# Patient Record
Sex: Male | Born: 1950 | Race: White | Hispanic: No | Smoking: Current every day smoker
Health system: Southern US, Community
[De-identification: ages and names within clinical notes are randomized; demographics above are authoritative.]

## PROBLEM LIST (undated history)

## (undated) DIAGNOSIS — I10 Essential (primary) hypertension: Secondary | ICD-10-CM

## (undated) DIAGNOSIS — J449 Chronic obstructive pulmonary disease, unspecified: Secondary | ICD-10-CM

## (undated) DIAGNOSIS — Z9981 Dependence on supplemental oxygen: Secondary | ICD-10-CM

## (undated) DIAGNOSIS — K219 Gastro-esophageal reflux disease without esophagitis: Secondary | ICD-10-CM

## (undated) DIAGNOSIS — J45909 Unspecified asthma, uncomplicated: Secondary | ICD-10-CM

## (undated) DIAGNOSIS — F17200 Nicotine dependence, unspecified, uncomplicated: Secondary | ICD-10-CM

## (undated) HISTORY — PX: HERNIA REPAIR: SHX51

## (undated) HISTORY — PX: NECK SURGERY: SHX720

---

## 2003-07-02 ENCOUNTER — Other Ambulatory Visit: Payer: Self-pay

## 2005-11-21 ENCOUNTER — Emergency Department: Payer: Self-pay | Admitting: Emergency Medicine

## 2009-06-18 ENCOUNTER — Emergency Department: Payer: Self-pay | Admitting: Emergency Medicine

## 2012-04-20 LAB — COMPREHENSIVE METABOLIC PANEL
BUN: 11 mg/dL (ref 7–18)
Chloride: 110 mmol/L — ABNORMAL HIGH (ref 98–107)
EGFR (African American): >60
EGFR (Non-African Amer.): >60
SGOT(AST): 36 U/L (ref 15–37)
SGPT (ALT): 39 U/L (ref 12–78)
Sodium: 141 mmol/L (ref 136–145)
Total Protein: 7.6 g/dL (ref 6.4–8.2)

## 2012-04-20 LAB — CBC
HCT: 42.2 % (ref 40.0–52.0)
HGB: 14.4 g/dL (ref 13.0–18.0)
MCH: 31.3 pg (ref 26.0–34.0)
MCHC: 34.1 g/dL (ref 32.0–36.0)
MCV: 92 fL (ref 80–100)
RBC: 4.61 10*6/uL (ref 4.40–5.90)
RDW: 13.6 % (ref 11.5–14.5)
WBC: 9.5 10*3/uL (ref 3.8–10.6)

## 2012-04-20 LAB — SEDIMENTATION RATE: Erythrocyte Sed Rate: 6 mm/hr (ref 0–20)

## 2012-04-20 LAB — TROPONIN I
Troponin-I: <0.02 ng/mL
Troponin-I: <0.02 ng/mL

## 2012-04-20 LAB — CK TOTAL AND CKMB (NOT AT ARMC)
CK, Total: 293 U/L — ABNORMAL HIGH (ref 35–232)
CK-MB: 6.3 ng/mL — ABNORMAL HIGH (ref 0.5–3.6)
CK-MB: 5.7 ng/mL — ABNORMAL HIGH (ref 0.5–3.6)

## 2012-04-20 LAB — APTT: Activated PTT: 30.4 secs (ref 23.6–35.9)

## 2012-04-20 LAB — PRO B NATRIURETIC PEPTIDE: B-Type Natriuretic Peptide: 21 pg/mL (ref 0–125)

## 2012-04-21 ENCOUNTER — Inpatient Hospital Stay: Payer: Self-pay | Admitting: Internal Medicine

## 2012-04-21 LAB — URINALYSIS, COMPLETE
Bacteria: NONE SEEN
Bilirubin,UR: NEGATIVE
Blood: NEGATIVE
Glucose,UR: NEGATIVE mg/dL (ref 0–75)
Leukocyte Esterase: NEGATIVE
Nitrite: NEGATIVE
Ph: 5 (ref 4.5–8.0)
Protein: NEGATIVE
RBC,UR: <1 /HPF (ref 0–5)

## 2012-04-22 LAB — BASIC METABOLIC PANEL
BUN: 19 mg/dL — ABNORMAL HIGH (ref 7–18)
Calcium, Total: 8.5 mg/dL (ref 8.5–10.1)
Creatinine: 0.86 mg/dL (ref 0.60–1.30)
EGFR (Non-African Amer.): >60
Glucose: 159 mg/dL — ABNORMAL HIGH (ref 65–99)
Potassium: 3.5 mmol/L (ref 3.5–5.1)
Sodium: 139 mmol/L (ref 136–145)

## 2012-08-04 ENCOUNTER — Inpatient Hospital Stay: Payer: Self-pay | Admitting: Family Medicine

## 2012-08-04 LAB — BASIC METABOLIC PANEL
Anion Gap: 3 — ABNORMAL LOW (ref 7–16)
BUN: 14 mg/dL (ref 7–18)
Chloride: 102 mmol/L (ref 98–107)
Co2: 31 mmol/L (ref 21–32)
Creatinine: 0.73 mg/dL (ref 0.60–1.30)
EGFR (Non-African Amer.): >60
Glucose: 97 mg/dL (ref 65–99)
Osmolality: 272 (ref 275–301)
Sodium: 136 mmol/L (ref 136–145)

## 2012-08-04 LAB — CK TOTAL AND CKMB (NOT AT ARMC): CK, Total: 134 U/L (ref 35–232)

## 2012-08-04 LAB — CBC
MCH: 31.2 pg (ref 26.0–34.0)
MCHC: 34.1 g/dL (ref 32.0–36.0)
WBC: 11.2 10*3/uL — ABNORMAL HIGH (ref 3.8–10.6)

## 2012-08-05 LAB — CBC WITH DIFFERENTIAL/PLATELET
Basophil #: 0 10*3/uL (ref 0.0–0.1)
Eosinophil %: 0 %
HCT: 39.6 % — ABNORMAL LOW (ref 40.0–52.0)
HGB: 13.3 g/dL (ref 13.0–18.0)
Lymphocyte #: 0.3 10*3/uL — ABNORMAL LOW (ref 1.0–3.6)
MCHC: 33.5 g/dL (ref 32.0–36.0)
Monocyte %: 0.7 %
Neutrophil %: 95.7 %
RBC: 4.38 10*6/uL — ABNORMAL LOW (ref 4.40–5.90)
WBC: 10.1 10*3/uL (ref 3.8–10.6)

## 2012-08-05 LAB — BASIC METABOLIC PANEL
BUN: 16 mg/dL (ref 7–18)
Co2: 27 mmol/L (ref 21–32)
Creatinine: 0.82 mg/dL (ref 0.60–1.30)
EGFR (African American): >60
EGFR (Non-African Amer.): >60
Glucose: 229 mg/dL — ABNORMAL HIGH (ref 65–99)
Osmolality: 275 (ref 275–301)

## 2012-08-07 LAB — CBC WITH DIFFERENTIAL/PLATELET
Basophil %: 0.1 %
Eosinophil %: 0 %
MCHC: 33.6 g/dL (ref 32.0–36.0)
MCV: 90 fL (ref 80–100)
Monocyte #: 0.4 x10 3/mm (ref 0.2–1.0)
RBC: 4.59 10*6/uL (ref 4.40–5.90)
RDW: 14.1 % (ref 11.5–14.5)

## 2012-08-07 LAB — BASIC METABOLIC PANEL
BUN: 23 mg/dL — ABNORMAL HIGH (ref 7–18)
Chloride: 97 mmol/L — ABNORMAL LOW (ref 98–107)
Creatinine: 0.8 mg/dL (ref 0.60–1.30)
Glucose: 166 mg/dL — ABNORMAL HIGH (ref 65–99)

## 2012-08-08 LAB — EXPECTORATED SPUTUM ASSESSMENT W GRAM STAIN, RFLX TO RESP C

## 2012-09-24 ENCOUNTER — Inpatient Hospital Stay: Payer: Self-pay | Admitting: Family Medicine

## 2012-09-24 LAB — BASIC METABOLIC PANEL
Anion Gap: 6 — ABNORMAL LOW (ref 7–16)
Calcium, Total: 9.5 mg/dL (ref 8.5–10.1)
Chloride: 103 mmol/L (ref 98–107)
Co2: 26 mmol/L (ref 21–32)
Creatinine: 0.54 mg/dL — ABNORMAL LOW (ref 0.60–1.30)
EGFR (Non-African Amer.): >60
Osmolality: 270 (ref 275–301)
Potassium: 4.1 mmol/L (ref 3.5–5.1)
Sodium: 135 mmol/L — ABNORMAL LOW (ref 136–145)

## 2012-09-24 LAB — CBC WITH DIFFERENTIAL/PLATELET
Basophil #: 0.1 10*3/uL (ref 0.0–0.1)
HCT: 43.5 % (ref 40.0–52.0)
Lymphocyte %: 15.7 %
MCH: 30.2 pg (ref 26.0–34.0)
MCHC: 34 g/dL (ref 32.0–36.0)
MCV: 89 fL (ref 80–100)
Neutrophil %: 71.1 %
Platelet: 387 10*3/uL (ref 150–440)
RBC: 4.91 10*6/uL (ref 4.40–5.90)
RDW: 14.1 % (ref 11.5–14.5)
WBC: 11.3 10*3/uL — ABNORMAL HIGH (ref 3.8–10.6)

## 2012-09-25 LAB — CBC WITH DIFFERENTIAL/PLATELET
Eosinophil #: 0 10*3/uL (ref 0.0–0.7)
Eosinophil %: 0 %
HGB: 13.1 g/dL (ref 13.0–18.0)
Lymphocyte #: 0.4 10*3/uL — ABNORMAL LOW (ref 1.0–3.6)
Lymphocyte %: 1.6 %
MCH: 29.5 pg (ref 26.0–34.0)
MCHC: 33.2 g/dL (ref 32.0–36.0)
MCV: 89 fL (ref 80–100)
Monocyte %: 3.1 %
Neutrophil #: 23.6 10*3/uL — ABNORMAL HIGH (ref 1.4–6.5)
Neutrophil %: 95.2 %
WBC: 24.8 10*3/uL — ABNORMAL HIGH (ref 3.8–10.6)

## 2012-09-25 LAB — BASIC METABOLIC PANEL
Anion Gap: 9 (ref 7–16)
BUN: 19 mg/dL — ABNORMAL HIGH (ref 7–18)
Calcium, Total: 8.9 mg/dL (ref 8.5–10.1)
Chloride: 103 mmol/L (ref 98–107)
Co2: 23 mmol/L (ref 21–32)
EGFR (African American): >60
Osmolality: 276 (ref 275–301)
Potassium: 4.3 mmol/L (ref 3.5–5.1)
Sodium: 135 mmol/L — ABNORMAL LOW (ref 136–145)

## 2012-09-25 LAB — TROPONIN I
Troponin-I: <0.02 ng/mL
Troponin-I: <0.02 ng/mL

## 2012-09-28 ENCOUNTER — Ambulatory Visit: Payer: Self-pay | Admitting: Orthopedic Surgery

## 2012-09-28 LAB — BASIC METABOLIC PANEL
Anion Gap: 4 — ABNORMAL LOW (ref 7–16)
Creatinine: 0.73 mg/dL (ref 0.60–1.30)
EGFR (Non-African Amer.): >60
Glucose: 103 mg/dL — ABNORMAL HIGH (ref 65–99)
Potassium: 4.6 mmol/L (ref 3.5–5.1)
Sodium: 132 mmol/L — ABNORMAL LOW (ref 136–145)

## 2012-10-23 ENCOUNTER — Emergency Department: Payer: Self-pay | Admitting: Emergency Medicine

## 2012-10-23 LAB — CBC
HCT: 40.4 % (ref 40.0–52.0)
HGB: 14.2 g/dL (ref 13.0–18.0)
MCV: 88 fL (ref 80–100)
Platelet: 408 10*3/uL (ref 150–440)
RBC: 4.61 10*6/uL (ref 4.40–5.90)
RDW: 14.6 % — ABNORMAL HIGH (ref 11.5–14.5)

## 2012-10-23 LAB — COMPREHENSIVE METABOLIC PANEL
Albumin: 3.8 g/dL (ref 3.4–5.0)
Alkaline Phosphatase: 130 U/L (ref 50–136)
Anion Gap: 6 — ABNORMAL LOW (ref 7–16)
Bilirubin,Total: 0.5 mg/dL (ref 0.2–1.0)
Calcium, Total: 9 mg/dL (ref 8.5–10.1)
Co2: 25 mmol/L (ref 21–32)
Creatinine: 0.82 mg/dL (ref 0.60–1.30)
EGFR (African American): >60
SGOT(AST): 22 U/L (ref 15–37)
SGPT (ALT): 23 U/L (ref 12–78)
Sodium: 135 mmol/L — ABNORMAL LOW (ref 136–145)

## 2012-10-23 LAB — CK TOTAL AND CKMB (NOT AT ARMC): CK, Total: 263 U/L — ABNORMAL HIGH (ref 35–232)

## 2012-10-23 LAB — TROPONIN I: Troponin-I: <0.02 ng/mL

## 2013-02-06 ENCOUNTER — Inpatient Hospital Stay: Payer: Self-pay | Admitting: Internal Medicine

## 2013-02-06 LAB — BASIC METABOLIC PANEL
Anion Gap: 6 — ABNORMAL LOW (ref 7–16)
BUN: 8 mg/dL (ref 7–18)
Calcium, Total: 9.4 mg/dL (ref 8.5–10.1)
Chloride: 100 mmol/L (ref 98–107)
Co2: 26 mmol/L (ref 21–32)
EGFR (African American): >60
EGFR (Non-African Amer.): >60
Glucose: 87 mg/dL (ref 65–99)
Osmolality: 262 (ref 275–301)
Potassium: 4.2 mmol/L (ref 3.5–5.1)

## 2013-02-06 LAB — CBC
HGB: 15.1 g/dL (ref 13.0–18.0)
MCH: 31.1 pg (ref 26.0–34.0)
MCHC: 34.7 g/dL (ref 32.0–36.0)
MCV: 90 fL (ref 80–100)
WBC: 10.1 10*3/uL (ref 3.8–10.6)

## 2013-02-06 LAB — PRO B NATRIURETIC PEPTIDE: B-Type Natriuretic Peptide: 100 pg/mL (ref 0–125)

## 2013-02-06 LAB — HEPATIC FUNCTION PANEL A (ARMC)
Alkaline Phosphatase: 132 U/L (ref 50–136)
Bilirubin, Direct: 0.2 mg/dL (ref 0.00–0.20)
SGOT(AST): 26 U/L (ref 15–37)
Total Protein: 7.4 g/dL (ref 6.4–8.2)

## 2013-02-07 LAB — BASIC METABOLIC PANEL
Anion Gap: 6 — ABNORMAL LOW (ref 7–16)
BUN: 10 mg/dL (ref 7–18)
Chloride: 101 mmol/L (ref 98–107)
Co2: 25 mmol/L (ref 21–32)
EGFR (Non-African Amer.): >60
Sodium: 132 mmol/L — ABNORMAL LOW (ref 136–145)

## 2013-02-07 LAB — CBC WITH DIFFERENTIAL/PLATELET
Basophil #: 0 10*3/uL (ref 0.0–0.1)
Basophil %: 0.3 %
Eosinophil #: 0 10*3/uL (ref 0.0–0.7)
Eosinophil %: 0 %
Lymphocyte #: 0.5 10*3/uL — ABNORMAL LOW (ref 1.0–3.6)
Lymphocyte %: 5.9 %
MCH: 30.6 pg (ref 26.0–34.0)
Monocyte %: 0.9 %
Neutrophil #: 7.6 10*3/uL — ABNORMAL HIGH (ref 1.4–6.5)
Neutrophil %: 92.9 %
RBC: 4.82 10*6/uL (ref 4.40–5.90)
WBC: 8.2 10*3/uL (ref 3.8–10.6)

## 2013-02-08 LAB — BASIC METABOLIC PANEL
Anion Gap: 5 — ABNORMAL LOW (ref 7–16)
EGFR (African American): >60
EGFR (Non-African Amer.): >60
Glucose: 149 mg/dL — ABNORMAL HIGH (ref 65–99)
Osmolality: 272 (ref 275–301)
Sodium: 133 mmol/L — ABNORMAL LOW (ref 136–145)

## 2013-02-09 LAB — BASIC METABOLIC PANEL
Anion Gap: 8 (ref 7–16)
BUN: 25 mg/dL — ABNORMAL HIGH (ref 7–18)
Chloride: 99 mmol/L (ref 98–107)
Co2: 26 mmol/L (ref 21–32)
Creatinine: 0.93 mg/dL (ref 0.60–1.30)
Potassium: 4.3 mmol/L (ref 3.5–5.1)
Sodium: 133 mmol/L — ABNORMAL LOW (ref 136–145)

## 2013-02-11 LAB — CULTURE, BLOOD (SINGLE)

## 2013-07-31 ENCOUNTER — Emergency Department: Payer: Self-pay | Admitting: Emergency Medicine

## 2013-07-31 LAB — BASIC METABOLIC PANEL
Anion Gap: 5 — ABNORMAL LOW (ref 7–16)
BUN: 8 mg/dL (ref 7–18)
CALCIUM: 8.9 mg/dL (ref 8.5–10.1)
CO2: 28 mmol/L (ref 21–32)
CREATININE: 0.83 mg/dL (ref 0.60–1.30)
Chloride: 97 mmol/L — ABNORMAL LOW (ref 98–107)
EGFR (African American): >60
EGFR (Non-African Amer.): >60
GLUCOSE: 84 mg/dL (ref 65–99)
OSMOLALITY: 258 (ref 275–301)
Potassium: 4 mmol/L (ref 3.5–5.1)
Sodium: 130 mmol/L — ABNORMAL LOW (ref 136–145)

## 2013-07-31 LAB — CBC
HCT: 46.6 % (ref 40.0–52.0)
HGB: 15.5 g/dL (ref 13.0–18.0)
MCH: 30.3 pg (ref 26.0–34.0)
MCHC: 33.3 g/dL (ref 32.0–36.0)
MCV: 91 fL (ref 80–100)
PLATELETS: 325 10*3/uL (ref 150–440)
RBC: 5.11 10*6/uL (ref 4.40–5.90)
RDW: 14.2 % (ref 11.5–14.5)
WBC: 8.2 10*3/uL (ref 3.8–10.6)

## 2013-07-31 LAB — CK: CK, TOTAL: 187 U/L

## 2013-07-31 LAB — PRO B NATRIURETIC PEPTIDE: B-TYPE NATIURETIC PEPTID: 19 pg/mL (ref 0–125)

## 2013-07-31 LAB — TROPONIN I: Troponin-I: <0.02 ng/mL

## 2013-07-31 LAB — CK-MB: CK-MB: 3.9 ng/mL — AB (ref 0.5–3.6)

## 2013-08-04 ENCOUNTER — Emergency Department: Payer: Self-pay | Admitting: Emergency Medicine

## 2013-08-04 LAB — COMPREHENSIVE METABOLIC PANEL
AST: 27 U/L (ref 15–37)
Albumin: 3.9 g/dL (ref 3.4–5.0)
Alkaline Phosphatase: 132 U/L — ABNORMAL HIGH
Anion Gap: 5 — ABNORMAL LOW (ref 7–16)
BUN: 14 mg/dL (ref 7–18)
Bilirubin,Total: 0.6 mg/dL (ref 0.2–1.0)
Calcium, Total: 8.9 mg/dL (ref 8.5–10.1)
Chloride: 103 mmol/L (ref 98–107)
Co2: 27 mmol/L (ref 21–32)
Creatinine: 0.93 mg/dL (ref 0.60–1.30)
EGFR (African American): >60
GLUCOSE: 87 mg/dL (ref 65–99)
Osmolality: 270 (ref 275–301)
Potassium: 4.1 mmol/L (ref 3.5–5.1)
SGPT (ALT): 37 U/L (ref 12–78)
SODIUM: 135 mmol/L — AB (ref 136–145)
Total Protein: 7.7 g/dL (ref 6.4–8.2)

## 2013-08-04 LAB — TROPONIN I

## 2013-08-04 LAB — CBC
HCT: 43.3 % (ref 40.0–52.0)
HGB: 14.1 g/dL (ref 13.0–18.0)
MCH: 29.8 pg (ref 26.0–34.0)
MCHC: 32.7 g/dL (ref 32.0–36.0)
MCV: 91 fL (ref 80–100)
PLATELETS: 328 10*3/uL (ref 150–440)
RBC: 4.75 10*6/uL (ref 4.40–5.90)
RDW: 14.1 % (ref 11.5–14.5)
WBC: 11.2 10*3/uL — AB (ref 3.8–10.6)

## 2013-08-04 LAB — PRO B NATRIURETIC PEPTIDE: B-TYPE NATIURETIC PEPTID: 70 pg/mL (ref 0–125)

## 2013-09-01 ENCOUNTER — Emergency Department: Payer: Self-pay | Admitting: Emergency Medicine

## 2013-09-01 LAB — CBC
HCT: 44.8 % (ref 40.0–52.0)
HGB: 14.9 g/dL (ref 13.0–18.0)
MCH: 30.4 pg (ref 26.0–34.0)
MCHC: 33.2 g/dL (ref 32.0–36.0)
MCV: 92 fL (ref 80–100)
PLATELETS: 381 10*3/uL (ref 150–440)
RBC: 4.89 10*6/uL (ref 4.40–5.90)
RDW: 14.6 % — AB (ref 11.5–14.5)
WBC: 10.6 10*3/uL (ref 3.8–10.6)

## 2013-09-01 LAB — BASIC METABOLIC PANEL
Anion Gap: 4 — ABNORMAL LOW (ref 7–16)
BUN: 15 mg/dL (ref 7–18)
Calcium, Total: 9 mg/dL (ref 8.5–10.1)
Chloride: 102 mmol/L (ref 98–107)
Co2: 30 mmol/L (ref 21–32)
Creatinine: 0.63 mg/dL (ref 0.60–1.30)
GLUCOSE: 91 mg/dL (ref 65–99)
OSMOLALITY: 272 (ref 275–301)
POTASSIUM: 4.2 mmol/L (ref 3.5–5.1)
SODIUM: 136 mmol/L (ref 136–145)

## 2013-09-01 LAB — TROPONIN I: Troponin-I: <0.02 ng/mL

## 2013-09-09 ENCOUNTER — Emergency Department: Payer: Self-pay | Admitting: Emergency Medicine

## 2013-09-09 LAB — CBC
HCT: 45.6 % (ref 40.0–52.0)
HGB: 15 g/dL (ref 13.0–18.0)
MCH: 30.1 pg (ref 26.0–34.0)
MCHC: 32.9 g/dL (ref 32.0–36.0)
MCV: 92 fL (ref 80–100)
PLATELETS: 328 10*3/uL (ref 150–440)
RBC: 4.98 10*6/uL (ref 4.40–5.90)
RDW: 14.6 % — ABNORMAL HIGH (ref 11.5–14.5)
WBC: 13.4 10*3/uL — AB (ref 3.8–10.6)

## 2013-09-09 LAB — COMPREHENSIVE METABOLIC PANEL
ALBUMIN: 3.8 g/dL (ref 3.4–5.0)
ALT: 36 U/L (ref 12–78)
ANION GAP: 6 — AB (ref 7–16)
AST: 17 U/L (ref 15–37)
Alkaline Phosphatase: 146 U/L — ABNORMAL HIGH
BUN: 14 mg/dL (ref 7–18)
Bilirubin,Total: 0.5 mg/dL (ref 0.2–1.0)
Calcium, Total: 9.6 mg/dL (ref 8.5–10.1)
Chloride: 101 mmol/L (ref 98–107)
Co2: 29 mmol/L (ref 21–32)
Creatinine: 0.68 mg/dL (ref 0.60–1.30)
EGFR (African American): >60
EGFR (Non-African Amer.): >60
Glucose: 80 mg/dL (ref 65–99)
Osmolality: 271 (ref 275–301)
Potassium: 4.4 mmol/L (ref 3.5–5.1)
Sodium: 136 mmol/L (ref 136–145)
TOTAL PROTEIN: 7.5 g/dL (ref 6.4–8.2)

## 2013-09-09 LAB — TROPONIN I: Troponin-I: <0.02 ng/mL

## 2013-09-09 LAB — CK TOTAL AND CKMB (NOT AT ARMC)
CK, TOTAL: 111 U/L
CK-MB: 4.2 ng/mL — ABNORMAL HIGH (ref 0.5–3.6)

## 2013-09-12 ENCOUNTER — Inpatient Hospital Stay: Payer: Self-pay | Admitting: Student

## 2013-09-12 LAB — COMPREHENSIVE METABOLIC PANEL
Albumin: 3.7 g/dL (ref 3.4–5.0)
Alkaline Phosphatase: 132 U/L — ABNORMAL HIGH
Anion Gap: 2 — ABNORMAL LOW (ref 7–16)
BUN: 21 mg/dL — ABNORMAL HIGH (ref 7–18)
Bilirubin,Total: 0.3 mg/dL (ref 0.2–1.0)
CHLORIDE: 102 mmol/L (ref 98–107)
CREATININE: 0.92 mg/dL (ref 0.60–1.30)
Calcium, Total: 9 mg/dL (ref 8.5–10.1)
Co2: 31 mmol/L (ref 21–32)
EGFR (African American): >60
Glucose: 88 mg/dL (ref 65–99)
Osmolality: 272 (ref 275–301)
Potassium: 4.3 mmol/L (ref 3.5–5.1)
SGOT(AST): 24 U/L (ref 15–37)
SGPT (ALT): 37 U/L (ref 12–78)
Sodium: 135 mmol/L — ABNORMAL LOW (ref 136–145)
Total Protein: 7.4 g/dL (ref 6.4–8.2)

## 2013-09-12 LAB — URINALYSIS, COMPLETE
BILIRUBIN, UR: NEGATIVE
Bacteria: NONE SEEN
Blood: NEGATIVE
Glucose,UR: NEGATIVE mg/dL (ref 0–75)
Ketone: NEGATIVE
LEUKOCYTE ESTERASE: NEGATIVE
NITRITE: NEGATIVE
Ph: 6 (ref 4.5–8.0)
Protein: NEGATIVE
RBC,UR: <1 /HPF (ref 0–5)
Specific Gravity: 1.01 (ref 1.003–1.030)
Squamous Epithelial: NONE SEEN
WBC UR: <1 /HPF (ref 0–5)

## 2013-09-12 LAB — TROPONIN I
Troponin-I: <0.02 ng/mL
Troponin-I: <0.02 ng/mL

## 2013-09-12 LAB — CBC
HCT: 45.5 % (ref 40.0–52.0)
HGB: 14.9 g/dL (ref 13.0–18.0)
MCH: 30.2 pg (ref 26.0–34.0)
MCHC: 32.8 g/dL (ref 32.0–36.0)
MCV: 92 fL (ref 80–100)
Platelet: 303 10*3/uL (ref 150–440)
RBC: 4.95 10*6/uL (ref 4.40–5.90)
RDW: 14.7 % — AB (ref 11.5–14.5)
WBC: 15.7 10*3/uL — AB (ref 3.8–10.6)

## 2013-09-12 LAB — PRO B NATRIURETIC PEPTIDE: B-Type Natriuretic Peptide: 71 pg/mL (ref 0–125)

## 2013-09-13 LAB — CBC WITH DIFFERENTIAL/PLATELET
Basophil #: 0 10*3/uL (ref 0.0–0.1)
Basophil %: 0.1 %
Eosinophil #: 0 10*3/uL (ref 0.0–0.7)
Eosinophil %: 0 %
HCT: 42.8 % (ref 40.0–52.0)
HGB: 14.1 g/dL (ref 13.0–18.0)
LYMPHS ABS: 0.7 10*3/uL — AB (ref 1.0–3.6)
LYMPHS PCT: 4.4 %
MCH: 30.1 pg (ref 26.0–34.0)
MCHC: 32.9 g/dL (ref 32.0–36.0)
MCV: 92 fL (ref 80–100)
Monocyte #: 0.2 x10 3/mm (ref 0.2–1.0)
Monocyte %: 1.4 %
NEUTROS ABS: 14.9 10*3/uL — AB (ref 1.4–6.5)
Neutrophil %: 94.1 %
Platelet: 292 10*3/uL (ref 150–440)
RBC: 4.68 10*6/uL (ref 4.40–5.90)
RDW: 15 % — AB (ref 11.5–14.5)
WBC: 15.8 10*3/uL — ABNORMAL HIGH (ref 3.8–10.6)

## 2013-09-13 LAB — COMPREHENSIVE METABOLIC PANEL
ALBUMIN: 3.3 g/dL — AB (ref 3.4–5.0)
ALT: 37 U/L (ref 12–78)
Alkaline Phosphatase: 115 U/L
Anion Gap: 6 — ABNORMAL LOW (ref 7–16)
BUN: 24 mg/dL — AB (ref 7–18)
Bilirubin,Total: 0.2 mg/dL (ref 0.2–1.0)
CALCIUM: 8.8 mg/dL (ref 8.5–10.1)
Chloride: 103 mmol/L (ref 98–107)
Co2: 26 mmol/L (ref 21–32)
Creatinine: 1 mg/dL (ref 0.60–1.30)
Glucose: 158 mg/dL — ABNORMAL HIGH (ref 65–99)
OSMOLALITY: 277 (ref 275–301)
Potassium: 4.3 mmol/L (ref 3.5–5.1)
SGOT(AST): 26 U/L (ref 15–37)
SODIUM: 135 mmol/L — AB (ref 136–145)
Total Protein: 6.8 g/dL (ref 6.4–8.2)

## 2013-09-15 LAB — CBC WITH DIFFERENTIAL/PLATELET
BASOS ABS: 0 10*3/uL (ref 0.0–0.1)
BASOS PCT: 0.1 %
EOS PCT: 0 %
Eosinophil #: 0 10*3/uL (ref 0.0–0.7)
HCT: 44.1 % (ref 40.0–52.0)
HGB: 14.4 g/dL (ref 13.0–18.0)
LYMPHS ABS: 0.8 10*3/uL — AB (ref 1.0–3.6)
Lymphocyte %: 3.3 %
MCH: 30 pg (ref 26.0–34.0)
MCHC: 32.7 g/dL (ref 32.0–36.0)
MCV: 92 fL (ref 80–100)
MONO ABS: 0.6 x10 3/mm (ref 0.2–1.0)
Monocyte %: 2.4 %
NEUTROS PCT: 94.2 %
Neutrophil #: 23.2 10*3/uL — ABNORMAL HIGH (ref 1.4–6.5)
Platelet: 292 10*3/uL (ref 150–440)
RBC: 4.82 10*6/uL (ref 4.40–5.90)
RDW: 14.8 % — ABNORMAL HIGH (ref 11.5–14.5)
WBC: 24.6 10*3/uL — ABNORMAL HIGH (ref 3.8–10.6)

## 2013-09-16 LAB — CBC WITH DIFFERENTIAL/PLATELET
Basophil #: 0.3 10*3/uL — ABNORMAL HIGH (ref 0.0–0.1)
Basophil %: 1.5 %
Eosinophil #: 0 10*3/uL (ref 0.0–0.7)
Eosinophil %: 0 %
HCT: 43.5 % (ref 40.0–52.0)
HGB: 14.7 g/dL (ref 13.0–18.0)
LYMPHS ABS: 1 10*3/uL (ref 1.0–3.6)
Lymphocyte %: 4.5 %
MCH: 30.8 pg (ref 26.0–34.0)
MCHC: 33.8 g/dL (ref 32.0–36.0)
MCV: 91 fL (ref 80–100)
Monocyte #: 0.9 x10 3/mm (ref 0.2–1.0)
Monocyte %: 3.8 %
NEUTROS ABS: 20.3 10*3/uL — AB (ref 1.4–6.5)
NEUTROS PCT: 90.2 %
PLATELETS: 290 10*3/uL (ref 150–440)
RBC: 4.77 10*6/uL (ref 4.40–5.90)
RDW: 14.6 % — ABNORMAL HIGH (ref 11.5–14.5)
WBC: 22.5 10*3/uL — ABNORMAL HIGH (ref 3.8–10.6)

## 2013-09-17 LAB — CBC WITH DIFFERENTIAL/PLATELET
Basophil #: 0 10*3/uL (ref 0.0–0.1)
Basophil %: 0.2 %
EOS PCT: 0 %
Eosinophil #: 0 10*3/uL (ref 0.0–0.7)
HCT: 45.1 % (ref 40.0–52.0)
HGB: 15.1 g/dL (ref 13.0–18.0)
LYMPHS ABS: 0.9 10*3/uL — AB (ref 1.0–3.6)
Lymphocyte %: 5.3 %
MCH: 30.5 pg (ref 26.0–34.0)
MCHC: 33.4 g/dL (ref 32.0–36.0)
MCV: 92 fL (ref 80–100)
MONO ABS: 0.5 x10 3/mm (ref 0.2–1.0)
Monocyte %: 2.9 %
NEUTROS ABS: 15.5 10*3/uL — AB (ref 1.4–6.5)
Neutrophil %: 91.6 %
Platelet: 303 10*3/uL (ref 150–440)
RBC: 4.93 10*6/uL (ref 4.40–5.90)
RDW: 14.8 % — ABNORMAL HIGH (ref 11.5–14.5)
WBC: 16.9 10*3/uL — ABNORMAL HIGH (ref 3.8–10.6)

## 2013-09-17 LAB — CULTURE, BLOOD (SINGLE)

## 2013-09-18 LAB — CREATININE, SERUM
Creatinine: 0.81 mg/dL (ref 0.60–1.30)
EGFR (Non-African Amer.): >60

## 2013-09-30 ENCOUNTER — Inpatient Hospital Stay: Payer: Self-pay | Admitting: Internal Medicine

## 2013-09-30 LAB — BASIC METABOLIC PANEL
Anion Gap: 8 (ref 7–16)
BUN: 11 mg/dL (ref 7–18)
CALCIUM: 9 mg/dL (ref 8.5–10.1)
Chloride: 100 mmol/L (ref 98–107)
Co2: 25 mmol/L (ref 21–32)
Creatinine: 0.79 mg/dL (ref 0.60–1.30)
EGFR (Non-African Amer.): >60
Glucose: 97 mg/dL (ref 65–99)
Osmolality: 266 (ref 275–301)
POTASSIUM: 4.1 mmol/L (ref 3.5–5.1)
SODIUM: 133 mmol/L — AB (ref 136–145)

## 2013-09-30 LAB — CBC
HCT: 41.8 % (ref 40.0–52.0)
HGB: 14.1 g/dL (ref 13.0–18.0)
MCH: 30.3 pg (ref 26.0–34.0)
MCHC: 33.7 g/dL (ref 32.0–36.0)
MCV: 90 fL (ref 80–100)
PLATELETS: 308 10*3/uL (ref 150–440)
RBC: 4.65 10*6/uL (ref 4.40–5.90)
RDW: 14.2 % (ref 11.5–14.5)
WBC: 8.3 10*3/uL (ref 3.8–10.6)

## 2013-09-30 LAB — TROPONIN I
Troponin-I: <0.02 ng/mL
Troponin-I: <0.02 ng/mL

## 2013-09-30 LAB — D-DIMER(ARMC): D-DIMER: 786 ng/mL

## 2013-10-01 LAB — COMPREHENSIVE METABOLIC PANEL
ALK PHOS: 109 U/L
ALT: 40 U/L (ref 12–78)
ANION GAP: 10 (ref 7–16)
AST: 18 U/L (ref 15–37)
Albumin: 3.3 g/dL — ABNORMAL LOW (ref 3.4–5.0)
BUN: 14 mg/dL (ref 7–18)
Bilirubin,Total: 0.3 mg/dL (ref 0.2–1.0)
CALCIUM: 9.5 mg/dL (ref 8.5–10.1)
CO2: 22 mmol/L (ref 21–32)
CREATININE: 0.87 mg/dL (ref 0.60–1.30)
Chloride: 99 mmol/L (ref 98–107)
GLUCOSE: 248 mg/dL — AB (ref 65–99)
Osmolality: 271 (ref 275–301)
Potassium: 4.4 mmol/L (ref 3.5–5.1)
Sodium: 131 mmol/L — ABNORMAL LOW (ref 136–145)
Total Protein: 7 g/dL (ref 6.4–8.2)

## 2013-10-01 LAB — TROPONIN I: Troponin-I: <0.02 ng/mL

## 2013-10-01 LAB — CBC WITH DIFFERENTIAL/PLATELET
BASOS PCT: 0.2 %
Basophil #: 0 10*3/uL (ref 0.0–0.1)
EOS ABS: 0 10*3/uL (ref 0.0–0.7)
Eosinophil %: 0 %
HCT: 40.7 % (ref 40.0–52.0)
HGB: 13.6 g/dL (ref 13.0–18.0)
LYMPHS PCT: 3.3 %
Lymphocyte #: 0.2 10*3/uL — ABNORMAL LOW (ref 1.0–3.6)
MCH: 30.2 pg (ref 26.0–34.0)
MCHC: 33.4 g/dL (ref 32.0–36.0)
MCV: 91 fL (ref 80–100)
MONO ABS: 0 x10 3/mm — AB (ref 0.2–1.0)
MONOS PCT: 0.5 %
Neutrophil #: 6.1 10*3/uL (ref 1.4–6.5)
Neutrophil %: 96 %
PLATELETS: 308 10*3/uL (ref 150–440)
RBC: 4.49 10*6/uL (ref 4.40–5.90)
RDW: 14.1 % (ref 11.5–14.5)
WBC: 6.3 10*3/uL (ref 3.8–10.6)

## 2013-10-02 LAB — BASIC METABOLIC PANEL
ANION GAP: 6 — AB (ref 7–16)
BUN: 19 mg/dL — ABNORMAL HIGH (ref 7–18)
Calcium, Total: 9.1 mg/dL (ref 8.5–10.1)
Chloride: 104 mmol/L (ref 98–107)
Co2: 26 mmol/L (ref 21–32)
Creatinine: 0.83 mg/dL (ref 0.60–1.30)
GLUCOSE: 152 mg/dL — AB (ref 65–99)
Osmolality: 277 (ref 275–301)
Potassium: 4.4 mmol/L (ref 3.5–5.1)
SODIUM: 136 mmol/L (ref 136–145)

## 2014-05-27 IMAGING — CR DG CHEST 1V PORT
1 series · 1 of 1 positions shown · non-contrast
Comparison: 09/09/2013

CLINICAL DATA: Shortness of breath and chest pain.

EXAM:
PORTABLE CHEST - 1 VIEW

[ap]
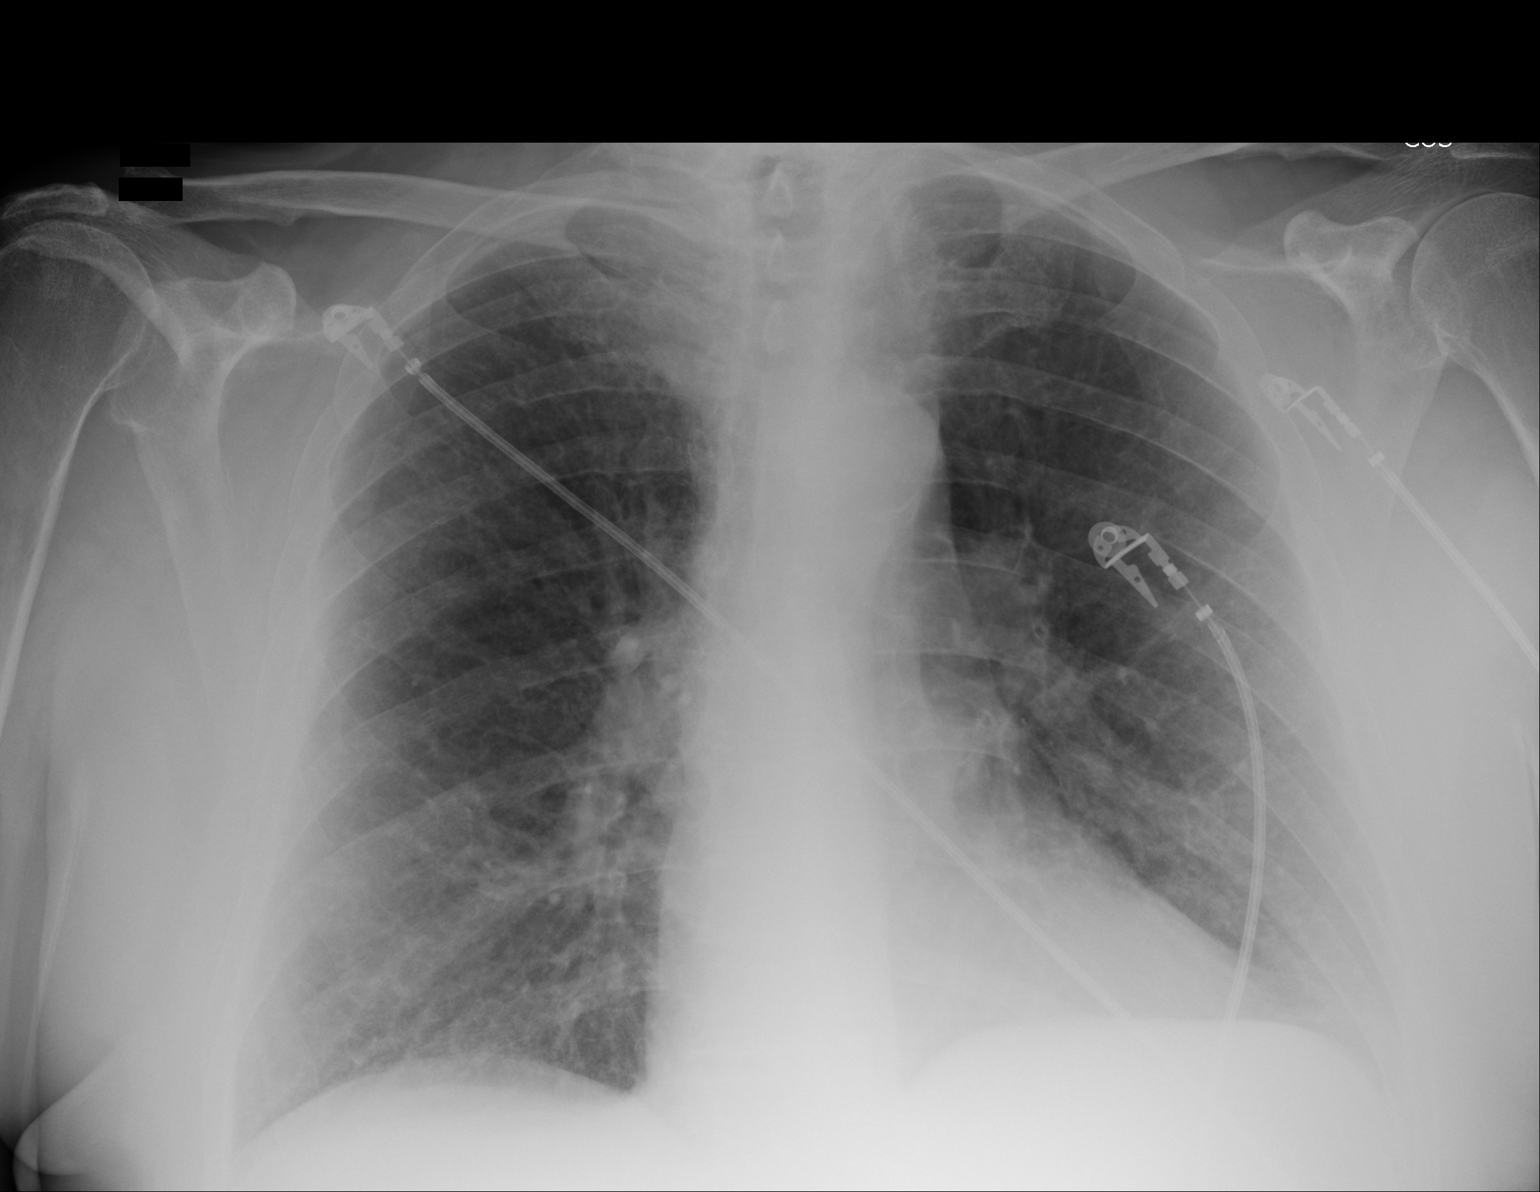

[1 of 1 positions shown; findings below may reference images not displayed]

FINDINGS: Cardiomediastinal silhouette is within normal limits. The patient
has taken a greater inspiration than on the prior study. There is
improved aeration of the right lung base with interval near complete
resolution of opacity on the prior study. There is no evidence of
new airspace consolidation. No pleural effusion or pneumothorax is
identified. There may be mild pulmonary vascular congestion without
overt edema. No acute osseous abnormality is seen.
IMPRESSION: Greater inspiration with improved aeration of the right lung base.

## 2014-08-25 NOTE — Consult Note (Signed)
PATIENT NAME:  Jeremiah [name], Jeremiah [name] MR#:  [id number] DATE OF BIRTH:  1951-01-29  DATE OF CONSULTATION:  04/23/2012  REFERRING PHYSICIAN:   CONSULTING PHYSICIAN:  Gerrit Heck. Joselinne Lawal, DPM  REASON FOR CONSULTATION:  Multiple skin issues on both feet.   HISTORY OF PRESENT ILLNESS:  He was admitted for excessive edema and cellulitis to both legs. He has been on some IV antibiotics and some diuretics, trying to get rid of some of this excessive swelling. He was admitted several days ago and has gradually improved with his legs and with the swelling in these regions. He is improved some with that, however, he still has issues with both his feet as far as excessively dry skin and some fissuring on both feet. He was also admitted for some shortness of breath. He states he is a chronic smoker, 2 packs a day.   PAST MEDICAL HISTORY: Fairly unremarkable. Denies any history of heart or pulmonary problems.   PAST SURGICAL HISTORY:  Includes a ventral hernia repair in 1992 and then also in 2003.   SOCIAL HABITS:  He is a chronic 2-pack-a-day smoker for 25 years. Drinks beer, 6-pack he states last him for several weeks. Denies any other drug abuse.   PHYSICAL EXAMINATION:   GENERAL:  Alert, well oriented 64 year old male.  VITAL SIGNS:  Temperature is 97, pulse 82, respirations 18, blood pressure is 126/75, pulse ox is 98%.  CHEST:  He denies any chest pain at this point, has shortness of breath associated with his heavy smoking.  LOWER EXTREMITY EXAM: Vascular DP pulses are +1/4 bilaterally, a little difficult to palpate because he does have some edema still in his feet and legs. He has some mild cellulitis to his feet and legs at this point, apparently it is a lot better than it was a couple of days ago when he had more problems with it and had some draining blister-type formations on both legs. Some varicose veins were noted, even with his legs up.  DERMATOLOGIC: Patient has significant xerosis to both feet, a lot  of drying to his skin and nails. He has fissuring on the plantar right hallux with an open fissure on that region as well as fissuring on the left fifth toe region plantarly. These are both at the sulcus region. No evidence really of infection around the region, just extensive dry skin, some elongation of the toenails, but I do not think they look terribly mycotic in nature. Likely some mycosis to the hallux nails but those were about the only ones.   CLINICAL IMPRESSION: Xerosis with fissuring to both feet, chronic venostasis disease and edema which contributes to the xerosis. Open fissures bilaterally.   TREATMENT PLAN: I will cut back some of these elongated toenails with some scissors but I can do a better job in the office if he comes into the office, but it may be hard to get him there since he does not have a lot of medical support, does not have a family doctor and has no insurance at this point. He needs better control of the swelling in his legs. He needs to utilize compression stockings.  I will order some knee-high TED hose for him today and also recommended he go to the [employer] to buy some over-the-counter compression stockings to try to keep the edema under better control. Also, he needs Neosporin and either Band-Aids or Telfa on the fissure areas for probably a week to get those healed up. He also needs  a good moisturizing and debriding cream to get rid of some extensive xerosis and dry skin to both feet, something like Ureacin-40 would be recommended. He needs to put this on twice a day and he needs to keep his feet covered. I would recommend he wear socks, not go barefooted as this will contribute to his overall drying if he does. As noted, I do not think he has got a lot of infectious process going, it is more associated with dyshydrosis and xerosis associated with his significant edema in both feet and legs. I will be happy to follow him at some point in the office if he makes an  appointment.      ____________________________ Gerrit Heck. Saraiah Bhat, DPM mgt:cs D: 04/23/2012 13:33:22 ET T: 04/23/2012 19:44:17 ET JOB#: [id number]  cc: Gerrit Heck Destani Wamser, DPM, <Dictator> Perry Mount MD ELECTRONICALLY SIGNED 04/25/2012 12:34

## 2014-08-25 NOTE — Discharge Summary (Signed)
PATIENT NAME:  Jeremiah [name], Jeremiah [name] MR#:  [id number] DATE OF BIRTH:  09/02/1950  DATE OF ADMISSION:  04/21/2012 DATE OF DISCHARGE:  04/24/2012  DISCHARGE DIAGNOSES: 1. Left leg cellulitis with dry skin of the foot. 2. Hypertension. 3. Chronic obstructive pulmonary disease.  4. Chronic venous stasis of foot.  DISCHARGE MEDICATIONS: 1. Combivent 1 puff twice a day. 2. Spiriva 1 capsule inhalation daily.  3. Nystatin powder apply topically to affected area three times a day.  4. Lasix 20 mg p.o. daily.  5. KCl 20 mEq p.o. daily.  6. NicoDerm 10 mg inhalation every 2 hours as needed to curb nicotine craving. 7. Cleocin 300 mg one capsule q. 6 hours. 8. Bacitracin with neomycin and polymyxin apply to leg twice a day. 9. Uracein-40 apply 2 grams topically twice a day to leg.  CONSULTATIONS: Albertine Patricia, DPM - Podiatry.   DISCHARGE INSTRUCTOINS: The patient is given clindamycin for 6 more days. Case manager is helping with medications and does not have any insurance. He will follow up with the [facility].   HOSPITAL COURSE: The patient is a 64 year old male with history of tobacco abuse who came in because of left leg swelling and overall generalized swelling in the legs and trouble breathing. The patient had no fever, but he had redness and swelling of the left leg. He was admitted to the hospitalist service for left leg cellulitis. Ultrasound of the leg did not show any DVT. He did not have any fever on admission. He was started on clindamycin IV and the patient was also started on Lasix for the leg edema and anasarca. The patient's saturations were 95% on 2 liters. The patient's swelling improved and redness improved. The patient was seen by Dr. Elvina Mattes who felt that he has dry and cracked skin due to chronic venostasis. He has stockings along with Ureacin-40 cream. The patient was sent home with Ureacin cream that he used in the hospital. The patient's toenails were also cut by him at the  bedside. He is advised to use polymyxin and Neosporin lotion. The patient  work for [employer] and stands for almost 10 hours today. Advised him to wear the socks. He has shortness of breath thought to be secondary to COPD. The patient was started on Combivent and Spiriva. The patient's O2 saturations were checked on room air and are 95%. The patient is advised to continue them and he asked for Nicotrol inhaler which we gave and he is trying to quit.   Hypertension with diastolic heart failure on Echo. He can take the Lasix and KCL as directed. His echo showed ejection fraction more 55% with normal flow pattern.   X-ray of the left foot did not show any osteomyelitis so he is discharged with clindamycin and also nystatin ointment and Ureacin-40 for his legs. He is given Lasix and potassium for his heart and trouble breathing. He is advised to quit smoking and given a prescription for Nicotrol inhaler and also a prescription for Spiriva and Albuterol. The patient is sent home.  TIME SPENT ON DISCHARGE PREPARATION: More than 30 minutes. ____________________________ Epifanio Lesches, MD sk:sb D: 04/25/2012 22:44:44 ET T: 04/26/2012 10:44:51 ET JOB#: [id number]  cc: Epifanio Lesches, MD, <Dictator> Epifanio Lesches MD ELECTRONICALLY SIGNED 04/28/2012 13:24

## 2014-08-25 NOTE — H&P (Signed)
PATIENT NAME:  Jeremiah [name], Jeremiah [name] MR#:  [id number] DATE OF BIRTH:  [birth year]  DATE OF ADMISSION:  04/21/2012  PRIMARY CARE PHYSICIAN: He has no local doctor.  REFERRING PHYSICIAN: Dr. Ulice Brilliant.   CHIEF COMPLAINT: The patient has multiple complaints of bilateral leg swelling, cellulitis of the left foot and chronic progressive worsening of shortness of breath.   HISTORY OF PRESENT ILLNESS: Jeremiah [name] is a 64 year old Caucasian male, chronic smoker of more than 2 packs a day. He came to the Emergency Department for evaluation of bilateral leg swelling. They are edematous and oozing serous fluid. There is also development of cellulitis in the left foot extending just above the ankle associated with crusty skin lesions apparently induced also by wearing boots and standing for 10 hours a day working for the [employer]. Additionally, the patient reports that he has shortness of breath. He notes that about more than 6 months ago it progressively got worse and that lately he is short of breath even for walking 30 feet. He reports no chest pain. No palpitations. No syncope. The patient is a chronic smoker and he tells me that he thinks that he has COPD. He is asking for help to quit smoking.   REVIEW OF SYSTEMS:   CONSTITUTIONAL: Denies any fever. No chills. No fatigue only with exertion.  EYES: No blurring of vision. No double vision.  ENT: No hearing impairment. No sore throat. No dysphagia.  CARDIOVASCULAR: No chest pain, but reports shortness of breath chronically for more than 23month, got worse lately. He has bilateral leg edema. No syncope.  RESPIRATORY: Apart from the shortness of breath. He has no chest pain. No cough. No sputum production. No hemoptysis.  GASTROINTESTINAL: No abdominal pain. No nausea, no vomiting, and no diarrhea.  GENITOURINARY: No dysuria. No frequency of urination.  MUSCULOSKELETAL: No joint pain or swelling. No muscular pain or swelling.  INTEGUMENTARY: He has a skin rash over  the left foot and ankle area. Also, serous oozing of fluid from both legs.  NEUROLOGY: No focal weakness. No seizure activity. No headache.  PSYCHIATRY: No anxiety or depression.  ENDOCRINE: No polyuria or polydipsia. No heat or cold intolerance.   PAST MEDICAL HISTORY: Chronic smoking. Negative history of coronary artery disease. Negative for congestive heart failure.   PAST SURGICAL HISTORY: Ventral hernia repair in 1992 and then in 2003.   FAMILY HISTORY: His father died years ago from a house fire and then his mother died recently a few months ago with a similar event that resulted in smoke inhalation. He has a sister who has hypertension.   SOCIAL HABITS: Chronic heavy smoker 2 packs a day since for the last 25 years. He drinks beer. A 6-pack lasts him about 3 weeks. No other drug abuse.   SOCIAL HISTORY: He is unemployed, working currently with the [employer] He is divorced and living with his young sister.   PHYSICAL EXAMINATION:  VITAL SIGNS: Blood pressure 156/75, respiratory rate is 30, but this slows down once he is resting, pulse 108, temperature 97.8, oxygen saturation is 90% to 92% while at rest, it drops down to 86% after walking.  GENERAL APPEARANCE: Elderly male laying in bed in no acute distress.  HEAD AND NECK: No pallor. No icterus. No cyanosis. Ear examination revealed normal hearing. No ulcers. No discharge. Nasal mucosa normal without ulcers. No discharge. Oropharyngeal area showed no lesions, no oral thrush. He is edentulous. Eye examination revealed normal eyelids and conjunctiva. Pupils about 5 mm, equal,  round, and reactive to light.  Neck is supple. Trachea at midline. No thyromegaly. No cervical lymphadenopathy. No masses.  HEART: Normal S1, S2. No S3, S4. No murmur. No gallop. No carotid bruits.  LUNGS: Normal breathing pattern without use of accessory muscles. This was at time of my examination. No rales. No wheezing.  ABDOMEN: Soft without tenderness. No  hepatosplenomegaly. No masses. No hernias.  SKIN: Both legs are edematous +3, oozing in some of the areas serous fluid. The left foot is erythematous up to just a few inches above the ankle. The left foot also has skin rash with thick, scaly skin lesions that has line of demarcation and coincides with wearing his boots. There is a foul smell coming out of the foot. The patient did not keep himself clean.  MUSCULOSKELETAL: No joint swelling. No clubbing.  NEUROLOGIC: Cranial nerves II through XII are intact. No focal motor deficit.  PSYCHIATRY: The patient is alert and oriented x3. Mood and affect were normal.   LABORATORY FINDINGS: Ultrasound for both lower extremities was negative for deep vein thrombosis. X-ray of the left foot showed no acute bony abnormality. There is soft tissue swelling present. Serum glucose 71. BNP was 21, BUN 11, creatinine 0.7, sodium 141 and potassium 4.2. Calcium 8.4, total protein 7.6, albumin 3.8, total bilirubin 0.4. Normal liver transaminases. CPKs were elevated at _____, but normal troponin less than 0.02. CBC showed white count of 9000, hemoglobin 14, hematocrit 42, platelet count 327. Prothrombin time 13. INR 1. APTT 30.   ASSESSMENT:  1.  Anasarca mostly of lower extremities. Differential diagnosis will include congestive heart failure versus pulmonary hypertension secondary to chronic obstructive pulmonary disease and may be right ventricular failure.  2.  Left leg cellulitis  3.  Chronic shortness of breath getting worse lately, likely secondary to progression of his chronic obstructive pulmonary disease; however, we want to rule out congestive heart failure and also possibility of pulmonary hypertension or cor pulmonale.  4.  Chronic obstructive pulmonary disease. This is chronic and getting worse gradually over months.  5.  Systemic hypertension. The patient is on no treatment.  6.  Tobacco abuse.   PLAN: We will admit the patient to the medical floor. I will  start intravenous Lasix 40 mg twice a day. Repeat basic metabolic profile tomorrow to ensure electrolytes are fine. IV antibiotic using clindamycin 300 mg q. 8 hours since he is allergic to penicillin. I will also add Mycostatin powder to the left foot as there is also evidence of fungal infection as well. Obtain echocardiogram. The patient needs to quit tobacco indefinitely. We will place a nicotine patch. DuoNeb q. 6 hours p.r.n.   TIME SPENT EVALUATING THIS PATIENT: Took more than 55 minutes.   ____________________________ Clovis Pu. Lenore Manner, MD amd:aw D: 04/21/2012 00:23:27 ET T: 04/21/2012 12:28:18 ET JOB#: [id number]  cc: Clovis Pu. Lenore Manner, MD, <Dictator> Ellin Saba MD ELECTRONICALLY SIGNED 04/21/2012 22:40

## 2014-08-28 NOTE — Consult Note (Signed)
Brief Consult Note: Diagnosis: R shoulder impingement with likely rotator cuff tear.   Patient was seen by consultant.   Consult note dictated.   Comments: No need for urgent intervention Would not inject shoulder due to IV steriods and potential progression of tear Would order MRI as outpt and f/u with ortho after MRI complete.  Electronic Signatures: Liviana Mills, [place] Edward (MD)  (Signed 24-May-14 09:48)  Authored: Brief Consult Note   Last Updated: 24-May-14 09:48 by Lourene Hoston, [place] Edward (MD)

## 2014-08-28 NOTE — H&P (Signed)
PATIENT NAME:  Jeremiah [name], Jeremiah [name] MR#:  [id number] DATE OF BIRTH:  03-Oct-1950  DATE OF ADMISSION:  09/24/2012  PRIMARY CARE PHYSICIAN: None.   CHIEF COMPLAINT: Shortness of breath.   HISTORY OF PRESENT ILLNESS: A very pleasant 64 year old male with history of tobacco dependence and COPD, not on oxygen, who presents with shortness of breath and wheezing. Over the past 3 to 4 days, the patient has had increasing shortness of breath, dyspnea on exertion, wheezing, cough with yellowish sputum, so he came to the ER today because of worsening symptoms. In the ER, he received IV steroids and nebulizer treatments; however, on ambulation, his O2 sat dropped to 86%, and he continues to have bilateral wheezing with fair air movement.   REVIEW OF SYSTEMS:  CONSTITUTIONAL: No fever, fatigue or weakness. EYES: No blurred or double vision, glaucoma or cataracts.  ENT: No ear pain, hearing loss, seasonal allergies, epistaxis.  RESPIRATORY: Positive cough, positive wheezing. Positive history of COPD.  CARDIOVASCULAR: No chest pain, palpitations, orthopnea, edema, arrhythmia. Positive dyspnea on exertion. No syncope.  GASTROINTESTINAL: No nausea, vomiting, diarrhea, abdominal pain, melena or ulcers.  GENITOURINARY: No dysuria or hematuria.  ENDOCRINE: No polyuria, polydipsia, thyroid problems, heat or cold intolerance.  SKIN: No rash or lesions.  HEMATOLOGIC AND LYMPHATIC: No anemia or easy bruising.  MUSCULOSKELETAL: He has right shoulder pain which limits activity on the right side.  NEUROLOGIC: No history of CVA, TIA or seizures.  PSYCHIATRIC: No history of anxiety or depression.   PAST MEDICAL HISTORY:  1. COPD. 2. Tobacco dependence.   PAST SURGICAL HISTORY:  1. Neck surgery.  2. Hernia surgery.   MEDICATIONS:  1. Advair 250/50 b.i.d.  2. Combivent 1 puff q.6 hours. 3. Spiriva 18 mcg daily.   ALLERGIES: PENICILLIN CAUSES SWELLING AND RASH.   FAMILY HISTORY: His father had stomach cancer.    SOCIAL HISTORY: The patient used to smoke 2 packs a day. He now smokes 8 cigarettes a day. Very occasional alcohol use. No IV drug use.   PHYSICAL EXAMINATION:  VITAL SIGNS: Temperature 97.7, pulse is 89, respirations 20, blood pressure 135/77. The patient is 93% on room air; however, 86% on ambulation.  GENERAL: The patient is alert, oriented, not in acute distress. No tripoding.  HEENT: Head is atraumatic. Pupils are round and reactive. Sclerae are anicteric. Mucous membranes are moist. Oropharynx is clear.  NECK: Supple without JVD, carotid bruit or enlarged thyroid.  CARDIOVASCULAR: Regular rate and rhythm. No murmurs, gallops or rubs. PMI is not displaced. No JVD.  LUNGS: The patient has decreased air flow throughout his lung fields. Sounds very tight, with minimal wheezing bilaterally.  BACK: No CVA or vertebral tenderness.  ABDOMEN: Obese. Bowel sounds are positive. Nontender. Hard to appreciate organomegaly due to body habitus.   EXTREMITIES: There is no clubbing, cyanosis or edema.  NEUROLOGIC: Cranial nerves II through XII are grossly intact. The patient has decreased strength in the right side arm due to shoulder pain with decreased range of motion of the right arm.  PSYCHIATRIC: Judgment and insight are adequate.   LABORATORIES: White blood cells 11, hemoglobin 14.8, hematocrit 43.7, platelets 387. Troponin less than 0.02. Sodium 135, potassium is 4.1, chloride 103, bicarbonate is 26, BUN is 13, creatinine 0.54, glucose is 87. Chest x-ray shows no acute cardiopulmonary disease. EKG normal sinus rhythm without ST elevations or depressions.   ASSESSMENT AND PLAN: This is a 64 year old male with history of chronic obstructive pulmonary disease and tobacco dependence, who presents with acute  respiratory failure secondary to chronic obstructive pulmonary disease exacerbation.   1. Acute respiratory failure secondary to acute chronic obstructive pulmonary disease exacerbation, as  outlined below.  2. Acute chronic obstructive pulmonary disease exacerbation. The patient will be treated with IV steroids, oxygen as needed, nebulizers and continued on his inhalers. Right now, he is very tight, that is why I am not hearing a lot of wheezing. Will need to monitor him closely.  3. Tobacco dependence. The patient was counseled for 3 minutes to stop smoking. He actually went from 2 packs to 8 cigarettes a day and does want to quit smoking. We placed a nicotine patch.  4. Hyponatremia, very mild, likely secondary to chronic obstructive pulmonary disease. Will repeat a BMP in a.m.  5. Very mild leukocytosis without any evidence of pneumonia on chest x-ray. Will repeat. Likely secondary to chronic obstructive pulmonary disease.   CODE STATUS: The patient is a full code status.   The patient will need a PCP prior to discharge.  TIME SPENT: Approximately 45 minutes.   ____________________________ Donell Beers. Benjie Karvonen, MD spm:OSi D: 09/24/2012 11:08:26 ET T: 09/24/2012 11:25:19 ET JOB#: [id number]  cc: Angus Amini P. Benjie Karvonen, MD, <Dictator> Donell Beers Brasen Bundren MD ELECTRONICALLY SIGNED 09/24/2012 14:40
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
## 2014-08-28 NOTE — H&P (Signed)
PATIENT NAME:  Jeremiah [name], SHAVERS MR#:  [id number] DATE OF BIRTH:  [birth year]  PRIMARY CARE PROVIDER: None.   EMERGENCY ROOM REFERRING PHYSICIAN: Graciella Freer, MD.  CHIEF COMPLAINT: Shortness of breath.   HISTORY OF PRESENT ILLNESS: The patient is a 64 year old white male with history of COPD who also is dependent on tobacco, who was last hospitalized here in May with shortness of breath and a COPD exacerbation, who reports that he was doing well up until about 2 weeks ago when he ran out of his Combivent, Advair, and Spiriva that he was supposed to take. Since then he has had shortness of breath, but today his shortness of breath got very severe and he started becoming very dyspneic and labored breathing. The patient also has had a nonproductive cough.   The patient came to the ED. He was hypoxic on room air. Received 3 nebulizer treatments and still was short of breath. Therefore, we were asked to admit the patient. He continues to complain of shortness of breath.   The patient also reports that over the past few days he has been having swelling in both of his lower extremities. He usually sleeps on the side to help with his breathing. He also complains of chest pain with coughing. He denies any abdominal pain, nausea, vomiting or diarrhea. Denies any urinary symptoms.   PAST MEDICAL HISTORY: COPD, tobacco dependence.   PAST SURGICAL HISTORY: Status post neck surgery, hernia repair.   MEDICATIONS: Currently none. He was on Advair 250/50 b.i.d., Combivent one puffs q.6,  Spiriva 18 mcg daily.   ALLERGIES: mulitple achromycin,celor, hydrocodeone, keflex, mysteclin, oxycode, pcd FAMILY HISTORY: Father had stomach cancer.   SOCIAL HISTORY: The patient use to smoke 2 packs a day. He states that now he is smoking 6 cigarettes a day. Occasionally uses alcohol. No drug use.   REVIEW OF SYSTEMS: CONSTITUTIONAL: Denies any fevers. Complains of chills. No fatigue. No weakness. No weight gain or weight  loss.  EYES: No blurred or double vision. No glaucoma. No cataracts.  ENT: No ear pain. No hearing loss. No seasonal allergies. No epistaxis.  RESPIRATORY: Complains of a nonproductive cough. Complains of wheezing. COPD. No hemoptysis.  CARDIOVASCULAR: Complains of chest pain with coughing. No palpitations. No orthopnea. Does complain of edema. No arrhythmias. Complains of dyspnea on exertion. No syncope.  GASTROINTESTINAL: No nausea, vomiting, diarrhea. No abdominal pain. No melena. No ulcers.  GENITOURINARY: Denies any dysuria, hematuria.  ENDOCRINE: No polyuria, polydipsia. No thyroid problems. No heat or cold intolerance.  SKIN: Denies any rash or lesions.  HEMOLYMPHATIC: No anemia or easy bruisability.  MUSCULOSKELETAL: Denies any pain. No gout. No swelling.  NEUROLOGICAL: Denies any CVA, TIA, or seizures.  PSYCHIATRIC: No anxiety or depression.   PHYSICAL EXAMINATION:  VITAL SIGNS: Temperature 98.4, pulse 99, respirations 26, blood pressure 179/91, O2 91% on room air.  GENERAL: A thin Caucasian male in mild respiratory distress.  HEENT: Head atraumatic, normocephalic. Pupils equally round, reactive to light and accommodation. There is no conjunctival pallor. No scleral icterus. Nasal exam shows no drainage or ulceration.  OROPHARYNX: Clear without any exudate. External ear exam shows no drainage or erythema.  NECK: Supple without any JVD. No carotid bruits.  CARDIOVASCULAR: Regular rate and rhythm. No murmurs, rubs, clicks or gallops.  LUNGS: Bilateral wheezing throughout both lungs. Some accessory muscle usage. There are no crackles or rhonchi.  ABDOMEN: Soft, nontender, nondistended. Positive bowel sounds x4. No hepatosplenomegaly.  EXTREMITIES: He has got 2+ edema. Good DP,  PT pulses.  PSYCHIATRIC: Not anxious or depressed.  NEUROLOGIC: Cranial nerves II through XII grossly intact. No focal deficits noted.  LYMPHATICS: No lymph nodes palpable.  SKIN: No rash.   EVALUATION IN THE  EMERGENCY DEPARTMENT: Glucose 87. BNP was 100. BUN 8, sodium 132, potassium 4.2, chloride 100. CO2 is 26. Calcium 9.4. WBC 10.1, hemoglobin 15.1, hematocrit 43.1. Troponin less than 0.02.   CHEST X-RAY: Hyperinflation. Official read pending.   ASSESSMENT AND PLAN: The patient is a 64 year old with chronic obstructive pulmonary disease, nicotine addiction, who presents for shortness of breath, wheezing, hypoxia and lower extremity swelling.  1.  Acute respiratory failure, likely due to acute-on-chronic obstructive pulmonary disease flare.  2.  Acute-on-chronic obstructive pulmonary disease exacerbation. Will treat him with IV Solu-Medrol q.6. Place him on nebulizers q.6 hours. We will also place him on IV Avelox.  3.  Lower extremity swelling. We will get an echocardiogram to rule out right-sided heart failure. Also check liver function tests to look at albumin to make sure he is not third spacing.  4.  Nicotine addiction. Nicotine patch will be provided. The patient also interested in nicotine patch which we will provide counseling regarding smoking cessation done for 4 minutes.  5.  Accelerated hypertension, likely related to his respiratory distress. I will place him on hydralazine p.r.n.   NOTE: 45 minutes spent on this H and P.     ____________________________ Lafonda Mosses. Posey Pronto, MD shp:np D: 02/06/2013 21:02:02 ET T: 02/06/2013 21:40:21 ET JOB#: [id number]  cc: Ashleynicole Mcclees H. Posey Pronto, MD, <Dictator> Alric Seton MD ELECTRONICALLY SIGNED 02/21/2013 18:25

## 2014-08-28 NOTE — Discharge Summary (Signed)
PATIENT NAME:  Jeremiah [name], Jeremiah [name] MR#:  [id number] DATE OF BIRTH:  04/12/1951  DATE OF ADMISSION:  02/06/2013 DATE OF DISCHARGE:  02/10/2013  The patient's primary care physician will be at the [facility].   FINAL DIAGNOSES: 1.  Acute respiratory failure.  2.  Chronic obstructive pulmonary disease exacerbation.  3.  Tobacco abuse.  4.  Hypertension.  5.  Hypernatremia.  6.  Constipation.   MEDICATIONS ON DISCHARGE: Include prednisone 10 mg 4 tablets day 1, 3 tablets day 2, 2 tablets days 3 and 4, 1 tablet days 5 and 6, half tablet day 7 and 8; Combivent CFC 1 puff 6 times a day, Advair Diskus 250/50, 1 inhalation twice a day, Spiriva 1 inhalation daily, DuoNeb nebulizer solution 3 mL 4 times a day, Colace 100 mg twice a day for constipation, nicotine inhalation every 2 hours as needed for nicotine craving.   DIET: Low sodium diet, regular consistency.   ACTIVITY: As tolerated.   Follow up in 2 to 4 weeks at the [facility].   HOSPITAL COURSE:  The patient was admitted 02/06/2013, discharged 02/10/2013. The patient came in with shortness of breath, was admitted with acute respiratory failure, COPD exacerbation,  was started on high-dose IV Solu-Medrol and nebulizer treatments, and was started on high-dose IV Levaquin. The patient also had some lower extremity swelling and elevated blood pressure, likely secondary to respiratory distress.   LABORATORY AND RADIOLOGICAL DATA:  During the hospital course included an EKG that showed normal sinus rhythm. Liver function tests normal range. Troponin negative. Glucose 87, BUN 8, creatinine 0.59, sodium 132, potassium 4.2, chloride 100, CO2 of 26, calcium 9.4. White blood cell count 10.1, H and H 15.1 and 43.5, platelet count of 298. Chest x-ray showed findings consistent with COPD, superimposed subsegmental atelectasis or early interstitial pneumonia could be present. Blood cultures were negative. Echocardiogram showed an EF of 60% to 65%,  impaired relaxation, severely dilated left atrium. Sodium upon discharge 133, creatinine 0.93.   HOSPITAL COURSE PER PROBLEM LIST:  1.  The patient's acute respiratory failure. The patient remained on oxygen 2 liters during the entire hospitalization except for the day of discharge where he was tapered off his oxygen. He was walked around with physical therapy and held his saturations above 89% in the morning of discharge. In the afternoon of discharge, I actually walked the patient around personally.  Pulse ox remained above 89% with ambulation. The patient does not need oxygen upon discharge.  2.  Chronic obstructive pulmonary disease exacerbation. The patient was started on IV Solu-Medrol, IV Levaquin, Advair and Spiriva and nebulizer treatments. The patient's respiratory status had improved. Breath sounds improved. The patient did have slight expiratory wheeze upon discharge. The patient finished a 5 day course of IV Levaquin here in the hospital. Prednisone taper prescribed on discharge. He needs refills on all of his meds. They were refilled prescription-wise by me.  3.  Tobacco abuse. Smoking cessation counseling done during the hospital course, 3 minutes.  Nicotrol inhaler prescribed.  4.  Hypertension, likely secondary to respiratory distress. No medications for this. Blood pressure upon discharge improved to 140/80.  5.  Hyponatremia. I did fluid restrict the patient while here. Sodium came up to almost the normal range.  6.  Constipation. I did give medications during the hospital course. He did not have a bowel movement while here. I will prescribe Colace upon going home.   TIME SPENT ON DISCHARGE: 35 minutes.   ____________________________  Manaia Samad J. Leslye Peer, MD rjw:dmm D: 02/10/2013 15:50:50 ET T: 02/10/2013 20:18:06 ET JOB#: [id number]  cc: Tana Conch. Leslye Peer, MD, <Dictator> [facility] MD ELECTRONICALLY SIGNED 02/12/2013 14:08
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
## 2014-08-28 NOTE — Consult Note (Signed)
PATIENT NAME:  Jeremiah [name], LAUR MR#:  [id number] DATE OF BIRTH:  [birth year]  DATE OF CONSULTATION:  09/28/2012  CONSULTING PHYSICIAN:  Mali E. Conita Amenta, MD  REASON FOR CONSULTATION:  Right shoulder pain.   HISTORY OF PRESENT ILLNESS: This is a 64 year old male who was admitted to the Medicine service for a COPD exacerbation, who complains of right shoulder pain. Pain has been present for about a month. It is severe. He rates it as 8 out of 10, sharp in nature, exacerbated by attempted movement of the arm, and relieved by rest. He denies any injury. It does wake him from sleep. The pain is based laterally. No significant anterior or posterior pain. He has not had any interventions, such as injections or physical therapy.   PAST MEDICAL HISTORY:  COPD.   PAST SURGICAL HISTORY:  Lumbar spine fusion, hernia repair.   MEDICATIONS:  Combivent, aspirin.   ALLERGIES:  PENICILLIN.   SOCIAL HISTORY:  The patient is a smoker.   FAMILY HISTORY:  Noncontributory.   REVIEW OF SYSTEMS:  No chest pain. Mild shortness of breath. No fevers or chills.   PHYSICAL EXAMINATION: GENERAL:  The patient is well-appearing, well-nourished, no acute distress.  EXTREMITIES:  Examination of the right shoulder reveals overlying skin to be intact, without erythema. He has no significant tenderness to palpation. He has forward flexion of only about 45 degrees actively, passively. His range of motion is 120 degrees of forward flexion. External rotation is 30 degrees with arm at the side. He has 3 out of 5 strength in his supraspinatus, 4 out of 5 in his infraspinatus, 5 out of 5 in the subscap. He has intact sensation distally, good distal pulses. No instability. Examination of the left shoulder was performed in a similar manner, and was free of any abnormalities.  IMAGES:  X-rays of the right shoulder were obtained and reviewed, which reveals evidence of some AC joint arthrosis and some minimal glenohumeral arthritis, with a  high-riding humeral head, which may be positional versus a rotator cuff deficiency.   IMPRESSION:  A 64 year old male with right shoulder pain and impingement, with likely a rotator cuff tear.   PLAN:  The patient would benefit from an MRI of the right shoulder to further evaluate the rotator cuff, This can be done on an outpatient basis. Once the MRI is completed, he can follow up with an orthopedic surgeon to review the results and treat accordingly.     ____________________________ Mali E. Ahmiya Abee, MD ces:mr D: 09/28/2012 17:46:03 ET T: 09/28/2012 21:38:45 ET JOB#: [id number]  cc: Mali E. Joakim Huesman, MD, <Dictator> Mali E Rene Gonsoulin MD ELECTRONICALLY SIGNED 09/30/2012 9:38

## 2014-08-28 NOTE — Discharge Summary (Signed)
PATIENT NAME:  Jeremiah [name], Jeremiah [name] MR#:  [id number] DATE OF BIRTH:  [birth year]  DATE OF ADMISSION:  09/24/2012 DATE OF DISCHARGE:  09/28/2012  FINAL DIAGNOSES: 1.  Chronic obstructive pulmonary disease exacerbation.  2.  Hypertension.  3.  Gastroesophageal reflux disease.  4.  Hyperglycemia due to steroids. 5.  Leukocytosis due to steroids.  6.  Nicotine dependency.  7.  Shoulder pain due to possible rotator cuff injury.  8.  Chest pain, pleuritic, with negative work-up.   DISPOSITION:  Homeless shelter.   MEDICATIONS AT DISCHARGE:  Prednisone taper starting at 40 mg, titrate 10 mg every day, Combivent 100 mcg inhaled every 6 hours as needed, Spiriva 18 mcg once a day, acetaminophen with oxycodone 325/5 mg 1 tablet every 4 to 6 hours as needed for pain, aspirin 81 mg once a day, Advair 250/50 1 puff twice daily, levofloxacin 500 mg every day for seven days.   IMPORTANT RESULTS:  Creatinine of 0.54, sodium 135, glucose 165.  Other electrolytes within normal limits.  Troponin negative x 3.  White blood count on admission 11.3, at discharge 24.8 due to steroids.  Hemoglobin 13.3 at discharge.  EKG, normal sinus rhythm.  No ST depression or elevation seen.  Chest x-ray showed hyperinflation consistent with chronic obstructive pulmonary disease.  No evidence of congestive heart failure or pneumonia.  Shoulder shows there are degenerative changes in the right shoulder, AC joint change at acromial supraspinatal spaces narrowing, this may indicate underlying rotator cuff pathology, follow MRI if necessary.    CONSULTANTS:  Dr. Tamala Julian, orthopedic, who recommended the patient to follow up outpatient ortho with outpatient MRI for evaluation of rotator cuff injury.  For now just recommended physical activity as needed as tolerated.   HOSPITAL COURSE:  [name] is a 64 year old gentleman who I met on several occasions here in the hospital, admitted on 09/24/2012 with shortness of breath.  The patient is not  on oxygen at home, presented with shortness of breath and wheezing 3 to 4 days, increased dyspnea, increased yellow sputum.  He was found to be at 86% oxygen levels on room air at the ER for what he was put on oxygen.  Neb treatments were given and started on IV steroids and levofloxacin.  The patient improved quickly, but during this hospitalization have an issue with chest pain that seemed very pleuritic.  The patient was evaluated with cardiac enzymes and it was negative.  He had a previous echo in 2013 that showed an ejection fraction above 55%.  No akinesis or hypokinesis at that moment.  The patient also started complaining of shoulder pain the day before discharge and he was evaluated by ortho, see mentioning above about recommendations of ortho for follow-up outpatient.  The patient is discharged in good condition with steroids, pain medications and antibiotics.   I spent about 40 minutes with this patient today.  Follow up at [facility].    ____________________________ [place] Sink, MD rsg:ea D: 09/28/2012 13:31:35 ET T: 09/28/2012 23:13:54 ET JOB#: [id number]  cc: Rufus Sink, MD, <Dictator> Claudeen Leason America Brown MD ELECTRONICALLY SIGNED 10/03/2012 21:29

## 2014-08-29 NOTE — Discharge Summary (Signed)
PATIENT NAME:  Jeremiah [name], Jeremiah [name] MR#:  [id number] DATE OF BIRTH:  26-Jun-1950  DATE OF ADMISSION:  09/30/2013 DATE OF DISCHARGE:  10/03/2013  PRIMARY CARE PHYSICIAN:  Nonlocal.   DISCHARGE DIAGNOSES: 1.  Acute chronic obstructive pulmonary disease exacerbation.  2.  Acute bronchitis.  3.  Hyponatremia.  4.  Hypertension.  5.  Tobacco abuse.   CONDITION:  Stable.   CODE STATUS:  FULL CODE.   HOME MEDICATIONS:  Please refer the medication reconciliation list.   DIET:  Low sodium diet.   ACTIVITY:  As tolerated.  FOLLOWUP CARE:  Follow up with PCP within 1 to 2 weeks.   REASON FOR ADMISSION:  Shortness of breath.   HOSPITAL COURSE:  The patient is a 64 year old Caucasian male with a history of COPD presented in the ED with shortness of breath and worsening cough.  In the ED, the patient's O2 sat decreased to 91%.  He was treated with nebulizer and oxygen.  For a detailed history and physical examination, please refer to the admission note dictated by Dr. Theodoro Grist.  Laboratory data on admission date was basically normal except showed BMP was in normal range except sodium 133.  The patient's troponin was less than 0.02.  CBC in normal range.  D-dimer is elevated at 786.  1.  COPD exacerbation.  After admission, the patient has been treated with Solu-Medrol IV with Levaquin, nebulizer and cough medication.  The patient's symptoms have much improved, but still has mild cough and shortness of breath.  The patient's O2 saturation is 94% without oxygen and lung sounds is weak, but no obvious wheezing or crackles.  2.  Hyponatremia.  The patient was treated with normal saline.  Sodium increased to normal range.   3.  The patient has chest pain with wheezing.  Troponin level is negative.  4.  Tobacco abuse.  The patient was counseled for smoking cessation.  Nicotine patch was given.  5.  Hypertension, controlled without medication.   The patient is clinically stable and will be discharged home  today.  I discussed the patient's discharge plan with the patient, nurse, case manager.   TIME SPENT:  About 39 minutes.    ____________________________ Demetrios Loll, MD qc:ea D: 10/03/2013 16:22:32 ET T: 10/04/2013 02:53:40 ET JOB#: [id number]  cc: Demetrios Loll, MD, <Dictator> Demetrios Loll MD ELECTRONICALLY SIGNED 10/04/2013 15:07

## 2014-08-29 NOTE — H&P (Signed)
PATIENT NAME:  Jeremiah [name], Jeremiah [name] MR#:  [id number] DATE OF BIRTH:  04/16/1951  DATE OF ADMISSION:  09/12/2013  PRIMARY CARE PHYSICIAN: [facility].  The patient is 64 year old Caucasian male with past medical history significant for history of COPD, who is continuing to smoke, who presents to the hospital with complaints of 3 or 4 weeks of cough, thick whitish-yellowish phlegm, as well as increasing worsening shortness of breath. According to the patient, he was doing well up until approximately 3 or 4 weeks ago, when he started having problems with increasing cough. He has been wheezing and having problems with chills. He was seen by Emergency Room MD on the 5th of May 2015. At that time, he had an x-ray done of his chest, which revealed possible fluid retention as well as possible right lower lobe pneumonia versus atelectasis. He was initiated on steroids as well as Levaquin, however, did not improve significantly and decided to come to Emergency Room for further evaluation. Now he is complaining of significant chills, as well as worsening shortness of breath. He tells me that he is able to walk only 20 or 30 feet before he needs to stop and catch his breath. He also admits of having increased frequency of urination. He admits of chest pains and shortness of breath. On arrival to the Emergency Room, his O2 sats were found to be 83% on room air on exertion, and hospitalist services were contacted for admission.   PAST MEDICAL HISTORY: Significant for history of admission in October 2014 for acute respiratory failure due to COPD exacerbation, history of COPD, history of tobacco abuse (ongoing), hypertension, hyponatremia, constipation, history of echocardiogram done in October 2014 revealing ejection fraction of 60% to 65% and impaired relaxation of left ventricular filling.   PAST SURGICAL HISTORY: Neck surgery as well as hernia repair.   MEDICATIONS: The patient's medication list is as follows: The  patient does have nebulizing machine at home. He uses albuterol inhalation solution every 6 hours, fluticasone/salmeterol 250/50 twice daily, Levaquin 750 mg p.o. daily, prednisone 60 mg p.o. daily (tapering dose), tiotropium 18 mcg daily, Ventolin HFA 2 puffs 4 times daily as needed.   ALLERGIES: MULTIPLE: ACHROMYCIN, CECLOR, HYDROCODONE, KEFLEX, OXYCODONE, PENICILLIN.   FAMILY HISTORY: The patient's father had stomach cancer.   SOCIAL HISTORY: He used to smoke up to 3 packs a day; now he is down to 1/2 pack a day. Occasional alcohol use. No drug abuse.   REVIEW OF SYSTEMS: Positive for fatigue and weakness, pains in his chest, some weight gain due to lower extremity swelling, some blurring of vision as well as dizziness (especially whenever he moves around), sinus congestion (especially at nighttime), cough with wheezes and shortness of breath (worsening over a period of time), edema in lower extremities, some arrhythmias, palpitations, feeling presyncopal. Also chest pains whenever he moves around, walks around or coughs. Also intermittent dysuria, increased frequency of urination,  and intermittent incontinence, however, admits of emptying his bladder pain. CONSTITUTIONAL: Otherwise denies any fevers. Admit to some chills. Denies any weight loss.  EYES: Denies any double vision, glaucoma or cataracts.  EARS, NOSE, THROAT: Denies any tinnitus, allergies, epistaxis, sinus pain, dentures or difficulty swallowing. RESPIRATORY: Denies any hemoptysis. CARDIOVASCULAR: Denies any orthopnea. GASTROINTESTINAL: Denies nausea, vomiting, diarrhea, rectal bleeding, change in bowel habits.  GENITOURINARY: Denies hematuria. ENDOCRINE: Denies any polydipsia, nocturia, thyroid problems, heat or cold intolerance or thirst. HEMATOLOGIC: Denies any anemia, easy bruising, bleeding, swollen glands. SKIN: Denies any acne, rashes, lesions or  change in moles.  MUSCULOSKELETAL: Denies arthritis, cramps, swelling.   NEUROLOGICAL: No numbness, epilepsy or tremors. PSYCHIATRIC: Denies anxiety or insomnia.   PHYSICAL EXAMINATION:  VITAL SIGNS: On arrival to the hospital, the patient's temperature was 97.8. Pulse was 78. Respiratory rate was 42. Blood pressure 147/72. Saturation was 93% on room air, however, on exertion was 83%.  GENERAL: This is a well-developed, well-nourished, mildly obese Caucasian male in mild to moderate distress, sitting on the stretcher.  HEENT: His pupils are equal and reactive to light. Extraocular movements intact. No icterus or conjunctivitis. Has normal hearing. No pharyngeal erythema. Mucosa is moist.  NECK: No masses. Supple, nontender. Thyroid is not enlarged. No adenopathy. No JVD or carotid bruits bilaterally. Full range of motion.  LUNGS: Markedly diminished breath sounds, very tight to auscultation. A few rhonchi were heard, also wheezing bilaterally and labored inspirations as well as increased effort to breathe whenever he speaks for longer period of time. No dullness to percussion. Not in overt respiratory distress.  CARDIOVASCULAR: S1, S2 appreciated. Rhythm was regular, distant. PMI not lateralized. Chest was nontender to palpation.  EXTREMITIES: Pedal pulses 1+. Trace to 1+ lower extremity edema. No calf tenderness or cyanosis was noted.  ABDOMEN: Soft, nontender. Bowel sounds were present. No hepatosplenomegaly or masses were noted.  RECTAL: Deferred.  MUSCULOSKELETAL: Muscle strength: Able to move all extremities. No cyanosis or degenerative joint disease.  NEUROLOGICAL: Cranial nerves grossly intact. Sensory is intact. No dysarthria or aphasia. The patient is alert, oriented to time, person and place, cooperative. Memory is good. PSYCHIATRIC: No significant confusion, agitation or depression noted.   LABORATORY AND RADIOLOGICAL DATA: BMP showed elevation of BUN of 21. Beta-type natriuretic peptide was 71. Glucose 88, sodium 135; otherwise, BMP was unremarkable. The  patient's alkaline phosphatase was 132; otherwise, liver enzymes were normal. Troponin was less than 0.02. White blood cell count was elevated to 15.7; hemoglobin was 15.9, platelet count 303.  Urinalysis: Yellow clear urine, negative for glucose, bilirubin or ketones, specific gravity 1.010; pH was 6.0; negative for blood, protein, nitrites or leukocyte esterase; less than one red blood cell as well as white blood cell; no bacteria or epithelial cells were noted.   Chest x-ray done today on the 8th of May 2015, portable single view, revealed greater inspiration with improved aeration in the right lung base.   ASSESSMENT AND PLAN: 1.  Acute respiratory failure: Admit patient to medical floor. The patient's acute respiratory failure is due to chronic obstructive pulmonary disease exacerbation likely. Will continue oxygen therapy as needed.  2.  Chronic obstructive pulmonary disease exacerbation: We will start the patient on Solu-Medrol, Advair, tiotropium, as well as nebulizers. Follow clinically.  3.  Pneumonia: We will get sputum cultures. Will continue the patient on Levaquin IV.  4.  Hyponatremia: Follow with therapy. The patient will benefit from advancing his oral intake.  5.  Leukocytosis: Will follow. Likely steroid-related.  6.  Tobacco abuse: Discussed with patient for approximately 4 minutes. Nicotine replacement  therapy will be initiated.   TIME SPENT: 50 minutes.    ____________________________ Theodoro Grist, MD rv:jcm D: 09/12/2013 11:45:31 ET T: 09/12/2013 14:11:26 ET JOB#: [id number]  cc: Theodoro Grist, MD, <Dictator> [facility] [place] MD ELECTRONICALLY SIGNED 10/09/2013 19:58

## 2014-08-29 NOTE — H&P (Signed)
PATIENT NAME:  Jeremiah [name], Jeremiah [name] MR#:  [id number] DATE OF BIRTH:  08-Mar-1951  DATE OF ADMISSION:  09/30/2013  PRIMARY CARE PHYSICIAN: [facility].  The patient is 64 year old Caucasian male with past medical history significant for history of COPD, who presents to the hospital with complaints of shortness of breath. According to the patient, he was doing well up until yesterday when he started having worsening shortness of breath as well as worsening cough. He has been expectorating white-greenish-looking phlegm, which is very thick and difficult to get it up. He admits of having some wheezing but denies any worsening of the wheezing itself. Because of shortness of breath as well as feeling cold and being fatigued and weak, he decided to come to Emergency Room for further evaluation. In the Emergency Room, his O2 sats at rest were 91%, and hospitalist services were contacted for admission.   PAST MEDICAL HISTORY: Significant for history of COPD, history of ongoing tobacco abuse. The patient smokes now approximately 4 cigarettes a day. He has smoked in the past much more. History of acute respiratory failure due to COPD exacerbation in October 2014, also another admission on the 8th of May 2015 due to right-sided pneumonia, sepsis, history of hypertension, hyponatremia, constipation, history of echocardiogram revealing ejection fraction of 60% to 65% and impaired relaxation of left ventricular filling.   PAST SURGICAL HISTORY: Neck surgery as well as hernia repair.   MEDICATIONS: According to medical records, he was discharged 2 weeks ago on Advair Diskus 250/50 twice daily, albuterol inhalation solution, aspirin 81 mg p.o. daily, prednisone taper, tiotropium 1 inhalation daily, and Ventolin as needed. It is unclear if he is still taking those medications.   ALLERGIES: AZITHROMYCIN, CECLOR, HYDROCODONE, KEFLEX, OXYCODONE, AND PENICILLIN.   FAMILY HISTORY: The patient's father had stomach cancer.    SOCIAL HISTORY: He used to smoke up to 3 packs a day, now down to probably 4 cigarettes a day. Occasional alcohol use. No drug abuse. According to him, he lives in shelter until the end of this month, but then he is going to get his own apartment.   REVIEW OF SYSTEMS: Positive for feeling cold, fatigue and weak, pains in the chest with deep breathing, poor appetite, some blurring of vision intermittently, cough as well as wheezes as well as hemoptysis. A few days ago, he had a few speckles blood whenever he was coughing. Also, shortness of breath seemed to be worsening over the past 2 days. Intermittent chest pains with coughing as well as with taking deep breaths, intermittent arrhythmias, feeling presyncopal. Also intermittent constipation.  CONSTITUTIONAL: Denies any high fevers, weight loss or gain. EYES: Denies any blurry vision, double vision, glaucoma or cataracts.  EARS, NOSE, THROAT: Denies any tinnitus, allergies, epistaxis, sinus pain, dentures, difficulty swallowing. RESPIRATORY: Denies any asthma. Admits of COPD. CARDIOVASCULAR: Denies any orthopnea, edema or palpitations. GASTROINTESTINAL: Denies any nausea, vomiting, diarrhea, rectal bleeding, change in bowel habits.  GENITOURINARY: Denies dysuria, hematuria, frequency or incontinence. ENDOCRINE: Denies any polydipsia, nocturia, thyroid problems, heat or cold intolerance or thirst. HEMATOLOGIC: Denies any anemia, easy bruising, bleeding, swollen glands. SKIN: Denies any acne, rashes, lesions or change in moles.  MUSCULOSKELETAL: Denies arthritis, cramps, swelling, gout.  NEUROLOGICAL: No numbness, epilepsy or tremors. PSYCHIATRIC: Denies anxiety, insomnia or depression.   PHYSICAL EXAMINATION:  VITAL SIGNS: On arrival to the hospital, the patient's temperature was 98.3. Pulse was 98. Respiratory rate was 20, blood pressure 103/60. Saturation was 91% to 92% on room air at rest.  GENERAL: This is a well-developed, well-nourished,  Caucasian male in no significant distress, somewhat comfortable on the stretcher.  HEENT: His pupils are equal, reactive to light. Extraocular movements intact. No icterus or conjunctivitis. Has normal hearing. No pharyngeal erythema. Mucosa is moist.  NECK: No masses. Supple, nontender. Thyroid is not enlarged. No adenopathy. No JVD or carotid bruits bilaterally. Full range of motion.  LUNGS: Basically clear to auscultation. No rales, rhonchi or wheezing. No labored inspirations, increased effort,  dullness to percussion, overt respiratory distress. However, the patient does have significantly diminished breath sounds and I barely can hear any kind of lung sounds whatsoever. CARDIOVASCULAR: S1, S2 appreciated, distant. PMI not lateralized. Chest is nontender to palpation. Rhythm is regular.  EXTREMITIES: Normal pedal pulses. No lower extremity edema, calf tenderness or cyanosis was noted.  ABDOMEN: Soft, nontender. Bowel sounds were present. No hepatosplenomegaly or masses were noted.  RECTAL: Deferred.  MUSCULOSKELETAL: Muscle strength: Able to move all extremities. No cyanosis, degenerative joint disease or kyphosis. Gait was not tested.  SKIN: Did not reveal any rashes, lesions, erythema, nodularity or induration. It was warm and dry to palpation.  LYMPH: No adenopathy in the cervical region.  NEUROLOGICAL: Cranial nerves grossly intact. Sensory is intact. No dysarthria or aphasia. The patient is alert, oriented to time, person and place, cooperative. Memory is good. PSYCHIATRIC: No significant confusion, agitation or depression.   LABORATORY, DIAGNOSTIC, AND RADIOLOGICAL DATA: The patient's EKG done in the Emergency Room showed sinus rhythm with short PR interval at 90 beats per minute, normal axis, no acute ST-T changes.   The patient's chest x-ray PA and lateral, 26th of May 2015, revealed chronic bronchitic and diffuse interstitial changes. Left lower lobe nodular density versus developing  lung nodule but no definite focal pneumonia, collapse or consolidation. No effusion. No pneumothorax. Hyperinflated lungs.   The patient's lab studies showed BMP with sodium 133. Troponin less than 0.02. The patient's white blood cell count is normal at 8.3, hemoglobin 14.1, platelet count 308. D-dimer is elevated at 786.   ASSESSMENT AND PLAN: 1.  Chronic obstructive pulmonary disease exacerbation: Admit patient to medical floor. Start the patient on steroids again, antibiotics, inhalation therapy, nebulizers. Follow clinically.  2.  Acute bronchitis: Continue the patient on Levaquin, as HE IS ALLERGIC TO PENICILLIN AS WELL AS ZITHROMAX.  3.  Hyponatremia: Continue IV fluids. Reassess sodium level in the morning.  4.  Has chest pain with breathing, very likely cough related or work of breathing related. However, the patient's D-dimer is up, and chest x-ray was concerning for possible lung nodule so will get CT of chest with contrast.  5.  Tobacco abuse: Nicotine replacement therapy will be initiated. Discussed cessation for approximately 4 to 5 minutes.   TIME SPENT: 55 minutes.    ____________________________ Theodoro Grist, MD rv:jcm D: 09/30/2013 18:41:06 ET T: 09/30/2013 19:03:56 ET JOB#: [id number]  cc: Theodoro Grist, MD, <Dictator> [facility] [place] MD ELECTRONICALLY SIGNED 11/01/2013 17:04

## 2014-08-29 NOTE — Discharge Summary (Signed)
PATIENT NAME:  Jeremiah [name], Jeremiah [name] MR#:  [id number] DATE OF BIRTH:  1950-11-14  DATE OF ADMISSION:  09/12/2013 DATE OF DISCHARGE:  09/18/2013  PRIMARY CARE PHYSICIAN:  At [facility].   CHIEF COMPLAINT:  Shortness of breath.   DISCHARGE DIAGNOSES: 1.  Suspected severe sepsis with pneumonia and acute respiratory failure, now resolved.  2.  Acute chronic obstructive pulmonary disease exacerbation.  3.  Ongoing tobacco abuse.  4.  Constipation.  5.  History of chronic obstructive pulmonary disease.  6.  History of hypertension.  7.  History of hyponatremia.   DISCHARGE MEDICATIONS:  1.  Prednisone 50 mg for a day, then taper by 10 mg until done in 5 days.  2.  Aspirin 81 mg daily.  3.  Albuterol nebs every 6 hours, 3 mL as needed for shortness of breath or wheezing.  4.  Ventolin 90 mcg per inhaled aerosol, 2 puffs 4 times a day as needed.  5.  Spiriva 18 mcg inhaled 1 cap daily.  6.  Advair 250/50 mcg, 1 puff inhaled 2 times a day.   DIET:  Low sodium.   ACTIVITY:  As tolerated.   Please follow with PCP within 1 to 2 weeks.   DISPOSITION:  Home.  SIGNIFICANT LABORATORIES AND IMAGING:  Initial BUN 21, creatinine 0.92, sodium 135. Initial white count of 15.7, last white count of 16.9. Troponin negative x 2. Blood cultures from May 8th, no growth to date. X-ray of the chest, 1 view, on May 8th,  showing greater inspiration with improved aeration of the right lung base.   HISTORY OF PRESENT ILLNESS AND HOSPITAL COURSE: For full details of H and P, please see the dictation on day of admission by Dr. Ether Griffins, but briefly, this is a pleasant 64 year old with history of COPD, who came in for thick whitish-yellowish phlegm, increased shortness of breath and some dyspnea on exertion.  He, of note, was seen in the ER on the 5th of May.  X-ray done which showed possible pneumonia and was given steroids and Levaquin. He came in after he did not feel significantly better and was noted to have sats  of 83% on room air with exertion and therefore was admitted to the hospitalist service.   He did appear to have criteria for severe sepsis on admission with tachypnea, leukocytosis, pneumonia, although improving per x-rays, and acute respiratory failure with significant hypoxemia requiring oxygenation. Blood cultures were sent and he was continued on Levaquin  nebulizers and inhalers and the Robitussin for the cough. The patient was very slow to improve  and required oxygenation for multiple days. At this point, he has finished Levaquin and has been off of it. He does continue the Advair, Spiriva nebs, and at this point, we have managed to get him off of the oxygen. At this point, the respiratory failure has resolved and he was counseled on multiple occasions about not smoking. He will be discharged with outpatient followup with [facility].   DISCHARGE PHYSICAL EXAMINATION: VITAL SIGNS:  On day of discharge, temperature is 98.4, last pulse 98, respiratory rate 20, blood pressure 123/77, O2 sat 92% with exertion on room air.  GENERAL:  The patient is a well-developed, obese male.  LUNGS:  No significant wheezing, improved air entry, but there is just diminished breath sounds.  CARDIOVASCULAR:  Normal S1, S2.  ABDOMEN:   Benign. EXTREMITIES:  No significant lower extremity edema.    TOTAL TIME SPENT:  32 minutes on this discharge.  ____________________________ Vivien Presto, MD sa:dmm D: 09/18/2013 12:13:36 ET T: 09/18/2013 12:30:30 ET JOB#: [id number]  cc: Vivien Presto, MD, <Dictator> [facility] MD ELECTRONICALLY SIGNED 10/16/2013 10:50

## 2014-10-27 ENCOUNTER — Other Ambulatory Visit: Payer: Self-pay

## 2014-10-29 ENCOUNTER — Other Ambulatory Visit: Payer: Self-pay

## 2014-11-03 ENCOUNTER — Other Ambulatory Visit: Payer: Self-pay

## 2014-11-04 ENCOUNTER — Ambulatory Visit: Payer: Self-pay | Admitting: Internal Medicine

## 2014-11-10 ENCOUNTER — Other Ambulatory Visit: Payer: Self-pay

## 2014-11-11 ENCOUNTER — Ambulatory Visit: Payer: Self-pay | Admitting: Internal Medicine

## 2014-11-19 ENCOUNTER — Other Ambulatory Visit: Payer: Self-pay

## 2014-11-23 ENCOUNTER — Encounter (INDEPENDENT_AMBULATORY_CARE_PROVIDER_SITE_OTHER): Payer: Self-pay

## 2014-11-25 ENCOUNTER — Ambulatory Visit: Payer: Self-pay | Admitting: Internal Medicine

## 2014-12-02 ENCOUNTER — Ambulatory Visit: Payer: Self-pay | Admitting: Internal Medicine

## 2014-12-09 ENCOUNTER — Ambulatory Visit: Payer: Self-pay | Admitting: Internal Medicine

## 2015-03-17 ENCOUNTER — Other Ambulatory Visit: Payer: Self-pay

## 2015-03-24 ENCOUNTER — Ambulatory Visit: Payer: Self-pay | Admitting: Internal Medicine

## 2015-03-31 ENCOUNTER — Other Ambulatory Visit: Payer: Self-pay

## 2015-04-07 ENCOUNTER — Ambulatory Visit: Payer: Self-pay | Admitting: Internal Medicine

## 2015-04-07 ENCOUNTER — Ambulatory Visit: Payer: Self-pay

## 2015-04-14 ENCOUNTER — Ambulatory Visit: Payer: Self-pay | Admitting: Internal Medicine

## 2015-04-28 ENCOUNTER — Ambulatory Visit: Payer: Self-pay | Admitting: Internal Medicine

## 2015-07-01 ENCOUNTER — Encounter: Payer: Self-pay | Admitting: Pharmacist

## 2015-07-01 ENCOUNTER — Ambulatory Visit: Payer: Self-pay

## 2015-07-12 ENCOUNTER — Encounter: Payer: Self-pay | Admitting: Pharmacist

## 2015-07-28 ENCOUNTER — Encounter: Payer: Self-pay | Admitting: Emergency Medicine

## 2015-07-28 ENCOUNTER — Inpatient Hospital Stay: Payer: Medicaid Other

## 2015-07-28 ENCOUNTER — Emergency Department: Payer: Medicaid Other

## 2015-07-28 ENCOUNTER — Inpatient Hospital Stay
Admission: EM | Admit: 2015-07-28 | Discharge: 2015-08-06 | DRG: 190 | Disposition: A | Discharge status: Skilled Nursing Facility | Payer: Medicaid Other | Attending: Internal Medicine | Admitting: Internal Medicine

## 2015-07-28 DIAGNOSIS — J439 Emphysema, unspecified: Secondary | ICD-10-CM | POA: Diagnosis not present

## 2015-07-28 DIAGNOSIS — R000 Tachycardia, unspecified: Secondary | ICD-10-CM | POA: Diagnosis present

## 2015-07-28 DIAGNOSIS — E860 Dehydration: Secondary | ICD-10-CM | POA: Diagnosis present

## 2015-07-28 DIAGNOSIS — J209 Acute bronchitis, unspecified: Secondary | ICD-10-CM | POA: Diagnosis present

## 2015-07-28 DIAGNOSIS — Z23 Encounter for immunization: Secondary | ICD-10-CM | POA: Diagnosis not present

## 2015-07-28 DIAGNOSIS — F17210 Nicotine dependence, cigarettes, uncomplicated: Secondary | ICD-10-CM | POA: Diagnosis present

## 2015-07-28 DIAGNOSIS — J441 Chronic obstructive pulmonary disease with (acute) exacerbation: Secondary | ICD-10-CM | POA: Diagnosis present

## 2015-07-28 DIAGNOSIS — Z880 Allergy status to penicillin: Secondary | ICD-10-CM

## 2015-07-28 DIAGNOSIS — J9601 Acute respiratory failure with hypoxia: Secondary | ICD-10-CM | POA: Diagnosis present

## 2015-07-28 DIAGNOSIS — N179 Acute kidney failure, unspecified: Secondary | ICD-10-CM

## 2015-07-28 DIAGNOSIS — J8410 Pulmonary fibrosis, unspecified: Secondary | ICD-10-CM | POA: Diagnosis present

## 2015-07-28 DIAGNOSIS — E871 Hypo-osmolality and hyponatremia: Secondary | ICD-10-CM | POA: Diagnosis present

## 2015-07-28 DIAGNOSIS — J440 Chronic obstructive pulmonary disease with acute lower respiratory infection: Secondary | ICD-10-CM | POA: Diagnosis present

## 2015-07-28 DIAGNOSIS — K219 Gastro-esophageal reflux disease without esophagitis: Secondary | ICD-10-CM | POA: Diagnosis present

## 2015-07-28 DIAGNOSIS — R0602 Shortness of breath: Secondary | ICD-10-CM

## 2015-07-28 DIAGNOSIS — J189 Pneumonia, unspecified organism: Secondary | ICD-10-CM | POA: Diagnosis present

## 2015-07-28 DIAGNOSIS — N170 Acute kidney failure with tubular necrosis: Secondary | ICD-10-CM | POA: Diagnosis present

## 2015-07-28 DIAGNOSIS — J9621 Acute and chronic respiratory failure with hypoxia: Secondary | ICD-10-CM | POA: Diagnosis present

## 2015-07-28 DIAGNOSIS — Z720 Tobacco use: Secondary | ICD-10-CM | POA: Diagnosis not present

## 2015-07-28 DIAGNOSIS — I10 Essential (primary) hypertension: Secondary | ICD-10-CM | POA: Diagnosis present

## 2015-07-28 DIAGNOSIS — N19 Unspecified kidney failure: Secondary | ICD-10-CM

## 2015-07-28 HISTORY — DX: Unspecified asthma, uncomplicated: J45.909

## 2015-07-28 HISTORY — DX: Essential (primary) hypertension: I10

## 2015-07-28 HISTORY — DX: Gastro-esophageal reflux disease without esophagitis: K21.9

## 2015-07-28 HISTORY — DX: Chronic obstructive pulmonary disease, unspecified: J44.9

## 2015-07-28 LAB — CBC
HCT: 43 % (ref 40.0–52.0)
HEMOGLOBIN: 14.6 g/dL (ref 13.0–18.0)
MCH: 29.6 pg (ref 26.0–34.0)
MCHC: 34 g/dL (ref 32.0–36.0)
MCV: 86.9 fL (ref 80.0–100.0)
PLATELETS: 485 10*3/uL — AB (ref 150–440)
RBC: 4.94 MIL/uL (ref 4.40–5.90)
RDW: 14.2 % (ref 11.5–14.5)
WBC: 19.6 10*3/uL — AB (ref 3.8–10.6)

## 2015-07-28 LAB — URINALYSIS COMPLETE WITH MICROSCOPIC (ARMC ONLY)
Bilirubin Urine: NEGATIVE
Glucose, UA: 50 mg/dL — AB
LEUKOCYTES UA: NEGATIVE
Nitrite: NEGATIVE
PH: 5 (ref 5.0–8.0)
PROTEIN: NEGATIVE mg/dL
SPECIFIC GRAVITY, URINE: 1.011 (ref 1.005–1.030)
SQUAMOUS EPITHELIAL / LPF: NONE SEEN

## 2015-07-28 LAB — CK: Total CK: 204 U/L (ref 49–397)

## 2015-07-28 LAB — INFLUENZA PANEL BY PCR (TYPE A & B)
H1N1FLUPCR: NOT DETECTED
INFLAPCR: NEGATIVE
INFLBPCR: NEGATIVE

## 2015-07-28 LAB — BASIC METABOLIC PANEL
ANION GAP: 17 — AB (ref 5–15)
BUN: 106 mg/dL — ABNORMAL HIGH (ref 6–20)
CHLORIDE: 93 mmol/L — AB (ref 101–111)
CO2: 21 mmol/L — AB (ref 22–32)
CREATININE: 3.37 mg/dL — AB (ref 0.61–1.24)
Calcium: 7.9 mg/dL — ABNORMAL LOW (ref 8.9–10.3)
GFR calc non Af Amer: 18 mL/min — ABNORMAL LOW (ref >60)
GFR, EST AFRICAN AMERICAN: 21 mL/min — AB (ref >60)
Glucose, Bld: 159 mg/dL — ABNORMAL HIGH (ref 65–99)
Potassium: 4.1 mmol/L (ref 3.5–5.1)
SODIUM: 131 mmol/L — AB (ref 135–145)

## 2015-07-28 LAB — RAPID INFLUENZA A&B ANTIGENS
Influenza A (ARMC): NEGATIVE
Influenza B (ARMC): NEGATIVE

## 2015-07-28 MED ORDER — MOMETASONE FURO-FORMOTEROL FUM 200-5 MCG/ACT IN AERO
2.0000 | INHALATION_SPRAY | Freq: Two times a day (BID) | RESPIRATORY_TRACT | Status: DC
  Administered 2015-07-28 – 2015-08-06 (×19): 2 via RESPIRATORY_TRACT
  Filled 2015-07-28: qty 8.8

## 2015-07-28 MED ORDER — SODIUM CHLORIDE 0.9 % IV BOLUS (SEPSIS)
1000.0000 mL | Freq: Once | INTRAVENOUS | Status: AC
  Administered 2015-07-28: 1000 mL via INTRAVENOUS

## 2015-07-28 MED ORDER — DEXTROSE 5 % IV SOLN: 500.0000 mg | Freq: Once | INTRAVENOUS | Status: DC

## 2015-07-28 MED ORDER — ALBUTEROL SULFATE (2.5 MG/3ML) 0.083% IN NEBU
5.0000 mg | INHALATION_SOLUTION | Freq: Once | RESPIRATORY_TRACT | Status: AC
  Administered 2015-07-28: 5 mg via RESPIRATORY_TRACT
  Filled 2015-07-28: qty 6

## 2015-07-28 MED ORDER — SODIUM CHLORIDE 0.9 % IV SOLN
INTRAVENOUS | Status: DC
  Administered 2015-07-28 – 2015-07-29 (×2): via INTRAVENOUS

## 2015-07-28 MED ORDER — NICOTINE 21 MG/24HR TD PT24
21.0000 mg | MEDICATED_PATCH | Freq: Every day | TRANSDERMAL | Status: DC
  Administered 2015-07-28 – 2015-08-06 (×10): 21 mg via TRANSDERMAL
  Filled 2015-07-28 (×10): qty 1

## 2015-07-28 MED ORDER — IPRATROPIUM-ALBUTEROL 0.5-2.5 (3) MG/3ML IN SOLN
3.0000 mL | Freq: Once | RESPIRATORY_TRACT | Status: AC
  Administered 2015-07-28: 3 mL via RESPIRATORY_TRACT
  Filled 2015-07-28: qty 3

## 2015-07-28 MED ORDER — PNEUMOCOCCAL VAC POLYVALENT 25 MCG/0.5ML IJ INJ
0.5000 mL | INJECTION | INTRAMUSCULAR | Status: AC
  Administered 2015-07-30: 0.5 mL via INTRAMUSCULAR
  Filled 2015-07-28: qty 0.5

## 2015-07-28 MED ORDER — IPRATROPIUM-ALBUTEROL 0.5-2.5 (3) MG/3ML IN SOLN
3.0000 mL | RESPIRATORY_TRACT | Status: DC
  Administered 2015-07-28 – 2015-08-06 (×54): 3 mL via RESPIRATORY_TRACT
  Filled 2015-07-28 (×54): qty 3

## 2015-07-28 MED ORDER — HEPARIN SODIUM (PORCINE) 5000 UNIT/ML IJ SOLN
5000.0000 [IU] | Freq: Three times a day (TID) | INTRAMUSCULAR | Status: DC
Start: 2015-07-28 — End: 2015-07-29
  Administered 2015-07-28 – 2015-07-29 (×3): 5000 [IU] via SUBCUTANEOUS
  Filled 2015-07-28 (×4): qty 1

## 2015-07-28 MED ORDER — ALBUTEROL SULFATE (2.5 MG/3ML) 0.083% IN NEBU
5.0000 mg | INHALATION_SOLUTION | Freq: Once | RESPIRATORY_TRACT | Status: AC
Start: 2015-07-28 — End: 2015-07-28
  Administered 2015-07-28: 5 mg via RESPIRATORY_TRACT
  Filled 2015-07-28: qty 6

## 2015-07-28 MED ORDER — PREDNISONE 20 MG PO TABS
60.0000 mg | ORAL_TABLET | Freq: Once | ORAL | Status: AC
  Administered 2015-07-28: 60 mg via ORAL
  Filled 2015-07-28: qty 3

## 2015-07-28 MED ORDER — SODIUM CHLORIDE 0.9 % IV BOLUS (SEPSIS): 1500.0000 mL | Freq: Once | INTRAVENOUS | Status: DC

## 2015-07-28 MED ORDER — ACETAMINOPHEN 650 MG RE SUPP
650.0000 mg | Freq: Four times a day (QID) | RECTAL | Status: DC | PRN
Start: 2015-07-28 — End: 2015-08-06

## 2015-07-28 MED ORDER — DEXTROSE 5 % IV SOLN: 500.0000 mg | INTRAVENOUS | Status: DC

## 2015-07-28 MED ORDER — LORAZEPAM 2 MG/ML IJ SOLN
0.5000 mg | Freq: Once | INTRAMUSCULAR | Status: AC
  Administered 2015-07-29: 0.5 mg via INTRAVENOUS
  Filled 2015-07-28: qty 1

## 2015-07-28 MED ORDER — ACETAMINOPHEN 325 MG PO TABS
650.0000 mg | ORAL_TABLET | Freq: Four times a day (QID) | ORAL | Status: DC | PRN
  Administered 2015-07-29 – 2015-08-06 (×7): 650 mg via ORAL
  Filled 2015-07-28 (×6): qty 2

## 2015-07-28 MED ORDER — ONDANSETRON HCL 4 MG PO TABS: 4.0000 mg | ORAL_TABLET | Freq: Four times a day (QID) | ORAL | Status: DC | PRN

## 2015-07-28 MED ORDER — LEVOFLOXACIN 750 MG PO TABS
750.0000 mg | ORAL_TABLET | Freq: Once | ORAL | Status: DC
  Filled 2015-07-28: qty 1

## 2015-07-28 MED ORDER — LEVOFLOXACIN IN D5W 750 MG/150ML IV SOLN
750.0000 mg | INTRAVENOUS | Status: DC
  Administered 2015-07-28: 750 mg via INTRAVENOUS
  Filled 2015-07-28: qty 150

## 2015-07-28 MED ORDER — CEFTRIAXONE SODIUM 1 G IJ SOLR: 1.0000 g | Freq: Once | INTRAMUSCULAR | Status: DC

## 2015-07-28 MED ORDER — PANTOPRAZOLE SODIUM 40 MG PO TBEC
40.0000 mg | DELAYED_RELEASE_TABLET | Freq: Every day | ORAL | Status: DC
  Administered 2015-07-28 – 2015-08-06 (×10): 40 mg via ORAL
  Filled 2015-07-28 (×10): qty 1

## 2015-07-28 MED ORDER — METHYLPREDNISOLONE SODIUM SUCC 125 MG IJ SOLR
60.0000 mg | Freq: Three times a day (TID) | INTRAMUSCULAR | Status: DC
  Administered 2015-07-28 – 2015-07-29 (×4): 60 mg via INTRAVENOUS
  Filled 2015-07-28 (×4): qty 2

## 2015-07-28 MED ORDER — ONDANSETRON HCL 4 MG/2ML IJ SOLN: 4.0000 mg | Freq: Four times a day (QID) | INTRAMUSCULAR | Status: DC | PRN

## 2015-07-28 NOTE — ED Notes (Signed)
Patient states he is unable to void at this time. 

## 2015-07-28 NOTE — ED Notes (Signed)
Worsening shob over the past weak, decreased appetite, wheezing noted, sats 85-90% in triage.

## 2015-07-28 NOTE — H&P (Signed)
[facility] NAME: Jeremiah [name]    MR#:  [id number]  DATE OF BIRTH:  February 23, 1951  DATE OF ADMISSION:  07/28/2015  PRIMARY CARE PHYSICIAN: [facility]  REQUESTING/REFERRING PHYSICIAN: Dr. Carrie Mew  CHIEF COMPLAINT:   Chief Complaint  Patient presents with  . Shortness of Breath    HISTORY OF PRESENT ILLNESS:  Jeremiah [name]  is a 65 y.o. male with a known history of COPD not on home oxygen, ongoing smoking, hypertension and GERD who [facility] presents to the hospital secondary to worsening shortness of breath.  Patient's symptoms have been going on for almost 2 weeks now. Worsened over the last 5 days. Started with cold-like symptoms with congestion and nasal discharge and also postnasal drainage. Denies any fevers but had chills at home. He has been becoming very weak over the last 5 days. Decreased oral intake and also decreased fluids intake. Loss of appetite, decreased urination worsening nausea and cough. Also shortness of breath worsened. He ran out of his inhalers and couldn't take it anymore and presented to the emergency room. He was noted to be diffusely wheezing, tachycardic and tachypneic. Elevated white count, borderline low blood pressure. Being admitted for early sepsis secondary to COPD exacerbation and pneumonia. He is hypoxic on room air and is currently requiring 2 L nasal cannula.  PAST MEDICAL HISTORY:   Past Medical History  Diagnosis Date  . COPD (chronic obstructive pulmonary disease) ([place])   . GERD (gastroesophageal reflux disease)   . HTN (hypertension)     PAST SURGICAL HISTORY:   Past Surgical History  Procedure Laterality Date  . Hernia repair    . Neck surgery      SOCIAL HISTORY:   Social History  Substance Use Topics  . Smoking status: Current Every Day Smoker -- 0.50 packs/day    Types: Cigarettes  . Smokeless tobacco: Not on file  . Alcohol Use:  0.0 oz/week    0 Standard drinks or equivalent per week     Comment: beers occasionally    FAMILY HISTORY:   Family History  Problem Relation Age of Onset  . Cancer - Colon Father   . Congestive Heart Failure Mother     DRUG ALLERGIES:   Allergies  Allergen Reactions  . Penicillins Swelling    REVIEW OF SYSTEMS:   Review of Systems  Constitutional: Positive for chills and malaise/fatigue. Negative for fever and weight loss.  HENT: Negative for ear discharge, ear pain, hearing loss, nosebleeds and tinnitus.   Eyes: Positive for photophobia. Negative for blurred vision and double vision.  Respiratory: Positive for cough, sputum production, shortness of breath and wheezing. Negative for hemoptysis.   Cardiovascular: Positive for orthopnea. Negative for chest pain, palpitations and leg swelling.  Gastrointestinal: Positive for nausea. Negative for heartburn, vomiting, abdominal pain, diarrhea, constipation and melena.  Genitourinary: Negative for dysuria, urgency, frequency and hematuria.  Musculoskeletal: Negative for myalgias, back pain and neck pain.  Skin: Negative for rash.  Neurological: Negative for dizziness, tingling, tremors, sensory change, speech change, focal weakness and headaches.  Endo/Heme/Allergies: Does not bruise/bleed easily.  Psychiatric/Behavioral: Negative for depression.    MEDICATIONS AT HOME:   Prior to Admission medications   Not on File      VITAL SIGNS:  Blood pressure 97/60, pulse 101, temperature 97.6 F (36.4 C), temperature source Oral, resp. rate 18, height '6\' 1"'$  (1.854 m), weight 107.956 kg (238  lb), SpO2 89 %.  PHYSICAL EXAMINATION:   Physical Exam  GENERAL:  65 y.o.-year-old patient lying in the bed, some tachypnea, coughing while talking.  EYES: Pupils equal, round, reactive to light and accommodation. No scleral icterus. Extraocular muscles intact.  HEENT: Head atraumatic, normocephalic. Oropharynx and nasopharynx clear.   NECK:  Supple, no jugular venous distention. No thyroid enlargement, no tenderness.  LUNGS: Very tight to auscultation on the right side, diffuse expiratory wheezing on the left side, No rales,rhonchi or crepitation. No use of accessory muscles of respiration.  CARDIOVASCULAR: S1, S2 normal. No murmurs, rubs, or gallops.  ABDOMEN: Soft, nontender, nondistended. Bowel sounds present. No organomegaly or mass.  EXTREMITIES: No pedal edema, cyanosis, or clubbing.  NEUROLOGIC: Cranial nerves II through XII are intact. Muscle strength 5/5 in all extremities. Sensation intact. Gait not checked.  PSYCHIATRIC: The patient is alert and oriented x 3.  SKIN: No obvious rash, lesion, or ulcer.   LABORATORY PANEL:   CBC  Recent Labs Lab 07/28/15 1046  WBC 19.6*  HGB 14.6  HCT 43.0  PLT 485*   ------------------------------------------------------------------------------------------------------------------  Chemistries   Recent Labs Lab 07/28/15 1046  NA 131*  K 4.1  CL 93*  CO2 21*  GLUCOSE 159*  BUN 106*  CREATININE 3.37*  CALCIUM 7.9*   ------------------------------------------------------------------------------------------------------------------  Cardiac Enzymes No results for input(s): TROPONINI in the last 168 hours. ------------------------------------------------------------------------------------------------------------------  RADIOLOGY:  Dg Chest 2 View  07/28/2015  CLINICAL DATA:  Productive cough for 2 weeks. Shortness of breath and chest pain for 1 week. COPD. EXAM: CHEST  2 VIEW COMPARISON:  09/30/2013 FINDINGS: The heart size and mediastinal contours are within normal limits. Pulmonary hyperinflation and diffuse interstitial prominence show no significant change, consistent with COPD. No evidence acute infiltrate or pleural effusion. No evidence of pneumothorax. Mild thoracic spine degenerative changes again noted. IMPRESSION: COPD.  No active disease.  Electronically Signed   By: Earle Gell M.D.   On: 07/28/2015 12:38    EKG:   Orders placed or performed during the hospital encounter of 07/28/15  . ED EKG  . ED EKG    IMPRESSION AND PLAN:   Jeremiah [name]  is a 65 y.o. male with a known history of COPD not on home oxygen, ongoing smoking, hypertension and GERD who [facility] presents to the hospital secondary to worsening shortness of breath.   #1 Acute hypoxic respiratory failure- secondary to COPD exacerbation and possible PNA - admit, IV fluids, blood cultures and started on rocephin and azithromycin - IV steroids, nebs, inahlers - o2 support  #2 HTN- low BP now, hold lisinopril and HCTZ, also from [facility]  #3 GERD-started on protonix  #4 ARF- ATN from sepsis, pre renal  As poor po intake - IV fluids, renal US, avoid nephrotoxins Strict I&O monitoring  #5 Sepsis- blood cultures sent, IV fluids and ABX  #6 Tobacco use disorder- counseled for 15mn against smoking, started on nicotine patch  #7 DVT prophylaxis- on heparin SQ   All the records are reviewed and case discussed with ED provider. Management plans discussed with the patient, family and they are in agreement.  CODE STATUS: Full Code  TOTAL TIME TAKING CARE OF THIS PATIENT: 60 minutes.    KGladstone LighterM.D on 07/28/2015 at 1:17 PM  Between 7am to 6pm - Pager - [phone]  After 6pm go to [email or web address] - password [email or web address]  Office  [phone] CC: Primary care physician; No  primary care provider on file.

## 2015-07-28 NOTE — Progress Notes (Signed)
Pharmacy Antibiotic Note  Jeremiah [name] is a 65 y.o. male admitted on 07/28/2015 with pneumonia.  Pharmacy has been consulted for levofloxacin dosing. Patient has a CrCl of ~29 mL/min.  Plan: Levofloxacin 750 mg IV q 48 hours based on renal function and indication  Height: '6\' 1"'$  (185.4 cm) Weight: 238 lb (107.956 kg) IBW/kg (Calculated) : 79.9  Temp (24hrs), Avg:97.6 F (36.4 C), Min:97.6 F (36.4 C), Max:97.6 F (36.4 C)   Recent Labs Lab 07/28/15 1046  WBC 19.6*  CREATININE 3.37*    Estimated Creatinine Clearance: 28.5 mL/min (by C-G formula based on Cr of 3.37).    Allergies  Allergen Reactions  . Penicillins Anaphylaxis, Swelling and Other (See Comments)    Has patient had a PCN reaction causing immediate rash, facial/tongue/throat swelling, SOB or lightheadedness with hypotension: Yes Has patient had a PCN reaction causing severe rash involving mucus membranes or skin necrosis: No Has patient had a PCN reaction that required hospitalization No Has patient had a PCN reaction occurring within the last 10 years: No If all of the above answers are "NO", then may proceed with Cephalosporin use.    Antimicrobials this admission: 3/22 levofloxacin >>   Dose adjustments this admission: n/a  Microbiology results: 3/22 BCx: Sent  Thank you for allowing pharmacy to be a part of this patient's care.  Lenis Noon, PharmD Clinical Pharmacist 07/28/2015 2:12 PM

## 2015-07-28 NOTE — ED Notes (Signed)
Patient's roommate states patient smokes 2.5 packs of cigarettes a day and uses his inhalers and nebulizers too often.

## 2015-07-28 NOTE — ED Provider Notes (Signed)
[facility] Emergency Department Provider Note  ____________________________________________  Time seen: 11:20 AM  I have reviewed the triage vital signs and the nursing notes.   HISTORY  Chief Complaint Shortness of Breath    HPI Jeremiah [name] is a 65 y.o. male who complains of shortness of breath over the last week. He is also had decreased appetite and only drinking a small amount of fluids over the last 5 days. He states that he has not had the desire to eat or drink anything. Gets very short of breath whenever he walks around. He is having a cough which is occasionally productive. No fever. Denies chest pain. No syncope. No new orthopnea. No leg swelling or pain.No travel, hospitalization surgery or history of DVT or PE.     Past Medical History  Diagnosis Date  . COPD (chronic obstructive pulmonary disease) ([facility])      There are no active problems to display for this patient.    Past Surgical History  Procedure Laterality Date  . Hernia repair    . Neck surgery       No current outpatient prescriptions on file.   Allergies Penicillins   No family history on file.  Social History Social History  Substance Use Topics  . Smoking status: Current Every Day Smoker -- 0.50 packs/day    Types: Cigarettes  . Smokeless tobacco: None  . Alcohol Use: Yes     Comment: rarely    Review of Systems  Constitutional:   No fever or chills. No weight changes. Loss of appetite Eyes:   No blurry vision or double vision.  ENT:   No sore throat.  Cardiovascular:   No chest pain. Respiratory:   Positive shortness of breath and productive cough. Gastrointestinal:   Negative for abdominal pain, vomiting and diarrhea.  No BRBPR or melena. Genitourinary:   Negative for dysuria or difficulty urinating. Decreased urine output Musculoskeletal:   Negative for back pain. No joint swelling or pain. Skin:   Negative for rash. Neurological:   Negative  for headaches, focal weakness or numbness. Psychiatric:  No anxiety or depression.   Endocrine:  No changes in energy or sleep difficulty.  10-point ROS otherwise negative.  ____________________________________________   PHYSICAL EXAM:  VITAL SIGNS: ED Triage Vitals  Enc Vitals Group     BP 07/28/15 1040 116/48 mmHg     Pulse Rate 07/28/15 1040 106     Resp 07/28/15 1116 18     Temp 07/28/15 1040 97.6 F (36.4 C)     Temp Source 07/28/15 1040 Oral     SpO2 07/28/15 1040 90 %     Weight 07/28/15 1040 238 lb (107.956 kg)     Height 07/28/15 1040 '6\' 1"'$  (1.854 m)     Head Cir --      Peak Flow --      Pain Score 07/28/15 1116 6     Pain Loc --      Pain Edu? --      Excl. in [place]? --     Vital signs reviewed, nursing assessments reviewed. Room air oxygen saturation 82% on my exam. Oxygen saturation 90% on 2 L nasal cannula. Does not use home oxygen.  Constitutional:   Alert and oriented. Well appearing and in no distress. Eyes:   No scleral icterus. No conjunctival pallor. PERRL. EOMI ENT   Head:   Normocephalic and atraumatic.   Nose:   No congestion/rhinnorhea. No septal hematoma   Mouth/Throat:  Dry mucous membranes, no pharyngeal erythema. No peritonsillar mass.    Neck:   No stridor. No SubQ emphysema. No meningismus. Hematological/Lymphatic/Immunilogical:   No cervical lymphadenopathy. Cardiovascular:   Tachycardia heart rate 110. Symmetric bilateral radial and DP pulses.  No murmurs.  Respiratory:   No tachypnea, no retractions. Good air entry in all lung fields, diffuse respiratory wheezing with prolonged expiratory phase..  Gastrointestinal:   Soft and nontender. Non distended. There is no CVA tenderness.  No rebound, rigidity, or guarding. Genitourinary:   deferred Musculoskeletal:   Nontender with normal range of motion in all extremities. No joint effusions.  No lower extremity tenderness.  No edema. Neurologic:   Normal speech and language.  CN  2-10 normal. Motor grossly intact. No gross focal neurologic deficits are appreciated.  Skin:    Skin is warm, dry and intact. No rash noted.  No petechiae, purpura, or bullae. Psychiatric:   Mood and affect are normal. ____________________________________________    LABS (pertinent positives/negatives) (all labs ordered are listed, but only abnormal results are displayed) Labs Reviewed  BASIC METABOLIC PANEL - Abnormal; Notable for the following:    Sodium 131 (*)    Chloride 93 (*)    CO2 21 (*)    Glucose, Bld 159 (*)    BUN 106 (*)    Creatinine, Ser 3.37 (*)    Calcium 7.9 (*)    GFR calc non Af Amer 18 (*)    GFR calc Af Amer 21 (*)    Anion gap 17 (*)    All other components within normal limits  CBC - Abnormal; Notable for the following:    WBC 19.6 (*)    Platelets 485 (*)    All other components within normal limits  RAPID INFLUENZA A&B ANTIGENS ([facility] ONLY)  CK  BLOOD GAS, VENOUS  URINALYSIS COMPLETEWITH MICROSCOPIC ([facility] ONLY)   ____________________________________________   EKG  EKG interpreted by me Sinus tachycardia rate 104, normal axis and intervals, normal QRS ST segments and T waves.  ____________________________________________    RADIOLOGY  Chest x-ray unremarkable, consistent with COPD  ____________________________________________   PROCEDURES CRITICAL CARE Performed by: Joni Fears, Lenton Gendreau   Total critical care time: 35 minutes  Critical care time was exclusive of separately billable procedures and treating other patients.  Critical care was necessary to treat or prevent imminent or life-threatening deterioration.  Critical care was time spent personally by me on the following activities: development of treatment plan with patient and/or surrogate as well as nursing, discussions with consultants, evaluation of patient's response to treatment, examination of patient, obtaining history from patient or surrogate, ordering and performing  treatments and interventions, ordering and review of laboratory studies, ordering and review of radiographic studies, pulse oximetry and re-evaluation of patient's condition.   ____________________________________________   INITIAL IMPRESSION / ASSESSMENT AND PLAN / ED COURSE  Pertinent labs & imaging results that were available during my care of the patient were reviewed by me and considered in my medical decision making (see chart for details).  Patient presents with COPD exacerbation with hypoxia. Diffusely wheezy. We'll give prednisone and nebulizer treatments, check labs and chest x-ray. IV fluids due to dehydration with tachycardia and thirst.  ----------------------------------------- 12:58 PM on 07/28/2015 -----------------------------------------  Patient found to have acute renal failure with creatinine of 3.3 on a baseline of 0.8. Also very uremic with a BUN of 106. We will start aggressive IV fluid rehydration with saline. Also add on a CK level. Check influenza related to COPD  exacerbation along with a VBG. Case discussed with the hospitalist for admission. Ceftriaxone and azithromycin antibiotics due to clinical underlying community acquired pneumonia. With the combination of hypoxia and tachycardia suspected pneumonia and evidence of organ dysfunction this patient actually may be presenting with sepsis we will pursue very aggressive IV fluid hydration for resuscitation and continued following of his kidney function.     ____________________________________________   FINAL CLINICAL IMPRESSION(S) / ED DIAGNOSES  Final diagnoses:  Acute respiratory failure with hypoxia (H[facility])  Acute renal failure, unspecified acute renal failure type ([place])  Uremia   acute respiratory failure with hypoxia Acute COPD exacerbation Acute renal failure with uremia    Carrie Mew, MD 07/28/15 1302

## 2015-07-28 NOTE — Progress Notes (Signed)
Notified Dr. Tressia Miners that patient states that he had fever and chills at home. Order received for influenza pcr

## 2015-07-28 NOTE — ED Notes (Signed)
Patient is unable to void at this time. Patient has left for ultrasound.
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
## 2015-07-28 NOTE — ED Notes (Signed)
Pt has hx of copd.

## 2015-07-28 NOTE — ED Notes (Signed)
2L O2 placed to pulse ox at 88%.

## 2015-07-29 LAB — CBC
HEMATOCRIT: 38.2 % — AB (ref 40.0–52.0)
Hemoglobin: 12.9 g/dL — ABNORMAL LOW (ref 13.0–18.0)
MCH: 29.4 pg (ref 26.0–34.0)
MCHC: 33.9 g/dL (ref 32.0–36.0)
MCV: 86.9 fL (ref 80.0–100.0)
PLATELETS: 402 10*3/uL (ref 150–440)
RBC: 4.39 MIL/uL — AB (ref 4.40–5.90)
RDW: 14.3 % (ref 11.5–14.5)
WBC: 15.2 10*3/uL — AB (ref 3.8–10.6)

## 2015-07-29 LAB — BASIC METABOLIC PANEL
ANION GAP: 10 (ref 5–15)
BUN: 87 mg/dL — ABNORMAL HIGH (ref 6–20)
CO2: 22 mmol/L (ref 22–32)
Calcium: 8.3 mg/dL — ABNORMAL LOW (ref 8.9–10.3)
Chloride: 101 mmol/L (ref 101–111)
Creatinine, Ser: 1.48 mg/dL — ABNORMAL HIGH (ref 0.61–1.24)
GFR calc Af Amer: 56 mL/min — ABNORMAL LOW (ref >60)
GFR, EST NON AFRICAN AMERICAN: 48 mL/min — AB (ref >60)
GLUCOSE: 150 mg/dL — AB (ref 65–99)
POTASSIUM: 4.1 mmol/L (ref 3.5–5.1)
Sodium: 133 mmol/L — ABNORMAL LOW (ref 135–145)

## 2015-07-29 MED ORDER — METHYLPREDNISOLONE SODIUM SUCC 40 MG IJ SOLR
40.0000 mg | Freq: Three times a day (TID) | INTRAMUSCULAR | Status: DC
  Administered 2015-07-29 – 2015-08-01 (×8): 40 mg via INTRAVENOUS
  Filled 2015-07-29 (×8): qty 1

## 2015-07-29 MED ORDER — ENOXAPARIN SODIUM 40 MG/0.4ML ~~LOC~~ SOLN
40.0000 mg | SUBCUTANEOUS | Status: DC
  Administered 2015-07-29 – 2015-08-05 (×8): 40 mg via SUBCUTANEOUS
  Filled 2015-07-29 (×9): qty 0.4

## 2015-07-29 MED ORDER — LEVOFLOXACIN IN D5W 750 MG/150ML IV SOLN
750.0000 mg | INTRAVENOUS | Status: DC
  Administered 2015-07-29: 750 mg via INTRAVENOUS
  Filled 2015-07-29: qty 150

## 2015-07-29 MED ORDER — GUAIFENESIN 100 MG/5ML PO SOLN
5.0000 mL | ORAL | Status: DC | PRN
  Administered 2015-07-29 – 2015-08-02 (×13): 100 mg via ORAL
  Filled 2015-07-29 (×13): qty 10

## 2015-07-29 MED ORDER — LEVOFLOXACIN IN D5W 750 MG/150ML IV SOLN
750.0000 mg | INTRAVENOUS | Status: DC
  Administered 2015-07-30: 750 mg via INTRAVENOUS
  Filled 2015-07-29 (×3): qty 150

## 2015-07-29 MED ORDER — LEVOFLOXACIN IN D5W 750 MG/150ML IV SOLN: 750.0000 mg | INTRAVENOUS | Status: DC

## 2015-07-29 MED ORDER — BENZONATATE 100 MG PO CAPS
200.0000 mg | ORAL_CAPSULE | Freq: Three times a day (TID) | ORAL | Status: DC
  Administered 2015-07-29 – 2015-08-06 (×24): 200 mg via ORAL
  Filled 2015-07-29 (×24): qty 2

## 2015-07-29 NOTE — Care Management (Signed)
Patient showing as Medicare beneficiary however [facility] is following him as self-pay. RNCM will follow for medication assistance.

## 2015-07-29 NOTE — Progress Notes (Signed)
Pharmacy Antibiotic Note  Jeremiah [name] is a 65 y.o. male admitted on 07/28/2015 with pneumonia.  Pharmacy consulted for levofloxacin dosing. Patient's CrCl has now improved to as a CrCl of 65 mL/min.  Plan: Will change to levofloxacin 750 mg IV q24 hours.   Height: '6\' 1"'$  (185.4 cm) Weight: 238 lb (107.956 kg) IBW/kg (Calculated) : 79.9  Temp (24hrs), Avg:97.2 F (36.2 C), Min:95.4 F (35.2 C), Max:98 F (36.7 C)   Recent Labs Lab 07/28/15 1046 07/29/15 0552 07/29/15 0800  WBC 19.6* 15.2*  --   CREATININE 3.37*  --  1.48*    Estimated Creatinine Clearance: 65 mL/min (by C-G formula based on Cr of 1.48).    Allergies  Allergen Reactions  . Penicillins Anaphylaxis, Swelling and Other (See Comments)    Has patient had a PCN reaction causing immediate rash, facial/tongue/throat swelling, SOB or lightheadedness with hypotension: Yes Has patient had a PCN reaction causing severe rash involving mucus membranes or skin necrosis: No Has patient had a PCN reaction that required hospitalization No Has patient had a PCN reaction occurring within the last 10 years: No If all of the above answers are "NO", then may proceed with Cephalosporin use.    Antimicrobials this admission: 3/22 levofloxacin >>   Dose adjustments this admission: Levofloxacin 750 mg IV q24 hours on 3/23.  Microbiology results: 3/22 BCx: Sent  Thank you for allowing pharmacy to be a part of this patient's care.  Larene Beach, PharmD Clinical Pharmacist 07/29/2015 10:20 AM
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
## 2015-07-29 NOTE — Progress Notes (Signed)
Patient ID: Niel Peretti, male   DOB: 1950/09/24, 65 y.o.   MRN: [id number] [facility] PROGRESS NOTE  Fannie Alomar [id number]:[id number] DOB: 1951/03/02 DOA: 07/28/2015 PCP: No primary care provider on file.  HPI/Subjective: Patient thinks he is breathing a little bit better. Still wheezing and coughing and asking for cough medication. He is urinating well.  Objective: Filed Vitals:   07/29/15 1400 07/29/15 1550  BP:  130/75  Pulse: 97 101  Temp:  97.6 F (36.4 C)  Resp:  18    Filed Weights   07/28/15 1040  Weight: 107.956 kg (238 lb)    ROS: Review of Systems  Constitutional: Negative for fever and chills.  Eyes: Negative for blurred vision.  Respiratory: Positive for cough, sputum production, shortness of breath and wheezing.   Cardiovascular: Negative for chest pain.  Gastrointestinal: Negative for nausea, vomiting, abdominal pain, diarrhea and constipation.  Genitourinary: Negative for dysuria.  Musculoskeletal: Negative for joint pain.  Neurological: Negative for dizziness and headaches.   Exam: Physical Exam  Constitutional: He is oriented to person, place, and time.  HENT:  Nose: No mucosal edema.  Mouth/Throat: No oropharyngeal exudate or posterior oropharyngeal edema.  Eyes: Conjunctivae, EOM and lids are normal. Pupils are equal, round, and reactive to light.  Neck: No JVD present. Carotid bruit is not present. No edema present. No thyroid mass and no thyromegaly present.  Cardiovascular: S1 normal and S2 normal.  Exam reveals no gallop.   Murmur heard.  Systolic murmur is present with a grade of 2/6  Pulses:      Dorsalis pedis pulses are 2+ on the right side, and 2+ on the left side.  Respiratory: No respiratory distress. He has decreased breath sounds in the right middle field, the right lower field, the left middle field and the left lower field. He has wheezes in the right middle field, the right lower field, the left middle field and  the left lower field. He has no rhonchi. He has no rales.  GI: Soft. Bowel sounds are normal. There is no tenderness.  Musculoskeletal:       Right ankle: He exhibits swelling.       Left ankle: He exhibits swelling.  Lymphadenopathy:    He has no cervical adenopathy.  Neurological: He is alert and oriented to person, place, and time. No cranial nerve deficit.  Skin: Skin is warm. No rash noted. Nails show no clubbing.  Psychiatric: He has a normal mood and affect.      Data Reviewed: Basic Metabolic Panel:  Recent Labs Lab 07/28/15 1046 07/29/15 0800  NA 131* 133*  K 4.1 4.1  CL 93* 101  CO2 21* 22  GLUCOSE 159* 150*  BUN 106* 87*  CREATININE 3.37* 1.48*  CALCIUM 7.9* 8.3*   LiCBC:  Recent Labs Lab 07/28/15 1046 07/29/15 0552  WBC 19.6* 15.2*  HGB 14.6 12.9*  HCT 43.0 38.2*  MCV 86.9 86.9  PLT 485* 402   Cardiac Enzymes:  Recent Labs Lab 07/28/15 1046  CKTOTAL 204     Recent Results (from the past 240 hour(s))  Rapid Influenza A&B Antigens ([facility] only)     Status: None   Collection Time: 07/28/15  1:00 PM  Result Value Ref Range Status   Influenza A ([facility]) NEGATIVE NEGATIVE Final   Influenza B ([facility]) NEGATIVE NEGATIVE Final  CULTURE, BLOOD (ROUTINE X 2) w Reflex to PCR ID Panel     Status: None (Preliminary result)   Collection Time:  07/28/15  3:06 PM  Result Value Ref Range Status   Specimen Description BLOOD LEFT ASSIST CONTROL  Final   Special Requests   Final    BOTTLES DRAWN AEROBIC AND ANAEROBIC 6CC AERO 10CC ANA   Culture NO GROWTH < 24 HOURS  Final   Report Status PENDING  Incomplete  CULTURE, BLOOD (ROUTINE X 2) w Reflex to PCR ID Panel     Status: None (Preliminary result)   Collection Time: 07/28/15  3:30 PM  Result Value Ref Range Status   Specimen Description BLOOD LEFT WRIST  Final   Special Requests   Final    BOTTLES DRAWN AEROBIC AND ANAEROBIC [place] AERO 5CC ANA   Culture NO GROWTH < 24 HOURS  Final   Report Status PENDING   Incomplete     Studies: Dg Chest 2 View  07/28/2015  CLINICAL DATA:  Productive cough for 2 weeks. Shortness of breath and chest pain for 1 week. COPD. EXAM: CHEST  2 VIEW COMPARISON:  09/30/2013 FINDINGS: The heart size and mediastinal contours are within normal limits. Pulmonary hyperinflation and diffuse interstitial prominence show no significant change, consistent with COPD. No evidence acute infiltrate or pleural effusion. No evidence of pneumothorax. Mild thoracic spine degenerative changes again noted. IMPRESSION: COPD.  No active disease. Electronically Signed   By: Earle Gell M.D.   On: 07/28/2015 12:38   US Renal  07/28/2015  CLINICAL DATA:  Acute renal failure.  Initial encounter. EXAM: RENAL / URINARY TRACT ULTRASOUND COMPLETE COMPARISON:  None. FINDINGS: Right Kidney: Length: 11.9 cm. Echogenicity is somewhat increased. No mass or hydronephrosis visualized. Left Kidney: Length: 11.9 cm. Echogenicity is mildly increased. No mass or hydronephrosis visualized. Bladder: Appears normal for degree of bladder distention. IMPRESSION: Some increase in cortical echogenicity bilaterally compatible with medical renal disease. Negative for hydronephrosis or acute abnormality. Electronically Signed   By: Inge Rise M.D.   On: 07/28/2015 14:23    Scheduled Meds: . benzonatate  200 mg Oral TID  . enoxaparin (LOVENOX) injection  40 mg Subcutaneous Q24H  . ipratropium-albuterol  3 mL Nebulization Q4H  . levofloxacin (LEVAQUIN) IV  750 mg Intravenous Q24H  . methylPREDNISolone (SOLU-MEDROL) injection  60 mg Intravenous 3 times per day  . mometasone-formoterol  2 puff Inhalation BID  . nicotine  21 mg Transdermal Daily  . pantoprazole  40 mg Oral Daily  . pneumococcal 23 valent vaccine  0.5 mL Intramuscular Tomorrow-1000   Continuous Infusions: . sodium chloride 40 mL/hr at 07/29/15 1132    Assessment/Plan:  1. Acute respiratory failure with hypoxia. Check pulse ox on room air in the  a.m. 2. COPD exacerbation. Continue IV steroids and try to decrease dose to 40 mg IV every 8 hours. Continue nebulizer treatments. Levaquin. 3. Acute renal failure. Creatinine improved overnight. BUN still elevated. Continue IV fluids but at lower rate. Continue to hold lisinopril and hydrochlorothiazide. 4.   Essential hypertension. Blood pressure acceptable off medications. Potentially would restart Cardizem if          blood pressure heart rate go elevated.  Code Status:     Code Status Orders        Start     Ordered   07/28/15 1458  Full code   Continuous     07/28/15 1457    Code Status History    Date Active Date Inactive Code Status Order ID Comments User Context   This patient has a current code status but no historical code status.  Disposition Plan: Home once breathing better  Antibiotics:  Levaquin  Time spent: 25 minutes  Loletha Grayer  [facility]

## 2015-07-30 LAB — BLOOD GAS, VENOUS
Acid-base deficit: 5.6 mmol/L — ABNORMAL HIGH (ref 0.0–2.0)
Bicarbonate: 22.9 mEq/L (ref 21.0–28.0)
O2 SAT: 50.3 %
PATIENT TEMPERATURE: 37
PH VEN: 7.22 — AB (ref 7.320–7.430)
pCO2, Ven: 56 mmHg (ref 44.0–60.0)

## 2015-07-30 LAB — BASIC METABOLIC PANEL
ANION GAP: 6 (ref 5–15)
BUN: 64 mg/dL — ABNORMAL HIGH (ref 6–20)
CALCIUM: 8.5 mg/dL — AB (ref 8.9–10.3)
CHLORIDE: 106 mmol/L (ref 101–111)
CO2: 26 mmol/L (ref 22–32)
CREATININE: 0.97 mg/dL (ref 0.61–1.24)
GFR calc non Af Amer: >60 mL/min (ref >60)
Glucose, Bld: 174 mg/dL — ABNORMAL HIGH (ref 65–99)
Potassium: 4.3 mmol/L (ref 3.5–5.1)
SODIUM: 138 mmol/L (ref 135–145)

## 2015-07-30 NOTE — Care Management (Addendum)
Referral to [facility]- HE HAS MEDICARE. Oxygen has been delivered to this room. [facility] (closed on weekend) will no longer be able to assist this patient and he will need a new PCP. RNCM follow for new meds. May need MATCH. Not certain if he has medication coverage. Not ready to discharge today per MD. Patient advised to find new PCP. I gave patient contact information for [facility] Phone: [phone]. They closed at 1PM today (friday).

## 2015-07-30 NOTE — Care Management Important Message (Signed)
Important Message  Patient Details  Name: Jeremiah [name] MRN: [id number] Date of Birth: 1950-11-07   Medicare Important Message Given:  Yes    Juliann Pulse A Deshonda Cryderman 07/30/2015, 10:46 AM

## 2015-07-30 NOTE — Progress Notes (Signed)
Patient ID: Jeremiah [name], male   DOB: 14-Mar-1951, 65 y.o.   MRN: [id number] [facility] PROGRESS NOTE  Farris Geiman [id number]:[id number] DOB: August 01, 1950 DOA: 07/28/2015 PCP: No primary care provider on file.  HPI/Subjective: Patient still feels short of breath and coughing. Does not feel that his breathing is worse right yet. He is very happy that his kidney function has normalized  Objective: Filed Vitals:   07/30/15 0312 07/30/15 1445  BP: 124/70 120/69  Pulse: 93 95  Temp: 97.5 F (36.4 C) 98.2 F (36.8 C)  Resp: 18 18    Filed Weights   07/28/15 1040  Weight: 107.956 kg (238 lb)    ROS: Review of Systems  Constitutional: Negative for fever and chills.  Eyes: Negative for blurred vision.  Respiratory: Positive for cough, sputum production, shortness of breath and wheezing.   Cardiovascular: Negative for chest pain.  Gastrointestinal: Negative for nausea, vomiting, abdominal pain, diarrhea and constipation.  Genitourinary: Negative for dysuria.  Musculoskeletal: Negative for joint pain.  Neurological: Negative for dizziness and headaches.   Exam: Physical Exam  Constitutional: He is oriented to person, place, and time.  HENT:  Nose: No mucosal edema.  Mouth/Throat: No oropharyngeal exudate or posterior oropharyngeal edema.  Eyes: Conjunctivae, EOM and lids are normal. Pupils are equal, round, and reactive to light.  Neck: No JVD present. Carotid bruit is not present. No edema present. No thyroid mass and no thyromegaly present.  Cardiovascular: S1 normal and S2 normal.  Exam reveals no gallop.   Murmur heard.  Systolic murmur is present with a grade of 2/6  Pulses:      Dorsalis pedis pulses are 2+ on the right side, and 2+ on the left side.  Respiratory: No respiratory distress. He has decreased breath sounds in the right middle field, the right lower field, the left middle field and the left lower field. He has wheezes in the right middle field,  the right lower field, the left middle field and the left lower field. He has no rhonchi. He has no rales.  GI: Soft. Bowel sounds are normal. There is no tenderness.  Musculoskeletal:       Right ankle: He exhibits swelling.       Left ankle: He exhibits swelling.  Lymphadenopathy:    He has no cervical adenopathy.  Neurological: He is alert and oriented to person, place, and time. No cranial nerve deficit.  Skin: Skin is warm. No rash noted. Nails show no clubbing.  Psychiatric: He has a normal mood and affect.      Data Reviewed: Basic Metabolic Panel:  Recent Labs Lab 07/28/15 1046 07/29/15 0800 07/30/15 0529  NA 131* 133* 138  K 4.1 4.1 4.3  CL 93* 101 106  CO2 21* 22 26  GLUCOSE 159* 150* 174*  BUN 106* 87* 64*  CREATININE 3.37* 1.48* 0.97  CALCIUM 7.9* 8.3* 8.5*   LiCBC:  Recent Labs Lab 07/28/15 1046 07/29/15 0552  WBC 19.6* 15.2*  HGB 14.6 12.9*  HCT 43.0 38.2*  MCV 86.9 86.9  PLT 485* 402   Cardiac Enzymes:  Recent Labs Lab 07/28/15 1046  CKTOTAL 204     Recent Results (from the past 240 hour(s))  Rapid Influenza A&B Antigens ([facility] only)     Status: None   Collection Time: 07/28/15  1:00 PM  Result Value Ref Range Status   Influenza A ([facility]) NEGATIVE NEGATIVE Final   Influenza B ([facility]) NEGATIVE NEGATIVE Final  CULTURE, BLOOD (ROUTINE X  2) w Reflex to PCR ID Panel     Status: None (Preliminary result)   Collection Time: 07/28/15  3:06 PM  Result Value Ref Range Status   Specimen Description BLOOD LEFT ASSIST CONTROL  Final   Special Requests   Final    BOTTLES DRAWN AEROBIC AND ANAEROBIC 6CC AERO 10CC ANA   Culture NO GROWTH < 24 HOURS  Final   Report Status PENDING  Incomplete  CULTURE, BLOOD (ROUTINE X 2) w Reflex to PCR ID Panel     Status: None (Preliminary result)   Collection Time: 07/28/15  3:30 PM  Result Value Ref Range Status   Specimen Description BLOOD LEFT WRIST  Final   Special Requests   Final    BOTTLES DRAWN AEROBIC AND  ANAEROBIC [place] AERO 5CC ANA   Culture NO GROWTH < 24 HOURS  Final   Report Status PENDING  Incomplete    Scheduled Meds: . benzonatate  200 mg Oral TID  . enoxaparin (LOVENOX) injection  40 mg Subcutaneous Q24H  . ipratropium-albuterol  3 mL Nebulization Q4H  . levofloxacin (LEVAQUIN) IV  750 mg Intravenous Q24H  . methylPREDNISolone (SOLU-MEDROL) injection  40 mg Intravenous 3 times per day  . mometasone-formoterol  2 puff Inhalation BID  . nicotine  21 mg Transdermal Daily  . pantoprazole  40 mg Oral Daily  . pneumococcal 23 valent vaccine  0.5 mL Intramuscular Tomorrow-1000    Assessment/Plan:  1. Acute respiratory failure with hypoxia. Patient currently qualifies for oxygen at home. I spoke with the care manager to try to set this up just in case the patient goes home over the weekend. 2. COPD exacerbation. Continue IV Solu-Medrol at 40 mg IV every 8 hours. Continue nebulizer treatments. Levaquin. 3. Acute renal failure. Creatinine normalized but BUN is still high. I will stop IV fluids. Encourage oral intake. I will guaiac stools and check a hemoglobin tomorrow morning. 4.   Essential hypertension. Blood pressure acceptable off medications.  Code Status:     Code Status Orders        Start     Ordered   07/28/15 1458  Full code   Continuous     07/28/15 1457    Code Status History    Date Active Date Inactive Code Status Order ID Comments User Context   This patient has a current code status but no historical code status.     Disposition Plan: Home once breathing better  Antibiotics:  Levaquin  Time spent: 25 minutes  Loletha Grayer  [facility]

## 2015-07-30 NOTE — Progress Notes (Signed)
SATURATION QUALIFICATIONS: (This note is used to comply with regulatory documentation for home oxygen)  Patient Saturations on Room Air at Rest = 86%  Patient Saturations on Room Air while Ambulating- not attempted  Patient Saturations on 2 Liters of oxygen while Ambulating = 83%  Please briefly explain why patient needs home oxygen: Pt quickly desat's at rest or with activity

## 2015-07-30 NOTE — Progress Notes (Signed)
Pharmacy Antibiotic Note  Jeremiah [name] is a 65 y.o. male admitted on 07/28/2015 with pneumonia.  Pharmacy consulted for levofloxacin dosing. Patient's CrCl has now improved to as a CrCl of 99 mL/min.  Plan: Continue levofloxacin 750 mg IV q24 hours.    Height: '6\' 1"'$  (185.4 cm) Weight: 238 lb (107.956 kg) IBW/kg (Calculated) : 79.9  Temp (24hrs), Avg:97.9 F (36.6 C), Min:97.5 F (36.4 C), Max:98.6 F (37 C)   Recent Labs Lab 07/28/15 1046 07/29/15 0552 07/29/15 0800 07/30/15 0529  WBC 19.6* 15.2*  --   --   CREATININE 3.37*  --  1.48* 0.97    Estimated Creatinine Clearance: 99.1 mL/min (by C-G formula based on Cr of 0.97).    Allergies  Allergen Reactions  . Penicillins Anaphylaxis, Swelling and Other (See Comments)    Has patient had a PCN reaction causing immediate rash, facial/tongue/throat swelling, SOB or lightheadedness with hypotension: Yes Has patient had a PCN reaction causing severe rash involving mucus membranes or skin necrosis: No Has patient had a PCN reaction that required hospitalization No Has patient had a PCN reaction occurring within the last 10 years: No If all of the above answers are "NO", then may proceed with Cephalosporin use.    Antimicrobials this admission: 3/22 levofloxacin >>   Dose adjustments this admission: Levofloxacin 750 mg IV q24 hours on 3/23.  Microbiology results: 3/22 BCx: Sent  Thank you for allowing pharmacy to be a part of this patient's care.  Larene Beach, PharmD Clinical Pharmacist 07/30/2015 10:12 AM

## 2015-07-31 LAB — BASIC METABOLIC PANEL
ANION GAP: 4 — AB (ref 5–15)
BUN: 40 mg/dL — ABNORMAL HIGH (ref 6–20)
CALCIUM: 8.3 mg/dL — AB (ref 8.9–10.3)
CO2: 30 mmol/L (ref 22–32)
CREATININE: 0.84 mg/dL (ref 0.61–1.24)
Chloride: 107 mmol/L (ref 101–111)
Glucose, Bld: 197 mg/dL — ABNORMAL HIGH (ref 65–99)
Potassium: 4.5 mmol/L (ref 3.5–5.1)
SODIUM: 141 mmol/L (ref 135–145)

## 2015-07-31 LAB — HEMOGLOBIN: HEMOGLOBIN: 12.7 g/dL — AB (ref 13.0–18.0)

## 2015-07-31 MED ORDER — DIPHENHYDRAMINE HCL 25 MG PO CAPS
25.0000 mg | ORAL_CAPSULE | Freq: Four times a day (QID) | ORAL | Status: DC | PRN
Start: 2015-07-31 — End: 2015-08-06
  Administered 2015-07-31: 25 mg via ORAL
  Filled 2015-07-31: qty 1

## 2015-07-31 MED ORDER — LEVOFLOXACIN 750 MG PO TABS
750.0000 mg | ORAL_TABLET | Freq: Every day | ORAL | Status: DC
  Administered 2015-07-31 – 2015-08-02 (×3): 750 mg via ORAL
  Filled 2015-07-31 (×4): qty 1

## 2015-07-31 MED ORDER — ZOLPIDEM TARTRATE 5 MG PO TABS: 5.0000 mg | ORAL_TABLET | Freq: Every evening | ORAL | Status: DC | PRN

## 2015-07-31 NOTE — Progress Notes (Signed)
Patient ID: Jeremiah [name], male   DOB: Jan 21, 1951, 65 y.o.   MRN: [id number] [facility] PROGRESS NOTE  Jeremiah [name] [id number]:[id number] DOB: 02-Dec-1950 DOA: 07/28/2015 PCP: No primary care provider on file.  HPI/Subjective: Patient still feels short of breath intermittently and coughing. He had a bowel movement yesterday. He is very happy that his kidney function has normalized  Objective: Filed Vitals:   07/31/15 0457 07/31/15 0726  BP: 135/74 127/81  Pulse: 92 83  Temp: 97.5 F (36.4 C) 97.3 F (36.3 C)  Resp: 20 18    Filed Weights   07/28/15 1040  Weight: 107.956 kg (238 lb)    ROS: Review of Systems  Constitutional: Negative for fever and chills.  Eyes: Negative for blurred vision.  Respiratory: Positive for cough, sputum production, shortness of breath and wheezing.   Cardiovascular: Negative for chest pain.  Gastrointestinal: Negative for nausea, vomiting, abdominal pain, diarrhea and constipation.  Genitourinary: Negative for dysuria.  Musculoskeletal: Negative for joint pain.  Neurological: Negative for dizziness and headaches.   Exam: Physical Exam  Constitutional: He is oriented to person, place, and time.  HENT:  Nose: No mucosal edema.  Mouth/Throat: No oropharyngeal exudate or posterior oropharyngeal edema.  Eyes: Conjunctivae, EOM and lids are normal. Pupils are equal, round, and reactive to light.  Neck: No JVD present. Carotid bruit is not present. No edema present. No thyroid mass and no thyromegaly present.  Cardiovascular: S1 normal and S2 normal.  Exam reveals no gallop.   Murmur heard.  Systolic murmur is present with a grade of 2/6  Pulses:      Dorsalis pedis pulses are 2+ on the right side, and 2+ on the left side.  Respiratory: No respiratory distress. He has decreased breath sounds in the right middle field, the right lower field, the left middle field and the left lower field. He has wheezes in the right middle field, the  right lower field, the left middle field and the left lower field. He has no rhonchi. He has no rales.  GI: Soft. Bowel sounds are normal. There is no tenderness.  Musculoskeletal:       Right ankle: He exhibits swelling.       Left ankle: He exhibits swelling.  Lymphadenopathy:    He has no cervical adenopathy.  Neurological: He is alert and oriented to person, place, and time. No cranial nerve deficit.  Skin: Skin is warm. No rash noted. Nails show no clubbing.  Psychiatric: He has a normal mood and affect.      Data Reviewed: Basic Metabolic Panel:  Recent Labs Lab 07/28/15 1046 07/29/15 0800 07/30/15 0529 07/31/15 0347  NA 131* 133* 138 141  K 4.1 4.1 4.3 4.5  CL 93* 101 106 107  CO2 21* '22 26 30  '$ GLUCOSE 159* 150* 174* 197*  BUN 106* 87* 64* 40*  CREATININE 3.37* 1.48* 0.97 0.84  CALCIUM 7.9* 8.3* 8.5* 8.3*   LiCBC:  Recent Labs Lab 07/28/15 1046 07/29/15 0552 07/31/15 0347  WBC 19.6* 15.2*  --   HGB 14.6 12.9* 12.7*  HCT 43.0 38.2*  --   MCV 86.9 86.9  --   PLT 485* 402  --    Cardiac Enzymes:  Recent Labs Lab 07/28/15 1046  CKTOTAL 204     Recent Results (from the past 240 hour(s))  Rapid Influenza A&B Antigens ([facility] only)     Status: None   Collection Time: 07/28/15  1:00 PM  Result Value Ref Range  Status   Influenza A ([facility]) NEGATIVE NEGATIVE Final   Influenza B ([facility]) NEGATIVE NEGATIVE Final  CULTURE, BLOOD (ROUTINE X 2) w Reflex to PCR ID Panel     Status: None (Preliminary result)   Collection Time: 07/28/15  3:06 PM  Result Value Ref Range Status   Specimen Description BLOOD LEFT ASSIST CONTROL  Final   Special Requests   Final    BOTTLES DRAWN AEROBIC AND ANAEROBIC 6CC AERO 10CC ANA   Culture NO GROWTH 3 DAYS  Final   Report Status PENDING  Incomplete  CULTURE, BLOOD (ROUTINE X 2) w Reflex to PCR ID Panel     Status: None (Preliminary result)   Collection Time: 07/28/15  3:30 PM  Result Value Ref Range Status   Specimen Description  BLOOD LEFT WRIST  Final   Special Requests   Final    BOTTLES DRAWN AEROBIC AND ANAEROBIC [place] AERO 5CC ANA   Culture NO GROWTH 3 DAYS  Final   Report Status PENDING  Incomplete    Scheduled Meds: . benzonatate  200 mg Oral TID  . enoxaparin (LOVENOX) injection  40 mg Subcutaneous Q24H  . ipratropium-albuterol  3 mL Nebulization Q4H  . levofloxacin  750 mg Oral Daily  . methylPREDNISolone (SOLU-MEDROL) injection  40 mg Intravenous 3 times per day  . mometasone-formoterol  2 puff Inhalation BID  . nicotine  21 mg Transdermal Daily  . pantoprazole  40 mg Oral Daily    Assessment/Plan:  1. Acute respiratory failure with hypoxia. Patient currently qualifies for oxygen at home. I spoke with the care manager to try to set this up just in case the patient goes home over the weekend.  2. COPD exacerbation. Continue IV Solu-Medrol taper ,incentive spirometry.. Continue nebulizer treatments. Levaquin.  3. Acute renal failure. Creatinine normalized, BUN is much better trended down from 64-40. I will stop IV fluids. Encourage oral intake. I will guaiac stools and check a hemoglobin tomorrow morning. 4.   Essential hypertension. Blood pressure acceptable off medications.  Code Status:     Code Status Orders        Start     Ordered   07/28/15 1458  Full code   Continuous     07/28/15 1457    Code Status History    Date Active Date Inactive Code Status Order ID Comments User Context   This patient has a current code status but no historical code status.     Disposition Plan: Home once breathing better  Antibiotics:  Levaquin  Time spent: 35 minutes  Jakaylah Schlafer  [facility]

## 2015-07-31 NOTE — Progress Notes (Signed)
Patient stated that he felt a little tight in his chest and wants a breathing treatment. Called RT and she will be over a little early for his scheduled treatment.

## 2015-07-31 NOTE — Progress Notes (Signed)
PHARMACIST - PHYSICIAN COMMUNICATION DR:   Gouru CONCERNING: Antibiotic IV to Oral Route Change Policy  RECOMMENDATION: This patient is receiving Levaquin by the intravenous route.  Based on criteria approved by the Pharmacy and Therapeutics Committee, the antibiotic(s) is/are being converted to the equivalent oral dose form(s).   DESCRIPTION: These criteria include:  Patient being treated for a respiratory tract infection, urinary tract infection, cellulitis or clostridium difficile associated diarrhea if on metronidazole  The patient is not neutropenic and does not exhibit a GI malabsorption state  The patient is eating (either orally or via tube) and/or has been taking other orally administered medications for a least 24 hours  The patient is improving clinically and has a Tmax < 100.5  If you have questions about this conversion, please contact the Pharmacy Department  []  ( [phone] )  [place] [x]  ( [phone] )  [facility] []  ( [phone] )  [place] []  ( [phone] )  [facility] []  ( [phone] )  [facility]   Kelsee Preslar, PharmD Clinical Pharmacist  

## 2015-08-01 LAB — BASIC METABOLIC PANEL
ANION GAP: 4 — AB (ref 5–15)
BUN: 35 mg/dL — ABNORMAL HIGH (ref 6–20)
CALCIUM: 8.6 mg/dL — AB (ref 8.9–10.3)
CHLORIDE: 103 mmol/L (ref 101–111)
CO2: 31 mmol/L (ref 22–32)
Creatinine, Ser: 0.77 mg/dL (ref 0.61–1.24)
GFR calc non Af Amer: >60 mL/min (ref >60)
GLUCOSE: 161 mg/dL — AB (ref 65–99)
POTASSIUM: 4.7 mmol/L (ref 3.5–5.1)
SODIUM: 138 mmol/L (ref 135–145)

## 2015-08-01 LAB — CBC
HCT: 38.9 % — ABNORMAL LOW (ref 40.0–52.0)
HEMOGLOBIN: 13.2 g/dL (ref 13.0–18.0)
MCH: 29.7 pg (ref 26.0–34.0)
MCHC: 33.8 g/dL (ref 32.0–36.0)
MCV: 87.9 fL (ref 80.0–100.0)
PLATELETS: 352 10*3/uL (ref 150–440)
RBC: 4.43 MIL/uL (ref 4.40–5.90)
RDW: 14.5 % (ref 11.5–14.5)
WBC: 20 10*3/uL — AB (ref 3.8–10.6)

## 2015-08-01 LAB — OCCULT BLOOD X 1 CARD TO LAB, STOOL: Fecal Occult Bld: NEGATIVE

## 2015-08-01 MED ORDER — METHYLPREDNISOLONE SODIUM SUCC 40 MG IJ SOLR
40.0000 mg | Freq: Two times a day (BID) | INTRAMUSCULAR | Status: AC
  Administered 2015-08-01 – 2015-08-04 (×7): 40 mg via INTRAVENOUS
  Filled 2015-08-01 (×7): qty 1

## 2015-08-01 NOTE — Progress Notes (Signed)
Patient ID: Jeremiah [name], male   DOB: 1950-09-20, 65 y.o.   MRN: [id number] [facility] PROGRESS NOTE  Shahram Alexopoulos VVZ:[id number] DOB: 01-26-1951 DOA: 07/28/2015 PCP: No primary care provider on file.  HPI/Subjective: Patient still feels short of breath intermittently and anxious, reporting intermittent coughing.   Objective: Filed Vitals:   08/01/15 0348 08/01/15 0740  BP: 135/72 128/71  Pulse: 88 80  Temp: 97.9 F (36.6 C)   Resp: 18 18    Filed Weights   07/28/15 1040  Weight: 107.956 kg (238 lb)    ROS: Review of Systems  Constitutional: Negative for fever and chills.  Eyes: Negative for blurred vision.  Respiratory: Positive for cough and sputum production. Negative for shortness of breath and wheezing.   Cardiovascular: Negative for chest pain.  Gastrointestinal: Negative for nausea, vomiting, abdominal pain, diarrhea and constipation.  Genitourinary: Negative for dysuria.  Musculoskeletal: Negative for joint pain.  Neurological: Negative for dizziness and headaches.   Exam: Physical Exam  Constitutional: He is oriented to person, place, and time.  HENT:  Nose: No mucosal edema.  Mouth/Throat: No oropharyngeal exudate or posterior oropharyngeal edema.  Eyes: Conjunctivae, EOM and lids are normal. Pupils are equal, round, and reactive to light.  Neck: No JVD present. Carotid bruit is not present. No edema present. No thyroid mass and no thyromegaly present.  Cardiovascular: S1 normal and S2 normal.  Exam reveals no gallop.   Murmur heard.  Systolic murmur is present with a grade of 2/6  Pulses:      Dorsalis pedis pulses are 2+ on the right side, and 2+ on the left side.  Respiratory: No respiratory distress. He has no wheezes. He has no rhonchi. He has no rales.  GI: Soft. Bowel sounds are normal. There is no tenderness.  Musculoskeletal:       Right ankle: He exhibits swelling.       Left ankle: He exhibits swelling.   Lymphadenopathy:    He has no cervical adenopathy.  Neurological: He is alert and oriented to person, place, and time. No cranial nerve deficit.  Skin: Skin is warm. No rash noted. Nails show no clubbing.  Psychiatric: He has a normal mood and affect.      Data Reviewed: Basic Metabolic Panel:  Recent Labs Lab 07/28/15 1046 07/29/15 0800 07/30/15 0529 07/31/15 0347 08/01/15 0404  NA 131* 133* 138 141 138  K 4.1 4.1 4.3 4.5 4.7  CL 93* 101 106 107 103  CO2 21* '22 26 30 31  '$ GLUCOSE 159* 150* 174* 197* 161*  BUN 106* 87* 64* 40* 35*  CREATININE 3.37* 1.48* 0.97 0.84 0.77  CALCIUM 7.9* 8.3* 8.5* 8.3* 8.6*   LiCBC:  Recent Labs Lab 07/28/15 1046 07/29/15 0552 07/31/15 0347 08/01/15 0404  WBC 19.6* 15.2*  --  20.0*  HGB 14.6 12.9* 12.7* 13.2  HCT 43.0 38.2*  --  38.9*  MCV 86.9 86.9  --  87.9  PLT 485* 402  --  352   Cardiac Enzymes:  Recent Labs Lab 07/28/15 1046  CKTOTAL 204     Recent Results (from the past 240 hour(s))  Rapid Influenza A&B Antigens ([facility] only)     Status: None   Collection Time: 07/28/15  1:00 PM  Result Value Ref Range Status   Influenza A ([facility]) NEGATIVE NEGATIVE Final   Influenza B ([facility]) NEGATIVE NEGATIVE Final  CULTURE, BLOOD (ROUTINE X 2) w Reflex to PCR ID Panel     Status: None (  Preliminary result)   Collection Time: 07/28/15  3:06 PM  Result Value Ref Range Status   Specimen Description BLOOD LEFT ASSIST CONTROL  Final   Special Requests   Final    BOTTLES DRAWN AEROBIC AND ANAEROBIC 6CC AERO 10CC ANA   Culture NO GROWTH 3 DAYS  Final   Report Status PENDING  Incomplete  CULTURE, BLOOD (ROUTINE X 2) w Reflex to PCR ID Panel     Status: None (Preliminary result)   Collection Time: 07/28/15  3:30 PM  Result Value Ref Range Status   Specimen Description BLOOD LEFT WRIST  Final   Special Requests   Final    BOTTLES DRAWN AEROBIC AND ANAEROBIC [place] AERO 5CC ANA   Culture NO GROWTH 3 DAYS  Final   Report Status PENDING   Incomplete    Scheduled Meds: . benzonatate  200 mg Oral TID  . enoxaparin (LOVENOX) injection  40 mg Subcutaneous Q24H  . ipratropium-albuterol  3 mL Nebulization Q4H  . levofloxacin  750 mg Oral Daily  . methylPREDNISolone (SOLU-MEDROL) injection  40 mg Intravenous Q12H  . mometasone-formoterol  2 puff Inhalation BID  . nicotine  21 mg Transdermal Daily  . pantoprazole  40 mg Oral Daily    Assessment/Plan:  1. Acute respiratory failure with hypoxia. Patient currently qualifies for oxygen at home. I spoke with the care manager to try to set this up just in case the patient goes home   2. COPD exacerbation.  IV Solu-Medrol taper to by mouth prednisone ,incentive spirometry.. Continue nebulizer treatments. Levaquin.  Acute renal failure. Creatinine normalized, BUN is much better trended down from 64-40--35. I will stop IV fluids. Encourage oral intake. FOBT negative   4.   Essential hypertension. Blood pressure acceptable off medications.  Code Status:     Code Status Orders        Start     Ordered   07/28/15 1458  Full code   Continuous     07/28/15 1457    Code Status History    Date Active Date Inactive Code Status Order ID Comments User Context   This patient has a current code status but no historical code status.     Disposition Plan: Home once breathing better  Antibiotics:  Levaquin  Time spent: 28 minutes  Raela Bohl  [facility] [place] [facility]

## 2015-08-01 NOTE — Care Management Note (Signed)
Case Management Note  Patient Details  Name: Jeremiah [name] MRN: [id number] Date of Birth: 05-18-1950  Subjective/Objective:    [name] has a portable oxygen tank at bedside from [facility]. No discharge today. Handoff note left for CM to notify [facility] on Monday to set-up new home oxygen id discharges Monday.                 Action/Plan:   Expected Discharge Date:                  Expected Discharge Plan:     In-House Referral:     Discharge planning Services     Post Acute Care Choice:    Choice offered to:     DME Arranged:    DME Agency:     HH Arranged:    [place] Agency:     Status of Service:     Medicare Important Message Given:  Yes Date Medicare IM Given:    Medicare IM give by:    Date Additional Medicare IM Given:    Additional Medicare Important Message give by:     If discussed at [place] of Stay Meetings, dates discussed:    Additional Comments:  Tiwan Schnitker A, RN 08/01/2015, 10:48 AM

## 2015-08-02 DIAGNOSIS — J441 Chronic obstructive pulmonary disease with (acute) exacerbation: Secondary | ICD-10-CM

## 2015-08-02 LAB — CULTURE, BLOOD (ROUTINE X 2)
Culture: NO GROWTH
Culture: NO GROWTH

## 2015-08-02 MED ORDER — UMECLIDINIUM BROMIDE 62.5 MCG/INH IN AEPB
1.0000 | INHALATION_SPRAY | Freq: Every day | RESPIRATORY_TRACT | Status: DC
  Administered 2015-08-03 – 2015-08-06 (×4): 1 via RESPIRATORY_TRACT
  Filled 2015-08-02 (×2): qty 7

## 2015-08-02 NOTE — Care Management Important Message (Signed)
Important Message  Patient Details  Name: Jeremiah [name] MRN: [id number] Date of Birth: 04-Jan-1951   Medicare Important Message Given:  Yes    Marshell Garfinkel, RN 08/02/2015, 2:01 PM

## 2015-08-02 NOTE — Care Management (Signed)
RN please provide new O2 assessment note as the last one has expired. Thank you. I will update [facility]. Referred patient to [facility] for follow up.

## 2015-08-02 NOTE — Progress Notes (Signed)
Pharmacy Antibiotic Note  Jeremiah [name] is a 65 y.o. male admitted on 07/28/2015 with COPD exacerbation.  Pharmacy consulted for levofloxacin dosing.   Patient is currently on day #6 of levofloxacin   Plan: Continue levofloxacin 750 mg PO daily   Height: '6\' 1"'$  (185.4 cm) Weight: 238 lb (107.956 kg) IBW/kg (Calculated) : 79.9  Temp (24hrs), Avg:97.9 F (36.6 C), Min:97.6 F (36.4 C), Max:98.2 F (36.8 C)   Recent Labs Lab 07/28/15 1046 07/29/15 0552 07/29/15 0800 07/30/15 0529 07/31/15 0347 08/01/15 0404  WBC 19.6* 15.2*  --   --   --  20.0*  CREATININE 3.37*  --  1.48* 0.97 0.84 0.77    Estimated Creatinine Clearance: 120.2 mL/min (by C-G formula based on Cr of 0.77).    Allergies  Allergen Reactions  . Penicillins Anaphylaxis, Swelling and Other (See Comments)    Has patient had a PCN reaction causing immediate rash, facial/tongue/throat swelling, SOB or lightheadedness with hypotension: Yes Has patient had a PCN reaction causing severe rash involving mucus membranes or skin necrosis: No Has patient had a PCN reaction that required hospitalization No Has patient had a PCN reaction occurring within the last 10 years: No If all of the above answers are "NO", then may proceed with Cephalosporin use.    Antimicrobials this admission: 3/22 levofloxacin >>   Microbiology results: 3/22 BCx: No growth 3 days  Thank you for allowing pharmacy to be a part of this patient's care.  Lenis Noon, PharmD Clinical Pharmacist 08/02/2015 9:29 AM

## 2015-08-02 NOTE — Progress Notes (Signed)
Patient ID: Jeremiah [name], male   DOB: [birth year], 65 y.o.   MRN: [id number] [facility] PROGRESS NOTE  Jeremiah [name] [id number]:[id number] DOB: 04-23-1951 DOA: 07/28/2015 PCP: No primary care provider on file.  HPI/Subjective: Patient is tachycardic and hypoxic with ambulation , reporting intermittent cough  Objective: Filed Vitals:   08/02/15 0755 08/02/15 1450  BP: 129/72 145/82  Pulse: 81 98  Temp: 98.2 F (36.8 C)   Resp: 18 18    Filed Weights   07/28/15 1040  Weight: 107.956 kg (238 lb)    ROS: Review of Systems  Constitutional: Negative for fever and chills.  Eyes: Negative for blurred vision.  Respiratory: Positive for cough and sputum production. Negative for shortness of breath and wheezing.   Cardiovascular: Negative for chest pain.  Gastrointestinal: Negative for nausea, vomiting, abdominal pain, diarrhea and constipation.  Genitourinary: Negative for dysuria.  Musculoskeletal: Negative for joint pain.  Neurological: Negative for dizziness and headaches.   Exam: Physical Exam  Constitutional: He is oriented to person, place, and time.  HENT:  Nose: No mucosal edema.  Mouth/Throat: No oropharyngeal exudate or posterior oropharyngeal edema.  Eyes: Conjunctivae, EOM and lids are normal. Pupils are equal, round, and reactive to light.  Neck: No JVD present. Carotid bruit is not present. No edema present. No thyroid mass and no thyromegaly present.  Cardiovascular: S1 normal and S2 normal.  Exam reveals no gallop.   Murmur heard.  Systolic murmur is present with a grade of 2/6  Pulses:      Dorsalis pedis pulses are 2+ on the right side, and 2+ on the left side.  Respiratory: No respiratory distress. He has no wheezes. He has no rhonchi. He has no rales.  GI: Soft. Bowel sounds are normal. There is no tenderness.  Musculoskeletal:       Right ankle: He exhibits swelling.       Left ankle: He exhibits swelling.  Lymphadenopathy:    He has no  cervical adenopathy.  Neurological: He is alert and oriented to person, place, and time. No cranial nerve deficit.  Skin: Skin is warm. No rash noted. Nails show no clubbing.  Psychiatric: He has a normal mood and affect.      Data Reviewed: Basic Metabolic Panel:  Recent Labs Lab 07/28/15 1046 07/29/15 0800 07/30/15 0529 07/31/15 0347 08/01/15 0404  NA 131* 133* 138 141 138  K 4.1 4.1 4.3 4.5 4.7  CL 93* 101 106 107 103  CO2 21* '22 26 30 31  '$ GLUCOSE 159* 150* 174* 197* 161*  BUN 106* 87* 64* 40* 35*  CREATININE 3.37* 1.48* 0.97 0.84 0.77  CALCIUM 7.9* 8.3* 8.5* 8.3* 8.6*   LiCBC:  Recent Labs Lab 07/28/15 1046 07/29/15 0552 07/31/15 0347 08/01/15 0404  WBC 19.6* 15.2*  --  20.0*  HGB 14.6 12.9* 12.7* 13.2  HCT 43.0 38.2*  --  38.9*  MCV 86.9 86.9  --  87.9  PLT 485* 402  --  352   Cardiac Enzymes:  Recent Labs Lab 07/28/15 1046  CKTOTAL 204     Recent Results (from the past 240 hour(s))  Rapid Influenza A&B Antigens ([facility] only)     Status: None   Collection Time: 07/28/15  1:00 PM  Result Value Ref Range Status   Influenza A ([facility]) NEGATIVE NEGATIVE Final   Influenza B ([facility]) NEGATIVE NEGATIVE Final  CULTURE, BLOOD (ROUTINE X 2) w Reflex to PCR ID Panel     Status: None  Collection Time: 07/28/15  3:06 PM  Result Value Ref Range Status   Specimen Description BLOOD LEFT ASSIST CONTROL  Final   Special Requests   Final    BOTTLES DRAWN AEROBIC AND ANAEROBIC 6CC AERO 10CC ANA   Culture NO GROWTH 5 DAYS  Final   Report Status 08/02/2015 FINAL  Final  CULTURE, BLOOD (ROUTINE X 2) w Reflex to PCR ID Panel     Status: None   Collection Time: 07/28/15  3:30 PM  Result Value Ref Range Status   Specimen Description BLOOD LEFT WRIST  Final   Special Requests   Final    BOTTLES DRAWN AEROBIC AND ANAEROBIC [place] AERO 5CC ANA   Culture NO GROWTH 5 DAYS  Final   Report Status 08/02/2015 FINAL  Final    Scheduled Meds: . benzonatate  200 mg Oral TID  .  enoxaparin (LOVENOX) injection  40 mg Subcutaneous Q24H  . ipratropium-albuterol  3 mL Nebulization Q4H  . methylPREDNISolone (SOLU-MEDROL) injection  40 mg Intravenous Q12H  . mometasone-formoterol  2 puff Inhalation BID  . nicotine  21 mg Transdermal Daily  . pantoprazole  40 mg Oral Daily  . umeclidinium bromide  1 puff Inhalation Daily    Assessment/Plan:  1. Acute respiratory failure with hypoxia. Patient currently qualifies for oxygen at home. I spoke with the care manager to try to set this up just in case the patient goes home   2. COPD exacerbation. With exertional tachycardia Deconditioning each shift. IV Solu-Medrol taped r to by mouth prednisone ,incentive spirometry.. Continue nebulizer treatments. Levaquin. Appreciate pul recommendations  Acute renal failure. Creatinine normalized, BUN is much better trended down from 64-40--35. D/ced IV fluids. Encourage oral intake. FOBT negative   4.   Essential hypertension. Blood pressure acceptable off medications.  Code Status:     Code Status Orders        Start     Ordered   07/28/15 1458  Full code   Continuous     07/28/15 1457    Code Status History    Date Active Date Inactive Code Status Order ID Comments User Context   This patient has a current code status but no historical code status.     Disposition Plan: Home once breathing better  Antibiotics:  Levaquin  Time spent: 28 minutes  Darryon Bastin  [facility]

## 2015-08-02 NOTE — Progress Notes (Addendum)
SATURATION QUALIFICATIONS: (This note is used to comply with regulatory documentation for home oxygen)  Patient Saturations on Room Air at Rest = 91%   HR-86  Patient Saturations on Room Air while Ambulating = 88%  HR-128  Patient Saturations on 2 Liters of oxygen while Ambulating = 91%  HR- 125  Please briefly explain why patient needs home oxygen: SOB with exertion, increased HR with activity.

## 2015-08-02 NOTE — Consult Note (Addendum)
Jeremiah [name]      Assessment and Plan:    Acute respiratory failure due to acute exacerbation of COPD with acute bronchitis. -Patient appears improved at this time. Can likely discharge home tomorrow from respiratory standpoint, may require ambulatory oxygen.  Chronic respiratory failure, severe emphysema. -Continue the patient's usual outpatient regimen upon discharge, which includes Advair, tudorza, DuoNeb's. -Follow-up outpatient with pulmonary, the patient was given my card. He will require split function testing, and would benefit from outpatient referral to rehabilitation.  Tobacco abuse. -Discussed importance of smoking cessation, the patient is committed to stopping smoking.  Chronic debility, deconditioning. -Would benefit from pulmonary rehabilitation.    Date: 08/02/2015  MRN# [id number] Jeremiah [name] [birth year]  Referring Physician: Dr. Margaretmary Eddy.   Jeremiah [name] is a 65 y.o. old male seen in [name] for chief complaint of: dyspnea   Chief Complaint  Patient presents with  . Shortness of Breath    HPI:   The patient is a 65 year old male with a history of COPD. He tells me that he was diagnosed with COPD approximately 2 years ago when he was admitted to the hospital. He is a smoker and has smoked as much as 2 packs a day, more recently, he smokes 1/2-1 pack a day He tells me that his baseline dyspnea is moderate to severe. He has a flight of stairs in his home, but can only walk about half way up before having to rest. Then we'll complete the other half of the stairway. He often drives with his roommates to the grocery store, but does not go inside because he becomes very dyspneic upon walking the parking lot to the store. He has not been on oxygen at home the past. His dyspnea is made worse with activity, and improves with rest. He does not have any problems with associated palpitations or chest pains. He was planning on  being discharged today, however, upon ambulating. The patient, it appeared that his heart rate increased into the 120s to 130s range.  I reviewed the patient's CT chest and chest x-ray images and reports from this admission. These are consistent with very severe emphysematous changes in the lungs.  PMHX:   Past Medical History  Diagnosis Date  . COPD (chronic obstructive pulmonary disease) ([place])   . GERD (gastroesophageal reflux disease)   . HTN (hypertension)    Surgical Hx:  Past Surgical History  Procedure Laterality Date  . Hernia repair    . Neck surgery     Family Hx:  Family History  Problem Relation Age of Onset  . Cancer - Colon Father   . Congestive Heart Failure Mother    Social Hx:   Social History  Substance Use Topics  . Smoking status: Current Every Day Smoker -- 0.50 packs/day    Types: Cigarettes  . Smokeless tobacco: None  . Alcohol Use: 0.0 oz/week    0 Standard drinks or equivalent per week     Comment: beers occasionally   Medication:   No current outpatient prescriptions on file.    Allergies:  Penicillins  Review of Systems: Gen:  Denies  fever, sweats, chills HEENT: Denies blurred vision, double vision. bleeds, sore throat Cvc:  No dizziness, chest pain. Resp:   Denies cough or sputum production, shortness of breath Gi: Denies swallowing difficulty, stomach pain. Gu:  Denies bladder incontinence, burning urine Ext:   No Joint pain, stiffness. Skin: No skin rash,  hives  Endoc:  No polyuria, polydipsia.  Psych: No depression, insomnia. Other:  All other systems were reviewed with the patient and were negative other that what is mentioned in the HPI.   Physical Examination:   VS: BP 145/82 mmHg  Pulse 98  Temp(Src) 98.2 F (36.8 C) (Oral)  Resp 18  Ht '6\' 1"'$  (1.854 m)  Wt 238 lb (107.956 kg)  BMI 31.41 kg/m2  SpO2 96%  General Appearance: No distress  Neuro:without focal findings,  speech normal,  HEENT: PERRLA, EOM intact.     Pulmonary: Decreased bilateral air entry. No audible wheezing. CardiovascularNormal S1,S2.  No m/r/g.   Abdomen: Benign, Soft, non-tender. Renal:  No costovertebral tenderness  GU:  No performed at this time. Endoc: No evident thyromegaly, no signs of acromegaly. Skin:   warm, no rashes, no ecchymosis  Extremities: normal, no cyanosis, clubbing.  Other findings:    LABORATORY PANEL:   CBC  Recent Labs Lab 08/01/15 0404  WBC 20.0*  HGB 13.2  HCT 38.9*  PLT 352   ------------------------------------------------------------------------------------------------------------------  Chemistries   Recent Labs Lab 08/01/15 0404  NA 138  K 4.7  CL 103  CO2 31  GLUCOSE 161*  BUN 35*  CREATININE 0.77  CALCIUM 8.6*   ------------------------------------------------------------------------------------------------------------------  Cardiac Enzymes No results for input(s): TROPONINI in the last 168 hours. ------------------------------------------------------------  RADIOLOGY:  No results found.     Thank  you for the [name] and for allowing [facility] to assist in the care of your patient. Our recommendations are noted above.  Please contact us if we can be of further service.   Marda Stalker, MD.  Board Certified in Internal Medicine, Pulmonary Medicine, [place], and Sleep Medicine.  [facility] Office Number: [phone]  Patricia Pesa, M.D.  Vilinda Boehringer, M.D.  Merton Border, M.D  08/02/2015

## 2015-08-03 ENCOUNTER — Telehealth: Payer: Self-pay | Admitting: *Deleted

## 2015-08-03 ENCOUNTER — Inpatient Hospital Stay: Payer: Medicaid Other

## 2015-08-03 DIAGNOSIS — J9621 Acute and chronic respiratory failure with hypoxia: Secondary | ICD-10-CM

## 2015-08-03 DIAGNOSIS — J439 Emphysema, unspecified: Secondary | ICD-10-CM

## 2015-08-03 DIAGNOSIS — Z720 Tobacco use: Secondary | ICD-10-CM

## 2015-08-03 LAB — CBC WITH DIFFERENTIAL/PLATELET
BASOS ABS: 0 10*3/uL (ref 0–0.1)
Basophils Relative: 0 %
EOS ABS: 0 10*3/uL (ref 0–0.7)
EOS PCT: 0 %
HCT: 41.2 % (ref 40.0–52.0)
Hemoglobin: 13.8 g/dL (ref 13.0–18.0)
LYMPHS ABS: 1.1 10*3/uL (ref 1.0–3.6)
LYMPHS PCT: 7 %
MCH: 29.6 pg (ref 26.0–34.0)
MCHC: 33.5 g/dL (ref 32.0–36.0)
MCV: 88.3 fL (ref 80.0–100.0)
MONO ABS: 0.5 10*3/uL (ref 0.2–1.0)
MONOS PCT: 3 %
Neutro Abs: 14.5 10*3/uL — ABNORMAL HIGH (ref 1.4–6.5)
Neutrophils Relative %: 90 %
PLATELETS: 333 10*3/uL (ref 150–440)
RBC: 4.67 MIL/uL (ref 4.40–5.90)
RDW: 14.2 % (ref 11.5–14.5)
WBC: 16.1 10*3/uL — AB (ref 3.8–10.6)

## 2015-08-03 LAB — BASIC METABOLIC PANEL
ANION GAP: 8 (ref 5–15)
BUN: 25 mg/dL — ABNORMAL HIGH (ref 6–20)
CALCIUM: 8.7 mg/dL — AB (ref 8.9–10.3)
CO2: 27 mmol/L (ref 22–32)
Chloride: 92 mmol/L — ABNORMAL LOW (ref 101–111)
Creatinine, Ser: 0.66 mg/dL (ref 0.61–1.24)
GLUCOSE: 120 mg/dL — AB (ref 65–99)
POTASSIUM: 5.1 mmol/L (ref 3.5–5.1)
SODIUM: 127 mmol/L — AB (ref 135–145)

## 2015-08-03 MED ORDER — DILTIAZEM HCL ER COATED BEADS 120 MG PO CP24
120.0000 mg | ORAL_CAPSULE | Freq: Every day | ORAL | Status: DC
  Administered 2015-08-03 – 2015-08-05 (×3): 120 mg via ORAL
  Filled 2015-08-03 (×3): qty 1

## 2015-08-03 NOTE — Progress Notes (Addendum)
Disheveled. No new complaints. Remains very limited due to dyspnea but this is not far from his baseline  Filed Vitals:   08/03/15 0848 08/03/15 1059 08/03/15 1104 08/03/15 1106  BP:      Pulse:    127  Temp:      TempSrc:      Resp:      Height:      Weight:      SpO2: 95% 91% 86% 92%   Unkempt appearance, NAD HEENT WNL No JVD or LAN Markedly diminished BS, no wheezes Tachy, reg, no M NABS, soft No C/C/E  BMP Latest Ref Rng 08/01/2015 07/31/2015 07/30/2015  Glucose 65 - 99 mg/dL 161(H) 197(H) 174(H)  BUN 6 - 20 mg/dL 35(H) 40(H) 64(H)  Creatinine 0.61 - 1.24 mg/dL 0.77 0.84 0.97  Sodium 135 - 145 mmol/L 138 141 138  Potassium 3.5 - 5.1 mmol/L 4.7 4.5 4.3  Chloride 101 - 111 mmol/L 103 107 106  CO2 22 - 32 mmol/L '31 30 26  '$ Calcium 8.9 - 10.3 mg/dL 8.6(L) 8.3(L) 8.5(L)    CBC Latest Ref Rng 08/01/2015 07/31/2015 07/29/2015  WBC 3.8 - 10.6 K/uL 20.0(H) - 15.2(H)  Hemoglobin 13.0 - 18.0 g/dL 13.2 12.7(L) 12.9(L)  Hematocrit 40.0 - 52.0 % 38.9(L) - 38.2(L)  Platelets 150 - 440 K/uL 352 - 402    CXR: Kyphosis, flattened diaphragms, interstitial prominence  IMPRESSION: Acute on chronic hypoxic respiratory failure Severe COPD/emphysema with acute exacerbation Smoker Chronic severe deconditioning Sinus tachycardia - likely reactive  PLAN/REC: Discussed with Dr Margaretmary Eddy Continue the patient's usual outpatient regimen upon discharge, which includes Advair, tudorza, DuoNeb's. Taper prednisone to off over 5-7 days Will need home O2 after discharge Counseled re: smoking cessation Suggest diltiazem 120 mg daily or tachycardia Outpt follow-up with pulmonary has been arranged with Dr Ashby Dawes  PCCM will sign off. Please call if we can be of further assistance  Merton Border, MD PCCM service Mobile [phone] Pager [phone] 08/03/2015

## 2015-08-03 NOTE — Telephone Encounter (Signed)
-----   Message from Laverle Hobby, MD sent at 08/02/2015  7:10 PM EDT ----- Regarding: hfu Please arrange hfu with me in about 2-4 weeks.

## 2015-08-03 NOTE — Telephone Encounter (Signed)
Spoke with Freda Munro on the unit and gave f/u appt for 08/19/15 @ 1:40pm. Nothing further needed.

## 2015-08-03 NOTE — Discharge Planning (Addendum)
Dr. Ree Kida of patient's HR and SPO2 levels with ambulation (per Dr. Request). Dr. Aletha Halim discuss with pulm, but pretty sure not discharging today d/t  ambulation HR of 127.

## 2015-08-03 NOTE — Progress Notes (Signed)
Patient had started to walk, he got to the door of his room and stated he had started to feel as if he was going to faint. Patient was escorted back to the bed, O2 sats was checked and reading 86% on room air. Patient was put back on oxygen and quickly came back up to a normal O2 reading. Will continue to monitor.

## 2015-08-03 NOTE — Progress Notes (Signed)
Patient ID: Jeremiah [name], male   DOB: [birth year], 65 y.o.   MRN: [id number] [facility] PROGRESS NOTE  Julious Langlois [id number]:[id number] DOB: 05/14/1950 DOA: 07/28/2015 PCP: No primary care provider on file.  HPI/Subjective: Patient is tachycardic , heart rate up to 125 with ambulation , feeling tired minimal ambulation, reporting intermittent cough  Objective: Filed Vitals:   08/03/15 0743 08/03/15 1106  BP: 130/70   Pulse: 84 127  Temp: 98.5 F (36.9 C)   Resp: 18     Filed Weights   07/28/15 1040  Weight: 107.956 kg (238 lb)    ROS: Review of Systems  Constitutional: Negative for fever and chills.  Eyes: Negative for blurred vision.  Respiratory: Positive for cough and sputum production. Negative for shortness of breath and wheezing.   Cardiovascular: Negative for chest pain.  Gastrointestinal: Negative for nausea, vomiting, abdominal pain, diarrhea and constipation.  Genitourinary: Negative for dysuria.  Musculoskeletal: Negative for joint pain.  Neurological: Negative for dizziness and headaches.   Exam: Physical Exam  Constitutional: He is oriented to person, place, and time.  HENT:  Nose: No mucosal edema.  Mouth/Throat: No oropharyngeal exudate or posterior oropharyngeal edema.  Eyes: Conjunctivae, EOM and lids are normal. Pupils are equal, round, and reactive to light.  Neck: No JVD present. Carotid bruit is not present. No edema present. No thyroid mass and no thyromegaly present.  Cardiovascular: S1 normal and S2 normal.  Exam reveals no gallop.   Murmur heard.  Systolic murmur is present with a grade of 2/6  Pulses:      Dorsalis pedis pulses are 2+ on the right side, and 2+ on the left side.  Respiratory: No respiratory distress. He has no wheezes. He has no rhonchi. He has no rales.  GI: Soft. Bowel sounds are normal. There is no tenderness.  Musculoskeletal:       Right ankle: He exhibits swelling.       Left ankle: He exhibits  swelling.  Lymphadenopathy:    He has no cervical adenopathy.  Neurological: He is alert and oriented to person, place, and time. No cranial nerve deficit.  Skin: Skin is warm. No rash noted. Nails show no clubbing.  Psychiatric: He has a normal mood and affect.      Data Reviewed: Basic Metabolic Panel:  Recent Labs Lab 07/28/15 1046 07/29/15 0800 07/30/15 0529 07/31/15 0347 08/01/15 0404  NA 131* 133* 138 141 138  K 4.1 4.1 4.3 4.5 4.7  CL 93* 101 106 107 103  CO2 21* '22 26 30 31  '$ GLUCOSE 159* 150* 174* 197* 161*  BUN 106* 87* 64* 40* 35*  CREATININE 3.37* 1.48* 0.97 0.84 0.77  CALCIUM 7.9* 8.3* 8.5* 8.3* 8.6*   LiCBC:  Recent Labs Lab 07/28/15 1046 07/29/15 0552 07/31/15 0347 08/01/15 0404  WBC 19.6* 15.2*  --  20.0*  HGB 14.6 12.9* 12.7* 13.2  HCT 43.0 38.2*  --  38.9*  MCV 86.9 86.9  --  87.9  PLT 485* 402  --  352   Cardiac Enzymes:  Recent Labs Lab 07/28/15 1046  CKTOTAL 204     Recent Results (from the past 240 hour(s))  Rapid Influenza A&B Antigens ([facility] only)     Status: None   Collection Time: 07/28/15  1:00 PM  Result Value Ref Range Status   Influenza A ([facility]) NEGATIVE NEGATIVE Final   Influenza B ([facility]) NEGATIVE NEGATIVE Final  CULTURE, BLOOD (ROUTINE X 2) w Reflex to PCR ID Panel  Status: None   Collection Time: 07/28/15  3:06 PM  Result Value Ref Range Status   Specimen Description BLOOD LEFT ASSIST CONTROL  Final   Special Requests   Final    BOTTLES DRAWN AEROBIC AND ANAEROBIC 6CC AERO 10CC ANA   Culture NO GROWTH 5 DAYS  Final   Report Status 08/02/2015 FINAL  Final  CULTURE, BLOOD (ROUTINE X 2) w Reflex to PCR ID Panel     Status: None   Collection Time: 07/28/15  3:30 PM  Result Value Ref Range Status   Specimen Description BLOOD LEFT WRIST  Final   Special Requests   Final    BOTTLES DRAWN AEROBIC AND ANAEROBIC [place] AERO 5CC ANA   Culture NO GROWTH 5 DAYS  Final   Report Status 08/02/2015 FINAL  Final    Scheduled  Meds: . benzonatate  200 mg Oral TID  . diltiazem  120 mg Oral Daily  . enoxaparin (LOVENOX) injection  40 mg Subcutaneous Q24H  . ipratropium-albuterol  3 mL Nebulization Q4H  . methylPREDNISolone (SOLU-MEDROL) injection  40 mg Intravenous Q12H  . mometasone-formoterol  2 puff Inhalation BID  . nicotine  21 mg Transdermal Daily  . pantoprazole  40 mg Oral Daily  . umeclidinium bromide  1 puff Inhalation Daily    Assessment/Plan:  1. Acute respiratory failure with hypoxia. Patient currently qualifies for oxygen at home. I spoke with the care manager to try to set this up just in case the patient goes home   2. COPD exacerbation. With exertional tachycardia Deconditioning each shift. IV Solu-Medrol taped r to by mouth prednisone ,incentive spirometry.. Continue nebulizer treatments. Levaquin. Appreciate pul recommendations Cardizem CD 120 mg is added to the regimen cxr ordered ekg  Acute renal failure. Creatinine normalized, BUN is much better trended down from 64-40--35. D/ced IV fluids. Encourage oral intake. FOBT negative   4.   Essential hypertension. Blood pressure acceptable off medications.  5. Deconditioning with physical therapy is ordered, requested 2 times so far  Code Status:     Code Status Orders        Start     Ordered   07/28/15 1458  Full code   Continuous     07/28/15 1457    Code Status History    Date Active Date Inactive Code Status Order ID Comments User Context   This patient has a current code status but no historical code status.     Disposition Plan: Home once breathing better  Antibiotics:  Levaquin  Time spent: 28 minutes  Sheri Prows  [facility]

## 2015-08-03 NOTE — Discharge Planning (Addendum)
Pt Oxygen SPO2 was 91% RA at rest. Pt Oxygen SPO2 was 86% RA on ambulation  Pt Oxygen SPO2 was above 90% on 2L-[place] on ambulation but HR increased to 127. Pt will need oxygen at discharge if discharged today.

## 2015-08-04 ENCOUNTER — Inpatient Hospital Stay
Admit: 2015-08-04 | Discharge: 2015-08-04 | Disposition: A | Discharge status: Home / Self Care | Payer: Medicaid Other | Attending: Internal Medicine | Admitting: Internal Medicine

## 2015-08-04 ENCOUNTER — Encounter: Payer: Self-pay | Admitting: Radiology

## 2015-08-04 ENCOUNTER — Inpatient Hospital Stay: Payer: Medicaid Other

## 2015-08-04 LAB — CREATININE, SERUM
CREATININE: 0.59 mg/dL — AB (ref 0.61–1.24)
GFR calc Af Amer: >60 mL/min (ref >60)
GFR calc non Af Amer: >60 mL/min (ref >60)

## 2015-08-04 LAB — BASIC METABOLIC PANEL
Anion gap: 7 (ref 5–15)
BUN: 26 mg/dL — AB (ref 6–20)
CHLORIDE: 95 mmol/L — AB (ref 101–111)
CO2: 26 mmol/L (ref 22–32)
CREATININE: 0.59 mg/dL — AB (ref 0.61–1.24)
Calcium: 8.8 mg/dL — ABNORMAL LOW (ref 8.9–10.3)
GFR calc Af Amer: >60 mL/min (ref >60)
GFR calc non Af Amer: >60 mL/min (ref >60)
Glucose, Bld: 107 mg/dL — ABNORMAL HIGH (ref 65–99)
Potassium: 4.7 mmol/L (ref 3.5–5.1)
SODIUM: 128 mmol/L — AB (ref 135–145)

## 2015-08-04 LAB — TROPONIN I: Troponin I: <0.03 ng/mL (ref <0.031)

## 2015-08-04 MED ORDER — SODIUM CHLORIDE 0.9 % IV SOLN
INTRAVENOUS | Status: AC
  Administered 2015-08-04: 18:00:00 via INTRAVENOUS

## 2015-08-04 MED ORDER — PREDNISONE 20 MG PO TABS
60.0000 mg | ORAL_TABLET | Freq: Every day | ORAL | Status: DC
  Administered 2015-08-05: 60 mg via ORAL
  Filled 2015-08-04: qty 3

## 2015-08-04 MED ORDER — SODIUM CHLORIDE 0.9 % IV BOLUS (SEPSIS)
500.0000 mL | Freq: Once | INTRAVENOUS | Status: AC
  Administered 2015-08-04: 500 mL via INTRAVENOUS

## 2015-08-04 MED ORDER — IOPAMIDOL (ISOVUE-370) INJECTION 76%
75.0000 mL | Freq: Once | INTRAVENOUS | Status: AC | PRN
  Administered 2015-08-04: 75 mL via INTRAVENOUS

## 2015-08-04 NOTE — Progress Notes (Signed)
Patient ID: Jeremiah [name], male   DOB: 12/26/1950, 65 y.o.   MRN: [id number] [facility] PROGRESS NOTE  Miklo Aken UEK:[id number] DOB: 07-09-1950 DOA: 07/28/2015 PCP: No primary care provider on file.  HPI/Subjective: Patient is hypoxemic with pulse ox at 84% on 2 L of oxygen tachycardic , heart rate up to 125 with ambulation, as reported by the nurse patient was very tired after few steps of f ambulation. Reporting left-sided chest pain during my examination   Objective: Filed Vitals:   08/04/15 0901 08/04/15 0917  BP:  138/77  Pulse: 118 98  Temp:    Resp:      Filed Weights   07/28/15 1040  Weight: 107.956 kg (238 lb)    ROS: Review of Systems  Constitutional: Negative for fever and chills.  Eyes: Negative for blurred vision.  Respiratory: Positive for cough and sputum production. Negative for shortness of breath and wheezing.   Cardiovascular: Negative for chest pain.  Gastrointestinal: Negative for nausea, vomiting, abdominal pain, diarrhea and constipation.  Genitourinary: Negative for dysuria.  Musculoskeletal: Negative for joint pain.  Neurological: Negative for dizziness and headaches.   Exam: Physical Exam  Constitutional: He is oriented to person, place, and time.  HENT:  Nose: No mucosal edema.  Mouth/Throat: No oropharyngeal exudate or posterior oropharyngeal edema.  Eyes: Conjunctivae, EOM and lids are normal. Pupils are equal, round, and reactive to light.  Neck: No JVD present. Carotid bruit is not present. No edema present. No thyroid mass and no thyromegaly present.  Cardiovascular: S1 normal and S2 normal.  Exam reveals no gallop.   Murmur heard.  Systolic murmur is present with a grade of 2/6  Pulses:      Dorsalis pedis pulses are 2+ on the right side, and 2+ on the left side.  Respiratory: No respiratory distress. He has no wheezes. He has no rhonchi. He has no rales.  GI: Soft. Bowel sounds are normal. There is no  tenderness.  Musculoskeletal:       Right ankle: He exhibits swelling.       Left ankle: He exhibits swelling.  Lymphadenopathy:    He has no cervical adenopathy.  Neurological: He is alert and oriented to person, place, and time. No cranial nerve deficit.  Skin: Skin is warm. No rash noted. Nails show no clubbing.  Psychiatric: He has a normal mood and affect.      Data Reviewed: Basic Metabolic Panel:  Recent Labs Lab 07/30/15 0529 07/31/15 0347 08/01/15 0404 08/03/15 1707 08/04/15 0533 08/04/15 1108  NA 138 141 138 127*  --  128*  K 4.3 4.5 4.7 5.1  --  4.7  CL 106 107 103 92*  --  95*  CO2 '26 30 31 27  '$ --  26  GLUCOSE 174* 197* 161* 120*  --  107*  BUN 64* 40* 35* 25*  --  26*  CREATININE 0.97 0.84 0.77 0.66 0.59* 0.59*  CALCIUM 8.5* 8.3* 8.6* 8.7*  --  8.8*   LiCBC:  Recent Labs Lab 07/29/15 0552 07/31/15 0347 08/01/15 0404 08/03/15 1707  WBC 15.2*  --  20.0* 16.1*  NEUTROABS  --   --   --  14.5*  HGB 12.9* 12.7* 13.2 13.8  HCT 38.2*  --  38.9* 41.2  MCV 86.9  --  87.9 88.3  PLT 402  --  352 333   Cardiac Enzymes: No results for input(s): CKTOTAL, CKMB, CKMBINDEX, TROPONINI in the last 168 hours.   Recent  Results (from the past 240 hour(s))  Rapid Influenza A&B Antigens ([facility] only)     Status: None   Collection Time: 07/28/15  1:00 PM  Result Value Ref Range Status   Influenza A ([facility]) NEGATIVE NEGATIVE Final   Influenza B ([facility]) NEGATIVE NEGATIVE Final  CULTURE, BLOOD (ROUTINE X 2) w Reflex to PCR ID Panel     Status: None   Collection Time: 07/28/15  3:06 PM  Result Value Ref Range Status   Specimen Description BLOOD LEFT ASSIST CONTROL  Final   Special Requests   Final    BOTTLES DRAWN AEROBIC AND ANAEROBIC 6CC AERO 10CC ANA   Culture NO GROWTH 5 DAYS  Final   Report Status 08/02/2015 FINAL  Final  CULTURE, BLOOD (ROUTINE X 2) w Reflex to PCR ID Panel     Status: None   Collection Time: 07/28/15  3:30 PM  Result Value Ref Range Status    Specimen Description BLOOD LEFT WRIST  Final   Special Requests   Final    BOTTLES DRAWN AEROBIC AND ANAEROBIC [place] AERO 5CC ANA   Culture NO GROWTH 5 DAYS  Final   Report Status 08/02/2015 FINAL  Final    Scheduled Meds: . benzonatate  200 mg Oral TID  . diltiazem  120 mg Oral Daily  . enoxaparin (LOVENOX) injection  40 mg Subcutaneous Q24H  . ipratropium-albuterol  3 mL Nebulization Q4H  . methylPREDNISolone (SOLU-MEDROL) injection  40 mg Intravenous Q12H  . mometasone-formoterol  2 puff Inhalation BID  . nicotine  21 mg Transdermal Daily  . pantoprazole  40 mg Oral Daily  . umeclidinium bromide  1 puff Inhalation Daily    Assessment/Plan:  1. Acute respiratory failure with hypoxia. Patient currently qualifies for oxygen at home.   2. COPD exacerbation. With possible pulmonary fibrosis. Patient is becoming hypotensive and tachycardic with minimal exertion, will increase the oxygen via nasal cannula and 85% pulse ox with abolition might be his new baseline.CT [place] the chest is ordered to rule out pulmonary embolism as patient is  hypoxic, tachypneic and tachycardic with minimal exertion Deconditioning each shift. IV Solu-Medrol taped r to by mouth prednisone ,incentive spirometry.. Continue nebulizer treatments. Levaquin. Appreciate pul recommendations Cardizem CD 120 mg is added to the regimen cxr -with no acute infiltrates ECHO   3. Chest pain- Monitor on telemetry. Rule out acute MI with 3 sets of troponins CT angiogram of the chest to rule out pulmonary embolism  4. Hyponatremia-could be from poor by mouth intake Provided gentle hydration with IV fluids and check BMP in a.m.  5. Acute renal failure. Creatinine normalized, BUN is much better trended down from 64-40--35.--26 FOBT negative   6.  Essential hypertension. Blood pressure acceptable off medications.  7.. Deconditioning with physical therapy -snf  Code Status:     Code Status Orders        Start      Ordered   07/28/15 1458  Full code   Continuous     07/28/15 1457    Code Status History    Date Active Date Inactive Code Status Order ID Comments User Context   This patient has a current code status but no historical code status.     Disposition Plan: Home once breathing better  Antibiotics:  Levaquin  Time spent: 28 minutes  Kalim Kissel  [facility]

## 2015-08-04 NOTE — Progress Notes (Signed)
PT has changed recommendation from "no PT follow up" to "SNF". Clinical Education officer, museum (CSW) attempted to meet with patient to discuss D/C plan. Patient was out of the room for a CT scan. CSW will attempt to meet with patient at a later time.   Blima Rich, LCSW [phone]

## 2015-08-04 NOTE — Clinical Social Work Placement (Signed)
   CLINICAL SOCIAL WORK PLACEMENT  NOTE  Date:  08/04/2015  Patient Details  Name: Jeremiah [name] MRN: [id number] Date of Birth: 09-09-1950  Clinical Social Work is seeking post-discharge placement for this patient at the [place] level of care (*CSW will initial, date and re-position this form in  chart as items are completed):  Yes   Patient/family provided with [place] Work Department's list of facilities offering this level of care within the geographic area requested by the patient (or if unable, by the patient's family).  Yes   Patient/family informed of their freedom to choose among providers that offer the needed level of care, that participate in Medicare, Medicaid or managed care program needed by the patient, have an available bed and are willing to accept the patient.  Yes   Patient/family informed of [facility]'s ownership interest in [facility] and [facility], as well as of the fact that they are under no obligation to receive care at these facilities.  PASRR submitted to EDS on 08/04/15     PASRR number received on 08/04/15     Existing PASRR number confirmed on       FL2 transmitted to all facilities in geographic area requested by pt/family on 08/04/15     FL2 transmitted to all facilities within larger geographic area on       Patient informed that his/her managed care company has contracts with or will negotiate with certain facilities, including the following:        Yes   Patient/family informed of bed offers received.  Patient chooses bed at  ([facility] )     Physician recommends and patient chooses bed at      Patient to be transferred to   on  .  Patient to be transferred to facility by       Patient family notified on   of transfer.  Name of family member notified:        PHYSICIAN       Additional Comment:    _______________________________________________ Loralyn Freshwater, LCSW 08/04/2015,  4:10 PM
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
## 2015-08-04 NOTE — Progress Notes (Signed)
CT requests 20g IV access. Notified IV team to assist due to poor venipuncture status.

## 2015-08-04 NOTE — Clinical Social Work Note (Signed)
Clinical Social Work Assessment  Patient Details  Name: Jeremiah [name] [name] MRN: [id number] Date of Birth: [birth year]  Date of referral:  08/04/15               Reason for consult:  Facility Placement                Permission sought to share information with:  Chartered certified accountant granted to share information::  Yes, Verbal Permission Granted  Name::      Jeremiah [name]   Agency::   [facility]   Relationship::     Contact Information:     Housing/Transportation Living arrangements for the past 2 months:  [place] of Information:  Patient, Friend/Neighbor Patient Interpreter Needed:  None Criminal Activity/Legal Involvement Pertinent to Current Situation/Hospitalization:  No - Comment as needed Significant Relationships:  Friend Lives with:  Friends, Roommate Do you feel safe going back to the place where you live?  Yes Need for family participation in patient care:  Yes (Comment)  Care giving concerns:  Patient lives in [place] with roommates.    Social Worker assessment / plan:  Holiday representative (CSW) reviewed chart and noted that PT is recommending SNF. CSW met with patient alone at bedside. Patient was alert and oriented and laying in the bed. CSW introduced self and explained role of CSW department. Patient reported that he lives in [place] with roommates. Patient reported that he just received his Medicare. CSW explained that PT is recommending SNF for a couple of weeks because patient is deconditioned. Patient is agreeable to SNF search in [facility]. Patient requested CSW speak to his friend Jeremiah [name]. Patient was on the phone with Jeremiah [name] and handed phone to [place]. CSW explained to [place] that PT is recommending SNF. Jeremiah [name] is agreeable for SNF. CSW explained that patient has met with 3 night inpatient stay criteria for Medicare to pay for days 1-20 at 100% at a SNF. Patient verbalized his understanding.   CSW presented bed  offers. Patient chose [name]. Jeremiah [name] is in agreement with [name]. CSW sent [facility] liaison a message making him aware of accepted bed offer.   Employment status:  Disabled (Comment on whether or not currently receiving Disability), Retired Forensic scientist:  Medicare PT Recommendations:  [place] / Referral to community resources:  [place]  Patient/Family's Response to care:  Patient is agreeable to going to [name] for STR.   Patient/Family's Understanding of and Emotional Response to Diagnosis, Current Treatment, and Prognosis:  Patient was pleasant throughout assessment and thanked CSW for visit.   Emotional Assessment Appearance:  Appears stated age Attitude/Demeanor/Rapport:    Affect (typically observed):  Accepting, Adaptable, Pleasant Orientation:  Oriented to Self, Oriented to Place, Oriented to  Time, Oriented to Situation Alcohol / Substance use:  Not Applicable Psych involvement (Current and /or in the community):  No (Comment)  Discharge Needs  Concerns to be addressed:  Discharge Planning Concerns Readmission within the last 30 days:  No Current discharge risk:  Dependent with Mobility Barriers to Discharge:  Continued Medical Work up   Jeremiah Freshwater, LCSW 08/04/2015, 4:11 PM

## 2015-08-04 NOTE — Progress Notes (Signed)
PT notified nursing that pt was experiencing chest pain with activity, SOB with exertion. Pt walked within room and became SOB followed by 7/10 chest pain. MD notified, orders to follow.

## 2015-08-04 NOTE — NC FL2 (Signed)
[place] LEVEL OF CARE SCREENING TOOL     IDENTIFICATION  Patient Name: Jeremiah [name] Birthdate: 1950/11/02 Sex: male Admission Date (Current Location): 07/28/2015  [place] and [place] Number:  Engineering geologist and Address:  [facility], [address] [place], [place] [address]      Provider Number: [id number]  Attending Physician Name and Address:  Nicholes Mango, MD  Relative Name and Phone Number:       Current Level of Care: Hospital Recommended Level of Care: [place] Prior Approval Number:    Date Approved/Denied:   PASRR Number:  [id number])  Discharge Plan: SNF    Current Diagnoses: Patient Active Problem List   Diagnosis Date Noted  . COPD exacerbation ([place]) 07/28/2015    Orientation RESPIRATION BLADDER Height & Weight     Self, Time, Situation, Place  O2 (2 Liters Oxygen ) Continent Weight: 238 lb (107.956 kg) Height:  '6\' 1"'$  (185.4 cm)  BEHAVIORAL SYMPTOMS/MOOD NEUROLOGICAL BOWEL NUTRITION STATUS   (none )  (none ) Continent Diet (Regular Diet )  AMBULATORY STATUS COMMUNICATION OF NEEDS Skin   Extensive Assist Verbally Other (Comment) (Right Wound Heel )                       Personal Care Assistance Level of Assistance  Bathing, Feeding, Dressing Bathing Assistance: Limited assistance Feeding assistance: Independent Dressing Assistance: Limited assistance     Functional Limitations Info  Sight, Hearing, Speech Sight Info: Adequate Hearing Info: Adequate Speech Info: Adequate    SPECIAL CARE FACTORS FREQUENCY  PT (By licensed PT)     PT Frequency:  (5)              Contractures      Additional Factors Info  Code Status, Allergies Code Status Info:  (Full Code. ) Allergies Info:  (Penicillins)           Current Medications (08/04/2015):  This is the current hospital active medication list Current Facility-Administered Medications  Medication Dose Route  Frequency Provider Last Rate Last Dose  . 0.9 %  sodium chloride infusion   Intravenous Continuous Nicholes Mango, MD      . acetaminophen (TYLENOL) tablet 650 mg  650 mg Oral Q6H PRN Gladstone Lighter, MD   650 mg at 08/03/15 1548   Or  . acetaminophen (TYLENOL) suppository 650 mg  650 mg Rectal Q6H PRN Gladstone Lighter, MD      . benzonatate (TESSALON) capsule 200 mg  200 mg Oral TID Loletha Grayer, MD   200 mg at 08/04/15 0948  . diltiazem (CARDIZEM CD) 24 hr capsule 120 mg  120 mg Oral Daily Nicholes Mango, MD   120 mg at 08/04/15 0948  . diphenhydrAMINE (BENADRYL) capsule 25 mg  25 mg Oral Q6H PRN Nicholes Mango, MD   25 mg at 07/31/15 1235  . enoxaparin (LOVENOX) injection 40 mg  40 mg Subcutaneous Q24H Loletha Grayer, MD   40 mg at 08/03/15 1942  . guaiFENesin (ROBITUSSIN) 100 MG/5ML solution 100 mg  5 mL Oral Q4H PRN Gladstone Lighter, MD   100 mg at 08/02/15 2115  . ipratropium-albuterol (DUONEB) 0.5-2.5 (3) MG/3ML nebulizer solution 3 mL  3 mL Nebulization Q4H Gladstone Lighter, MD   3 mL at 08/04/15 1149  . methylPREDNISolone sodium succinate (SOLU-MEDROL) 40 mg/mL injection 40 mg  40 mg Intravenous Q12H Nicholes Mango, MD   40 mg at 08/04/15 0948  . mometasone-formoterol (DULERA) 200-5  MCG/ACT inhaler 2 puff  2 puff Inhalation BID Gladstone Lighter, MD   2 puff at 08/04/15 1135  . nicotine (NICODERM CQ - dosed in mg/24 hours) patch 21 mg  21 mg Transdermal Daily Gladstone Lighter, MD   21 mg at 08/04/15 0951  . ondansetron (ZOFRAN) tablet 4 mg  4 mg Oral Q6H PRN Gladstone Lighter, MD       Or  . ondansetron (ZOFRAN) injection 4 mg  4 mg Intravenous Q6H PRN Gladstone Lighter, MD      . pantoprazole (PROTONIX) EC tablet 40 mg  40 mg Oral Daily Gladstone Lighter, MD   40 mg at 08/04/15 0948  . umeclidinium bromide (INCRUSE ELLIPTA) 62.5 MCG/INH 1 puff  1 puff Inhalation Daily Lenis Noon, RPH   1 puff at 08/04/15 1136  . zolpidem (AMBIEN) tablet 5 mg  5 mg Oral QHS PRN Nicholes Mango, MD          Discharge Medications: Please see discharge summary for a list of discharge medications.  Relevant Imaging Results:  Relevant Lab Results:   Additional Information  (SSN: [id number])  Loralyn Freshwater, LCSW

## 2015-08-05 LAB — ECHOCARDIOGRAM COMPLETE
HEIGHTINCHES: 73 in
WEIGHTICAEL: 3808 [oz_av]

## 2015-08-05 LAB — BASIC METABOLIC PANEL
Anion gap: 6 (ref 5–15)
BUN: 27 mg/dL — AB (ref 6–20)
CALCIUM: 8.6 mg/dL — AB (ref 8.9–10.3)
CO2: 26 mmol/L (ref 22–32)
CREATININE: 0.77 mg/dL (ref 0.61–1.24)
Chloride: 98 mmol/L — ABNORMAL LOW (ref 101–111)
GFR calc non Af Amer: >60 mL/min (ref >60)
Glucose, Bld: 123 mg/dL — ABNORMAL HIGH (ref 65–99)
Potassium: 4.9 mmol/L (ref 3.5–5.1)
Sodium: 130 mmol/L — ABNORMAL LOW (ref 135–145)

## 2015-08-05 MED ORDER — DILTIAZEM HCL 30 MG PO TABS
60.0000 mg | ORAL_TABLET | Freq: Three times a day (TID) | ORAL | Status: DC
  Administered 2015-08-05 – 2015-08-06 (×4): 60 mg via ORAL
  Filled 2015-08-05 (×4): qty 2

## 2015-08-05 MED ORDER — PREDNISONE 20 MG PO TABS: 40.0000 mg | ORAL_TABLET | Freq: Every day | ORAL | Status: DC

## 2015-08-05 MED ORDER — PREDNISONE 5 MG PO TABS: 30.0000 mg | ORAL_TABLET | Freq: Every day | ORAL | Status: DC

## 2015-08-05 MED ORDER — PREDNISONE 50 MG PO TABS
50.0000 mg | ORAL_TABLET | Freq: Every day | ORAL | Status: AC
  Administered 2015-08-06: 50 mg via ORAL
  Filled 2015-08-05: qty 1

## 2015-08-05 MED ORDER — METHOCARBAMOL 1000 MG/10ML IJ SOLN
500.0000 mg | Freq: Once | INTRAMUSCULAR | Status: AC
  Administered 2015-08-05: 500 mg via INTRAVENOUS
  Filled 2015-08-05: qty 5

## 2015-08-05 MED ORDER — GUAIFENESIN ER 600 MG PO TB12
600.0000 mg | ORAL_TABLET | Freq: Two times a day (BID) | ORAL | Status: DC
  Administered 2015-08-05 – 2015-08-06 (×3): 600 mg via ORAL
  Filled 2015-08-05 (×3): qty 1

## 2015-08-05 MED ORDER — PREDNISONE 5 MG PO TABS: 10.0000 mg | ORAL_TABLET | Freq: Every day | ORAL | Status: DC

## 2015-08-05 MED ORDER — PREDNISONE 20 MG PO TABS: 20.0000 mg | ORAL_TABLET | Freq: Every day | ORAL | Status: DC

## 2015-08-05 MED ORDER — VALPROATE SODIUM 500 MG/5ML IV SOLN
500.0000 mg | Freq: Once | INTRAVENOUS | Status: AC
  Administered 2015-08-05: 500 mg via INTRAVENOUS
  Filled 2015-08-05: qty 5

## 2015-08-05 MED ORDER — PROCHLORPERAZINE EDISYLATE 5 MG/ML IJ SOLN
10.0000 mg | Freq: Once | INTRAMUSCULAR | Status: AC
  Administered 2015-08-05: 10 mg via INTRAVENOUS
  Filled 2015-08-05: qty 2

## 2015-08-05 NOTE — Progress Notes (Signed)
Pt continues to have a headache after receiving tylenol.  He said he drinks a "8 cup pot of coffee" every day.  Dr Posey Pronto notified and will put in orders

## 2015-08-05 NOTE — Care Management Important Message (Signed)
Important Message  Patient Details  Name: Jeremiah [name] MRN: [id number] Date of Birth: [birth year]   Medicare Important Message Given:  Yes    Marshell Garfinkel, RN 08/05/2015, 11:29 AM

## 2015-08-05 NOTE — Care Management (Signed)
O2 tank has been picked up by [facility] since patient is now going to SNF at discharge. SNF can arrange home O2 if patient still requires it. No further RNCM needs.

## 2015-08-05 NOTE — Progress Notes (Signed)
Per MD patient will likely be stable for D/C tomorrow 08/06/15. Plan is for [facility] to D[facility] to [facility]. [name] liaison is aware of above. Clinical Social Worker (CSW) will continue to follow and assist as needed.   Blima Rich, LCSW [phone]

## 2015-08-05 NOTE — Progress Notes (Signed)
Patient ID: Jeremiah [name], male   DOB: June 09, 1950, 65 y.o.   MRN: [id number] [facility] PROGRESS NOTE  Jeremiah [name] SNK:[id number] DOB: [birth year] DOA: 07/28/2015 PCP: No primary care provider on file.  HPI/Subjective: Patient complains of headache. Heart rate go up's with standing and walking     Objective: Filed Vitals:   08/05/15 0336 08/05/15 0802  BP: 142/71 120/71  Pulse: 93 80  Temp: 97.7 F (36.5 C) 98.1 F (36.7 C)  Resp: 30 18    Filed Weights   07/28/15 1040  Weight: 107.956 kg (238 lb)    ROS: Review of Systems  Constitutional: Negative for fever and chills.  Eyes: Negative for blurred vision.  Respiratory: Positive for cough and sputum production. Negative for shortness of breath and wheezing.   Cardiovascular: Negative for chest pain.  Gastrointestinal: Negative for nausea, vomiting, abdominal pain, diarrhea and constipation.  Genitourinary: Negative for dysuria.  Musculoskeletal: Negative for joint pain.  Neurological: Negative for dizziness and headaches.   Exam: Physical Exam  Constitutional: He is oriented to person, place, and time.  HENT:  Nose: No mucosal edema.  Mouth/Throat: No oropharyngeal exudate or posterior oropharyngeal edema.  Eyes: Conjunctivae, EOM and lids are normal. Pupils are equal, round, and reactive to light.  Neck: No JVD present. Carotid bruit is not present. No edema present. No thyroid mass and no thyromegaly present.  Cardiovascular: S1 normal and S2 normal.  Exam reveals no gallop.   Murmur heard.  Systolic murmur is present with a grade of 2/6  Pulses:      Dorsalis pedis pulses are 2+ on the right side, and 2+ on the left side.  Respiratory: Diminished breath sounds bilaterally without any rales rhonchi GI: Soft. Bowel sounds are normal. There is no tenderness.  Musculoskeletal:       Right ankle: He exhibits swelling.       Left ankle: He exhibits swelling.  Lymphadenopathy:    He has no  cervical adenopathy.  Neurological: He is alert and oriented to person, place, and time. No cranial nerve deficit.  Skin: Skin is warm. No rash noted. Nails show no clubbing.  Psychiatric: He has a normal mood and affect.      Data Reviewed: Basic Metabolic Panel:  Recent Labs Lab 07/31/15 0347 08/01/15 0404 08/03/15 1707 08/04/15 0533 08/04/15 1108 08/05/15 0616  NA 141 138 127*  --  128* 130*  K 4.5 4.7 5.1  --  4.7 4.9  CL 107 103 92*  --  95* 98*  CO2 '30 31 27  '$ --  26 26  GLUCOSE 197* 161* 120*  --  107* 123*  BUN 40* 35* 25*  --  26* 27*  CREATININE 0.84 0.77 0.66 0.59* 0.59* 0.77  CALCIUM 8.3* 8.6* 8.7*  --  8.8* 8.6*   LiCBC:  Recent Labs Lab 07/31/15 0347 08/01/15 0404 08/03/15 1707  WBC  --  20.0* 16.1*  NEUTROABS  --   --  14.5*  HGB 12.7* 13.2 13.8  HCT  --  38.9* 41.2  MCV  --  87.9 88.3  PLT  --  352 333   Cardiac Enzymes:  Recent Labs Lab 08/04/15 1108 08/04/15 2034  TROPONINI <0.03 <0.03     Recent Results (from the past 240 hour(s))  Rapid Influenza A&B Antigens ([facility] only)     Status: None   Collection Time: 07/28/15  1:00 PM  Result Value Ref Range Status   Influenza A ([place]) NEGATIVE NEGATIVE Final  Influenza B ([facility]) NEGATIVE NEGATIVE Final  CULTURE, BLOOD (ROUTINE X 2) w Reflex to PCR ID Panel     Status: None   Collection Time: 07/28/15  3:06 PM  Result Value Ref Range Status   Specimen Description BLOOD LEFT ASSIST CONTROL  Final   Special Requests   Final    BOTTLES DRAWN AEROBIC AND ANAEROBIC 6CC AERO 10CC ANA   Culture NO GROWTH 5 DAYS  Final   Report Status 08/02/2015 FINAL  Final  CULTURE, BLOOD (ROUTINE X 2) w Reflex to PCR ID Panel     Status: None   Collection Time: 07/28/15  3:30 PM  Result Value Ref Range Status   Specimen Description BLOOD LEFT WRIST  Final   Special Requests   Final    BOTTLES DRAWN AEROBIC AND ANAEROBIC [place] AERO 5CC ANA   Culture NO GROWTH 5 DAYS  Final   Report Status 08/02/2015 FINAL   Final    Scheduled Meds: . benzonatate  200 mg Oral TID  . diltiazem  60 mg Oral 3 times per day  . enoxaparin (LOVENOX) injection  40 mg Subcutaneous Q24H  . guaiFENesin  600 mg Oral BID  . ipratropium-albuterol  3 mL Nebulization Q4H  . methocarbamol (ROBAXIN)  IV  500 mg Intravenous Once  . mometasone-formoterol  2 puff Inhalation BID  . nicotine  21 mg Transdermal Daily  . pantoprazole  40 mg Oral Daily  . [START ON 08/06/2015] predniSONE  50 mg Oral Q breakfast   Followed by  . [START ON 08/07/2015] predniSONE  40 mg Oral Q breakfast   Followed by  . [START ON 08/08/2015] predniSONE  30 mg Oral Q breakfast   Followed by  . [START ON 08/09/2015] predniSONE  20 mg Oral Q breakfast   Followed by  . [START ON 08/10/2015] predniSONE  10 mg Oral Q breakfast  . prochlorperazine  10 mg Intravenous Once  . umeclidinium bromide  1 puff Inhalation Daily  . valproate sodium  500 mg Intravenous Once    Assessment/Plan:  1. Acute respiratory failure with hypoxia. Start prednisone taper continue nebs  2. COPD exacerbation. With possible pulmonary fibrosis. Continue supportive care  3. Chest pain- CT of the chest negative for PE Heart rate intermittently elevated I will change a moderate to short acting Cardizem monitor on telemetry  4. Hyponatremia-could be from poor by mouth intake Could be SIADH due to lung disease continue to monitor  5. Acute renal failure. Resolved   6.  Essential hypertension. Blood pressure stable  7.. Deconditioning with physical therapy -snf  Code Status:     Code Status Orders        Start     Ordered   07/28/15 1458  Full code   Continuous     07/28/15 1457    Code Status History    Date Active Date Inactive Code Status Order ID Comments User Context   This patient has a current code status but no historical code status.     Disposition Plan: snf tomm  Antibiotics:  Levaquin  Time spent: 25 minutes  Jesseca Marsch, [place]  Hospitalists            Patient ID: Jeremiah [name], male   DOB: [birth year], 65 y.o.   MRN: [id number] [facility] PROGRESS NOTE  Jeremiah [name] ZYS:[id number] DOB: Oct 23, 1950 DOA: 07/28/2015 PCP: No primary care provider on file.  HPI/Subjective: Patient is hypoxemic with pulse ox at 84% on  2 L of oxygen tachycardic , heart rate up to 125 with ambulation, as reported by the nurse patient was very tired after few steps of f ambulation. Reporting left-sided chest pain during my examination   Objective: Filed Vitals:   08/05/15 0336 08/05/15 0802  BP: 142/71 120/71  Pulse: 93 80  Temp: 97.7 F (36.5 C) 98.1 F (36.7 C)  Resp: 30 18    Filed Weights   07/28/15 1040  Weight: 107.956 kg (238 lb)    ROS: Review of Systems  Constitutional: Negative for fever and chills.  Eyes: Negative for blurred vision.  Respiratory: Positive for cough and sputum production. Negative for shortness of breath and wheezing.   Cardiovascular: Negative for chest pain.  Gastrointestinal: Negative for nausea, vomiting, abdominal pain, diarrhea and constipation.  Genitourinary: Negative for dysuria.  Musculoskeletal: Negative for joint pain.  Neurological: Negative for dizziness and headaches.   Exam: Physical Exam  Constitutional: He is oriented to person, place, and time.  HENT:  Nose: No mucosal edema.  Mouth/Throat: No oropharyngeal exudate or posterior oropharyngeal edema.  Eyes: Conjunctivae, EOM and lids are normal. Pupils are equal, round, and reactive to light.  Neck: No JVD present. Carotid bruit is not present. No edema present. No thyroid mass and no thyromegaly present.  Cardiovascular: S1 normal and S2 normal.  Exam reveals no gallop.   Murmur heard.  Systolic murmur is present with a grade of 2/6  Pulses:      Dorsalis pedis pulses are 2+ on the right side, and 2+ on the left side.  Respiratory: No respiratory distress. He has no wheezes. He has no  rhonchi. He has no rales.  GI: Soft. Bowel sounds are normal. There is no tenderness.  Musculoskeletal:       Right ankle: He exhibits swelling.       Left ankle: He exhibits swelling.  Lymphadenopathy:    He has no cervical adenopathy.  Neurological: He is alert and oriented to person, place, and time. No cranial nerve deficit.  Skin: Skin is warm. No rash noted. Nails show no clubbing.  Psychiatric: He has a normal mood and affect.      Data Reviewed: Basic Metabolic Panel:  Recent Labs Lab 07/31/15 0347 08/01/15 0404 08/03/15 1707 08/04/15 0533 08/04/15 1108 08/05/15 0616  NA 141 138 127*  --  128* 130*  K 4.5 4.7 5.1  --  4.7 4.9  CL 107 103 92*  --  95* 98*  CO2 '30 31 27  '$ --  26 26  GLUCOSE 197* 161* 120*  --  107* 123*  BUN 40* 35* 25*  --  26* 27*  CREATININE 0.84 0.77 0.66 0.59* 0.59* 0.77  CALCIUM 8.3* 8.6* 8.7*  --  8.8* 8.6*   LiCBC:  Recent Labs Lab 07/31/15 0347 08/01/15 0404 08/03/15 1707  WBC  --  20.0* 16.1*  NEUTROABS  --   --  14.5*  HGB 12.7* 13.2 13.8  HCT  --  38.9* 41.2  MCV  --  87.9 88.3  PLT  --  352 333   Cardiac Enzymes:  Recent Labs Lab 08/04/15 1108 08/04/15 2034  TROPONINI <0.03 <0.03     Recent Results (from the past 240 hour(s))  Rapid Influenza A&B Antigens ([facility] only)     Status: None   Collection Time: 07/28/15  1:00 PM  Result Value Ref Range Status   Influenza A ([facility]) NEGATIVE NEGATIVE Final   Influenza B ([facility]) NEGATIVE NEGATIVE Final  CULTURE,  BLOOD (ROUTINE X 2) w Reflex to PCR ID Panel     Status: None   Collection Time: 07/28/15  3:06 PM  Result Value Ref Range Status   Specimen Description BLOOD LEFT ASSIST CONTROL  Final   Special Requests   Final    BOTTLES DRAWN AEROBIC AND ANAEROBIC 6CC AERO 10CC ANA   Culture NO GROWTH 5 DAYS  Final   Report Status 08/02/2015 FINAL  Final  CULTURE, BLOOD (ROUTINE X 2) w Reflex to PCR ID Panel     Status: None   Collection Time: 07/28/15  3:30 PM  Result Value  Ref Range Status   Specimen Description BLOOD LEFT WRIST  Final   Special Requests   Final    BOTTLES DRAWN AEROBIC AND ANAEROBIC [place] AERO 5CC ANA   Culture NO GROWTH 5 DAYS  Final   Report Status 08/02/2015 FINAL  Final    Scheduled Meds: . benzonatate  200 mg Oral TID  . diltiazem  60 mg Oral 3 times per day  . enoxaparin (LOVENOX) injection  40 mg Subcutaneous Q24H  . guaiFENesin  600 mg Oral BID  . ipratropium-albuterol  3 mL Nebulization Q4H  . methocarbamol (ROBAXIN)  IV  500 mg Intravenous Once  . mometasone-formoterol  2 puff Inhalation BID  . nicotine  21 mg Transdermal Daily  . pantoprazole  40 mg Oral Daily  . [START ON 08/06/2015] predniSONE  50 mg Oral Q breakfast   Followed by  . [START ON 08/07/2015] predniSONE  40 mg Oral Q breakfast   Followed by  . [START ON 08/08/2015] predniSONE  30 mg Oral Q breakfast   Followed by  . [START ON 08/09/2015] predniSONE  20 mg Oral Q breakfast   Followed by  . [START ON 08/10/2015] predniSONE  10 mg Oral Q breakfast  . prochlorperazine  10 mg Intravenous Once  . umeclidinium bromide  1 puff Inhalation Daily  . valproate sodium  500 mg Intravenous Once    Assessment/Plan:  3. Acute respiratory failure with hypoxia. Patient currently qualifies for oxygen at home.   4. COPD exacerbation. With possible pulmonary fibrosis. Patient is becoming hypotensive and tachycardic with minimal exertion, will increase the oxygen via nasal cannula and 85% pulse ox with abolition might be his new baseline.CT Angeo the chest is ordered to rule out pulmonary embolism as patient is  hypoxic, tachypneic and tachycardic with minimal exertion Deconditioning each shift. IV Solu-Medrol taped r to by mouth prednisone ,incentive spirometry.. Continue nebulizer treatments. Levaquin. Appreciate pul recommendations Cardizem CD 120 mg is added to the regimen cxr -with no acute infiltrates ECHO   3. Chest pain- Monitor on telemetry. Rule out acute MI with 3 sets  of troponins CT angiogram of the chest to rule out pulmonary embolism  4. Hyponatremia-could be from poor by mouth intake Provided gentle hydration with IV fluids and check BMP in a.m.  5. Acute renal failure. Creatinine normalized, BUN is much better trended down from 64-40--35.--26 FOBT negative   6.  Essential hypertension. Blood pressure acceptable off medications.  7.. Deconditioning with physical therapy -snf  Code Status:     Code Status Orders        Start     Ordered   07/28/15 1458  Full code   Continuous     07/28/15 1457    Code Status History    Date Active Date Inactive Code Status Order ID Comments User Context   This patient has a current code  status but no historical code status.     Disposition Plan: Home once breathing better  Antibiotics:  Levaquin  Time spent: 28 minutes  Denee Boeder, [facility]  [facility] Hospitalists

## 2015-08-06 MED ORDER — DILTIAZEM HCL 120 MG PO TABS: 120.0000 mg | ORAL_TABLET | Freq: Four times a day (QID) | ORAL | Status: DC

## 2015-08-06 MED ORDER — PREDNISONE 10 MG (21) PO TBPK: 10.0000 mg | ORAL_TABLET | Freq: Every day | ORAL | Status: DC

## 2015-08-06 MED ORDER — METHOCARBAMOL 1000 MG/10ML IJ SOLN
500.0000 mg | Freq: Once | INTRAMUSCULAR | Status: AC
  Administered 2015-08-06: 500 mg via INTRAVENOUS
  Filled 2015-08-06: qty 5

## 2015-08-06 MED ORDER — BENZONATATE 200 MG PO CAPS: 200.0000 mg | ORAL_CAPSULE | Freq: Three times a day (TID) | ORAL | Status: DC

## 2015-08-06 MED ORDER — PROCHLORPERAZINE EDISYLATE 5 MG/ML IJ SOLN
10.0000 mg | Freq: Once | INTRAMUSCULAR | Status: AC
  Administered 2015-08-06: 10 mg via INTRAVENOUS
  Filled 2015-08-06: qty 2

## 2015-08-06 MED ORDER — OXYCODONE-ACETAMINOPHEN 7.5-325 MG PO TABS: 1.0000 | ORAL_TABLET | ORAL | Status: DC | PRN

## 2015-08-06 MED ORDER — IPRATROPIUM-ALBUTEROL 0.5-2.5 (3) MG/3ML IN SOLN: 3.0000 mL | RESPIRATORY_TRACT | Status: DC

## 2015-08-06 MED ORDER — NICOTINE 21 MG/24HR TD PT24: 21.0000 mg | MEDICATED_PATCH | Freq: Every day | TRANSDERMAL | Status: DC

## 2015-08-06 MED ORDER — ACETAMINOPHEN 325 MG PO TABS: 650.0000 mg | ORAL_TABLET | Freq: Four times a day (QID) | ORAL | Status: AC | PRN

## 2015-08-06 MED ORDER — ZOLPIDEM TARTRATE 5 MG PO TABS: 5.0000 mg | ORAL_TABLET | Freq: Every evening | ORAL | Status: DC | PRN

## 2015-08-06 MED ORDER — GUAIFENESIN ER 600 MG PO TB12: 600.0000 mg | ORAL_TABLET | Freq: Two times a day (BID) | ORAL | Status: DC

## 2015-08-06 MED ORDER — GUAIFENESIN 100 MG/5ML PO SOLN: 5.0000 mL | ORAL | Status: DC | PRN

## 2015-08-06 MED ORDER — DIPHENHYDRAMINE HCL 25 MG PO CAPS: 25.0000 mg | ORAL_CAPSULE | Freq: Four times a day (QID) | ORAL | Status: DC | PRN

## 2015-08-06 MED ORDER — VALPROATE SODIUM 500 MG/5ML IV SOLN
500.0000 mg | Freq: Once | INTRAVENOUS | Status: AC
  Administered 2015-08-06: 500 mg via INTRAVENOUS
  Filled 2015-08-06: qty 5

## 2015-08-06 MED ORDER — PANTOPRAZOLE SODIUM 40 MG PO TBEC: 40.0000 mg | DELAYED_RELEASE_TABLET | Freq: Every day | ORAL | Status: DC

## 2015-08-06 NOTE — Plan of Care (Signed)
Problem: Phase II Progression Outcomes Goal: Tolerating diet Outcome: Progressing Appetite good

## 2015-08-06 NOTE — Progress Notes (Signed)
Report called to Yaakov Guthrie, RN at [facility]. Pt stable at present
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
## 2015-08-06 NOTE — Progress Notes (Signed)
Pt continues to report headache. Tylenol given with partial relief. MD notified. New order given for pain medicine. No other concerns or complaints. No shortness of breath.

## 2015-08-06 NOTE — Discharge Summary (Signed)
Jeremiah [name], [place] y.o., DOB [birth year], MRN [id number]. Admission date: 07/28/2015 Discharge Date 08/06/2015 Primary MD No primary care provider on file. Admitting Physician Gladstone Lighter, MD  Admission Diagnosis  Uremia [N19] ARF (acute renal failure) ([place]) [N17.9] Acute respiratory failure with hypoxia ([facility]) [J96.01] Acute renal failure, unspecified acute renal failure type ([place]) [N17.9]  Discharge Diagnosis   Active Problems:   Acute on chronic COPD exacerbation (HCC)   Acute respiratory failure with hypoxia Acute renal failure Hyponatremia Chest pain related to cough  Essential hypertension Generalized deconditioning        Hospital Course Jeremiah [name] is a 65 y.o. male with a known history of COPD not on home oxygen, ongoing smoking, hypertension and GERD who [facility] presents to the hospital secondary to worsening shortness of breath. Patient was seen in this emergency room and was diagnosed with acute on chronic COPD exasperation. Chest x-ray was negative. Patient's was slow to improve. He was seen by physical therapy who recommended he needed the rehabilitation so that is being currently arranged. Patient will need to remain on oxygen continuously and nebulizer therapy.           Consults  pulmonary/intensive care  Significant Tests:  See full reports for all details      Dg Chest 2 View  08/03/2015  CLINICAL DATA:  Shortness of breath EXAM: CHEST  2 VIEW COMPARISON:  July 28, 2015 in Sep 30, 2013 FINDINGS: The lungs are somewhat hyperexpanded but stable. There are scattered areas of scarring with diffuse interstitial prominence, stable. There is no airspace consolidation. There is no appreciable interstitial edema. The heart size is upper normal with pulmonary vascularity within normal limits. No adenopathy. There is mild degenerative change in the thoracic spine. IMPRESSION: Lungs somewhat hyperexpanded. Generalized interstitial  prominence is chronic and probably reflects chronic inflammatory change. There may be a degree of underlying fibrosis. There is no frank edema or consolidation. Cardiac silhouette is stable. Electronically Signed   By: Lowella Grip III M.D.   On: 08/03/2015 14:36   Dg Chest 2 View  07/28/2015  CLINICAL DATA:  Productive cough for 2 weeks. Shortness of breath and chest pain for 1 week. COPD. EXAM: CHEST  2 VIEW COMPARISON:  09/30/2013 FINDINGS: The heart size and mediastinal contours are within normal limits. Pulmonary hyperinflation and diffuse interstitial prominence show no significant change, consistent with COPD. No evidence acute infiltrate or pleural effusion. No evidence of pneumothorax. Mild thoracic spine degenerative changes again noted. IMPRESSION: COPD.  No active disease. Electronically Signed   By: Earle Gell M.D.   On: 07/28/2015 12:38   Ct Angio Chest Pe W/cm &/or Wo Cm  08/04/2015  CLINICAL DATA:  Shortness of breath, left-sided chest pain for 2 weeks EXAM: CT ANGIOGRAPHY CHEST WITH CONTRAST TECHNIQUE: Multidetector CT imaging of the chest was performed using the standard protocol during bolus administration of intravenous contrast. Multiplanar CT image reconstructions and MIPs were obtained to evaluate the vascular anatomy. CONTRAST:  75 cc Isovue 370 IV COMPARISON:  09/30/2013 FINDINGS: Mediastinum/Nodes: Heart is normal size. Densely calcified coronary arteries diffusely. Aorta is normal caliber scattered calcifications in the arch. No mediastinal, hilar, or axillary adenopathy. No filling defects in the pulmonary arteries to suggest pulmonary emboli. Lungs/Pleura: Moderate emphysematous changes within the lungs. Areas of scarring in the lingula and right upper lobe. No confluent airspace opacities or pleural effusions. Upper abdomen: Imaging into the upper abdomen shows no acute findings. Musculoskeletal: Chest wall  soft tissues are unremarkable. No acute bony abnormality. Review of  the MIP images confirms the above findings. IMPRESSION: No evidence of pulmonary embolus. Emphysema. Coronary artery disease. Electronically Signed   By: Rolm Baptise M.D.   On: 08/04/2015 14:26   US Renal  07/28/2015  CLINICAL DATA:  Acute renal failure.  Initial encounter. EXAM: RENAL / URINARY TRACT ULTRASOUND COMPLETE COMPARISON:  None. FINDINGS: Right Kidney: Length: 11.9 cm. Echogenicity is somewhat increased. No mass or hydronephrosis visualized. Left Kidney: Length: 11.9 cm. Echogenicity is mildly increased. No mass or hydronephrosis visualized. Bladder: Appears normal for degree of bladder distention. IMPRESSION: Some increase in cortical echogenicity bilaterally compatible with medical renal disease. Negative for hydronephrosis or acute abnormality. Electronically Signed   By: Inge Rise M.D.   On: 07/28/2015 14:23       Today   Subjective:   Jeremiah [name] feels better shortness of breath improved Blood pressure 125/59, pulse 100, temperature 97.5 F (36.4 C), temperature source Oral, resp. rate 18, height '6\' 1"'$  (1.854 m), weight 107.956 kg (238 lb), SpO2 96 %.  .  Intake/Output Summary (Last 24 hours) at 08/06/15 1238 Last data filed at 08/06/15 0000  Gross per 24 hour  Intake    240 ml  Output   1400 ml  Net  -1160 ml    Exam VITAL SIGNS: Blood pressure 125/59, pulse 100, temperature 97.5 F (36.4 C), temperature source Oral, resp. rate 18, height '6\' 1"'$  (1.854 m), weight 107.956 kg (238 lb), SpO2 96 %.  GENERAL:  65 y.o.-year-old patient lying in the bed with no acute distress.  EYES: Pupils equal, round, reactive to light and accommodation. No scleral icterus. Extraocular muscles intact.  HEENT: Head atraumatic, normocephalic. Oropharynx and nasopharynx clear.  NECK:  Supple, no jugular venous distention. No thyroid enlargement, no tenderness.  LUNGS: Normal breath sounds bilaterally, no wheezing, rales,rhonchi or crepitation. No use of accessory muscles of  respiration.  CARDIOVASCULAR: S1, S2 normal. No murmurs, rubs, or gallops.  ABDOMEN: Soft, nontender, nondistended. Bowel sounds present. No organomegaly or mass.  EXTREMITIES: No pedal edema, cyanosis, or clubbing.  NEUROLOGIC: Cranial nerves II through XII are intact. Muscle strength 5/5 in all extremities. Sensation intact. Gait not checked.  PSYCHIATRIC: The patient is alert and oriented x 3.  SKIN: No obvious rash, lesion, or ulcer.   Data Review     CBC w Diff: Lab Results  Component Value Date   WBC 16.1* 08/03/2015   WBC 6.3 10/01/2013   HGB 13.8 08/03/2015   HGB 13.6 10/01/2013   HCT 41.2 08/03/2015   HCT 40.7 10/01/2013   PLT 333 08/03/2015   PLT 308 10/01/2013   LYMPHOPCT 7 08/03/2015   LYMPHOPCT 3.3 10/01/2013   MONOPCT 3 08/03/2015   MONOPCT 0.5 10/01/2013   EOSPCT 0 08/03/2015   EOSPCT 0.0 10/01/2013   BASOPCT 0 08/03/2015   BASOPCT 0.2 10/01/2013   CMP: Lab Results  Component Value Date   NA 130* 08/05/2015   NA 136 10/02/2013   K 4.9 08/05/2015   K 4.4 10/02/2013   CL 98* 08/05/2015   CL 104 10/02/2013   CO2 26 08/05/2015   CO2 26 10/02/2013   BUN 27* 08/05/2015   BUN 19* 10/02/2013   CREATININE 0.77 08/05/2015   CREATININE 0.83 10/02/2013   PROT 7.0 10/01/2013   ALBUMIN 3.3* 10/01/2013   BILITOT 0.3 10/01/2013   ALKPHOS 109 10/01/2013   AST 18 10/01/2013   ALT 40 10/01/2013  .  Micro  Results Recent Results (from the past 240 hour(s))  Rapid Influenza A&B Antigens ([facility] only)     Status: None   Collection Time: 07/28/15  1:00 PM  Result Value Ref Range Status   Influenza A ([facility]) NEGATIVE NEGATIVE Final   Influenza B ([facility]) NEGATIVE NEGATIVE Final  CULTURE, BLOOD (ROUTINE X 2) w Reflex to PCR ID Panel     Status: None   Collection Time: 07/28/15  3:06 PM  Result Value Ref Range Status   Specimen Description BLOOD LEFT ASSIST CONTROL  Final   Special Requests   Final    BOTTLES DRAWN AEROBIC AND ANAEROBIC 6CC AERO 10CC ANA   Culture NO  GROWTH 5 DAYS  Final   Report Status 08/02/2015 FINAL  Final  CULTURE, BLOOD (ROUTINE X 2) w Reflex to PCR ID Panel     Status: None   Collection Time: 07/28/15  3:30 PM  Result Value Ref Range Status   Specimen Description BLOOD LEFT WRIST  Final   Special Requests   Final    BOTTLES DRAWN AEROBIC AND ANAEROBIC [place] AERO 5CC ANA   Culture NO GROWTH 5 DAYS  Final   Report Status 08/02/2015 FINAL  Final        Code Status Orders        Start     Ordered   07/28/15 1458  Full code   Continuous     07/28/15 1457    Code Status History    Date Active Date Inactive Code Status Order ID Comments User Context   This patient has a current code status but no historical code status.          Follow-up Information    Follow up with Laverle Hobby, MD On 08/19/2015.   Specialty:  Pulmonary Disease   Why:  at 140   Contact information:   [place] [address] [phone]       Follow up with [facility] .   Specialty:  [place] information:   [address] [place] [phone]      Follow up with Merton Border, MD In 7 days.   Specialty:  Pulmonary Disease   Contact information:   [place] [address] [phone]       Discharge Medications     Medication List    TAKE these medications        acetaminophen 325 MG tablet  Commonly known as:  TYLENOL  Take 2 tablets (650 mg total) by mouth every 6 (six) hours as needed for mild pain (or Fever >/= 101).     albuterol 108 (90 Base) MCG/ACT inhaler  Commonly known as:  PROVENTIL HFA;VENTOLIN HFA  Inhale 1-2 puffs into the lungs every 4 (four) hours as needed for wheezing or shortness of breath.     benzonatate 200 MG capsule  Commonly known as:  TESSALON  Take 1 capsule (200 mg total) by mouth 3 (three) times daily.     diltiazem 120 MG tablet  Commonly known as:  CARDIZEM   Take 1 tablet (120 mg total) by mouth 4 (four) times daily.     diphenhydrAMINE 25 mg capsule  Commonly known as:  BENADRYL  Take 1 capsule (25 mg total) by mouth every 6 (six) hours as needed for itching, allergies or sleep.     Fluticasone-Salmeterol 500-50 MCG/DOSE Aepb  Commonly known as:  ADVAIR  Inhale 1 puff into the lungs 2 (two) times daily.     guaiFENesin 600 MG 12 hr tablet  Commonly known as:  MUCINEX  Take 1 tablet (600 mg total) by mouth 2 (two) times daily.     guaiFENesin 100 MG/5ML Soln  Commonly known as:  ROBITUSSIN  Take 5 mLs (100 mg total) by mouth every 4 (four) hours as needed for cough or to loosen phlegm.     hydrochlorothiazide 25 MG tablet  Commonly known as:  HYDRODIURIL  Take 25 mg by mouth daily.     hydrOXYzine 25 MG tablet  Commonly known as:  ATARAX/VISTARIL  Take 25 mg by mouth every 8 (eight) hours as needed for anxiety.     INCRUSE ELLIPTA 62.5 MCG/INH Aepb  Generic drug:  umeclidinium bromide  Inhale 1 puff into the lungs daily.     ipratropium-albuterol 0.5-2.5 (3) MG/3ML Soln  Commonly known as:  DUONEB  Take 3 mLs by nebulization every 4 (four) hours.     lisinopril 10 MG tablet  Commonly known as:  PRINIVIL,ZESTRIL  Take 10 mg by mouth daily.     nicotine 21 mg/24hr patch  Commonly known as:  NICODERM CQ - dosed in mg/24 hours  Place 1 patch (21 mg total) onto the skin daily.     omeprazole 20 MG capsule  Commonly known as:  PRILOSEC  Take 20 mg by mouth daily.     oxyCODONE-acetaminophen 7.5-325 MG tablet  Commonly known as:  PERCOCET  Take 1 tablet by mouth every 4 (four) hours as needed for severe pain.     pantoprazole 40 MG tablet  Commonly known as:  PROTONIX  Take 1 tablet (40 mg total) by mouth daily.     predniSONE 10 MG (21) Tbpk tablet  Commonly known as:  STERAPRED UNI-PAK 21 TAB  Take 1 tablet (10 mg total) by mouth daily. Start at '60mg'$  taper by '10mg'$  daily until complete     zolpidem 5 MG tablet   Commonly known as:  AMBIEN  Take 1 tablet (5 mg total) by mouth at bedtime as needed for sleep.           Total Time in preparing paper work, data evaluation and todays exam - 35 minutes  Dustin Flock M.D on 08/06/2015 at 12:38 PM  [facility]   Office  [phone]

## 2015-08-06 NOTE — Discharge Instructions (Signed)
°  DIET:  °Cardiac diet ° °DISCHARGE CONDITION:  °Stable ° °ACTIVITY:  °Activity as tolerated ° °OXYGEN:  °Home Oxygen: Yes.   °  °Oxygen Delivery: 2 liters/min via Patient connected to nasal cannula oxygen ° °DISCHARGE LOCATION:  °home  ° ° °ADDITIONAL DISCHARGE INSTRUCTION: ° ° °If you experience worsening of your admission symptoms, develop shortness of breath, life threatening emergency, suicidal or homicidal thoughts you must seek medical attention immediately by calling 911 or calling your MD immediately  if symptoms less severe. ° °You Must read complete instructions/literature along with all the possible adverse reactions/side effects for all the Medicines you take and that have been prescribed to you. Take any new Medicines after you have completely understood and accpet all the possible adverse reactions/side effects.  ° °Please note ° °You were cared for by a hospitalist during your hospital stay. If you have any questions about your discharge medications or the care you received while you were in the hospital after you are discharged, you can call the unit and asked to speak with the hospitalist on call if the hospitalist that took care of you is not available. Once you are discharged, your primary care physician will handle any further medical issues. Please note that NO REFILLS for any discharge medications will be authorized once you are discharged, as it is imperative that you return to your primary care physician (or establish a relationship with a primary care physician if you do not have one) for your aftercare needs so that they can reassess your need for medications and monitor your lab values. ° ° °

## 2015-08-06 NOTE — Clinical Social Work Placement (Addendum)
   CLINICAL SOCIAL WORK PLACEMENT  NOTE  Date:  08/06/2015  Patient Details  Name: Jeremiah [name] MRN: [id number] Date of Birth: [birth year]  Clinical Social Work is seeking post-discharge placement for this patient at the [facility] [place] level of care (*CSW will initial, date and re-position this form in  chart as items are completed):  Yes   Patient/family provided with [facility] Work Department's list of facilities offering this level of care within the geographic area requested by the patient (or if unable, by the patient's family).  Yes   Patient/family informed of their freedom to choose among providers that offer the needed level of care, that participate in Medicare, Medicaid or managed care program needed by the patient, have an available bed and are willing to accept the patient.  Yes   Patient/family informed of [facility]'s ownership interest in [facility] and [facility], as well as of the fact that they are under no obligation to receive care at these facilities.  PASRR submitted to EDS on 08/04/15     PASRR number received on 08/04/15     Existing PASRR number confirmed on       FL2 transmitted to all facilities in geographic area requested by pt/family on 08/04/15     FL2 transmitted to all facilities within larger geographic area on       Patient informed that his/her managed care company has contracts with or will negotiate with certain facilities, including the following:        Yes   Patient/family informed of bed offers received.  Patient chooses bed at  ([facility] )     Physician recommends and patient chooses bed at  [facility])    Patient to be transferred to [facility] [place] on 08/06/15.  Patient to be transferred to facility by [facility]     Patient family notified on 08/06/15 of transfer.  Name of family member notified:  [name], friend--unable to be reached  PHYSICIAN       Additional Comment:     _______________________________________________ Darden Dates, LCSW 08/06/2015, 4:12 PM

## 2015-08-06 NOTE — Progress Notes (Signed)
Patient left unit at 1640 via EMS. Alert and oriented x 4. Ambulated to stretcher.

## 2015-08-06 NOTE — Clinical Social Work Note (Signed)
Pt is ready for discharge today to [facility]. Facility is ready to accept as they have received discharge information. Pt is in agreement with discharge plan. CSW was unable to get a a hold of his friend, [name], however pt is alert and oriented. RN called report and EMS will provide transportation. CSW is signing off as no further needs identified.   Darden Dates, MSW, LCSW  Clinical Social Worker  [phone]

## 2015-08-19 ENCOUNTER — Encounter (INDEPENDENT_AMBULATORY_CARE_PROVIDER_SITE_OTHER): Payer: Self-pay

## 2015-08-19 ENCOUNTER — Ambulatory Visit (INDEPENDENT_AMBULATORY_CARE_PROVIDER_SITE_OTHER): Payer: Medicare Other | Admitting: Internal Medicine

## 2015-08-19 ENCOUNTER — Encounter: Payer: Self-pay | Admitting: Internal Medicine

## 2015-08-19 VITALS — BP 128/62 | HR 91 | Ht 73.0 in | Wt 246.6 lb

## 2015-08-19 DIAGNOSIS — J441 Chronic obstructive pulmonary disease with (acute) exacerbation: Secondary | ICD-10-CM | POA: Diagnosis not present

## 2015-08-19 NOTE — Progress Notes (Signed)
* [facility]     Assessment and Plan:   Chronic respiratory failure, severe emphysema. -Continue the patient's usual outpatient regimen upon discharge, which includes Advair,spiriva DuoNeb's q 4 prn. . -He will require pulmonary function testing, and would benefit from outpatient referral to rehabilitation.  Tobacco abuse. -He has stopped smoking and encouraged to not start when he gets home from [facility].   Chronic debility, deconditioning. -Would benefit from pulmonary rehabilitation.   Date: 08/19/2015  MRN# [id number] [place] 1951/01/31   Waynette Buttery Daily is a 65 y.o. old male seen in follow up for chief complaint of  Chief Complaint  Patient presents with  . Hospitalization Follow-up    pt states breathing has improved since hosp. c/o SOB, non prod cough, wheezing, ankles swelling.     HPI:   The patient is a 65 year old male smoker, he was seen in the hospital recently last month for COPD exacerbation. At that time it was noted that he had severe emphysematous changes on his CT of the chest. He had significant deconditioning and weakness, with dyspnea upon walking short distances or performing any light activities. He was asked to follow-up outpatient, at which time we would consider outpatient pulmonary function testing, as well as referral to pulmonary rehabilitation.  Since getting our of the hospital his breathing has been getting better. He has still had a lot of LE edema. He is currently staying at [facility] and is undergoing physical therapy there. He is able to walk about 1000 feet to where he does his PT and feels that his PT has been going well.  He is no longer smoking. He is using advair twice per day and spiriva once daily. He is using he nebulizer 4 times daily.   I reviewed the patient's CT chest and chest x-ray images and reports from recent admission. These are consistent with very severe emphysematous changes in the  lungs.  Medication:   Outpatient Encounter Prescriptions as of 08/19/2015  Medication Sig  . acetaminophen (TYLENOL) 325 MG tablet Take 2 tablets (650 mg total) by mouth every 6 (six) hours as needed for mild pain (or Fever >/= 101).  Marland Kitchen albuterol (PROVENTIL HFA;VENTOLIN HFA) 108 (90 Base) MCG/ACT inhaler Inhale 1-2 puffs into the lungs every 4 (four) hours as needed for wheezing or shortness of breath.   . benzonatate (TESSALON) 200 MG capsule Take 1 capsule (200 mg total) by mouth 3 (three) times daily.  Marland Kitchen diltiazem (CARDIZEM) 120 MG tablet Take 1 tablet (120 mg total) by mouth 4 (four) times daily.  . diphenhydrAMINE (BENADRYL) 25 mg capsule Take 1 capsule (25 mg total) by mouth every 6 (six) hours as needed for itching, allergies or sleep.  Marland Kitchen Fluticasone-Salmeterol (ADVAIR) 500-50 MCG/DOSE AEPB Inhale 1 puff into the lungs 2 (two) times daily.  Marland Kitchen guaiFENesin (MUCINEX) 600 MG 12 hr tablet Take 1 tablet (600 mg total) by mouth 2 (two) times daily.  Marland Kitchen guaiFENesin (ROBITUSSIN) 100 MG/5ML SOLN Take 5 mLs (100 mg total) by mouth every 4 (four) hours as needed for cough or to loosen phlegm.  . hydrochlorothiazide (HYDRODIURIL) 25 MG tablet Take 25 mg by mouth daily.  . hydrOXYzine (ATARAX/VISTARIL) 25 MG tablet Take 25 mg by mouth every 8 (eight) hours as needed for anxiety.  Marland Kitchen ipratropium-albuterol (DUONEB) 0.5-2.5 (3) MG/3ML SOLN Take 3 mLs by nebulization every 4 (four) hours.  Marland Kitchen lisinopril (PRINIVIL,ZESTRIL) 10 MG tablet Take 10 mg by mouth daily.  . nicotine (NICODERM CQ -  DOSED IN MG/24 HOURS) 21 mg/24hr patch Place 1 patch (21 mg total) onto the skin daily.  Marland Kitchen omeprazole (PRILOSEC) 20 MG capsule Take 20 mg by mouth daily.  Marland Kitchen oxyCODONE-acetaminophen (PERCOCET) 7.5-325 MG tablet Take 1 tablet by mouth every 4 (four) hours as needed for severe pain.  . pantoprazole (PROTONIX) 40 MG tablet Take 1 tablet (40 mg total) by mouth daily.  . predniSONE (STERAPRED UNI-PAK 21 TAB) 10 MG (21) TBPK tablet  Take 1 tablet (10 mg total) by mouth daily. Start at '60mg'$  taper by '10mg'$  daily until complete  . umeclidinium bromide (INCRUSE ELLIPTA) 62.5 MCG/INH AEPB Inhale 1 puff into the lungs daily.  Marland Kitchen zolpidem (AMBIEN) 5 MG tablet Take 1 tablet (5 mg total) by mouth at bedtime as needed for sleep.   No facility-administered encounter medications on file as of 08/19/2015.     Allergies:  Penicillins  Review of Systems: Gen:  Denies  fever, sweats. HEENT: Denies blurred vision. Cvc:  No dizziness, chest pain or heaviness Resp:   Denies cough or sputum Gi: Denies swallowing difficulty, stomach pain. constipation, bowel incontinence Gu:  Denies bladder incontinence, burning urine Ext:   No Joint pain, stiffness. Skin: No skin rash, easy bruising. Endoc:  No polyuria, polydipsia. Psych: No depression, insomnia. Other:  All other systems were reviewed and found to be negative other than what is mentioned in the HPI.   Physical Examination:   VS: BP 128/62 mmHg  Pulse 91  Ht '6\' 1"'$  (1.854 m)  Wt 246 lb 9.6 oz (111.857 kg)  BMI 32.54 kg/m2  SpO2 97%  General Appearance: No distress  Neuro:without focal findings,  speech normal,  HEENT: PERRLA, EOM intact. Pulmonary: normal breath sounds, No wheezing.   CardiovascularNormal S1,S2.  No m/r/g.   Abdomen: Benign, Soft, non-tender. Renal:  No costovertebral tenderness  GU:  Not performed at this time. Endoc: No evident thyromegaly, no signs of acromegaly. Skin:   warm, no rash. Extremities: 3+ LE edema and erythema. NOT tender.    LABORATORY PANEL:   CBC No results for input(s): WBC, HGB, HCT, PLT in the last 168 hours. ------------------------------------------------------------------------------------------------------------------  Chemistries  No results for input(s): NA, K, CL, CO2, GLUCOSE, BUN, CREATININE, CALCIUM, MG, AST, ALT, ALKPHOS, BILITOT in the last 168 hours.  Invalid input(s):  GFRCGP ------------------------------------------------------------------------------------------------------------------  Cardiac Enzymes No results for input(s): TROPONINI in the last 168 hours. ------------------------------------------------------------  RADIOLOGY:   No results found for this or any previous visit. Results for orders placed during the hospital encounter of 07/28/15  DG Chest 2 View   Narrative CLINICAL DATA:  Shortness of breath  EXAM: CHEST  2 VIEW  COMPARISON:  July 28, 2015 in Sep 30, 2013  FINDINGS: The lungs are somewhat hyperexpanded but stable. There are scattered areas of scarring with diffuse interstitial prominence, stable. There is no airspace consolidation. There is no appreciable interstitial edema. The heart size is upper normal with pulmonary vascularity within normal limits. No adenopathy. There is mild degenerative change in the thoracic spine.  IMPRESSION: Lungs somewhat hyperexpanded. Generalized interstitial prominence is chronic and probably reflects chronic inflammatory change. There may be a degree of underlying fibrosis. There is no frank edema or consolidation. Cardiac silhouette is stable.   Electronically Signed   By: Lowella Grip III M.D.   On: 08/03/2015 14:36    ------------------------------------------------------------------------------------------------------------------  Thank  you for allowing [facility] to assist in the care of your patient. Our recommendations are noted above.  Please contact us if we can be of further service.   Marda Stalker, MD.  [facility] Office Number: [phone]  Patricia Pesa, M.D.  Vilinda Boehringer, M.D.  Merton Border, M.D  08/19/2015

## 2015-08-19 NOTE — Patient Instructions (Addendum)
Full PFT in 4 months and follow up at that time.   Continue using your inhaler.   Stay away from smoking.   Continue using your oxygen.

## 2015-09-09 ENCOUNTER — Encounter: Payer: Medicare Other | Admitting: Pharmacist

## 2015-10-20 ENCOUNTER — Other Ambulatory Visit: Payer: Self-pay

## 2015-10-27 ENCOUNTER — Ambulatory Visit: Payer: Self-pay | Admitting: Internal Medicine

## 2015-11-23 ENCOUNTER — Other Ambulatory Visit: Payer: Self-pay

## 2015-11-23 MED ORDER — UMECLIDINIUM BROMIDE 62.5 MCG/INH IN AEPB: 1.0000 | INHALATION_SPRAY | Freq: Every day | RESPIRATORY_TRACT | Status: DC

## 2015-12-31 ENCOUNTER — Ambulatory Visit: Payer: Medicare Other | Admitting: Internal Medicine

## 2016-04-24 ENCOUNTER — Other Ambulatory Visit: Payer: Self-pay | Admitting: *Deleted

## 2016-04-24 DIAGNOSIS — J449 Chronic obstructive pulmonary disease, unspecified: Secondary | ICD-10-CM

## 2016-05-11 ENCOUNTER — Ambulatory Visit: Payer: Medicare Other | Attending: Internal Medicine

## 2016-08-22 DIAGNOSIS — J438 Other emphysema: Secondary | ICD-10-CM

## 2016-08-22 HISTORY — DX: Other emphysema: J43.8

## 2016-10-15 ENCOUNTER — Inpatient Hospital Stay
Admission: EM | Admit: 2016-10-15 | Discharge: 2016-10-20 | DRG: 190 | Disposition: A | Discharge status: Home / Self Care | Payer: Medicare Other | Attending: Internal Medicine | Admitting: Internal Medicine

## 2016-10-15 ENCOUNTER — Encounter: Payer: Self-pay | Admitting: *Deleted

## 2016-10-15 ENCOUNTER — Emergency Department: Payer: Medicare Other

## 2016-10-15 DIAGNOSIS — Z9981 Dependence on supplemental oxygen: Secondary | ICD-10-CM | POA: Diagnosis not present

## 2016-10-15 DIAGNOSIS — I10 Essential (primary) hypertension: Secondary | ICD-10-CM | POA: Diagnosis present

## 2016-10-15 DIAGNOSIS — F17210 Nicotine dependence, cigarettes, uncomplicated: Secondary | ICD-10-CM | POA: Diagnosis present

## 2016-10-15 DIAGNOSIS — K219 Gastro-esophageal reflux disease without esophagitis: Secondary | ICD-10-CM | POA: Diagnosis present

## 2016-10-15 DIAGNOSIS — J441 Chronic obstructive pulmonary disease with (acute) exacerbation: Principal | ICD-10-CM | POA: Diagnosis present

## 2016-10-15 DIAGNOSIS — Z880 Allergy status to penicillin: Secondary | ICD-10-CM | POA: Diagnosis not present

## 2016-10-15 DIAGNOSIS — J9621 Acute and chronic respiratory failure with hypoxia: Secondary | ICD-10-CM

## 2016-10-15 DIAGNOSIS — J209 Acute bronchitis, unspecified: Secondary | ICD-10-CM

## 2016-10-15 DIAGNOSIS — J9601 Acute respiratory failure with hypoxia: Secondary | ICD-10-CM | POA: Diagnosis not present

## 2016-10-15 DIAGNOSIS — J9600 Acute respiratory failure, unspecified whether with hypoxia or hypercapnia: Secondary | ICD-10-CM

## 2016-10-15 DIAGNOSIS — E871 Hypo-osmolality and hyponatremia: Secondary | ICD-10-CM | POA: Diagnosis present

## 2016-10-15 DIAGNOSIS — J440 Chronic obstructive pulmonary disease with acute lower respiratory infection: Secondary | ICD-10-CM | POA: Diagnosis present

## 2016-10-15 DIAGNOSIS — Z79899 Other long term (current) drug therapy: Secondary | ICD-10-CM

## 2016-10-15 DIAGNOSIS — F17200 Nicotine dependence, unspecified, uncomplicated: Secondary | ICD-10-CM

## 2016-10-15 DIAGNOSIS — R0902 Hypoxemia: Secondary | ICD-10-CM

## 2016-10-15 LAB — CBC
HCT: 40.5 % (ref 40.0–52.0)
HEMOGLOBIN: 13.8 g/dL (ref 13.0–18.0)
MCH: 29.3 pg (ref 26.0–34.0)
MCHC: 33.9 g/dL (ref 32.0–36.0)
MCV: 86.5 fL (ref 80.0–100.0)
PLATELETS: 312 10*3/uL (ref 150–440)
RBC: 4.69 MIL/uL (ref 4.40–5.90)
RDW: 14.3 % (ref 11.5–14.5)
WBC: 9.7 10*3/uL (ref 3.8–10.6)

## 2016-10-15 LAB — BASIC METABOLIC PANEL
ANION GAP: 9 (ref 5–15)
BUN: 14 mg/dL (ref 6–20)
CALCIUM: 9 mg/dL (ref 8.9–10.3)
CO2: 25 mmol/L (ref 22–32)
CREATININE: 1.02 mg/dL (ref 0.61–1.24)
Chloride: 94 mmol/L — ABNORMAL LOW (ref 101–111)
GFR calc non Af Amer: >60 mL/min (ref >60)
Glucose, Bld: 128 mg/dL — ABNORMAL HIGH (ref 65–99)
Potassium: 4.3 mmol/L (ref 3.5–5.1)
SODIUM: 128 mmol/L — AB (ref 135–145)

## 2016-10-15 LAB — TROPONIN I

## 2016-10-15 LAB — MRSA PCR SCREENING: MRSA by PCR: NEGATIVE

## 2016-10-15 LAB — GLUCOSE, CAPILLARY: Glucose-Capillary: 116 mg/dL — ABNORMAL HIGH (ref 65–99)

## 2016-10-15 MED ORDER — NICOTINE 21 MG/24HR TD PT24
21.0000 mg | MEDICATED_PATCH | Freq: Every day | TRANSDERMAL | Status: DC
  Administered 2016-10-15 – 2016-10-20 (×6): 21 mg via TRANSDERMAL
  Filled 2016-10-15 (×6): qty 1

## 2016-10-15 MED ORDER — ACETAMINOPHEN 325 MG PO TABS
650.0000 mg | ORAL_TABLET | Freq: Four times a day (QID) | ORAL | Status: DC | PRN
  Administered 2016-10-16 – 2016-10-17 (×2): 650 mg via ORAL
  Filled 2016-10-15 (×2): qty 2

## 2016-10-15 MED ORDER — ACETAMINOPHEN 650 MG RE SUPP: 650.0000 mg | Freq: Four times a day (QID) | RECTAL | Status: DC | PRN

## 2016-10-15 MED ORDER — CHLORHEXIDINE GLUCONATE 0.12 % MT SOLN
15.0000 mL | Freq: Two times a day (BID) | OROMUCOSAL | Status: DC
  Administered 2016-10-16 – 2016-10-20 (×8): 15 mL via OROMUCOSAL
  Filled 2016-10-15 (×8): qty 15

## 2016-10-15 MED ORDER — METHYLPREDNISOLONE SODIUM SUCC 40 MG IJ SOLR: 40.0000 mg | Freq: Two times a day (BID) | INTRAMUSCULAR | Status: DC

## 2016-10-15 MED ORDER — MAGNESIUM SULFATE 2 GM/50ML IV SOLN
2.0000 g | Freq: Once | INTRAVENOUS | Status: AC
  Administered 2016-10-15: 2 g via INTRAVENOUS
  Filled 2016-10-15: qty 50

## 2016-10-15 MED ORDER — LORAZEPAM 2 MG/ML IJ SOLN
0.5000 mg | INTRAMUSCULAR | Status: DC | PRN
  Administered 2016-10-16 – 2016-10-20 (×5): 0.5 mg via INTRAVENOUS
  Filled 2016-10-15 (×6): qty 1

## 2016-10-15 MED ORDER — IPRATROPIUM-ALBUTEROL 0.5-2.5 (3) MG/3ML IN SOLN
9.0000 mL | Freq: Once | RESPIRATORY_TRACT | Status: AC
  Administered 2016-10-15: 9 mL via RESPIRATORY_TRACT
  Filled 2016-10-15: qty 9

## 2016-10-15 MED ORDER — ALBUTEROL SULFATE (2.5 MG/3ML) 0.083% IN NEBU
5.0000 mg | INHALATION_SOLUTION | Freq: Once | RESPIRATORY_TRACT | Status: AC
  Administered 2016-10-15: 5 mg via RESPIRATORY_TRACT

## 2016-10-15 MED ORDER — LISINOPRIL 10 MG PO TABS
10.0000 mg | ORAL_TABLET | Freq: Every day | ORAL | Status: DC
  Administered 2016-10-16 – 2016-10-20 (×5): 10 mg via ORAL
  Filled 2016-10-15 (×6): qty 1

## 2016-10-15 MED ORDER — METHYLPREDNISOLONE SODIUM SUCC 125 MG IJ SOLR: 60.0000 mg | Freq: Four times a day (QID) | INTRAMUSCULAR | Status: DC

## 2016-10-15 MED ORDER — SERTRALINE HCL 50 MG PO TABS
25.0000 mg | ORAL_TABLET | Freq: Two times a day (BID) | ORAL | Status: DC
  Administered 2016-10-15 – 2016-10-16 (×2): 25 mg via ORAL
  Administered 2016-10-16: 10:00:00 via ORAL
  Administered 2016-10-17 – 2016-10-20 (×7): 25 mg via ORAL
  Filled 2016-10-15: qty 1
  Filled 2016-10-15: qty 2
  Filled 2016-10-15 (×3): qty 1
  Filled 2016-10-15: qty 2
  Filled 2016-10-15 (×4): qty 1

## 2016-10-15 MED ORDER — BUDESONIDE 0.25 MG/2ML IN SUSP
0.2500 mg | Freq: Two times a day (BID) | RESPIRATORY_TRACT | Status: DC
  Administered 2016-10-16 – 2016-10-18 (×6): 0.25 mg via RESPIRATORY_TRACT
  Filled 2016-10-15 (×8): qty 2

## 2016-10-15 MED ORDER — ORAL CARE MOUTH RINSE
15.0000 mL | Freq: Two times a day (BID) | OROMUCOSAL | Status: DC
  Administered 2016-10-16 – 2016-10-18 (×3): 15 mL via OROMUCOSAL

## 2016-10-15 MED ORDER — ONDANSETRON HCL 4 MG PO TABS: 4.0000 mg | ORAL_TABLET | Freq: Four times a day (QID) | ORAL | Status: DC | PRN

## 2016-10-15 MED ORDER — DEXTROSE 5 % IV SOLN
500.0000 mg | INTRAVENOUS | Status: DC
  Filled 2016-10-15: qty 500

## 2016-10-15 MED ORDER — METHYLPREDNISOLONE SODIUM SUCC 125 MG IJ SOLR
125.0000 mg | Freq: Once | INTRAMUSCULAR | Status: AC
  Administered 2016-10-15: 125 mg via INTRAVENOUS
  Filled 2016-10-15: qty 2

## 2016-10-15 MED ORDER — SODIUM CHLORIDE 0.9 % IV SOLN
INTRAVENOUS | Status: DC
  Administered 2016-10-15 – 2016-10-18 (×5): via INTRAVENOUS

## 2016-10-15 MED ORDER — TIOTROPIUM BROMIDE MONOHYDRATE 18 MCG IN CAPS
18.0000 ug | ORAL_CAPSULE | Freq: Every day | RESPIRATORY_TRACT | Status: DC
  Administered 2016-10-15: 18 ug via RESPIRATORY_TRACT
  Filled 2016-10-15: qty 5

## 2016-10-15 MED ORDER — IPRATROPIUM-ALBUTEROL 0.5-2.5 (3) MG/3ML IN SOLN
3.0000 mL | RESPIRATORY_TRACT | Status: DC
  Administered 2016-10-16 – 2016-10-20 (×26): 3 mL via RESPIRATORY_TRACT
  Filled 2016-10-15 (×26): qty 3

## 2016-10-15 MED ORDER — ENOXAPARIN SODIUM 40 MG/0.4ML ~~LOC~~ SOLN
40.0000 mg | SUBCUTANEOUS | Status: DC
  Administered 2016-10-15 – 2016-10-19 (×5): 40 mg via SUBCUTANEOUS
  Filled 2016-10-15 (×5): qty 0.4

## 2016-10-15 MED ORDER — HYDROCHLOROTHIAZIDE 25 MG PO TABS
25.0000 mg | ORAL_TABLET | Freq: Every day | ORAL | Status: DC
  Administered 2016-10-16 – 2016-10-20 (×5): 25 mg via ORAL
  Filled 2016-10-15 (×5): qty 1

## 2016-10-15 MED ORDER — BISACODYL 10 MG RE SUPP: 10.0000 mg | Freq: Every day | RECTAL | Status: DC | PRN

## 2016-10-15 MED ORDER — DOCUSATE SODIUM 100 MG PO CAPS
100.0000 mg | ORAL_CAPSULE | Freq: Two times a day (BID) | ORAL | Status: DC
Start: 2016-10-15 — End: 2016-10-20
  Administered 2016-10-15 – 2016-10-20 (×10): 100 mg via ORAL
  Filled 2016-10-15 (×10): qty 1

## 2016-10-15 MED ORDER — IPRATROPIUM-ALBUTEROL 0.5-2.5 (3) MG/3ML IN SOLN
3.0000 mL | Freq: Four times a day (QID) | RESPIRATORY_TRACT | Status: DC
  Administered 2016-10-15: 3 mL via RESPIRATORY_TRACT
  Filled 2016-10-15: qty 3

## 2016-10-15 MED ORDER — ALBUTEROL SULFATE (2.5 MG/3ML) 0.083% IN NEBU
INHALATION_SOLUTION | RESPIRATORY_TRACT | Status: AC
  Filled 2016-10-15: qty 3

## 2016-10-15 MED ORDER — FAMOTIDINE IN NACL 20-0.9 MG/50ML-% IV SOLN
20.0000 mg | Freq: Two times a day (BID) | INTRAVENOUS | Status: DC
  Administered 2016-10-15 – 2016-10-16 (×2): 20 mg via INTRAVENOUS
  Filled 2016-10-15 (×2): qty 50

## 2016-10-15 MED ORDER — MOMETASONE FURO-FORMOTEROL FUM 200-5 MCG/ACT IN AERO
2.0000 | INHALATION_SPRAY | Freq: Two times a day (BID) | RESPIRATORY_TRACT | Status: DC
  Administered 2016-10-15: 2 via RESPIRATORY_TRACT
  Filled 2016-10-15: qty 8.8

## 2016-10-15 MED ORDER — DILTIAZEM HCL 60 MG PO TABS
120.0000 mg | ORAL_TABLET | Freq: Four times a day (QID) | ORAL | Status: DC
  Administered 2016-10-15 – 2016-10-20 (×18): 120 mg via ORAL
  Filled 2016-10-15 (×4): qty 2
  Filled 2016-10-15 (×3): qty 4
  Filled 2016-10-15 (×9): qty 2
  Filled 2016-10-15: qty 4
  Filled 2016-10-15 (×2): qty 2

## 2016-10-15 MED ORDER — METHYLPREDNISOLONE SODIUM SUCC 40 MG IJ SOLR
40.0000 mg | Freq: Two times a day (BID) | INTRAMUSCULAR | Status: DC
  Administered 2016-10-16 – 2016-10-20 (×9): 40 mg via INTRAVENOUS
  Filled 2016-10-15 (×9): qty 1

## 2016-10-15 MED ORDER — ONDANSETRON HCL 4 MG/2ML IJ SOLN
4.0000 mg | Freq: Four times a day (QID) | INTRAMUSCULAR | Status: DC | PRN
  Administered 2016-10-16: 4 mg via INTRAVENOUS
  Filled 2016-10-15: qty 2

## 2016-10-15 MED ORDER — AZITHROMYCIN 500 MG IV SOLR
500.0000 mg | Freq: Once | INTRAVENOUS | Status: AC
  Administered 2016-10-15: 500 mg via INTRAVENOUS
  Filled 2016-10-15: qty 500

## 2016-10-15 NOTE — ED Notes (Signed)
RT called to transport patient to ICU

## 2016-10-15 NOTE — H&P (Signed)
History and Physical    Jeremiah [name] KDX:[id number] DOB: October 16, 1950 DOA: 10/15/2016  Referring physician: Dr. Clearnce Hasten PCP: Herminio Commons, MD  Specialists: none  Chief Complaint: SOB  HPI: Jeremiah [name] is a 66 y.o. male has a past medical history significant for COPD and HTN who presents to ER with 4-5 day hx of progressive SOB with cough. CXR in ER OK but pt profoundly hypoxic with CO2 retention requiring BiPAP. He is now admitted. No fevfer. Denies CP. Still smoking.  Review of Systems: The patient denies anorexia, fever, weight loss,, vision loss, decreased hearing, hoarseness, chest pain, syncope, peripheral edema, balance deficits, hemoptysis, abdominal pain, melena, hematochezia, severe indigestion/heartburn, hematuria, incontinence, genital sores, muscle weakness, suspicious skin lesions, transient blindness, difficulty walking, depression, unusual weight change, abnormal bleeding, enlarged lymph nodes, angioedema, and breast masses.   Past Medical History:  Diagnosis Date  . Asthma   . COPD (chronic obstructive pulmonary disease) ([place])   . GERD (gastroesophageal reflux disease)   . HTN (hypertension)    Past Surgical History:  Procedure Laterality Date  . HERNIA REPAIR    . NECK SURGERY     Social History:  reports that he quit smoking about 14 months ago. His smoking use included Cigarettes. He smoked 0.50 packs per day. He has never used smokeless tobacco. He reports that he drinks alcohol. He reports that he does not use drugs.  Allergies  Allergen Reactions  . Penicillins Anaphylaxis, Swelling and Other (See Comments)    Has patient had a PCN reaction causing immediate rash, facial/tongue/throat swelling, SOB or lightheadedness with hypotension: Yes Has patient had a PCN reaction causing severe rash involving mucus membranes or skin necrosis: No Has patient had a PCN reaction that required hospitalization No Has patient had a PCN reaction occurring  within the last 10 years: No If all of the above answers are "NO", then may proceed with Cephalosporin use.    Family History  Problem Relation Age of Onset  . Cancer - Colon Father   . Congestive Heart Failure Mother     Prior to Admission medications   Medication Sig Start Date End Date Taking? Authorizing Provider  acetaminophen (TYLENOL) 325 MG tablet Take 2 tablets (650 mg total) by mouth every 6 (six) hours as needed for mild pain (or Fever >/= 101). 08/06/15  Yes Dustin Flock, MD  albuterol (PROVENTIL HFA;VENTOLIN HFA) 108 (90 Base) MCG/ACT inhaler Inhale 1-2 puffs into the lungs every 4 (four) hours as needed for wheezing or shortness of breath.    Yes [provider]  BREO ELLIPTA 100-25 MCG/INH AEPB Take 1 puff by mouth daily. 09/21/16  Yes [provider]  diltiazem (CARDIZEM) 120 MG tablet Take 1 tablet (120 mg total) by mouth 4 (four) times daily. 08/06/15  Yes Dustin Flock, MD  diphenhydrAMINE (BENADRYL) 25 mg capsule Take 1 capsule (25 mg total) by mouth every 6 (six) hours as needed for itching, allergies or sleep. 08/06/15  Yes Dustin Flock, MD  hydrochlorothiazide (HYDRODIURIL) 25 MG tablet Take 25 mg by mouth daily.   Yes [provider]  hydrOXYzine (ATARAX/VISTARIL) 25 MG tablet Take 25 mg by mouth every 8 (eight) hours as needed for anxiety.   Yes [provider]  ipratropium-albuterol (DUONEB) 0.5-2.5 (3) MG/3ML SOLN Take 3 mLs by nebulization every 4 (four) hours. 08/06/15  Yes Dustin Flock, MD  lisinopril (PRINIVIL,ZESTRIL) 10 MG tablet Take 10 mg by mouth daily.   Yes [provider]  omeprazole (  PRILOSEC) 20 MG capsule Take 20 mg by mouth daily.   Yes [provider]  sertraline (ZOLOFT) 25 MG tablet Take 25 mg by mouth 2 (two) times daily.   Yes [provider]  umeclidinium bromide (INCRUSE ELLIPTA) 62.5 MCG/INH AEPB Inhale 1 puff into the lungs daily. 11/23/15  Yes Laverle Hobby, MD   benzonatate (TESSALON) 200 MG capsule Take 1 capsule (200 mg total) by mouth 3 (three) times daily. Patient not taking: Reported on 10/15/2016 08/06/15   Dustin Flock, MD  Fluticasone-Salmeterol (ADVAIR) 500-50 MCG/DOSE AEPB Inhale 1 puff into the lungs 2 (two) times daily.    [provider]  guaiFENesin (MUCINEX) 600 MG 12 hr tablet Take 1 tablet (600 mg total) by mouth 2 (two) times daily. Patient not taking: Reported on 10/15/2016 08/06/15   Dustin Flock, MD  nicotine (NICODERM CQ - DOSED IN MG/24 HOURS) 21 mg/24hr patch Place 1 patch (21 mg total) onto the skin daily. Patient not taking: Reported on 10/15/2016 08/06/15   Dustin Flock, MD  oxyCODONE-acetaminophen (PERCOCET) 7.5-325 MG tablet Take 1 tablet by mouth every 4 (four) hours as needed for severe pain. Patient not taking: Reported on 10/15/2016 08/06/15   Dustin Flock, MD  pantoprazole (PROTONIX) 40 MG tablet Take 1 tablet (40 mg total) by mouth daily. Patient not taking: Reported on 10/15/2016 08/06/15   Dustin Flock, MD   Physical Exam: Vitals:   10/15/16 1649 10/15/16 1700 10/15/16 1730 10/15/16 1800  BP:  104/62 (!) 103/55 (!) 112/59  Pulse: 76 72  72  Resp: (!) 23 16 14  (!) 24  Temp:      TempSrc:      SpO2: 97% 98%  96%  Weight:      Height:         General:  Acutely ill appearing, WDWN. [place]/AT  Eyes: PERRL, EOMI, no scleral icterus, conjunctiva clear  ENT: moist oropharynx without exudate, TM's benign, dentition fair  Neck: supple, no lymphadenopathy. No bruits or thyromegaly  Cardiovascular: regular rate without MRG; 2+ peripheral pulses, no JVD, no peripheral edema  Respiratory: decreased breath sounds with diffuse wheezing. No rhonchi or rales. No dullness. Respiratory effort increased.  Abdomen: soft, non tender to palpation, positive bowel sounds, no guarding, no rebound  Skin: no rashes or lesions  Musculoskeletal: normal bulk and tone, no joint swelling  Psychiatric: normal mood  and affect, A&OX3  Neurologic: CN 2-12 grossly intact, Motor strength 5/5 in all 4 groups with symmetric DTR's and non-focal sensory exam  Labs on Admission:  Basic Metabolic Panel:  Recent Labs Lab 10/15/16 1534  NA 128*  K 4.3  CL 94*  CO2 25  GLUCOSE 128*  BUN 14  CREATININE 1.02  CALCIUM 9.0   Liver Function Tests: No results for input(s): AST, ALT, ALKPHOS, BILITOT, PROT, ALBUMIN in the last 168 hours. No results for input(s): LIPASE, AMYLASE in the last 168 hours. No results for input(s): AMMONIA in the last 168 hours. CBC:  Recent Labs Lab 10/15/16 1534  WBC 9.7  HGB 13.8  HCT 40.5  MCV 86.5  PLT 312   Cardiac Enzymes:  Recent Labs Lab 10/15/16 1534  TROPONINI <0.03    BNP (last 3 results) No results for input(s): BNP in the last 8760 hours.  ProBNP (last 3 results) No results for input(s): PROBNP in the last 8760 hours.  CBG: No results for input(s): GLUCAP in the last 168 hours.  Radiological Exams on Admission: Dg Chest 2 View  Result Date:  10/15/2016 CLINICAL DATA:  Shortness breath.  Chest pain.  Productive cough. EXAM: CHEST  2 VIEW COMPARISON:  Two-view chest x-rays 08/04/2015 FINDINGS: The heart size is normal. Atherosclerotic calcifications are present at the aortic arch. Chronic interstitial coarsening is similar the prior study. No focal airspace consolidation is present. There is no edema or effusion. Emphysematous changes are again noted. IMPRESSION: 1. No acute cardiopulmonary disease or significant interval change. 2. Emphysema. 3. Aortic atherosclerosis. Electronically Signed   By: San Morelle M.D.   On: 10/15/2016 16:41    EKG: Independently reviewed.  Assessment/Plan Principal Problem:   Acute respiratory failure (HCC) Active Problems:   COPD exacerbation (HCC)   Acute bronchitis   Tobacco dependency  Will admit to Stepdown on BiPAP. Begin IV ABX with IV steroids and SVN's. Consult Pulmonology. Wean off BiPAP as  tolerated. Repeat labs in AM. Begin IV fluids    Diet: clear liquids Fluids: NS@100  DVT Prophylaxis: Lovenox  Code Status: FULL  Family Communication: none  Disposition Plan: home  Time spent: 55 min

## 2016-10-15 NOTE — ED Notes (Signed)
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

## 2016-10-15 NOTE — Consult Note (Signed)
Name: Jeremiah [name] MRN: [id number] DOB: 06/27/1950    ADMISSION DATE:  10/15/2016 CONSULTATION DATE: 10/15/2016  REFERRING MD: Dr. Doy Hutching for dyspnea.   CHIEF COMPLAINT:  Shortness of Breath   BRIEF PATIENT DESCRIPTION:  66 yo male admitted with acute on chronic hypoxic respiratory failure secondary to AECOPD with acute bronchitis requiring Bipap   SIGNIFICANT EVENTS  06/10-Pt admitted to [facility]   STUDIES:  None   HISTORY OF PRESENT ILLNESS:   This is a 66 yo male with a PMH of HTN, GERD, COPD, Chronic home O2 @ 2L, Current Everyday Smoker, and Asthma.  He presented to [facility] ER 06/10 with c/o progressive shortness of breath with nonproductive cough and chest tightness onset of symptoms 4-5 days prior to presentation to ER. He normally is on 2L of O2, however due to persistent hypoxia with O2 sats at 87% he increased his home O2 to 3L.  Upon arrival to the ER pt was placed on continuous Bipap due to respiratory failure.  His outpatient pulmonologist is Dr. Raul Del.  PAST MEDICAL HISTORY :   has a past medical history of Asthma; COPD (chronic obstructive pulmonary disease) ([place]); GERD (gastroesophageal reflux disease); and HTN (hypertension).  has a past surgical history that includes Hernia repair and Neck surgery. Prior to Admission medications   Medication Sig Start Date End Date Taking? Authorizing Provider  acetaminophen (TYLENOL) 325 MG tablet Take 2 tablets (650 mg total) by mouth every 6 (six) hours as needed for mild pain (or Fever >/= 101). 08/06/15  Yes Dustin Flock, MD  albuterol (PROVENTIL HFA;VENTOLIN HFA) 108 (90 Base) MCG/ACT inhaler Inhale 1-2 puffs into the lungs every 4 (four) hours as needed for wheezing or shortness of breath.    Yes [provider]  BREO ELLIPTA 100-25 MCG/INH AEPB Take 1 puff by mouth daily. 09/21/16  Yes [provider]  diltiazem (CARDIZEM) 120 MG tablet Take 1 tablet (120 mg total) by mouth 4 (four) times daily.  08/06/15  Yes Dustin Flock, MD  diphenhydrAMINE (BENADRYL) 25 mg capsule Take 1 capsule (25 mg total) by mouth every 6 (six) hours as needed for itching, allergies or sleep. 08/06/15  Yes Dustin Flock, MD  hydrochlorothiazide (HYDRODIURIL) 25 MG tablet Take 25 mg by mouth daily.   Yes [provider]  hydrOXYzine (ATARAX/VISTARIL) 25 MG tablet Take 25 mg by mouth every 8 (eight) hours as needed for anxiety.   Yes [provider]  ipratropium-albuterol (DUONEB) 0.5-2.5 (3) MG/3ML SOLN Take 3 mLs by nebulization every 4 (four) hours. 08/06/15  Yes Dustin Flock, MD  lisinopril (PRINIVIL,ZESTRIL) 10 MG tablet Take 10 mg by mouth daily.   Yes [provider]  omeprazole (PRILOSEC) 20 MG capsule Take 20 mg by mouth daily.   Yes [provider]  sertraline (ZOLOFT) 25 MG tablet Take 25 mg by mouth 2 (two) times daily.   Yes [provider]  umeclidinium bromide (INCRUSE ELLIPTA) 62.5 MCG/INH AEPB Inhale 1 puff into the lungs daily. 11/23/15  Yes Laverle Hobby, MD  benzonatate (TESSALON) 200 MG capsule Take 1 capsule (200 mg total) by mouth 3 (three) times daily. Patient not taking: Reported on 10/15/2016 08/06/15   Dustin Flock, MD  Fluticasone-Salmeterol (ADVAIR) 500-50 MCG/DOSE AEPB Inhale 1 puff into the lungs 2 (two) times daily.    [provider]  guaiFENesin (MUCINEX) 600 MG 12 hr tablet Take 1 tablet (600 mg total) by mouth 2 (two) times daily. Patient not taking: Reported on 10/15/2016 08/06/15  Dustin Flock, MD  nicotine (NICODERM CQ - DOSED IN MG/24 HOURS) 21 mg/24hr patch Place 1 patch (21 mg total) onto the skin daily. Patient not taking: Reported on 10/15/2016 08/06/15   Dustin Flock, MD  oxyCODONE-acetaminophen (PERCOCET) 7.5-325 MG tablet Take 1 tablet by mouth every 4 (four) hours as needed for severe pain. Patient not taking: Reported on 10/15/2016 08/06/15   Dustin Flock, MD  pantoprazole (PROTONIX) 40 MG tablet  Take 1 tablet (40 mg total) by mouth daily. Patient not taking: Reported on 10/15/2016 08/06/15   Dustin Flock, MD   Allergies  Allergen Reactions  . Penicillins Anaphylaxis, Swelling and Other (See Comments)    Has patient had a PCN reaction causing immediate rash, facial/tongue/throat swelling, SOB or lightheadedness with hypotension: Yes Has patient had a PCN reaction causing severe rash involving mucus membranes or skin necrosis: No Has patient had a PCN reaction that required hospitalization No Has patient had a PCN reaction occurring within the last 10 years: No If all of the above answers are "NO", then may proceed with Cephalosporin use.    FAMILY HISTORY:  family history includes Cancer - Colon in his father; Congestive Heart Failure in his mother. SOCIAL HISTORY:  reports that he quit smoking about 14 months ago. His smoking use included Cigarettes. He smoked 0.50 packs per day. He has never used smokeless tobacco. He reports that he drinks alcohol. He reports that he does not use drugs.  REVIEW OF SYSTEMS: Positives in BOLD  Constitutional: Negative for fever, chills, weight loss, malaise/fatigue and diaphoresis.  HENT: Negative for hearing loss, ear pain, nosebleeds, congestion, sore throat, neck pain, tinnitus and ear discharge.   Eyes: Negative for blurred vision, double vision, photophobia, pain, discharge and redness.  Respiratory: nonproductive cough, hemoptysis, sputum production, shortness of breath, wheezing and stridor.   Cardiovascular: chest tightness, palpitations, orthopnea, claudication, leg swelling and PND.  Gastrointestinal: Negative for heartburn, nausea, vomiting, abdominal pain, diarrhea, constipation, blood in stool and melena.  Genitourinary: Negative for dysuria, urgency, frequency, hematuria and flank pain.  Musculoskeletal: Negative for myalgias, back pain, joint pain and falls.  Skin: Negative for itching and rash.  Neurological: Negative for  dizziness, tingling, tremors, sensory change, speech change, focal weakness, seizures, loss of consciousness, weakness and headaches.  Endo/Heme/Allergies: Negative for environmental allergies and polydipsia. Does not bruise/bleed easily.  SUBJECTIVE:  Pt states he feels better since being placed on Bipap.  VITAL SIGNS: Temp:  [96.9 F (36.1 C)-97.7 F (36.5 C)] 96.9 F (36.1 C) (06/10 2000) Pulse Rate:  [69-79] 74 (06/10 2253) Resp:  [14-38] 21 (06/10 2253) BP: (103-135)/(55-69) 104/60 (06/10 1930) SpO2:  [90 %-98 %] 95 % (06/10 2253) FiO2 (%):  [40 %-97 %] 97 % (06/10 2105) Weight:  [92.1 kg (203 lb 0.7 oz)-96.2 kg (212 lb)] 92.1 kg (203 lb 0.7 oz) (06/10 2000)  PHYSICAL EXAMINATION: General: well developed, well nourished Caucasian male, NAD Neuro: alert and oriented, follows commands  HEENT: supple, no JVD Cardiovascular: nsr, s1s2, no M/R/G Lungs: diminished throughout; no wheezes, rhonchi, or crackles, even, non labored on Bipap  Abdomen: +BS x4, non distended, non tender, soft Musculoskeletal: normal bulk and tone, no edema Skin: intact no rashes or lesions   Recent Labs Lab 10/15/16 1534  NA 128*  K 4.3  CL 94*  CO2 25  BUN 14  CREATININE 1.02  GLUCOSE 128*    Recent Labs Lab 10/15/16 1534  HGB 13.8  HCT 40.5  WBC 9.7  PLT 312  Dg Chest 2 View  Result Date: 10/15/2016 CLINICAL DATA:  Shortness breath.  Chest pain.  Productive cough. EXAM: CHEST  2 VIEW COMPARISON:  Two-view chest x-rays 08/04/2015 FINDINGS: The heart size is normal. Atherosclerotic calcifications are present at the aortic arch. Chronic interstitial coarsening is similar the prior study. No focal airspace consolidation is present. There is no edema or effusion. Emphysematous changes are again noted. IMPRESSION: 1. No acute cardiopulmonary disease or significant interval change. 2. Emphysema. 3. Aortic atherosclerosis. Electronically Signed   By: San Morelle M.D.   On: 10/15/2016  16:41    ASSESSMENT / PLAN: Acute on chronic hypoxic respiratory failure secondary to AECOPD and acute bronchitis  Hyponatremia  Hx: HTN, GERD, and Asthma  P: Prn Bipap for hypoxia or dyspnea  Maintain O2 sats 88% to 92% Intermittent CXR Continue scheduled bronchodilator therapy and nebulized steroids Wean IV steroids  Trend WBC and monitor fever curve Trend PCT's Continue abx NS @100  ml/hr Continue outpatient hydrochlorothiazide, lisinopril, and diltiazem Pepcid for GERD Lovenox for VTE prophylaxis Monitor for s/sx of bleeding Trend CBC Smoking cessation counseling provided   Marda Stalker, [place] Pager [phone] (please enter 7 digits) PCCM Consult Pager [phone] (please enter 7 digits)    STAFF NOTE: I. Dr. Ashby Dawes, have personally reviewed the patient's available data including medical history , events of notes, physican examination and test results as part of my evaluation. I have discussed with the  Care with the NP and other care providers including  pharmacist, ICU RN, RRT, dietary.  Physical Exam  Patient is awake and alert, appears slightly dyspneic. Lungs clear to all station with decreased air entry in both bases. I personally reviewed. Chest x-ray imaging and lab results. Is consistent with the severe emphysema with COPD exacerbation, and acute hypoxic respiratory failure secondary to above. Continue IV steroids, inhaled bronchodilators. Will wean off BiPAP as tolerated.  Marda Stalker, MD.   Board Certified in Internal Medicine, Pulmonary Medicine, [place], and Sleep Medicine.  [facility] Office Number: [phone] Pager: [phone]  Patricia Pesa, M.D.  Merton Border, M.D 10/16/2016

## 2016-10-15 NOTE — ED Provider Notes (Signed)
[facility] Emergency Department Provider Note  ____________________________________________   First MD Initiated Contact with Patient 10/15/16 1640     (approximate)  I have reviewed the triage vital signs and the nursing notes.   HISTORY  Chief Complaint Chest Pain and Shortness of Breath   HPI Jeremiah [name] is a 66 y.o. male with a history of COPD as well as hypertension was presented to the emergency department with 1 week of worsening shortness of breath as well as chest pain. He says that the chest pain is a tight pain across the front of his chest. He says it is constant and moderate. He is also had increasing shortness of breath with gasping for air. He says that he was here in the hospital about a year and half ago and was a minute for about 3 weeks because of his COPD. He says that he still smokes about a pack per day. Is normally on 2 L of nasal cannula oxygen but is requiring 3 L at this time because of hypoxia to 87%.Patient denies any radiation of the pain.   Past Medical History:  Diagnosis Date  . Asthma   . COPD (chronic obstructive pulmonary disease) ([place])   . GERD (gastroesophageal reflux disease)   . HTN (hypertension)     Patient Active Problem List   Diagnosis Date Noted  . COPD exacerbation ([place]) 07/28/2015    Past Surgical History:  Procedure Laterality Date  . HERNIA REPAIR    . NECK SURGERY      Prior to Admission medications   Medication Sig Start Date End Date Taking? Authorizing Provider  acetaminophen (TYLENOL) 325 MG tablet Take 2 tablets (650 mg total) by mouth every 6 (six) hours as needed for mild pain (or Fever >/= 101). 08/06/15   Dustin Flock, MD  albuterol (PROVENTIL HFA;VENTOLIN HFA) 108 (90 Base) MCG/ACT inhaler Inhale 1-2 puffs into the lungs every 4 (four) hours as needed for wheezing or shortness of breath.     [provider]  benzonatate (TESSALON) 200 MG capsule Take 1 capsule (200 mg  total) by mouth 3 (three) times daily. 08/06/15   Dustin Flock, MD  diltiazem (CARDIZEM) 120 MG tablet Take 1 tablet (120 mg total) by mouth 4 (four) times daily. 08/06/15   Dustin Flock, MD  diphenhydrAMINE (BENADRYL) 25 mg capsule Take 1 capsule (25 mg total) by mouth every 6 (six) hours as needed for itching, allergies or sleep. 08/06/15   Dustin Flock, MD  Fluticasone-Salmeterol (ADVAIR) 500-50 MCG/DOSE AEPB Inhale 1 puff into the lungs 2 (two) times daily.    [provider]  guaiFENesin (MUCINEX) 600 MG 12 hr tablet Take 1 tablet (600 mg total) by mouth 2 (two) times daily. 08/06/15   Dustin Flock, MD  hydrochlorothiazide (HYDRODIURIL) 25 MG tablet Take 25 mg by mouth daily.    [provider]  hydrOXYzine (ATARAX/VISTARIL) 25 MG tablet Take 25 mg by mouth every 8 (eight) hours as needed for anxiety.    [provider]  ipratropium-albuterol (DUONEB) 0.5-2.5 (3) MG/3ML SOLN Take 3 mLs by nebulization every 4 (four) hours. 08/06/15   Dustin Flock, MD  lisinopril (PRINIVIL,ZESTRIL) 10 MG tablet Take 10 mg by mouth daily.    [provider]  nicotine (NICODERM CQ - DOSED IN MG/24 HOURS) 21 mg/24hr patch Place 1 patch (21 mg total) onto the skin daily. 08/06/15   Dustin Flock, MD  omeprazole (PRILOSEC) 20 MG capsule Take 20 mg by mouth daily.  [provider]  oxyCODONE-acetaminophen (PERCOCET) 7.5-325 MG tablet Take 1 tablet by mouth every 4 (four) hours as needed for severe pain. 08/06/15   Dustin Flock, MD  pantoprazole (PROTONIX) 40 MG tablet Take 1 tablet (40 mg total) by mouth daily. 08/06/15   Dustin Flock, MD  umeclidinium bromide (INCRUSE ELLIPTA) 62.5 MCG/INH AEPB Inhale 1 puff into the lungs daily. 11/23/15   Laverle Hobby, MD    Allergies Penicillins  Family History  Problem Relation Age of Onset  . Cancer - Colon Father   . Congestive Heart Failure Mother     Social History Social History  Substance Use  Topics  . Smoking status: Former Smoker    Packs/day: 0.50    Types: Cigarettes    Quit date: 07/28/2015  . Smokeless tobacco: Not on file  . Alcohol use 0.0 oz/week     Comment: beers occasionally    Review of Systems  Constitutional: No fever/chills Eyes: No visual changes. ENT: No sore throat. Cardiovascular: as above Respiratory: as above Gastrointestinal: No abdominal pain.  No nausea, no vomiting.  No diarrhea.  No constipation. Genitourinary: Negative for dysuria. Musculoskeletal: Negative for back pain. Skin: Negative for rash. Neurological: Negative for headaches, focal weakness or numbness.   ____________________________________________   PHYSICAL EXAM:  VITAL SIGNS: ED Triage Vitals  Enc Vitals Group     BP 10/15/16 1518 (!) 135/58     Pulse Rate 10/15/16 1518 79     Resp 10/15/16 1518 (!) 38     Temp 10/15/16 1518 97.7 F (36.5 C)     Temp Source 10/15/16 1518 Oral     SpO2 10/15/16 1518 90 %     Weight 10/15/16 1519 212 lb (96.2 kg)     Height 10/15/16 1519 6\' 1"  (1.854 m)     Head Circumference --      Peak Flow --      Pain Score 10/15/16 1535 8     Pain Loc --      Pain Edu? --      Excl. in [place]? --     Constitutional: Alert and oriented. in no acute distress. Eyes: Conjunctivae are normal.  Head: Atraumatic. Nose: No congestion/rhinnorhea. Mouth/Throat: Mucous membranes are moist.  Neck: No stridor.   Cardiovascular: Normal rate, regular rhythm. Grossly normal heart sounds.   Respiratory: Patient with increased work of breathing with retractions and tripoding. Wheezing throughout with decreased air movement. Gastrointestinal: Soft and nontender. No distention.  Musculoskeletal: No lower extremity tenderness nor edema.  No joint effusions. Neurologic:  Normal speech and language. No gross focal neurologic deficits are appreciated. Skin:  Skin is warm, dry and intact. No rash noted. Psychiatric: Mood and affect are normal. Speech and behavior  are normal.  ____________________________________________   LABS (all labs ordered are listed, but only abnormal results are displayed)  Labs Reviewed  BASIC METABOLIC PANEL - Abnormal; Notable for the following:       Result Value   Sodium 128 (*)    Chloride 94 (*)    Glucose, Bld 128 (*)    All other components within normal limits  CBC  TROPONIN I   ____________________________________________  EKG  ED ECG REPORT I, Doran Stabler, the attending physician, personally viewed and interpreted this ECG.   Date: 10/15/2016  EKG Time: 1514  Rate: 79  Rhythm: normal sinus rhythm with pvcs  Axis: normal  Intervals:none  ST&T Change: No ST segment elevation or depression. No abnormal T-wave inversion.  ____________________________________________  RADIOLOGY  No acute finding on the chest x-ray. ____________________________________________   PROCEDURES  Procedure(s) performed:  CRITICAL CARE Performed by: Doran Stabler   Total critical care time: 35 minutes  Critical care time was exclusive of separately billable procedures and treating other patients.  Critical care was necessary to treat or prevent imminent or life-threatening deterioration.  Critical care was time spent personally by me on the following activities: development of treatment plan with patient and/or surrogate as well as nursing, discussions with consultants, evaluation of patient's response to treatment, examination of patient, obtaining history from patient or surrogate, ordering and performing treatments and interventions, ordering and review of laboratory studies, ordering and review of radiographic studies, pulse oximetry and re-evaluation of patient's condition.   Procedures  Critical Care performed:   ____________________________________________   INITIAL IMPRESSION / ASSESSMENT AND PLAN / ED COURSE  Pertinent labs & imaging results that were available during my care of the  patient were reviewed by me and considered in my medical decision making (see chart for details).  ----------------------------------------- 6:37 PM on 10/15/2016 -----------------------------------------  Patient placed on BiPAP and tolerating well. Patient will be admitted to the hospital. Signed out to Dr. Doy Hutching. Patient aware of the need for admission willing to comply.      ____________________________________________   FINAL CLINICAL IMPRESSION(S) / ED DIAGNOSES  COPD exacerbation. Hypoxia.    NEW MEDICATIONS STARTED DURING THIS VISIT:  New Prescriptions   No medications on file     Note:  This document was prepared using Dragon voice recognition software and may include unintentional dictation errors.     Orbie Pyo, MD 10/15/16 [phone]

## 2016-10-15 NOTE — Progress Notes (Signed)
eLink Physician-Brief Progress Note Patient Name: Jeremiah [name] DOB: 07-17-1950 MRN: [id number]   Date of Service  10/15/2016  HPI/Events of Note  66 yr old male admitted with COPD exac.  Resp acidosis suggested by VBG.  No acute findings on cxr.  Appears to be tolerating bipap well.  Chart reviewed.  Agree with present treatment plan.  eICU Interventions       Intervention Category Evaluation Type: New Patient Evaluation  CARMELLO, CABINESS, P 10/15/2016, 8:49 PM

## 2016-10-15 NOTE — ED Triage Notes (Signed)
Pt arrives with complaints of SOB and chest pain, pt states green productive cough for several days with chest tightness, pt on home 02 but arrives in ED on RA,  Hx of COPD, placed on 2L [place]

## 2016-10-15 NOTE — ED Notes (Addendum)
Due to O2 sat of 87 RN changed oxygen from 2L to 3L

## 2016-10-16 LAB — COMPREHENSIVE METABOLIC PANEL
ALT: 30 U/L (ref 17–63)
AST: 25 U/L (ref 15–41)
Albumin: 3.9 g/dL (ref 3.5–5.0)
Alkaline Phosphatase: 133 U/L — ABNORMAL HIGH (ref 38–126)
Anion gap: 9 (ref 5–15)
BUN: 13 mg/dL (ref 6–20)
CO2: 27 mmol/L (ref 22–32)
Calcium: 9 mg/dL (ref 8.9–10.3)
Chloride: 94 mmol/L — ABNORMAL LOW (ref 101–111)
Creatinine, Ser: 0.66 mg/dL (ref 0.61–1.24)
GFR calc Af Amer: >60 mL/min (ref >60)
GFR calc non Af Amer: >60 mL/min (ref >60)
Glucose, Bld: 142 mg/dL — ABNORMAL HIGH (ref 65–99)
Potassium: 4.5 mmol/L (ref 3.5–5.1)
Sodium: 130 mmol/L — ABNORMAL LOW (ref 135–145)
Total Bilirubin: 0.7 mg/dL (ref 0.3–1.2)
Total Protein: 7.9 g/dL (ref 6.5–8.1)

## 2016-10-16 LAB — PROCALCITONIN

## 2016-10-16 LAB — CBC
HCT: 40.1 % (ref 40.0–52.0)
Hemoglobin: 14.1 g/dL (ref 13.0–18.0)
MCH: 30.3 pg (ref 26.0–34.0)
MCHC: 35.1 g/dL (ref 32.0–36.0)
MCV: 86.3 fL (ref 80.0–100.0)
Platelets: 303 10*3/uL (ref 150–440)
RBC: 4.64 MIL/uL (ref 4.40–5.90)
RDW: 14.3 % (ref 11.5–14.5)
WBC: 7.2 10*3/uL (ref 3.8–10.6)

## 2016-10-16 LAB — GLUCOSE, CAPILLARY: Glucose-Capillary: 152 mg/dL — ABNORMAL HIGH (ref 65–99)

## 2016-10-16 MED ORDER — FAMOTIDINE 20 MG PO TABS
20.0000 mg | ORAL_TABLET | Freq: Two times a day (BID) | ORAL | Status: DC
  Administered 2016-10-16 – 2016-10-20 (×8): 20 mg via ORAL
  Filled 2016-10-16 (×8): qty 1

## 2016-10-16 MED ORDER — AZITHROMYCIN 500 MG PO TABS
500.0000 mg | ORAL_TABLET | ORAL | Status: AC
  Administered 2016-10-16 – 2016-10-19 (×4): 500 mg via ORAL
  Filled 2016-10-16 (×4): qty 1

## 2016-10-16 MED ORDER — OXYCODONE-ACETAMINOPHEN 5-325 MG PO TABS
1.0000 | ORAL_TABLET | Freq: Four times a day (QID) | ORAL | Status: DC | PRN
  Administered 2016-10-18 – 2016-10-20 (×6): 1 via ORAL
  Filled 2016-10-16 (×6): qty 1

## 2016-10-16 NOTE — Progress Notes (Signed)
CONCERNING: Antibiotic IV to Oral Route Change Policy  RECOMMENDATION: This patient is receiving azithromycin and famotidine by the intravenous route.  Based on criteria approved by the Pharmacy and Therapeutics Committee, the antibiotic(s) is/are being converted to the equivalent oral dose form(s).   DESCRIPTION: These criteria include:  Patient being treated for a respiratory tract infection, urinary tract infection, cellulitis or clostridium difficile associated diarrhea if on metronidazole  The patient is not neutropenic and does not exhibit a GI malabsorption state  The patient is eating (either orally or via tube) and/or has been taking other orally administered medications for a least 24 hours  The patient is improving clinically and has a Tmax < 100.5  If you have questions about this conversion, please contact the Pharmacy Department  []   [phone] )  [facility] [x]   [phone] )  [facility] []   [phone] )  [facility] []   [phone] )  [facility] []   [phone] )  [place] [phone]

## 2016-10-16 NOTE — Progress Notes (Signed)
Report called to [facility] on 1C

## 2016-10-16 NOTE — Progress Notes (Signed)
[place] [facility] [place] NAME: Jeremiah [name]    MR#:  [id number]  DATE OF BIRTH:  [birth year]  SUBJECTIVE:  CHIEF COMPLAINT:   Chief Complaint  Patient presents with  . Chest Pain  . Shortness of Breath  The patient is 66 year old Caucasian male with history of ongoing tobacco abuse, COPD, chronic respiratory failure, on 2 L of oxygen at home, hypertension, who presents to the hospital with 4-5 day history of progressive shortness of breath and cough. In emergency room, he was noted to be profoundly hypoxic, he was initiated on BiPAP and admitted to the intensive care unit. He was now weaned off BiPAP, now on 6 L of oxygen, complains of some shortness of breath, wheezing, chest tightness. Troponin was found to be normal. Chest x-ray revealed no pneumonia.  Review of Systems  Constitutional: Negative for chills, fever and weight loss.  HENT: Negative for congestion.   Eyes: Negative for blurred vision and double vision.  Respiratory: Positive for cough, sputum production, shortness of breath and wheezing.   Cardiovascular: Negative for chest pain, palpitations, orthopnea, leg swelling and PND.  Gastrointestinal: Negative for abdominal pain, blood in stool, constipation, diarrhea, nausea and vomiting.  Genitourinary: Negative for dysuria, frequency, hematuria and urgency.  Musculoskeletal: Negative for falls.  Neurological: Negative for dizziness, tremors, focal weakness and headaches.  Endo/Heme/Allergies: Does not bruise/bleed easily.  Psychiatric/Behavioral: Negative for depression. The patient does not have insomnia.     VITAL SIGNS: Blood pressure 120/65, pulse 76, temperature 97.5 F (36.4 C), resp. rate 13, height 6\' 1"  (1.854 m), weight 92.1 kg (203 lb 0.7 oz), SpO2 94 %.  PHYSICAL EXAMINATION:   GENERAL:  66 y.o.-year-old patient lying in the bed in moderate respiratory distress, tachypneic, uncomfortable, has accessory muscle  retractions.  EYES: Pupils equal, round, reactive to light and accommodation. No scleral icterus. Extraocular muscles intact.  HEENT: Head atraumatic, normocephalic. Oropharynx and nasopharynx clear.  NECK:  Supple, no jugular venous distention. No thyroid enlargement, no tenderness.  LUNGS: Diminished breath sounds bilaterally, scattered wheezing, no rales,rhonchi or crepitations. Using accessory muscles of respiration, uncomfortable, dyspneic.  CARDIOVASCULAR: S1, S2 normal. No murmurs, rubs, or gallops.  ABDOMEN: Soft, nontender, nondistended. Bowel sounds present. No organomegaly or mass.  EXTREMITIES: No pedal edema, cyanosis, or clubbing.  NEUROLOGIC: Cranial nerves II through XII are intact. Muscle strength 5/5 in all extremities. Sensation intact. Gait not checked.  PSYCHIATRIC: The patient is alert and oriented x 3.  SKIN: No obvious rash, lesion, or ulcer.   ORDERS/RESULTS REVIEWED:   CBC  Recent Labs Lab 10/15/16 1534 10/16/16 0355  WBC 9.7 7.2  HGB 13.8 14.1  HCT 40.5 40.1  PLT 312 303  MCV 86.5 86.3  MCH 29.3 30.3  MCHC 33.9 35.1  RDW 14.3 14.3   ------------------------------------------------------------------------------------------------------------------  Chemistries   Recent Labs Lab 10/15/16 1534 10/16/16 0355  NA 128* 130*  K 4.3 4.5  CL 94* 94*  CO2 25 27  GLUCOSE 128* 142*  BUN 14 13  CREATININE 1.02 0.66  CALCIUM 9.0 9.0  AST  --  25  ALT  --  30  ALKPHOS  --  133*  BILITOT  --  0.7   ------------------------------------------------------------------------------------------------------------------ estimated creatinine clearance is 102.6 mL/min (by C-G formula based on SCr of 0.66 mg/dL). ------------------------------------------------------------------------------------------------------------------ No results for input(s): TSH, T4TOTAL, T3FREE, THYROIDAB in the last 72 hours.  Invalid input(s): FREET3  Cardiac Enzymes  Recent  Labs Lab 10/15/16 1534  TROPONINI <0.03   ------------------------------------------------------------------------------------------------------------------ Invalid input(s): POCBNP ---------------------------------------------------------------------------------------------------------------  RADIOLOGY: Dg Chest 2 View  Result Date: 10/15/2016 CLINICAL DATA:  Shortness breath.  Chest pain.  Productive cough. EXAM: CHEST  2 VIEW COMPARISON:  Two-view chest x-rays 08/04/2015 FINDINGS: The heart size is normal. Atherosclerotic calcifications are present at the aortic arch. Chronic interstitial coarsening is similar the prior study. No focal airspace consolidation is present. There is no edema or effusion. Emphysematous changes are again noted. IMPRESSION: 1. No acute cardiopulmonary disease or significant interval change. 2. Emphysema. 3. Aortic atherosclerosis. Electronically Signed   By: San Morelle M.D.   On: 10/15/2016 16:41    EKG:  Orders placed or performed during the hospital encounter of 10/15/16  . EKG 12-Lead  . EKG 12-Lead  . ED EKG within 10 minutes  . ED EKG within 10 minutes    ASSESSMENT AND PLAN:  Principal Problem:   Acute respiratory failure (HCC) Active Problems:   COPD exacerbation (HCC)   Acute bronchitis   Tobacco dependency  #1 Acute on chronic respiratory failure with hypoxia, continue oxygen therapy at 6 L, weaning down to 2 L, patient's baseline as tolerated #2. COPD exacerbation, patient was seen by intensivist, Dr. Ashby Dawes, recommended to continue bronchodilator and nebulized steroid therapy, IV steroids, antibiotics, following closely clinically, now off BiPAP #3. Acute bronchitis, get sputum cultures if possible, continue Zithromax #4. Essential hypertension, satisfactory controled on current medications, continue Cardizem, HCTZ, lisinopril #5. Tobacco abuse. Counseling, discussed with patient for 4 minutes, nicotine replacement therapy is  initiated, patient is agreeable, he is trying to quit Management plans discussed with the patient, family and they are in agreement.   DRUG ALLERGIES:  Allergies  Allergen Reactions  . Penicillins Anaphylaxis, Swelling and Other (See Comments)    Has patient had a PCN reaction causing immediate rash, facial/tongue/throat swelling, SOB or lightheadedness with hypotension: Yes Has patient had a PCN reaction causing severe rash involving mucus membranes or skin necrosis: No Has patient had a PCN reaction that required hospitalization No Has patient had a PCN reaction occurring within the last 10 years: No If all of the above answers are "NO", then may proceed with Cephalosporin use.    CODE STATUS:     Code Status Orders        Start     Ordered   10/15/16 2012  Full code  Continuous     10/15/16 2011    Code Status History    Date Active Date Inactive Code Status Order ID Comments User Context   07/28/2015  2:57 PM 08/06/2015  8:06 PM Full Code [id number]  Gladstone Lighter, MD Inpatient      TOTAL TIME TAKING CARE OF THIS PATIENT: 35 minutes.    Theodoro Grist M.D on 10/16/2016 at 3:16 PM  Between 7am to 6pm - Pager - [phone]  After 6pm go to [email or web address] - password [email or web address] [place] [facility]  Office  [phone]  CC: Primary care physician; Herminio Commons, MD

## 2016-10-17 LAB — CBC
HEMATOCRIT: 38.3 % — AB (ref 40.0–52.0)
HEMOGLOBIN: 13.2 g/dL (ref 13.0–18.0)
MCH: 29.3 pg (ref 26.0–34.0)
MCHC: 34.3 g/dL (ref 32.0–36.0)
MCV: 85.4 fL (ref 80.0–100.0)
Platelets: 328 10*3/uL (ref 150–440)
RBC: 4.49 MIL/uL (ref 4.40–5.90)
RDW: 14.4 % (ref 11.5–14.5)
WBC: 15.9 10*3/uL — ABNORMAL HIGH (ref 3.8–10.6)

## 2016-10-17 LAB — PROCALCITONIN: Procalcitonin: <0.1 ng/mL

## 2016-10-17 LAB — BASIC METABOLIC PANEL
Anion gap: 7 (ref 5–15)
BUN: 21 mg/dL — ABNORMAL HIGH (ref 6–20)
CALCIUM: 8.7 mg/dL — AB (ref 8.9–10.3)
CO2: 27 mmol/L (ref 22–32)
Chloride: 94 mmol/L — ABNORMAL LOW (ref 101–111)
Creatinine, Ser: 0.59 mg/dL — ABNORMAL LOW (ref 0.61–1.24)
GFR calc non Af Amer: >60 mL/min (ref >60)
Glucose, Bld: 180 mg/dL — ABNORMAL HIGH (ref 65–99)
Potassium: 4.7 mmol/L (ref 3.5–5.1)
Sodium: 128 mmol/L — ABNORMAL LOW (ref 135–145)

## 2016-10-17 LAB — GLUCOSE, CAPILLARY: GLUCOSE-CAPILLARY: 121 mg/dL — AB (ref 65–99)

## 2016-10-17 LAB — MAGNESIUM: MAGNESIUM: 2 mg/dL (ref 1.7–2.4)

## 2016-10-17 MED ORDER — IPRATROPIUM-ALBUTEROL 0.5-2.5 (3) MG/3ML IN SOLN: 3.0000 mL | RESPIRATORY_TRACT | Status: DC | PRN

## 2016-10-17 NOTE — Progress Notes (Signed)
[facility] [place] NAME: Jeremiah [name]    MR#:  [id number]  DATE OF BIRTH:  July 09, 1950  SUBJECTIVE:  CHIEF COMPLAINT:   Chief Complaint  Patient presents with  . Chest Pain  . Shortness of Breath  The patient is 66 year old Caucasian male with history of ongoing tobacco abuse, COPD, chronic respiratory failure, on 2 L of oxygen at home, hypertension, who presents to the hospital with 4-5 day history of progressive shortness of breath and cough. In emergency room, he was noted to be profoundly hypoxic, he was initiated on BiPAP and admitted to the intensive care unit. He was now weaned off BiPAP, now on 6 L of oxygen, complains of some shortness of breath, wheezing, chest tightness. Troponin was found to be normal. Chest x-ray revealed no pneumonia. The patient feels satisfactory today, continues to wheeze and have shortness of breath intermittently, remains on 5 L of oxygen nasal cannula with good oxygen saturations Review of Systems  Constitutional: Negative for chills, fever and weight loss.  HENT: Negative for congestion.   Eyes: Negative for blurred vision and double vision.  Respiratory: Positive for cough, sputum production, shortness of breath and wheezing.   Cardiovascular: Negative for chest pain, palpitations, orthopnea, leg swelling and PND.  Gastrointestinal: Negative for abdominal pain, blood in stool, constipation, diarrhea, nausea and vomiting.  Genitourinary: Negative for dysuria, frequency, hematuria and urgency.  Musculoskeletal: Negative for falls.  Neurological: Negative for dizziness, tremors, focal weakness and headaches.  Endo/Heme/Allergies: Does not bruise/bleed easily.  Psychiatric/Behavioral: Negative for depression. The patient does not have insomnia.     VITAL SIGNS: Blood pressure (!) 128/110, pulse 82, temperature 98.6 F (37 C), resp. rate 18, height 6\' 1"  (1.854 m), weight 86.3 kg (190 lb 3.2 oz),  SpO2 96 %.  PHYSICAL EXAMINATION:   GENERAL:  66 y.o.-year-old patient lying in the bed in moderate respiratory distress, tachypneic, uncomfortable, has accessory muscle retractions.  EYES: Pupils equal, round, reactive to light and accommodation. No scleral icterus. Extraocular muscles intact.  HEENT: Head atraumatic, normocephalic. Oropharynx and nasopharynx clear.  NECK:  Supple, no jugular venous distention. No thyroid enlargement, no tenderness.  LUNGS: Diminished breath sounds bilaterally, bilateral diffuse wheezing, no rales,rhonchi or crepitations. Using accessory muscles of respiration, uncomfortable, dyspneic.  CARDIOVASCULAR: S1, S2 normal. No murmurs, rubs, or gallops.  ABDOMEN: Soft, nontender, nondistended. Bowel sounds present. No organomegaly or mass.  EXTREMITIES: No pedal edema, cyanosis, or clubbing.  NEUROLOGIC: Cranial nerves II through XII are intact. Muscle strength 5/5 in all extremities. Sensation intact. Gait not checked.  PSYCHIATRIC: The patient is alert and oriented x 3.  SKIN: No obvious rash, lesion, or ulcer.   ORDERS/RESULTS REVIEWED:   CBC  Recent Labs Lab 10/15/16 1534 10/16/16 0355 10/17/16 0440  WBC 9.7 7.2 15.9*  HGB 13.8 14.1 13.2  HCT 40.5 40.1 38.3*  PLT 312 303 328  MCV 86.5 86.3 85.4  MCH 29.3 30.3 29.3  MCHC 33.9 35.1 34.3  RDW 14.3 14.3 14.4   ------------------------------------------------------------------------------------------------------------------  Chemistries   Recent Labs Lab 10/15/16 1534 10/16/16 0355 10/17/16 0440  NA 128* 130* 128*  K 4.3 4.5 4.7  CL 94* 94* 94*  CO2 25 27 27   GLUCOSE 128* 142* 180*  BUN 14 13 21*  CREATININE 1.02 0.66 0.59*  CALCIUM 9.0 9.0 8.7*  MG  --   --  2.0  AST  --  25  --   ALT  --  30  --   ALKPHOS  --  133*  --   BILITOT  --  0.7  --    ------------------------------------------------------------------------------------------------------------------ estimated creatinine  clearance is 102.6 mL/min (A) (by C-G formula based on SCr of 0.59 mg/dL (L)). ------------------------------------------------------------------------------------------------------------------ No results for input(s): TSH, T4TOTAL, T3FREE, THYROIDAB in the last 72 hours.  Invalid input(s): FREET3  Cardiac Enzymes  Recent Labs Lab 10/15/16 1534  TROPONINI <0.03   ------------------------------------------------------------------------------------------------------------------ Invalid input(s): POCBNP ---------------------------------------------------------------------------------------------------------------  RADIOLOGY: Dg Chest 2 View  Result Date: 10/15/2016 CLINICAL DATA:  Shortness breath.  Chest pain.  Productive cough. EXAM: CHEST  2 VIEW COMPARISON:  Two-view chest x-rays 08/04/2015 FINDINGS: The heart size is normal. Atherosclerotic calcifications are present at the aortic arch. Chronic interstitial coarsening is similar the prior study. No focal airspace consolidation is present. There is no edema or effusion. Emphysematous changes are again noted. IMPRESSION: 1. No acute cardiopulmonary disease or significant interval change. 2. Emphysema. 3. Aortic atherosclerosis. Electronically Signed   By: San Morelle M.D.   On: 10/15/2016 16:41    EKG:  Orders placed or performed during the hospital encounter of 10/15/16  . EKG 12-Lead  . EKG 12-Lead  . ED EKG within 10 minutes  . ED EKG within 10 minutes    ASSESSMENT AND PLAN:  Principal Problem:   Acute respiratory failure (HCC) Active Problems:   COPD exacerbation (HCC)   Acute bronchitis   Tobacco dependency  #1 Acute on chronic respiratory failure with hypoxia, continue oxygen therapy at 5 L, weaning down to 2 L, patient's baseline as tolerated, BiPAP at night #2. COPD exacerbation, patient was seen by intensivist, Dr. Ashby Dawes, recommended to continue bronchodilator and nebulized steroid therapy, IV  steroids, antibiotics, following closely clinically, now on BiPAP at night. Discussed with care management. The patient will likely need to stay in the hospital for the next 24-48 hours due to significant residual symptoms #3. Acute bronchitis, get sputum cultures if possible, continue Zithromax #4. Essential hypertension, satisfactory controled on current medications, continue Cardizem, HCTZ, lisinopril #5. Tobacco abuse. Counseling, discussed with patient for 4 minutes, nicotine replacement therapy is initiated, patient is agreeable, he is trying to quit Management plans discussed with the patient, family and they are in agreement.   DRUG ALLERGIES:  Allergies  Allergen Reactions  . Penicillins Anaphylaxis, Swelling and Other (See Comments)    Has patient had a PCN reaction causing immediate rash, facial/tongue/throat swelling, SOB or lightheadedness with hypotension: Yes Has patient had a PCN reaction causing severe rash involving mucus membranes or skin necrosis: No Has patient had a PCN reaction that required hospitalization No Has patient had a PCN reaction occurring within the last 10 years: No If all of the above answers are "NO", then may proceed with Cephalosporin use.    CODE STATUS:     Code Status Orders        Start     Ordered   10/15/16 2012  Full code  Continuous     10/15/16 2011    Code Status History    Date Active Date Inactive Code Status Order ID Comments User Context   07/28/2015  2:57 PM 08/06/2015  8:06 PM Full Code [id number]  Gladstone Lighter, MD Inpatient      TOTAL TIME TAKING CARE OF THIS PATIENT: 30 minutes.    Theodoro Grist M.D on 10/17/2016 at 2:13 PM  Between 7am to 6pm - Pager - [phone]  After 6pm go to [email or web address] - password [email or web address]  Office  [phone]  CC: Primary care physician; Herminio Commons, MD

## 2016-10-17 NOTE — Progress Notes (Signed)
Patient stated he did not want to wear the BiPap. Patient is on 5L  O2 via nasal canula humidified.

## 2016-10-18 LAB — BASIC METABOLIC PANEL
Anion gap: 6 (ref 5–15)
BUN: 18 mg/dL (ref 6–20)
CHLORIDE: 93 mmol/L — AB (ref 101–111)
CO2: 29 mmol/L (ref 22–32)
CREATININE: 0.56 mg/dL — AB (ref 0.61–1.24)
Calcium: 8.7 mg/dL — ABNORMAL LOW (ref 8.9–10.3)
GFR calc non Af Amer: >60 mL/min (ref >60)
Glucose, Bld: 135 mg/dL — ABNORMAL HIGH (ref 65–99)
Potassium: 4.5 mmol/L (ref 3.5–5.1)
Sodium: 128 mmol/L — ABNORMAL LOW (ref 135–145)

## 2016-10-18 LAB — GLUCOSE, CAPILLARY: GLUCOSE-CAPILLARY: 147 mg/dL — AB (ref 65–99)

## 2016-10-18 LAB — OSMOLALITY, URINE: Osmolality, Ur: 523 mOsm/kg (ref 300–900)

## 2016-10-18 MED ORDER — SENNA 8.6 MG PO TABS
1.0000 | ORAL_TABLET | Freq: Every day | ORAL | Status: DC
  Administered 2016-10-18 – 2016-10-19 (×2): 8.6 mg via ORAL
  Filled 2016-10-18 (×2): qty 1

## 2016-10-18 MED ORDER — FUROSEMIDE 10 MG/ML IJ SOLN
20.0000 mg | Freq: Once | INTRAMUSCULAR | Status: AC
  Administered 2016-10-18: 08:00:00 20 mg via INTRAVENOUS
  Filled 2016-10-18: qty 2

## 2016-10-18 NOTE — Care Management Important Message (Signed)
Important Message  Patient Details  Name: Coreyon Nicotra MRN: [id number] Date of Birth: [birth year]   Medicare Important Message Given:  Yes    Shelbie Ammons, RN 10/18/2016, 8:34 AM

## 2016-10-18 NOTE — Care Management Note (Signed)
Case Management Note  Patient Details  Name: Jeremiah [name] MRN: [id number] Date of Birth: 20-Dec-1950  Subjective/Objective:    Acute respiratory failure. Lives with girlfriend, [name], [phone]). Last seen Dr. Gwynneth Aliment May 18th. Prescriptions are filled at [facility].  No Home Health. No skilled facility. Home oxygen per [facility] x 1 month. Continuous 2 liters per nasal cannula. Nebulizer and concentrator in the home. No other equipment. Takes care of all basic activities of daily living himself, drives. Last fall was many years ago. Appetite comes and goes                Action/Plan:   Uses Bi-Pap at night in hospital. Oxygen 5 liters, wean as needed.   Expected Discharge Date:                  Expected Discharge Plan:     In-House Referral:     Discharge planning Services     Post Acute Care Choice:    Choice offered to:     DME Arranged:    DME Agency:     HH Arranged:    HH Agency:     Status of Service:     If discussed at [employer], dates discussed:    Additional Comments: Continue to assess for needs  Shelbie Ammons, RN MSN [facility] [phone] 10/18/2016, 9:02 AM

## 2016-10-18 NOTE — Progress Notes (Signed)
[facility] [place] NAME: Jeremiah [name]    MR#:  [id number]  DATE OF BIRTH:  1950/07/14  SUBJECTIVE:  CHIEF COMPLAINT:   Chief Complaint  Patient presents with  . Chest Pain  . Shortness of Breath  The patient is 66 year old Caucasian male with history of ongoing tobacco abuse, COPD, chronic respiratory failure, on 2 L of oxygen at home, hypertension, who presents to the hospital with 4-5 day history of progressive shortness of breath and cough. In emergency room, he was noted to be profoundly hypoxic, he was initiated on BiPAP and admitted to the intensive care unit. He was now weaned off BiPAP, now on 6 L of oxygen, complains of some shortness of breath, wheezing, chest tightness. Troponin was found to be normal. Chest x-ray revealed no pneumonia. The patient feels satisfactory today, continues to wheeze and have shortness of breath intermittently, Now on 3 L of oxygen through nasal cannula   Review of Systems  Constitutional: Negative for chills, fever and weight loss.  HENT: Negative for congestion.   Eyes: Negative for blurred vision and double vision.  Respiratory: Positive for cough, sputum production, shortness of breath and wheezing.   Cardiovascular: Negative for chest pain, palpitations, orthopnea, leg swelling and PND.  Gastrointestinal: Negative for abdominal pain, blood in stool, constipation, diarrhea, nausea and vomiting.  Genitourinary: Negative for dysuria, frequency, hematuria and urgency.  Musculoskeletal: Negative for falls.  Neurological: Negative for dizziness, tremors, focal weakness and headaches.  Endo/Heme/Allergies: Does not bruise/bleed easily.  Psychiatric/Behavioral: Negative for depression. The patient does not have insomnia.     VITAL SIGNS: Blood pressure 131/62, pulse 91, temperature 97.8 F (36.6 C), temperature source Oral, resp. rate (!) 24, height 6\' 1"  (1.854 m), weight 87 kg (191 lb 12.8 oz),  SpO2 94 %.  PHYSICAL EXAMINATION:   GENERAL:  66 y.o.-year-old patient lying in the bed in mild respiratory distress.  EYES: Pupils equal, round, reactive to light and accommodation. No scleral icterus. Extraocular muscles intact.  HEENT: Head atraumatic, normocephalic. Oropharynx and nasopharynx clear.  NECK:  Supple, no jugular venous distention. No thyroid enlargement, no tenderness.  LUNGS: Diminished breath sounds bilaterally,  wheezing has subsided, patient however continues to have prolonged expiratory phase, no rales,rhonchi or crepitations. Using accessory muscles of respiration intermittently.  CARDIOVASCULAR: S1, S2 normal. No murmurs, rubs, or gallops.  ABDOMEN: Soft, nontender, nondistended. Bowel sounds present. No organomegaly or mass.  EXTREMITIES: No pedal edema, cyanosis, or clubbing.  NEUROLOGIC: Cranial nerves II through XII are intact. Muscle strength 5/5 in all extremities. Sensation intact. Gait not checked.  PSYCHIATRIC: The patient is alert and oriented x 3.  SKIN: No obvious rash, lesion, or ulcer.   ORDERS/RESULTS REVIEWED:   CBC  Recent Labs Lab 10/15/16 1534 10/16/16 0355 10/17/16 0440  WBC 9.7 7.2 15.9*  HGB 13.8 14.1 13.2  HCT 40.5 40.1 38.3*  PLT 312 303 328  MCV 86.5 86.3 85.4  MCH 29.3 30.3 29.3  MCHC 33.9 35.1 34.3  RDW 14.3 14.3 14.4   ------------------------------------------------------------------------------------------------------------------  Chemistries   Recent Labs Lab 10/15/16 1534 10/16/16 0355 10/17/16 0440 10/18/16 0551  NA 128* 130* 128* 128*  K 4.3 4.5 4.7 4.5  CL 94* 94* 94* 93*  CO2 25 27 27 29   GLUCOSE 128* 142* 180* 135*  BUN 14 13 21* 18  CREATININE 1.02 0.66 0.59* 0.56*  CALCIUM 9.0 9.0 8.7* 8.7*  MG  --   --  2.0  --  AST  --  25  --   --   ALT  --  30  --   --   ALKPHOS  --  133*  --   --   BILITOT  --  0.7  --   --     ------------------------------------------------------------------------------------------------------------------ estimated creatinine clearance is 102.6 mL/min (A) (by C-G formula based on SCr of 0.56 mg/dL (L)). ------------------------------------------------------------------------------------------------------------------ No results for input(s): TSH, T4TOTAL, T3FREE, THYROIDAB in the last 72 hours.  Invalid input(s): FREET3  Cardiac Enzymes  Recent Labs Lab 10/15/16 1534  TROPONINI <0.03   ------------------------------------------------------------------------------------------------------------------ Invalid input(s): POCBNP ---------------------------------------------------------------------------------------------------------------  RADIOLOGY: No results found.  EKG:  Orders placed or performed during the hospital encounter of 10/15/16  . EKG 12-Lead  . EKG 12-Lead  . ED EKG within 10 minutes  . ED EKG within 10 minutes    ASSESSMENT AND PLAN:  Principal Problem:   Acute respiratory failure (HCC) Active Problems:   COPD exacerbation (HCC)   Acute bronchitis   Tobacco dependency  #1 Acute on chronic respiratory failure with hypoxia, continue oxygen therapy at 3 L, weaning down to 2 L, patient's baseline as tolerated, BiPAP at night #2. COPD exacerbation, patient was seen by intensivist, Dr. Ashby Dawes, recommended to continue bronchodilator and nebulized steroid therapy, IV steroids, antibiotics, following closely clinically, BiPAP at night. Improving clinically, possible discharge within 1-2 days #3. Acute bronchitis, unable to get sputum cultures , continue Zithromax, clinically improved #4. Essential hypertension, satisfactory controled on current medications, continue Cardizem, HCTZ, lisinopril #5. Tobacco abuse. Counseling, discussed with patient for 4 minutes, nicotine replacement therapy is to be continued, patient is attempting to quit #6.  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   DRUG ALLERGIES:  Allergies  Allergen Reactions  . Penicillins Anaphylaxis, Swelling and Other (See Comments)    Has patient had a PCN reaction causing immediate rash, facial/tongue/throat swelling, SOB or lightheadedness with hypotension: Yes Has patient had a PCN reaction causing severe rash involving mucus membranes or skin necrosis: No Has patient had a PCN reaction that required hospitalization No Has patient had a PCN reaction occurring within the last 10 years: No If all of the above answers are "NO", then may proceed with Cephalosporin use.    CODE STATUS:     Code Status Orders        Start     Ordered   10/15/16 2012  Full code  Continuous     10/15/16 2011    Code Status History    Date Active Date Inactive Code Status Order ID Comments User Context   07/28/2015  2:57 PM 08/06/2015  8:06 PM Full Code [id number]  Gladstone Lighter, MD Inpatient      TOTAL TIME TAKING CARE OF THIS PATIENT: 30 minutes.    Theodoro Grist M.D on 10/18/2016 at 1:11 PM  Between 7am to 6pm - Pager - [phone]  After 6pm go to [email or web address] - password [email or web address] [facility]  Office  [phone]  CC: Primary care physician; Herminio Commons, MD

## 2016-10-19 LAB — GLUCOSE, CAPILLARY: GLUCOSE-CAPILLARY: 120 mg/dL — AB (ref 65–99)

## 2016-10-19 LAB — PROCALCITONIN: Procalcitonin: <0.1 ng/mL

## 2016-10-19 LAB — SODIUM: Sodium: 128 mmol/L — ABNORMAL LOW (ref 135–145)

## 2016-10-19 MED ORDER — BUDESONIDE 0.5 MG/2ML IN SUSP
0.5000 mg | Freq: Two times a day (BID) | RESPIRATORY_TRACT | Status: DC
  Administered 2016-10-19 – 2016-10-20 (×3): 0.5 mg via RESPIRATORY_TRACT
  Filled 2016-10-19 (×2): qty 2

## 2016-10-19 MED ORDER — MOMETASONE FURO-FORMOTEROL FUM 200-5 MCG/ACT IN AERO
2.0000 | INHALATION_SPRAY | Freq: Two times a day (BID) | RESPIRATORY_TRACT | Status: DC
  Administered 2016-10-19 – 2016-10-20 (×3): 2 via RESPIRATORY_TRACT
  Filled 2016-10-19: qty 8.8

## 2016-10-19 NOTE — Plan of Care (Deleted)
Problem: Bowel/Gastric: Goal: Will not experience complications related to bowel motility Outcome: Progressing BM this AM

## 2016-10-19 NOTE — NC FL2 (Signed)
[place] LEVEL OF CARE SCREENING TOOL     IDENTIFICATION  Patient Name: Jeremiah [name] Birthdate: May 27, 1950 Sex: male Admission Date (Current Location): 10/15/2016  [place] and [place] Number:  Engineering geologist and Address:  [facility], [address], [place], [place] [address]      Provider Number: [id number]  Attending Physician Name and Address:  Vaughan Basta, *  Relative Name and Phone Number:       Current Level of Care: Hospital Recommended Level of Care: [place] Prior Approval Number:    Date Approved/Denied:   PASRR Number:  [id number] )  Discharge Plan: SNF    Current Diagnoses: Patient Active Problem List   Diagnosis Date Noted  . Acute respiratory failure ([place]) 10/15/2016  . Acute bronchitis 10/15/2016  . Tobacco dependency 10/15/2016  . COPD exacerbation ([place]) 07/28/2015    Orientation RESPIRATION BLADDER Height & Weight     Self, Situation, Place  O2 (2 Liters Oxygen ) Continent Weight: 191 lb 12.8 oz (87 kg) Height:  6\' 1"  (185.4 cm)  BEHAVIORAL SYMPTOMS/MOOD NEUROLOGICAL BOWEL NUTRITION STATUS   (none)  (none) Continent Diet (Diet: Heart Healthy )  AMBULATORY STATUS COMMUNICATION OF NEEDS Skin   Extensive Assist Verbally Normal                       Personal Care Assistance Level of Assistance  Bathing, Feeding, Dressing Bathing Assistance: Limited assistance Feeding assistance: Independent Dressing Assistance: Limited assistance     Functional Limitations Info  Sight, Hearing, Speech Sight Info: Adequate Hearing Info: Adequate Speech Info: Adequate    SPECIAL CARE FACTORS FREQUENCY  PT (By licensed PT), OT (By licensed OT)     PT Frequency:  (5) OT Frequency:  (5)            Contractures      Additional Factors Info  Code Status, Allergies Code Status Info:  (Full Code. ) Allergies Info:  (Penicillins)           Current Medications  (10/19/2016):  This is the current hospital active medication list Current Facility-Administered Medications  Medication Dose Route Frequency Provider Last Rate Last Dose  . acetaminophen (TYLENOL) tablet 650 mg  650 mg Oral Q6H PRN Idelle Crouch, MD   650 mg at 10/17/16 1440   Or  . acetaminophen (TYLENOL) suppository 650 mg  650 mg Rectal Q6H PRN Idelle Crouch, MD      . azithromycin (ZITHROMAX) tablet 500 mg  500 mg Oral Q24H Theodoro Grist, MD   500 mg at 10/18/16 1755  . bisacodyl (DULCOLAX) suppository 10 mg  10 mg Rectal Daily PRN Idelle Crouch, MD      . budesonide (PULMICORT) nebulizer solution 0.5 mg  0.5 mg Nebulization BID Vaughan Basta, MD   0.5 mg at 10/19/16 0809  . chlorhexidine (PERIDEX) 0.12 % solution 15 mL  15 mL Mouth Rinse BID Idelle Crouch, MD   15 mL at 10/19/16 0952  . diltiazem (CARDIZEM) tablet 120 mg  120 mg Oral QID Idelle Crouch, MD   120 mg at 10/19/16 1053  . docusate sodium (COLACE) capsule 100 mg  100 mg Oral BID Idelle Crouch, MD   100 mg at 10/19/16 8144  . enoxaparin (LOVENOX) injection 40 mg  40 mg Subcutaneous Q24H Idelle Crouch, MD   40 mg at 10/18/16 2103  . famotidine (PEPCID) tablet 20 mg  20 mg  Oral BID Theodoro Grist, MD   20 mg at 10/19/16 6759  . hydrochlorothiazide (HYDRODIURIL) tablet 25 mg  25 mg Oral Daily Idelle Crouch, MD   25 mg at 10/19/16 1638  . ipratropium-albuterol (DUONEB) 0.5-2.5 (3) MG/3ML nebulizer solution 3 mL  3 mL Nebulization Q4H Awilda Bill, NP   3 mL at 10/19/16 1130  . ipratropium-albuterol (DUONEB) 0.5-2.5 (3) MG/3ML nebulizer solution 3 mL  3 mL Nebulization Q2H PRN Theodoro Grist, MD      . lisinopril (PRINIVIL,ZESTRIL) tablet 10 mg  10 mg Oral Daily Idelle Crouch, MD   10 mg at 10/19/16 4665  . LORazepam (ATIVAN) injection 0.5 mg  0.5 mg Intravenous Q4H PRN Idelle Crouch, MD   0.5 mg at 10/18/16 2132  . MEDLINE mouth rinse  15 mL Mouth Rinse q12n4p Idelle Crouch, MD    15 mL at 10/18/16 1628  . methylPREDNISolone sodium succinate (SOLU-MEDROL) 40 mg/mL injection 40 mg  40 mg Intravenous Q12H Awilda Bill, NP   40 mg at 10/19/16 0458  . mometasone-formoterol (DULERA) 200-5 MCG/ACT inhaler 2 puff  2 puff Inhalation BID Vaughan Basta, MD   2 puff at 10/19/16 [phone]  . nicotine (NICODERM CQ - dosed in mg/24 hours) patch 21 mg  21 mg Transdermal Daily Idelle Crouch, MD   21 mg at 10/19/16 0954  . ondansetron (ZOFRAN) tablet 4 mg  4 mg Oral Q6H PRN Idelle Crouch, MD       Or  . ondansetron [facility]) injection 4 mg  4 mg Intravenous Q6H PRN Idelle Crouch, MD   4 mg at 10/16/16 0723  . oxyCODONE-acetaminophen (PERCOCET/ROXICET) 5-325 MG per tablet 1 tablet  1 tablet Oral Q6H PRN Hillary Bow, MD   1 tablet at 10/19/16 0950  . senna (SENOKOT) tablet 8.6 mg  1 tablet Oral QHS Theodoro Grist, MD   8.6 mg at 10/18/16 2103  . sertraline (ZOLOFT) tablet 25 mg  25 mg Oral BID Idelle Crouch, MD   25 mg at 10/19/16 7017     Discharge Medications: Please see discharge summary for a list of discharge medications.  Relevant Imaging Results:  Relevant Lab Results:   Additional Information  (SSN: [id number])  Sample, [name], LCSW

## 2016-10-19 NOTE — Clinical Social Work Note (Signed)
Clinical Social Work Assessment  Patient Details  Name: Jeremiah [name] MRN: [id number] Date of Birth: 1950/07/21  Date of referral:  10/19/16               Reason for consult:  Facility Placement                Permission sought to share information with:  Chartered certified accountant granted to share information::  Yes, Verbal Permission Granted  Name::      Jerem[facility]::   [name]   Relationship::     Contact Information:     Housing/Transportation Living arrangements for the past 2 months:  [place] of Information:  Patient Patient Interpreter Needed:  None Criminal Activity/Legal Involvement Pertinent to Current Situation/Hospitalization:  No - Comment as needed Significant Relationships:  Friend Lives with:  Friends Do you feel safe going back to the place where you live?  Yes Need for family participation in patient care:  Yes (Comment)  Care giving concerns:  Patient lives with a friend Jeremiah [name] in [place], [place].    Social Worker assessment / plan:  Holiday representative (CSW) reviewed chart and noted that PT is recommending SNF. CSW met with patient alone at bedside to discuss D/C plan. Patient was alert and oriented X4 and was laying in the bed. CSW introduced self and explained role of CSW department. Patient reported that he lives with a friend in [place]. CSW explained SNF process and that medicare requires a 3 night qualifying inpatient stay at a hospital in order to pay for SNF. Patient was admitted to inpatient on 10/15/16. Patient is agreeable to SNF search in [facility] and prefers [name]. FL2 complete and faxed out.   CSW presented bed offers and patient chose [name]. [name] liaison is aware of accepted bed offer. Patient can D/C to [name] when medically stable. CSW will continue to follow and assist as needed.    Employment status:  Retired Forensic scientist:  Medicare PT  Recommendations:  [place] / Referral to community resources:  [place]  Patient/Family's Response to care:  Patient is agreeable to D/C to [name] for rehab.   Patient/Family's Understanding of and Emotional Response to Diagnosis, Current Treatment, and Prognosis:  Patient was very pleasant and thanked CSW for assistance.   Emotional Assessment Appearance:  Appears stated age Attitude/Demeanor/Rapport:    Affect (typically observed):  Accepting, Adaptable, Pleasant Orientation:  Oriented to Self, Oriented to Place, Oriented to  Time, Oriented to Situation Alcohol / Substance use:  Not Applicable Psych involvement (Current and /or in the community):  No (Comment)  Discharge Needs  Concerns to be addressed:  Discharge Planning Concerns Readmission within the last 30 days:  No Current discharge risk:  Dependent with Mobility Barriers to Discharge:  Continued Medical Work up   [employer], [name], LCSW 10/19/2016, 4:12 PM

## 2016-10-19 NOTE — Clinical Social Work Placement (Signed)
   CLINICAL SOCIAL WORK PLACEMENT  NOTE  Date:  10/19/2016  Patient Details  Name: Jeremiah [name] MRN: [id number] Date of Birth: 17-Sep-1950  Clinical Social Work is seeking post-discharge placement for this patient at the [place] level of care (*CSW will initial, date and re-position this form in  chart as items are completed):  Yes   Patient/family provided with [place] Work Department's list of facilities offering this level of care within the geographic area requested by the patient (or if unable, by the patient's family).  Yes   Patient/family informed of their freedom to choose among providers that offer the needed level of care, that participate in Medicare, Medicaid or managed care program needed by the patient, have an available bed and are willing to accept the patient.  Yes   Patient/family informed of 's ownership interest in [facility] and [facility], as well as of the fact that they are under no obligation to receive care at these facilities.  PASRR submitted to EDS on       PASRR number received on       Existing PASRR number confirmed on 10/19/16     FL2 transmitted to all facilities in geographic area requested by pt/family on 10/19/16     FL2 transmitted to all facilities within larger geographic area on       Patient informed that his/her managed care company has contracts with or will negotiate with certain facilities, including the following:        Yes   Patient/family informed of bed offers received.  Patient chooses bed at  ([facility] )     Physician recommends and patient chooses bed at      Patient to be transferred to   on  .  Patient to be transferred to facility by       Patient family notified on   of transfer.  Name of family member notified:        PHYSICIAN       Additional Comment:    _______________________________________________ Shemicka Cohrs, [name], LCSW 10/19/2016, 4:10  PM

## 2016-10-19 NOTE — Progress Notes (Signed)
[facility] [place] NAME: [name]    MR#:  [id number]  DATE OF BIRTH:  Jun 21, 1950  SUBJECTIVE:  CHIEF COMPLAINT:   Chief Complaint  Patient presents with  . Chest Pain  . Shortness of Breath  The patient is 66 year old Caucasian male with history of ongoing tobacco abuse, COPD, chronic respiratory failure, on 2 L of oxygen at home, hypertension, who presents to the hospital with 4-5 day history of progressive shortness of breath and cough. In emergency room, he was noted to be profoundly hypoxic, he was initiated on BiPAP and admitted to the intensive care unit. He was now weaned off BiPAP, now on 6 L of oxygen, complains of some shortness of breath, wheezing, chest tightness. Troponin was found to be normal. Chest x-ray revealed no pneumonia. The patient feels satisfactory today, continues to wheeze and have shortness of breath intermittently, Now on 3 L of oxygen through nasal cannula As per nurse, he gets very SOB just by standing up at bedside. Will get PT.   Review of Systems  Constitutional: Negative for chills, fever and weight loss.  HENT: Negative for congestion.   Eyes: Negative for blurred vision and double vision.  Respiratory: Positive for cough, sputum production, shortness of breath and wheezing.   Cardiovascular: Negative for chest pain, palpitations, orthopnea, leg swelling and PND.  Gastrointestinal: Negative for abdominal pain, blood in stool, constipation, diarrhea, nausea and vomiting.  Genitourinary: Negative for dysuria, frequency, hematuria and urgency.  Musculoskeletal: Negative for falls.  Neurological: Negative for dizziness, tremors, focal weakness and headaches.  Endo/Heme/Allergies: Does not bruise/bleed easily.  Psychiatric/Behavioral: Negative for depression. The patient does not have insomnia.     VITAL SIGNS: Blood pressure (!) 116/59, pulse 70, temperature 98.1 F (36.7 C), temperature  source Oral, resp. rate (!) 22, height 6\' 1"  (1.854 m), weight 87 kg (191 lb 12.8 oz), SpO2 95 %.  PHYSICAL EXAMINATION:   GENERAL:  66 y.o.-year-old patient lying in the bed in mild respiratory distress.  EYES: Pupils equal, round, reactive to light and accommodation. No scleral icterus. Extraocular muscles intact.  HEENT: Head atraumatic, normocephalic. Oropharynx and nasopharynx clear.  NECK:  Supple, no jugular venous distention. No thyroid enlargement, no tenderness.  LUNGS: Diminished breath sounds bilaterally,  wheezing present, patient however continues to have prolonged expiratory phase, no rales,rhonchi or crepitations. Using accessory muscles of respiration intermittently.  CARDIOVASCULAR: S1, S2 normal. No murmurs, rubs, or gallops.  ABDOMEN: Soft, nontender, nondistended. Bowel sounds present. No organomegaly or mass.  EXTREMITIES: No pedal edema, cyanosis, or clubbing.  NEUROLOGIC: Cranial nerves II through XII are intact. Muscle strength 5/5 in all extremities. Sensation intact. Gait not checked.  PSYCHIATRIC: The patient is alert and oriented x 3.  SKIN: No obvious rash, lesion, or ulcer.   ORDERS/RESULTS REVIEWED:   CBC  Recent Labs Lab 10/15/16 1534 10/16/16 0355 10/17/16 0440  WBC 9.7 7.2 15.9*  HGB 13.8 14.1 13.2  HCT 40.5 40.1 38.3*  PLT 312 303 328  MCV 86.5 86.3 85.4  MCH 29.3 30.3 29.3  MCHC 33.9 35.1 34.3  RDW 14.3 14.3 14.4   ------------------------------------------------------------------------------------------------------------------  Chemistries   Recent Labs Lab 10/15/16 1534 10/16/16 0355 10/17/16 0440 10/18/16 0551 10/19/16 0453  NA 128* 130* 128* 128* 128*  K 4.3 4.5 4.7 4.5  --   CL 94* 94* 94* 93*  --   CO2 25 27 27 29   --   GLUCOSE 128* 142* 180*  135*  --   BUN 14 13 21* 18  --   CREATININE 1.02 0.66 0.59* 0.56*  --   CALCIUM 9.0 9.0 8.7* 8.7*  --   MG  --   --  2.0  --   --   AST  --  25  --   --   --   ALT  --  30  --    --   --   ALKPHOS  --  133*  --   --   --   BILITOT  --  0.7  --   --   --    ------------------------------------------------------------------------------------------------------------------ estimated creatinine clearance is 102.6 mL/min (A) (by C-G formula based on SCr of 0.56 mg/dL (L)). ------------------------------------------------------------------------------------------------------------------ No results for input(s): TSH, T4TOTAL, T3FREE, THYROIDAB in the last 72 hours.  Invalid input(s): FREET3  Cardiac Enzymes  Recent Labs Lab 10/15/16 1534  TROPONINI <0.03   ------------------------------------------------------------------------------------------------------------------ Invalid input(s): POCBNP ---------------------------------------------------------------------------------------------------------------  RADIOLOGY: No results found.  EKG:  Orders placed or performed during the hospital encounter of 10/15/16  . EKG 12-Lead  . EKG 12-Lead  . ED EKG within 10 minutes  . ED EKG within 10 minutes    ASSESSMENT AND PLAN:  Principal Problem:   Acute respiratory failure (HCC) Active Problems:   COPD exacerbation (HCC)   Acute bronchitis   Tobacco dependency  #1 Acute on chronic respiratory failure with hypoxia, continue oxygen therapy at 3 L, weaning down to 2 L, patient's baseline as tolerated, BiPAP at night #2. COPD exacerbation, patient was seen by intensivist, Dr. Ashby Dawes, recommended to continue bronchodilator and nebulized steroid therapy ( increased budesonide dose today), IV steroids, antibiotics, following closely clinically, BiPAP at night. Improving clinically,   can no add ceftriaxone as he is allergic. #3. Acute bronchitis, unable to get sputum cultures , continue Zithromax, clinically improved, still have significant wheezing and SOB on standing up, will get PT eval. #4. Essential hypertension, satisfactory controled on current medications,  continue Cardizem, HCTZ, lisinopril #5. Tobacco abuse. Counseling, discussed with patient for 4 minutes, nicotine replacement therapy is to be continued, patient is attempting to quit #6. 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   DRUG ALLERGIES:  Allergies  Allergen Reactions  . Penicillins Anaphylaxis, Swelling and Other (See Comments)    Has patient had a PCN reaction causing immediate rash, facial/tongue/throat swelling, SOB or lightheadedness with hypotension: Yes Has patient had a PCN reaction causing severe rash involving mucus membranes or skin necrosis: No Has patient had a PCN reaction that required hospitalization No Has patient had a PCN reaction occurring within the last 10 years: No If all of the above answers are "NO", then may proceed with Cephalosporin use.    CODE STATUS:     Code Status Orders        Start     Ordered   10/15/16 2012  Full code  Continuous     10/15/16 2011    Code Status History    Date Active Date Inactive Code Status Order ID Comments User Context   07/28/2015  2:57 PM 08/06/2015  8:06 PM Full Code [id number]  Gladstone Lighter, MD Inpatient      TOTAL TIME TAKING CARE OF THIS PATIENT: 30 minutes.    Vaughan Basta M.D on 10/19/2016 at 7:52 AM  Between 7am to 6pm - Pager - [phone]  After 6pm go to [email or web address] - password [email or web address] [facility]  Office  [phone]  CC: Primary care physician; Herminio Commons, MD

## 2016-10-19 NOTE — Evaluation (Signed)
Physical Therapy Evaluation Patient Details Name: [facility]iah [name] MRN: [id number] DOB: Apr 15, 1951 Today's Date: 10/19/2016   History of Present Illness  Je[name] is a 66 y.o. male has a past medical history significant for COPD and HTN who presents to ER with 4-5 day hx of progressive SOB with cough. Pt is profoundly hypoxic with CO2 retention requiring BiPAP. He is now admitted. No fever. Denies CP. Still smoking  Clinical Impression  Pt admitted with above diagnosis. Pt currently with functional limitations due to the deficits listed below (see PT Problem List).  Pt is modified independent for bed mobility and CGA for transfers and very limited ambulation. SaO2 drops with simple bed mobility. Pt placed on 4L/min O2 for ambulation. He is able to ambulate around end of bed with therapist. Gait speed is slow and pt is tremulous. Pt becomes very short of breath. He starts to complain of dizziness so returned to sitting on EOB. SaO2 at 86% and requires increased time on 4L/min with pursed lip breathing to recover to 91%. Pt with tripod positioning, accessory muscle use, and wheezing. He is unable to ambulate farther at this time due to DOE. Pt is unsafe to return home at this time and will require SNF placement. Will continue to follow and update DC recommendations as appropriate. Pt will benefit from skilled PT services to address deficits in strength, balance, and mobility in order to return to full function at home.     Follow Up Recommendations SNF    Equipment Recommendations  None recommended by PT    Recommendations for Other Services       Precautions / Restrictions Precautions Precautions: Fall Restrictions Weight Bearing Restrictions: No      Mobility  Bed Mobility Overal bed mobility: Modified Independent             General bed mobility comments: Increased time required but pt able to perform. Pt on 2L/min O2 and after bed mobility SaO2 drops to 86%.  Requires extended time to recover  Transfers Overall transfer level: Needs assistance Equipment used: Rolling walker (2 wheeled) Transfers: Sit to/from Stand Sit to Stand: Min guard         General transfer comment: Pt requiring increased time to come to standing. Once upright he is steady with UE support on rolling walker  Ambulation/Gait Ambulation/Gait assistance: Min guard Ambulation Distance (Feet): 8 Feet Assistive device: Rolling walker (2 wheeled) Gait Pattern/deviations: Decreased step length - right;Decreased step length - left Gait velocity: Decreased but functional for household ambulation Gait velocity interpretation: <1.8 ft/sec, indicative of risk for recurrent falls General Gait Details: Pt placed on 4L/min O2 for ambulation. He is able to ambulate around end of bed with therapist. Gait speed is slow and pt is tremulous. Pt becomes very short of breath. He starts to complain of dizziness so returned to sitting on EOB. SaO2 at 86% and requires increased time on 4L/min with pursed lip breathing to recover to 91%. Pt with tripod positioning, accessory muscle use, and wheezing. He is unable to ambulate farther at this time.   Stairs            Wheelchair Mobility    Modified Rankin (Stroke Patients Only)       Balance Overall balance assessment: Needs assistance Sitting-balance support: No upper extremity supported Sitting balance-Leahy Scale: Good     Standing balance support: No upper extremity supported Standing balance-Leahy Scale: Fair Standing balance comment: Able to maintain wide stance without UE support  Pertinent Vitals/Pain Pain Assessment: 0-10 Pain Score: 8  Pain Location: Posterior headache pain Pain Intervention(s): Monitored during session    [place] expects to be discharged to:: Private residence Living Arrangements: Non-relatives/Friends Available Help at Discharge:  Friend(s) Type of Home: House Home Access: Stairs to enter Entrance Stairs-Rails: None (Columns he can hold) Entrance Stairs-Number of Steps: 5 Home Layout: Multi-level;Able to live on main level with bedroom/bathroom Home Equipment: Walker - 2 wheels;Grab bars - tub/shower (O2 and nebulizer)      Prior Function Level of Independence: Needs assistance   Gait / Transfers Assistance Needed: Independent with ambulation without assistive device. 1 fall in the last 12 months  ADL's / Homemaking Assistance Needed: Independent with ADLs, assist with IADLs        Hand Dominance   Dominant Hand: Right    Extremity/Trunk Assessment   Upper Extremity Assessment Upper Extremity Assessment: Generalized weakness    Lower Extremity Assessment Lower Extremity Assessment: Generalized weakness       Communication   Communication: Other (comment) (SOB during conversation)  Cognition Arousal/Alertness: Awake/alert Behavior During Therapy: WFL for tasks assessed/performed Overall Cognitive Status: Within Functional Limits for tasks assessed                                        General Comments      Exercises     Assessment/Plan    PT Assessment Patient needs continued PT services  PT Problem List Decreased strength;Decreased activity tolerance;Decreased mobility;Cardiopulmonary status limiting activity       PT Treatment Interventions DME instruction;Gait training;Stair training;Therapeutic exercise;Therapeutic activities;Balance training;Patient/family education    PT Goals (Current goals can be found in the Care Plan section)  Acute Rehab PT Goals Patient Stated Goal: Return to prior function PT Goal Formulation: With patient Time For Goal Achievement: 11/02/16 Potential to Achieve Goals: Good    Frequency Min 2X/week   Barriers to discharge Decreased caregiver support Lives with friends but frequently alone    Co-evaluation                AM-PAC PT "6 Clicks" Daily Activity  Outcome Measure Difficulty turning over in bed (including adjusting bedclothes, sheets and blankets)?: None Difficulty moving from lying on back to sitting on the side of the bed? : A Little Difficulty sitting down on and standing up from a chair with arms (e.g., wheelchair, bedside commode, etc,.)?: A Little Help needed moving to and from a bed to chair (including a wheelchair)?: A Little Help needed walking in hospital room?: A Little Help needed climbing 3-5 steps with a railing? : A Lot 6 Click Score: 18    End of Session Equipment Utilized During Treatment: Gait belt;Oxygen;Other (comment) (2L/min at rest, 4L/min with ambulation) Activity Tolerance: Treatment limited secondary to medical complications (Comment) Patient left: in bed;with call bell/phone within reach;with bed alarm set   PT Visit Diagnosis: Muscle weakness (generalized) (M62.81);Difficulty in walking, not elsewhere classified (R26.2)    Time: 1610-9604 PT Time Calculation (min) (ACUTE ONLY): 22 min   Charges:   PT Evaluation $PT Eval Low Complexity: 1 Procedure     PT G Codes:   PT G-Codes **NOT FOR INPATIENT CLASS** Functional Assessment Tool Used: AM-PAC 6 Clicks Basic Mobility Functional Limitation: Mobility: Walking and moving around Mobility: Walking and Moving Around Current Status (V4098): At least 40 percent but less than 60 percent impaired, limited  or restricted Mobility: Walking and Moving Around Goal Status [phone]): At least 20 percent but less than 40 percent impaired, limited or restricted    Phillips Grout PT, DPT    Isebella Upshur 10/19/2016, 1:54 PM

## 2016-10-20 LAB — BASIC METABOLIC PANEL
ANION GAP: 7 (ref 5–15)
BUN: 34 mg/dL — ABNORMAL HIGH (ref 6–20)
CALCIUM: 9.1 mg/dL (ref 8.9–10.3)
CO2: 34 mmol/L — ABNORMAL HIGH (ref 22–32)
Chloride: 87 mmol/L — ABNORMAL LOW (ref 101–111)
Creatinine, Ser: 0.82 mg/dL (ref 0.61–1.24)
Glucose, Bld: 137 mg/dL — ABNORMAL HIGH (ref 65–99)
POTASSIUM: 5.1 mmol/L (ref 3.5–5.1)
SODIUM: 128 mmol/L — AB (ref 135–145)

## 2016-10-20 LAB — GLUCOSE, CAPILLARY: GLUCOSE-CAPILLARY: 144 mg/dL — AB (ref 65–99)

## 2016-10-20 MED ORDER — BUDESONIDE 0.5 MG/2ML IN SUSP: 0.5000 mg | Freq: Two times a day (BID) | RESPIRATORY_TRACT | 0 refills | Status: DC

## 2016-10-20 MED ORDER — DOCUSATE SODIUM 100 MG PO CAPS: 100.0000 mg | ORAL_CAPSULE | Freq: Two times a day (BID) | ORAL | 0 refills | Status: DC

## 2016-10-20 MED ORDER — GUAIFENESIN-DM 100-10 MG/5ML PO SYRP: 5.0000 mL | ORAL_SOLUTION | ORAL | 0 refills | Status: DC | PRN

## 2016-10-20 MED ORDER — PREDNISONE 10 MG (21) PO TBPK: ORAL_TABLET | ORAL | 0 refills | Status: DC

## 2016-10-20 MED ORDER — CHLORHEXIDINE GLUCONATE 0.12 % MT SOLN: 15.0000 mL | Freq: Two times a day (BID) | OROMUCOSAL | 0 refills | Status: DC

## 2016-10-20 MED ORDER — BISACODYL 5 MG PO TBEC
5.0000 mg | DELAYED_RELEASE_TABLET | Freq: Every day | ORAL | Status: DC | PRN
  Administered 2016-10-20: 07:00:00 5 mg via ORAL
  Filled 2016-10-20: qty 1

## 2016-10-20 MED ORDER — BISACODYL 5 MG PO TBEC: 5.0000 mg | DELAYED_RELEASE_TABLET | Freq: Every day | ORAL | 0 refills | Status: DC | PRN

## 2016-10-20 MED ORDER — GUAIFENESIN-DM 100-10 MG/5ML PO SYRP
5.0000 mL | ORAL_SOLUTION | ORAL | Status: DC | PRN
  Administered 2016-10-20 (×2): 5 mL via ORAL
  Filled 2016-10-20 (×2): qty 5

## 2016-10-20 NOTE — Clinical Social Work Placement (Signed)
   CLINICAL SOCIAL WORK PLACEMENT  NOTE  Date:  10/20/2016  Patient Details  Name: [name] MRN: [id number] Date of Birth: June 26, 1950  Clinical Social Work is seeking post-discharge placement for this patient at the [place] level of care (*CSW will initial, date and re-position this form in  chart as items are completed):  Yes   Patient/family provided with [place] Work Department's list of facilities offering this level of care within the geographic area requested by the patient (or if unable, by the patient's family).  Yes   Patient/family informed of their freedom to choose among providers that offer the needed level of care, that participate in Medicare, Medicaid or managed care program needed by the patient, have an available bed and are willing to accept the patient.  Yes   Patient/family informed of [facility]'s ownership interest in [facility] and [facility], as well as of the fact that they are under no obligation to receive care at these facilities.  PASRR submitted to EDS on       PASRR number received on       Existing PASRR number confirmed on 10/19/16     FL2 transmitted to all facilities in geographic area requested by pt/family on 10/19/16     FL2 transmitted to all facilities within larger geographic area on       Patient informed that his/her managed care company has contracts with or will negotiate with certain facilities, including the following:        Yes   Patient/family informed of bed offers received.  Patient chooses bed at  ([facility] )     Physician recommends and patient chooses bed at      Patient to be transferred to  ([facility] ) on 10/20/16.  Patient to be transferred to facility by  [facility] )     Patient family notified on 10/20/16 of transfer.  Name of family member notified:   (Patient's friend [name] is aware of D/C today. )     PHYSICIAN       Additional Comment:     _______________________________________________ Mahlet Jergens, [name], LCSW 10/20/2016, 10:58 AM

## 2016-10-20 NOTE — Progress Notes (Signed)
Patient is medically stable for D/C to [facility] today. Per [facility] liaison patient can come today to room 714. RN will call report to RN Yaakov Guthrie at [phone] and arrange EMS for transport. Clinical Education officer, museum (CSW) sent D/C orders to [facility] via HUB. Patient is aware of above. CSW contacted patient's friend [name] who is listed as his emergency contact and made her aware of above. Please reconsult if future social work needs arise. CSW signing off.   [employer], LCSW [phone]

## 2016-10-20 NOTE — Discharge Summary (Signed)
[place] [facility] NAME: Jeremiah [name]    MR#:  [id number]  DATE OF BIRTH:  [birth year]  DATE OF ADMISSION:  10/15/2016 ADMITTING PHYSICIAN: Idelle Crouch, MD  DATE OF DISCHARGE: 10/20/2016   PRIMARY CARE PHYSICIAN: Soles, Howell Rucks, MD    ADMISSION DIAGNOSIS:  Hypoxia [R09.02] COPD exacerbation (HCC) [J44.1]  DISCHARGE DIAGNOSIS:  Principal Problem:   Acute respiratory failure (HCC) Active Problems:   COPD exacerbation (HCC)   Acute bronchitis   Tobacco dependency   SECONDARY DIAGNOSIS:   Past Medical History:  Diagnosis Date  . Asthma   . COPD (chronic obstructive pulmonary disease) ([place])   . GERD (gastroesophageal reflux disease)   . HTN (hypertension)     HOSPITAL COURSE:   #1 Acute on chronic respiratory failure with hypoxia, continue oxygen therapy at 3 L, weaning down to 2 L, patient's baseline as tolerated, BiPAP at night #2. COPD exacerbation, patient was seen by intensivist, Dr. Ashby Dawes, recommended to continue bronchodilator and nebulized steroid therapy ( increased budesonide dose today), IV steroids, antibiotics, following closely clinically, BiPAP at night. Improving clinically,   can not add ceftriaxone as he is allergic.   Finished 5 days of azithromycin today.  Use Bipap- I/E pressure 12/5 and 2 ltr/min oxygen supply. #3. Acute bronchitis, unable to get sputum cultures , continue Zithromax, clinically improved,   finished 5 day of azithromycin, give tapering oral steroids.   PT suggested SNF. #4. Essential hypertension, satisfactory controled on current medications, continue Cardizem, HCTZ, lisinopril #5. Tobacco abuse. Counseling, discussed with patient for 4 minutes, nicotine replacement therapy is to be continued, patient is attempting to quit #6. 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  DISCHARGE CONDITIONS:   Stable.  CONSULTS OBTAINED:  Treatment Team:  Laverle Hobby, MD  DRUG ALLERGIES:   Allergies  Allergen Reactions  . Penicillins Anaphylaxis, Swelling and Other (See Comments)    Has patient had a PCN reaction causing immediate rash, facial/tongue/throat swelling, SOB or lightheadedness with hypotension: Yes Has patient had a PCN reaction causing severe rash involving mucus membranes or skin necrosis: No Has patient had a PCN reaction that required hospitalization No Has patient had a PCN reaction occurring within the last 10 years: No If all of the above answers are "NO", then may proceed with Cephalosporin use.    DISCHARGE MEDICATIONS:   Current Discharge Medication List    START taking these medications   Details  bisacodyl (DULCOLAX) 5 MG EC tablet Take 1 tablet (5 mg total) by mouth daily as needed for moderate constipation. Qty: 30 tablet, Refills: 0    budesonide (PULMICORT) 0.5 MG/2ML nebulizer solution Take 2 mLs (0.5 mg total) by nebulization 2 (two) times daily. Qty: 30 mL, Refills: 0    chlorhexidine (PERIDEX) 0.12 % solution 15 mLs by Mouth Rinse route 2 (two) times daily. Qty: 120 mL, Refills: 0    docusate sodium (COLACE) 100 MG capsule Take 1 capsule (100 mg total) by mouth 2 (two) times daily. Qty: 10 capsule, Refills: 0    guaiFENesin-dextromethorphan (ROBITUSSIN DM) 100-10 MG/5ML syrup Take 5 mLs by mouth every 4 (four) hours as needed for cough. Qty: 118 mL, Refills: 0    predniSONE (STERAPRED UNI-PAK 21 TAB) 10 MG (21) TBPK tablet Take 6 tabs first day, 5 tab on day 2, then 4 on day 3rd, 3 tabs on day 4th , 2 tab  on day 5th, and 1 tab on 6th day. Qty: 21 tablet, Refills: 0      CONTINUE these medications which have NOT CHANGED   Details  acetaminophen (TYLENOL) 325 MG tablet Take 2 tablets (650 mg total) by mouth every 6 (six) hours as needed for mild pain (or Fever >/= 101).    BREO ELLIPTA 100-25  MCG/INH AEPB Take 1 puff by mouth daily.    diltiazem (CARDIZEM) 120 MG tablet Take 1 tablet (120 mg total) by mouth 4 (four) times daily.    diphenhydrAMINE (BENADRYL) 25 mg capsule Take 1 capsule (25 mg total) by mouth every 6 (six) hours as needed for itching, allergies or sleep. Qty: 30 capsule, Refills: 0    hydrochlorothiazide (HYDRODIURIL) 25 MG tablet Take 25 mg by mouth daily.    ipratropium-albuterol (DUONEB) 0.5-2.5 (3) MG/3ML SOLN Take 3 mLs by nebulization every 4 (four) hours. Qty: 360 mL    lisinopril (PRINIVIL,ZESTRIL) 10 MG tablet Take 10 mg by mouth daily.    omeprazole (PRILOSEC) 20 MG capsule Take 20 mg by mouth daily.    sertraline (ZOLOFT) 25 MG tablet Take 25 mg by mouth 2 (two) times daily.    benzonatate (TESSALON) 200 MG capsule Take 1 capsule (200 mg total) by mouth 3 (three) times daily. Qty: 20 capsule, Refills: 0    Fluticasone-Salmeterol (ADVAIR) 500-50 MCG/DOSE AEPB Inhale 1 puff into the lungs 2 (two) times daily.    nicotine (NICODERM CQ - DOSED IN MG/24 HOURS) 21 mg/24hr patch Place 1 patch (21 mg total) onto the skin daily. Qty: 28 patch, Refills: 0      STOP taking these medications     albuterol (PROVENTIL HFA;VENTOLIN HFA) 108 (90 Base) MCG/ACT inhaler      hydrOXYzine (ATARAX/VISTARIL) 25 MG tablet      umeclidinium bromide (INCRUSE ELLIPTA) 62.5 MCG/INH AEPB      guaiFENesin (MUCINEX) 600 MG 12 hr tablet      oxyCODONE-acetaminophen (PERCOCET) 7.5-325 MG tablet      pantoprazole (PROTONIX) 40 MG tablet          DISCHARGE INSTRUCTIONS:    Need to use bipap at night.  Follow with PMD in 2 weeks.  If you experience worsening of your admission symptoms, develop shortness of breath, life threatening emergency, suicidal or homicidal thoughts you must seek medical attention immediately by calling 911 or calling your MD immediately  if symptoms less severe.  You Must read complete instructions/literature along with all the possible  adverse reactions/side effects for all the Medicines you take and that have been prescribed to you. Take any new Medicines after you have completely understood and accept all the possible adverse reactions/side effects.   Please note  You were cared for by a hospitalist during your hospital stay. If you have any questions about your discharge medications or the care you received while you were in the hospital after you are discharged, you can call the unit and asked to speak with the hospitalist on call if the hospitalist that took care of you is not available. Once you are discharged, your primary care physician will handle any further medical issues. Please note that NO REFILLS for any discharge medications will be authorized once you are discharged, as it is imperative that you return to your primary care physician (or establish a relationship with a primary care physician if you do not have one) for your aftercare needs so that they can reassess your need for medications and monitor your  lab values.    Today   CHIEF COMPLAINT:   Chief Complaint  Patient presents with  . Chest Pain  . Shortness of Breath    HISTORY OF PRESENT ILLNESS:  Jeremiah [name]  is a 66 y.o. male with a known history of COPD and HTN who presents to ER with 4-5 day hx of progressive SOB with cough. CXR in ER OK but pt profoundly hypoxic with CO2 retention requiring BiPAP. He is now admitted. No fevfer. Denies CP. Still smoking.   VITAL SIGNS:  Blood pressure 127/64, pulse 72, temperature 97.7 F (36.5 C), temperature source Oral, resp. rate 18, height 6\' 1"  (1.854 m), weight 87.2 kg (192 lb 5 oz), SpO2 93 %.  I/O:   Intake/Output Summary (Last 24 hours) at 10/20/16 0806 Last data filed at 10/19/16 2109  Gross per 24 hour  Intake              960 ml  Output             1250 ml  Net             -290 ml    PHYSICAL EXAMINATION:   GENERAL:  66 y.o.-year-old patient lying in the bed in mild respiratory distress.   EYES: Pupils equal, round, reactive to light and accommodation. No scleral icterus. Extraocular muscles intact.  HEENT: Head atraumatic, normocephalic. Oropharynx and nasopharynx clear.  NECK:  Supple, no jugular venous distention. No thyroid enlargement, no tenderness.  LUNGS: Diminished breath sounds bilaterally,  wheezing present, patient however continues to have prolonged expiratory phase, no rales,rhonchi or crepitations. Using accessory muscles of respiration intermittently.  CARDIOVASCULAR: S1, S2 normal. No murmurs, rubs, or gallops.  ABDOMEN: Soft, nontender, nondistended. Bowel sounds present. No organomegaly or mass.  EXTREMITIES: No pedal edema, cyanosis, or clubbing.  NEUROLOGIC: Cranial nerves II through XII are intact. Muscle strength 5/5 in all extremities. Sensation intact. Gait not checked.  PSYCHIATRIC: The patient is alert and oriented x 3.  SKIN: No obvious rash, lesion, or ulcer.   DATA REVIEW:   CBC  Recent Labs Lab 10/17/16 0440  WBC 15.9*  HGB 13.2  HCT 38.3*  PLT 328    Chemistries   Recent Labs Lab 10/16/16 0355 10/17/16 0440  10/20/16 0457  NA 130* 128*  < > 128*  K 4.5 4.7  < > 5.1  CL 94* 94*  < > 87*  CO2 27 27  < > 34*  GLUCOSE 142* 180*  < > 137*  BUN 13 21*  < > 34*  CREATININE 0.66 0.59*  < > 0.82  CALCIUM 9.0 8.7*  < > 9.1  MG  --  2.0  --   --   AST 25  --   --   --   ALT 30  --   --   --   ALKPHOS 133*  --   --   --   BILITOT 0.7  --   --   --   < > = values in this interval not displayed.  Cardiac Enzymes  Recent Labs Lab 10/15/16 1534  TROPONINI <0.03    Microbiology Results  Results for orders placed or performed during the hospital encounter of 10/15/16  MRSA PCR Screening     Status: None   Collection Time: 10/15/16  8:07 PM  Result Value Ref Range Status   MRSA by PCR NEGATIVE NEGATIVE Final    Comment:        The GeneXpert MRSA  Assay (FDA approved for NASAL specimens only), is one component of  a comprehensive MRSA colonization surveillance program. It is not intended to diagnose MRSA infection nor to guide or monitor treatment for MRSA infections.     RADIOLOGY:  No results found.  EKG:   Orders placed or performed during the hospital encounter of 10/15/16  . EKG 12-Lead  . EKG 12-Lead  . ED EKG within 10 minutes  . ED EKG within 10 minutes      Management plans discussed with the patient, family and they are in agreement.  CODE STATUS:     Code Status Orders        Start     Ordered   10/15/16 2012  Full code  Continuous     10/15/16 2011    Code Status History    Date Active Date Inactive Code Status Order ID Comments User Context   07/28/2015  2:57 PM 08/06/2015  8:06 PM Full Code [id number]  Gladstone Lighter, MD Inpatient      TOTAL TIME TAKING CARE OF THIS PATIENT: 35 minutes.    Vaughan Basta M.D on 10/20/2016 at 8:06 AM  Between 7am to 6pm - Pager - [phone]  After 6pm go to [email or web address] - password [email or web address] [facility]  Office  [phone]  CC: Primary care physician; Herminio Commons, MD   Note: This dictation was prepared with Dragon dictation along with smaller phrase technology. Any transcriptional errors that result from this process are unintentional.

## 2016-10-20 NOTE — Progress Notes (Signed)
Pt is being discharged today. Iv x1 was removed. He was changed into a transfer pack. He will be leaving the facility on 3L of oxygen per [place]. All belongings were packed and returned to the patient. Report was called to [place] at [facility]. Awaiting EMS for transport.

## 2018-07-19 ENCOUNTER — Other Ambulatory Visit: Payer: Self-pay | Admitting: Family Medicine

## 2018-07-19 DIAGNOSIS — Z13820 Encounter for screening for osteoporosis: Secondary | ICD-10-CM

## 2019-12-04 ENCOUNTER — Encounter: Payer: Self-pay | Admitting: Ophthalmology

## 2019-12-04 ENCOUNTER — Other Ambulatory Visit: Payer: Self-pay

## 2019-12-05 ENCOUNTER — Other Ambulatory Visit
Admission: RE | Admit: 2019-12-05 | Discharge: 2019-12-05 | Disposition: A | Discharge status: Home / Self Care | Payer: Medicare HMO | Source: Ambulatory Visit | Attending: Ophthalmology | Admitting: Ophthalmology

## 2019-12-05 DIAGNOSIS — Z01812 Encounter for preprocedural laboratory examination: Secondary | ICD-10-CM | POA: Diagnosis present

## 2019-12-05 DIAGNOSIS — Z20822 Contact with and (suspected) exposure to covid-19: Secondary | ICD-10-CM | POA: Insufficient documentation

## 2019-12-05 LAB — SARS CORONAVIRUS 2 (TAT 6-24 HRS): SARS Coronavirus 2: NEGATIVE

## 2019-12-05 NOTE — Discharge Instructions (Signed)

## 2019-12-09 ENCOUNTER — Other Ambulatory Visit: Payer: Self-pay

## 2019-12-09 ENCOUNTER — Ambulatory Visit
Admission: RE | Admit: 2019-12-09 | Discharge: 2019-12-09 | Disposition: A | Discharge status: Home / Self Care | Payer: Medicare HMO | Attending: Ophthalmology | Admitting: Ophthalmology

## 2019-12-09 ENCOUNTER — Ambulatory Visit: Payer: Medicare HMO | Admitting: Anesthesiology

## 2019-12-09 ENCOUNTER — Encounter
Admission: RE | Disposition: A | Discharge status: Home / Self Care | Payer: Self-pay | Source: Home / Self Care | Attending: Ophthalmology

## 2019-12-09 ENCOUNTER — Encounter: Payer: Self-pay | Admitting: Ophthalmology

## 2019-12-09 DIAGNOSIS — K219 Gastro-esophageal reflux disease without esophagitis: Secondary | ICD-10-CM | POA: Insufficient documentation

## 2019-12-09 DIAGNOSIS — E7800 Pure hypercholesterolemia, unspecified: Secondary | ICD-10-CM | POA: Diagnosis not present

## 2019-12-09 DIAGNOSIS — H2512 Age-related nuclear cataract, left eye: Secondary | ICD-10-CM | POA: Insufficient documentation

## 2019-12-09 DIAGNOSIS — Z7951 Long term (current) use of inhaled steroids: Secondary | ICD-10-CM | POA: Diagnosis not present

## 2019-12-09 DIAGNOSIS — E785 Hyperlipidemia, unspecified: Secondary | ICD-10-CM | POA: Insufficient documentation

## 2019-12-09 DIAGNOSIS — Z9981 Dependence on supplemental oxygen: Secondary | ICD-10-CM | POA: Diagnosis not present

## 2019-12-09 DIAGNOSIS — Z79899 Other long term (current) drug therapy: Secondary | ICD-10-CM | POA: Insufficient documentation

## 2019-12-09 DIAGNOSIS — J449 Chronic obstructive pulmonary disease, unspecified: Secondary | ICD-10-CM | POA: Insufficient documentation

## 2019-12-09 DIAGNOSIS — I10 Essential (primary) hypertension: Secondary | ICD-10-CM | POA: Insufficient documentation

## 2019-12-09 HISTORY — PX: CATARACT EXTRACTION W/PHACO: SHX586

## 2019-12-09 HISTORY — DX: Nicotine dependence, unspecified, uncomplicated: F17.200

## 2019-12-09 HISTORY — DX: Dependence on supplemental oxygen: Z99.81

## 2019-12-09 SURGERY — PHACOEMULSIFICATION, CATARACT, WITH IOL INSERTION
Anesthesia: Monitor Anesthesia Care | Site: Eye | Laterality: Left

## 2019-12-09 MED ORDER — ACETAMINOPHEN 160 MG/5ML PO SOLN: 325.0000 mg | ORAL | Status: DC | PRN

## 2019-12-09 MED ORDER — EPINEPHRINE PF 1 MG/ML IJ SOLN
INTRAOCULAR | Status: DC | PRN
  Administered 2019-12-09: 49 mL via OPHTHALMIC

## 2019-12-09 MED ORDER — MIDAZOLAM HCL 2 MG/2ML IJ SOLN
INTRAMUSCULAR | Status: DC | PRN
  Administered 2019-12-09: .5 mg via INTRAVENOUS

## 2019-12-09 MED ORDER — ACETAMINOPHEN 325 MG PO TABS: 325.0000 mg | ORAL_TABLET | ORAL | Status: DC | PRN

## 2019-12-09 MED ORDER — ONDANSETRON HCL 4 MG/2ML IJ SOLN: 4.0000 mg | Freq: Once | INTRAMUSCULAR | Status: DC | PRN

## 2019-12-09 MED ORDER — TETRACAINE HCL 0.5 % OP SOLN
1.0000 [drp] | OPHTHALMIC | Status: DC | PRN
  Administered 2019-12-09 (×3): 1 [drp] via OPHTHALMIC

## 2019-12-09 MED ORDER — TRYPAN BLUE 0.06 % OP SOLN
OPHTHALMIC | Status: DC | PRN
  Administered 2019-12-09: 0.5 mL via INTRAOCULAR

## 2019-12-09 MED ORDER — NA CHONDROIT SULF-NA HYALURON 40-17 MG/ML IO SOLN
INTRAOCULAR | Status: DC | PRN
  Administered 2019-12-09: 1 mL via INTRAOCULAR

## 2019-12-09 MED ORDER — BRIMONIDINE TARTRATE-TIMOLOL 0.2-0.5 % OP SOLN
OPHTHALMIC | Status: DC | PRN
  Administered 2019-12-09: 1 [drp] via OPHTHALMIC

## 2019-12-09 MED ORDER — LIDOCAINE HCL (PF) 2 % IJ SOLN
INTRAOCULAR | Status: DC | PRN
  Administered 2019-12-09: 2 mL

## 2019-12-09 MED ORDER — ARMC OPHTHALMIC DILATING DROPS
1.0000 "application " | OPHTHALMIC | Status: DC | PRN
  Administered 2019-12-09 (×3): 1 via OPHTHALMIC

## 2019-12-09 MED ORDER — MOXIFLOXACIN HCL 0.5 % OP SOLN
OPHTHALMIC | Status: DC | PRN
  Administered 2019-12-09: 0.2 mL via OPHTHALMIC

## 2019-12-09 SURGICAL SUPPLY — 20 items
CANNULA ANT/CHMB 27G (MISCELLANEOUS) ×2 IMPLANT
CANNULA ANT/CHMB 27GA (MISCELLANEOUS) ×9 IMPLANT
GLOVE SURG LX 8.0 MICRO (GLOVE) ×2
GLOVE SURG LX STRL 8.0 MICRO (GLOVE) ×1 IMPLANT
GLOVE SURG TRIUMPH 8.0 PF LTX (GLOVE) ×3 IMPLANT
GOWN STRL REUS W/ TWL LRG LVL3 (GOWN DISPOSABLE) ×2 IMPLANT
GOWN STRL REUS W/TWL LRG LVL3 (GOWN DISPOSABLE) ×6
LENS IOL DIOP 19.5 (Intraocular Lens) ×3 IMPLANT
LENS IOL TECNIS MONO 19.5 (Intraocular Lens) IMPLANT
MARKER SKIN DUAL TIP RULER LAB (MISCELLANEOUS) ×3 IMPLANT
NDL FILTER BLUNT 18X1 1/2 (NEEDLE) ×1 IMPLANT
NEEDLE FILTER BLUNT 18X 1/2SAF (NEEDLE) ×2
NEEDLE FILTER BLUNT 18X1 1/2 (NEEDLE) ×1 IMPLANT
PACK EYE AFTER SURG (MISCELLANEOUS) ×3 IMPLANT
PACK OPTHALMIC (MISCELLANEOUS) ×3 IMPLANT
PACK PORFILIO (MISCELLANEOUS) ×3 IMPLANT
SYR 3ML LL SCALE MARK (SYRINGE) ×3 IMPLANT
SYR TB 1ML LUER SLIP (SYRINGE) ×3 IMPLANT
WATER STERILE IRR 250ML POUR (IV SOLUTION) ×3 IMPLANT
WIPE NON LINTING 3.25X3.25 (MISCELLANEOUS) ×3 IMPLANT

## 2019-12-09 NOTE — Anesthesia Procedure Notes (Signed)
Procedure Name: MAC Date/Time: 12/09/2019 9:17 AM Performed by: Elisavet Buehrer A, CRNA Pre-anesthesia Checklist: Patient identified, Emergency Drugs available, Suction available, Timeout performed and Patient being monitored Patient Re-evaluated:Patient Re-evaluated prior to induction Oxygen Delivery Method: Nasal cannula Placement Confirmation: positive ETCO2       

## 2019-12-09 NOTE — Transfer of Care (Signed)
Immediate Anesthesia Transfer of Care Note  Patient: Jeremiah [name]  Procedure(s) Performed: CATARACT EXTRACTION PHACO AND INTRAOCULAR LENS PLACEMENT (IOC) COMPLICATED LEFT 65.79 03:83.3 (Left Eye)  Patient Location: PACU  Anesthesia Type: MAC  Level of Consciousness: awake, alert  and patient cooperative  Airway and Oxygen Therapy: Patient Spontanous Breathing and Patient connected to supplemental oxygen  Post-op Assessment: Post-op Vital signs reviewed, Patient's Cardiovascular Status Stable, Respiratory Function Stable, Patent Airway and No signs of Nausea or vomiting  Post-op Vital Signs: Reviewed and stable  Complications: No complications documented.

## 2019-12-09 NOTE — Op Note (Signed)
PREOPERATIVE DIAGNOSIS:  Nuclear sclerotic cataract of the left eye.   POSTOPERATIVE DIAGNOSIS:  Nuclear sclerotic cataract of the left eye.   OPERATIVE PROCEDURE:@   SURGEON:  Birder Robson, MD.   ANESTHESIA:  Anesthesiologist: Veda Canning, MD CRNA: Nyoka Cowden, CRNA  1.      Managed anesthesia care. 2.     0.89ml of Shugarcaine was instilled following the paracentesis   COMPLICATIONS: Vision Blue was used to stain the anterior capsule due to very poor/ no visualization of the red reflex.    TECHNIQUE:   Stop and chop   DESCRIPTION OF PROCEDURE:  The patient was examined and consented in the preoperative holding area where the aforementioned topical anesthesia was applied to the left eye and then brought back to the Operating Room where the left eye was prepped and draped in the usual sterile ophthalmic fashion and a lid speculum was placed. A paracentesis was created with the side port blade and the anterior chamber was filled with viscoelastic. A near clear corneal incision was performed with the steel keratome. A continuous curvilinear capsulorrhexis was performed with a cystotome followed by the capsulorrhexis forceps. Hydrodissection and hydrodelineation were carried out with BSS on a blunt cannula. The lens was removed in a stop and chop  technique and the remaining cortical material was removed with the irrigation-aspiration handpiece. The capsular bag was inflated with viscoelastic and the Technis [id number] lens was placed in the capsular bag without complication. The remaining viscoelastic was removed from the eye with the irrigation-aspiration handpiece. The wounds were hydrated. The anterior chamber was flushed with BSS and the eye was inflated to physiologic pressure. 0.68ml Vigamox was placed in the anterior chamber. The wounds were found to be water tight. The eye was dressed with Combigan. The patient was given protective glasses to wear throughout the day and a shield  with which to sleep tonight. The patient was also given drops with which to begin a drop regimen today and will follow-up with me in one day. Implant Name Type Inv. Item Serial No. Manufacturer Lot No. LRB No. Used Action  LENS IOL DIOP 19.5 - [id number] Intraocular Lens LENS IOL DIOP 19.5 [id number] [place]  Left 1 Implanted    Procedure(s) with comments: CATARACT EXTRACTION PHACO AND INTRAOCULAR LENS PLACEMENT (IOC) COMPLICATED LEFT 36.64 40:34.7 (Left) - [place]  Electronically signed: Birder Robson 12/09/2019 9:37 AM

## 2019-12-09 NOTE — Anesthesia Postprocedure Evaluation (Signed)
Anesthesia Post Note  Patient: Jeremiah [name]  Procedure(s) Performed: CATARACT EXTRACTION PHACO AND INTRAOCULAR LENS PLACEMENT (IOC) COMPLICATED LEFT 84.69 62:95.2 (Left Eye)     Patient location during evaluation: PACU Anesthesia Type: MAC Level of consciousness: awake Pain management: pain level controlled Vital Signs Assessment: post-procedure vital signs reviewed and stable Respiratory status: respiratory function stable Cardiovascular status: stable Postop Assessment: no apparent nausea or vomiting Anesthetic complications: no   No complications documented.  Veda Canning

## 2019-12-09 NOTE — Anesthesia Procedure Notes (Signed)
Procedure Name: MAC Date/Time: 12/09/2019 9:17 AM Performed by: Nyoka Cowden, CRNA Pre-anesthesia Checklist: Patient identified, Emergency Drugs available, Suction available, Timeout performed and Patient being monitored Patient Re-evaluated:Patient Re-evaluated prior to induction Oxygen Delivery Method: Nasal cannula Placement Confirmation: positive ETCO2

## 2019-12-09 NOTE — H&P (Signed)
All labs reviewed. Abnormal studies sent to patients PCP when indicated.  Previous H&P reviewed, patient examined, there are NO CHANGES.  Gwyndolyn Saxon Porfilio8/3/20219:03 AM

## 2019-12-09 NOTE — Anesthesia Preprocedure Evaluation (Addendum)
Anesthesia Evaluation  Patient identified by MRN, date of birth, ID band Patient awake    Reviewed: Allergy & Precautions, NPO status   Airway Mallampati: II  TM Distance: >3 FB     Dental   Pulmonary COPD,  oxygen dependent, Current SmokerPatient did not abstain from smoking.,    breath sounds clear to auscultation       Cardiovascular hypertension,  Rhythm:Regular Rate:Normal  HLD   Neuro/Psych    GI/Hepatic GERD  ,  Endo/Other    Renal/GU      Musculoskeletal   Abdominal   Peds  Hematology   Anesthesia Other Findings   Reproductive/Obstetrics                            Anesthesia Physical Anesthesia Plan  ASA: IV  Anesthesia Plan: MAC   Post-op Pain Management:    Induction: Intravenous  PONV Risk Score and Plan: TIVA, Midazolam and Treatment may vary due to age or medical condition  Airway Management Planned: Natural Airway and Nasal Cannula  Additional Equipment:   Intra-op Plan:   Post-operative Plan:   Informed Consent: I have reviewed the patients History and Physical, chart, labs and discussed the procedure including the risks, benefits and alternatives for the proposed anesthesia with the patient or authorized representative who has indicated his/her understanding and acceptance.       Plan Discussed with: CRNA  Anesthesia Plan Comments:        Anesthesia Quick Evaluation

## 2019-12-10 ENCOUNTER — Encounter: Payer: Self-pay | Admitting: Ophthalmology

## 2020-01-23 ENCOUNTER — Other Ambulatory Visit
Admission: RE | Admit: 2020-01-23 | Discharge: 2020-01-23 | Disposition: A | Discharge status: Home / Self Care | Payer: Medicare HMO | Source: Ambulatory Visit | Attending: Ophthalmology | Admitting: Ophthalmology

## 2020-01-23 ENCOUNTER — Other Ambulatory Visit: Payer: Self-pay

## 2020-01-23 DIAGNOSIS — Z01812 Encounter for preprocedural laboratory examination: Secondary | ICD-10-CM | POA: Insufficient documentation

## 2020-01-23 DIAGNOSIS — Z20822 Contact with and (suspected) exposure to covid-19: Secondary | ICD-10-CM | POA: Insufficient documentation

## 2020-01-24 LAB — SARS CORONAVIRUS 2 (TAT 6-24 HRS): SARS Coronavirus 2: NEGATIVE

## 2020-01-26 NOTE — Discharge Instructions (Signed)

## 2020-01-27 ENCOUNTER — Encounter: Payer: Self-pay | Admitting: Ophthalmology

## 2020-01-27 ENCOUNTER — Other Ambulatory Visit: Payer: Self-pay

## 2020-01-27 ENCOUNTER — Encounter
Admission: RE | Disposition: A | Discharge status: Home / Self Care | Payer: Self-pay | Source: Home / Self Care | Attending: Ophthalmology

## 2020-01-27 ENCOUNTER — Ambulatory Visit: Payer: Medicare HMO | Admitting: Anesthesiology

## 2020-01-27 ENCOUNTER — Ambulatory Visit
Admission: RE | Admit: 2020-01-27 | Discharge: 2020-01-27 | Disposition: A | Discharge status: Home / Self Care | Payer: Medicare HMO | Attending: Ophthalmology | Admitting: Ophthalmology

## 2020-01-27 DIAGNOSIS — Z880 Allergy status to penicillin: Secondary | ICD-10-CM | POA: Insufficient documentation

## 2020-01-27 DIAGNOSIS — Z9981 Dependence on supplemental oxygen: Secondary | ICD-10-CM | POA: Insufficient documentation

## 2020-01-27 DIAGNOSIS — Z79899 Other long term (current) drug therapy: Secondary | ICD-10-CM | POA: Insufficient documentation

## 2020-01-27 DIAGNOSIS — I10 Essential (primary) hypertension: Secondary | ICD-10-CM | POA: Insufficient documentation

## 2020-01-27 DIAGNOSIS — Z7951 Long term (current) use of inhaled steroids: Secondary | ICD-10-CM | POA: Insufficient documentation

## 2020-01-27 DIAGNOSIS — J449 Chronic obstructive pulmonary disease, unspecified: Secondary | ICD-10-CM | POA: Diagnosis not present

## 2020-01-27 DIAGNOSIS — E7800 Pure hypercholesterolemia, unspecified: Secondary | ICD-10-CM | POA: Insufficient documentation

## 2020-01-27 DIAGNOSIS — K219 Gastro-esophageal reflux disease without esophagitis: Secondary | ICD-10-CM | POA: Diagnosis not present

## 2020-01-27 DIAGNOSIS — F17200 Nicotine dependence, unspecified, uncomplicated: Secondary | ICD-10-CM | POA: Diagnosis not present

## 2020-01-27 DIAGNOSIS — H2511 Age-related nuclear cataract, right eye: Secondary | ICD-10-CM | POA: Diagnosis present

## 2020-01-27 HISTORY — PX: CATARACT EXTRACTION W/PHACO: SHX586

## 2020-01-27 SURGERY — PHACOEMULSIFICATION, CATARACT, WITH IOL INSERTION
Anesthesia: Monitor Anesthesia Care | Site: Eye | Laterality: Right

## 2020-01-27 MED ORDER — TETRACAINE HCL 0.5 % OP SOLN
1.0000 [drp] | OPHTHALMIC | Status: DC | PRN
  Administered 2020-01-27 (×3): 1 [drp] via OPHTHALMIC

## 2020-01-27 MED ORDER — TRYPAN BLUE 0.06 % OP SOLN
OPHTHALMIC | Status: DC | PRN
  Administered 2020-01-27: 0.5 mL via INTRAOCULAR

## 2020-01-27 MED ORDER — NA CHONDROIT SULF-NA HYALURON 40-17 MG/ML IO SOLN
INTRAOCULAR | Status: DC | PRN
  Administered 2020-01-27: 1 mL via INTRAOCULAR

## 2020-01-27 MED ORDER — EPINEPHRINE PF 1 MG/ML IJ SOLN
INTRAOCULAR | Status: DC | PRN
  Administered 2020-01-27: 81 mL via OPHTHALMIC

## 2020-01-27 MED ORDER — LIDOCAINE HCL (PF) 2 % IJ SOLN
INTRAOCULAR | Status: DC | PRN
  Administered 2020-01-27: 1 mL

## 2020-01-27 MED ORDER — ARMC OPHTHALMIC DILATING DROPS
1.0000 "application " | OPHTHALMIC | Status: DC | PRN
  Administered 2020-01-27 (×3): 1 via OPHTHALMIC

## 2020-01-27 MED ORDER — BRIMONIDINE TARTRATE-TIMOLOL 0.2-0.5 % OP SOLN
OPHTHALMIC | Status: DC | PRN
  Administered 2020-01-27: 1 [drp] via OPHTHALMIC

## 2020-01-27 MED ORDER — MIDAZOLAM HCL 2 MG/2ML IJ SOLN
INTRAMUSCULAR | Status: DC | PRN
  Administered 2020-01-27: 1 mg via INTRAVENOUS

## 2020-01-27 MED ORDER — MOXIFLOXACIN HCL 0.5 % OP SOLN
OPHTHALMIC | Status: DC | PRN
  Administered 2020-01-27: 0.2 mL via OPHTHALMIC

## 2020-01-27 SURGICAL SUPPLY — 20 items
CANNULA ANT/CHMB 27G (MISCELLANEOUS) ×2 IMPLANT
CANNULA ANT/CHMB 27GA (MISCELLANEOUS) ×9 IMPLANT
GLOVE SURG LX 8.0 MICRO (GLOVE) ×2
GLOVE SURG LX STRL 8.0 MICRO (GLOVE) ×1 IMPLANT
GLOVE SURG TRIUMPH 8.0 PF LTX (GLOVE) ×3 IMPLANT
GOWN STRL REUS W/ TWL LRG LVL3 (GOWN DISPOSABLE) ×2 IMPLANT
GOWN STRL REUS W/TWL LRG LVL3 (GOWN DISPOSABLE) ×6
LENS IOL DIOP 20.0 (Intraocular Lens) ×3 IMPLANT
LENS IOL TECNIS MONO 20.0 (Intraocular Lens) IMPLANT
MARKER SKIN DUAL TIP RULER LAB (MISCELLANEOUS) ×3 IMPLANT
NDL FILTER BLUNT 18X1 1/2 (NEEDLE) ×1 IMPLANT
NEEDLE FILTER BLUNT 18X 1/2SAF (NEEDLE) ×2
NEEDLE FILTER BLUNT 18X1 1/2 (NEEDLE) ×1 IMPLANT
PACK EYE AFTER SURG (MISCELLANEOUS) ×3 IMPLANT
PACK OPTHALMIC (MISCELLANEOUS) ×3 IMPLANT
PACK PORFILIO (MISCELLANEOUS) ×3 IMPLANT
SYR 3ML LL SCALE MARK (SYRINGE) ×3 IMPLANT
SYR TB 1ML LUER SLIP (SYRINGE) ×3 IMPLANT
WATER STERILE IRR 250ML POUR (IV SOLUTION) ×3 IMPLANT
WIPE NON LINTING 3.25X3.25 (MISCELLANEOUS) ×3 IMPLANT

## 2020-01-27 NOTE — Anesthesia Preprocedure Evaluation (Signed)
Anesthesia Evaluation  Patient identified by MRN, date of birth, ID band Patient awake    History of Anesthesia Complications Negative for: history of anesthetic complications  Airway Mallampati: II  TM Distance: >3 FB Neck ROM: Full    Dental  (+) Edentulous Upper, Edentulous Lower   Pulmonary shortness of breath, with exertion and Long-Term Oxygen Therapy, COPD,  COPD inhaler and oxygen dependent, Current SmokerPatient did not abstain from smoking.,    Pulmonary exam normal        Cardiovascular hypertension, Pt. on medications Normal cardiovascular exam     Neuro/Psych    GI/Hepatic GERD  Medicated,  Endo/Other    Renal/GU      Musculoskeletal   Abdominal   Peds  Hematology   Anesthesia Other Findings   Reproductive/Obstetrics                             Anesthesia Physical Anesthesia Plan  ASA: III  Anesthesia Plan: MAC   Post-op Pain Management:    Induction: Intravenous  PONV Risk Score and Plan: 0 and Treatment may vary due to age or medical condition, Midazolam and TIVA  Airway Management Planned: Nasal Cannula and Natural Airway  Additional Equipment: None  Intra-op Plan:   Post-operative Plan:   Informed Consent: I have reviewed the patients History and Physical, chart, labs and discussed the procedure including the risks, benefits and alternatives for the proposed anesthesia with the patient or authorized representative who has indicated his/her understanding and acceptance.       Plan Discussed with: CRNA  Anesthesia Plan Comments:         Anesthesia Quick Evaluation

## 2020-01-27 NOTE — Op Note (Signed)
PREOPERATIVE DIAGNOSIS:  Nuclear sclerotic cataract of the right eye.   POSTOPERATIVE DIAGNOSIS:  H25.89 Cataract   OPERATIVE PROCEDURE:@   SURGEON:  Birder Robson, MD.   ANESTHESIA:  Anesthesiologist: Page, Adele Barthel, MD CRNA: Silvana Newness, CRNA  1.      Managed anesthesia care. 2.      0.67ml of Shugarcaine was instilled in the eye following the paracentesis.   COMPLICATIONS: Vision Blue was used to stain the anterior capsule due to very poor/ no visualization of the red reflex.    TECHNIQUE:   Stop and chop   DESCRIPTION OF PROCEDURE:  The patient was examined and consented in the preoperative holding area where the aforementioned topical anesthesia was applied to the right eye and then brought back to the Operating Room where the right eye was prepped and draped in the usual sterile ophthalmic fashion and a lid speculum was placed. A paracentesis was created with the side port blade and the anterior chamber was filled with viscoelastic. A near clear corneal incision was performed with the steel keratome. A continuous curvilinear capsulorrhexis was performed with a cystotome followed by the capsulorrhexis forceps. Hydrodissection and hydrodelineation were carried out with BSS on a blunt cannula. The lens was removed in a stop and chop  technique and the remaining cortical material was removed with the irrigation-aspiration handpiece. The capsular bag was inflated with viscoelastic and the Technis [id number]  lens was placed in the capsular bag without complication. The remaining viscoelastic was removed from the eye with the irrigation-aspiration handpiece. The wounds were hydrated. The anterior chamber was flushed with BSS and the eye was inflated to physiologic pressure. 0.67ml of Vigamox was placed in the anterior chamber. The wounds were found to be water tight. The eye was dressed with Combigan. The patient was given protective glasses to wear throughout the day and a shield with which to  sleep tonight. The patient was also given drops with which to begin a drop regimen today and will follow-up with me in one day. Implant Name Type Inv. Item Serial No. Manufacturer Lot No. LRB No. Used Action  LENS IOL DIOP 20.0 - [id number] Intraocular Lens LENS IOL DIOP 20.0 [id number]   Right 1 Implanted   Procedure(s): CATARACT EXTRACTION PHACO AND INTRAOCULAR LENS PLACEMENT (IOC) RIGHT VISION BLUE 26.83 02:07.6 (Right)  Electronically signed: Birder Robson 01/27/2020 10:18 AM

## 2020-01-27 NOTE — H&P (Signed)
All labs reviewed. Abnormal studies sent to patients PCP when indicated.  Previous H&P reviewed, patient examined, there are NO CHANGES.  Jeremiah Gundy Porfilio9/21/20219:48 AM

## 2020-01-27 NOTE — Transfer of Care (Signed)
Immediate Anesthesia Transfer of Care Note  Patient: Jeremiah [name]  Procedure(s) Performed: CATARACT EXTRACTION PHACO AND INTRAOCULAR LENS PLACEMENT ([place]) RIGHT VISION BLUE (Right Eye)  Patient Location: PACU  Anesthesia Type: MAC  Level of Consciousness: awake, alert  and patient cooperative  Airway and Oxygen Therapy: Patient Spontanous Breathing and Patient connected to supplemental oxygen  Post-op Assessment: Post-op Vital signs reviewed, Patient's Cardiovascular Status Stable, Respiratory Function Stable, Patent Airway and No signs of Nausea or vomiting  Post-op Vital Signs: Reviewed and stable  Complications: No complications documented.

## 2020-01-27 NOTE — Anesthesia Procedure Notes (Signed)
Procedure Name: MAC Date/Time: 01/27/2020 9:58 AM Performed by: Silvana Newness, CRNA Pre-anesthesia Checklist: Patient identified, Emergency Drugs available, Suction available, Patient being monitored and Timeout performed Patient Re-evaluated:Patient Re-evaluated prior to induction Oxygen Delivery Method: Nasal cannula Placement Confirmation: positive ETCO2

## 2020-01-27 NOTE — Anesthesia Postprocedure Evaluation (Signed)
Anesthesia Post Note  Patient: Jeremiah [name]  Procedure(s) Performed: CATARACT EXTRACTION PHACO AND INTRAOCULAR LENS PLACEMENT (I[facility]) RIGHT VISION BLUE 26.83 02:07.6 (Right Eye)     Patient location during evaluation: PACU Anesthesia Type: MAC Level of consciousness: awake and alert Pain management: pain level controlled Vital Signs Assessment: post-procedure vital signs reviewed and stable Respiratory status: spontaneous breathing Cardiovascular status: blood pressure returned to baseline Postop Assessment: no apparent nausea or vomiting, adequate PO intake and no headache Anesthetic complications: no   No complications documented.  Adele Barthel Greg Cratty

## 2020-01-28 ENCOUNTER — Encounter: Payer: Self-pay | Admitting: Ophthalmology

## 2020-05-14 ENCOUNTER — Encounter: Payer: Self-pay | Admitting: Emergency Medicine

## 2020-05-14 ENCOUNTER — Inpatient Hospital Stay
Admission: EM | Admit: 2020-05-14 | Discharge: 2020-05-25 | DRG: 871 | Disposition: A | Discharge status: Skilled Nursing Facility | Payer: Medicare HMO | Attending: Internal Medicine | Admitting: Internal Medicine

## 2020-05-14 ENCOUNTER — Emergency Department: Payer: Medicare HMO

## 2020-05-14 ENCOUNTER — Other Ambulatory Visit: Payer: Self-pay

## 2020-05-14 DIAGNOSIS — J9621 Acute and chronic respiratory failure with hypoxia: Secondary | ICD-10-CM | POA: Diagnosis present

## 2020-05-14 DIAGNOSIS — J1282 Pneumonia due to coronavirus disease 2019: Secondary | ICD-10-CM | POA: Diagnosis present

## 2020-05-14 DIAGNOSIS — Z8249 Family history of ischemic heart disease and other diseases of the circulatory system: Secondary | ICD-10-CM

## 2020-05-14 DIAGNOSIS — F17200 Nicotine dependence, unspecified, uncomplicated: Secondary | ICD-10-CM

## 2020-05-14 DIAGNOSIS — R7401 Elevation of levels of liver transaminase levels: Secondary | ICD-10-CM | POA: Diagnosis present

## 2020-05-14 DIAGNOSIS — U071 COVID-19: Secondary | ICD-10-CM | POA: Diagnosis present

## 2020-05-14 DIAGNOSIS — J440 Chronic obstructive pulmonary disease with acute lower respiratory infection: Secondary | ICD-10-CM | POA: Diagnosis present

## 2020-05-14 DIAGNOSIS — I10 Essential (primary) hypertension: Secondary | ICD-10-CM

## 2020-05-14 DIAGNOSIS — L03115 Cellulitis of right lower limb: Secondary | ICD-10-CM | POA: Diagnosis present

## 2020-05-14 DIAGNOSIS — D75839 Thrombocytosis, unspecified: Secondary | ICD-10-CM | POA: Diagnosis present

## 2020-05-14 DIAGNOSIS — E871 Hypo-osmolality and hyponatremia: Secondary | ICD-10-CM

## 2020-05-14 DIAGNOSIS — J438 Other emphysema: Secondary | ICD-10-CM

## 2020-05-14 DIAGNOSIS — Z9981 Dependence on supplemental oxygen: Secondary | ICD-10-CM | POA: Diagnosis not present

## 2020-05-14 DIAGNOSIS — J9611 Chronic respiratory failure with hypoxia: Secondary | ICD-10-CM

## 2020-05-14 DIAGNOSIS — L97519 Non-pressure chronic ulcer of other part of right foot with unspecified severity: Secondary | ICD-10-CM | POA: Diagnosis present

## 2020-05-14 DIAGNOSIS — R945 Abnormal results of liver function studies: Secondary | ICD-10-CM

## 2020-05-14 DIAGNOSIS — Z79899 Other long term (current) drug therapy: Secondary | ICD-10-CM

## 2020-05-14 DIAGNOSIS — A419 Sepsis, unspecified organism: Secondary | ICD-10-CM | POA: Diagnosis present

## 2020-05-14 DIAGNOSIS — L0390 Cellulitis, unspecified: Secondary | ICD-10-CM | POA: Diagnosis present

## 2020-05-14 DIAGNOSIS — J449 Chronic obstructive pulmonary disease, unspecified: Secondary | ICD-10-CM

## 2020-05-14 DIAGNOSIS — J441 Chronic obstructive pulmonary disease with (acute) exacerbation: Secondary | ICD-10-CM | POA: Diagnosis present

## 2020-05-14 DIAGNOSIS — E875 Hyperkalemia: Secondary | ICD-10-CM | POA: Diagnosis present

## 2020-05-14 DIAGNOSIS — Z7951 Long term (current) use of inhaled steroids: Secondary | ICD-10-CM | POA: Diagnosis not present

## 2020-05-14 DIAGNOSIS — R531 Weakness: Secondary | ICD-10-CM | POA: Diagnosis not present

## 2020-05-14 DIAGNOSIS — Z23 Encounter for immunization: Secondary | ICD-10-CM | POA: Diagnosis present

## 2020-05-14 DIAGNOSIS — R7989 Other specified abnormal findings of blood chemistry: Secondary | ICD-10-CM

## 2020-05-14 DIAGNOSIS — K219 Gastro-esophageal reflux disease without esophagitis: Secondary | ICD-10-CM | POA: Diagnosis present

## 2020-05-14 DIAGNOSIS — F17210 Nicotine dependence, cigarettes, uncomplicated: Secondary | ICD-10-CM | POA: Diagnosis present

## 2020-05-14 HISTORY — DX: Sepsis, unspecified organism: A41.9

## 2020-05-14 LAB — CBC WITH DIFFERENTIAL/PLATELET
Abs Immature Granulocytes: 0.06 10*3/uL (ref 0.00–0.07)
Abs Immature Granulocytes: 0.06 10*3/uL (ref 0.00–0.07)
Basophils Absolute: 0 10*3/uL (ref 0.0–0.1)
Basophils Absolute: 0 10*3/uL (ref 0.0–0.1)
Basophils Relative: 0 %
Basophils Relative: 0 %
Eosinophils Absolute: 0 10*3/uL (ref 0.0–0.5)
Eosinophils Absolute: 0 10*3/uL (ref 0.0–0.5)
Eosinophils Relative: 0 %
Eosinophils Relative: 0 %
HCT: 40 % (ref 39.0–52.0)
HCT: 37 % — ABNORMAL LOW (ref 39.0–52.0)
Hemoglobin: 13.9 g/dL (ref 13.0–17.0)
Hemoglobin: 12.5 g/dL — ABNORMAL LOW (ref 13.0–17.0)
Immature Granulocytes: 1 %
Immature Granulocytes: 1 %
Lymphocytes Relative: 4 %
Lymphocytes Relative: 6 %
Lymphs Abs: 0.5 10*3/uL — ABNORMAL LOW (ref 0.7–4.0)
Lymphs Abs: 0.7 10*3/uL (ref 0.7–4.0)
MCH: 30 pg (ref 26.0–34.0)
MCH: 29.5 pg (ref 26.0–34.0)
MCHC: 34.8 g/dL (ref 30.0–36.0)
MCHC: 33.8 g/dL (ref 30.0–36.0)
MCV: 86.2 fL (ref 80.0–100.0)
MCV: 87.3 fL (ref 80.0–100.0)
Monocytes Absolute: 1.1 10*3/uL — ABNORMAL HIGH (ref 0.1–1.0)
Monocytes Absolute: 1.2 10*3/uL — ABNORMAL HIGH (ref 0.1–1.0)
Monocytes Relative: 9 %
Monocytes Relative: 9 %
Neutro Abs: 10.3 10*3/uL — ABNORMAL HIGH (ref 1.7–7.7)
Neutro Abs: 11.1 10*3/uL — ABNORMAL HIGH (ref 1.7–7.7)
Neutrophils Relative %: 86 %
Neutrophils Relative %: 84 %
Platelets: 302 10*3/uL (ref 150–400)
Platelets: 280 10*3/uL (ref 150–400)
RBC: 4.64 MIL/uL (ref 4.22–5.81)
RBC: 4.24 MIL/uL (ref 4.22–5.81)
RDW: 14.5 % (ref 11.5–15.5)
RDW: 14.6 % (ref 11.5–15.5)
WBC: 12 10*3/uL — ABNORMAL HIGH (ref 4.0–10.5)
WBC: 13.1 10*3/uL — ABNORMAL HIGH (ref 4.0–10.5)
nRBC: 0 % (ref 0.0–0.2)
nRBC: 0 % (ref 0.0–0.2)

## 2020-05-14 LAB — COMPREHENSIVE METABOLIC PANEL
ALT: 45 U/L — ABNORMAL HIGH (ref 0–44)
ALT: 41 U/L (ref 0–44)
AST: 56 U/L — ABNORMAL HIGH (ref 15–41)
AST: 50 U/L — ABNORMAL HIGH (ref 15–41)
Albumin: 3.5 g/dL (ref 3.5–5.0)
Albumin: 3 g/dL — ABNORMAL LOW (ref 3.5–5.0)
Alkaline Phosphatase: 188 U/L — ABNORMAL HIGH (ref 38–126)
Alkaline Phosphatase: 158 U/L — ABNORMAL HIGH (ref 38–126)
Anion gap: 11 (ref 5–15)
Anion gap: 11 (ref 5–15)
BUN: 31 mg/dL — ABNORMAL HIGH (ref 8–23)
BUN: 31 mg/dL — ABNORMAL HIGH (ref 8–23)
CO2: 27 mmol/L (ref 22–32)
CO2: 27 mmol/L (ref 22–32)
Calcium: 8.5 mg/dL — ABNORMAL LOW (ref 8.9–10.3)
Calcium: 8.1 mg/dL — ABNORMAL LOW (ref 8.9–10.3)
Chloride: 87 mmol/L — ABNORMAL LOW (ref 98–111)
Chloride: 89 mmol/L — ABNORMAL LOW (ref 98–111)
Creatinine, Ser: 1.09 mg/dL (ref 0.61–1.24)
Creatinine, Ser: 1 mg/dL (ref 0.61–1.24)
GFR, Estimated: >60 mL/min (ref >60)
GFR, Estimated: >60 mL/min (ref >60)
Glucose, Bld: 117 mg/dL — ABNORMAL HIGH (ref 70–99)
Glucose, Bld: 96 mg/dL (ref 70–99)
Potassium: 3.4 mmol/L — ABNORMAL LOW (ref 3.5–5.1)
Potassium: 4 mmol/L (ref 3.5–5.1)
Sodium: 125 mmol/L — ABNORMAL LOW (ref 135–145)
Sodium: 127 mmol/L — ABNORMAL LOW (ref 135–145)
Total Bilirubin: 0.9 mg/dL (ref 0.3–1.2)
Total Bilirubin: 0.9 mg/dL (ref 0.3–1.2)
Total Protein: 7.1 g/dL (ref 6.5–8.1)
Total Protein: 6.1 g/dL — ABNORMAL LOW (ref 6.5–8.1)

## 2020-05-14 LAB — RESP PANEL BY RT-PCR (FLU A&B, COVID) ARPGX2
Influenza A by PCR: NEGATIVE
Influenza B by PCR: NEGATIVE
SARS Coronavirus 2 by RT PCR: POSITIVE — AB

## 2020-05-14 LAB — PROTIME-INR
INR: 1 (ref 0.8–1.2)
Prothrombin Time: 12.8 seconds (ref 11.4–15.2)

## 2020-05-14 LAB — BRAIN NATRIURETIC PEPTIDE: B Natriuretic Peptide: 30.6 pg/mL (ref 0.0–100.0)

## 2020-05-14 LAB — TROPONIN I (HIGH SENSITIVITY)
Troponin I (High Sensitivity): 10 ng/L (ref <18)
Troponin I (High Sensitivity): 10 ng/L (ref <18)

## 2020-05-14 LAB — LACTIC ACID, PLASMA: Lactic Acid, Venous: 1.3 mmol/L (ref 0.5–1.9)

## 2020-05-14 LAB — OSMOLALITY: Osmolality: 274 mOsm/kg — ABNORMAL LOW (ref 275–295)

## 2020-05-14 LAB — APTT: aPTT: 35 seconds (ref 24–36)

## 2020-05-14 MED ORDER — IPRATROPIUM-ALBUTEROL 0.5-2.5 (3) MG/3ML IN SOLN
3.0000 mL | Freq: Four times a day (QID) | RESPIRATORY_TRACT | Status: DC
  Administered 2020-05-14 – 2020-05-17 (×11): 3 mL via RESPIRATORY_TRACT
  Filled 2020-05-14 (×12): qty 3

## 2020-05-14 MED ORDER — VANCOMYCIN HCL IN DEXTROSE 1-5 GM/200ML-% IV SOLN: 1000.0000 mg | Freq: Once | INTRAVENOUS | Status: DC

## 2020-05-14 MED ORDER — PREDNISONE 20 MG PO TABS
40.0000 mg | ORAL_TABLET | Freq: Every day | ORAL | Status: AC
  Administered 2020-05-16 – 2020-05-19 (×4): 40 mg via ORAL
  Filled 2020-05-14 (×4): qty 2

## 2020-05-14 MED ORDER — LACTATED RINGERS IV BOLUS (SEPSIS)
1000.0000 mL | Freq: Once | INTRAVENOUS | Status: AC
  Administered 2020-05-14: 1000 mL via INTRAVENOUS

## 2020-05-14 MED ORDER — ACETAMINOPHEN 325 MG PO TABS
650.0000 mg | ORAL_TABLET | Freq: Four times a day (QID) | ORAL | Status: DC | PRN
  Administered 2020-05-14: 650 mg via ORAL
  Filled 2020-05-14: qty 2

## 2020-05-14 MED ORDER — LACTATED RINGERS IV SOLN: INTRAVENOUS | Status: AC

## 2020-05-14 MED ORDER — OXYCODONE HCL 5 MG PO TABS
5.0000 mg | ORAL_TABLET | ORAL | Status: DC | PRN
  Administered 2020-05-14 – 2020-05-24 (×36): 5 mg via ORAL
  Filled 2020-05-14 (×37): qty 1

## 2020-05-14 MED ORDER — VANCOMYCIN HCL 2000 MG/400ML IV SOLN
2000.0000 mg | Freq: Once | INTRAVENOUS | Status: AC
  Administered 2020-05-14: 2000 mg via INTRAVENOUS
  Filled 2020-05-14: qty 400

## 2020-05-14 MED ORDER — SODIUM CHLORIDE 0.9 % IV SOLN
1.0000 g | Freq: Once | INTRAVENOUS | Status: AC
  Administered 2020-05-14: 1 g via INTRAVENOUS
  Filled 2020-05-14: qty 1

## 2020-05-14 MED ORDER — METHYLPREDNISOLONE SODIUM SUCC 40 MG IJ SOLR
40.0000 mg | Freq: Four times a day (QID) | INTRAMUSCULAR | Status: AC
  Administered 2020-05-14 – 2020-05-15 (×4): 40 mg via INTRAVENOUS
  Filled 2020-05-14 (×5): qty 1

## 2020-05-14 MED ORDER — ALBUTEROL SULFATE (2.5 MG/3ML) 0.083% IN NEBU: 2.5000 mg | INHALATION_SOLUTION | RESPIRATORY_TRACT | Status: DC | PRN

## 2020-05-14 NOTE — ED Triage Notes (Signed)
Pt to ED via POV with c/o bilateral foot swelling/redness that is extending up his legs x 1 week. Pt states is on chronic 2L, upon arrival to ED pt is noted to be off his O2, states got stressed out and forgot to bring it, on arrival RA sats 88%, on 4L via [place] pt's RA sats 94-95%.  Pt states has not taken his shoes off x 1 week due to the weeping of bilateral feet and not being able to get his shoes back on if he takes them off. Pt with odor noted to bilateral feet and noted to be dipping onto the floor. Pt with noted wet sounding cough at this time.

## 2020-05-14 NOTE — ED Notes (Signed)
Xeroform, abd pad, and kerlex dressing applied to bilateral lower feet.

## 2020-05-14 NOTE — H&P (Signed)
History and Physical    Jeremiah [name] ZCH:[id number] DOB: 1950/12/13 DOA: 05/14/2020  PCP: Herminio Commons, MD   Patient coming from: Home  I have personally briefly reviewed patient's old medical records in [place]  Chief Complaint: Pain and swelling lower extremity swelling  HPI: Jeremiah [name] is a 70 y.o. male with medical history significant for COPD on home oxygen at 2 L with, HTN, nicotine dependence who presents to the emergency room with a 2-week history of swelling bilateral lower extremities mostly over the right foot.  States he has not taken off his shoes in over a week because he is afraid that he is unable to put it back on.  He denies fever or chills. ED Course: On arrival, he was hypotensive at 102/49 with heart rate 63 but tachypneic at 26, O2 sat 88% on room air and 93% on room fluid of 2 L requiring adjustment to 4 L.  Blood work significant for hyponatremia of 125 and elevated LFTs with AST/ALT 56/45 and alk phos 188.  WBC 12,000.  Troponin normal, BNP normal. EKG as reviewed by me : Sinus rhythm at 69 with nonspecific ST-T wave changes Imaging: Chest x-ray showing chronic changes Foot x-ray showed no evidence of osteo but with recommendations for MRI for further evaluation  Review of Systems: As per HPI otherwise all other systems on review of systems negative.    Past Medical History:  Diagnosis Date  . Asthma   . COPD (chronic obstructive pulmonary disease) (HCC)    O2 dependent - 2L  . GERD (gastroesophageal reflux disease)   . HTN (hypertension)   . Oxygen dependent   . Tobacco dependence     Past Surgical History:  Procedure Laterality Date  . CATARACT EXTRACTION W/PHACO Left 12/09/2019   Procedure: CATARACT EXTRACTION PHACO AND INTRAOCULAR LENS PLACEMENT (IOC) COMPLICATED LEFT 28.78 67:67.2;  Surgeon: Birder Robson, MD;  Location: [facility];  Service: Ophthalmology;  Laterality: Left;  [place]  . CATARACT  EXTRACTION W/PHACO Right 01/27/2020   Procedure: CATARACT EXTRACTION PHACO AND INTRAOCULAR LENS PLACEMENT ([place]) RIGHT VISION BLUE 26.83 02:07.6;  Surgeon: Birder Robson, MD;  Location: [facility];  Service: Ophthalmology;  Laterality: Right;  . HERNIA REPAIR    . NECK SURGERY       reports that he has been smoking cigarettes. He has a 25.00 pack-year smoking history. He has never used smokeless tobacco. He reports current alcohol use. He reports that he does not use drugs.  Allergies  Allergen Reactions  . Penicillins Anaphylaxis, Swelling and Other (See Comments)    Has patient had a PCN reaction causing immediate rash, facial/tongue/throat swelling, SOB or lightheadedness with hypotension: Yes Has patient had a PCN reaction causing severe rash involving mucus membranes or skin necrosis: No Has patient had a PCN reaction that required hospitalization No Has patient had a PCN reaction occurring within the last 10 years: No If all of the above answers are "NO", then may proceed with Cephalosporin use.    Family History  Problem Relation Age of Onset  . Cancer - Colon Father   . Congestive Heart Failure Mother       Prior to Admission medications   Medication Sig Start Date End Date Taking? Authorizing Provider  acetaminophen (TYLENOL) 325 MG tablet Take 2 tablets (650 mg total) by mouth every 6 (six) hours as needed for mild pain (or Fever >/= 101). 08/06/15   Dustin Flock, MD  albuterol (  VENTOLIN HFA) 108 (90 Base) MCG/ACT inhaler Inhale into the lungs every 6 (six) hours as needed for wheezing or shortness of breath.    [provider]  atorvastatin (LIPITOR) 40 MG tablet Take 40 mg by mouth daily.    [provider]  BREO ELLIPTA 100-25 MCG/INH AEPB Take 1 puff by mouth daily. 09/21/16   [provider]  chlorhexidine (PERIDEX) 0.12 % solution 15 mLs by Mouth Rinse route 2 (two) times daily. 10/20/16   Vaughan Basta, MD  diltiazem  (CARDIZEM) 120 MG tablet Take 1 tablet (120 mg total) by mouth 4 (four) times daily. 08/06/15   Dustin Flock, MD  hydrochlorothiazide (HYDRODIURIL) 25 MG tablet Take 25 mg by mouth daily.    [provider]  ipratropium-albuterol (DUONEB) 0.5-2.5 (3) MG/3ML SOLN Take 3 mLs by nebulization every 4 (four) hours. 08/06/15   Dustin Flock, MD  lisinopril (PRINIVIL,ZESTRIL) 10 MG tablet Take 10 mg by mouth daily.    [provider]  montelukast (SINGULAIR) 10 MG tablet Take 10 mg by mouth at bedtime.    [provider]  OXYGEN Inhale 2 L into the lungs at bedtime. And as needed    [provider]  pantoprazole (PROTONIX) 20 MG tablet Take 20 mg by mouth daily.    [provider]  tiotropium (SPIRIVA) 18 MCG inhalation capsule Place 18 mcg into inhaler and inhale daily.    [provider]    Physical Exam: Vitals:   05/14/20 1800 05/14/20 1900 05/14/20 1930 05/14/20 2100  BP: 99/65 (!) 95/47 (!) 96/41 (!) 105/53  Pulse: 71 65 64 63  Resp: (!) 24  (!) 25 (!) 23  Temp:      TempSrc:      SpO2: 95% 93% 100% 93%  Weight:      Height:         Vitals:   05/14/20 1800 05/14/20 1900 05/14/20 1930 05/14/20 2100  BP: 99/65 (!) 95/47 (!) 96/41 (!) 105/53  Pulse: 71 65 64 63  Resp: (!) 24  (!) 25 (!) 23  Temp:      TempSrc:      SpO2: 95% 93% 100% 93%  Weight:      Height:          Constitutional: Alert and oriented x 3 . Not in any apparent distress HEENT:      Head: Normocephalic and atraumatic.         Eyes: PERLA, EOMI, Conjunctivae are normal. Sclera is non-icteric.       Mouth/Throat: Mucous membranes are moist.       Neck: Supple with no signs of meningismus. Cardiovascular: Regular rate and rhythm. No murmurs, gallops, or rubs. 2+ symmetrical distal pulses are present . No JVD. No 3+LE edema Respiratory: Respiratory effort normal .Lungs sounds clear bilaterally. No wheezes, crackles, or rhonchi.  Gastrointestinal: Soft, non  tender, and non distended with positive bowel sounds.  Genitourinary: No CVA tenderness. Musculoskeletal: Bilateral swollen feet. Right foot with large dorsal wound with foul odor and pus. Left foot with erythema.  Neurologic:  Face is symmetric. Moving all extremities. No gross focal neurologic deficits . Skin: Skin is warm, dry. ight foot with large dorsal wound with foul odor and pus Psychiatric: Mood and affect are normal    Labs on Admission: I have personally reviewed following labs and imaging studies  CBC: Recent Labs  Lab 05/14/20 1610  WBC 12.0*  NEUTROABS 10.3*  HGB 13.9  HCT 40.0  MCV  86.2  PLT 361   Basic Metabolic Panel: Recent Labs  Lab 05/14/20 1610  NA 125*  K 3.4*  CL 87*  CO2 27  GLUCOSE 117*  BUN 31*  CREATININE 1.09  CALCIUM 8.5*   GFR: Estimated Creatinine Clearance: 70.2 mL/min (by C-G formula based on SCr of 1.09 mg/dL). Liver Function Tests: Recent Labs  Lab 05/14/20 1610  AST 56*  ALT 45*  ALKPHOS 188*  BILITOT 0.9  PROT 7.1  ALBUMIN 3.5   No results for input(s): LIPASE, AMYLASE in the last 168 hours. No results for input(s): AMMONIA in the last 168 hours. Coagulation Profile: No results for input(s): INR, PROTIME in the last 168 hours. Cardiac Enzymes: No results for input(s): CKTOTAL, CKMB, CKMBINDEX, TROPONINI in the last 168 hours. BNP (last 3 results) No results for input(s): PROBNP in the last 8760 hours. HbA1C: No results for input(s): HGBA1C in the last 72 hours. CBG: No results for input(s): GLUCAP in the last 168 hours. Lipid Profile: No results for input(s): CHOL, HDL, LDLCALC, TRIG, CHOLHDL, LDLDIRECT in the last 72 hours. Thyroid Function Tests: No results for input(s): TSH, T4TOTAL, FREET4, T3FREE, THYROIDAB in the last 72 hours. Anemia Panel: No results for input(s): VITAMINB12, FOLATE, FERRITIN, TIBC, IRON, RETICCTPCT in the last 72 hours. Urine analysis:    Component Value Date/Time   COLORURINE YELLOW (A)  07/28/2015 2122   APPEARANCEUR CLEAR (A) 07/28/2015 2122   APPEARANCEUR Clear 09/12/2013 0920   LABSPEC 1.011 07/28/2015 2122   LABSPEC 1.010 09/12/2013 0920   PHURINE 5.0 07/28/2015 2122   GLUCOSEU 50 (A) 07/28/2015 2122   GLUCOSEU Negative 09/12/2013 0920   HGBUR 1+ (A) 07/28/2015 2122   BILIRUBINUR NEGATIVE 07/28/2015 2122   BILIRUBINUR Negative 09/12/2013 0920   KETONESUR TRACE (A) 07/28/2015 2122   PROTEINUR NEGATIVE 07/28/2015 2122   NITRITE NEGATIVE 07/28/2015 2122   LEUKOCYTESUR NEGATIVE 07/28/2015 2122   LEUKOCYTESUR Negative 09/12/2013 0920    Radiological Exams on Admission: DG Chest 2 View  Result Date: 05/14/2020 CLINICAL DATA:  Cough and shortness of breath EXAM: CHEST - 2 VIEW COMPARISON:  6/10/8 FINDINGS: Cardiac shadow is within normal limits. Aortic calcifications are again identified. Diffuse interstitial scarring and fibrosis is noted stable from the prior exam. Lungs are again hyperinflated. No acute bony abnormality is seen. IMPRESSION: COPD and chronic fibrotic changes.  No acute abnormality noted. Electronically Signed   By: Inez Catalina M.D.   On: 05/14/2020 16:43   DG Foot Complete Right  Result Date: 05/14/2020 CLINICAL DATA:  Concern for possible osteomyelitis. Bilateral foot swelling and redness. The top of the right foot is black. Ulcer to the bottom of the heel. EXAM: RIGHT FOOT COMPLETE - 3+ VIEW COMPARISON:  None. FINDINGS: There is a hallux valgus deformity. No soft tissue gas identified. No bony erosion to suggest osteomyelitis. Deformity of the fifth metatarsal base is likely sequela of previous trauma. IMPRESSION: 1. Deformity of the fifth metatarsal base is likely due to previous trauma. No evidence of osteomyelitis on this study. MRI would be more sensitive for osteomyelitis. Electronically Signed   By: Dorise Bullion III M.D   On: 05/14/2020 20:04     Assessment/Plan 70 year old male with history of COPD on home oxygen at 2 L with, HTN, nicotine  dependence presenting with a 2-week history of swelling right lower extremity.  Hypotensive and tachypneic on arrival requiring higher O2 flow rate.    Cellulitis of right foot    Possible sepsis (HCC) - Redness swelling  right foot with foul odor, leukocytosis, hypotension and tachypnea, pending lactic acid.  X-ray not showing osteomyelitis - IV Vanco and meropenem(history of severe allergic cephalosporin) - IV sepsis fluids - Follow lactic acid - Keep leg elevated - Can consider podiatry consult    Hyponatremia - Suspecting prerenal sodium 125 - IV hydration with normal saline - Follow serum and urine osmolality as well as urine sodium    COPD exacerbation (HCC) Acute on chronic respiratory failure with hypoxia (HCC) - Patient with increased work of breathing, tachypneic and requiring 4 L O2 above his home flow rate of 2 L.  O2 sat was 88 - Schedule as needed nebulized bronchodilator therapy - Titrate oxygen to keep sats above 90    Nicotine dependence -Counseled on quitting  HTN (hypertension) - Patient was hypotensive on admission so we will hold home antihypertensives    Elevated LFTs - Acute hepatitis panel and consider RUQ sonogram.  DVT prophylaxis: Lovenox  Code Status: full code  Family Communication:  none  Disposition Plan: Back to previous home environment Consults called: none  Status:At the time of admission, it appears that the appropriate admission status for this patient is INPATIENT. This is judged to be reasonable and necessary in order to provide the required intensity of service to ensure the patient's safety given the presenting symptoms, physical exam findings, and initial radiographic and laboratory data in the context of their  Comorbid conditions.   Patient requires inpatient status due to high intensity of service, high risk for further deterioration and high frequency of surveillance required.   I certify that at the point of admission it is my  clinical judgment that the patient will require inpatient hospital care spanning beyond [place] MD [facility]     05/14/2020, 9:21 PM

## 2020-05-14 NOTE — ED Notes (Signed)
Date and time results received: 05/14/20 11:45 PM  Test: Covid Critical Value: Positive  Name of Provider Notified: Damita Dunnings MD

## 2020-05-14 NOTE — Progress Notes (Signed)
Code Sepsis initiated @ 2116 PM, [facility] following.

## 2020-05-14 NOTE — Progress Notes (Signed)
PHARMACY -  BRIEF ANTIBIOTIC NOTE   Pharmacy has received consult(s) for Vancomycin from an ED provider.  The patient's profile has been reviewed for ht/wt/allergies/indication/available labs.    One time order(s) placed for Vancomycin 2 gm IV X 1   Further antibiotics/pharmacy consults should be ordered by admitting physician if indicated.                       Thank you, Korinna Tat D 05/14/2020  7:39 PM

## 2020-05-14 NOTE — ED Provider Notes (Signed)
[facility] Emergency Department Provider Note   ____________________________________________   I have reviewed the triage vital signs and the nursing notes.   HISTORY  Chief Complaint Foot Swelling   History limited by: Not Limited   HPI Jeremiah [name] is a 70 y.o. male who presents to the emergency department today because of concern for foot swelling and pain. He states that his symptoms started roughly 2 weeks ago. For the past week however he has not taken off his shoes or socks. He says that the pain is severe. He has had infection in his feet in the past but says it was a number of years ago. He has not had any fevers. Has had some nausea. The patient says he was told he had borderline diabetes but has never been placed on diabetic medication.  Records reviewed. Per medical record review patient has a history of COPD, GERD, HTN.  Past Medical History:  Diagnosis Date  . Asthma   . COPD (chronic obstructive pulmonary disease) (HCC)    O2 dependent - 2L  . GERD (gastroesophageal reflux disease)   . HTN (hypertension)   . Oxygen dependent   . Tobacco dependence     Patient Active Problem List   Diagnosis Date Noted  . Acute respiratory failure ([place]) 10/15/2016  . Acute bronchitis 10/15/2016  . Tobacco dependency 10/15/2016  . COPD exacerbation ([place]) 07/28/2015    Past Surgical History:  Procedure Laterality Date  . CATARACT EXTRACTION W/PHACO Left 12/09/2019   Procedure: CATARACT EXTRACTION PHACO AND INTRAOCULAR LENS PLACEMENT (IOC) COMPLICATED LEFT 70.35 00:93.8;  Surgeon: Birder Robson, MD;  Location: [facility];  Service: Ophthalmology;  Laterality: Left;  [place]  . CATARACT EXTRACTION W/PHACO Right 01/27/2020   Procedure: CATARACT EXTRACTION PHACO AND INTRAOCULAR LENS PLACEMENT ([place]) RIGHT VISION BLUE 26.83 02:07.6;  Surgeon: Birder Robson, MD;  Location: [facility];  Service: Ophthalmology;   Laterality: Right;  . HERNIA REPAIR    . NECK SURGERY      Prior to Admission medications   Medication Sig Start Date End Date Taking? Authorizing Provider  acetaminophen (TYLENOL) 325 MG tablet Take 2 tablets (650 mg total) by mouth every 6 (six) hours as needed for mild pain (or Fever >/= 101). 08/06/15   Dustin Flock, MD  albuterol (VENTOLIN HFA) 108 (90 Base) MCG/ACT inhaler Inhale into the lungs every 6 (six) hours as needed for wheezing or shortness of breath.    [provider]  atorvastatin (LIPITOR) 40 MG tablet Take 40 mg by mouth daily.    [provider]  BREO ELLIPTA 100-25 MCG/INH AEPB Take 1 puff by mouth daily. 09/21/16   [provider]  chlorhexidine (PERIDEX) 0.12 % solution 15 mLs by Mouth Rinse route 2 (two) times daily. 10/20/16   Vaughan Basta, MD  diltiazem (CARDIZEM) 120 MG tablet Take 1 tablet (120 mg total) by mouth 4 (four) times daily. 08/06/15   Dustin Flock, MD  hydrochlorothiazide (HYDRODIURIL) 25 MG tablet Take 25 mg by mouth daily.    [provider]  ipratropium-albuterol (DUONEB) 0.5-2.5 (3) MG/3ML SOLN Take 3 mLs by nebulization every 4 (four) hours. 08/06/15   Dustin Flock, MD  lisinopril (PRINIVIL,ZESTRIL) 10 MG tablet Take 10 mg by mouth daily.    [provider]  montelukast (SINGULAIR) 10 MG tablet Take 10 mg by mouth at bedtime.    [provider]  OXYGEN Inhale 2 L into the lungs at bedtime. And as needed  [provider]  pantoprazole (PROTONIX) 20 MG tablet Take 20 mg by mouth daily.    [provider]  tiotropium (SPIRIVA) 18 MCG inhalation capsule Place 18 mcg into inhaler and inhale daily.    [provider]    Allergies Penicillins  Family History  Problem Relation Age of Onset  . Cancer - Colon Father   . Congestive Heart Failure Mother     Social History Social History   Tobacco Use  . Smoking status: Current Every Day Smoker     Packs/day: 0.50    Years: 50.00    Pack years: 25.00    Types: Cigarettes    Last attempt to quit: 07/28/2015    Years since quitting: 4.8  . Smokeless tobacco: Never Used  . Tobacco comment: started age 25.  Vaping Use  . Vaping Use: Never used  Substance Use Topics  . Alcohol use: Yes    Alcohol/week: 0.0 standard drinks    Comment: beers occasionally  . Drug use: No    Review of Systems Constitutional: No fever/chills Eyes: No visual changes. ENT: No sore throat. Cardiovascular: Denies chest pain. Respiratory: Denies shortness of breath. Gastrointestinal: No abdominal pain. Positive for nausea.  Genitourinary: Negative for dysuria. Musculoskeletal: Positive for bilateral foot pain. Skin: Positive for redness to bilateral feet. Neurological: Negative for headaches, focal weakness or numbness.  ____________________________________________   PHYSICAL EXAM:  VITAL SIGNS: ED Triage Vitals  Enc Vitals Group     BP 05/14/20 1542 (!) 102/49     Pulse Rate 05/14/20 1542 69     Resp 05/14/20 1542 20     Temp 05/14/20 1542 97.7 F (36.5 C)     Temp Source 05/14/20 1542 Oral     SpO2 05/14/20 1542 (!) 88 %     Weight 05/14/20 1551 184 lb (83.5 kg)     Height 05/14/20 1551 6' (1.829 m)     Head Circumference --      Peak Flow --      Pain Score 05/14/20 1551 9   Constitutional: Alert and oriented.  Eyes: Conjunctivae are normal.  ENT      Head: Normocephalic and atraumatic.      Nose: No congestion/rhinnorhea.      Mouth/Throat: Mucous membranes are moist.      Neck: No stridor. Hematological/Lymphatic/Immunilogical: No cervical lymphadenopathy. Cardiovascular: Normal rate, regular rhythm.  No murmurs, rubs, or gallops.  Respiratory: Normal respiratory effort without tachypnea nor retractions. Breath sounds are clear and equal bilaterally. No wheezes/rales/rhonchi. Gastrointestinal: Soft and non tender. No rebound. No guarding.  Genitourinary:  Deferred Musculoskeletal: Bilateral swollen feet. Right foot with large dorsal wound with foul odor and pus. Left foot with erythema.  Neurologic:  Normal speech and language. No gross focal neurologic deficits are appreciated.  Skin: Large wound to dorsal surface of right foot.  Psychiatric: Mood and affect are normal. Speech and behavior are normal. Patient exhibits appropriate insight and judgment.  ____________________________________________    LABS (pertinent positives/negatives)  Trop hs 10 BNP 30.6 CBC wbc 12.0, hgb 13.9, plt 302 CMP na 125, k 3.4, glu 117, cr 1.09  ____________________________________________   EKG  I, Nance Pear, attending physician, personally viewed and interpreted this EKG  EKG Time: 1559 Rate: 69 Rhythm: unclear rhythm, possible sinus rhythm Axis: unclear axis Intervals: qtc 445 QRS: narrow ST changes: no clear ST elevation Impression: severe artifact limits evaluation   ____________________________________________    RADIOLOGY  CXR COPD and chronic fibrotic changes without  acute findings  ____________________________________________   PROCEDURES  Procedures  ____________________________________________   INITIAL IMPRESSION / ASSESSMENT AND PLAN / ED COURSE  Pertinent labs & imaging results that were available during my care of the patient were reviewed by me and considered in my medical decision making (see chart for details).   Patient presented to the emergency department today because of concern for bilateral foot pain that has been getting worse for the past two weeks. On exam the patient has obvious infection to bilateral feet with large wound over the dorsal surface of the right foot. X-ray was obtained which did not show evidence of osteo. Blood work did show hyponatremia as well, it appears patient has history of slightly low sodium in the past. Will start IVFs. Will start patient on antibiotics will plan on  admission. Discussed findings and plan with patient.    ____________________________________________   FINAL CLINICAL IMPRESSION(S) / ED DIAGNOSES  Final diagnoses:  Cellulitis, unspecified cellulitis site  Hyponatremia     Note: This dictation was prepared with Dragon dictation. Any transcriptional errors that result from this process are unintentional     Nance Pear, MD 05/14/20 2121

## 2020-05-15 DIAGNOSIS — L03115 Cellulitis of right lower limb: Secondary | ICD-10-CM | POA: Diagnosis not present

## 2020-05-15 DIAGNOSIS — L0390 Cellulitis, unspecified: Secondary | ICD-10-CM | POA: Diagnosis present

## 2020-05-15 LAB — CBC
HCT: 35.5 % — ABNORMAL LOW (ref 39.0–52.0)
Hemoglobin: 12.2 g/dL — ABNORMAL LOW (ref 13.0–17.0)
MCH: 29.9 pg (ref 26.0–34.0)
MCHC: 34.4 g/dL (ref 30.0–36.0)
MCV: 87 fL (ref 80.0–100.0)
Platelets: 274 10*3/uL (ref 150–400)
RBC: 4.08 MIL/uL — ABNORMAL LOW (ref 4.22–5.81)
RDW: 14.6 % (ref 11.5–15.5)
WBC: 11.6 10*3/uL — ABNORMAL HIGH (ref 4.0–10.5)
nRBC: 0 % (ref 0.0–0.2)

## 2020-05-15 LAB — OSMOLALITY, URINE: Osmolality, Ur: 434 mOsm/kg (ref 300–900)

## 2020-05-15 LAB — SODIUM, URINE, RANDOM: Sodium, Ur: 40 mmol/L

## 2020-05-15 LAB — D-DIMER, QUANTITATIVE: D-Dimer, Quant: 0.47 ug/mL-FEU (ref 0.00–0.50)

## 2020-05-15 LAB — PROCALCITONIN: Procalcitonin: <0.1 ng/mL

## 2020-05-15 LAB — LACTIC ACID, PLASMA: Lactic Acid, Venous: 1 mmol/L (ref 0.5–1.9)

## 2020-05-15 LAB — C-REACTIVE PROTEIN: CRP: 8.6 mg/dL — ABNORMAL HIGH (ref <1.0)

## 2020-05-15 LAB — FERRITIN: Ferritin: 56 ng/mL (ref 24–336)

## 2020-05-15 MED ORDER — SODIUM CHLORIDE 0.9 % IV SOLN
100.0000 mg | Freq: Every day | INTRAVENOUS | Status: AC
  Administered 2020-05-16 – 2020-05-19 (×4): 100 mg via INTRAVENOUS
  Filled 2020-05-15 (×4): qty 20

## 2020-05-15 MED ORDER — NICOTINE 21 MG/24HR TD PT24
21.0000 mg | MEDICATED_PATCH | Freq: Every day | TRANSDERMAL | Status: DC
  Administered 2020-05-15 – 2020-05-25 (×11): 21 mg via TRANSDERMAL
  Filled 2020-05-15 (×11): qty 1

## 2020-05-15 MED ORDER — ALBUTEROL SULFATE HFA 108 (90 BASE) MCG/ACT IN AERS
2.0000 | INHALATION_SPRAY | Freq: Four times a day (QID) | RESPIRATORY_TRACT | Status: DC | PRN
  Administered 2020-05-16 – 2020-05-25 (×8): 2 via RESPIRATORY_TRACT
  Filled 2020-05-15 (×2): qty 6.7

## 2020-05-15 MED ORDER — ACETAMINOPHEN 325 MG PO TABS: 650.0000 mg | ORAL_TABLET | Freq: Four times a day (QID) | ORAL | Status: DC | PRN

## 2020-05-15 MED ORDER — VANCOMYCIN HCL IN DEXTROSE 1-5 GM/200ML-% IV SOLN
1000.0000 mg | Freq: Two times a day (BID) | INTRAVENOUS | Status: AC
  Administered 2020-05-15 – 2020-05-20 (×12): 1000 mg via INTRAVENOUS
  Filled 2020-05-15 (×13): qty 200

## 2020-05-15 MED ORDER — AZTREONAM 2 G IJ SOLR
2.0000 g | Freq: Three times a day (TID) | INTRAMUSCULAR | Status: DC
  Filled 2020-05-15 (×2): qty 2

## 2020-05-15 MED ORDER — ACETAMINOPHEN 325 MG PO TABS
650.0000 mg | ORAL_TABLET | Freq: Four times a day (QID) | ORAL | Status: DC | PRN
  Administered 2020-05-15 – 2020-05-25 (×21): 650 mg via ORAL
  Filled 2020-05-15 (×22): qty 2

## 2020-05-15 MED ORDER — SODIUM CHLORIDE 0.9 % IV SOLN
1.0000 g | Freq: Three times a day (TID) | INTRAVENOUS | Status: DC
  Administered 2020-05-15: 1 g via INTRAVENOUS
  Filled 2020-05-15: qty 1

## 2020-05-15 MED ORDER — SODIUM CHLORIDE 0.9 % IV SOLN
200.0000 mg | Freq: Once | INTRAVENOUS | Status: AC
  Administered 2020-05-15: 200 mg via INTRAVENOUS
  Filled 2020-05-15: qty 200

## 2020-05-15 MED ORDER — TIOTROPIUM BROMIDE MONOHYDRATE 18 MCG IN CAPS
18.0000 ug | ORAL_CAPSULE | Freq: Every day | RESPIRATORY_TRACT | Status: DC
  Administered 2020-05-16 – 2020-05-25 (×10): 18 ug via RESPIRATORY_TRACT
  Filled 2020-05-15: qty 5

## 2020-05-15 MED ORDER — PANTOPRAZOLE SODIUM 20 MG PO TBEC
20.0000 mg | DELAYED_RELEASE_TABLET | Freq: Every day | ORAL | Status: DC
  Administered 2020-05-16 – 2020-05-25 (×10): 20 mg via ORAL
  Filled 2020-05-15 (×10): qty 1

## 2020-05-15 MED ORDER — FLUTICASONE FUROATE-VILANTEROL 100-25 MCG/INH IN AEPB
1.0000 | INHALATION_SPRAY | Freq: Every day | RESPIRATORY_TRACT | Status: DC
  Administered 2020-05-16 – 2020-05-25 (×10): 1 via RESPIRATORY_TRACT
  Filled 2020-05-15: qty 28

## 2020-05-15 MED ORDER — DAKINS (1/4 STRENGTH) 0.125 % EX SOLN
Freq: Every day | CUTANEOUS | Status: AC
  Administered 2020-05-16: 1 via TOPICAL
  Filled 2020-05-15 (×2): qty 473

## 2020-05-15 MED ORDER — ENOXAPARIN SODIUM 40 MG/0.4ML ~~LOC~~ SOLN
40.0000 mg | SUBCUTANEOUS | Status: DC
  Administered 2020-05-15 – 2020-05-25 (×11): 40 mg via SUBCUTANEOUS
  Filled 2020-05-15 (×11): qty 0.4

## 2020-05-15 MED ORDER — MONTELUKAST SODIUM 10 MG PO TABS
10.0000 mg | ORAL_TABLET | Freq: Every day | ORAL | Status: DC
Start: 2020-05-15 — End: 2020-05-25
  Administered 2020-05-15 – 2020-05-24 (×10): 10 mg via ORAL
  Filled 2020-05-15 (×11): qty 1

## 2020-05-15 MED ORDER — ATORVASTATIN CALCIUM 20 MG PO TABS
40.0000 mg | ORAL_TABLET | Freq: Every day | ORAL | Status: DC
  Administered 2020-05-16 – 2020-05-25 (×10): 40 mg via ORAL
  Filled 2020-05-15 (×10): qty 2

## 2020-05-15 MED ORDER — SODIUM CHLORIDE 0.9 % IV SOLN
2.0000 g | Freq: Three times a day (TID) | INTRAVENOUS | Status: AC
  Administered 2020-05-15 – 2020-05-20 (×14): 2 g via INTRAVENOUS
  Filled 2020-05-15 (×15): qty 2

## 2020-05-15 NOTE — Evaluation (Signed)
Physical Therapy Evaluation Patient Details Name: Jeremiah [name] MRN: [id number] DOB: [birth year] Today's Date: 05/15/2020   History of Present Illness  Jeremiah [name] is a 70 y.o. male  who presents to the emergency room with a 2-week history of swelling bilateral lower extremities mostly over the right foot. States he has not taken off his shoes in over a week because he is afraid that he is unable to put it back on. PMH: significant for COPD on home oxygen at 2 L with, HTN, nicotine dependence.  Clinical Impression  Pt admitted with above diagnosis. Pt currently with functional limitations due to the deficits listed below (see "PT Problem List"). Upon entry, pt in bed, awake and agreeable to participate. The pt is alert, pleasant, interactive, and able to provide info regarding prior level of function, both in tolerance and independence. No OOB exam performed due to pending MRI and podiatry consult. MMT reveals weakness of BLE legs and bilat shoulder (chronic) however ot should be able to use RW effective based on exam. Pt on 5L O2 throughout, 2L chronic. Patient's performance this date reveals decreased ability, independence, and tolerance in performing all basic mobility required for performance of activities of daily living. Pt requires additional DME, close physical assistance, and cues for safe participate in mobility. Pt clearly struggling to care for self (foot care) due to chronic ADL difficulty, but also self admittedly does nto like to ask people for help. A STR stay could provide excellent rehab opportunity prior to return to home setting. Pt will benefit from skilled PT intervention to increase independence and safety with basic mobility in preparation for discharge to the venue listed below.       Follow Up Recommendations SNF;Supervision - Intermittent;Supervision for mobility/OOB    Equipment Recommendations  Rolling walker with 5" wheels;3in1 (PT)    Recommendations for  Other Services       Precautions / Restrictions Precautions Precautions: Fall Precaution Comments: weeping BLE, MRI, podiatry pending Restrictions Weight Bearing Restrictions:  (none yet, likely on the horizon)      Mobility  Bed Mobility               General bed mobility comments: deferred; studeis still pending, MD asked that gross mobility be deferred this date.    Transfers                    Ambulation/Gait                Stairs            Wheelchair Mobility    Modified Rankin (Stroke Patients Only)       Balance Overall balance assessment:  (not assessed; minimal baseline balance deficits per patient)                                           Pertinent Vitals/Pain Pain Assessment:  (8/10 Rt foot; 6/10 Left foot)    Home Living Family/patient expects to be discharged to:: Private residence Living Arrangements: Non-relatives/Friends (Annette, [name]'s BF Donnie, and Annette's GDTR) Available Help at Discharge: Friend(s) Type of Home: Mobile home Home Access: Stairs to enter Entrance Stairs-Rails: Right;Left Entrance Stairs-Number of Steps: 10 at front, bilat rails; 2 at back door, but unable to access back door. Home Layout: One level Home Equipment: Grab bars - toilet Additional Comments: unable to find walkers;  reports no balance issues typically; lots of close calls, but few falls;    Prior Function Level of Independence: Needs assistance      ADL's / Homemaking Assistance Needed: cannot reach his feet due to chronic lung disease (feet in very poor condition), sleeps in bed; 2L O2 baseline at all times, uses a concentrator. Has a portable concentrator as well, but does not use it often.        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 LUE Deficits / Details: chronic rotary cuff dysfunction; 5/5 elbow grossly, grip strongl no active flexion, habitual RUE assisted AA/ROM of LUE    Lower Extremity Assessment Lower Extremity Assessment: Generalized weakness;RLE deficits/detail;LLE deficits/detail RLE Deficits / Details: ankle ROM WNL, SLR 4+/5, hip/knee extension 4+/5, heel slide 3-/5 (very limited range) LLE Deficits / Details: ankle ROM WNL, SLR 4+/5, hip/knee extension 4+/5, heel slide 3-/5 (very limited range)       Communication      Cognition Arousal/Alertness: Awake/alert Behavior During Therapy: WFL for tasks assessed/performed Overall Cognitive Status: Within Functional Limits for tasks assessed                                 General Comments: chronic baseline intermitten stutter      General Comments      Exercises     Assessment/Plan    PT Assessment Patient needs continued PT services  PT Problem List Decreased strength;Decreased activity tolerance;Decreased mobility;Decreased knowledge of precautions       PT Treatment Interventions DME instruction;Balance training;Stair training;Functional mobility training;Therapeutic activities;Therapeutic exercise;Patient/family education;Gait training    PT Goals (Current goals can be found in the Care Plan section)  Acute Rehab PT Goals Patient Stated Goal: regain strength,maintain indpeendence with basic mobility. PT Goal Formulation: With patient Time For Goal Achievement: 05/29/20 Potential to Achieve Goals: Fair    Frequency Min 2X/week   Barriers to discharge Inaccessible home environment 10 steps to enter home    Co-evaluation               AM-PAC PT "6 Clicks" Mobility  Outcome Measure Help needed turning from your back to your side while in a flat bed without using bedrails?: A Lot Help needed moving from lying on your back to sitting on the  side of a flat bed without using bedrails?: A Lot Help needed moving to and from a bed to a chair (including a wheelchair)?: A Lot Help needed standing up from a chair using your arms (e.g., wheelchair or bedside chair)?: A Lot Help needed to walk in hospital room?: A Lot Help needed climbing 3-5 steps with a railing? : A Lot 6 Click Score: 12    End of Session Equipment Utilized During Treatment: Oxygen Activity Tolerance: Patient tolerated treatment well;Treatment limited secondary to medical complications (Comment) Patient left: in bed;with call bell/phone within reach Nurse Communication: Mobility status PT Visit Diagnosis: Muscle weakness (generalized) (M62.81);Other abnormalities of gait and mobility (R26.89);Difficulty in walking, not elsewhere classified (R26.2)    Time: 9983-3825 PT Time Calculation (min) (ACUTE ONLY): 30 min   Charges:   PT Evaluation $PT Eval Moderate Complexity: 1 Mod         4:28 PM, 05/15/20 Etta Grandchild, PT, DPT  Physical Therapist - [facility]  [phone] ([place])    Aryan Bello C 05/15/2020, 4:24 PM

## 2020-05-15 NOTE — ED Notes (Signed)
Patient called out for IV beeping, fluids stopped per order.  Patient currently comfortable and "feels pretty good" after seeing PT.

## 2020-05-15 NOTE — ED Notes (Signed)
Patient given phone to be scanned for MRI.

## 2020-05-15 NOTE — Consult Note (Signed)
Remdesivir - Pharmacy Brief Note   O:  ALT: 50 SpO2: 89% on 4 L [place]   A/P:  Remdesivir 200 mg IVPB once followed by 100 mg IVPB daily x 4 days.   Dorothe Pea, PharmD, BCPS Clinical Pharmacist  05/15/2020 2:50 PM

## 2020-05-15 NOTE — Progress Notes (Signed)
PROGRESS NOTE    Jeremiah [name]  TAV:[id number] DOB: Jan 20, 1951 DOA: 05/14/2020 PCP: Herminio Commons, MD   Chief Complaint  Patient presents with  . Foot Swelling   Brief Narrative: 70 year old male with COPD chronic hypoxia on 2 L home oxygen, hypertension, nicotine dependence presented with 2-week history of swelling of bilateral lower extremities mostly in the right foot. In the ED hypotensive with soft blood pressure tachypneic 26 hypoxic in 80s on room air 93% on 4 L blood work showed hyponatremia 125, elevated LFTs leukocytosis troponin and BNP normal EKG sinus rhythm EKG with chronic changes x-ray of the foot no evidence of osteomyelitis. Patient was placed on broad-spectrum antibiotics and admitted, his COVID-19 came back positive during night.  Subjective:  89% to 90% saturation intermittent tachypnea 19-25, blood pressure is stable Leukocytosis improving lactic acid normal C/o chest congestino, cough and shortness of breath. He uis on 4l [place] at pusle ox at 90%, normally on 2l at home Has not talked to wife and okay with [place] updating her later today  Assessment & Plan:  Cellulitis of right foot with suspected sepsis normal lactic acid.  Continue vancomycin and aztreonam- pharmacy dosing.  X-ray foot did not show osteomyelitis, MRI was recommended, will obtain Podiatry consult, notified Dr Vickki Muff.Blood culture sent. Clinically stable.  Acute on chronic respiratory failure with hypoxia from COPD exacerbation, COVID-19 came back positive: Patient is on supplemental oxygen, bronchodilators.  Patient is on Solu-Medrol, will check inflammatory markers CRP procalcitonin and D-dimer, he appears symptomatic and is high risk so will start remdesivir. CXR shows lungs are hyperinflated and has chronic fibrotic changes and COPD.   COVID-19 Labs Recent Labs    05/15/20 1147  FERRITIN 104   Lab Results  Component Value Date   SARSCOV2NAA POSITIVE (A) 05/14/2020   [place]  NEGATIVE 01/23/2020   [place] NEGATIVE 12/05/2019    COVID-19 Labs Recent Labs    05/15/20 1147  FERRITIN 50    Lab Results  Component Value Date   SARSCOV2NAA POSITIVE (A) 05/14/2020   SARSCOV2NAA NEGATIVE 01/23/2020   [place] NEGATIVE 12/05/2019   Nicotine dependence: Counseled on quitting on admission.  Hyponatremia: Sodium improving with IV fluids.  Continue Recent Labs  Lab 05/14/20 1610 05/14/20 2243  NA 125* 127*   Abnormal LFTs: Likely from sepsis.  Improving.  Monitor Recent Labs  Lab 05/14/20 1610 05/14/20 2243  AST 56* 50*  ALT 45* 41  ALKPHOS 188* 158*  BILITOT 0.9 0.9  PROT 7.1 6.1*  ALBUMIN 3.5 3.0*  INR  --  1.0   Nutrition: Diet Order            Diet regular Room service appropriate? Yes; Fluid consistency: Thin  Diet effective now                 Body mass index is 24.95 kg/m.  DVT prophylaxis: enoxaparin (LOVENOX) injection 40 mg Start: 05/15/20 0800 Code Status:   Code Status: Full Code Family Communication: plan of care discussed with patient at bedside.  Status is: Inpatient  Remains inpatient appropriate because:IV treatments appropriate due to intensity of illness or inability to take PO and Inpatient level of care appropriate due to severity of illness  Dispo: The patient is from: Home              Anticipated d/c is to: Home              Anticipated d/c date is: > 3 days  Patient currently is not medically stable to d/c.  Consultants:see note  Procedures:see note  Culture/Microbiology    Component Value Date/Time   SDES BLOOD LEFT ANTECUBITAL 05/14/2020 2008   SDES BLOOD RIGHT ANTECUBITAL 05/14/2020 2008   SPECREQUEST  05/14/2020 2008    BOTTLES DRAWN AEROBIC AND ANAEROBIC Blood Culture results may not be optimal due to an inadequate volume of blood received in culture bottles   SPECREQUEST  05/14/2020 2008    BOTTLES DRAWN AEROBIC AND ANAEROBIC Blood Culture results may not be optimal due to an  inadequate volume of blood received in culture bottles   CULT  05/14/2020 2008    NO GROWTH < 12 HOURS Performed at [facility], [address] [place], [place] [address]    CULT  05/14/2020 2008    NO GROWTH < 12 HOURS Performed at [facility], [address] [place], [place] [address]    REPTSTATUS PENDING 05/14/2020 2008   REPTSTATUS PENDING 05/14/2020 2008    Other culture-see note  Medications: Scheduled Meds: . enoxaparin (LOVENOX) injection  40 mg Subcutaneous Q24H  . ipratropium-albuterol  3 mL Nebulization Q6H  . methylPREDNISolone (SOLU-MEDROL) injection  40 mg Intravenous Q6H   Followed by  . [START ON 05/16/2020] predniSONE  40 mg Oral Q breakfast  . nicotine  21 mg Transdermal Daily   Continuous Infusions: . aztreonam    . lactated ringers 150 mL/hr at 05/15/20 0716  . vancomycin Stopped (05/15/20 1252)    Antimicrobials: Anti-infectives (From admission, onward)   Start     Dose/Rate Route Frequency Ordered Stop   05/15/20 1400  aztreonam (AZACTAM) injection 2 g  Status:  Discontinued        2 g Intramuscular Every 8 hours 05/15/20 1054 05/15/20 1055   05/15/20 1400  aztreonam (AZACTAM) 2 g in sodium chloride 0.9 % 100 mL IVPB        2 g 200 mL/hr over 30 Minutes Intravenous Every 8 hours 05/15/20 1055     05/15/20 0800  vancomycin (VANCOCIN) IVPB 1000 mg/200 mL premix        1,000 mg 200 mL/hr over 60 Minutes Intravenous Every 12 hours 05/15/20 0024     05/15/20 0730  meropenem (MERREM) 1 g in sodium chloride 0.9 % 100 mL IVPB  Status:  Discontinued        1 g 200 mL/hr over 30 Minutes Intravenous Every 8 hours 05/15/20 0024 05/15/20 1054   05/14/20 2130  vancomycin (VANCOCIN) IVPB 1000 mg/200 mL premix  Status:  Discontinued        1,000 mg 200 mL/hr over 60 Minutes Intravenous  Once 05/14/20 2118 05/15/20 0020   05/14/20 2130  meropenem (MERREM) 1 g in sodium chloride 0.9 % 100 mL IVPB        1 g 200 mL/hr over 30 Minutes  Intravenous  Once 05/14/20 2118 05/15/20 0000   05/14/20 1945  vancomycin (VANCOCIN) IVPB 1000 mg/200 mL premix  Status:  Discontinued        1,000 mg 200 mL/hr over 60 Minutes Intravenous  Once 05/14/20 1933 05/14/20 1938   05/14/20 1945  vancomycin (VANCOCIN) IVPB 1000 mg/200 mL premix  Status:  Discontinued        1,000 mg 200 mL/hr over 60 Minutes Intravenous  Once 05/14/20 1933 05/14/20 1938   05/14/20 1945  vancomycin (VANCOREADY) IVPB 2000 mg/400 mL        2,000 mg 200 mL/hr over 120 Minutes Intravenous  Once 05/14/20 1939  05/14/20 2210     Objective: Vitals: Today's Vitals   05/15/20 1133 05/15/20 1200 05/15/20 1400 05/15/20 1424  BP:  127/71 (!) 120/108   Pulse:  91 85   Resp:      Temp:      TempSrc:      SpO2:  90% 93%   Weight:      Height:      PainSc: 6    8     Intake/Output Summary (Last 24 hours) at 05/15/2020 1441 Last data filed at 05/15/2020 0440 Gross per 24 hour  Intake 172.65 ml  Output -  Net 172.65 ml   Filed Weights   05/14/20 1551  Weight: 83.5 kg   Weight change:   Intake/Output from previous day: 01/07 0701 - 01/08 0700 In: 172.7 [I.V.:172.7] Out: -  Intake/Output this shift: No intake/output data recorded.  Examination: General exam: AAOx3, not in ditress, weak appearing. HEENT:Oral mucosa moist, Ear/Nose WNL grossly,dentition normal. Respiratory system: bilaterally diminished throughout,no use of accessory muscle, non tender. Cardiovascular system: S1 & S2 +, regular, No JVD. Gastrointestinal system: Abdomen soft, NT,ND, BS+. Nervous System:Alert, awake, moving extremities and grossly nonfocal Extremities: b/l leg edema++, long toe nails, weeping and chronic sklin changes w/ erythema on both legs- dressing + placed last night for weeping per nursing, Skin: No rashes,no icterus. MSK: Normal muscle bulk,tone, power  Data Reviewed: I have personally reviewed following labs and imaging studies CBC: Recent Labs  Lab 05/14/20 1610  05/14/20 2243 05/15/20 0112  WBC 12.0* 13.1* 11.6*  NEUTROABS 10.3* 11.1*  --   HGB 13.9 12.5* 12.2*  HCT 40.0 37.0* 35.5*  MCV 86.2 87.3 87.0  PLT 302 280 737   Basic Metabolic Panel: Recent Labs  Lab 05/14/20 1610 05/14/20 2243  NA 125* 127*  K 3.4* 4.0  CL 87* 89*  CO2 27 27  GLUCOSE 117* 96  BUN 31* 31*  CREATININE 1.09 1.00  CALCIUM 8.5* 8.1*   GFR: Estimated Creatinine Clearance: 76.5 mL/min (by C-G formula based on SCr of 1 mg/dL). Liver Function Tests: Recent Labs  Lab 05/14/20 1610 05/14/20 2243  AST 56* 50*  ALT 45* 41  ALKPHOS 188* 158*  BILITOT 0.9 0.9  PROT 7.1 6.1*  ALBUMIN 3.5 3.0*   No results for input(s): LIPASE, AMYLASE in the last 168 hours. No results for input(s): AMMONIA in the last 168 hours. Coagulation Profile: Recent Labs  Lab 05/14/20 2243  INR 1.0   Cardiac Enzymes: No results for input(s): CKTOTAL, CKMB, CKMBINDEX, TROPONINI in the last 168 hours. BNP (last 3 results) No results for input(s): PROBNP in the last 8760 hours. HbA1C: No results for input(s): HGBA1C in the last 72 hours. CBG: No results for input(s): GLUCAP in the last 168 hours. Lipid Profile: No results for input(s): CHOL, HDL, LDLCALC, TRIG, CHOLHDL, LDLDIRECT in the last 72 hours. Thyroid Function Tests: No results for input(s): TSH, T4TOTAL, FREET4, T3FREE, THYROIDAB in the last 72 hours. Anemia Panel: Recent Labs    05/15/20 1147  FERRITIN 56   Sepsis Labs: Recent Labs  Lab 05/14/20 2243 05/14/20 2333 05/15/20 1147  PROCALCITON  --   --  <0.10  LATICACIDVEN 1.3 1.0  --     Recent Results (from the past 240 hour(s))  Blood culture (routine x 2)     Status: None (Preliminary result)   Collection Time: 05/14/20  8:08 PM   Specimen: BLOOD  Result Value Ref Range Status   Specimen Description BLOOD LEFT ANTECUBITAL  Final   Special Requests   Final    BOTTLES DRAWN AEROBIC AND ANAEROBIC Blood Culture results may not be optimal due to an  inadequate volume of blood received in culture bottles   Culture   Final    NO GROWTH < 12 HOURS Performed at [facility], [address] [place], [place] [address]    Report Status PENDING  Incomplete  Blood culture (routine x 2)     Status: None (Preliminary result)   Collection Time: 05/14/20  8:08 PM   Specimen: BLOOD  Result Value Ref Range Status   Specimen Description BLOOD RIGHT ANTECUBITAL  Final   Special Requests   Final    BOTTLES DRAWN AEROBIC AND ANAEROBIC Blood Culture results may not be optimal due to an inadequate volume of blood received in culture bottles   Culture   Final    NO GROWTH < 12 HOURS Performed at [facility], [address] [place], [place] [address]    Report Status PENDING  Incomplete  Resp Panel by RT-PCR (Flu A&B, Covid)     Status: Abnormal   Collection Time: 05/14/20 10:43 PM  Result Value Ref Range Status   SARS Coronavirus 2 by RT PCR POSITIVE (A) NEGATIVE Final    Comment: RESULT CALLED TO, READ BACK BY AND VERIFIED WITH: KASSIE FELTS ON 05/14/20 AT 2344 QSD (NOTE) SARS-CoV-2 target nucleic acids are DETECTED.  The SARS-CoV-2 RNA is generally detectable in upper respiratory specimens during the acute phase of infection. Positive results are indicative of the presence of the identified virus, but do not rule out bacterial infection or co-infection with other pathogens not detected by the test. Clinical correlation with patient history and other diagnostic information is necessary to determine patient infection status. The expected result is Negative.  Fact Sheet for Patients: EntrepreneurPulse.com.au  Fact Sheet for Healthcare Providers: IncredibleEmployment.be  This test is not yet approved or cleared by the [place] FDA and  has been authorized for detection and/or diagnosis of SARS-CoV-2 by FDA under an Emergency Use Authorization (EUA).  This EUA will remain in effect  (meaning this test can b e used) for the duration of  the COVID-19 declaration under Section 564(b)(1) of the Act, 21 U.S.C. section 360bbb-3(b)(1), unless the authorization is terminated or revoked sooner.     Influenza A by PCR NEGATIVE NEGATIVE Final   Influenza B by PCR NEGATIVE NEGATIVE Final    Comment: (NOTE) The Xpert Xpress SARS-CoV-2/FLU/RSV plus assay is intended as an aid in the diagnosis of influenza from Nasopharyngeal swab specimens and should not be used as a sole basis for treatment. Nasal washings and aspirates are unacceptable for Xpert Xpress SARS-CoV-2/FLU/RSV testing.  Fact Sheet for Patients: EntrepreneurPulse.com.au  Fact Sheet for Healthcare Providers: IncredibleEmployment.be  This test is not yet approved or cleared by the [place] FDA and has been authorized for detection and/or diagnosis of SARS-CoV-2 by FDA under an Emergency Use Authorization (EUA). This EUA will remain in effect (meaning this test can be used) for the duration of the COVID-19 declaration under Section 564(b)(1) of the Act, 21 U.S.C. section 360bbb-3(b)(1), unless the authorization is terminated or revoked.  Performed at [facility], [address] [place], [place] [address]      Radiology Studies: DG Chest 2 View  Result Date: 05/14/2020 CLINICAL DATA:  Cough and shortness of breath EXAM: CHEST - 2 VIEW COMPARISON:  6/10/8 FINDINGS: Cardiac shadow is within normal limits. Aortic calcifications are again identified.  Diffuse interstitial scarring and fibrosis is noted stable from the prior exam. Lungs are again hyperinflated. No acute bony abnormality is seen. IMPRESSION: COPD and chronic fibrotic changes.  No acute abnormality noted. Electronically Signed   By: Inez Catalina M.D.   On: 05/14/2020 16:43   DG Foot Complete Right  Result Date: 05/14/2020 CLINICAL DATA:  Concern for possible osteomyelitis. Bilateral foot swelling and  redness. The top of the right foot is black. Ulcer to the bottom of the heel. EXAM: RIGHT FOOT COMPLETE - 3+ VIEW COMPARISON:  None. FINDINGS: There is a hallux valgus deformity. No soft tissue gas identified. No bony erosion to suggest osteomyelitis. Deformity of the fifth metatarsal base is likely sequela of previous trauma. IMPRESSION: 1. Deformity of the fifth metatarsal base is likely due to previous trauma. No evidence of osteomyelitis on this study. MRI would be more sensitive for osteomyelitis. Electronically Signed   By: Dorise Bullion III M.D   On: 05/14/2020 20:04     LOS: 1 day   Antonieta Pert, MD [facility]  05/15/2020, 2:41 PM

## 2020-05-15 NOTE — ED Notes (Signed)
Pt wanted to have medication reconciliation done with home meds. Pt has bag of home meds at bedside. Called pharmacy to pass along this msg. Communicated to pt that this msg was passed along to pharmacy and they will send someone.

## 2020-05-15 NOTE — Consult Note (Signed)
ORTHOPAEDIC CONSULTATION  REQUESTING PHYSICIAN: Antonieta Pert, MD  Chief Complaint: Right foot wound and infection  HPI: Jeremiah [name] is a 70 y.o. male who complains of worsening swelling redness to his right foot.  For the last week he got both of his shoes on and was concerned about removing them.  He was worried he would not be able to put his shoes back on secondary to swelling.  He has had swelling in his feet for the last couple weeks now.  Denies diabetes but was told he was borderline diabetic.  History of COPD.  Currently COVID positive.  Past Medical History:  Diagnosis Date  . Asthma   . COPD (chronic obstructive pulmonary disease) (HCC)    O2 dependent - 2L  . GERD (gastroesophageal reflux disease)   . HTN (hypertension)   . Oxygen dependent   . Tobacco dependence    Past Surgical History:  Procedure Laterality Date  . CATARACT EXTRACTION W/PHACO Left 12/09/2019   Procedure: CATARACT EXTRACTION PHACO AND INTRAOCULAR LENS PLACEMENT ([facility]) COMPLICATED LEFT 32.44 01:02.7;  Surgeon: Birder Robson, MD;  Location: [place];  Service: Ophthalmology;  Laterality: Left;  [place]  . CATARACT EXTRACTION W/PHACO Right 01/27/2020   Procedure: CATARACT EXTRACTION PHACO AND INTRAOCULAR LENS PLACEMENT ([facility]) RIGHT VISION BLUE 26.83 02:07.6;  Surgeon: Birder Robson, MD;  Location: [place];  Service: Ophthalmology;  Laterality: Right;  . HERNIA REPAIR    . NECK SURGERY     Social History   Socioeconomic History  . Marital status: Divorced    Spouse name: Not on file  . Number of children: Not on file  . Years of education: Not on file  . Highest education level: Not on file  Occupational History  . Not on file  Tobacco Use  . Smoking status: Current Every Day Smoker    Packs/day: 0.50    Years: 50.00    Pack years: 25.00    Types: Cigarettes    Last attempt to quit: 07/28/2015    Years since quitting: 4.8  . Smokeless tobacco: Never  Used  . Tobacco comment: started age 21.  Vaping Use  . Vaping Use: Never used  Substance and Sexual Activity  . Alcohol use: Yes    Alcohol/week: 0.0 standard drinks    Comment: beers occasionally  . Drug use: No  . Sexual activity: Not on file  Other Topics Concern  . Not on file  Social History Narrative   Lives at home with wife.   Social Determinants of Health   Financial Resource Strain: Not on file  Food Insecurity: Not on file  Transportation Needs: Not on file  Physical Activity: Not on file  Stress: Not on file  Social Connections: Not on file   Family History  Problem Relation Age of Onset  . Cancer - Colon Father   . Congestive Heart Failure Mother    Allergies  Allergen Reactions  . Penicillins Anaphylaxis, Swelling and Other (See Comments)    Has patient had a PCN reaction causing immediate rash, facial/tongue/throat swelling, SOB or lightheadedness with hypotension: Yes Has patient had a PCN reaction causing severe rash involving mucus membranes or skin necrosis: No Has patient had a PCN reaction that required hospitalization No Has patient had a PCN reaction occurring within the last 10 years: No If all of the above answers are "NO", then may proceed with Cephalosporin use.   Prior to Admission medications   Medication Sig Start Date End  Date Taking? Authorizing Provider  acetaminophen (TYLENOL) 325 MG tablet Take 2 tablets (650 mg total) by mouth every 6 (six) hours as needed for mild pain (or Fever >/= 101). 08/06/15   Dustin Flock, MD  albuterol (VENTOLIN HFA) 108 (90 Base) MCG/ACT inhaler Inhale into the lungs every 6 (six) hours as needed for wheezing or shortness of breath.    [provider]  atorvastatin (LIPITOR) 40 MG tablet Take 40 mg by mouth daily.    [provider]  BREO ELLIPTA 100-25 MCG/INH AEPB Take 1 puff by mouth daily. 09/21/16   [provider]  chlorhexidine (PERIDEX) 0.12 % solution 15 mLs by Mouth Rinse  route 2 (two) times daily. 10/20/16   Vaughan Basta, MD  diltiazem (CARDIZEM) 120 MG tablet Take 1 tablet (120 mg total) by mouth 4 (four) times daily. 08/06/15   Dustin Flock, MD  hydrochlorothiazide (HYDRODIURIL) 25 MG tablet Take 25 mg by mouth daily.    [provider]  ipratropium-albuterol (DUONEB) 0.5-2.5 (3) MG/3ML SOLN Take 3 mLs by nebulization every 4 (four) hours. 08/06/15   Dustin Flock, MD  lisinopril (PRINIVIL,ZESTRIL) 10 MG tablet Take 10 mg by mouth daily.    [provider]  montelukast (SINGULAIR) 10 MG tablet Take 10 mg by mouth at bedtime.    [provider]  OXYGEN Inhale 2 L into the lungs at bedtime. And as needed    [provider]  pantoprazole (PROTONIX) 20 MG tablet Take 20 mg by mouth daily.    [provider]  tiotropium (SPIRIVA) 18 MCG inhalation capsule Place 18 mcg into inhaler and inhale daily.    [provider]   DG Chest 2 View  Result Date: 05/14/2020 CLINICAL DATA:  Cough and shortness of breath EXAM: CHEST - 2 VIEW COMPARISON:  6/10/8 FINDINGS: Cardiac shadow is within normal limits. Aortic calcifications are again identified. Diffuse interstitial scarring and fibrosis is noted stable from the prior exam. Lungs are again hyperinflated. No acute bony abnormality is seen. IMPRESSION: COPD and chronic fibrotic changes.  No acute abnormality noted. Electronically Signed   By: Inez Catalina M.D.   On: 05/14/2020 16:43   DG Foot Complete Right  Result Date: 05/14/2020 CLINICAL DATA:  Concern for possible osteomyelitis. Bilateral foot swelling and redness. The top of the right foot is black. Ulcer to the bottom of the heel. EXAM: RIGHT FOOT COMPLETE - 3+ VIEW COMPARISON:  None. FINDINGS: There is a hallux valgus deformity. No soft tissue gas identified. No bony erosion to suggest osteomyelitis. Deformity of the fifth metatarsal base is likely sequela of previous trauma. IMPRESSION: 1. Deformity of the  fifth metatarsal base is likely due to previous trauma. No evidence of osteomyelitis on this study. MRI would be more sensitive for osteomyelitis. Electronically Signed   By: Dorise Bullion III M.D   On: 05/14/2020 20:04    Positive ROS: All other systems have been reviewed and were otherwise negative with the exception of those mentioned in the HPI and as above.  12 point ROS was performed.  Physical Exam: General: Alert and oriented.  No apparent distress.  Vascular:  Left foot:Dorsalis Pedis:  present Posterior Tibial:  diminished.  Diminished pulse secondary to edema   Right foot: Dorsalis Pedis:  present Posterior Tibial:  diminished.  Diminished pulses secondary to edema.  Capillary fill time is brisk  Neuro:intact sensation  Derm: Left foot without ulceration or wound.  Severely elongated nails.  Right foot with diffuse erythema.  Areas of superficial fibrotic tissue.  No acute abscess or purulence.  Fair amount of clear drainage noted.  See clinical picture    Ortho/MS: Diffuse edema to bilateral lower extremities.  Assessment: Cellulitis with superficial ulceration and fibrotic tissue right foot likely pressure induced from edema and shoe gear. History of nicotine abuse. COPD COVID positive  Plan: X-ray was negative.  I reviewed it myself.  At this point we will start with Verdene Lennert solutions to try to decrease some of the superficial fibrotic nonviable tissue.  This should remove some of the bioburden dorsally.  Can eventually switch to Betadine or other topical dressing once superficial contamination has diminished somewhat.  I am going to order an MRI just to make sure there is no abscess to be concerned about deep into the midfoot area.  We will follow while in house.     Elesa Hacker, DPM Cell [phone]   05/15/2020 5:05 PM

## 2020-05-15 NOTE — ED Notes (Signed)
Wound care MD bedside to dress wounds.

## 2020-05-15 NOTE — ED Notes (Signed)
Patient given meal tray.

## 2020-05-15 NOTE — Progress Notes (Signed)
OT Cancellation Note  Patient Details Name: Jeremiah [name] MRN: [id number] DOB: [birth year]   Cancelled Treatment:    Reason Eval/Treat Not Completed: Patient not medically ready. Order received and chart reviewed. Per MD, will hold therapy services this date pending further workup. Will continue to follow and initiate services at later date/time as able. Thank you.  Dessie Coma, M.S. OTR/L  05/15/20, 9:24 AM  ascom [phone]

## 2020-05-15 NOTE — Progress Notes (Addendum)
Pharmacy Antibiotic Note  Jeremiah [name] is a 70 y.o. male admitted on 05/14/2020 with cellulitis .  Pharmacy has been consulted for Vancomycin and Meropenem dosing.  Pt c/o foot swelling and pain, starting ~2 weeks ago.  Pt also COVID positive.  Med hx of COPD, HTN, GERD.  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  Search of Care Everywhere yield not evidence pt has received a carbapenem in past.   Plan: Pt ordered Meropenem 1gm once given at 2330.  Monitoring for signs of allergic reaction. If no rxn, will continue with meropenem 1gm q8hr based on pt SCr and indication.  Pt ordered LD of Vancomycin 2gm.   Will ordered Vancomycin 1gm IV Q 12 hrs per AUC Calc.  Goal AUC 400-550. Expected AUC: 489.8 SCr used: 1.0  Pharmacy will continue to monitor for sx of allergic reaction to meropenem, Pharmacy will monitor for lab cx results Pharmacy will continue to follow SCr and will adjust abx dosing if warranted.  Height: 6' (182.9 cm) Weight: 83.5 kg (184 lb) IBW/kg (Calculated) : 77.6  Temp (24hrs), Avg:97.7 F (36.5 C), Min:97.7 F (36.5 C), Max:97.7 F (36.5 C)  Recent Labs  Lab 05/14/20 1610 05/14/20 2243 05/14/20 2333  WBC 12.0* 13.1*  --   CREATININE 1.09 1.00  --   LATICACIDVEN  --  1.3 1.0    Estimated Creatinine Clearance: 76.5 mL/min (by C-G formula based on SCr of 1 mg/dL).    Allergies  Allergen Reactions  . Penicillins Anaphylaxis, Swelling and Other (See Comments)    Has patient had a PCN reaction causing immediate rash, facial/tongue/throat swelling, SOB or lightheadedness with hypotension: Yes Has patient had a PCN reaction causing severe rash involving mucus membranes or skin necrosis: No Has patient had a PCN reaction that required hospitalization No Has patient had a PCN reaction occurring within the last 10 years: No If all of the above answers are "NO", then may proceed with Cephalosporin use.    Antimicrobials this admission: 01/07 Vancomycin >>  01/07  Meropenem >>   Microbiology results: 01/07 BCx: Pending*  Thank you for allowing pharmacy to be a part of this patient's care.  Renda Rolls, PharmD, [facility] 05/15/2020 12:19 AM

## 2020-05-16 ENCOUNTER — Inpatient Hospital Stay: Payer: Medicare HMO

## 2020-05-16 DIAGNOSIS — L03115 Cellulitis of right lower limb: Secondary | ICD-10-CM | POA: Diagnosis not present

## 2020-05-16 LAB — CBC
HCT: 36.1 % — ABNORMAL LOW (ref 39.0–52.0)
Hemoglobin: 12.2 g/dL — ABNORMAL LOW (ref 13.0–17.0)
MCH: 29.5 pg (ref 26.0–34.0)
MCHC: 33.8 g/dL (ref 30.0–36.0)
MCV: 87.4 fL (ref 80.0–100.0)
Platelets: 272 10*3/uL (ref 150–400)
RBC: 4.13 MIL/uL — ABNORMAL LOW (ref 4.22–5.81)
RDW: 14.6 % (ref 11.5–15.5)
WBC: 9.3 10*3/uL (ref 4.0–10.5)
nRBC: 0 % (ref 0.0–0.2)

## 2020-05-16 LAB — D-DIMER, QUANTITATIVE: D-Dimer, Quant: 0.69 ug/mL-FEU — ABNORMAL HIGH (ref 0.00–0.50)

## 2020-05-16 LAB — COMPREHENSIVE METABOLIC PANEL
ALT: 45 U/L — ABNORMAL HIGH (ref 0–44)
AST: 73 U/L — ABNORMAL HIGH (ref 15–41)
Albumin: 2.8 g/dL — ABNORMAL LOW (ref 3.5–5.0)
Alkaline Phosphatase: 156 U/L — ABNORMAL HIGH (ref 38–126)
Anion gap: 6 (ref 5–15)
BUN: 23 mg/dL (ref 8–23)
CO2: 32 mmol/L (ref 22–32)
Calcium: 8.3 mg/dL — ABNORMAL LOW (ref 8.9–10.3)
Chloride: 92 mmol/L — ABNORMAL LOW (ref 98–111)
Creatinine, Ser: 0.55 mg/dL — ABNORMAL LOW (ref 0.61–1.24)
GFR, Estimated: >60 mL/min (ref >60)
Glucose, Bld: 170 mg/dL — ABNORMAL HIGH (ref 70–99)
Potassium: 3.6 mmol/L (ref 3.5–5.1)
Sodium: 130 mmol/L — ABNORMAL LOW (ref 135–145)
Total Bilirubin: 0.6 mg/dL (ref 0.3–1.2)
Total Protein: 6 g/dL — ABNORMAL LOW (ref 6.5–8.1)

## 2020-05-16 LAB — FERRITIN: Ferritin: 52 ng/mL (ref 24–336)

## 2020-05-16 LAB — HIV ANTIBODY (ROUTINE TESTING W REFLEX): HIV Screen 4th Generation wRfx: NONREACTIVE

## 2020-05-16 LAB — PROCALCITONIN: Procalcitonin: <0.1 ng/mL

## 2020-05-16 LAB — C-REACTIVE PROTEIN: CRP: 6 mg/dL — ABNORMAL HIGH (ref <1.0)

## 2020-05-16 NOTE — Progress Notes (Signed)
Pharmacy Antibiotic Note  Jeremiah [name] is a 70 y.o. male admitted on 05/14/2020 with cellulitis .  Pharmacy has been consulted for Vancomycin and Meropenem dosing.  Pt c/o foot swelling and pain, starting ~2 weeks ago.  Pt also COVID positive.  Med hx of COPD, HTN, GERD.  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 Started on meropenem, no Hx of ESBL - recommended a change to aztreonam. X-ray foot did not show osteomyelitis, MRI was recommended.   Plan: Continue aztreonam 2 g q8H   Pt is currently on vancomycin 1000 mg q12H. Will obtain peak and trough after 1/9 PM dose.   Height: 6' (182.9 cm) Weight: 83.5 kg (184 lb) IBW/kg (Calculated) : 77.6  Temp (24hrs), Avg:98.4 F (36.9 C), Min:98.4 F (36.9 C), Max:98.4 F (36.9 C)  Recent Labs  Lab 05/14/20 1610 05/14/20 2243 05/14/20 2333 05/15/20 0112 05/16/20 0455  WBC 12.0* 13.1*  --  11.6* 9.3  CREATININE 1.09 1.00  --   --  0.55*  LATICACIDVEN  --  1.3 1.0  --   --     Estimated Creatinine Clearance: 95.7 mL/min (A) (by C-G formula based on SCr of 0.55 mg/dL (L)).    Allergies  Allergen Reactions  . Penicillins Anaphylaxis, Swelling and Other (See Comments)    Has patient had a PCN reaction causing immediate rash, facial/tongue/throat swelling, SOB or lightheadedness with hypotension: Yes Has patient had a PCN reaction causing severe rash involving mucus membranes or skin necrosis: No Has patient had a PCN reaction that required hospitalization No Has patient had a PCN reaction occurring within the last 10 years: No If all of the above answers are "NO", then may proceed with Cephalosporin use.    Antimicrobials this admission: 01/07 Vancomycin >>  01/07 Meropenem >>   Microbiology results: 01/07 BCx: Pending*  Thank you for allowing pharmacy to be a part of this patient's care.  Eleonore Chiquito, PharmD, BCPS 05/16/2020 12:26 PM

## 2020-05-16 NOTE — ED Notes (Signed)
Patient transported to MRI 

## 2020-05-16 NOTE — Progress Notes (Signed)
PROGRESS NOTE    Jeremiah [name]  BJS:[id number] DOB: 10-Sep-1950 DOA: 05/14/2020 PCP: Herminio Commons, MD   Chief Complaint  Patient presents with  . Foot Swelling   Brief Narrative: 70 year old male with COPD chronic hypoxia on 2 L home oxygen, hypertension, nicotine dependence presented with 2-week history of swelling of bilateral lower extremities mostly in the right foot. In the ED hypotensive with soft blood pressure tachypneic 26 hypoxic in 80s on room air 93% on 4 L blood work showed hyponatremia 125, elevated LFTs leukocytosis troponin and BNP normal EKG sinus rhythm EKG with chronic changes x-ray of the foot no evidence of osteomyelitis. Patient was placed on broad-spectrum antibiotics and admitted, his COVID-19 came back positive during night-and was placed on airborne precaution along with steroids and remdesivir.  Subjective: No acute events overnight.  On 5 L nasal cannula. MRI  Rt foot done this am On 5l , no shortness of breath or chest pain Assessment & Plan:  Cellulitis of right foot with sepsis: Appreciate Dr Alvera Singh input-started on dakin dressing to debulk.x-ray negative for bone infection going for MRI foot today.  Procalcitonin reassuring.  Continue current broad-spectrum antibiotics with- vancomycin and aztreonam- pharmacy dosing.  X-ray foot did not show osteomyelitis, MRI was recommended, will obtain Podiatry consult, notified Dr Vickki Muff.Blood culture sent. Clinically stable.  Acute on chronic respiratory failure with hypoxia from COPD exacerbation and COVID-pneumonia., COVID-19 came back positive: Continue on IV remdesivir given that he is a high risk and also symptomatic.  Continue I prednisone, remdesivir. Procalcitonin is negative and reassuring.  Monitor inflammatory markers as below. CXR shows lungs are hyperinflated and has chronic fibrotic changes and COPD.  COVID-19 Labs Recent Labs    05/15/20 1147 05/15/20 1148 05/16/20 0455  DDIMER  --  0.47  0.69*  FERRITIN 56  --  52  CRP  --  8.6*  --    Nicotine dependence: Counseled on quitting on admission.  Hyponatremia: Sodium improving , off ivf encourage oral hydration.   Recent Labs  Lab 05/14/20 1610 05/14/20 2243 05/16/20 0455  NA 125* 127* 130*   Abnormal LFTs: Likely from sepsis COVID infection.  Monitor..  Improving.  Monitor Recent Labs  Lab 05/14/20 1610 05/14/20 2243 05/16/20 0455  AST 56* 50* 73*  ALT 45* 41 45*  ALKPHOS 188* 158* 156*  BILITOT 0.9 0.9 0.6  PROT 7.1 6.1* 6.0*  ALBUMIN 3.5 3.0* 2.8*  INR  --  1.0  --    Nutrition: Diet Order            Diet regular Room service appropriate? Yes; Fluid consistency: Thin  Diet effective now                 Body mass index is 24.95 kg/m.  DVT prophylaxis: enoxaparin (LOVENOX) injection 40 mg Start: 05/15/20 0800 Code Status:   Code Status: Full Code Family Communication: plan of care discussed with patient at bedside. I called his Friend [name] and updated.  Status is: Inpatient Remains inpatient appropriate because:IV treatments appropriate due to intensity of illness or inability to take PO and Inpatient level of care appropriate due to severity of illness  Dispo:The patient is from: Home            Anticipated d/c is to: Home            Anticipated d/c date is: > 3 days            Patient currently is not medically stable  to d/c.  Consultants: Podiatry Procedures:see note  Culture/Microbiology    Component Value Date/Time   SDES BLOOD LEFT ANTECUBITAL 05/14/2020 2008   SDES BLOOD RIGHT ANTECUBITAL 05/14/2020 2008   SPECREQUEST  05/14/2020 2008    BOTTLES DRAWN AEROBIC AND ANAEROBIC Blood Culture results may not be optimal due to an inadequate volume of blood received in culture bottles   SPECREQUEST  05/14/2020 2008    BOTTLES DRAWN AEROBIC AND ANAEROBIC Blood Culture results may not be optimal due to an inadequate volume of blood received in culture bottles   CULT  05/14/2020 2008    NO  GROWTH 2 DAYS Performed at [facility], [address], [place], [place] [address]    CULT  05/14/2020 2008    NO GROWTH 2 DAYS Performed at [facility], [address] [place], [place] [address]    REPTSTATUS PENDING 05/14/2020 2008   REPTSTATUS PENDING 05/14/2020 2008    Other culture-see note  Medications: Scheduled Meds: . atorvastatin  40 mg Oral Daily  . enoxaparin (LOVENOX) injection  40 mg Subcutaneous Q24H  . fluticasone furoate-vilanterol  1 puff Inhalation Daily  . ipratropium-albuterol  3 mL Nebulization Q6H  . montelukast  10 mg Oral QHS  . nicotine  21 mg Transdermal Daily  . pantoprazole  20 mg Oral Daily  . predniSONE  40 mg Oral Q breakfast  . sodium hypochlorite   Topical Daily  . tiotropium  18 mcg Inhalation Daily   Continuous Infusions: . aztreonam Stopped (05/16/20 0327)  . remdesivir 100 mg in NS 100 mL    . vancomycin Stopped (05/15/20 2327)    Antimicrobials: Anti-infectives (From admission, onward)   Start     Dose/Rate Route Frequency Ordered Stop   05/16/20 1000  remdesivir 100 mg in sodium chloride 0.9 % 100 mL IVPB       "Followed by" Linked Group Details   100 mg 200 mL/hr over 30 Minutes Intravenous Daily 05/15/20 1449 05/20/20 0959   05/15/20 1600  remdesivir 200 mg in sodium chloride 0.9% 250 mL IVPB       "Followed by" Linked Group Details   200 mg 580 mL/hr over 30 Minutes Intravenous  Once 05/15/20 1449 05/15/20 2208   05/15/20 1400  aztreonam (AZACTAM) injection 2 g  Status:  Discontinued        2 g Intramuscular Every 8 hours 05/15/20 1054 05/15/20 1055   05/15/20 1400  aztreonam (AZACTAM) 2 g in sodium chloride 0.9 % 100 mL IVPB        2 g 200 mL/hr over 30 Minutes Intravenous Every 8 hours 05/15/20 1055     05/15/20 0800  vancomycin (VANCOCIN) IVPB 1000 mg/200 mL premix        1,000 mg 200 mL/hr over 60 Minutes Intravenous Every 12 hours 05/15/20 0024     05/15/20 0730  meropenem (MERREM) 1 g in sodium  chloride 0.9 % 100 mL IVPB  Status:  Discontinued        1 g 200 mL/hr over 30 Minutes Intravenous Every 8 hours 05/15/20 0024 05/15/20 1054   05/14/20 2130  vancomycin (VANCOCIN) IVPB 1000 mg/200 mL premix  Status:  Discontinued        1,000 mg 200 mL/hr over 60 Minutes Intravenous  Once 05/14/20 2118 05/15/20 0020   05/14/20 2130  meropenem (MERREM) 1 g in sodium chloride 0.9 % 100 mL IVPB        1 g 200 mL/hr over 30 Minutes Intravenous  Once 05/14/20 2118 05/15/20 0000   05/14/20 1945  vancomycin (VANCOCIN) IVPB 1000 mg/200 mL premix  Status:  Discontinued        1,000 mg 200 mL/hr over 60 Minutes Intravenous  Once 05/14/20 1933 05/14/20 1938   05/14/20 1945  vancomycin (VANCOCIN) IVPB 1000 mg/200 mL premix  Status:  Discontinued        1,000 mg 200 mL/hr over 60 Minutes Intravenous  Once 05/14/20 1933 05/14/20 1938   05/14/20 1945  vancomycin (VANCOREADY) IVPB 2000 mg/400 mL        2,000 mg 200 mL/hr over 120 Minutes Intravenous  Once 05/14/20 1939 05/14/20 2210     Objective: Vitals: Today's Vitals   05/16/20 0700 05/16/20 0715 05/16/20 0730 05/16/20 0749  BP: 122/66  118/77   Pulse: 74 79 84   Resp: 17 (!) 22 (!) 24   Temp:      TempSrc:      SpO2: 95% 93% (!) 88%   Weight:      Height:      PainSc:    0-No pain    Intake/Output Summary (Last 24 hours) at 05/16/2020 0845 Last data filed at 05/16/2020 0342 Gross per 24 hour  Intake 360 ml  Output 1130 ml  Net -770 ml   Filed Weights   05/14/20 1551  Weight: 83.5 kg   Weight change:   Intake/Output from previous day: 01/08 0701 - 01/09 0700 In: 360 [P.O.:360] Out: 1130 [Urine:1130] Intake/Output this shift: No intake/output data recorded.  Examination: General exam: AAOx3, old for his age, NAD, weak appearing. HEENT:Oral mucosa moist, Ear/Nose WNL grossly, dentition normal. Respiratory system: bilaterally diminished BS,no use of accessory muscle Cardiovascular system: S1 & S2 +, No JVD,. Gastrointestinal  system: Abdomen soft, NT,ND, BS+ Nervous System:Alert, awake, moving extremities and grossly nonfocal Extremities: b/l LE edema, and  Erythema and swelling rt> Lt LE, see pic  Skin: No rashes,no icterus. MSK: Normal muscle bulk,tone, power     Data Reviewed: I have personally reviewed following labs and imaging studies CBC: Recent Labs  Lab 05/14/20 1610 05/14/20 2243 05/15/20 0112 05/16/20 0455  WBC 12.0* 13.1* 11.6* 9.3  NEUTROABS 10.3* 11.1*  --   --   HGB 13.9 12.5* 12.2* 12.2*  HCT 40.0 37.0* 35.5* 36.1*  MCV 86.2 87.3 87.0 87.4  PLT 302 280 274 865   Basic Metabolic Panel: Recent Labs  Lab 05/14/20 1610 05/14/20 2243 05/16/20 0455  NA 125* 127* 130*  K 3.4* 4.0 3.6  CL 87* 89* 92*  CO2 27 27 32  GLUCOSE 117* 96 170*  BUN 31* 31* 23  CREATININE 1.09 1.00 0.55*  CALCIUM 8.5* 8.1* 8.3*   GFR: Estimated Creatinine Clearance: 95.7 mL/min (A) (by C-G formula based on SCr of 0.55 mg/dL (L)). Liver Function Tests: Recent Labs  Lab 05/14/20 1610 05/14/20 2243 05/16/20 0455  AST 56* 50* 73*  ALT 45* 41 45*  ALKPHOS 188* 158* 156*  BILITOT 0.9 0.9 0.6  PROT 7.1 6.1* 6.0*  ALBUMIN 3.5 3.0* 2.8*   No results for input(s): LIPASE, AMYLASE in the last 168 hours. No results for input(s): AMMONIA in the last 168 hours. Coagulation Profile: Recent Labs  Lab 05/14/20 2243  INR 1.0   Cardiac Enzymes: No results for input(s): CKTOTAL, CKMB, CKMBINDEX, TROPONINI in the last 168 hours. BNP (last 3 results) No results for input(s): PROBNP in the last 8760 hours. HbA1C: No results for input(s): HGBA1C in the last 72 hours. CBG: No results  for input(s): GLUCAP in the last 168 hours. Lipid Profile: No results for input(s): CHOL, HDL, LDLCALC, TRIG, CHOLHDL, LDLDIRECT in the last 72 hours. Thyroid Function Tests: No results for input(s): TSH, T4TOTAL, FREET4, T3FREE, THYROIDAB in the last 72 hours. Anemia Panel: Recent Labs    05/15/20 1147 05/16/20 0455   FERRITIN 56 52   Sepsis Labs: Recent Labs  Lab 05/14/20 2243 05/14/20 2333 05/15/20 1147 05/16/20 0455  PROCALCITON  --   --  <0.10 <0.10  LATICACIDVEN 1.3 1.0  --   --     Recent Results (from the past 240 hour(s))  Blood culture (routine x 2)     Status: None (Preliminary result)   Collection Time: 05/14/20  8:08 PM   Specimen: BLOOD  Result Value Ref Range Status   Specimen Description BLOOD LEFT ANTECUBITAL  Final   Special Requests   Final    BOTTLES DRAWN AEROBIC AND ANAEROBIC Blood Culture results may not be optimal due to an inadequate volume of blood received in culture bottles   Culture   Final    NO GROWTH 2 DAYS Performed at [facility], [address] [place], [place] [address]    Report Status PENDING  Incomplete  Blood culture (routine x 2)     Status: None (Preliminary result)   Collection Time: 05/14/20  8:08 PM   Specimen: BLOOD  Result Value Ref Range Status   Specimen Description BLOOD RIGHT ANTECUBITAL  Final   Special Requests   Final    BOTTLES DRAWN AEROBIC AND ANAEROBIC Blood Culture results may not be optimal due to an inadequate volume of blood received in culture bottles   Culture   Final    NO GROWTH 2 DAYS Performed at [facility], [address] [place], [place] [address]    Report Status PENDING  Incomplete  Resp Panel by RT-PCR (Flu A&B, Covid)     Status: Abnormal   Collection Time: 05/14/20 10:43 PM  Result Value Ref Range Status   SARS Coronavirus 2 by RT PCR POSITIVE (A) NEGATIVE Final    Comment: RESULT CALLED TO, READ BACK BY AND VERIFIED WITH: KASSIE FELTS ON 05/14/20 AT 2344 QSD (NOTE) SARS-CoV-2 target nucleic acids are DETECTED.  The SARS-CoV-2 RNA is generally detectable in upper respiratory specimens during the acute phase of infection. Positive results are indicative of the presence of the identified virus, but do not rule out bacterial infection or co-infection with other pathogens not detected  by the test. Clinical correlation with patient history and other diagnostic information is necessary to determine patient infection status. The expected result is Negative.  Fact Sheet for Patients: EntrepreneurPulse.com.au  Fact Sheet for Healthcare Providers: IncredibleEmployment.be  This test is not yet approved or cleared by the [place] FDA and  has been authorized for detection and/or diagnosis of SARS-CoV-2 by FDA under an Emergency Use Authorization (EUA).  This EUA will remain in effect (meaning this test can b e used) for the duration of  the COVID-19 declaration under Section 564(b)(1) of the Act, 21 U.S.C. section 360bbb-3(b)(1), unless the authorization is terminated or revoked sooner.     Influenza A by PCR NEGATIVE NEGATIVE Final   Influenza B by PCR NEGATIVE NEGATIVE Final    Comment: (NOTE) The Xpert Xpress SARS-CoV-2/FLU/RSV plus assay is intended as an aid in the diagnosis of influenza from Nasopharyngeal swab specimens and should not be used as a sole basis for treatment. Nasal washings and aspirates are unacceptable for  Xpert Xpress SARS-CoV-2/FLU/RSV testing.  Fact Sheet for Patients: EntrepreneurPulse.com.au  Fact Sheet for Healthcare Providers: IncredibleEmployment.be  This test is not yet approved or cleared by the [place] FDA and has been authorized for detection and/or diagnosis of SARS-CoV-2 by FDA under an Emergency Use Authorization (EUA). This EUA will remain in effect (meaning this test can be used) for the duration of the COVID-19 declaration under Section 564(b)(1) of the Act, 21 U.S.C. section 360bbb-3(b)(1), unless the authorization is terminated or revoked.  Performed at [facility], [address] [place], [place] [address]      Radiology Studies: DG Chest 2 View  Result Date: 05/14/2020 CLINICAL DATA:  Cough and shortness of breath EXAM:  CHEST - 2 VIEW COMPARISON:  6/10/8 FINDINGS: Cardiac shadow is within normal limits. Aortic calcifications are again identified. Diffuse interstitial scarring and fibrosis is noted stable from the prior exam. Lungs are again hyperinflated. No acute bony abnormality is seen. IMPRESSION: COPD and chronic fibrotic changes.  No acute abnormality noted. Electronically Signed   By: Inez Catalina M.D.   On: 05/14/2020 16:43   DG Foot Complete Right  Result Date: 05/14/2020 CLINICAL DATA:  Concern for possible osteomyelitis. Bilateral foot swelling and redness. The top of the right foot is black. Ulcer to the bottom of the heel. EXAM: RIGHT FOOT COMPLETE - 3+ VIEW COMPARISON:  None. FINDINGS: There is a hallux valgus deformity. No soft tissue gas identified. No bony erosion to suggest osteomyelitis. Deformity of the fifth metatarsal base is likely sequela of previous trauma. IMPRESSION: 1. Deformity of the fifth metatarsal base is likely due to previous trauma. No evidence of osteomyelitis on this study. MRI would be more sensitive for osteomyelitis. Electronically Signed   By: Dorise Bullion III M.D   On: 05/14/2020 20:04     LOS: 2 days   Antonieta Pert, MD [facility]  05/16/2020, 8:45 AM

## 2020-05-16 NOTE — Progress Notes (Signed)
Daily Progress Note   Subjective  - * No surgery found *  F/u right foot.  MRI performed today.  Objective Vitals:   05/16/20 1030 05/16/20 1045 05/16/20 1100 05/16/20 1130  BP: 115/72  120/67 132/70  Pulse: 81 80 79 79  Resp: 18 18 18 19   Temp:      TempSrc:      SpO2: 95% 93% 94% 91%  Weight:      Height:        Physical Exam: MRI negative for abscess.  No deep infection  Laboratory CBC    Component Value Date/Time   WBC 9.3 05/16/2020 0455   HGB 12.2 (L) 05/16/2020 0455   HGB 13.6 10/01/2013 0134   HCT 36.1 (L) 05/16/2020 0455   HCT 40.7 10/01/2013 0134   PLT 272 05/16/2020 0455   PLT 308 10/01/2013 0134    BMET    Component Value Date/Time   NA 130 (L) 05/16/2020 0455   NA 136 10/02/2013 0435   K 3.6 05/16/2020 0455   K 4.4 10/02/2013 0435   CL 92 (L) 05/16/2020 0455   CL 104 10/02/2013 0435   CO2 32 05/16/2020 0455   CO2 26 10/02/2013 0435   GLUCOSE 170 (H) 05/16/2020 0455   GLUCOSE 152 (H) 10/02/2013 0435   BUN 23 05/16/2020 0455   BUN 19 (H) 10/02/2013 0435   CREATININE 0.55 (L) 05/16/2020 0455   CREATININE 0.83 10/02/2013 0435   CALCIUM 8.3 (L) 05/16/2020 0455   CALCIUM 9.1 10/02/2013 0435   GFRNONAA >60 05/16/2020 0455   GFRNONAA >60 10/02/2013 0435   GFRAA >60 10/20/2016 0457   GFRAA >60 10/02/2013 0435    Assessment/Planning: Superficial ulceration right foot with cellulitis   No need for surgical debridement  C/W local wound care while in house. Dakins ordered for now.  Will follow while in house.  Can f/u with wound care upon d/c or f/u with podiatry if not able to see wound care.    Jeremiah [name] A  05/16/2020, 12:28 PM

## 2020-05-16 NOTE — ED Notes (Signed)
Dressing changed to right foot per order. Pt tolerated well.

## 2020-05-16 NOTE — Evaluation (Signed)
Occupational Therapy Evaluation Patient Details Name: Jeremiah [name] MRN: [id number] DOB: Aug 04, 1950 Today's Date: 05/16/2020    History of Present Illness Jeremiah [name] is a 70 y.o. male  who presents to the emergency room with a 2-week history of swelling bilateral lower extremities mostly over the right foot. States he has not taken off his shoes in over a week because he is afraid that he is unable to put it back on. PMH: significant for COPD on home oxygen at 2 L with, HTN, nicotine dependence.   Clinical Impression   Jeremiah [name] presents to OT with generalized weakness, decreased lower body access, and likely weightbearing precautions that impact his ability to safely and independently engage in functional tasks.  Per MD request, session limited to bedlevel as MRI and podiatry consults are still pending.   Prior to admission, pt was able to complete functional mobility independently, but reports it has been difficult lately 2/2 difficulty accessing a walker and BLE swelling.  He lives with friends and relies on them for transportation, has recently been relying on them for ADL/IADL assistance.  Currently, pt requires setup/intermittent min assist for upper body ADLs while seated.  Unable to assess completion of lower body ADLs (dressing, bathing, functional mobility), but anticipate pt requires grossly moderate assist given he is unable to reach his feet and has been on bedrest for multiple days.  Additional weightbearing precautions may have an impact on his functional mobility as well.  Jeremiah [name] will continue to benefit from skilled OT services in acute setting to address functional strengthening and safety and independence in ADLs.  SNF is most appropriate discharge recommendation at this time.    Follow Up Recommendations  SNF    Equipment Recommendations  3 in 1 bedside commode;Tub/shower seat    Recommendations for Other Services       Precautions / Restrictions  Precautions Precautions: Fall Precaution Comments: weeping BLE, MRI, podiatry pending Restrictions Weight Bearing Restrictions:  (awaiting weightbearing restrictions once podiatry makes recommendations following MRI) Other Position/Activity Restrictions: session limited to bedlevel per MD request      Mobility Bed Mobility               General bed mobility comments: deferred as MRI/podiatry consults still pending, MD asked that gross mobility be deferred this date.    Transfers                      Balance Overall balance assessment:  (not assessed; minimal baseline balance deficits per patient)                                         ADL either performed or assessed with clinical judgement   ADL Overall ADL's : Needs assistance/impaired Eating/Feeding: Set up;Sitting   Grooming: Set up;Sitting   Upper Body Bathing: Set up;Sitting   Lower Body Bathing: Moderate assistance;Sitting/lateral leans   Upper Body Dressing : Set up;Sitting     Lower Body Dressing Details (indicate cue type and reason): not tested but anticipate roughly mod assist 2/2 pt reports he cannot reach feet   Toilet Transfer Details (indicate cue type and reason): not tested 2/2 session limited to bedlevel           General ADL Comments: Pt grossly requires setup/intermittent min assist for seated UB ADLs 2/2 baseline rotator cuff injury.  Did not assess LB  ADLs 2/2 activity precautions but anticipate moderate assist for lower body dressing and bathing as pt cannot reach feet.     Vision Patient Visual Report: No change from baseline Additional Comments: pt reports cataract surgery ~11 months ago which impacted his vision, reports it has improved now.  No recent vision changes.     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 LUE Deficits / Details: chronic rotary cuff dysfunction; 5/5 elbow grossly, grip strong, no active shoulder flexion, habitual RUE assisted AA/ROM of LUE   Lower Extremity Assessment Lower Extremity Assessment: Generalized weakness RLE Deficits / Details: ankle ROM WNL, SLR 4+/5, hip/knee extension 4+/5, heel slide 3-/5 (very limited range) LLE Deficits / Details: ankle ROM WNL, SLR 4+/5, hip/knee extension 4+/5, heel slide 3-/5 (very limited range)       Communication Communication Communication: Other (comment) (SOB, desatting during conversation)   Cognition Arousal/Alertness: Awake/alert Behavior During Therapy: WFL for tasks assessed/performed Overall Cognitive Status: Within Functional Limits for tasks assessed                                 General Comments: alert and oriented x4   General Comments  SpO2 ~90% on 4-5L O2, decreasing to 89% with conversation    Exercises Other Exercises Other Exercises: provided education re: OT role and plan of care, fall and safety precautions, activity precautions, self care, pursed lip breathing, use of pulse oximeter to monitor O2 level at home   Shoulder Instructions      Home Living Family/patient expects to be discharged to:: Private residence Living Arrangements: Non-relatives/Friends (lives with friend, her boyfriend, and her granddaughter) Available Help at Discharge: Friend(s) Type of Home: Mobile home Home Access: Stairs to enter Entrance Stairs-Number of Steps: 10 at front, bilat rails; 2 at back door, but unable to access back door. Entrance Stairs-Rails: Right;Left Home Layout: One level     Bathroom Shower/Tub: Occupational psychologist: Standard         Additional Comments: Pt reports no grab bars in shower or by toilet, unable to  access a walker.      Prior Functioning/Environment Level of Independence: Needs assistance  Gait / Transfers Assistance Needed: Pt reports typically ambulating to toilet without assistive device, but endorses difficulty. ADL's / Homemaking Assistance Needed: Pt able to complete toileting, grooming, and feeding independently.  He requires assistance for lower body dressing as he cannot reach his feet.  Pt reports occasionally assisting with cooking/cleaning, manages his own medications.   Comments: Pt on 2L O2 at home, reports 1 fall in the past year (when recovering from cataract surgery)        OT Problem List: Decreased strength;Decreased range of motion;Decreased activity tolerance;Decreased knowledge of use of DME or AE      OT Treatment/Interventions: Self-care/ADL training;Therapeutic exercise;Energy conservation;DME and/or AE instruction;Therapeutic activities;Patient/family education    OT Goals(Current goals can be found in the care plan section) Acute Rehab OT Goals Patient Stated Goal: regain strength,maintain indpendence with basic mobility. OT Goal Formulation: With patient Time For Goal Achievement: 05/30/20 Potential to Achieve Goals: Good  OT Frequency: Min 1X/week   Barriers to D/C:  Inaccessible home environment          Co-evaluation              AM-PAC OT "6 Clicks" Daily Activity     Outcome Measure Help from another person eating meals?: None Help from another person taking care of personal grooming?: A Little Help from another person toileting, which includes using toliet, bedpan, or urinal?: A Lot Help from another person bathing (including washing, rinsing, drying)?: A Lot Help from another person to put on and taking off regular upper body clothing?: A Little Help from another person to put on and taking off regular lower body clothing?: A Lot 6 Click Score: 16   End of Session Equipment Utilized During Treatment: Oxygen  Activity Tolerance:  Patient tolerated treatment well Patient left: in bed;with call bell/phone within reach  OT Visit Diagnosis: Other abnormalities of gait and mobility (R26.89);Muscle weakness (generalized) (M62.81)                Time: 1610-9604 OT Time Calculation (min): 20 min Charges:  OT General Charges $OT Visit: 1 Visit OT Evaluation $OT Eval Moderate Complexity: 1 Mod OT Treatments $Self Care/Home Management : 8-22 mins  Myrtie Hawk Yosgar Demirjian, OTR/L 05/16/20, 12:49 PM

## 2020-05-16 NOTE — ED Notes (Signed)
Message sent to pharmacy for missing inhaler

## 2020-05-16 NOTE — ED Notes (Signed)
Patient placed on bedpan.

## 2020-05-16 NOTE — ED Notes (Signed)
Pharmacy sent message for missing med

## 2020-05-16 NOTE — ED Notes (Signed)
Patient removed from bedpan. Large BM cleaned from bedpan and patient. Clean bed pad placed under patient.

## 2020-05-17 DIAGNOSIS — L03115 Cellulitis of right lower limb: Secondary | ICD-10-CM | POA: Diagnosis not present

## 2020-05-17 LAB — D-DIMER, QUANTITATIVE: D-Dimer, Quant: 0.99 ug/mL-FEU — ABNORMAL HIGH (ref 0.00–0.50)

## 2020-05-17 LAB — CBC
HCT: 36.8 % — ABNORMAL LOW (ref 39.0–52.0)
Hemoglobin: 12.3 g/dL — ABNORMAL LOW (ref 13.0–17.0)
MCH: 29.4 pg (ref 26.0–34.0)
MCHC: 33.4 g/dL (ref 30.0–36.0)
MCV: 87.8 fL (ref 80.0–100.0)
Platelets: 255 10*3/uL (ref 150–400)
RBC: 4.19 MIL/uL — ABNORMAL LOW (ref 4.22–5.81)
RDW: 14.6 % (ref 11.5–15.5)
WBC: 14.8 10*3/uL — ABNORMAL HIGH (ref 4.0–10.5)
nRBC: 0 % (ref 0.0–0.2)

## 2020-05-17 LAB — COMPREHENSIVE METABOLIC PANEL
ALT: 49 U/L — ABNORMAL HIGH (ref 0–44)
AST: 81 U/L — ABNORMAL HIGH (ref 15–41)
Albumin: 2.6 g/dL — ABNORMAL LOW (ref 3.5–5.0)
Alkaline Phosphatase: 143 U/L — ABNORMAL HIGH (ref 38–126)
Anion gap: 9 (ref 5–15)
BUN: 24 mg/dL — ABNORMAL HIGH (ref 8–23)
CO2: 29 mmol/L (ref 22–32)
Calcium: 8.1 mg/dL — ABNORMAL LOW (ref 8.9–10.3)
Chloride: 93 mmol/L — ABNORMAL LOW (ref 98–111)
Creatinine, Ser: 0.48 mg/dL — ABNORMAL LOW (ref 0.61–1.24)
GFR, Estimated: >60 mL/min (ref >60)
Glucose, Bld: 139 mg/dL — ABNORMAL HIGH (ref 70–99)
Potassium: 3.6 mmol/L (ref 3.5–5.1)
Sodium: 131 mmol/L — ABNORMAL LOW (ref 135–145)
Total Bilirubin: 0.7 mg/dL (ref 0.3–1.2)
Total Protein: 5.7 g/dL — ABNORMAL LOW (ref 6.5–8.1)

## 2020-05-17 LAB — VANCOMYCIN, PEAK: Vancomycin Pk: 27 ug/mL — ABNORMAL LOW (ref 30–40)

## 2020-05-17 LAB — FERRITIN: Ferritin: 45 ng/mL (ref 24–336)

## 2020-05-17 LAB — PROCALCITONIN: Procalcitonin: <0.1 ng/mL

## 2020-05-17 LAB — C-REACTIVE PROTEIN: CRP: 3.3 mg/dL — ABNORMAL HIGH (ref <1.0)

## 2020-05-17 LAB — VANCOMYCIN, TROUGH: Vancomycin Tr: 13 ug/mL — ABNORMAL LOW (ref 15–20)

## 2020-05-17 LAB — GLUCOSE, CAPILLARY: Glucose-Capillary: 120 mg/dL — ABNORMAL HIGH (ref 70–99)

## 2020-05-17 MED ORDER — INFLUENZA VAC A&B SA ADJ QUAD 0.5 ML IM PRSY
0.5000 mL | PREFILLED_SYRINGE | INTRAMUSCULAR | Status: AC
  Administered 2020-05-25: 0.5 mL via INTRAMUSCULAR
  Filled 2020-05-17: qty 0.5

## 2020-05-17 MED ORDER — FUROSEMIDE 10 MG/ML IJ SOLN
20.0000 mg | Freq: Once | INTRAMUSCULAR | Status: AC
  Administered 2020-05-17: 20 mg via INTRAVENOUS
  Filled 2020-05-17: qty 4

## 2020-05-17 MED ORDER — IPRATROPIUM-ALBUTEROL 20-100 MCG/ACT IN AERS
1.0000 | INHALATION_SPRAY | Freq: Four times a day (QID) | RESPIRATORY_TRACT | Status: DC
  Administered 2020-05-18 – 2020-05-25 (×29): 1 via RESPIRATORY_TRACT
  Filled 2020-05-17: qty 4

## 2020-05-17 NOTE — ED Notes (Signed)
Pt resting at this time and states "he had a blow out". Pt had a BM this RN and Orthoptist cleaned pt up with bed change, stool was soft.  External male catehter placed. Pt denies any other needs at this time. PT currently on 5L/min. Pt is dyspneic when speaking, HOB elevated to help with lung expansion and breathing difficulty.

## 2020-05-17 NOTE — Progress Notes (Signed)
Pharmacy Antibiotic Note  Jeremiah [name] is a 70 y.o. male admitted on 05/14/2020 with cellulitis .  Pharmacy has been consulted for vancomycin and aztreonam dosing.  Pt c/o foot swelling and pain, starting ~2 weeks ago. Pt also COVID positive.  Med hx of COPD, HTN, GERD.  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 Started on meropenem, no Hx of ESBL - recommended a change to aztreonam. X-ray foot did not show osteomyelitis, MRI was recommended.   Plan: Continue aztreonam 2 g q8H   Continue vancomycin 1000 mg q12h --Vancomycin peak: 27 (1/10 at 0128) --Vancomycin trough: 13 (1/10 at 0950) --PK (calc.): Cmax 33.1, Cmin 12.6, AUC 513  Daily Scr per protocol. Scr improved from admission (1.09 >> 0.48)  Height: 6' (182.9 cm) Weight: 83.5 kg (184 lb) IBW/kg (Calculated) : 77.6  No data recorded.  Recent Labs  Lab 05/14/20 1610 05/14/20 2243 05/14/20 2333 05/15/20 0112 05/16/20 0455 05/17/20 0128 05/17/20 0950  WBC 12.0* 13.1*  --  11.6* 9.3 14.8*  --   CREATININE 1.09 1.00  --   --  0.55* 0.48*  --   LATICACIDVEN  --  1.3 1.0  --   --   --   --   VANCOTROUGH  --   --   --   --   --   --  13*  VANCOPEAK  --   --   --   --   --  27*  --     Estimated Creatinine Clearance: 95.7 mL/min (A) (by C-G formula based on SCr of 0.48 mg/dL (L)).    Allergies  Allergen Reactions  . Penicillins Anaphylaxis, Swelling and Other (See Comments)    Has patient had a PCN reaction causing immediate rash, facial/tongue/throat swelling, SOB or lightheadedness with hypotension: Yes Has patient had a PCN reaction causing severe rash involving mucus membranes or skin necrosis: No Has patient had a PCN reaction that required hospitalization No Has patient had a PCN reaction occurring within the last 10 years: No If all of the above answers are "NO", then may proceed with Cephalosporin use.    Antimicrobials this admission: Meropenem 1/7 >> 1/8 Vancomycin 1/7 >> Aztreonam 1/8 >> Remdesivir 1/8  >>  Microbiology results: 1/7 BCx: NGTD 1/7 SARS-CoV-2 PCR: (+)  Thank you for allowing pharmacy to be a part of this patient's care.  Benita Gutter 05/17/2020 1:08 PM

## 2020-05-17 NOTE — ED Notes (Signed)
Dakins applied to R foot. Dressing taken off with sterile water due to gauze sticking to wound. Non adhesive (tefla) bandage applied and wrapped with kurlex to secure bandage.

## 2020-05-17 NOTE — ED Notes (Signed)
Pt placed on bed pan and told to call when done to be taken off.

## 2020-05-17 NOTE — ED Notes (Signed)
Patient given coffee and sandwich tray per request.

## 2020-05-17 NOTE — ED Notes (Signed)
Received RN message for handoff via chat message/ chart reviewed

## 2020-05-17 NOTE — ED Notes (Signed)
Transport here to transport pt to [facility]. Inhalers, Breo, Spiriva, albuterol tubed to 1 C

## 2020-05-17 NOTE — ED Notes (Signed)
Pt had a BM, stool is loose, pt cleaned up, new brief placed and new male external cathter placed.

## 2020-05-17 NOTE — ED Notes (Signed)
Patient removed from bedpan and cleaned of incontinent stool by this RN and Ena Dawley, RN. Clean gown placed due to patient having incontinent episode of urine. Patient given fresh gown and warm blankets.

## 2020-05-17 NOTE — Progress Notes (Signed)
PROGRESS NOTE    Jeremiah [name]  GEX:[id number] DOB: Dec 24, 1950 DOA: 05/14/2020 PCP: Herminio Commons, MD   Chief Complaint  Patient presents with  . Foot Swelling   Brief Narrative: 70 year old male with COPD chronic hypoxia on 2 L home oxygen, hypertension, nicotine dependence presented with 2-week history of swelling of bilateral lower extremities mostly in the right foot. In the ED hypotensive with soft blood pressure tachypneic 26 hypoxic in 80s on room air 93% on 4 L blood work showed hyponatremia 125, elevated LFTs leukocytosis troponin and BNP normal EKG sinus rhythm EKG with chronic changes x-ray of the foot no evidence of osteomyelitis. Patient was placed on broad-spectrum antibiotics and admitted, his COVID-19 came back positive during night-and was placed on airborne precaution along with steroids and remdesivir. Seen by podiatry underwent MRI of the foot no involvement of bone no abscess advised to continue with local dressing antibiotics and outpatient follow-up  Subjective:  Patient on 5 L nasal cannula, at home on 2 L.  Has some chest pain and dyspnea with activity. No other new complaints. Reports he had a "mess" last night with bowel movement but no diarrhea  Assessment & Plan:  Cellulitis of right foot with sepsis/with chronic leg edema: Seen by Dr Mamie Laurel no bony involvement, underwent MRI of the right foot no abscess or osteomyelitis.  Per podiatry no need for surgical debridement, continue with local wound care while in-house, Dakin's ordered, podiatry following and can follow-up with wound care upon discharge or follow-up with podiatry if not able to see wound care.Procalcitonin reassuring.   Cont vancomycin and aztreonam.  Given his significant leg edema I will give Lasix IV x1  Acute on chronic respiratory failure with hypoxia from COPD exacerbation and COVID-pneumonia., COVID-19 came back positive: He is high risk given his chronic lung history, he sees.   Continue on IV remdesivir, oral prednisone, supplemental oxygen.  Procalcitonin negative.  Inflammatory markers being monitored as below and CRP improving nicely.  Continue to wean down oxygen to home setting of 2 L.  Currently on 5. CXR shows lungs are hyperinflated and has chronic fibrotic changes and COPD.  COVID-19 Labs Recent Labs    05/15/20 1147 05/15/20 1148 05/16/20 0455 05/17/20 0128  DDIMER  --  0.47 0.69* 0.99*  FERRITIN 56  --  52 45  CRP  --  8.6* 6.0* 3.3*   Nicotine dependence: Cessation advised.  Hyponatremia: Sodium improving.  Encourage oral hydration.  Recent Labs  Lab 05/14/20 1610 05/14/20 2243 05/16/20 0455 05/17/20 0128  NA 125* 127* 130* 131*   Abnormal LFTs: Likely from sepsis/COVID infection.  Monitor. Recent Labs  Lab 05/14/20 1610 05/14/20 2243 05/16/20 0455 05/17/20 0128  AST 56* 50* 73* 81*  ALT 45* 41 45* 49*  ALKPHOS 188* 158* 156* 143*  BILITOT 0.9 0.9 0.6 0.7  PROT 7.1 6.1* 6.0* 5.7*  ALBUMIN 3.5 3.0* 2.8* 2.6*  INR  --  1.0  --   --    Nutrition: Diet Order            Diet regular Room service appropriate? Yes; Fluid consistency: Thin  Diet effective now                 Body mass index is 24.95 kg/m.  DVT prophylaxis: enoxaparin (LOVENOX) injection 40 mg Start: 05/15/20 0800 Code Status:   Code Status: Full Code Family Communication: plan of care discussed with patient at bedside. I called his Friend [name] and updated.  Status  is: Inpatient Remains inpatient appropriate because:IV treatments appropriate due to intensity of illness or inability to take PO and Inpatient level of care appropriate due to severity of illness  Dispo:The patient is from: Home            Anticipated d/c is to: Home, get PTOT eval.            Anticipated d/c date is: 2 days            Patient currently is not medically stable to d/c.  Consultants: Podiatry Procedures:see note  Culture/Microbiology    Component Value Date/Time   SDES  BLOOD LEFT ANTECUBITAL 05/14/2020 2008   SDES BLOOD RIGHT ANTECUBITAL 05/14/2020 2008   SPECREQUEST  05/14/2020 2008    BOTTLES DRAWN AEROBIC AND ANAEROBIC Blood Culture results may not be optimal due to an inadequate volume of blood received in culture bottles   SPECREQUEST  05/14/2020 2008    BOTTLES DRAWN AEROBIC AND ANAEROBIC Blood Culture results may not be optimal due to an inadequate volume of blood received in culture bottles   CULT  05/14/2020 2008    NO GROWTH 2 DAYS Performed at [facility], [address], [place], [place] [address]    CULT  05/14/2020 2008    NO GROWTH 2 DAYS Performed at [facility], [address] [place], [place] [address]    REPTSTATUS PENDING 05/14/2020 2008   REPTSTATUS PENDING 05/14/2020 2008    Other culture-see note  Medications: Scheduled Meds: . atorvastatin  40 mg Oral Daily  . enoxaparin (LOVENOX) injection  40 mg Subcutaneous Q24H  . fluticasone furoate-vilanterol  1 puff Inhalation Daily  . ipratropium-albuterol  3 mL Nebulization Q6H  . montelukast  10 mg Oral QHS  . nicotine  21 mg Transdermal Daily  . pantoprazole  20 mg Oral Daily  . predniSONE  40 mg Oral Q breakfast  . sodium hypochlorite   Topical Daily  . tiotropium  18 mcg Inhalation Daily   Continuous Infusions: . aztreonam Stopped (05/17/20 0751)  . remdesivir 100 mg in NS 100 mL Stopped (05/16/20 1250)  . vancomycin Stopped (05/16/20 2309)    Antimicrobials: Anti-infectives (From admission, onward)   Start     Dose/Rate Route Frequency Ordered Stop   05/16/20 1000  remdesivir 100 mg in sodium chloride 0.9 % 100 mL IVPB       "Followed by" Linked Group Details   100 mg 200 mL/hr over 30 Minutes Intravenous Daily 05/15/20 1449 05/20/20 0959   05/15/20 1600  remdesivir 200 mg in sodium chloride 0.9% 250 mL IVPB       "Followed by" Linked Group Details   200 mg 580 mL/hr over 30 Minutes Intravenous  Once 05/15/20 1449 05/15/20 2208   05/15/20  1400  aztreonam (AZACTAM) injection 2 g  Status:  Discontinued        2 g Intramuscular Every 8 hours 05/15/20 1054 05/15/20 1055   05/15/20 1400  aztreonam (AZACTAM) 2 g in sodium chloride 0.9 % 100 mL IVPB        2 g 200 mL/hr over 30 Minutes Intravenous Every 8 hours 05/15/20 1055     05/15/20 0800  vancomycin (VANCOCIN) IVPB 1000 mg/200 mL premix        1,000 mg 200 mL/hr over 60 Minutes Intravenous Every 12 hours 05/15/20 0024     05/15/20 0730  meropenem (MERREM) 1 g in sodium chloride 0.9 % 100 mL IVPB  Status:  Discontinued  1 g 200 mL/hr over 30 Minutes Intravenous Every 8 hours 05/15/20 0024 05/15/20 1054   05/14/20 2130  vancomycin (VANCOCIN) IVPB 1000 mg/200 mL premix  Status:  Discontinued        1,000 mg 200 mL/hr over 60 Minutes Intravenous  Once 05/14/20 2118 05/15/20 0020   05/14/20 2130  meropenem (MERREM) 1 g in sodium chloride 0.9 % 100 mL IVPB        1 g 200 mL/hr over 30 Minutes Intravenous  Once 05/14/20 2118 05/15/20 0000   05/14/20 1945  vancomycin (VANCOCIN) IVPB 1000 mg/200 mL premix  Status:  Discontinued        1,000 mg 200 mL/hr over 60 Minutes Intravenous  Once 05/14/20 1933 05/14/20 1938   05/14/20 1945  vancomycin (VANCOCIN) IVPB 1000 mg/200 mL premix  Status:  Discontinued        1,000 mg 200 mL/hr over 60 Minutes Intravenous  Once 05/14/20 1933 05/14/20 1938   05/14/20 1945  vancomycin (VANCOREADY) IVPB 2000 mg/400 mL        2,000 mg 200 mL/hr over 120 Minutes Intravenous  Once 05/14/20 1939 05/14/20 2210     Objective: Vitals: Today's Vitals   05/17/20 0500 05/17/20 0515 05/17/20 0600 05/17/20 0700  BP: 119/69  120/66 121/74  Pulse: 87 86 87 83  Resp: (!) 23 20 19 17   Temp:      TempSrc:      SpO2:  93% 93% 92%  Weight:      Height:      PainSc:        Intake/Output Summary (Last 24 hours) at 05/17/2020 0753 Last data filed at 05/17/2020 0017 Gross per 24 hour  Intake 880 ml  Output -  Net 880 ml   Filed Weights   05/14/20  1551  Weight: 83.5 kg   Weight change:   Intake/Output from previous day: 01/09 0701 - 01/10 0700 In: 880 [P.O.:480; IV Piggyback:400] Out: -  Intake/Output this shift: No intake/output data recorded.  Examination: General exam: AAOx3, old for his age, NAD, weak appearing. HEENT:Oral mucosa moist, Ear/Nose WNL grossly, dentition normal. Respiratory system: bilaterally diminished breath sound, no use of accessory muscle Cardiovascular system: S1 & S2 +, No JVD,. Gastrointestinal system: Abdomen soft, NT,ND, BS+ Nervous System:Alert, awake, moving extremities and grossly nonfocal Extremities: Bilateral leg edema+, chronic appearing wound with dressing intact Skin: No rashes,no icterus. MSK: Normal muscle bulk,tone, power     Data Reviewed: I have personally reviewed following labs and imaging studies CBC: Recent Labs  Lab 05/14/20 1610 05/14/20 2243 05/15/20 0112 05/16/20 0455 05/17/20 0128  WBC 12.0* 13.1* 11.6* 9.3 14.8*  NEUTROABS 10.3* 11.1*  --   --   --   HGB 13.9 12.5* 12.2* 12.2* 12.3*  HCT 40.0 37.0* 35.5* 36.1* 36.8*  MCV 86.2 87.3 87.0 87.4 87.8  PLT 302 280 274 272 161   Basic Metabolic Panel: Recent Labs  Lab 05/14/20 1610 05/14/20 2243 05/16/20 0455 05/17/20 0128  NA 125* 127* 130* 131*  K 3.4* 4.0 3.6 3.6  CL 87* 89* 92* 93*  CO2 27 27 32 29  GLUCOSE 117* 96 170* 139*  BUN 31* 31* 23 24*  CREATININE 1.09 1.00 0.55* 0.48*  CALCIUM 8.5* 8.1* 8.3* 8.1*   GFR: Estimated Creatinine Clearance: 95.7 mL/min (A) (by C-G formula based on SCr of 0.48 mg/dL (L)). Liver Function Tests: Recent Labs  Lab 05/14/20 1610 05/14/20 2243 05/16/20 0455 05/17/20 0128  AST 56* 50* 73* 81*  ALT 45* 41 45* 49*  ALKPHOS 188* 158* 156* 143*  BILITOT 0.9 0.9 0.6 0.7  PROT 7.1 6.1* 6.0* 5.7*  ALBUMIN 3.5 3.0* 2.8* 2.6*   No results for input(s): LIPASE, AMYLASE in the last 168 hours. No results for input(s): AMMONIA in the last 168 hours. Coagulation  Profile: Recent Labs  Lab 05/14/20 2243  INR 1.0   Cardiac Enzymes: No results for input(s): CKTOTAL, CKMB, CKMBINDEX, TROPONINI in the last 168 hours. BNP (last 3 results) No results for input(s): PROBNP in the last 8760 hours. HbA1C: No results for input(s): HGBA1C in the last 72 hours. CBG: No results for input(s): GLUCAP in the last 168 hours. Lipid Profile: No results for input(s): CHOL, HDL, LDLCALC, TRIG, CHOLHDL, LDLDIRECT in the last 72 hours. Thyroid Function Tests: No results for input(s): TSH, T4TOTAL, FREET4, T3FREE, THYROIDAB in the last 72 hours. Anemia Panel: Recent Labs    05/16/20 0455 05/17/20 0128  FERRITIN 52 45   Sepsis Labs: Recent Labs  Lab 05/14/20 2243 05/14/20 2333 05/15/20 1147 05/16/20 0455 05/17/20 0128  PROCALCITON  --   --  <0.10 <0.10 <0.10  LATICACIDVEN 1.3 1.0  --   --   --     Recent Results (from the past 240 hour(s))  Blood culture (routine x 2)     Status: None (Preliminary result)   Collection Time: 05/14/20  8:08 PM   Specimen: BLOOD  Result Value Ref Range Status   Specimen Description BLOOD LEFT ANTECUBITAL  Final   Special Requests   Final    BOTTLES DRAWN AEROBIC AND ANAEROBIC Blood Culture results may not be optimal due to an inadequate volume of blood received in culture bottles   Culture   Final    NO GROWTH 2 DAYS Performed at [facility], [address] [place], [place] [address]    Report Status PENDING  Incomplete  Blood culture (routine x 2)     Status: None (Preliminary result)   Collection Time: 05/14/20  8:08 PM   Specimen: BLOOD  Result Value Ref Range Status   Specimen Description BLOOD RIGHT ANTECUBITAL  Final   Special Requests   Final    BOTTLES DRAWN AEROBIC AND ANAEROBIC Blood Culture results may not be optimal due to an inadequate volume of blood received in culture bottles   Culture   Final    NO GROWTH 2 DAYS Performed at [facility], [address], [place], [place]  [address]    Report Status PENDING  Incomplete  Resp Panel by RT-PCR (Flu A&B, Covid)     Status: Abnormal   Collection Time: 05/14/20 10:43 PM  Result Value Ref Range Status   SARS Coronavirus 2 by RT PCR POSITIVE (A) NEGATIVE Final    Comment: RESULT CALLED TO, READ BACK BY AND VERIFIED WITH: KASSIE FELTS ON 05/14/20 AT 2344 QSD (NOTE) SARS-CoV-2 target nucleic acids are DETECTED.  The SARS-CoV-2 RNA is generally detectable in upper respiratory specimens during the acute phase of infection. Positive results are indicative of the presence of the identified virus, but do not rule out bacterial infection or co-infection with other pathogens not detected by the test. Clinical correlation with patient history and other diagnostic information is necessary to determine patient infection status. The expected result is Negative.  Fact Sheet for Patients: EntrepreneurPulse.com.au  Fact Sheet for Healthcare Providers: IncredibleEmployment.be  This test is not yet approved or cleared by the [place] FDA and  has been authorized for detection and/or  diagnosis of SARS-CoV-2 by FDA under an Emergency Use Authorization (EUA).  This EUA will remain in effect (meaning this test can b e used) for the duration of  the COVID-19 declaration under Section 564(b)(1) of the Act, 21 U.S.C. section 360bbb-3(b)(1), unless the authorization is terminated or revoked sooner.     Influenza A by PCR NEGATIVE NEGATIVE Final   Influenza B by PCR NEGATIVE NEGATIVE Final    Comment: (NOTE) The Xpert Xpress SARS-CoV-2/FLU/RSV plus assay is intended as an aid in the diagnosis of influenza from Nasopharyngeal swab specimens and should not be used as a sole basis for treatment. Nasal washings and aspirates are unacceptable for Xpert Xpress SARS-CoV-2/FLU/RSV testing.  Fact Sheet for Patients: EntrepreneurPulse.com.au  Fact Sheet for Healthcare  Providers: IncredibleEmployment.be  This test is not yet approved or cleared by the [place] FDA and has been authorized for detection and/or diagnosis of SARS-CoV-2 by FDA under an Emergency Use Authorization (EUA). This EUA will remain in effect (meaning this test can be used) for the duration of the COVID-19 declaration under Section 564(b)(1) of the Act, 21 U.S.C. section 360bbb-3(b)(1), unless the authorization is terminated or revoked.  Performed at [facility], [address] [place], [place] [address]      Radiology Studies: MR FOOT RIGHT WO CONTRAST  Result Date: 05/16/2020 CLINICAL DATA:  Right foot swelling and redness. EXAM: MRI OF THE RIGHT FOREFOOT WITHOUT CONTRAST TECHNIQUE: Multiplanar, multisequence MR imaging of the right foot was performed. No intravenous contrast was administered. COMPARISON:  Right foot x-rays dated May 14, 2020. FINDINGS: Bones/Joint/Cartilage No marrow signal abnormality. No acute fracture or dislocation. Old healed fracture of the base of the fifth metatarsal. Hallux valgus deformity. Mild first MTP joint osteoarthritis with small joint effusion. Mild midfoot osteoarthritis. Ligaments Collateral ligaments are intact. Muscles and Tendons Flexor and extensor tendons are intact. No tenosynovitis. Diffuse fatty atrophy of the intrinsic foot muscles. Soft tissue Diffuse soft tissue swelling. No fluid collection or hematoma. No soft tissue mass. IMPRESSION: 1. Diffuse soft tissue swelling, likely reflecting cellulitis. No abscess or osteomyelitis. Electronically Signed   By: Titus Dubin M.D.   On: 05/16/2020 09:21     LOS: 3 days   Antonieta Pert, MD [facility]  05/17/2020, 7:53 AM

## 2020-05-17 NOTE — Progress Notes (Signed)
Wound care ordered.  Pt refused wound care and refused dressing to be removed. Pt said wound care was done in the morning. Dressing CDI.

## 2020-05-18 DIAGNOSIS — U071 COVID-19: Secondary | ICD-10-CM | POA: Diagnosis not present

## 2020-05-18 DIAGNOSIS — J9621 Acute and chronic respiratory failure with hypoxia: Secondary | ICD-10-CM

## 2020-05-18 DIAGNOSIS — L03115 Cellulitis of right lower limb: Secondary | ICD-10-CM | POA: Diagnosis not present

## 2020-05-18 LAB — COMPREHENSIVE METABOLIC PANEL
ALT: 58 U/L — ABNORMAL HIGH (ref 0–44)
AST: 73 U/L — ABNORMAL HIGH (ref 15–41)
Albumin: 2.6 g/dL — ABNORMAL LOW (ref 3.5–5.0)
Alkaline Phosphatase: 130 U/L — ABNORMAL HIGH (ref 38–126)
Anion gap: 9 (ref 5–15)
BUN: 24 mg/dL — ABNORMAL HIGH (ref 8–23)
CO2: 32 mmol/L (ref 22–32)
Calcium: 8.4 mg/dL — ABNORMAL LOW (ref 8.9–10.3)
Chloride: 91 mmol/L — ABNORMAL LOW (ref 98–111)
Creatinine, Ser: 0.51 mg/dL — ABNORMAL LOW (ref 0.61–1.24)
GFR, Estimated: >60 mL/min (ref >60)
Glucose, Bld: 83 mg/dL (ref 70–99)
Potassium: 3.9 mmol/L (ref 3.5–5.1)
Sodium: 132 mmol/L — ABNORMAL LOW (ref 135–145)
Total Bilirubin: 0.7 mg/dL (ref 0.3–1.2)
Total Protein: 5.6 g/dL — ABNORMAL LOW (ref 6.5–8.1)

## 2020-05-18 LAB — CBC
HCT: 36.2 % — ABNORMAL LOW (ref 39.0–52.0)
Hemoglobin: 12.5 g/dL — ABNORMAL LOW (ref 13.0–17.0)
MCH: 30.3 pg (ref 26.0–34.0)
MCHC: 34.5 g/dL (ref 30.0–36.0)
MCV: 87.7 fL (ref 80.0–100.0)
Platelets: 242 10*3/uL (ref 150–400)
RBC: 4.13 MIL/uL — ABNORMAL LOW (ref 4.22–5.81)
RDW: 14.7 % (ref 11.5–15.5)
WBC: 8.5 10*3/uL (ref 4.0–10.5)
nRBC: 0 % (ref 0.0–0.2)

## 2020-05-18 LAB — C-REACTIVE PROTEIN: CRP: 2.8 mg/dL — ABNORMAL HIGH (ref <1.0)

## 2020-05-18 LAB — D-DIMER, QUANTITATIVE: D-Dimer, Quant: 1.4 ug/mL-FEU — ABNORMAL HIGH (ref 0.00–0.50)

## 2020-05-18 LAB — FERRITIN: Ferritin: 69 ng/mL (ref 24–336)

## 2020-05-18 NOTE — NC FL2 (Signed)
[place] LEVEL OF CARE SCREENING TOOL     IDENTIFICATION  Patient Name: Jeremiah [name] Birthdate: February 09, 1951 Sex: male Admission Date (Current Location): 05/14/2020  [place] and [place] Number:  Engineering geologist and Address:  [facility], [address] [place], [place] [address]      Provider Number: [id number]  Attending Physician Name and Address:  Wyvonnia Dusky, MD  Relative Name and Phone Number:  N/A    Current Level of Care: SNF Recommended Level of Care: [place] Prior Approval Number:    Date Approved/Denied:   PASRR Number: [id number]  Discharge Plan: SNF    Current Diagnoses: Patient Active Problem List   Diagnosis Date Noted  . Cellulitis 05/15/2020  . COPD with chronic bronchitis ([place]) 05/14/2020  . Nicotine dependence 05/14/2020  . Chronic respiratory failure with hypoxia ([place]) 05/14/2020  . Cellulitis of right foot 05/14/2020  . Hyponatremia 05/14/2020  . Sepsis ([place]) 05/14/2020  . HTN (hypertension) 05/14/2020  . Abnormal LFTs 05/14/2020  . Acute on chronic respiratory failure with hypoxia ([place]) 10/15/2016  . Acute bronchitis 10/15/2016  . Tobacco dependency 10/15/2016  . COPD with acute exacerbation ([place]) 07/28/2015    Orientation RESPIRATION BLADDER Height & Weight     Self,Time,Situation,Place  O2 ([place] 2L) External catheter Weight: 93.6 kg Height:  6' (182.9 cm)  BEHAVIORAL SYMPTOMS/MOOD NEUROLOGICAL BOWEL NUTRITION STATUS      Continent Diet (regular diet)  AMBULATORY STATUS COMMUNICATION OF NEEDS Skin   Extensive Assist Verbally Other (Comment) (cellulitus both lower legs)                       Personal Care Assistance Level of Assistance  Bathing,Feeding,Dressing Bathing Assistance: Limited assistance Feeding assistance: Limited assistance Dressing Assistance: Limited assistance     Functional Limitations Info  Hearing   Hearing Info: Impaired       SPECIAL CARE FACTORS FREQUENCY  PT (By licensed PT),OT (By licensed OT)     PT Frequency: 5 times per week OT Frequency: 5 times per week            Contractures Contractures Info: Not present    Additional Factors Info  Code Status,Allergies Code Status Info: Full Allergies Info: PCN           Current Medications (05/18/2020):  This is the current hospital active medication list Current Facility-Administered Medications  Medication Dose Route Frequency Provider Last Rate Last Admin  . acetaminophen (TYLENOL) tablet 650 mg  650 mg Oral Q6H PRN Antonieta Pert, MD   650 mg at 05/18/20 1021  . albuterol (VENTOLIN HFA) 108 (90 Base) MCG/ACT inhaler 2 puff  2 puff Inhalation Q6H PRN Antonieta Pert, MD   2 puff at 05/17/20 2026  . atorvastatin (LIPITOR) tablet 40 mg  40 mg Oral Daily Kc, Ramesh, MD   40 mg at 05/18/20 1008  . aztreonam (AZACTAM) 2 g in sodium chloride 0.9 % 100 mL IVPB  2 g Intravenous Q8H Kc, Ramesh, MD 200 mL/hr at 05/18/20 0638 2 g at 05/18/20 2671  . enoxaparin (LOVENOX) injection 40 mg  40 mg Subcutaneous Q24H Judd Gaudier V, MD   40 mg at 05/18/20 1007  . fluticasone furoate-vilanterol (BREO ELLIPTA) 100-25 MCG/INH 1 puff  1 puff Inhalation Daily Kc, Ramesh, MD   1 puff at 05/18/20 1015  . influenza vaccine adjuvanted (FLUAD) injection 0.5 mL  0.5 mL Intramuscular Tomorrow-1000 Antonieta Pert, MD      .  Ipratropium-Albuterol (COMBIVENT) respimat 1 puff  1 puff Inhalation Q6H Athena Masse, MD   1 puff at 05/18/20 1007  . montelukast (SINGULAIR) tablet 10 mg  10 mg Oral QHS Antonieta Pert, MD   10 mg at 05/17/20 2013  . nicotine (NICODERM CQ - dosed in mg/24 hours) patch 21 mg  21 mg Transdermal Daily Athena Masse, MD   21 mg at 05/18/20 1007  . oxyCODONE (Oxy IR/ROXICODONE) immediate release tablet 5 mg  5 mg Oral Q4H PRN Athena Masse, MD   5 mg at 05/18/20 1021  . pantoprazole (PROTONIX) EC tablet 20 mg  20 mg Oral Daily Kc, Ramesh, MD   20 mg at 05/18/20 1022  .  predniSONE (DELTASONE) tablet 40 mg  40 mg Oral Q breakfast Athena Masse, MD   40 mg at 05/18/20 1008  . remdesivir 100 mg in sodium chloride 0.9 % 100 mL IVPB  100 mg Intravenous Daily Dorothe Pea, RPH 200 mL/hr at 05/18/20 1011 100 mg at 05/18/20 1011  . tiotropium (SPIRIVA) inhalation capsule ([facility] use ONLY) 18 mcg  18 mcg Inhalation Daily Antonieta Pert, MD   18 mcg at 05/18/20 1014  . vancomycin (VANCOCIN) IVPB 1000 mg/200 mL premix  1,000 mg Intravenous Q12H Renda Rolls, RPH 200 mL/hr at 05/18/20 1048 1,000 mg at 05/18/20 1048     Discharge Medications: Please see discharge summary for a list of discharge medications.  Relevant Imaging Results:  Relevant Lab Results:   Additional Information SS# [id number]  Shelbie Hutching, RN

## 2020-05-18 NOTE — Progress Notes (Signed)
PROGRESS NOTE    Jeremiah [name]  EEF:[id number] DOB: 1950-10-18 DOA: 05/14/2020 PCP: Herminio Commons, MD   Assessment & Plan:   Cellulitis of right foot: w/ chronic leg edema. Continue w/ wound care. No surgicial debridement needed as per podiatry. MRI of the right foot no abscess or osteomyelitis. Continue on vancomycin and aztreonam.    Sepsis: met criteria w/ leukocytosis, hypotension, tachypnea & cellulitis of right foot  Acute on chronic hypoxic respiratory failure: from COPD exacerbation and COVID-pneumonia. Continue on IV remdesivir, steroids. Continue on bronchodilators and encourage incentive spirometry. Continue on supplemental oxygen.    COVID19 pneumonia: management as stated above  Smoking: smoking cessation counseling  Hyponatremia: trending up today. Will continue to monitor   Transaminitis: likely secondary to [place]. Will continue to monitor    DVT prophylaxis: lovenox Code Status:  Full  Family Communication:  Disposition Plan: depends on PT/OT recs   Consultants:  podiatry  Procedures:   Antimicrobials: vanco, aztreonam  Subjective: Pt c/o fatigue  Objective: Vitals:   05/17/20 1949 05/17/20 2346 05/18/20 0422 05/18/20 0426  BP: 126/89 119/73 134/80   Pulse: 96 89 86   Resp: 20 20 18    Temp: (!) 97.4 F (36.3 C) 97.9 F (36.6 C) 98.2 F (36.8 C)   TempSrc:      SpO2: 97% 97% 96% 93%  Weight:      Height:        Intake/Output Summary (Last 24 hours) at 05/18/2020 0728 Last data filed at 05/18/2020 0453 Gross per 24 hour  Intake 1098.37 ml  Output 2400 ml  Net -1301.63 ml   Filed Weights   05/14/20 1551 05/17/20 1828  Weight: 83.5 kg 93.6 kg    Examination:  General exam: Appears calm and comfortable. Appears disheveled  Respiratory system: diminished breath sounds b/l. No rales  Cardiovascular system: S1 & S2+. No rubs, gallops or clicks.  Gastrointestinal system: Abdomen is nondistended, soft and nontender. Normal  bowel sounds heard. Central nervous system: Alert and oriented. Moves all 4 extremities  Psychiatry: Judgement and insight appear normal. Flat mood and affect.     Data Reviewed: I have personally reviewed following labs and imaging studies  CBC: Recent Labs  Lab 05/14/20 1610 05/14/20 2243 05/15/20 0112 05/16/20 0455 05/17/20 0128 05/18/20 0513  WBC 12.0* 13.1* 11.6* 9.3 14.8* 8.5  NEUTROABS 10.3* 11.1*  --   --   --   --   HGB 13.9 12.5* 12.2* 12.2* 12.3* 12.5*  HCT 40.0 37.0* 35.5* 36.1* 36.8* 36.2*  MCV 86.2 87.3 87.0 87.4 87.8 87.7  PLT 302 280 274 272 255 883   Basic Metabolic Panel: Recent Labs  Lab 05/14/20 1610 05/14/20 2243 05/16/20 0455 05/17/20 0128 05/18/20 0513  NA 125* 127* 130* 131* 132*  K 3.4* 4.0 3.6 3.6 3.9  CL 87* 89* 92* 93* 91*  CO2 27 27 32 29 32  GLUCOSE 117* 96 170* 139* 83  BUN 31* 31* 23 24* 24*  CREATININE 1.09 1.00 0.55* 0.48* 0.51*  CALCIUM 8.5* 8.1* 8.3* 8.1* 8.4*   GFR: Estimated Creatinine Clearance: 103.5 mL/min (A) (by C-G formula based on SCr of 0.51 mg/dL (L)). Liver Function Tests: Recent Labs  Lab 05/14/20 1610 05/14/20 2243 05/16/20 0455 05/17/20 0128 05/18/20 0513  AST 56* 50* 73* 81* 73*  ALT 45* 41 45* 49* 58*  ALKPHOS 188* 158* 156* 143* 130*  BILITOT 0.9 0.9 0.6 0.7 0.7  PROT 7.1 6.1* 6.0* 5.7* 5.6*  ALBUMIN 3.5 3.0*  2.8* 2.6* 2.6*   No results for input(s): LIPASE, AMYLASE in the last 168 hours. No results for input(s): AMMONIA in the last 168 hours. Coagulation Profile: Recent Labs  Lab 05/14/20 2243  INR 1.0   Cardiac Enzymes: No results for input(s): CKTOTAL, CKMB, CKMBINDEX, TROPONINI in the last 168 hours. BNP (last 3 results) No results for input(s): PROBNP in the last 8760 hours. HbA1C: No results for input(s): HGBA1C in the last 72 hours. CBG: Recent Labs  Lab 05/17/20 1828  GLUCAP 120*   Lipid Profile: No results for input(s): CHOL, HDL, LDLCALC, TRIG, CHOLHDL, LDLDIRECT in the last  72 hours. Thyroid Function Tests: No results for input(s): TSH, T4TOTAL, FREET4, T3FREE, THYROIDAB in the last 72 hours. Anemia Panel: Recent Labs    05/17/20 0128 05/18/20 0513  FERRITIN 45 69   Sepsis Labs: Recent Labs  Lab 05/14/20 2243 05/14/20 2333 05/15/20 1147 05/16/20 0455 05/17/20 0128  PROCALCITON  --   --  <0.10 <0.10 <0.10  LATICACIDVEN 1.3 1.0  --   --   --     Recent Results (from the past 240 hour(s))  Blood culture (routine x 2)     Status: None (Preliminary result)   Collection Time: 05/14/20  8:08 PM   Specimen: BLOOD  Result Value Ref Range Status   Specimen Description BLOOD LEFT ANTECUBITAL  Final   Special Requests   Final    BOTTLES DRAWN AEROBIC AND ANAEROBIC Blood Culture results may not be optimal due to an inadequate volume of blood received in culture bottles   Culture   Final    NO GROWTH 3 DAYS Performed at [facility], [address] [place], [place] [address]    Report Status PENDING  Incomplete  Blood culture (routine x 2)     Status: None (Preliminary result)   Collection Time: 05/14/20  8:08 PM   Specimen: BLOOD  Result Value Ref Range Status   Specimen Description BLOOD RIGHT ANTECUBITAL  Final   Special Requests   Final    BOTTLES DRAWN AEROBIC AND ANAEROBIC Blood Culture results may not be optimal due to an inadequate volume of blood received in culture bottles   Culture   Final    NO GROWTH 3 DAYS Performed at [facility], [address] [place], [place] [address]    Report Status PENDING  Incomplete  Resp Panel by RT-PCR (Flu A&B, Covid)     Status: Abnormal   Collection Time: 05/14/20 10:43 PM  Result Value Ref Range Status   SARS Coronavirus 2 by RT PCR POSITIVE (A) NEGATIVE Final    Comment: RESULT CALLED TO, READ BACK BY AND VERIFIED WITH: KASSIE FELTS ON 05/14/20 AT 2344 QSD (NOTE) SARS-CoV-2 target nucleic acids are DETECTED.  The SARS-CoV-2 RNA is generally detectable in upper  respiratory specimens during the acute phase of infection. Positive results are indicative of the presence of the identified virus, but do not rule out bacterial infection or co-infection with other pathogens not detected by the test. Clinical correlation with patient history and other diagnostic information is necessary to determine patient infection status. The expected result is Negative.  Fact Sheet for Patients: EntrepreneurPulse.com.au  Fact Sheet for Healthcare Providers: IncredibleEmployment.be  This test is not yet approved or cleared by the [place] FDA and  has been authorized for detection and/or diagnosis of SARS-CoV-2 by FDA under an Emergency Use Authorization (EUA).  This EUA will remain in effect (meaning this test can b e used)  for the duration of  the COVID-19 declaration under Section 564(b)(1) of the Act, 21 U.S.C. section 360bbb-3(b)(1), unless the authorization is terminated or revoked sooner.     Influenza A by PCR NEGATIVE NEGATIVE Final   Influenza B by PCR NEGATIVE NEGATIVE Final    Comment: (NOTE) The Xpert Xpress SARS-CoV-2/FLU/RSV plus assay is intended as an aid in the diagnosis of influenza from Nasopharyngeal swab specimens and should not be used as a sole basis for treatment. Nasal washings and aspirates are unacceptable for Xpert Xpress SARS-CoV-2/FLU/RSV testing.  Fact Sheet for Patients: EntrepreneurPulse.com.au  Fact Sheet for Healthcare Providers: IncredibleEmployment.be  This test is not yet approved or cleared by the [place] FDA and has been authorized for detection and/or diagnosis of SARS-CoV-2 by FDA under an Emergency Use Authorization (EUA). This EUA will remain in effect (meaning this test can be used) for the duration of the COVID-19 declaration under Section 564(b)(1) of the Act, 21 U.S.C. section 360bbb-3(b)(1), unless the authorization is  terminated or revoked.  Performed at [facility], [address], [place], [place] [address]          Radiology Studies: MR FOOT RIGHT WO CONTRAST  Result Date: 05/16/2020 CLINICAL DATA:  Right foot swelling and redness. EXAM: MRI OF THE RIGHT FOREFOOT WITHOUT CONTRAST TECHNIQUE: Multiplanar, multisequence MR imaging of the right foot was performed. No intravenous contrast was administered. COMPARISON:  Right foot x-rays dated May 14, 2020. FINDINGS: Bones/Joint/Cartilage No marrow signal abnormality. No acute fracture or dislocation. Old healed fracture of the base of the fifth metatarsal. Hallux valgus deformity. Mild first MTP joint osteoarthritis with small joint effusion. Mild midfoot osteoarthritis. Ligaments Collateral ligaments are intact. Muscles and Tendons Flexor and extensor tendons are intact. No tenosynovitis. Diffuse fatty atrophy of the intrinsic foot muscles. Soft tissue Diffuse soft tissue swelling. No fluid collection or hematoma. No soft tissue mass. IMPRESSION: 1. Diffuse soft tissue swelling, likely reflecting cellulitis. No abscess or osteomyelitis. Electronically Signed   By: Titus Dubin M.D.   On: 05/16/2020 09:21        Scheduled Meds: . atorvastatin  40 mg Oral Daily  . enoxaparin (LOVENOX) injection  40 mg Subcutaneous Q24H  . fluticasone furoate-vilanterol  1 puff Inhalation Daily  . influenza vaccine adjuvanted  0.5 mL Intramuscular Tomorrow-1000  . Ipratropium-Albuterol  1 puff Inhalation Q6H  . montelukast  10 mg Oral QHS  . nicotine  21 mg Transdermal Daily  . pantoprazole  20 mg Oral Daily  . predniSONE  40 mg Oral Q breakfast  . sodium hypochlorite   Topical Daily  . tiotropium  18 mcg Inhalation Daily   Continuous Infusions: . aztreonam 2 g (05/18/20 3295)  . remdesivir 100 mg in NS 100 mL Stopped (05/17/20 1130)  . vancomycin Stopped (05/17/20 2234)     LOS: 4 days    Time spent: 33 mins     Wyvonnia Dusky,  MD [facility] Pager [phone]  If 7PM-7AM, please contact night-coverage 05/18/2020, 7:28 AM

## 2020-05-18 NOTE — TOC Initial Note (Signed)
Transition of Care [facility]) - Initial/Assessment Note    Patient Details  Name: Jeremiah [name] MRN: [id number] Date of Birth: 29-May-1950  Transition of [facility]) CM/SW Contact:    Shelbie Hutching, RN Phone Number: 05/18/2020, 1:28 PM  Clinical Narrative:                 Patient admitted to the hospital with COVID, patient is on chronic O2 at 2L currently on [place] 3L.  Patient's oxygen is through [place].  RNCM was able to speak with patient via phone.  Patient reports that he is from home and lives with friends.  Patient is independent at baseline and requires no assistive devices.   Patient does not drive but his roommates take him wherever he needs to go.  Patient is current with PCP. PT is recommending SNF and patient agrees.  Patient has been to [facility] in the past and would agree to go back to [facility].  Bed request will be sent out to [facility] and other facilities in case [facility] cannot offer a bed.    Expected Discharge Plan: Skilled Nursing Facility Barriers to Discharge: SNF Covid   Patient Goals and CMS Choice Patient states their goals for this hospitalization and ongoing recovery are:: Wants to be able to walk and get around independently before going home CMS Medicare.gov Compare Post Acute Care list provided to:: Patient Choice offered to / list presented to : Patient  Expected Discharge Plan and Services Expected Discharge Plan: [place]   Discharge Planning Services: CM Consult Post Acute Care Choice: [place] Living arrangements for the past 2 months: Single Family Home                 DME Arranged: N/A DME Agency: NA       HH Arranged: NA          Prior Living Arrangements/Services Living arrangements for the past 2 months: Single Family Home Lives with:: Friends Patient language and need for interpreter reviewed:: Yes Do you feel safe going back to the place where you live?: Yes      Need for Family Participation in Patient Care: Yes  (Comment) (COVID, weakness) Care giver support system in place?: Yes (comment) (friends)   Criminal Activity/Legal Involvement Pertinent to Current Situation/Hospitalization: No - Comment as needed  Activities of Daily Living Home Assistive Devices/Equipment: Environmental consultant (specify type) ADL Screening (condition at time of admission) Patient's cognitive ability adequate to safely complete daily activities?: Yes Is the patient deaf or have difficulty hearing?: No Does the patient have difficulty seeing, even when wearing glasses/contacts?: No Does the patient have difficulty concentrating, remembering, or making decisions?: No Patient able to express need for assistance with ADLs?: Yes Does the patient have difficulty dressing or bathing?: Yes Independently performs ADLs?: Yes (appropriate for developmental age) Does the patient have difficulty walking or climbing stairs?: Yes Weakness of Legs: Both Weakness of Arms/Hands: Both  Permission Sought/Granted Permission sought to share information with : Case Manager,Other (comment),Facility Sport and exercise psychologist Permission granted to share information with : Yes, Verbal Permission Granted  Share Information with NAME: [name]  Permission granted to share info w AGENCY: [facility]  Permission granted to share info w Relationship: friend     Emotional Assessment   Attitude/Demeanor/Rapport: Engaged Affect (typically observed): Accepting Orientation: : Oriented to Self,Oriented to Place,Oriented to  Time,Oriented to Situation Alcohol / Substance Use: Not Applicable Psych Involvement: No (comment)  Admission diagnosis:  Cellulitis [L03.90] Hyponatremia [E87.1] Cellulitis of  right foot [Z02.585] Cellulitis, unspecified cellulitis site [L03.90] Patient Active Problem List   Diagnosis Date Noted  . Cellulitis 05/15/2020  . COPD with chronic bronchitis ([place]) 05/14/2020  . Nicotine dependence 05/14/2020  . Chronic respiratory failure with hypoxia  ([place]) 05/14/2020  . Cellulitis of right foot 05/14/2020  . Hyponatremia 05/14/2020  . Sepsis ([place]) 05/14/2020  . HTN (hypertension) 05/14/2020  . Abnormal LFTs 05/14/2020  . Acute on chronic respiratory failure with hypoxia ([place]) 10/15/2016  . Acute bronchitis 10/15/2016  . Tobacco dependency 10/15/2016  . COPD with acute exacerbation ([place]) 07/28/2015   PCP:  Theotis Burrow, MD Pharmacy:   [facility] [place] [address] [place] [address] Phone: [phone] Fax: [place], [place] [facility] [place] [address] Phone: [phone] Fax: [phone]     Social Determinants of Health (SDOH) Interventions    Readmission Risk Interventions No flowsheet data found.

## 2020-05-18 NOTE — Progress Notes (Signed)
Pt has refused BLE dressing change 2x. Per pt "you'll can do it at night"

## 2020-05-18 NOTE — Progress Notes (Signed)
Pt refused dressing changed after medicated with tylenol and oxycodone for pain.

## 2020-05-19 DIAGNOSIS — L03115 Cellulitis of right lower limb: Secondary | ICD-10-CM | POA: Diagnosis not present

## 2020-05-19 DIAGNOSIS — J9621 Acute and chronic respiratory failure with hypoxia: Secondary | ICD-10-CM | POA: Diagnosis not present

## 2020-05-19 DIAGNOSIS — U071 COVID-19: Secondary | ICD-10-CM | POA: Diagnosis not present

## 2020-05-19 LAB — COMPREHENSIVE METABOLIC PANEL
ALT: 65 U/L — ABNORMAL HIGH (ref 0–44)
AST: 60 U/L — ABNORMAL HIGH (ref 15–41)
Albumin: 2.5 g/dL — ABNORMAL LOW (ref 3.5–5.0)
Alkaline Phosphatase: 145 U/L — ABNORMAL HIGH (ref 38–126)
Anion gap: 6 (ref 5–15)
BUN: 20 mg/dL (ref 8–23)
CO2: 33 mmol/L — ABNORMAL HIGH (ref 22–32)
Calcium: 8.2 mg/dL — ABNORMAL LOW (ref 8.9–10.3)
Chloride: 94 mmol/L — ABNORMAL LOW (ref 98–111)
Creatinine, Ser: 0.42 mg/dL — ABNORMAL LOW (ref 0.61–1.24)
GFR, Estimated: >60 mL/min (ref >60)
Glucose, Bld: 79 mg/dL (ref 70–99)
Potassium: 4.6 mmol/L (ref 3.5–5.1)
Sodium: 133 mmol/L — ABNORMAL LOW (ref 135–145)
Total Bilirubin: 0.7 mg/dL (ref 0.3–1.2)
Total Protein: 5.6 g/dL — ABNORMAL LOW (ref 6.5–8.1)

## 2020-05-19 LAB — CULTURE, BLOOD (ROUTINE X 2)
Culture: NO GROWTH
Culture: NO GROWTH

## 2020-05-19 LAB — CBC
HCT: 35.9 % — ABNORMAL LOW (ref 39.0–52.0)
Hemoglobin: 11.9 g/dL — ABNORMAL LOW (ref 13.0–17.0)
MCH: 29 pg (ref 26.0–34.0)
MCHC: 33.1 g/dL (ref 30.0–36.0)
MCV: 87.3 fL (ref 80.0–100.0)
Platelets: 244 10*3/uL (ref 150–400)
RBC: 4.11 MIL/uL — ABNORMAL LOW (ref 4.22–5.81)
RDW: 14.3 % (ref 11.5–15.5)
WBC: 7.2 10*3/uL (ref 4.0–10.5)
nRBC: 0 % (ref 0.0–0.2)

## 2020-05-19 LAB — FIBRIN DERIVATIVES D-DIMER (ARMC ONLY): Fibrin derivatives D-dimer (ARMC): 968.35 ng/mL (FEU) — ABNORMAL HIGH (ref 0.00–499.00)

## 2020-05-19 LAB — C-REACTIVE PROTEIN: CRP: 2.6 mg/dL — ABNORMAL HIGH (ref <1.0)

## 2020-05-19 LAB — FERRITIN: Ferritin: 79 ng/mL (ref 24–336)

## 2020-05-19 MED ORDER — ONDANSETRON HCL 4 MG/2ML IJ SOLN
4.0000 mg | Freq: Three times a day (TID) | INTRAMUSCULAR | Status: DC | PRN
  Administered 2020-05-19 – 2020-05-24 (×2): 4 mg via INTRAVENOUS
  Filled 2020-05-19 (×2): qty 2

## 2020-05-19 MED ORDER — ALPRAZOLAM 0.25 MG PO TABS
0.2500 mg | ORAL_TABLET | Freq: Once | ORAL | Status: AC
  Administered 2020-05-19: 12:00:00 0.25 mg via ORAL
  Filled 2020-05-19: qty 1

## 2020-05-19 NOTE — Progress Notes (Signed)
PROGRESS NOTE    Jeremiah [name]  YTK:[id number] DOB: 1950-12-06 DOA: 05/14/2020 PCP: Theotis Burrow, MD   Assessment & Plan:   Cellulitis of right foot: w/ chronic leg edema. Continue w/ wound care. No surgical debridement as per podiatry. MRI of right foot shows no abscess or osteomyelitis. Continue on vanco & aztreonam x 7 days  Sepsis: met criteria w/ leukocytosis, hypotension, tachypnea & cellulitis of right foot. Resolved  Acute on chronic hypoxic respiratory failure: improving slowly. From COPD exacerbation and COVID-pneumonia. Continue on IV remdesivir, steroids. Continue on bronchodilators and encourage incentive spirometry. Continue on supplemental oxygen, baseline is 2L [place]  COVID19 pneumonia: management as stated above   Smoking: smoking cessation counseling  Hyponatremia: trending up today. Will continue to monitor   Transaminitis: likely secondary to [place]. Will continue to monitor   Generalized weakness: PT/OT consulted    DVT prophylaxis: lovenox Code Status:  Full  Family Communication:  Disposition Plan: depends on PT/OT recs  Status is: Inpatient  Remains inpatient appropriate because:Unsafe d/c plan and IV treatments appropriate due to intensity of illness or inability to take PO   Dispo: The patient is from: Home              Anticipated d/c is to: Home vs SNF vs home health               Anticipated d/c date is: 2 days              Patient currently is not medically stable to d/c.         Consultants:  podiatry  Procedures:   Antimicrobials: vanco, aztreonam  Subjective: Pt c/o generalized weakness   Objective: Vitals:   05/18/20 1526 05/18/20 1923 05/19/20 0019 05/19/20 0455  BP: (!) 150/91 (!) 144/83 130/69 135/75  Pulse: 94 77 88 75  Resp: 18 16 (!) 24 18  Temp: 98.5 F (36.9 C) 97.9 F (36.6 C) 97.8 F (36.6 C) 98.2 F (36.8 C)  TempSrc:   Oral   SpO2: 94% 96% 97% 97%  Weight:      Height:         Intake/Output Summary (Last 24 hours) at 05/19/2020 0716 Last data filed at 05/19/2020 0209 Gross per 24 hour  Intake 1140 ml  Output -  Net 1140 ml   Filed Weights   05/14/20 1551 05/17/20 1828  Weight: 83.5 kg 93.6 kg    Examination:  General exam: Appears calm & comfortable. Disheveled   Respiratory system: decreased breath sounds b/l. No wheezes Cardiovascular system: S1/S2+. No clicks or rubs.   Gastrointestinal system: Abd is soft, NT, ND & hypoactive bowel sounds  Central nervous system: Alert and oriented. Moves all 4 extremities  Psychiatry: Judgement and insight appear normal. Flat mood and affect.     Data Reviewed: I have personally reviewed following labs and imaging studies  CBC: Recent Labs  Lab 05/14/20 1610 05/14/20 2243 05/15/20 0112 05/16/20 0455 05/17/20 0128 05/18/20 0513 05/19/20 0557  WBC 12.0* 13.1* 11.6* 9.3 14.8* 8.5 7.2  NEUTROABS 10.3* 11.1*  --   --   --   --   --   HGB 13.9 12.5* 12.2* 12.2* 12.3* 12.5* 11.9*  HCT 40.0 37.0* 35.5* 36.1* 36.8* 36.2* 35.9*  MCV 86.2 87.3 87.0 87.4 87.8 87.7 87.3  PLT 302 280 274 272 255 242 557   Basic Metabolic Panel: Recent Labs  Lab 05/14/20 2243 05/16/20 0455 05/17/20 0128 05/18/20 0513 05/19/20 0557  NA 127*  130* 131* 132* 133*  K 4.0 3.6 3.6 3.9 4.6  CL 89* 92* 93* 91* 94*  CO2 27 32 29 32 33*  GLUCOSE 96 170* 139* 83 79  BUN 31* 23 24* 24* 20  CREATININE 1.00 0.55* 0.48* 0.51* 0.42*  CALCIUM 8.1* 8.3* 8.1* 8.4* 8.2*   GFR: Estimated Creatinine Clearance: 103.5 mL/min (A) (by C-G formula based on SCr of 0.42 mg/dL (L)). Liver Function Tests: Recent Labs  Lab 05/14/20 2243 05/16/20 0455 05/17/20 0128 05/18/20 0513 05/19/20 0557  AST 50* 73* 81* 73* 60*  ALT 41 45* 49* 58* 65*  ALKPHOS 158* 156* 143* 130* 145*  BILITOT 0.9 0.6 0.7 0.7 0.7  PROT 6.1* 6.0* 5.7* 5.6* 5.6*  ALBUMIN 3.0* 2.8* 2.6* 2.6* 2.5*   No results for input(s): LIPASE, AMYLASE in the last 168 hours. No  results for input(s): AMMONIA in the last 168 hours. Coagulation Profile: Recent Labs  Lab 05/14/20 2243  INR 1.0   Cardiac Enzymes: No results for input(s): CKTOTAL, CKMB, CKMBINDEX, TROPONINI in the last 168 hours. BNP (last 3 results) No results for input(s): PROBNP in the last 8760 hours. HbA1C: No results for input(s): HGBA1C in the last 72 hours. CBG: Recent Labs  Lab 05/17/20 1828  GLUCAP 120*   Lipid Profile: No results for input(s): CHOL, HDL, LDLCALC, TRIG, CHOLHDL, LDLDIRECT in the last 72 hours. Thyroid Function Tests: No results for input(s): TSH, T4TOTAL, FREET4, T3FREE, THYROIDAB in the last 72 hours. Anemia Panel: Recent Labs    05/18/20 0513 05/19/20 0557  FERRITIN 69 79   Sepsis Labs: Recent Labs  Lab 05/14/20 2243 05/14/20 2333 05/15/20 1147 05/16/20 0455 05/17/20 0128  PROCALCITON  --   --  <0.10 <0.10 <0.10  LATICACIDVEN 1.3 1.0  --   --   --     Recent Results (from the past 240 hour(s))  Blood culture (routine x 2)     Status: None   Collection Time: 05/14/20  8:08 PM   Specimen: BLOOD  Result Value Ref Range Status   Specimen Description BLOOD LEFT ANTECUBITAL  Final   Special Requests   Final    BOTTLES DRAWN AEROBIC AND ANAEROBIC Blood Culture results may not be optimal due to an inadequate volume of blood received in culture bottles   Culture   Final    NO GROWTH 5 DAYS Performed at [facility], [address] [place], [place] [address]    Report Status 05/19/2020 FINAL  Final  Blood culture (routine x 2)     Status: None   Collection Time: 05/14/20  8:08 PM   Specimen: BLOOD  Result Value Ref Range Status   Specimen Description BLOOD RIGHT ANTECUBITAL  Final   Special Requests   Final    BOTTLES DRAWN AEROBIC AND ANAEROBIC Blood Culture results may not be optimal due to an inadequate volume of blood received in culture bottles   Culture   Final    NO GROWTH 5 DAYS Performed at [facility], [address] [place], [place] [address]    Report Status 05/19/2020 FINAL  Final  Resp Panel by RT-PCR (Flu A&B, Covid)     Status: Abnormal   Collection Time: 05/14/20 10:43 PM  Result Value Ref Range Status   SARS Coronavirus 2 by RT PCR POSITIVE (A) NEGATIVE Final    Comment: RESULT CALLED TO, READ BACK BY AND VERIFIED WITH: KASSIE FELTS ON 05/14/20 AT 2344 QSD (NOTE) SARS-CoV-2 target nucleic acids are DETECTED.  The SARS-CoV-2 RNA is generally detectable in upper respiratory specimens during the acute phase of infection. Positive results are indicative of the presence of the identified virus, but do not rule out bacterial infection or co-infection with other pathogens not detected by the test. Clinical correlation with patient history and other diagnostic information is necessary to determine patient infection status. The expected result is Negative.  Fact Sheet for Patients: EntrepreneurPulse.com.au  Fact Sheet for Healthcare Providers: IncredibleEmployment.be  This test is not yet approved or cleared by the [place] FDA and  has been authorized for detection and/or diagnosis of SARS-CoV-2 by FDA under an Emergency Use Authorization (EUA).  This EUA will remain in effect (meaning this test can b e used) for the duration of  the COVID-19 declaration under Section 564(b)(1) of the Act, 21 U.S.C. section 360bbb-3(b)(1), unless the authorization is terminated or revoked sooner.     Influenza A by PCR NEGATIVE NEGATIVE Final   Influenza B by PCR NEGATIVE NEGATIVE Final    Comment: (NOTE) The Xpert Xpress SARS-CoV-2/FLU/RSV plus assay is intended as an aid in the diagnosis of influenza from Nasopharyngeal swab specimens and should not be used as a sole basis for treatment. Nasal washings and aspirates are unacceptable for Xpert Xpress SARS-CoV-2/FLU/RSV testing.  Fact Sheet for Patients: EntrepreneurPulse.com.au  Fact Sheet  for Healthcare Providers: IncredibleEmployment.be  This test is not yet approved or cleared by the [place] FDA and has been authorized for detection and/or diagnosis of SARS-CoV-2 by FDA under an Emergency Use Authorization (EUA). This EUA will remain in effect (meaning this test can be used) for the duration of the COVID-19 declaration under Section 564(b)(1) of the Act, 21 U.S.C. section 360bbb-3(b)(1), unless the authorization is terminated or revoked.  Performed at [facility], [address] [place], [place] [address]          Radiology Studies: No results found.      Scheduled Meds: . atorvastatin  40 mg Oral Daily  . enoxaparin (LOVENOX) injection  40 mg Subcutaneous Q24H  . fluticasone furoate-vilanterol  1 puff Inhalation Daily  . influenza vaccine adjuvanted  0.5 mL Intramuscular Tomorrow-1000  . Ipratropium-Albuterol  1 puff Inhalation Q6H  . montelukast  10 mg Oral QHS  . nicotine  21 mg Transdermal Daily  . pantoprazole  20 mg Oral Daily  . predniSONE  40 mg Oral Q breakfast  . tiotropium  18 mcg Inhalation Daily   Continuous Infusions: . aztreonam Stopped (05/19/20 0115)  . remdesivir 100 mg in NS 100 mL Stopped (05/18/20 1042)  . vancomycin Stopped (05/19/20 0011)     LOS: 5 days    Time spent: 31 mins     Wyvonnia Dusky, MD [facility] Pager [phone]  If 7PM-7AM, please contact night-coverage 05/19/2020, 7:16 AM

## 2020-05-19 NOTE — Progress Notes (Signed)
Physical Therapy Treatment Patient Details Name: Jeremiah [name] MRN: [id number] DOB: 1950-09-19 Today's Date: 05/19/2020    History of Present Illness Jeremiah [name] is a 70 y.o. male  who presents to the emergency room with a 2-week history of swelling bilateral lower extremities mostly over the right foot. States he has not taken off his shoes in over a week because he is afraid that he is unable to put it back on. PMH: significant for COPD on home oxygen at 2 L with, HTN, nicotine dependence.    PT Comments    Pt is very anxious t/o the session and it took quite a while to convince him to at least try some bed exercises (not open to the idea of sitting or standing despite much discussion/encouragement).  He acknowledges that he need to start doing more activity but repeatedly stated that they were going to give him a bath later and that would tire him out.  Pt was obsessed with his O2 saturations t/o the session; 89-92% t/o the entire time during exercises with very frequent rest breaks and verbal cuing and reinforcement.  Pt did have reasonable strength with most exercises but again needed repeated cuing to muster the effort as he was fearful of too much fatigue despite the modest and rest break filled exercise session.  Reviewed HEP exercises and encouraged him to realistically start doing some on his own and know that he will have to get doing more mobility so as not to get too weak that rehab will be very difficult.  Pt agrees, but still only willing to do bed exercises this date.   Follow Up Recommendations  SNF;Supervision - Intermittent;Supervision for mobility/OOB     Equipment Recommendations  Rolling walker with 5" wheels;3in1 (PT)    Recommendations for Other Services       Precautions / Restrictions Precautions Precautions: Fall Restrictions Weight Bearing Restrictions: No Other Position/Activity Restrictions: b/l foot wounds and terribly long toe nails     Mobility  Bed Mobility               General bed mobility comments: Pt refusing to do any mobility today, anxious about tiring himself out, repeated encouragement to try at least sitting up or rolling fell were ineffective  Transfers                    Ambulation/Gait                 Stairs             Wheelchair Mobility    Modified Rankin (Stroke Patients Only)       Balance                                            Cognition Arousal/Alertness: Awake/alert Behavior During Therapy: Anxious Overall Cognitive Status: Within Functional Limits for tasks assessed                                 General Comments: Pt alert and aware, but was hyper cautious and unwilling to entertain the idea of doing more than some modest bed exercises.      Exercises General Exercises - Lower Extremity Ankle Circles/Pumps: AROM;20 reps Quad Sets: Strengthening;10 reps Short Arc Quad: AROM;10 reps Heel Slides: AAROM;10 reps (  resisted leg extensions) Hip ABduction/ADduction: AROM;Strengthening;10 reps Straight Leg Raises: AROM;10 reps (2 second holds at the top of each rep) Other Exercises Other Exercises: resisted elbow flx/ext X 10    General Comments        Pertinent Vitals/Pain Pain Assessment: 0-10 Pain Score: 5  Pain Location: "All over" when asked if feet or anywhere else was the worst he non-commitaly said maybe    Home Living                      Prior Function            PT Goals (current goals can now be found in the care plan section) Progress towards PT goals: Progressing toward goals    Frequency    Min 2X/week      PT Plan Current plan remains appropriate    Co-evaluation              AM-PAC PT "6 Clicks" Mobility   Outcome Measure  Help needed turning from your back to your side while in a flat bed without using bedrails?: A Lot Help needed moving from lying on your  back to sitting on the side of a flat bed without using bedrails?: A Lot Help needed moving to and from a bed to a chair (including a wheelchair)?: A Lot Help needed standing up from a chair using your arms (e.g., wheelchair or bedside chair)?: A Lot Help needed to walk in hospital room?: Total Help needed climbing 3-5 steps with a railing? : Total 6 Click Score: 10    End of Session Equipment Utilized During Treatment: Oxygen Activity Tolerance: Patient tolerated treatment well (3L, O2 remained 89-92% essentially the entire session) Patient left: with bed alarm set;with call bell/phone within reach Nurse Communication: Mobility status;Other (comment) (O2) PT Visit Diagnosis: Muscle weakness (generalized) (M62.81);Other abnormalities of gait and mobility (R26.89);Difficulty in walking, not elsewhere classified (R26.2)     Time: 5465-6812 PT Time Calculation (min) (ACUTE ONLY): 46 min  Charges:  $Therapeutic Exercise: 23-37 mins $Therapeutic Activity: 8-22 mins                     Jeremiah [name], DPT 05/19/2020, 2:48 PM

## 2020-05-19 NOTE — Progress Notes (Signed)
Daily Progress Note   Subjective  - * No surgery found *  Follow-up right foot wound and ulceration.  Discussed with nursing and patient has been refusing dressing changes.  Objective Vitals:   05/19/20 1000 05/19/20 1128 05/19/20 1230 05/19/20 1516  BP:  131/79  (!) 145/86  Pulse:  86  92  Resp:  20  16  Temp:  97.8 F (36.6 C)  97.6 F (36.4 C)  TempSrc:      SpO2: 94% 94% 90% 93%  Weight:      Height:        Physical Exam: I was able to perform a dressing change to his right foot.  The wound is remarkably improved at this time.  All of the superficial skin slough and contamination has subsided quite a bit.  Still a mild amount of drainage to the dorsal aspect of the foot.  Laboratory CBC    Component Value Date/Time   WBC 7.2 05/19/2020 0557   HGB 11.9 (L) 05/19/2020 0557   HGB 13.6 10/01/2013 0134   HCT 35.9 (L) 05/19/2020 0557   HCT 40.7 10/01/2013 0134   PLT 244 05/19/2020 0557   PLT 308 10/01/2013 0134    BMET    Component Value Date/Time   NA 133 (L) 05/19/2020 0557   NA 136 10/02/2013 0435   K 4.6 05/19/2020 0557   K 4.4 10/02/2013 0435   CL 94 (L) 05/19/2020 0557   CL 104 10/02/2013 0435   CO2 33 (H) 05/19/2020 0557   CO2 26 10/02/2013 0435   GLUCOSE 79 05/19/2020 0557   GLUCOSE 152 (H) 10/02/2013 0435   BUN 20 05/19/2020 0557   BUN 19 (H) 10/02/2013 0435   CREATININE 0.42 (L) 05/19/2020 0557   CREATININE 0.83 10/02/2013 0435   CALCIUM 8.2 (L) 05/19/2020 0557   CALCIUM 9.1 10/02/2013 0435   GFRNONAA >60 05/19/2020 0557   GFRNONAA >60 10/02/2013 0435   GFRAA >60 10/20/2016 0457   GFRAA >60 10/02/2013 0435    Assessment/Planning: Right foot cellulitis with superficial ulcerations   At this point we will discontinue the Dakin solutions.  They can begin calcium alginate dressings changed every day with a padded dressing.  Need to keep the foot elevated.  Erythema is remarkably improved at this time and wound is healing quite nicely.  Also  ordered heel relief boots for bilateral feet.  We will continue to follow while in-house but suspect patient from podiatry standpoint can be discharged when medically stable.  Should follow-up with either podiatry or wound care in the next 2 weeks.    Samara Deist A  05/19/2020, 4:07 PM

## 2020-05-19 NOTE — Progress Notes (Signed)
Prevalon boots applied per podiatry order.

## 2020-05-20 DIAGNOSIS — R531 Weakness: Secondary | ICD-10-CM

## 2020-05-20 DIAGNOSIS — L03115 Cellulitis of right lower limb: Secondary | ICD-10-CM | POA: Diagnosis not present

## 2020-05-20 DIAGNOSIS — J9621 Acute and chronic respiratory failure with hypoxia: Secondary | ICD-10-CM | POA: Diagnosis not present

## 2020-05-20 LAB — COMPREHENSIVE METABOLIC PANEL
ALT: 59 U/L — ABNORMAL HIGH (ref 0–44)
AST: 46 U/L — ABNORMAL HIGH (ref 15–41)
Albumin: 2.5 g/dL — ABNORMAL LOW (ref 3.5–5.0)
Alkaline Phosphatase: 151 U/L — ABNORMAL HIGH (ref 38–126)
Anion gap: 7 (ref 5–15)
BUN: 21 mg/dL (ref 8–23)
CO2: 32 mmol/L (ref 22–32)
Calcium: 8.2 mg/dL — ABNORMAL LOW (ref 8.9–10.3)
Chloride: 92 mmol/L — ABNORMAL LOW (ref 98–111)
Creatinine, Ser: 0.45 mg/dL — ABNORMAL LOW (ref 0.61–1.24)
GFR, Estimated: >60 mL/min (ref >60)
Glucose, Bld: 84 mg/dL (ref 70–99)
Potassium: 4.8 mmol/L (ref 3.5–5.1)
Sodium: 131 mmol/L — ABNORMAL LOW (ref 135–145)
Total Bilirubin: 0.6 mg/dL (ref 0.3–1.2)
Total Protein: 5.4 g/dL — ABNORMAL LOW (ref 6.5–8.1)

## 2020-05-20 LAB — CBC
HCT: 36.6 % — ABNORMAL LOW (ref 39.0–52.0)
Hemoglobin: 12.1 g/dL — ABNORMAL LOW (ref 13.0–17.0)
MCH: 29.5 pg (ref 26.0–34.0)
MCHC: 33.1 g/dL (ref 30.0–36.0)
MCV: 89.3 fL (ref 80.0–100.0)
Platelets: 265 10*3/uL (ref 150–400)
RBC: 4.1 MIL/uL — ABNORMAL LOW (ref 4.22–5.81)
RDW: 14.5 % (ref 11.5–15.5)
WBC: 9.2 10*3/uL (ref 4.0–10.5)
nRBC: 0 % (ref 0.0–0.2)

## 2020-05-20 LAB — FERRITIN: Ferritin: 89 ng/mL (ref 24–336)

## 2020-05-20 LAB — C-REACTIVE PROTEIN: CRP: 1.5 mg/dL — ABNORMAL HIGH (ref <1.0)

## 2020-05-20 MED ORDER — ALPRAZOLAM 0.25 MG PO TABS
0.2500 mg | ORAL_TABLET | Freq: Once | ORAL | Status: AC
  Administered 2020-05-20: 0.25 mg via ORAL
  Filled 2020-05-20: qty 1

## 2020-05-20 NOTE — Progress Notes (Signed)
Occupational Therapy Treatment Patient Details Name: Jeremiah [name] MRN: [id number] DOB: Aug 23, 1950 Today's Date: 05/20/2020    History of present illness Jeremiah [name] is a 70 y.o. male  who presents to the emergency room with a 2-week history of swelling bilateral lower extremities mostly over the right foot. States he has not taken off his shoes in over a week because he is afraid that he is unable to put it back on. PMH: significant for COPD on home oxygen at 2 L with, HTN, nicotine dependence. Incidental finding of COVID + PNA.   OT comments  Pt seen for OT tx this date to f/u re: safety with ADLs/ADL mobility. OT facilitates ed re: importance of OOB Activity to encourage pt to get OOB for therapy today. Pt reports that he can work on that at rehab when he goes next week. OT educates pt that he will not thrive at rehab if he does not start some reasonable level of activity in acute setting to at least maintain current level of function. In addition, Pt requires SUPV/CGA for sup to sit transition. Once pt to EOB sitting with G static sitting balance, OT engages pt in one set x5 reps postural extension with shoulder retraction to increase lung surface area for quality of breathing. Pt with some anxiety r/t attempting seated activity, but OT offers MAX encouragement and education. Last, OT engages pt in washing his face and hands in prep for lunch time with wash cloth and SETUP. Pt tolerates EOB sitting ~6-7 mins. Requires MIN/MOD A for assist back to bed to manage LEs. Pt requires extended time and MAX A For in-bed repositioning for LE comfort 2/2 wounds. Pt left with all needs met, lunch tray in front of him on bed-side table. RN notified that while pt's bed did appear to be wet, he declines OTs attempts to engage him in rolling to allow for linen change citing fatigue.  Will continue to follow acutely. Continue to anticipate that pt will require STR in SNF setting as he is still  demonstrating significant weakness and decreased activity tolerance impeding his performance of fxl tasks.    Follow Up Recommendations  SNF    Equipment Recommendations  3 in 1 bedside commode;Tub/shower seat    Recommendations for Other Services      Precautions / Restrictions Precautions Precautions: Fall Precaution Comments: weeping BLE, MRI, podiatry pending Restrictions Weight Bearing Restrictions: No Other Position/Activity Restrictions: b/l foot wounds and terribly long toe nails       Mobility Bed Mobility Overal bed mobility: Needs Assistance Bed Mobility: Supine to Sit;Sit to Supine     Supine to sit: Supervision;Min guard Sit to supine: Min assist;Mod assist   General bed mobility comments: MIN/MOD to assist wiht LE mgt for back to bed  Transfers                 General transfer comment: pt declines to attempt to CTS with OT.    Balance Overall balance assessment: Needs assistance Sitting-balance support: Feet supported Sitting balance-Leahy Scale: Good Sitting balance - Comments: feet supported, but pt wanting to keep pressure-relief boots on his feet despite transitioning to sitting, so pt unable to comlpetely plant feet for BOS       Standing balance comment: NT                           ADL either performed or assessed with clinical judgement   ADL  Overall ADL's : Needs assistance/impaired     Grooming: Wash/dry face;Sitting;Set up Grooming Details (indicate cue type and reason): tolerates ~6 mins static EOB sittingm SETUP and pacing cues to perform simple UB grooming/hygiene task while seated EOB                                     Vision Patient Visual Report: No change from baseline     Perception     Praxis      Cognition Arousal/Alertness: Awake/alert Behavior During Therapy: Anxious Overall Cognitive Status: Within Functional Limits for tasks assessed                                  General Comments: very cautious, took significant amount of encouragement just to motivate pt to sit EOB. Pt hyper-aware of his lines/leads and monitoring his O2 which is WFL at 96% and above throughout session.        Exercises Other Exercises Other Exercises: OT facilitates ed re: importance of OOB Activity to encourage pt to get OOB for therapy today. Pt reports that he can work on that at rehab when he goes next week. OT educates pt that he will not thrive at rehab if he does not start some reasonable level of activity in acute setting to at least maintain current level of function. In addition, Pt requires SUPV/CGA for sup to sit transition. Once pt to EOB sitting with G static sitting balance, OT engages pt in one set x5 reps postural extension with shoulder retraction to increase lung surface area for quality of breathing. Pt with some anxiety r/t attempting seated activity, but OT offers MAX encouragement and education. Last, OT engages pt in washing his face and hands in prep for lunch time with wash cloth and SETUP. Pt tolerates EOB sitting ~6-7 mins. Requires MIN/MOD A for assist back to bed to manage LEs. Pt requires extended time and MAX A For in-bed repositioning for LE comfort 2/2 wounds. Pt left with all needs met, lunch tray in front of him on bed-side table. RN notified that while pt's bed did appear to be wet, he declines OTs attempts to engage him in rolling to allow for linen change citing fatigue.   Shoulder Instructions       General Comments Pt on 4L throughout session with spO2 maintaining between 96-99%    Pertinent Vitals/ Pain       Pain Assessment: Faces Faces Pain Scale: Hurts little more Pain Location: LEs Pain Descriptors / Indicators: Grimacing Pain Intervention(s): Limited activity within patient's tolerance;Monitored during session  Home Living                                          Prior Functioning/Environment               Frequency  Min 1X/week        Progress Toward Goals  OT Goals(current goals can now be found in the care plan section)  Progress towards OT goals: Progressing toward goals  Acute Rehab OT Goals Patient Stated Goal: regain strength,maintain indpendence with basic mobility. OT Goal Formulation: With patient Time For Goal Achievement: 05/30/20 Potential to Achieve Goals: Good  Plan Discharge plan remains appropriate  Co-evaluation                 AM-PAC OT "6 Clicks" Daily Activity     Outcome Measure   Help from another person eating meals?: None Help from another person taking care of personal grooming?: A Little Help from another person toileting, which includes using toliet, bedpan, or urinal?: A Lot Help from another person bathing (including washing, rinsing, drying)?: A Lot Help from another person to put on and taking off regular upper body clothing?: A Little Help from another person to put on and taking off regular lower body clothing?: A Lot 6 Click Score: 16    End of Session Equipment Utilized During Treatment: Oxygen  OT Visit Diagnosis: Other abnormalities of gait and mobility (R26.89);Muscle weakness (generalized) (M62.81)   Activity Tolerance Patient tolerated treatment well;Other (comment) (pt self-limiting)   Patient Left in bed;with call bell/phone within reach;with bed alarm set   Nurse Communication Mobility status;Other (comment) (notified of O2 status and notified that pt needs bed changed, but declined to let OT help him at this time citing fatigue follwing session.)        Time: 0158-6825 OT Time Calculation (min): 28 min  Charges: OT General Charges $OT Visit: 1 Visit OT Treatments $Self Care/Home Management : 8-22 mins $Therapeutic Activity: 8-22 mins  Gerrianne Scale, MS, OTR/L ascom [phone] 05/20/20, 3:00 PM

## 2020-05-20 NOTE — TOC Progression Note (Signed)
Transition of Care [facility]) - Progression Note    Patient Details  Name: Jeremiah [name] MRN: [id number] Date of Birth: 1950/10/14  Transition of Care [facility] CM/SW Contact  Shelbie Hutching, RN Phone Number: 05/20/2020, 1:23 PM  Clinical Narrative:    [facility] has offered a bed and patient would like to accept bed offer.  [facility] will be able to take the patient on Monday 1/17.     Expected Discharge Plan: Skilled Nursing Facility Barriers to Discharge: SNF Covid  Expected Discharge Plan and Services Expected Discharge Plan: [place]   Discharge Planning Services: CM Consult Post Acute Care Choice: [place] Living arrangements for the past 2 months: Single Family Home                 DME Arranged: N/A DME Agency: NA       HH Arranged: NA           Social Determinants of Health (SDOH) Interventions    Readmission Risk Interventions No flowsheet data found.

## 2020-05-20 NOTE — Progress Notes (Signed)
PROGRESS NOTE    Jeremiah [name]  ESP:[id number] DOB: [birth year] DOA: 05/14/2020 PCP: Theotis Burrow, MD   Assessment & Plan:   Cellulitis of right foot: w/ chronic leg edema. Improving. Continue w/ wound care. No surgical debridement as per podiatry. MRI right foot shows no abscess or osteomyelitis. Will complete abx course today   Sepsis: met criteria w/ leukocytosis, hypotension, tachypnea & cellulitis of right foot. Resolved  Acute on chronic hypoxic respiratory failure: continues to improve. From COPD exacerbation and COVID-pneumonia. Continue on IV remdesivir, steroids. Continue on bronchodilators and encourage incentive spirometry. Continue on supplemental oxygen, baseline is 2L [place]  COVID19 pneumonia: management as above   Smoking: smoking cessation counseling   Hyponatremia: labile. Will continue to monitor    Transaminitis: likely secondary to COVID19  Generalized weakness: PT/OT is recs SNF. Pt is agreeable to SNF   DVT prophylaxis: lovenox Code Status:  Full  Family Communication:  Disposition Plan: likely d/c to SNF  Status is: Inpatient  Remains inpatient appropriate because:Unsafe d/c plan and IV treatments appropriate due to intensity of illness or inability to take PO   Dispo: The patient is from: Home              Anticipated d/c is to: SNF              Anticipated d/c date is: 2 days              Patient currently is not medically stable to d/c.         Consultants:  podiatry  Procedures:   Antimicrobials: vanco, aztreonam  Subjective: Pt c/o malaise and shortness of breath   Objective: Vitals:   05/19/20 1516 05/19/20 2012 05/19/20 2322 05/20/20 0444  BP: (!) 145/86 138/72 130/76 136/70  Pulse: 92 96 91 93  Resp: _0 Temp: 97.6 F (36.4 C) 98.7 F (37.1 C) 98.6 F (37 C) 97.7 F (36.5 C)  TempSrc:      SpO2: 93% 95% 96% 96%  Weight:      Height:        Intake/Output Summary (Last 24 hours) at  05/20/2020 0720 Last data filed at 05/20/2020 0545 Gross per 24 hour  Intake -  Output 1000 ml  Net -1000 ml   Filed Weights   05/14/20 1551 05/17/20 1828  Weight: 83.5 kg 93.6 kg    Examination:  General exam: Appears uncomfortable. Disheveled  Respiratory system: diminished breath sounds b/l Cardiovascular system: S1/S2+. No rubs or clicks    Gastrointestinal system: Abd is soft, NT, ND & hypoactive bowel sounds  Central nervous system: Alert and oriented. Moves all 4 extremities Psychiatry: Judgement and insight appear normal. Flat mood and affect.     Data Reviewed: I have personally reviewed following labs and imaging studies  CBC: Recent Labs  Lab 05/14/20 1610 05/14/20 2243 05/15/20 0112 05/16/20 0455 05/17/20 0128 05/18/20 0513 05/19/20 0557 05/20/20 0549  WBC 12.0* 13.1*   < > 9.3 14.8* 8.5 7.2 9.2  NEUTROABS 10.3* 11.1*  --   --   --   --   --   --   HGB 13.9 12.5*   < > 12.2* 12.3* 12.5* 11.9* 12.1*  HCT 40.0 37.0*   < > 36.1* 36.8* 36.2* 35.9* 36.6*  MCV 86.2 87.3   < > 87.4 87.8 87.7 87.3 89.3  PLT 302 280   < > 272 255 242 244 265   < > = values in this  interval not displayed.   Basic Metabolic Panel: Recent Labs  Lab 05/14/20 2243 05/16/20 0455 05/17/20 0128 05/18/20 0513 05/19/20 0557  NA 127* 130* 131* 132* 133*  K 4.0 3.6 3.6 3.9 4.6  CL 89* 92* 93* 91* 94*  CO2 27 32 29 32 33*  GLUCOSE 96 170* 139* 83 79  BUN 31* 23 24* 24* 20  CREATININE 1.00 0.55* 0.48* 0.51* 0.42*  CALCIUM 8.1* 8.3* 8.1* 8.4* 8.2*   GFR: Estimated Creatinine Clearance: 103.5 mL/min (A) (by C-G formula based on SCr of 0.42 mg/dL (L)). Liver Function Tests: Recent Labs  Lab 05/14/20 2243 05/16/20 0455 05/17/20 0128 05/18/20 0513 05/19/20 0557  AST 50* 73* 81* 73* 60*  ALT 41 45* 49* 58* 65*  ALKPHOS 158* 156* 143* 130* 145*  BILITOT 0.9 0.6 0.7 0.7 0.7  PROT 6.1* 6.0* 5.7* 5.6* 5.6*  ALBUMIN 3.0* 2.8* 2.6* 2.6* 2.5*   No results for input(s): LIPASE,  AMYLASE in the last 168 hours. No results for input(s): AMMONIA in the last 168 hours. Coagulation Profile: Recent Labs  Lab 05/14/20 2243  INR 1.0   Cardiac Enzymes: No results for input(s): CKTOTAL, CKMB, CKMBINDEX, TROPONINI in the last 168 hours. BNP (last 3 results) No results for input(s): PROBNP in the last 8760 hours. HbA1C: No results for input(s): HGBA1C in the last 72 hours. CBG: Recent Labs  Lab 05/17/20 1828  GLUCAP 120*   Lipid Profile: No results for input(s): CHOL, HDL, LDLCALC, TRIG, CHOLHDL, LDLDIRECT in the last 72 hours. Thyroid Function Tests: No results for input(s): TSH, T4TOTAL, FREET4, T3FREE, THYROIDAB in the last 72 hours. Anemia Panel: Recent Labs    05/18/20 0513 05/19/20 0557  FERRITIN 69 79   Sepsis Labs: Recent Labs  Lab 05/14/20 2243 05/14/20 2333 05/15/20 1147 05/16/20 0455 05/17/20 0128  PROCALCITON  --   --  <0.10 <0.10 <0.10  LATICACIDVEN 1.3 1.0  --   --   --     Recent Results (from the past 240 hour(s))  Blood culture (routine x 2)     Status: None   Collection Time: 05/14/20  8:08 PM   Specimen: BLOOD  Result Value Ref Range Status   Specimen Description BLOOD LEFT ANTECUBITAL  Final   Special Requests   Final    BOTTLES DRAWN AEROBIC AND ANAEROBIC Blood Culture results may not be optimal due to an inadequate volume of blood received in culture bottles   Culture   Final    NO GROWTH 5 DAYS Performed at [facility], [address] [place], [place] [address]    Report Status 05/19/2020 FINAL  Final  Blood culture (routine x 2)     Status: None   Collection Time: 05/14/20  8:08 PM   Specimen: BLOOD  Result Value Ref Range Status   Specimen Description BLOOD RIGHT ANTECUBITAL  Final   Special Requests   Final    BOTTLES DRAWN AEROBIC AND ANAEROBIC Blood Culture results may not be optimal due to an inadequate volume of blood received in culture bottles   Culture   Final    NO GROWTH 5 DAYS Performed at  [facility], [address] [place], [place] [address]    Report Status 05/19/2020 FINAL  Final  Resp Panel by RT-PCR (Flu A&B, Covid)     Status: Abnormal   Collection Time: 05/14/20 10:43 PM  Result Value Ref Range Status   SARS Coronavirus 2 by RT PCR POSITIVE (A) NEGATIVE Final  Comment: RESULT CALLED TO, READ BACK BY AND VERIFIED WITH: KASSIE FELTS ON 05/14/20 AT 2344 QSD (NOTE) SARS-CoV-2 target nucleic acids are DETECTED.  The SARS-CoV-2 RNA is generally detectable in upper respiratory specimens during the acute phase of infection. Positive results are indicative of the presence of the identified virus, but do not rule out bacterial infection or co-infection with other pathogens not detected by the test. Clinical correlation with patient history and other diagnostic information is necessary to determine patient infection status. The expected result is Negative.  Fact Sheet for Patients: EntrepreneurPulse.com.au  Fact Sheet for Healthcare Providers: IncredibleEmployment.be  This test is not yet approved or cleared by the [place] FDA and  has been authorized for detection and/or diagnosis of SARS-CoV-2 by FDA under an Emergency Use Authorization (EUA).  This EUA will remain in effect (meaning this test can b e used) for the duration of  the COVID-19 declaration under Section 564(b)(1) of the Act, 21 U.S.C. section 360bbb-3(b)(1), unless the authorization is terminated or revoked sooner.     Influenza A by PCR NEGATIVE NEGATIVE Final   Influenza B by PCR NEGATIVE NEGATIVE Final    Comment: (NOTE) The Xpert Xpress SARS-CoV-2/FLU/RSV plus assay is intended as an aid in the diagnosis of influenza from Nasopharyngeal swab specimens and should not be used as a sole basis for treatment. Nasal washings and aspirates are unacceptable for Xpert Xpress SARS-CoV-2/FLU/RSV testing.  Fact Sheet for  Patients: EntrepreneurPulse.com.au  Fact Sheet for Healthcare Providers: IncredibleEmployment.be  This test is not yet approved or cleared by the [place] FDA and has been authorized for detection and/or diagnosis of SARS-CoV-2 by FDA under an Emergency Use Authorization (EUA). This EUA will remain in effect (meaning this test can be used) for the duration of the COVID-19 declaration under Section 564(b)(1) of the Act, 21 U.S.C. section 360bbb-3(b)(1), unless the authorization is terminated or revoked.  Performed at [facility], [address] [place], [place] [address]          Radiology Studies: No results found.      Scheduled Meds: . atorvastatin  40 mg Oral Daily  . enoxaparin (LOVENOX) injection  40 mg Subcutaneous Q24H  . fluticasone furoate-vilanterol  1 puff Inhalation Daily  . influenza vaccine adjuvanted  0.5 mL Intramuscular Tomorrow-1000  . Ipratropium-Albuterol  1 puff Inhalation Q6H  . montelukast  10 mg Oral QHS  . nicotine  21 mg Transdermal Daily  . pantoprazole  20 mg Oral Daily  . tiotropium  18 mcg Inhalation Daily   Continuous Infusions: . aztreonam Stopped (05/20/20 0533)  . vancomycin Stopped (05/20/20 0533)     LOS: 6 days    Time spent: 30 mins     Wyvonnia Dusky, MD [facility] Pager [phone]  If 7PM-7AM, please contact night-coverage 05/20/2020, 7:20 AM

## 2020-05-21 DIAGNOSIS — L03115 Cellulitis of right lower limb: Secondary | ICD-10-CM | POA: Diagnosis not present

## 2020-05-21 DIAGNOSIS — R531 Weakness: Secondary | ICD-10-CM | POA: Diagnosis not present

## 2020-05-21 DIAGNOSIS — J9621 Acute and chronic respiratory failure with hypoxia: Secondary | ICD-10-CM | POA: Diagnosis not present

## 2020-05-21 LAB — COMPREHENSIVE METABOLIC PANEL
ALT: 52 U/L — ABNORMAL HIGH (ref 0–44)
AST: 34 U/L (ref 15–41)
Albumin: 2.4 g/dL — ABNORMAL LOW (ref 3.5–5.0)
Alkaline Phosphatase: 127 U/L — ABNORMAL HIGH (ref 38–126)
Anion gap: 5 (ref 5–15)
BUN: 18 mg/dL (ref 8–23)
CO2: 35 mmol/L — ABNORMAL HIGH (ref 22–32)
Calcium: 8.2 mg/dL — ABNORMAL LOW (ref 8.9–10.3)
Chloride: 93 mmol/L — ABNORMAL LOW (ref 98–111)
Creatinine, Ser: 0.45 mg/dL — ABNORMAL LOW (ref 0.61–1.24)
GFR, Estimated: >60 mL/min (ref >60)
Glucose, Bld: 84 mg/dL (ref 70–99)
Potassium: 4.8 mmol/L (ref 3.5–5.1)
Sodium: 133 mmol/L — ABNORMAL LOW (ref 135–145)
Total Bilirubin: 0.8 mg/dL (ref 0.3–1.2)
Total Protein: 5.3 g/dL — ABNORMAL LOW (ref 6.5–8.1)

## 2020-05-21 LAB — CBC
HCT: 37.1 % — ABNORMAL LOW (ref 39.0–52.0)
Hemoglobin: 12.3 g/dL — ABNORMAL LOW (ref 13.0–17.0)
MCH: 29.5 pg (ref 26.0–34.0)
MCHC: 33.2 g/dL (ref 30.0–36.0)
MCV: 89 fL (ref 80.0–100.0)
Platelets: 315 10*3/uL (ref 150–400)
RBC: 4.17 MIL/uL — ABNORMAL LOW (ref 4.22–5.81)
RDW: 14.6 % (ref 11.5–15.5)
WBC: 10.4 10*3/uL (ref 4.0–10.5)
nRBC: 0 % (ref 0.0–0.2)

## 2020-05-21 LAB — C-REACTIVE PROTEIN: CRP: 1 mg/dL — ABNORMAL HIGH (ref <1.0)

## 2020-05-21 LAB — FERRITIN: Ferritin: 101 ng/mL (ref 24–336)

## 2020-05-21 NOTE — Progress Notes (Signed)
PROGRESS NOTE    Jeremiah [name]  HQI:[id number] DOB: 12/24/1950 DOA: 05/14/2020 PCP: Theotis Burrow, MD   Assessment & Plan:   Cellulitis of right foot: w/ chronic leg edema. Improving. Continue w/ wound care. No surgical debridement as per podiatry. MRI right foot shows no abscess or osteomyelitis. Completed abx course  Sepsis: met criteria w/ leukocytosis, hypotension, tachypnea & cellulitis of right foot. Resolved  Acute on chronic hypoxic respiratory failure: improving. Secondary to COVID19 pneumonia. Complete remdesivir & steroid course. Continue on bronchodilators and encourage incentive spirometry   COVID19 pneumonia: management as above   Smoking: smoking cessation counseling   Hyponatremia: labile.   Transaminitis: likely secondary to [place]. Trending down.  Generalized weakness: PT/OT recs SNF. Pt will go to [facility] on 05/24/20   DVT prophylaxis: lovenox Code Status:  Full  Family Communication:  Disposition Plan: likely d/c to SNF  Status is: Inpatient  Remains inpatient appropriate because:Unsafe d/c plan and IV treatments appropriate due to intensity of illness or inability to take PO   Dispo: The patient is from: Home              Anticipated d/c is to: SNF              Anticipated d/c date is: 2 days              Patient currently is not medically stable to d/c.         Consultants:  podiatry  Procedures:   Antimicrobials:   Subjective: Pt c/o fatigue   Objective: Vitals:   05/20/20 1516 05/20/20 2042 05/21/20 0129 05/21/20 0537  BP: 108/68 123/72 117/79 114/75  Pulse: (!) 107 91 89 92  Resp: _0 Temp: 98 F (36.7 C) 98 F (36.7 C) 98.2 F (36.8 C) 98.5 F (36.9 C)  TempSrc:      SpO2: 97% 97% 97% 98%  Weight:      Height:        Intake/Output Summary (Last 24 hours) at 05/21/2020 0713 Last data filed at 05/21/2020 0547 Gross per 24 hour  Intake -  Output 750 ml  Net -750 ml   Filed Weights   05/14/20  1551 05/17/20 1828  Weight: 83.5 kg 93.6 kg    Examination:  General exam: Appears calm & comfortable. Disheveled  Respiratory system: decreased breath sounds b/l  Cardiovascular system: S1 & S2+. No clicks or rubs  Gastrointestinal system: Abd is soft, NT, ND & hypoactive bowel sounds  Central nervous system: Alert and oriented. Moves all 4 extremities Psychiatry: Judgement and insight appear normal. Flat mood and affect.     Data Reviewed: I have personally reviewed following labs and imaging studies  CBC: Recent Labs  Lab 05/14/20 1610 05/14/20 2243 05/15/20 0112 05/17/20 0128 05/18/20 0513 05/19/20 0557 05/20/20 0549 05/21/20 0416  WBC 12.0* 13.1*   < > 14.8* 8.5 7.2 9.2 10.4  NEUTROABS 10.3* 11.1*  --   --   --   --   --   --   HGB 13.9 12.5*   < > 12.3* 12.5* 11.9* 12.1* 12.3*  HCT 40.0 37.0*   < > 36.8* 36.2* 35.9* 36.6* 37.1*  MCV 86.2 87.3   < > 87.8 87.7 87.3 89.3 89.0  PLT 302 280   < > 255 242 244 265 315   < > = values in this interval not displayed.   Basic Metabolic Panel: Recent Labs  Lab 05/17/20 0128 05/18/20 0513 05/19/20  9201 05/20/20 0549 05/21/20 0416  NA 131* 132* 133* 131* 133*  K 3.6 3.9 4.6 4.8 4.8  CL 93* 91* 94* 92* 93*  CO2 29 32 33* 32 35*  GLUCOSE 139* 83 79 84 84  BUN 24* 24* _0 CREATININE 0.48* 0.51* 0.42* 0.45* 0.45*  CALCIUM 8.1* 8.4* 8.2* 8.2* 8.2*   GFR: Estimated Creatinine Clearance: 103.5 mL/min (A) (by C-G formula based on SCr of 0.45 mg/dL (L)). Liver Function Tests: Recent Labs  Lab 05/17/20 0128 05/18/20 0513 05/19/20 0557 05/20/20 0549 05/21/20 0416  AST 81* 73* 60* 46* 34  ALT 49* 58* 65* 59* 52*  ALKPHOS 143* 130* 145* 151* 127*  BILITOT 0.7 0.7 0.7 0.6 0.8  PROT 5.7* 5.6* 5.6* 5.4* 5.3*  ALBUMIN 2.6* 2.6* 2.5* 2.5* 2.4*   No results for input(s): LIPASE, AMYLASE in the last 168 hours. No results for input(s): AMMONIA in the last 168 hours. Coagulation Profile: Recent Labs  Lab  05/14/20 2243  INR 1.0   Cardiac Enzymes: No results for input(s): CKTOTAL, CKMB, CKMBINDEX, TROPONINI in the last 168 hours. BNP (last 3 results) No results for input(s): PROBNP in the last 8760 hours. HbA1C: No results for input(s): HGBA1C in the last 72 hours. CBG: Recent Labs  Lab 05/17/20 1828  GLUCAP 120*   Lipid Profile: No results for input(s): CHOL, HDL, LDLCALC, TRIG, CHOLHDL, LDLDIRECT in the last 72 hours. Thyroid Function Tests: No results for input(s): TSH, T4TOTAL, FREET4, T3FREE, THYROIDAB in the last 72 hours. Anemia Panel: Recent Labs    05/20/20 0549 05/21/20 0416  FERRITIN 89 101   Sepsis Labs: Recent Labs  Lab 05/14/20 2243 05/14/20 2333 05/15/20 1147 05/16/20 0455 05/17/20 0128  PROCALCITON  --   --  <0.10 <0.10 <0.10  LATICACIDVEN 1.3 1.0  --   --   --     Recent Results (from the past 240 hour(s))  Blood culture (routine x 2)     Status: None   Collection Time: 05/14/20  8:08 PM   Specimen: BLOOD  Result Value Ref Range Status   Specimen Description BLOOD LEFT ANTECUBITAL  Final   Special Requests   Final    BOTTLES DRAWN AEROBIC AND ANAEROBIC Blood Culture results may not be optimal due to an inadequate volume of blood received in culture bottles   Culture   Final    NO GROWTH 5 DAYS Performed at [facility], [address] [place], [place] [address]    Report Status 05/19/2020 FINAL  Final  Blood culture (routine x 2)     Status: None   Collection Time: 05/14/20  8:08 PM   Specimen: BLOOD  Result Value Ref Range Status   Specimen Description BLOOD RIGHT ANTECUBITAL  Final   Special Requests   Final    BOTTLES DRAWN AEROBIC AND ANAEROBIC Blood Culture results may not be optimal due to an inadequate volume of blood received in culture bottles   Culture   Final    NO GROWTH 5 DAYS Performed at [facility], [address], [place], [place] [address]    Report Status 05/19/2020 FINAL  Final  Resp Panel by  RT-PCR (Flu A&B, Covid)     Status: Abnormal   Collection Time: 05/14/20 10:43 PM  Result Value Ref Range Status   SARS Coronavirus 2 by RT PCR POSITIVE (A) NEGATIVE Final    Comment: RESULT CALLED TO, READ BACK BY AND VERIFIED WITH: KASSIE FELTS ON 05/14/20 AT 2344  QSD (NOTE) SARS-CoV-2 target nucleic acids are DETECTED.  The SARS-CoV-2 RNA is generally detectable in upper respiratory specimens during the acute phase of infection. Positive results are indicative of the presence of the identified virus, but do not rule out bacterial infection or co-infection with other pathogens not detected by the test. Clinical correlation with patient history and other diagnostic information is necessary to determine patient infection status. The expected result is Negative.  Fact Sheet for Patients: EntrepreneurPulse.com.au  Fact Sheet for Healthcare Providers: IncredibleEmployment.be  This test is not yet approved or cleared by the [place] FDA and  has been authorized for detection and/or diagnosis of SARS-CoV-2 by FDA under an Emergency Use Authorization (EUA).  This EUA will remain in effect (meaning this test can b e used) for the duration of  the COVID-19 declaration under Section 564(b)(1) of the Act, 21 U.S.C. section 360bbb-3(b)(1), unless the authorization is terminated or revoked sooner.     Influenza A by PCR NEGATIVE NEGATIVE Final   Influenza B by PCR NEGATIVE NEGATIVE Final    Comment: (NOTE) The Xpert Xpress SARS-CoV-2/FLU/RSV plus assay is intended as an aid in the diagnosis of influenza from Nasopharyngeal swab specimens and should not be used as a sole basis for treatment. Nasal washings and aspirates are unacceptable for Xpert Xpress SARS-CoV-2/FLU/RSV testing.  Fact Sheet for Patients: EntrepreneurPulse.com.au  Fact Sheet for Healthcare Providers: IncredibleEmployment.be  This test is not  yet approved or cleared by the [place] FDA and has been authorized for detection and/or diagnosis of SARS-CoV-2 by FDA under an Emergency Use Authorization (EUA). This EUA will remain in effect (meaning this test can be used) for the duration of the COVID-19 declaration under Section 564(b)(1) of the Act, 21 U.S.C. section 360bbb-3(b)(1), unless the authorization is terminated or revoked.  Performed at [facility], [address] [place], [place] [address]          Radiology Studies: No results found.      Scheduled Meds: . atorvastatin  40 mg Oral Daily  . enoxaparin (LOVENOX) injection  40 mg Subcutaneous Q24H  . fluticasone furoate-vilanterol  1 puff Inhalation Daily  . influenza vaccine adjuvanted  0.5 mL Intramuscular Tomorrow-1000  . Ipratropium-Albuterol  1 puff Inhalation Q6H  . montelukast  10 mg Oral QHS  . nicotine  21 mg Transdermal Daily  . pantoprazole  20 mg Oral Daily  . tiotropium  18 mcg Inhalation Daily   Continuous Infusions:    LOS: 7 days    Time spent: 30 mins     Wyvonnia Dusky, MD [facility] Pager [phone]  If 7PM-7AM, please contact night-coverage 05/21/2020, 7:13 AM

## 2020-05-21 NOTE — Progress Notes (Signed)
PT Cancellation Note  Patient Details Name: Jeremiah [name] MRN: [id number] DOB: [birth year]   Cancelled Treatment:     PT attempt. Pt long sitting in bed upon arriving. Pt refused PT at this time 2/2 to stating" I've had a long morning and just need to rest. Max encouragement however pt demanded therapy return another day. Therapist did review bed level exercises and need to continue to participate to improve. " I promise I'll work in the morning." Acute PT will continue to follow and progress as able per POC.      Willette Pa 05/21/2020, 11:50 AM

## 2020-05-22 DIAGNOSIS — J9621 Acute and chronic respiratory failure with hypoxia: Secondary | ICD-10-CM | POA: Diagnosis not present

## 2020-05-22 DIAGNOSIS — L03115 Cellulitis of right lower limb: Secondary | ICD-10-CM | POA: Diagnosis not present

## 2020-05-22 DIAGNOSIS — R531 Weakness: Secondary | ICD-10-CM | POA: Diagnosis not present

## 2020-05-22 LAB — COMPREHENSIVE METABOLIC PANEL
ALT: 47 U/L — ABNORMAL HIGH (ref 0–44)
AST: 31 U/L (ref 15–41)
Albumin: 2.7 g/dL — ABNORMAL LOW (ref 3.5–5.0)
Alkaline Phosphatase: 145 U/L — ABNORMAL HIGH (ref 38–126)
Anion gap: 6 (ref 5–15)
BUN: 18 mg/dL (ref 8–23)
CO2: 36 mmol/L — ABNORMAL HIGH (ref 22–32)
Calcium: 8.4 mg/dL — ABNORMAL LOW (ref 8.9–10.3)
Chloride: 91 mmol/L — ABNORMAL LOW (ref 98–111)
Creatinine, Ser: 0.51 mg/dL — ABNORMAL LOW (ref 0.61–1.24)
GFR, Estimated: >60 mL/min (ref >60)
Glucose, Bld: 102 mg/dL — ABNORMAL HIGH (ref 70–99)
Potassium: 5.3 mmol/L — ABNORMAL HIGH (ref 3.5–5.1)
Sodium: 133 mmol/L — ABNORMAL LOW (ref 135–145)
Total Bilirubin: 0.8 mg/dL (ref 0.3–1.2)
Total Protein: 5.6 g/dL — ABNORMAL LOW (ref 6.5–8.1)

## 2020-05-22 LAB — CBC
HCT: 35.9 % — ABNORMAL LOW (ref 39.0–52.0)
Hemoglobin: 11.5 g/dL — ABNORMAL LOW (ref 13.0–17.0)
MCH: 29 pg (ref 26.0–34.0)
MCHC: 32 g/dL (ref 30.0–36.0)
MCV: 90.4 fL (ref 80.0–100.0)
Platelets: 335 10*3/uL (ref 150–400)
RBC: 3.97 MIL/uL — ABNORMAL LOW (ref 4.22–5.81)
RDW: 14.4 % (ref 11.5–15.5)
WBC: 10.4 10*3/uL (ref 4.0–10.5)
nRBC: 0 % (ref 0.0–0.2)

## 2020-05-22 LAB — FERRITIN: Ferritin: 113 ng/mL (ref 24–336)

## 2020-05-22 LAB — C-REACTIVE PROTEIN: CRP: 0.8 mg/dL (ref <1.0)

## 2020-05-22 MED ORDER — PATIROMER SORBITEX CALCIUM 8.4 G PO PACK
8.4000 g | PACK | Freq: Every day | ORAL | Status: DC
  Administered 2020-05-22: 10:00:00 8.4 g via ORAL
  Filled 2020-05-22 (×2): qty 1

## 2020-05-22 NOTE — Progress Notes (Signed)
PT Cancellation Note  Patient Details Name: Jeremiah [name] MRN: [id number] DOB: [birth year]   Cancelled Treatment:    Reason Eval/Treat Not Completed: Medical issues which prohibited therapy. Chart reviewed, pt with elevated K+ (5.3) and exertional activity contraindicated at this time. Will f/u as able as pt is medically appropriate.   Lavone Nian, PT, DPT 05/22/20, 8:47 AM    Waunita Schooner 05/22/2020, 8:47 AM

## 2020-05-22 NOTE — Progress Notes (Signed)
PROGRESS NOTE    Jeremiah [name]  KMQ:[id number] DOB: [birth year] DOA: 05/14/2020 PCP: Theotis Burrow, MD   Assessment & Plan:   Cellulitis of right foot: w/ chronic leg edema. Continue w/ wound care.  No surgical debridement as per podiatry. Completed abx course  Sepsis: met criteria w/ leukocytosis, hypotension, tachypnea & cellulitis of right foot. Resolved  Acute on chronic hypoxic respiratory failure: improving. Secondary to COVID19 pneumonia. Completed remdesivir & steroid course. Continue on bronchodilators and encourage incentive spirometry   COVID19 pneumonia: management as above   Hyperkalemia: will start veltassa  Smoking: smoking cessation counseling   Hyponatremia: liable   Transaminitis: likely secondary to [place].   Generalized weakness: PT/OT recs SNF. Will go to [facility] 05/25/19   DVT prophylaxis: lovenox Code Status:  Full  Family Communication:  Disposition Plan: likely d/c to SNF  Status is: Inpatient  Remains inpatient appropriate because:Unsafe d/c plan and IV treatments appropriate due to intensity of illness or inability to take PO   Dispo: The patient is from: Home              Anticipated d/c is to: SNF              Anticipated d/c date is: 2 days              Patient currently is not medically stable to d/c.         Consultants:  podiatry  Procedures:   Antimicrobials:   Subjective: Pt c/o malaise    Objective: Vitals:   05/21/20 1143 05/21/20 1518 05/21/20 2012 05/22/20 0657  BP: 119/76 126/68 118/62 112/64  Pulse: (!) 101 98 94 83  Resp: 20 20 16 16   Temp: 97.6 F (36.4 C) 98 F (36.7 C) 97.6 F (36.4 C) 97.9 F (36.6 C)  TempSrc:   Oral Oral  SpO2: 95% 94% 97% 100%  Weight:      Height:        Intake/Output Summary (Last 24 hours) at 05/22/2020 0716 Last data filed at 05/22/2020 0701 Gross per 24 hour  Intake 960 ml  Output 2825 ml  Net -1865 ml   Filed Weights   05/14/20 1551 05/17/20 1828   Weight: 83.5 kg 93.6 kg    Examination:  General exam: Disheveled. Appears calm & comfortable  Respiratory system: diminished breath sounds b/l Cardiovascular system: S1/S2+. No rubs or gallops  Gastrointestinal system: Abd is soft, NT, ND & hypoactive bowel sounds  Central nervous system: Alert and oriented. Moves all 4 extremities  Psychiatry: Judgement and insight appear normal. Flat mood and affect.     Data Reviewed: I have personally reviewed following labs and imaging studies  CBC: Recent Labs  Lab 05/18/20 0513 05/19/20 0557 05/20/20 0549 05/21/20 0416 05/22/20 0528  WBC 8.5 7.2 9.2 10.4 10.4  HGB 12.5* 11.9* 12.1* 12.3* 11.5*  HCT 36.2* 35.9* 36.6* 37.1* 35.9*  MCV 87.7 87.3 89.3 89.0 90.4  PLT 242 244 265 315 165   Basic Metabolic Panel: Recent Labs  Lab 05/18/20 0513 05/19/20 0557 05/20/20 0549 05/21/20 0416 05/22/20 0528  NA 132* 133* 131* 133* 133*  K 3.9 4.6 4.8 4.8 5.3*  CL 91* 94* 92* 93* 91*  CO2 32 33* 32 35* 36*  GLUCOSE 83 79 84 84 102*  BUN 24* 20 21 18 18   CREATININE 0.51* 0.42* 0.45* 0.45* 0.51*  CALCIUM 8.4* 8.2* 8.2* 8.2* 8.4*   GFR: Estimated Creatinine Clearance: 103.5 mL/min (A) (by C-G formula based on  SCr of 0.51 mg/dL (L)). Liver Function Tests: Recent Labs  Lab 05/18/20 0513 05/19/20 0557 05/20/20 0549 05/21/20 0416 05/22/20 0528  AST 73* 60* 46* 34 31  ALT 58* 65* 59* 52* 47*  ALKPHOS 130* 145* 151* 127* 145*  BILITOT 0.7 0.7 0.6 0.8 0.8  PROT 5.6* 5.6* 5.4* 5.3* 5.6*  ALBUMIN 2.6* 2.5* 2.5* 2.4* 2.7*   No results for input(s): LIPASE, AMYLASE in the last 168 hours. No results for input(s): AMMONIA in the last 168 hours. Coagulation Profile: No results for input(s): INR, PROTIME in the last 168 hours. Cardiac Enzymes: No results for input(s): CKTOTAL, CKMB, CKMBINDEX, TROPONINI in the last 168 hours. BNP (last 3 results) No results for input(s): PROBNP in the last 8760 hours. HbA1C: No results for input(s):  HGBA1C in the last 72 hours. CBG: Recent Labs  Lab 05/17/20 1828  GLUCAP 120*   Lipid Profile: No results for input(s): CHOL, HDL, LDLCALC, TRIG, CHOLHDL, LDLDIRECT in the last 72 hours. Thyroid Function Tests: No results for input(s): TSH, T4TOTAL, FREET4, T3FREE, THYROIDAB in the last 72 hours. Anemia Panel: Recent Labs    05/21/20 0416 05/22/20 0528  FERRITIN 101 113   Sepsis Labs: Recent Labs  Lab 05/15/20 1147 05/16/20 0455 05/17/20 0128  PROCALCITON <0.10 <0.10 <0.10    Recent Results (from the past 240 hour(s))  Blood culture (routine x 2)     Status: None   Collection Time: 05/14/20  8:08 PM   Specimen: BLOOD  Result Value Ref Range Status   Specimen Description BLOOD LEFT ANTECUBITAL  Final   Special Requests   Final    BOTTLES DRAWN AEROBIC AND ANAEROBIC Blood Culture results may not be optimal due to an inadequate volume of blood received in culture bottles   Culture   Final    NO GROWTH 5 DAYS Performed at [facility], [address] [place], [place] [address]    Report Status 05/19/2020 FINAL  Final  Blood culture (routine x 2)     Status: None   Collection Time: 05/14/20  8:08 PM   Specimen: BLOOD  Result Value Ref Range Status   Specimen Description BLOOD RIGHT ANTECUBITAL  Final   Special Requests   Final    BOTTLES DRAWN AEROBIC AND ANAEROBIC Blood Culture results may not be optimal due to an inadequate volume of blood received in culture bottles   Culture   Final    NO GROWTH 5 DAYS Performed at [facility], [address], [place], [place] [address]    Report Status 05/19/2020 FINAL  Final  Resp Panel by RT-PCR (Flu A&B, Covid)     Status: Abnormal   Collection Time: 05/14/20 10:43 PM  Result Value Ref Range Status   SARS Coronavirus 2 by RT PCR POSITIVE (A) NEGATIVE Final    Comment: RESULT CALLED TO, READ BACK BY AND VERIFIED WITH: KASSIE FELTS ON 05/14/20 AT 2344 QSD (NOTE) SARS-CoV-2 target nucleic acids are  DETECTED.  The SARS-CoV-2 RNA is generally detectable in upper respiratory specimens during the acute phase of infection. Positive results are indicative of the presence of the identified virus, but do not rule out bacterial infection or co-infection with other pathogens not detected by the test. Clinical correlation with patient history and other diagnostic information is necessary to determine patient infection status. The expected result is Negative.  Fact Sheet for Patients: EntrepreneurPulse.com.au  Fact Sheet for Healthcare Providers: IncredibleEmployment.be  This test is not yet approved or cleared by the  [place] Architectural technologist and  has been authorized for detection and/or diagnosis of SARS-CoV-2 by FDA under an Print production planner (EUA).  This EUA will remain in effect (meaning this test can b e used) for the duration of  the COVID-19 declaration under Section 564(b)(1) of the Act, 21 U.S.C. section 360bbb-3(b)(1), unless the authorization is terminated or revoked sooner.     Influenza A by PCR NEGATIVE NEGATIVE Final   Influenza B by PCR NEGATIVE NEGATIVE Final    Comment: (NOTE) The Xpert Xpress SARS-CoV-2/FLU/RSV plus assay is intended as an aid in the diagnosis of influenza from Nasopharyngeal swab specimens and should not be used as a sole basis for treatment. Nasal washings and aspirates are unacceptable for Xpert Xpress SARS-CoV-2/FLU/RSV testing.  Fact Sheet for Patients: EntrepreneurPulse.com.au  Fact Sheet for Healthcare Providers: IncredibleEmployment.be  This test is not yet approved or cleared by the [place] FDA and has been authorized for detection and/or diagnosis of SARS-CoV-2 by FDA under an Emergency Use Authorization (EUA). This EUA will remain in effect (meaning this test can be used) for the duration of the COVID-19 declaration under Section 564(b)(1) of the Act, 21  U.S.C. section 360bbb-3(b)(1), unless the authorization is terminated or revoked.  Performed at [facility], [address], [place], [place] [address]          Radiology Studies: No results found.      Scheduled Meds: . atorvastatin  40 mg Oral Daily  . enoxaparin (LOVENOX) injection  40 mg Subcutaneous Q24H  . fluticasone furoate-vilanterol  1 puff Inhalation Daily  . influenza vaccine adjuvanted  0.5 mL Intramuscular Tomorrow-1000  . Ipratropium-Albuterol  1 puff Inhalation Q6H  . montelukast  10 mg Oral QHS  . nicotine  21 mg Transdermal Daily  . pantoprazole  20 mg Oral Daily  . patiromer  8.4 g Oral Daily  . tiotropium  18 mcg Inhalation Daily   Continuous Infusions:    LOS: 8 days    Time spent: 31 mins     Wyvonnia Dusky, MD [facility] Pager [phone]  If 7PM-7AM, please contact night-coverage 05/22/2020, 7:16 AM

## 2020-05-23 DIAGNOSIS — R531 Weakness: Secondary | ICD-10-CM | POA: Diagnosis not present

## 2020-05-23 DIAGNOSIS — J9621 Acute and chronic respiratory failure with hypoxia: Secondary | ICD-10-CM | POA: Diagnosis not present

## 2020-05-23 DIAGNOSIS — L03115 Cellulitis of right lower limb: Secondary | ICD-10-CM | POA: Diagnosis not present

## 2020-05-23 LAB — COMPREHENSIVE METABOLIC PANEL
ALT: 51 U/L — ABNORMAL HIGH (ref 0–44)
AST: 37 U/L (ref 15–41)
Albumin: 2.6 g/dL — ABNORMAL LOW (ref 3.5–5.0)
Alkaline Phosphatase: 138 U/L — ABNORMAL HIGH (ref 38–126)
Anion gap: 5 (ref 5–15)
BUN: 14 mg/dL (ref 8–23)
CO2: 35 mmol/L — ABNORMAL HIGH (ref 22–32)
Calcium: 8.7 mg/dL — ABNORMAL LOW (ref 8.9–10.3)
Chloride: 93 mmol/L — ABNORMAL LOW (ref 98–111)
Creatinine, Ser: 0.42 mg/dL — ABNORMAL LOW (ref 0.61–1.24)
GFR, Estimated: >60 mL/min (ref >60)
Glucose, Bld: 78 mg/dL (ref 70–99)
Potassium: 4.8 mmol/L (ref 3.5–5.1)
Sodium: 133 mmol/L — ABNORMAL LOW (ref 135–145)
Total Bilirubin: 0.5 mg/dL (ref 0.3–1.2)
Total Protein: 5.6 g/dL — ABNORMAL LOW (ref 6.5–8.1)

## 2020-05-23 LAB — CBC
HCT: 36.9 % — ABNORMAL LOW (ref 39.0–52.0)
Hemoglobin: 12 g/dL — ABNORMAL LOW (ref 13.0–17.0)
MCH: 29.3 pg (ref 26.0–34.0)
MCHC: 32.5 g/dL (ref 30.0–36.0)
MCV: 90.2 fL (ref 80.0–100.0)
Platelets: 405 10*3/uL — ABNORMAL HIGH (ref 150–400)
RBC: 4.09 MIL/uL — ABNORMAL LOW (ref 4.22–5.81)
RDW: 14.6 % (ref 11.5–15.5)
WBC: 11.2 10*3/uL — ABNORMAL HIGH (ref 4.0–10.5)
nRBC: 0 % (ref 0.0–0.2)

## 2020-05-23 LAB — FERRITIN: Ferritin: 143 ng/mL (ref 24–336)

## 2020-05-23 LAB — C-REACTIVE PROTEIN: CRP: 0.8 mg/dL (ref <1.0)

## 2020-05-23 NOTE — Plan of Care (Signed)

## 2020-05-23 NOTE — Progress Notes (Addendum)
PROGRESS NOTE    Jeremiah [name]  [id number]:[id number] DOB: 01/28/1951 DOA: 05/14/2020 PCP: Theotis Burrow, MD   Assessment & Plan:   Cellulitis of right foot: w/ chronic leg edema. Continue w/ wound care. As per podiatry, no surgical debridement. Completed abx course   Sepsis: met criteria w/ leukocytosis, hypotension, tachypnea & cellulitis of right foot. Resolved  Acute on chronic hypoxic respiratory failure: improving. Secondary to COVID19 pneumonia. Completed remdesivir & steroid course. Continue on bronchodilators and encourage incentive spirometry   COVID19 pneumonia: management as above   Thrombocytosis: etiology unclear, likely reactive. Will continue to monitor   Hyperkalemia: resolved  Smoking: smoking cessation counseling   Hyponatremia: labile  Transaminitis: labile, likely secondary to COVID19  Generalized weakness: PT/OT recs SNF. Will go to [facility] 05/24/20   DVT prophylaxis: lovenox Code Status:  Full  Family Communication:  Disposition Plan: likely d/c to SNF  Status is: Inpatient  Remains inpatient appropriate because:Unsafe d/c plan and IV treatments appropriate due to intensity of illness or inability to take PO   Dispo: The patient is from: Home              Anticipated d/c is to: SNF              Anticipated d/c date is: 1 day               Patient currently is medically stable for d/c     Consultants:  podiatry  Procedures:   Antimicrobials:   Subjective: Pt c/o fatigue    Objective: Vitals:   05/22/20 1556 05/22/20 2009 05/23/20 0042 05/23/20 0529  BP: 116/63 115/63 (!) 121/56 (!) 129/57  Pulse: 93 95 92 93  Resp: 18 17 19 19   Temp: 97.8 F (36.6 C) 98.6 F (37 C) 98.7 F (37.1 C) 97.8 F (36.6 C)  TempSrc:      SpO2: 96% 97% 97% 97%  Weight:      Height:        Intake/Output Summary (Last 24 hours) at 05/23/2020 0733 Last data filed at 05/23/2020 0300 Gross per 24 hour  Intake --  Output 3450 ml  Net -3450 ml    Filed Weights   05/14/20 1551 05/17/20 1828  Weight: 83.5 kg 93.6 kg    Examination:  General exam: Disheveled. Appears calm & comfortable   Respiratory system: decreased breath sounds b/l. No rubs or glicks  Cardiovascular system: S1/S2+. No clicks or rubs  Gastrointestinal system: Abd is soft, NT, ND & normal bowel sounds  Central nervous system: Alert and oriented. Moves all 4 extremities   Psychiatry: Judgement and insight appear normal. Normal mood and affect    Data Reviewed: I have personally reviewed following labs and imaging studies  CBC: Recent Labs  Lab 05/19/20 0557 05/20/20 0549 05/21/20 0416 05/22/20 0528 05/23/20 0514  WBC 7.2 9.2 10.4 10.4 11.2*  HGB 11.9* 12.1* 12.3* 11.5* 12.0*  HCT 35.9* 36.6* 37.1* 35.9* 36.9*  MCV 87.3 89.3 89.0 90.4 90.2  PLT 244 265 315 335 948*   Basic Metabolic Panel: Recent Labs  Lab 05/19/20 0557 05/20/20 0549 05/21/20 0416 05/22/20 0528 05/23/20 0514  NA 133* 131* 133* 133* 133*  K 4.6 4.8 4.8 5.3* 4.8  CL 94* 92* 93* 91* 93*  CO2 33* 32 35* 36* 35*  GLUCOSE 79 84 84 102* 78  BUN 20 21 18 18 14   CREATININE 0.42* 0.45* 0.45* 0.51* 0.42*  CALCIUM 8.2* 8.2* 8.2* 8.4* 8.7*   GFR: Estimated  Creatinine Clearance: 103.5 mL/min (A) (by C-G formula based on SCr of 0.42 mg/dL (L)). Liver Function Tests: Recent Labs  Lab 05/19/20 0557 05/20/20 0549 05/21/20 0416 05/22/20 0528 05/23/20 0514  AST 60* 46* 34 31 37  ALT 65* 59* 52* 47* 51*  ALKPHOS 145* 151* 127* 145* 138*  BILITOT 0.7 0.6 0.8 0.8 0.5  PROT 5.6* 5.4* 5.3* 5.6* 5.6*  ALBUMIN 2.5* 2.5* 2.4* 2.7* 2.6*   No results for input(s): LIPASE, AMYLASE in the last 168 hours. No results for input(s): AMMONIA in the last 168 hours. Coagulation Profile: No results for input(s): INR, PROTIME in the last 168 hours. Cardiac Enzymes: No results for input(s): CKTOTAL, CKMB, CKMBINDEX, TROPONINI in the last 168 hours. BNP (last 3 results) No results for input(s):  PROBNP in the last 8760 hours. HbA1C: No results for input(s): HGBA1C in the last 72 hours. CBG: Recent Labs  Lab 05/17/20 1828  GLUCAP 120*   Lipid Profile: No results for input(s): CHOL, HDL, LDLCALC, TRIG, CHOLHDL, LDLDIRECT in the last 72 hours. Thyroid Function Tests: No results for input(s): TSH, T4TOTAL, FREET4, T3FREE, THYROIDAB in the last 72 hours. Anemia Panel: Recent Labs    05/22/20 0528 05/23/20 0514  FERRITIN 113 143   Sepsis Labs: Recent Labs  Lab 05/17/20 0128  PROCALCITON <0.10    Recent Results (from the past 240 hour(s))  Blood culture (routine x 2)     Status: None   Collection Time: 05/14/20  8:08 PM   Specimen: BLOOD  Result Value Ref Range Status   Specimen Description BLOOD LEFT ANTECUBITAL  Final   Special Requests   Final    BOTTLES DRAWN AEROBIC AND ANAEROBIC Blood Culture results may not be optimal due to an inadequate volume of blood received in culture bottles   Culture   Final    NO GROWTH 5 DAYS Performed at [facility], [address] [place], [place] [address]    Report Status 05/19/2020 FINAL  Final  Blood culture (routine x 2)     Status: None   Collection Time: 05/14/20  8:08 PM   Specimen: BLOOD  Result Value Ref Range Status   Specimen Description BLOOD RIGHT ANTECUBITAL  Final   Special Requests   Final    BOTTLES DRAWN AEROBIC AND ANAEROBIC Blood Culture results may not be optimal due to an inadequate volume of blood received in culture bottles   Culture   Final    NO GROWTH 5 DAYS Performed at [facility], [address] [place], [place] [address]    Report Status 05/19/2020 FINAL  Final  Resp Panel by RT-PCR (Flu A&B, Covid)     Status: Abnormal   Collection Time: 05/14/20 10:43 PM  Result Value Ref Range Status   SARS Coronavirus 2 by RT PCR POSITIVE (A) NEGATIVE Final    Comment: RESULT CALLED TO, READ BACK BY AND VERIFIED WITH: KASSIE FELTS ON 05/14/20 AT 2344 QSD (NOTE) SARS-CoV-2 target  nucleic acids are DETECTED.  The SARS-CoV-2 RNA is generally detectable in upper respiratory specimens during the acute phase of infection. Positive results are indicative of the presence of the identified virus, but do not rule out bacterial infection or co-infection with other pathogens not detected by the test. Clinical correlation with patient history and other diagnostic information is necessary to determine patient infection status. The expected result is Negative.  Fact Sheet for Patients: EntrepreneurPulse.com.au  Fact Sheet for Healthcare Providers: IncredibleEmployment.be  This test is not yet approved  or cleared by the [place] and  has been authorized for detection and/or diagnosis of SARS-CoV-2 by FDA under an Emergency Use Authorization (EUA).  This EUA will remain in effect (meaning this test can b e used) for the duration of  the COVID-19 declaration under Section 564(b)(1) of the Act, 21 U.S.C. section 360bbb-3(b)(1), unless the authorization is terminated or revoked sooner.     Influenza A by PCR NEGATIVE NEGATIVE Final   Influenza B by PCR NEGATIVE NEGATIVE Final    Comment: (NOTE) The Xpert Xpress SARS-CoV-2/FLU/RSV plus assay is intended as an aid in the diagnosis of influenza from Nasopharyngeal swab specimens and should not be used as a sole basis for treatment. Nasal washings and aspirates are unacceptable for Xpert Xpress SARS-CoV-2/FLU/RSV testing.  Fact Sheet for Patients: EntrepreneurPulse.com.au  Fact Sheet for Healthcare Providers: IncredibleEmployment.be  This test is not yet approved or cleared by the [place] FDA and has been authorized for detection and/or diagnosis of SARS-CoV-2 by FDA under an Emergency Use Authorization (EUA). This EUA will remain in effect (meaning this test can be used) for the duration of the COVID-19 declaration under Section  564(b)(1) of the Act, 21 U.S.C. section 360bbb-3(b)(1), unless the authorization is terminated or revoked.  Performed at [facility], [address] [place], [place] [address]          Radiology Studies: No results found.      Scheduled Meds: . atorvastatin  40 mg Oral Daily  . enoxaparin (LOVENOX) injection  40 mg Subcutaneous Q24H  . fluticasone furoate-vilanterol  1 puff Inhalation Daily  . influenza vaccine adjuvanted  0.5 mL Intramuscular Tomorrow-1000  . Ipratropium-Albuterol  1 puff Inhalation Q6H  . montelukast  10 mg Oral QHS  . nicotine  21 mg Transdermal Daily  . pantoprazole  20 mg Oral Daily  . tiotropium  18 mcg Inhalation Daily   Continuous Infusions:    LOS: 9 days    Time spent: 30 mins     Wyvonnia Dusky, MD [facility] Pager [phone]  If 7PM-7AM, please contact night-coverage 05/23/2020, 7:33 AM

## 2020-05-23 NOTE — Plan of Care (Signed)
  Problem: Education: Goal: Knowledge of General Education information will improve Description: Including pain rating scale, medication(s)/side effects and non-pharmacologic comfort measures Outcome: Progressing   Problem: Health Behavior/Discharge Planning: Goal: Ability to manage health-related needs will improve Outcome: Progressing   Problem: Clinical Measurements: Goal: Ability to maintain clinical measurements within normal limits will improve Outcome: Progressing Goal: Will remain free from infection Outcome: Progressing Goal: Diagnostic test results will improve Outcome: Progressing Goal: Respiratory complications will improve Outcome: Progressing Goal: Cardiovascular complication will be avoided Outcome: Progressing   Problem: Activity: Goal: Risk for activity intolerance will decrease Outcome: Progressing   Problem: Nutrition: Goal: Adequate nutrition will be maintained Outcome: Progressing   Problem: Coping: Goal: Level of anxiety will decrease Outcome: Progressing   Problem: Elimination: Goal: Will not experience complications related to bowel motility Outcome: Progressing Goal: Will not experience complications related to urinary retention Outcome: Progressing   Problem: Pain Managment: Goal: General experience of comfort will improve Outcome: Progressing   Problem: Safety: Goal: Ability to remain free from injury will improve Outcome: Progressing   Problem: Education: Goal: Knowledge of risk factors and measures for prevention of condition will improve Outcome: Progressing   Problem: Coping: Goal: Psychosocial and spiritual needs will be supported Outcome: Progressing   Problem: Respiratory: Goal: Will maintain a patent airway Outcome: Progressing Goal: Complications related to the disease process, condition or treatment will be avoided or minimized Outcome: Progressing

## 2020-05-24 DIAGNOSIS — R531 Weakness: Secondary | ICD-10-CM | POA: Diagnosis not present

## 2020-05-24 DIAGNOSIS — J9621 Acute and chronic respiratory failure with hypoxia: Secondary | ICD-10-CM | POA: Diagnosis not present

## 2020-05-24 DIAGNOSIS — L03115 Cellulitis of right lower limb: Secondary | ICD-10-CM | POA: Diagnosis not present

## 2020-05-24 LAB — BASIC METABOLIC PANEL
Anion gap: 10 (ref 5–15)
BUN: 19 mg/dL (ref 8–23)
CO2: 31 mmol/L (ref 22–32)
Calcium: 8.8 mg/dL — ABNORMAL LOW (ref 8.9–10.3)
Chloride: 91 mmol/L — ABNORMAL LOW (ref 98–111)
Creatinine, Ser: 0.51 mg/dL — ABNORMAL LOW (ref 0.61–1.24)
GFR, Estimated: >60 mL/min (ref >60)
Glucose, Bld: 110 mg/dL — ABNORMAL HIGH (ref 70–99)
Potassium: 4.5 mmol/L (ref 3.5–5.1)
Sodium: 132 mmol/L — ABNORMAL LOW (ref 135–145)

## 2020-05-24 LAB — CBC
HCT: 33.3 % — ABNORMAL LOW (ref 39.0–52.0)
Hemoglobin: 10.9 g/dL — ABNORMAL LOW (ref 13.0–17.0)
MCH: 29.4 pg (ref 26.0–34.0)
MCHC: 32.7 g/dL (ref 30.0–36.0)
MCV: 89.8 fL (ref 80.0–100.0)
Platelets: 419 10*3/uL — ABNORMAL HIGH (ref 150–400)
RBC: 3.71 MIL/uL — ABNORMAL LOW (ref 4.22–5.81)
RDW: 14.6 % (ref 11.5–15.5)
WBC: 11.2 10*3/uL — ABNORMAL HIGH (ref 4.0–10.5)
nRBC: 0 % (ref 0.0–0.2)

## 2020-05-24 LAB — FERRITIN: Ferritin: 140 ng/mL (ref 24–336)

## 2020-05-24 LAB — C-REACTIVE PROTEIN: CRP: 1.7 mg/dL — ABNORMAL HIGH (ref <1.0)

## 2020-05-24 MED ORDER — OXYCODONE HCL 5 MG PO TABS
5.0000 mg | ORAL_TABLET | ORAL | Status: DC | PRN
  Administered 2020-05-24 – 2020-05-25 (×4): 5 mg via ORAL
  Filled 2020-05-24 (×4): qty 1

## 2020-05-24 NOTE — Progress Notes (Signed)
PROGRESS NOTE    Jeremiah [name]  TCY:[id number] DOB: May 03, 1951 DOA: 05/14/2020 PCP: Theotis Burrow, MD   Assessment & Plan:   Cellulitis of right foot: w/ chronic leg edema. Continue w/ wound care. As per podiatry, no surgical debridement. Completed abx course   Sepsis: met criteria w/ leukocytosis, hypotension, tachypnea & cellulitis of right foot. Resolved  Acute on chronic hypoxic respiratory failure: continues to improve. Secondary to COVID19 pneumonia. Completed steroid & remdesivir course. Encourage incentive spirometry and continue on bronchodilators   COVID19 pneumonia: management as above   Thrombocytosis: etiology unclear, likely reactive. Will continue to monitor   Hyperkalemia: resolved  Smoking: smoking cessation counseling   Hyponatremia:labile   Transaminitis: labile, secondary to COVID19  Generalized weakness: PT/OT recs SNF. Will go to [facility] tomorrow    DVT prophylaxis: lovenox Code Status:  Full  Family Communication:  Disposition Plan: likely d/c to SNF  Status is: Inpatient  Remains inpatient appropriate because:Unsafe d/c plan and IV treatments appropriate due to intensity of illness or inability to take PO   Dispo: The patient is from: Home              Anticipated d/c is to: SNF              Anticipated d/c date is: 1 day               Patient currently is medically stable for d/c     Consultants:  podiatry  Procedures:   Antimicrobials:   Subjective: Pt c/o nausea   Objective: Vitals:   05/23/20 1124 05/23/20 1520 05/23/20 2149 05/24/20 0429  BP: 126/75 115/74 129/66 125/75  Pulse: (!) 108 98 91 93  Resp: 17 16 20 20   Temp: 97.9 F (36.6 C) 98 F (36.7 C) 98.8 F (37.1 C) 98.8 F (37.1 C)  TempSrc:      SpO2: 96% 99% 98% 90%  Weight:      Height:       No intake or output data in the 24 hours ending 05/24/20 0723 Filed Weights   05/14/20 1551 05/17/20 1828  Weight: 83.5 kg 93.6 kg     Examination:  General exam:  Appears uncomfortable. Disheveled Respiratory system: diminished breath sounds b/l  Cardiovascular system: S1/S2+. No rubs or clicks  Gastrointestinal system: Abd is soft, NT, ND & normal bowel sounds  Central nervous system: Alert and oriented. Moves all 4 extremities  Psychiatry: Judgement and insight appear normal. Flat mood and affect    Data Reviewed: I have personally reviewed following labs and imaging studies  CBC: Recent Labs  Lab 05/19/20 0557 05/20/20 0549 05/21/20 0416 05/22/20 0528 05/23/20 0514  WBC 7.2 9.2 10.4 10.4 11.2*  HGB 11.9* 12.1* 12.3* 11.5* 12.0*  HCT 35.9* 36.6* 37.1* 35.9* 36.9*  MCV 87.3 89.3 89.0 90.4 90.2  PLT 244 265 315 335 121*   Basic Metabolic Panel: Recent Labs  Lab 05/19/20 0557 05/20/20 0549 05/21/20 0416 05/22/20 0528 05/23/20 0514  NA 133* 131* 133* 133* 133*  K 4.6 4.8 4.8 5.3* 4.8  CL 94* 92* 93* 91* 93*  CO2 33* 32 35* 36* 35*  GLUCOSE 79 84 84 102* 78  BUN 20 21 18 18 14   CREATININE 0.42* 0.45* 0.45* 0.51* 0.42*  CALCIUM 8.2* 8.2* 8.2* 8.4* 8.7*   GFR: Estimated Creatinine Clearance: 103.5 mL/min (A) (by C-G formula based on SCr of 0.42 mg/dL (L)). Liver Function Tests: Recent Labs  Lab 05/19/20 0557 05/20/20 0549 05/21/20 0416  05/22/20 0528 05/23/20 0514  AST 60* 46* 34 31 37  ALT 65* 59* 52* 47* 51*  ALKPHOS 145* 151* 127* 145* 138*  BILITOT 0.7 0.6 0.8 0.8 0.5  PROT 5.6* 5.4* 5.3* 5.6* 5.6*  ALBUMIN 2.5* 2.5* 2.4* 2.7* 2.6*   No results for input(s): LIPASE, AMYLASE in the last 168 hours. No results for input(s): AMMONIA in the last 168 hours. Coagulation Profile: No results for input(s): INR, PROTIME in the last 168 hours. Cardiac Enzymes: No results for input(s): CKTOTAL, CKMB, CKMBINDEX, TROPONINI in the last 168 hours. BNP (last 3 results) No results for input(s): PROBNP in the last 8760 hours. HbA1C: No results for input(s): HGBA1C in the last 72  hours. CBG: Recent Labs  Lab 05/17/20 1828  GLUCAP 120*   Lipid Profile: No results for input(s): CHOL, HDL, LDLCALC, TRIG, CHOLHDL, LDLDIRECT in the last 72 hours. Thyroid Function Tests: No results for input(s): TSH, T4TOTAL, FREET4, T3FREE, THYROIDAB in the last 72 hours. Anemia Panel: Recent Labs    05/22/20 0528 05/23/20 0514  FERRITIN 113 143   Sepsis Labs: No results for input(s): PROCALCITON, LATICACIDVEN in the last 168 hours.  Recent Results (from the past 240 hour(s))  Blood culture (routine x 2)     Status: None   Collection Time: 05/14/20  8:08 PM   Specimen: BLOOD  Result Value Ref Range Status   Specimen Description BLOOD LEFT ANTECUBITAL  Final   Special Requests   Final    BOTTLES DRAWN AEROBIC AND ANAEROBIC Blood Culture results may not be optimal due to an inadequate volume of blood received in culture bottles   Culture   Final    NO GROWTH 5 DAYS Performed at [facility], [address] [place], [place] [address]    Report Status 05/19/2020 FINAL  Final  Blood culture (routine x 2)     Status: None   Collection Time: 05/14/20  8:08 PM   Specimen: BLOOD  Result Value Ref Range Status   Specimen Description BLOOD RIGHT ANTECUBITAL  Final   Special Requests   Final    BOTTLES DRAWN AEROBIC AND ANAEROBIC Blood Culture results may not be optimal due to an inadequate volume of blood received in culture bottles   Culture   Final    NO GROWTH 5 DAYS Performed at [facility], [address], [place], [place] [address]    Report Status 05/19/2020 FINAL  Final  Resp Panel by RT-PCR (Flu A&B, Covid)     Status: Abnormal   Collection Time: 05/14/20 10:43 PM  Result Value Ref Range Status   SARS Coronavirus 2 by RT PCR POSITIVE (A) NEGATIVE Final    Comment: RESULT CALLED TO, READ BACK BY AND VERIFIED WITH: KASSIE FELTS ON 05/14/20 AT 2344 QSD (NOTE) SARS-CoV-2 target nucleic acids are DETECTED.  The SARS-CoV-2 RNA is generally  detectable in upper respiratory specimens during the acute phase of infection. Positive results are indicative of the presence of the identified virus, but do not rule out bacterial infection or co-infection with other pathogens not detected by the test. Clinical correlation with patient history and other diagnostic information is necessary to determine patient infection status. The expected result is Negative.  Fact Sheet for Patients: EntrepreneurPulse.com.au  Fact Sheet for Healthcare Providers: IncredibleEmployment.be  This test is not yet approved or cleared by the [place] FDA and  has been authorized for detection and/or diagnosis of SARS-CoV-2 by FDA under an Emergency Use Authorization (EUA).  This  EUA will remain in effect (meaning this test can b e used) for the duration of  the COVID-19 declaration under Section 564(b)(1) of the Act, 21 U.S.C. section 360bbb-3(b)(1), unless the authorization is terminated or revoked sooner.     Influenza A by PCR NEGATIVE NEGATIVE Final   Influenza B by PCR NEGATIVE NEGATIVE Final    Comment: (NOTE) The Xpert Xpress SARS-CoV-2/FLU/RSV plus assay is intended as an aid in the diagnosis of influenza from Nasopharyngeal swab specimens and should not be used as a sole basis for treatment. Nasal washings and aspirates are unacceptable for Xpert Xpress SARS-CoV-2/FLU/RSV testing.  Fact Sheet for Patients: EntrepreneurPulse.com.au  Fact Sheet for Healthcare Providers: IncredibleEmployment.be  This test is not yet approved or cleared by the [place] FDA and has been authorized for detection and/or diagnosis of SARS-CoV-2 by FDA under an Emergency Use Authorization (EUA). This EUA will remain in effect (meaning this test can be used) for the duration of the COVID-19 declaration under Section 564(b)(1) of the Act, 21 U.S.C. section 360bbb-3(b)(1), unless the  authorization is terminated or revoked.  Performed at [facility], [address], [place], [place] [address]          Radiology Studies: No results found.      Scheduled Meds: . atorvastatin  40 mg Oral Daily  . enoxaparin (LOVENOX) injection  40 mg Subcutaneous Q24H  . fluticasone furoate-vilanterol  1 puff Inhalation Daily  . influenza vaccine adjuvanted  0.5 mL Intramuscular Tomorrow-1000  . Ipratropium-Albuterol  1 puff Inhalation Q6H  . montelukast  10 mg Oral QHS  . nicotine  21 mg Transdermal Daily  . pantoprazole  20 mg Oral Daily  . tiotropium  18 mcg Inhalation Daily   Continuous Infusions:    LOS: 10 days    Time spent: 31 mins     Wyvonnia Dusky, MD [facility] Pager [phone]  If 7PM-7AM, please contact night-coverage 05/24/2020, 7:23 AM

## 2020-05-24 NOTE — TOC Progression Note (Signed)
Transition of Care [facility]) - Progression Note    Patient Details  Name: Jeremiah [name] MRN: [id number] Date of Birth: 1950-08-05  Transition of Care [facility]) CM/SW Contact  Shelbie Hutching, RN Phone Number: 05/24/2020, 12:11 PM  Clinical Narrative:    Plan for discharge to [facility] tomorrow.  [facility] is unable to accept patient today due to staffing and EMS is not doing non emergent transports due to weather.    Expected Discharge Plan: Skilled Nursing Facility Barriers to Discharge: SNF Covid  Expected Discharge Plan and Services Expected Discharge Plan: [place]   Discharge Planning Services: CM Consult Post Acute Care Choice: [place] Living arrangements for the past 2 months: Single Family Home                 DME Arranged: N/A DME Agency: NA       HH Arranged: NA           Social Determinants of Health (SDOH) Interventions    Readmission Risk Interventions No flowsheet data found.

## 2020-05-24 NOTE — Progress Notes (Signed)
Physical Therapy Treatment Patient Details Name: Jeremiah [name] MRN: [id number] DOB: 06/10/1950 Today's Date: 05/24/2020    History of Present Illness Jeremiah [name] is a 70 y.o. male  who presents to the emergency room with a 2-week history of swelling bilateral lower extremities mostly over the right foot. States he has not taken off his shoes in over a week because he is afraid that he is unable to put it back on. PMH: significant for COPD on home oxygen at 2 L with, HTN, nicotine dependence. Incidental finding of COVID + PNA.    PT Comments    Patient agreeable to PT, finished with breakfast. Overall pt did demonstrate improvement in functional mobility; Often self directive of care and occasionally irritated with PT but redirectable and apologetic at end of session. Pt needed several rest breaks with each bout of activity ~2-33minutes, spO2 lowest reading at 88% after standing. Supine to sit supervision, able sat EOB for several minutes to take medication from RN, good sitting balance noted. Sit <> stand twice, second attempt pt come fully into standing with modA and RW (PT also stabilizing RW). Able to stand ~20seconds or so, did appear fatigued and SOB. Pt returned to supine minA for LE, and repositioned for comfort and all needs in reach, extended time to satisfy patient. PT also assisted with lunch and dinner orders. The patient would benefit from further skilled PT intervention to continue to progress towards goals. Recommendation remains appropriate.      Follow Up Recommendations  SNF;Supervision - Intermittent;Supervision for mobility/OOB     Equipment Recommendations  Rolling walker with 5" wheels;3in1 (PT)    Recommendations for Other Services       Precautions / Restrictions Precautions Precautions: Fall Precaution Comments: foot wounds Restrictions Other Position/Activity Restrictions: b/l foot wounds and terribly long toe nails    Mobility  Bed  Mobility Overal bed mobility: Needs Assistance Bed Mobility: Supine to Sit     Supine to sit: Supervision;HOB elevated Sit to supine: Min assist;HOB elevated   General bed mobility comments: return to supine with minA for LE management and weight shift to middle of bed. Repositioned up the bed with totalx2  Transfers Overall transfer level: Needs assistance Equipment used: Rolling walker (2 wheeled) Transfers: Sit to/from Stand Sit to Stand: Mod assist         General transfer comment: Pt attempted sit <> stand x2; second attempt able to come fully into standing with RW and maintain standing ~20seconds or so.  Ambulation/Gait             General Gait Details: declined due to foot pain, fatigue   Stairs             Wheelchair Mobility    Modified Rankin (Stroke Patients Only)       Balance Overall balance assessment: Needs assistance Sitting-balance support: Feet supported Sitting balance-Leahy Scale: Good Sitting balance - Comments: able to sit for several minutes to take his medicine from RN     Standing balance-Leahy Scale: Poor Standing balance comment: reliant on RW                            Cognition Arousal/Alertness: Awake/alert Behavior During Therapy: Anxious Overall Cognitive Status: Within Functional Limits for tasks assessed  General Comments: cautious, needed continued encouragement to provide. Occasionally irritated with PT      Exercises      General Comments General comments (skin integrity, edema, etc.): pt on 4L throughout session, spo2 after standing 88%, but with rest recovered >90% quickly      Pertinent Vitals/Pain Pain Assessment: Faces Faces Pain Scale: Hurts little more Pain Location: LEs Pain Descriptors / Indicators: Grimacing;Guarding;Sore Pain Intervention(s): Limited activity within patient's tolerance;Monitored during session    Home Living                       Prior Function            PT Goals (current goals can now be found in the care plan section) Progress towards PT goals: Progressing toward goals    Frequency    Min 2X/week      PT Plan Current plan remains appropriate    Co-evaluation              AM-PAC PT "6 Clicks" Mobility   Outcome Measure  Help needed turning from your back to your side while in a flat bed without using bedrails?: A Little Help needed moving from lying on your back to sitting on the side of a flat bed without using bedrails?: A Little Help needed moving to and from a bed to a chair (including a wheelchair)?: A Lot Help needed standing up from a chair using your arms (e.g., wheelchair or bedside chair)?: A Lot Help needed to walk in hospital room?: Total Help needed climbing 3-5 steps with a railing? : Total 6 Click Score: 12    End of Session Equipment Utilized During Treatment: Oxygen Activity Tolerance: Patient tolerated treatment well Patient left: with bed alarm set;with call bell/phone within reach;in bed Nurse Communication: Mobility status;Other (comment) PT Visit Diagnosis: Muscle weakness (generalized) (M62.81);Other abnormalities of gait and mobility (R26.89);Difficulty in walking, not elsewhere classified (R26.2)     Time: 9357-0177 PT Time Calculation (min) (ACUTE ONLY): 43 min  Charges:  $Therapeutic Exercise: 38-52 mins                     Lieutenant Diego PT, DPT 10:51 AM,05/24/20

## 2020-05-25 DIAGNOSIS — D75839 Thrombocytosis, unspecified: Secondary | ICD-10-CM

## 2020-05-25 LAB — BASIC METABOLIC PANEL
Anion gap: 7 (ref 5–15)
BUN: 18 mg/dL (ref 8–23)
CO2: 31 mmol/L (ref 22–32)
Calcium: 8.6 mg/dL — ABNORMAL LOW (ref 8.9–10.3)
Chloride: 96 mmol/L — ABNORMAL LOW (ref 98–111)
Creatinine, Ser: 0.51 mg/dL — ABNORMAL LOW (ref 0.61–1.24)
GFR, Estimated: >60 mL/min (ref >60)
Glucose, Bld: 87 mg/dL (ref 70–99)
Potassium: 4.4 mmol/L (ref 3.5–5.1)
Sodium: 134 mmol/L — ABNORMAL LOW (ref 135–145)

## 2020-05-25 LAB — CBC
HCT: 33.3 % — ABNORMAL LOW (ref 39.0–52.0)
Hemoglobin: 11 g/dL — ABNORMAL LOW (ref 13.0–17.0)
MCH: 29.6 pg (ref 26.0–34.0)
MCHC: 33 g/dL (ref 30.0–36.0)
MCV: 89.5 fL (ref 80.0–100.0)
Platelets: 432 10*3/uL — ABNORMAL HIGH (ref 150–400)
RBC: 3.72 MIL/uL — ABNORMAL LOW (ref 4.22–5.81)
RDW: 14.5 % (ref 11.5–15.5)
WBC: 10.1 10*3/uL (ref 4.0–10.5)
nRBC: 0 % (ref 0.0–0.2)

## 2020-05-25 LAB — C-REACTIVE PROTEIN: CRP: 1.5 mg/dL — ABNORMAL HIGH (ref <1.0)

## 2020-05-25 LAB — FERRITIN: Ferritin: 134 ng/mL (ref 24–336)

## 2020-05-25 MED ORDER — OXYCODONE HCL 5 MG PO TABS: 5.0000 mg | ORAL_TABLET | Freq: Four times a day (QID) | ORAL | 0 refills | Status: AC | PRN

## 2020-05-25 NOTE — Consult Note (Signed)
[place] Nurse Consult Note: Reason for Consult: Previously ordered wound dressing (calcium alginate) to right foot is adhering to wound bed. Podiatry (Dr. Vickki Muff) ordered Ca+ alginate on 05/15/20. Patient is for transfer today to skilled facility. Wound type: full thickness, infectious Pressure Injury POA: N/A Measurement: To be obtained by Bedside RN and documented on Nursing Flow Sheet Wound bed:red, dry Drainage (amount, consistency, odor) small amount serous Periwound: intact, dry Dressing procedure/placement/frequency: Dr. Vickki Muff (Podiatric Medicine) saw patient on 05/15/20 and provided guidance for topical care using a Ca+ alginate. WOC contacted because dressing had sufficiently dried the tissue that was too moist and was now adhering to the wound and creating a traumatic wound change.  I will change the topical care order to an antimicrobial nonadherent (xeroform) until Dr. Vickki Muff can see the patient in the outpatient setting. Patient is for transfer today to a skilled facility.  [place] nursing team will not follow, but will remain available to this patient, the nursing and medical teams.  Please re-consult if needed. Thanks, Maudie Flakes, MSN, RN, [place], Arther Abbott  Pager# [phone]

## 2020-05-25 NOTE — TOC Progression Note (Signed)
Transition of Care [facility]) - Progression Note    Patient Details  Name: Jeremiah [name] MRN: [id number] Date of Birth: 03-04-1951  Transition of Care [facility]) CM/SW Contact  Shelbie Hutching, RN Phone Number: 05/25/2020, 10:26 AM  Clinical Narrative:    Patient is medically cleared for discharge [facility] but [facility] is having staffing issues and may not be able to accept patient today.     Expected Discharge Plan: Skilled Nursing Facility Barriers to Discharge: SNF Covid  Expected Discharge Plan and Services Expected Discharge Plan: [place]   Discharge Planning Services: CM Consult Post Acute Care Choice: [place] Living arrangements for the past 2 months: Single Family Home                 DME Arranged: N/A DME Agency: NA       HH Arranged: NA           Social Determinants of Health (SDOH) Interventions    Readmission Risk Interventions No flowsheet data found.

## 2020-05-25 NOTE — TOC Transition Note (Signed)
Transition of Care [facility]) - CM/SW Discharge Note   Patient Details  Name: Hendricks Schwandt MRN: [id number] Date of Birth: 12/01/1950  Transition of Care [facility]) CM/SW Contact:  Shelbie Hutching, RN Phone Number: 05/25/2020, 1:46 PM   Clinical Narrative:    Patient will discharge to [facility] today.  Bedside RN will call report to [phone].  EMS transport has been arranged.  Patient has several people ahead of him before he will be picked up by EMS, at least 6 ahead.    Final next level of care: Skilled Nursing Facility Barriers to Discharge: Barriers Resolved   Patient Goals and CMS Choice Patient states their goals for this hospitalization and ongoing recovery are:: Wants to be able to walk and get around independently before going home CMS Medicare.gov Compare Post Acute Care list provided to:: Patient Choice offered to / list presented to : Patient  Discharge Placement              Patient chooses bed at: [facility]  Patient to be transferred to facility by: [facility] Name of family member notified: Patient notified Patient and family notified of of transfer: 05/25/20  Discharge Plan and Services   Discharge Planning Services: CM Consult Post Acute Care Choice: [place]          DME Arranged: N/A DME Agency: NA       HH Arranged: NA          Social Determinants of Health (SDOH) Interventions     Readmission Risk Interventions No flowsheet data found.

## 2020-05-25 NOTE — Care Management Important Message (Signed)
Important Message  Patient Details  Name: Jeremiah [name] MRN: [id number] Date of Birth: Oct 02, 1950   Medicare Important Message Given:  Yes     Shelbie Hutching, RN 05/25/2020, 10:25 AM

## 2020-05-25 NOTE — Discharge Summary (Signed)
Physician Discharge Summary  Jeremiah [name] ZOX:[id number] DOB: [birth year] DOA: 05/14/2020  PCP: Theotis Burrow, MD  Admit date: 05/14/2020 Discharge date: 05/25/2020  Admitted From: home  Disposition:  SNF  Recommendations for Outpatient Follow-up:  1. Follow up with PCP in 1-2 weeks 2. F/u w/ podiatry, Dr. Vickki Muff in 2 weeks  Home Health: no Equipment/Devices:  Discharge Condition: stable CODE STATUS: full  Diet recommendation: Heart Healthy  Brief/Interim Summary: HPI was taken from Dr. Damita Dunnings: Merrell Borsuk is a 70 y.o. male with medical history significant for COPD on home oxygen at 2 L with, HTN, nicotine dependence who presents to the emergency room with a 2-week history of swelling bilateral lower extremities mostly over the right foot.  States he has not taken off his shoes in over a week because he is afraid that he is unable to put it back on.  He denies fever or chills. ED Course: On arrival, he was hypotensive at 102/49 with heart rate 63 but tachypneic at 26, O2 sat 88% on room air and 93% on room fluid of 2 L requiring adjustment to 4 L.  Blood work significant for hyponatremia of 125 and elevated LFTs with AST/ALT 56/45 and alk phos 188.  WBC 12,000.  Troponin normal, BNP normal. EKG as reviewed by me : Sinus rhythm at 69 with nonspecific ST-T wave changes Imaging: Chest x-ray showing chronic changes Foot x-ray showed no evidence of osteo but with recommendations for MRI for further evaluation  Hospital course from Dr. Jimmye Norman 1/11-1/18/22: Pt was found to have cellulitis of right foot w/ chronic leg edema. Pt completed unasyn abx course and received wound care as per podiatry/wound care. Pt should f/u outpatient w/ podiatry in 2 weeks. Furthermore, pt was found to COVID19 pneumonia and treated w/ remdesivir, steroids, bronchodilators, incentive spirometry & oxygen. Pt completed his COVID19 treatment while inpatient. Also, PT/OT saw the pt and recommended  SNF. For more information, please see previous progress/consult notes.   Discharge Diagnoses:  Principal Problem:   Cellulitis of right foot Active Problems:   COPD with acute exacerbation (HCC)   Acute on chronic respiratory failure with hypoxia (HCC)   COPD with chronic bronchitis (HCC)   Nicotine dependence   Chronic respiratory failure with hypoxia (HCC)   Hyponatremia   Sepsis (HCC)   HTN (hypertension)   Abnormal LFTs   Cellulitis  Cellulitis of right foot: w/ chronic leg edema. Continue w/ wound care. As per podiatry, no surgical debridement. Completed abx course   Sepsis: met criteria w/ leukocytosis, hypotension, tachypnea & cellulitis of right foot. Resolved  Acute on chronic hypoxic respiratory failure: continues to improve. Secondary to COVID19 pneumonia. Completed steroid & remdesivir course. Encourage incentive spirometry and continue on bronchodilators   COVID19 pneumonia: management as above   Thrombocytosis: etiology unclear, likely reactive. Will continue to monitor   Hyperkalemia: resolved  Smoking: smoking cessation counseling   Hyponatremia:labile   Transaminitis: labile, secondary to COVID19  Generalized weakness: PT/OT recs SNF. Will go to [facility] tomorrow   Discharge Instructions  Discharge Instructions    Diet - low sodium heart healthy   Complete by: As directed    Discharge instructions   Complete by: As directed    F/u w/ PCP in 1-2 weeks. F/u w/ podiatry, Dr. Vickki Muff in 2 weeks   Discharge wound care:   Complete by: As directed    Reason for Consult: Previously ordered wound dressing (calcium alginate) to right foot is adhering to wound bed.  Podiatry (Dr. Vickki Muff) ordered Ca+ alginate on 05/15/20. Patient is for transfer today to skilled facility. Wound type: full thickness, infectious Pressure Injury POA: N/A Measurement: To be obtained by Bedside RN and documented on Nursing Flow Sheet Wound bed:red, dry Drainage (amount, consistency, odor)  small amount serous Periwound: intact, dry Dressing procedure/placement/frequency: Dr. Vickki Muff (Podiatric Medicine) saw patient on 05/15/20 and provided guidance for topical care using a Ca+ alginate. WOC contacted because dressing had sufficiently dried the tissue that was too moist and was now adhering to the wound and creating a traumatic wound change.  I will change the topical care order to an antimicrobial nonadherent (xeroform) until Dr. Vickki Muff can see the patient in the outpatient setting. Patient is for transfer today to a skilled facility.   Increase activity slowly   Complete by: As directed      Allergies as of 05/25/2020      Reactions   Penicillins Anaphylaxis, Swelling, Other (See Comments)   05/2020 Reaction happened in his childhood and his mother told him that he "swelled up." Tolerated meropenem x2. Had a PCN reaction causing immediate rash, facial/tongue/throat swelling, SOB or lightheadedness with hypotension: Yes Had a PCN reaction causing severe rash involving mucus membranes or skin necrosis: No Had a PCN reaction that required hospitalization No Had a PCN reaction occurring within the last 10 years: No If all the above answers are "NO", may proceed with Cephalosporin use.      Medication List    TAKE these medications   acetaminophen 325 MG tablet Commonly known as: TYLENOL Take 2 tablets (650 mg total) by mouth every 6 (six) hours as needed for mild pain (or Fever >/= 101).   albuterol 108 (90 Base) MCG/ACT inhaler Commonly known as: VENTOLIN HFA Inhale into the lungs every 6 (six) hours as needed for wheezing or shortness of breath.   atorvastatin 40 MG tablet Commonly known as: LIPITOR Take 40 mg by mouth daily.   Breo Ellipta 100-25 MCG/INH Aepb Generic drug: fluticasone furoate-vilanterol Take 1 puff by mouth daily.   diltiazem 120 MG tablet Commonly known as: Cardizem Take 1 tablet (120 mg total) by mouth 4 (four) times daily.   hydrochlorothiazide  25 MG tablet Commonly known as: HYDRODIURIL Take 25 mg by mouth daily.   ipratropium-albuterol 0.5-2.5 (3) MG/3ML Soln Commonly known as: DUONEB Take 3 mLs by nebulization every 4 (four) hours.   lisinopril 10 MG tablet Commonly known as: ZESTRIL Take 10 mg by mouth daily.   montelukast 10 MG tablet Commonly known as: SINGULAIR Take 10 mg by mouth at bedtime.   OXYGEN Inhale 2 L into the lungs at bedtime. And as needed   pantoprazole 20 MG tablet Commonly known as: PROTONIX Take 20 mg by mouth daily.   tiotropium 18 MCG inhalation capsule Commonly known as: SPIRIVA Place 18 mcg into inhaler and inhale daily.            Discharge Care Instructions  (From admission, onward)         Start     Ordered   05/25/20 0000  Discharge wound care:       Comments: Reason for Consult: Previously ordered wound dressing (calcium alginate) to right foot is adhering to wound bed. Podiatry (Dr. Vickki Muff) ordered Ca+ alginate on 05/15/20. Patient is for transfer today to skilled facility. Wound type: full thickness, infectious Pressure Injury POA: N/A Measurement: To be obtained by Bedside RN and documented on Nursing Flow Sheet Wound bed:red, dry Drainage (amount, consistency,  odor) small amount serous Periwound: intact, dry Dressing procedure/placement/frequency: Dr. Vickki Muff (Podiatric Medicine) saw patient on 05/15/20 and provided guidance for topical care using a Ca+ alginate. WOC contacted because dressing had sufficiently dried the tissue that was too moist and was now adhering to the wound and creating a traumatic wound change.  I will change the topical care order to an antimicrobial nonadherent (xeroform) until Dr. Vickki Muff can see the patient in the outpatient setting. Patient is for transfer today to a skilled facility.   05/25/20 1125          Contact information for follow-up providers    Samara Deist, DPM Follow up in 2 week(s).   Specialty: Podiatry Contact  information: [place] [address] [phone]        Revelo, Elyse Jarvis, MD Follow up in 1 week(s).   Specialty: Family Medicine Contact information: [place] [address] [phone]            Contact information for after-discharge care    Destination    [facility] Preferred SNF .   Service: Skilled Nursing Contact information: [place] [phone]                 Allergies  Allergen Reactions  . Penicillins Anaphylaxis, Swelling and Other (See Comments)    05/2020 Reaction happened in his childhood and his mother told him that he "swelled up." Tolerated meropenem x2.  Had a PCN reaction causing immediate rash, facial/tongue/throat swelling, SOB or lightheadedness with hypotension: Yes Had a PCN reaction causing severe rash involving mucus membranes or skin necrosis: No Had a PCN reaction that required hospitalization No Had a PCN reaction occurring within the last 10 years: No If all the above answers are "NO", may proceed with Cephalosporin use.    Consultations:  Podiatry    Procedures/Studies: DG Chest 2 View  Result Date: 05/14/2020 CLINICAL DATA:  Cough and shortness of breath EXAM: CHEST - 2 VIEW COMPARISON:  6/10/8 FINDINGS: Cardiac shadow is within normal limits. Aortic calcifications are again identified. Diffuse interstitial scarring and fibrosis is noted stable from the prior exam. Lungs are again hyperinflated. No acute bony abnormality is seen. IMPRESSION: COPD and chronic fibrotic changes.  No acute abnormality noted. Electronically Signed   By: Inez Catalina M.D.   On: 05/14/2020 16:43   MR FOOT RIGHT WO CONTRAST  Result Date: 05/16/2020 CLINICAL DATA:  Right foot swelling and redness. EXAM: MRI OF THE RIGHT FOREFOOT WITHOUT CONTRAST TECHNIQUE: Multiplanar, multisequence MR imaging of the right foot was performed. No intravenous  contrast was administered. COMPARISON:  Right foot x-rays dated May 14, 2020. FINDINGS: Bones/Joint/Cartilage No marrow signal abnormality. No acute fracture or dislocation. Old healed fracture of the base of the fifth metatarsal. Hallux valgus deformity. Mild first MTP joint osteoarthritis with small joint effusion. Mild midfoot osteoarthritis. Ligaments Collateral ligaments are intact. Muscles and Tendons Flexor and extensor tendons are intact. No tenosynovitis. Diffuse fatty atrophy of the intrinsic foot muscles. Soft tissue Diffuse soft tissue swelling. No fluid collection or hematoma. No soft tissue mass. IMPRESSION: 1. Diffuse soft tissue swelling, likely reflecting cellulitis. No abscess or osteomyelitis. Electronically Signed   By: Titus Dubin M.D.   On: 05/16/2020 09:21   DG Foot Complete Right  Result Date: 05/14/2020 CLINICAL DATA:  Concern for possible osteomyelitis. Bilateral foot swelling and redness. The top of the right foot is black. Ulcer to the bottom  of the heel. EXAM: RIGHT FOOT COMPLETE - 3+ VIEW COMPARISON:  None. FINDINGS: There is a hallux valgus deformity. No soft tissue gas identified. No bony erosion to suggest osteomyelitis. Deformity of the fifth metatarsal base is likely sequela of previous trauma. IMPRESSION: 1. Deformity of the fifth metatarsal base is likely due to previous trauma. No evidence of osteomyelitis on this study. MRI would be more sensitive for osteomyelitis. Electronically Signed   By: Dorise Bullion III M.D   On: 05/14/2020 20:04      Subjective: Pt c/o generalized weakness   Discharge Exam: Vitals:   05/25/20 0351 05/25/20 0718  BP: 124/70 116/66  Pulse: 84 82  Resp: 18 16  Temp: 98.5 F (36.9 C) 98.2 F (36.8 C)  SpO2: 98% 100%   Vitals:   05/24/20 0838 05/24/20 2001 05/25/20 0351 05/25/20 0718  BP: 128/66 123/62 124/70 116/66  Pulse:  92 84 82  Resp:  20 18 16   Temp: 97.7 F (36.5 C) 98.3 F (36.8 C) 98.5 F (36.9 C) 98.2 F  (36.8 C)  TempSrc: Oral Oral Oral   SpO2: 95% 97% 98% 100%  Weight:      Height:        General: Pt is alert, awake, not in acute distress. Disheveled  Cardiovascular:S1/S2 +, no rubs, no gallops Respiratory: decreased breath sounds b/l otherwise clear  Abdominal: Soft, NT, ND, bowel sounds + Extremities:  no cyanosis    The results of significant diagnostics from this hospitalization (including imaging, microbiology, ancillary and laboratory) are listed below for reference.     Microbiology: No results found for this or any previous visit (from the past 240 hour(s)).   Labs: BNP (last 3 results) Recent Labs    05/14/20 1610  BNP 54.5   Basic Metabolic Panel: Recent Labs  Lab 05/21/20 0416 05/22/20 0528 05/23/20 0514 05/24/20 0717 05/25/20 0413  NA 133* 133* 133* 132* 134*  K 4.8 5.3* 4.8 4.5 4.4  CL 93* 91* 93* 91* 96*  CO2 35* 36* 35* 31 31  GLUCOSE 84 102* 78 110* 87  BUN 18 18 14 19 18   CREATININE 0.45* 0.51* 0.42* 0.51* 0.51*  CALCIUM 8.2* 8.4* 8.7* 8.8* 8.6*   Liver Function Tests: Recent Labs  Lab 05/19/20 0557 05/20/20 0549 05/21/20 0416 05/22/20 0528 05/23/20 0514  AST 60* 46* 34 31 37  ALT 65* 59* 52* 47* 51*  ALKPHOS 145* 151* 127* 145* 138*  BILITOT 0.7 0.6 0.8 0.8 0.5  PROT 5.6* 5.4* 5.3* 5.6* 5.6*  ALBUMIN 2.5* 2.5* 2.4* 2.7* 2.6*   No results for input(s): LIPASE, AMYLASE in the last 168 hours. No results for input(s): AMMONIA in the last 168 hours. CBC: Recent Labs  Lab 05/21/20 0416 05/22/20 0528 05/23/20 0514 05/24/20 0717 05/25/20 0413  WBC 10.4 10.4 11.2* 11.2* 10.1  HGB 12.3* 11.5* 12.0* 10.9* 11.0*  HCT 37.1* 35.9* 36.9* 33.3* 33.3*  MCV 89.0 90.4 90.2 89.8 89.5  PLT 315 335 405* 419* 432*   Cardiac Enzymes: No results for input(s): CKTOTAL, CKMB, CKMBINDEX, TROPONINI in the last 168 hours. BNP: Invalid input(s): POCBNP CBG: No results for input(s): GLUCAP in the last 168 hours. D-Dimer No results for input(s):  DDIMER in the last 72 hours. Hgb A1c No results for input(s): HGBA1C in the last 72 hours. Lipid Profile No results for input(s): CHOL, HDL, LDLCALC, TRIG, CHOLHDL, LDLDIRECT in the last 72 hours. Thyroid function studies No results for input(s): TSH, T4TOTAL, T3FREE, THYROIDAB in the last  72 hours.  Invalid input(s): FREET3 Anemia work up Recent Labs    05/24/20 0717 05/25/20 0413  FERRITIN 140 134   Urinalysis    Component Value Date/Time   COLORURINE YELLOW (A) 07/28/2015 2122   APPEARANCEUR CLEAR (A) 07/28/2015 2122   APPEARANCEUR Clear 09/12/2013 0920   LABSPEC 1.011 07/28/2015 2122   LABSPEC 1.010 09/12/2013 0920   PHURINE 5.0 07/28/2015 2122   GLUCOSEU 50 (A) 07/28/2015 2122   GLUCOSEU Negative 09/12/2013 0920   HGBUR 1+ (A) 07/28/2015 2122   BILIRUBINUR NEGATIVE 07/28/2015 2122   BILIRUBINUR Negative 09/12/2013 0920   KETONESUR TRACE (A) 07/28/2015 2122   PROTEINUR NEGATIVE 07/28/2015 2122   NITRITE NEGATIVE 07/28/2015 2122   LEUKOCYTESUR NEGATIVE 07/28/2015 2122   LEUKOCYTESUR Negative 09/12/2013 0920   Sepsis Labs Invalid input(s): PROCALCITONIN,  WBC,  LACTICIDVEN Microbiology No results found for this or any previous visit (from the past 240 hour(s)).   Time coordinating discharge: Over 30 minutes  SIGNED:   Wyvonnia Dusky, MD  [facility] 05/25/2020, 11:34 AM Pager   If 7PM-7AM, please contact night-coverage

## 2020-05-25 NOTE — Progress Notes (Signed)
Report called to [facility].  Spoke with Therapist, sports. All question addressed at time of call.

## 2020-06-09 ENCOUNTER — Inpatient Hospital Stay
Admission: EM | Admit: 2020-06-09 | Discharge: 2020-06-17 | DRG: 871 | Disposition: A | Discharge status: Home Health | Payer: Medicare HMO | Attending: Internal Medicine | Admitting: Internal Medicine

## 2020-06-09 ENCOUNTER — Other Ambulatory Visit: Payer: Self-pay

## 2020-06-09 ENCOUNTER — Emergency Department: Payer: Medicare HMO

## 2020-06-09 DIAGNOSIS — Z79899 Other long term (current) drug therapy: Secondary | ICD-10-CM

## 2020-06-09 DIAGNOSIS — Z9981 Dependence on supplemental oxygen: Secondary | ICD-10-CM | POA: Diagnosis not present

## 2020-06-09 DIAGNOSIS — J440 Chronic obstructive pulmonary disease with acute lower respiratory infection: Secondary | ICD-10-CM | POA: Diagnosis present

## 2020-06-09 DIAGNOSIS — Z8249 Family history of ischemic heart disease and other diseases of the circulatory system: Secondary | ICD-10-CM | POA: Diagnosis not present

## 2020-06-09 DIAGNOSIS — E785 Hyperlipidemia, unspecified: Secondary | ICD-10-CM | POA: Diagnosis present

## 2020-06-09 DIAGNOSIS — Z7951 Long term (current) use of inhaled steroids: Secondary | ICD-10-CM

## 2020-06-09 DIAGNOSIS — E222 Syndrome of inappropriate secretion of antidiuretic hormone: Secondary | ICD-10-CM | POA: Diagnosis present

## 2020-06-09 DIAGNOSIS — A4101 Sepsis due to Methicillin susceptible Staphylococcus aureus: Principal | ICD-10-CM | POA: Diagnosis present

## 2020-06-09 DIAGNOSIS — Z8616 Personal history of COVID-19: Secondary | ICD-10-CM | POA: Diagnosis not present

## 2020-06-09 DIAGNOSIS — K219 Gastro-esophageal reflux disease without esophagitis: Secondary | ICD-10-CM | POA: Diagnosis present

## 2020-06-09 DIAGNOSIS — E871 Hypo-osmolality and hyponatremia: Secondary | ICD-10-CM

## 2020-06-09 DIAGNOSIS — J9621 Acute and chronic respiratory failure with hypoxia: Secondary | ICD-10-CM | POA: Diagnosis present

## 2020-06-09 DIAGNOSIS — I5033 Acute on chronic diastolic (congestive) heart failure: Secondary | ICD-10-CM | POA: Diagnosis present

## 2020-06-09 DIAGNOSIS — A419 Sepsis, unspecified organism: Secondary | ICD-10-CM | POA: Diagnosis present

## 2020-06-09 DIAGNOSIS — Z880 Allergy status to penicillin: Secondary | ICD-10-CM

## 2020-06-09 DIAGNOSIS — R6520 Severe sepsis without septic shock: Secondary | ICD-10-CM | POA: Diagnosis present

## 2020-06-09 DIAGNOSIS — J449 Chronic obstructive pulmonary disease, unspecified: Secondary | ICD-10-CM | POA: Diagnosis not present

## 2020-06-09 DIAGNOSIS — J15211 Pneumonia due to Methicillin susceptible Staphylococcus aureus: Secondary | ICD-10-CM | POA: Diagnosis present

## 2020-06-09 DIAGNOSIS — Z20822 Contact with and (suspected) exposure to covid-19: Secondary | ICD-10-CM | POA: Diagnosis present

## 2020-06-09 DIAGNOSIS — F17210 Nicotine dependence, cigarettes, uncomplicated: Secondary | ICD-10-CM | POA: Diagnosis present

## 2020-06-09 DIAGNOSIS — L03115 Cellulitis of right lower limb: Secondary | ICD-10-CM | POA: Diagnosis present

## 2020-06-09 DIAGNOSIS — I110 Hypertensive heart disease with heart failure: Secondary | ICD-10-CM | POA: Diagnosis present

## 2020-06-09 DIAGNOSIS — I959 Hypotension, unspecified: Secondary | ICD-10-CM | POA: Diagnosis present

## 2020-06-09 DIAGNOSIS — J189 Pneumonia, unspecified organism: Secondary | ICD-10-CM | POA: Diagnosis not present

## 2020-06-09 DIAGNOSIS — E860 Dehydration: Secondary | ICD-10-CM | POA: Diagnosis present

## 2020-06-09 DIAGNOSIS — Z800 Family history of malignant neoplasm of digestive organs: Secondary | ICD-10-CM | POA: Diagnosis not present

## 2020-06-09 DIAGNOSIS — F17200 Nicotine dependence, unspecified, uncomplicated: Secondary | ICD-10-CM | POA: Diagnosis not present

## 2020-06-09 DIAGNOSIS — I248 Other forms of acute ischemic heart disease: Secondary | ICD-10-CM | POA: Diagnosis present

## 2020-06-09 DIAGNOSIS — R0602 Shortness of breath: Secondary | ICD-10-CM | POA: Diagnosis present

## 2020-06-09 DIAGNOSIS — J441 Chronic obstructive pulmonary disease with (acute) exacerbation: Secondary | ICD-10-CM | POA: Diagnosis present

## 2020-06-09 DIAGNOSIS — I10 Essential (primary) hypertension: Secondary | ICD-10-CM

## 2020-06-09 LAB — COMPREHENSIVE METABOLIC PANEL
ALT: 21 U/L (ref 0–44)
AST: 24 U/L (ref 15–41)
Albumin: 3.2 g/dL — ABNORMAL LOW (ref 3.5–5.0)
Alkaline Phosphatase: 122 U/L (ref 38–126)
Anion gap: 11 (ref 5–15)
BUN: 20 mg/dL (ref 8–23)
CO2: 24 mmol/L (ref 22–32)
Calcium: 8.6 mg/dL — ABNORMAL LOW (ref 8.9–10.3)
Chloride: 88 mmol/L — ABNORMAL LOW (ref 98–111)
Creatinine, Ser: 0.88 mg/dL (ref 0.61–1.24)
GFR, Estimated: >60 mL/min (ref >60)
Glucose, Bld: 109 mg/dL — ABNORMAL HIGH (ref 70–99)
Potassium: 4.2 mmol/L (ref 3.5–5.1)
Sodium: 123 mmol/L — ABNORMAL LOW (ref 135–145)
Total Bilirubin: 1.1 mg/dL (ref 0.3–1.2)
Total Protein: 6.7 g/dL (ref 6.5–8.1)

## 2020-06-09 LAB — BLOOD GAS, VENOUS
Acid-base deficit: 1.2 mmol/L (ref 0.0–2.0)
Bicarbonate: 23.6 mmol/L (ref 20.0–28.0)
O2 Saturation: 71.5 %
Patient temperature: 37
pCO2, Ven: 39 mmHg — ABNORMAL LOW (ref 44.0–60.0)
pH, Ven: 7.39 (ref 7.250–7.430)
pO2, Ven: 38 mmHg (ref 32.0–45.0)

## 2020-06-09 LAB — LACTIC ACID, PLASMA: Lactic Acid, Venous: 1.4 mmol/L (ref 0.5–1.9)

## 2020-06-09 LAB — CBC
HCT: 30.5 % — ABNORMAL LOW (ref 39.0–52.0)
Hemoglobin: 10.4 g/dL — ABNORMAL LOW (ref 13.0–17.0)
MCH: 29.5 pg (ref 26.0–34.0)
MCHC: 34.1 g/dL (ref 30.0–36.0)
MCV: 86.6 fL (ref 80.0–100.0)
Platelets: 369 10*3/uL (ref 150–400)
RBC: 3.52 MIL/uL — ABNORMAL LOW (ref 4.22–5.81)
RDW: 15 % (ref 11.5–15.5)
WBC: 31.1 10*3/uL — ABNORMAL HIGH (ref 4.0–10.5)
nRBC: 0 % (ref 0.0–0.2)

## 2020-06-09 LAB — SARS CORONAVIRUS 2 BY RT PCR (HOSPITAL ORDER, PERFORMED IN ~~LOC~~ HOSPITAL LAB): SARS Coronavirus 2: NEGATIVE

## 2020-06-09 LAB — TROPONIN I (HIGH SENSITIVITY): Troponin I (High Sensitivity): 22 ng/L — ABNORMAL HIGH (ref <18)

## 2020-06-09 MED ORDER — SODIUM CHLORIDE 0.9 % IV BOLUS (SEPSIS)
1000.0000 mL | Freq: Once | INTRAVENOUS | Status: AC
  Administered 2020-06-09: 1000 mL via INTRAVENOUS

## 2020-06-09 MED ORDER — LEVOFLOXACIN IN D5W 750 MG/150ML IV SOLN
750.0000 mg | Freq: Once | INTRAVENOUS | Status: AC
  Administered 2020-06-09: 750 mg via INTRAVENOUS
  Filled 2020-06-09: qty 150

## 2020-06-09 MED ORDER — IPRATROPIUM-ALBUTEROL 0.5-2.5 (3) MG/3ML IN SOLN
3.0000 mL | Freq: Once | RESPIRATORY_TRACT | Status: AC
  Administered 2020-06-09: 3 mL via RESPIRATORY_TRACT
  Filled 2020-06-09: qty 3

## 2020-06-09 MED ORDER — METHYLPREDNISOLONE SODIUM SUCC 125 MG IJ SOLR
125.0000 mg | Freq: Once | INTRAMUSCULAR | Status: AC
  Administered 2020-06-09: 125 mg via INTRAVENOUS
  Filled 2020-06-09: qty 2

## 2020-06-09 MED ORDER — SODIUM CHLORIDE 0.9 % IV SOLN
500.0000 mg | INTRAVENOUS | Status: DC
  Administered 2020-06-10 – 2020-06-12 (×3): 500 mg via INTRAVENOUS
  Filled 2020-06-09 (×5): qty 500

## 2020-06-09 MED ORDER — VANCOMYCIN HCL 1250 MG/250ML IV SOLN
1250.0000 mg | Freq: Two times a day (BID) | INTRAVENOUS | Status: DC
  Administered 2020-06-10 – 2020-06-11 (×3): 1250 mg via INTRAVENOUS
  Filled 2020-06-09 (×5): qty 250

## 2020-06-09 MED ORDER — SODIUM CHLORIDE 0.9 % IV SOLN
2.0000 g | Freq: Three times a day (TID) | INTRAVENOUS | Status: DC
  Administered 2020-06-10 – 2020-06-11 (×5): 2 g via INTRAVENOUS
  Filled 2020-06-09 (×9): qty 2

## 2020-06-09 MED ORDER — VANCOMYCIN HCL 2000 MG/400ML IV SOLN
2000.0000 mg | Freq: Once | INTRAVENOUS | Status: AC
  Administered 2020-06-10: 2000 mg via INTRAVENOUS
  Filled 2020-06-09: qty 400

## 2020-06-09 MED ORDER — VANCOMYCIN HCL IN DEXTROSE 1-5 GM/200ML-% IV SOLN
1000.0000 mg | Freq: Once | INTRAVENOUS | Status: DC
  Filled 2020-06-09: qty 200

## 2020-06-09 NOTE — ED Triage Notes (Signed)
Pt presents to the [facility] via EMS from [facility] with c/o shortness of breath. EMS states that pt uses 2LNC at all times and was found by staff with oxygen saturation at 80%.

## 2020-06-09 NOTE — H&P (Signed)
Matthias Bogus UXN:[id number] DOB: 10-06-1950 DOA: 06/09/2020     PCP: System, Provider Not In   Outpatient Specialists:     Pulmonary  Dr. Erby Pian, MD     Patient arrived to ER on 06/09/20 at 2047 Referred by Attending Harvest Dark, MD  Patient coming from:   From [facility]  Chief Complaint:    Chief Complaint  Patient presents with  . Shortness of Breath    HPI: Jeremiah [name] is a 70 y.o. male with medical history significant of COPD on home oxygen at 2 L with, HTN, tobacco abuse, hyponatremia, GERD, history of recent Covid infection    Presented with cough and shortness of breath For the past 2 to 3 days have been having cough productive of thick sputum Noted to have elevated white blood cell count and worsening hypoxia requiring up to 6 L to maintain oxygen saturation 91% Patient tested Covid positive about 1 month ago Last month was admitted with sepsis secondary to right foot cellulitis Was treated with IV vancomycin and meropenem  Patient was found to be Covid positive on 7 January. Found to have Covid pneumonia treated for remdesivir steroids bronchodilators and completed his treatment while hospitalized He was discharged to SNF.  Infectious risk factors:  Reports  shortness of breath, dry cough,      Has been vaccinated against COVID j&J not boosted    Initial COVID TEST  NEGATIVE   Lab Results  Component Value Date   SARSCOV2NAA NEGATIVE 06/09/2020   SARSCOV2NAA POSITIVE (A) 05/14/2020   [place] NEGATIVE 01/23/2020   [place] NEGATIVE 12/05/2019     Regarding pertinent Chronic problems    Hyperlipidemia -  on statins LIPITOR Lipid Panel     HTN on cardizem, HCTZ, lisinopril                      COPD -  on baseline oxygen  2L,        Chronic anemia - baseline hg Hemoglobin & Hematocrit  Recent Labs    05/24/20 0717 05/25/20 0413 06/09/20 2110  HGB 10.9* 11.0* 10.4*    While in ER: Felt  most likely to have COPD exacerbation started on Solu-Medrol duo nebs Chest x-ray shows multifocal pneumonia Covid negative  Patient meeting sepsis criteria Started on IV Antibiotics Levaquin and vancomycin No history of hypotension while in ER Lactic acid below 2 Slightly elevated troponin at 22  Hospitalist was called for admission for sepsis secondary pneumonia acute on chronic respiratory failure with hypoxia  The following Work up has been ordered so far:  Orders Placed This Encounter  Procedures  . SARS Coronavirus 2 by RT PCR (hospital order, performed in [facility] hospital lab) Nasopharyngeal Nasopharyngeal Swab  . Blood culture (routine x 2)  . DG Chest Portable 1 View  . CBC  . Comprehensive metabolic panel  . Lactic acid, plasma  . DO NOT delay antibiotics if unable to obtain blood culture.  . Code Sepsis activation.  This occurs automatically when order is signed and prioritizes pharmacy, lab, and radiology services for STAT collections and interventions.  If [facility] downtime, call [facility] [phone]) to activate Code Sepsis.  Marland Kitchen pharmacy consult  . levofloxacin [facility]) per pharmacy consult  . Consult to hospitalist  ALL PATIENTS BEING ADMITTED/HAVING PROCEDURES NEED COVID-19 SCREENING  . EKG 12-Lead  . Insert 2nd peripheral IV if not already present.     Following Medications were ordered in ER:  Medications  sodium chloride 0.9 % bolus 1,000 mL (1,000 mLs Intravenous New Bag/Given 06/09/20 2205)  levofloxacin (LEVAQUIN) IVPB 750 mg (750 mg Intravenous New Bag/Given 06/09/20 2204)  vancomycin (VANCOCIN) IVPB 1000 mg/200 mL premix (has no administration in time range)  methylPREDNISolone sodium succinate (SOLU-MEDROL) 125 mg/2 mL injection 125 mg (125 mg Intravenous Given 06/09/20 2114)  ipratropium-albuterol (DUONEB) 0.5-2.5 (3) MG/3ML nebulizer solution 3 mL (3 mLs Nebulization Given 06/09/20 2115)  ipratropium-albuterol (DUONEB) 0.5-2.5 (3) MG/3ML nebulizer solution 3  mL (3 mLs Nebulization Given 06/09/20 2115)        Consult Orders  (From admission, onward)         Start     Ordered   06/09/20 2217  Consult to hospitalist  ALL PATIENTS BEING ADMITTED/HAVING PROCEDURES NEED COVID-19 SCREENING  Once       Comments: ALL PATIENTS BEING ADMITTED/HAVING PROCEDURES NEED COVID-19 SCREENING  Provider:  (Not yet assigned)  Question Answer Comment  Place call to: [facility]   Reason for Consult Admit   Diagnosis/Clinical Info for Consult: PNA      06/09/20 2216          Significant initial  Findings: Abnormal Labs Reviewed  CBC - Abnormal; Notable for the following components:      Result Value   WBC 31.1 (*)    RBC 3.52 (*)    Hemoglobin 10.4 (*)    HCT 30.5 (*)    All other components within normal limits  COMPREHENSIVE METABOLIC PANEL - Abnormal; Notable for the following components:   Sodium 123 (*)    Chloride 88 (*)    Glucose, Bld 109 (*)    Calcium 8.6 (*)    Albumin 3.2 (*)    All other components within normal limits  TROPONIN I (HIGH SENSITIVITY) - Abnormal; Notable for the following components:   Troponin I (High Sensitivity) 22 (*)    All other components within normal limits    Otherwise labs showing:    Recent Labs  Lab 06/09/20 2110  NA 123*  K 4.2  CO2 24  GLUCOSE 109*  BUN 20  CREATININE 0.88  CALCIUM 8.6*    Cr  Up from baseline see below Lab Results  Component Value Date   CREATININE 0.88 06/09/2020   CREATININE 0.51 (L) 05/25/2020   CREATININE 0.51 (L) 05/24/2020    Recent Labs  Lab 06/09/20 2110  AST 24  ALT 21  ALKPHOS 122  BILITOT 1.1  PROT 6.7  ALBUMIN 3.2*   Lab Results  Component Value Date   CALCIUM 8.6 (L) 06/09/2020     WBC      Component Value Date/Time   WBC 31.1 (H) 06/09/2020 2110   LYMPHSABS 0.7 05/14/2020 2243   LYMPHSABS 0.2 (L) 10/01/2013 0134   MONOABS 1.2 (H) 05/14/2020 2243   MONOABS 0.0 (L) 10/01/2013 0134   EOSABS 0.0 05/14/2020 2243   EOSABS 0.0 10/01/2013 0134    BASOSABS 0.0 05/14/2020 2243   BASOSABS 0.0 10/01/2013 0134    Plt: Lab Results  Component Value Date   PLT 369 06/09/2020    Lactic Acid, Venous    Component Value Date/Time   LATICACIDVEN 1.4 06/09/2020 2156    Procalcitonin   Ordered    Venous  Blood Gas result:  PH7.39  PCO2 39 ;    ABG    Component Value Date/Time   HCO3 23.6 06/09/2020 2326   ACIDBASEDEF 1.2 06/09/2020 2326   O2SAT 71.5 06/09/2020 2326   HG/HCT  stable,       Component Value Date/Time   HGB 10.4 (L) 06/09/2020 2110   HGB 13.6 10/01/2013 0134   HCT 30.5 (L) 06/09/2020 2110   HCT 40.7 10/01/2013 0134   MCV 86.6 06/09/2020 2110   MCV 91 10/01/2013 0134      Troponin 22  Cardiac Panel (last 3 results) No results for input(s): CKTOTAL, CKMB, TROPONINI, RELINDX in the last 72 hours.     ECG: Ordered Personally reviewed by me showing: HR : 88 Rhythm:  NSR,    no evidence of ischemic changes QTC 447    BNP (last 3 results) Recent Labs    05/14/20 1610  BNP 30.6       UA  ordered       Ordered  CXR - multifocal pneumonia     ED Triage Vitals  Enc Vitals Group     BP 06/09/20 2100 (!) 107/54     Pulse Rate 06/09/20 2100 90     Resp 06/09/20 2100 (!) 26     Temp 06/09/20 2100 98.1 F (36.7 C)     Temp Source 06/09/20 2100 Oral     SpO2 06/09/20 2100 (!) 80 %     Weight 06/09/20 2101 193 lb (87.5 kg)     Height 06/09/20 2101 6' 1"  (1.854 m)     Head Circumference --      [place] Flow --      Pain Score 06/09/20 2101 0     Pain Loc --      Pain Edu? --      Excl. in [place]? --   TMAX(24)@       Latest  Blood pressure (!) 111/57, pulse 87, temperature 98.1 F (36.7 C), temperature source Oral, resp. rate (!) 31, height 6' 1"  (1.854 m), weight 87.5 kg, SpO2 94 %.    Review of Systems:    Pertinent positives include: fatigue,   shortness of breath at rest.   dyspnea on exertion productive cough, Constitutional:  No weight loss, night sweats, Fevers, chillsweight loss  HEENT:   No headaches, Difficulty swallowing,Tooth/dental problems,Sore throat,  No sneezing, itching, ear ache, nasal congestion, post nasal drip,  Cardio-vascular:  No chest pain, Orthopnea, PND, anasarca, dizziness, palpitations.no Bilateral lower extremity swelling  GI:  No heartburn, indigestion, abdominal pain, nausea, vomiting, diarrhea, change in bowel habits, loss of appetite, melena, blood in stool, hematemesis Resp:  no, No excess mucus, no  No non-productive cough, No coughing up of blood.No change in color of mucus.No wheezing. Skin:  no rash or lesions. No jaundice GU:  no dysuria, change in color of urine, no urgency or frequency. No straining to urinate.  No flank pain.  Musculoskeletal:  No joint pain or no joint swelling. No decreased range of motion. No back pain.  Psych:  No change in mood or affect. No depression or anxiety. No memory loss.  Neuro: no localizing neurological complaints, no tingling, no weakness, no double vision, no gait abnormality, no slurred speech, no confusion  All systems reviewed and apart from [employer] all are negative  Past Medical History:   Past Medical History:  Diagnosis Date  . Asthma   . COPD (chronic obstructive pulmonary disease) (HCC)    O2 dependent - 2L  . GERD (gastroesophageal reflux disease)   . HTN (hypertension)   . Oxygen dependent   . Tobacco dependence      Past Surgical History:  Procedure Laterality Date  . CATARACT EXTRACTION W/PHACO  Left 12/09/2019   Procedure: CATARACT EXTRACTION PHACO AND INTRAOCULAR LENS PLACEMENT (IOC) COMPLICATED LEFT 51.70 01:74.9;  Surgeon: Birder Robson, MD;  Location: [facility];  Service: Ophthalmology;  Laterality: Left;  [place]  . CATARACT EXTRACTION W/PHACO Right 01/27/2020   Procedure: CATARACT EXTRACTION PHACO AND INTRAOCULAR LENS PLACEMENT ([place]) RIGHT VISION BLUE 26.83 02:07.6;  Surgeon: Birder Robson, MD;  Location: [facility];  Service: Ophthalmology;   Laterality: Right;  . HERNIA REPAIR    . NECK SURGERY      Social History:  Ambulatory walker      reports that he has been smoking cigarettes. He has a 25.00 pack-year smoking history. He has never used smokeless tobacco. He reports current alcohol use. He reports that he does not use drugs.   Family History:   Family History  Problem Relation Age of Onset  . Cancer - Colon Father   . Congestive Heart Failure Mother     Allergies: Allergies  Allergen Reactions  . Penicillins Anaphylaxis, Swelling and Other (See Comments)    05/2020 Reaction happened in his childhood and his mother told him that he "swelled up." Tolerated meropenem x2.  Had a PCN reaction causing immediate rash, facial/tongue/throat swelling, SOB or lightheadedness with hypotension: Yes Had a PCN reaction causing severe rash involving mucus membranes or skin necrosis: No Had a PCN reaction that required hospitalization No Had a PCN reaction occurring within the last 10 years: No If all the above answers are "NO", may proceed with Cephalosporin use.     Prior to Admission medications   Medication Sig Start Date End Date Taking? Authorizing Provider  acetaminophen (TYLENOL) 325 MG tablet Take 2 tablets (650 mg total) by mouth every 6 (six) hours as needed for mild pain (or Fever >/= 101). 08/06/15   Dustin Flock, MD  albuterol (VENTOLIN HFA) 108 (90 Base) MCG/ACT inhaler Inhale into the lungs every 6 (six) hours as needed for wheezing or shortness of breath.    [provider]  atorvastatin (LIPITOR) 40 MG tablet Take 40 mg by mouth daily.    [provider]  BREO ELLIPTA 100-25 MCG/INH AEPB Take 1 puff by mouth daily. 09/21/16   [provider]  diltiazem (CARDIZEM) 120 MG tablet Take 1 tablet (120 mg total) by mouth 4 (four) times daily. 08/06/15   Dustin Flock, MD  hydrochlorothiazide (HYDRODIURIL) 25 MG tablet Take 25 mg by mouth daily.    [provider]   ipratropium-albuterol (DUONEB) 0.5-2.5 (3) MG/3ML SOLN Take 3 mLs by nebulization every 4 (four) hours. 08/06/15   Dustin Flock, MD  lisinopril (PRINIVIL,ZESTRIL) 10 MG tablet Take 10 mg by mouth daily.    [provider]  montelukast (SINGULAIR) 10 MG tablet Take 10 mg by mouth at bedtime.    [provider]  OXYGEN Inhale 2 L into the lungs at bedtime. And as needed    [provider]  pantoprazole (PROTONIX) 20 MG tablet Take 20 mg by mouth daily.    [provider]  tiotropium (SPIRIVA) 18 MCG inhalation capsule Place 18 mcg into inhaler and inhale daily.    [provider]   Physical Exam: Vitals with BMI 06/09/2020 06/09/2020 06/09/2020  Height - - -  Weight - - -  BMI - - -  Systolic 449 675 -  Diastolic 57 61 -  Pulse 87 89 90     1. General:  in No  Acute distress   Chronically ill -appearing 2. Psychological:  Alert and  Oriented 3. Head/ENT:    Dry Mucous Membranes                          Head Non traumatic, neck supple                           Poor Dentition 4. SKIN:   decreased Skin turgor,  Skin clean Dry and intact no rash, right foot well healed 5. Heart: Regular rate and rhythm no  Murmur, no Rub or gallop 6. Lungs:   no wheezes some   crackles   7. Abdomen: Soft,  non-tender, Non distended  bowel sounds present 8. Lower extremities: no clubbing, cyanosis, no  edema 9. Neurologically Grossly intact, moving all 4 extremities equally   10. MSK: Normal range of motion   All other LABS:     Recent Labs  Lab 06/09/20 2110  WBC 31.1*  HGB 10.4*  HCT 30.5*  MCV 86.6  PLT 369     Recent Labs  Lab 06/09/20 2110  NA 123*  K 4.2  CL 88*  CO2 24  GLUCOSE 109*  BUN 20  CREATININE 0.88  CALCIUM 8.6*     Recent Labs  Lab 06/09/20 2110  AST 24  ALT 21  ALKPHOS 122  BILITOT 1.1  PROT 6.7  ALBUMIN 3.2*       Cultures:    Component Value Date/Time   SDES BLOOD LEFT ANTECUBITAL 05/14/2020 2008   SDES  BLOOD RIGHT ANTECUBITAL 05/14/2020 2008   SPECREQUEST  05/14/2020 2008    BOTTLES DRAWN AEROBIC AND ANAEROBIC Blood Culture results may not be optimal due to an inadequate volume of blood received in culture bottles   SPECREQUEST  05/14/2020 2008    BOTTLES DRAWN AEROBIC AND ANAEROBIC Blood Culture results may not be optimal due to an inadequate volume of blood received in culture bottles   CULT  05/14/2020 2008    NO GROWTH 5 DAYS Performed at [facility], [address], [place], [place] [address]    CULT  05/14/2020 2008    NO GROWTH 5 DAYS Performed at [facility], [address] [place], [place] [address]    REPTSTATUS 05/19/2020 FINAL 05/14/2020 2008   REPTSTATUS 05/19/2020 FINAL 05/14/2020 2008     Radiological Exams on Admission: DG Chest Portable 1 View  Result Date: 06/09/2020 CLINICAL DATA:  Shortness of breath EXAM: PORTABLE CHEST 1 VIEW COMPARISON:  May 14, 2020 FINDINGS: The heart size and mediastinal contours are mildly enlarged. Aortic knob calcifications are seen. Multifocal patchy interstitial opacities are seen throughout lower lungs, right greater than left. Biapical scarring is noted. No acute osseous abnormality. IMPRESSION: Multifocal airspace opacities, right greater than left, likely consistent with multifocal pneumonia Electronically Signed   By: Prudencio Pair M.D.   On: 06/09/2020 21:26    Chart has been reviewed   Assessment/Plan   70 y.o. male with medical history significant of COPD on home oxygen at 2 L with, HTN, tobacco abuse, hyponatremia, GERD, history of recent Covid infection   Admitted for  sepsis secondary pneumonia acute on chronic respiratory failure with hypoxia  Present on Admission:  . Sepsis ([place]) -   -SIRS criteria met with  elevated white blood cell count,       Component Value Date/Time   WBC 31.1 (H) 06/09/2020 2110   LYMPHSABS 0.7 05/14/2020 2243   LYMPHSABS 0.2 (L)  10/01/2013 0134     RR >20 Today's  Vitals   06/09/20 2115 06/09/20 2130 06/09/20 2200 06/09/20 2230  BP:  106/61 (!) 111/57 125/64  Pulse: 90 89 87 88  Resp: (!) 28 (!) 28 (!) 31 (!) 25  Temp:      TempSrc:      SpO2: 94% 95% 94% 90%  Weight:      Height:      PainSc:         -Most likely source being:  pulmonary,    Patient meeting criteria for Severe sepsis with    evidence of end organ damage/organ dysfunction such as   Acute hypoxia requiring new supplemental oxygen, SpO2: 90 % O2 Flow Rate (L/min): 6 L/min   Elevated troponin   - Obtain serial lactic acid and procalcitonin level.  - Initiated IV antibiotics Vanco streamline and azithromycin  - await results of blood and urine culture  - Rehydrate     12:06 AM  . Hyponatremia combination of pulmonary infiltrates, hydrochlorothiazide and dehydration as well as chronic hyponatremia.  Will hold hydrochlorothiazide give gentle fluids follow sodium level, obtain urine electrolytes  . HTN (hypertension) -hold home medications given hypotension  . COPD with chronic bronchitis (HCC) -albuterol as needed DuoNeb, currently no significant wheezing to suggest COPD exacerbation Significant multifocal pulmonary infiltrates  Noted  . Acute on chronic respiratory failure with hypoxia (HCC) most likely secondary to bacterial superinfection with prior history of Covid infection If continues to progress may need PCCM consult   this patient has acute respiratory failure with Hypoxia   as documented by the presence of following: O2 saturatio< 90% on RA  Likely due to:   Pneumonia,  Provide O2 therapy and titrate as needed  Continuous pulse ox   check Pulse ox with ambulation prior to discharge if able to tolerate  patient at baseline requiring 2 L  flutter valve ordered  . Tobacco dependency Other plan as per orders.  Nicotine patch  DVT prophylaxis:   Lovenox        Code Status:    Code Status: Prior FULL CODE as per patient   I had personally discussed CODE STATUS  with patient    Family Communication:   Family not at  Bedside    Disposition Plan:                              Back to current facility when stable                                                       Afebrile, white count improving able to transition to PO antibiotics                             Will need to be able to tolerate PO                            Oxygenation stable                           Will need consultants to evaluate patient prior to discharge  Would benefit from PT/OT eval prior to DC  Ordered                                               Transition of care consulted                   Consults called: None  Admission status:  ED Disposition    ED Disposition Condition [place]: [place] [100120]  Level of Care: Progressive Cardiac [106]  Admit to Progressive based on following criteria: MULTISYSTEM THREATS such as stable sepsis, metabolic/electrolyte imbalance with or without encephalopathy that is responding to early treatment.  Covid Evaluation: Confirmed COVID Negative  Diagnosis: Sepsis [facility]) [[id number]]  Admitting Physician: Toy Baker [[id number]]  Attending Physician: Toy Baker [[id number]]  Estimated length of stay: past midnight tomorrow  Certification:: I certify this patient will need inpatient services for at least 2 midnights           inpatient     I Expect 2 midnight stay secondary to severity of patient's current illness need for inpatient interventions justified by the following:  hemodynamic instability despite optimal treatment ( hypotension  tachypnea   hypoxia,  )   Severe lab/radiological/exam abnormalities including:   Multifocal pneumonia and extensive comorbidities including:   COPD/asthma    That are currently affecting medical management.   I expect  patient to be hospitalized for 2 midnights requiring inpatient medical care.  Patient is at high  risk for adverse outcome (such as loss of life or disability) if not treated.  Indication for inpatient stay as follows:    Hemodynamic instability despite maximal medical therapy,    New or worsening hypoxia  Need for IV antibiotics, IV fluids,    Level of care   Progressive tele indefinitely please discontinue once patient no longer qualifies COVID-19 Labs     Lab Results  Component Value Date   [place] 06/09/2020     Precautions: admitted as  Covid Negative     PPE: Used by the provider:   N95  eye Goggles,  Gloves      Ysabel Stankovich 06/10/2020, 12:18 AM    [facility]     after 2 AM please page floor coverage PA If 7AM-7PM, please contact the day team taking care of the patient using [email or web address]   Patient was evaluated in the context of the global COVID-19 pandemic, which necessitated consideration that the patient might be at risk for infection with the SARS-CoV-2 virus that causes COVID-19. Institutional protocols and algorithms that pertain to the evaluation of patients at risk for COVID-19 are in a state of rapid change based on information released by regulatory bodies including the CDC and federal and state organizations. These policies and algorithms were followed during the patient's care.

## 2020-06-09 NOTE — Sepsis Progress Note (Signed)
Monitoring for code sepsis protocol. 

## 2020-06-09 NOTE — ED Provider Notes (Signed)
[facility] Emergency Department Provider Note  Time seen: 8:59 PM  I have reviewed the triage vital signs and the nursing notes.   HISTORY  Chief Complaint Shortness of breath  HPI Jeremiah [name] is a 70 y.o. male with a past medical history of COPD, asthma, wears 2 L of oxygen 24/7, hypertension, gastric reflux, presents to the emergency department from [facility] facility for shortness of breath and cough.  According to the EMS report for the last 2 to 3 days patient has been coughing now with thick sputum production.  [facility] did lab work showing a elevated white blood cell count along with hypoxia so they sent the patient to the emergency department for evaluation.  Patient wears 2 L of oxygen at all times currently requiring 6 L to maintain sats around 91%.  Has an occasional cough.  Record review shows patient tested positive for Covid approximately 1 month ago.  Denies any leg pain or swelling.  Denies any chest pain.   Past Medical History:  Diagnosis Date  . Asthma   . COPD (chronic obstructive pulmonary disease) (HCC)    O2 dependent - 2L  . GERD (gastroesophageal reflux disease)   . HTN (hypertension)   . Oxygen dependent   . Tobacco dependence     Patient Active Problem List   Diagnosis Date Noted  . Cellulitis 05/15/2020  . COPD with chronic bronchitis ([place]) 05/14/2020  . Nicotine dependence 05/14/2020  . Chronic respiratory failure with hypoxia ([place]) 05/14/2020  . Cellulitis of right foot 05/14/2020  . Hyponatremia 05/14/2020  . Sepsis ([place]) 05/14/2020  . HTN (hypertension) 05/14/2020  . Abnormal LFTs 05/14/2020  . Acute on chronic respiratory failure with hypoxia ([place]) 10/15/2016  . Acute bronchitis 10/15/2016  . Tobacco dependency 10/15/2016  . COPD with acute exacerbation ([place]) 07/28/2015    Past Surgical History:  Procedure Laterality Date  . CATARACT EXTRACTION W/PHACO Left 12/09/2019   Procedure: CATARACT  EXTRACTION PHACO AND INTRAOCULAR LENS PLACEMENT (IOC) COMPLICATED LEFT 95.09 32:67.1;  Surgeon: Birder Robson, MD;  Location: [place];  Service: Ophthalmology;  Laterality: Left;  [place]  . CATARACT EXTRACTION W/PHACO Right 01/27/2020   Procedure: CATARACT EXTRACTION PHACO AND INTRAOCULAR LENS PLACEMENT ([place]) RIGHT VISION BLUE 26.83 02:07.6;  Surgeon: Birder Robson, MD;  Location: [place];  Service: Ophthalmology;  Laterality: Right;  . HERNIA REPAIR    . NECK SURGERY      Prior to Admission medications   Medication Sig Start Date End Date Taking? Authorizing Provider  acetaminophen (TYLENOL) 325 MG tablet Take 2 tablets (650 mg total) by mouth every 6 (six) hours as needed for mild pain (or Fever >/= 101). 08/06/15   Dustin Flock, MD  albuterol (VENTOLIN HFA) 108 (90 Base) MCG/ACT inhaler Inhale into the lungs every 6 (six) hours as needed for wheezing or shortness of breath.    [provider]  atorvastatin (LIPITOR) 40 MG tablet Take 40 mg by mouth daily.    [provider]  BREO ELLIPTA 100-25 MCG/INH AEPB Take 1 puff by mouth daily. 09/21/16   [provider]  diltiazem (CARDIZEM) 120 MG tablet Take 1 tablet (120 mg total) by mouth 4 (four) times daily. 08/06/15   Dustin Flock, MD  hydrochlorothiazide (HYDRODIURIL) 25 MG tablet Take 25 mg by mouth daily.    [provider]  ipratropium-albuterol (DUONEB) 0.5-2.5 (3) MG/3ML SOLN Take 3 mLs by nebulization every 4 (four) hours. 08/06/15  Dustin Flock, MD  lisinopril (PRINIVIL,ZESTRIL) 10 MG tablet Take 10 mg by mouth daily.    [provider]  montelukast (SINGULAIR) 10 MG tablet Take 10 mg by mouth at bedtime.    [provider]  OXYGEN Inhale 2 L into the lungs at bedtime. And as needed    [provider]  pantoprazole (PROTONIX) 20 MG tablet Take 20 mg by mouth daily.    [provider]  tiotropium (SPIRIVA) 18 MCG  inhalation capsule Place 18 mcg into inhaler and inhale daily.    [provider]    Allergies  Allergen Reactions  . Penicillins Anaphylaxis, Swelling and Other (See Comments)    05/2020 Reaction happened in his childhood and his mother told him that he "swelled up." Tolerated meropenem x2.  Had a PCN reaction causing immediate rash, facial/tongue/throat swelling, SOB or lightheadedness with hypotension: Yes Had a PCN reaction causing severe rash involving mucus membranes or skin necrosis: No Had a PCN reaction that required hospitalization No Had a PCN reaction occurring within the last 10 years: No If all the above answers are "NO", may proceed with Cephalosporin use.    Family History  Problem Relation Age of Onset  . Cancer - Colon Father   . Congestive Heart Failure Mother     Social History Social History   Tobacco Use  . Smoking status: Current Every Day Smoker    Packs/day: 0.50    Years: 50.00    Pack years: 25.00    Types: Cigarettes    Last attempt to quit: 07/28/2015    Years since quitting: 4.8  . Smokeless tobacco: Never Used  . Tobacco comment: started age 60.  Vaping Use  . Vaping Use: Never used  Substance Use Topics  . Alcohol use: Yes    Alcohol/week: 0.0 standard drinks    Comment: beers occasionally  . Drug use: No    Review of Systems Constitutional: Negative for fever. Cardiovascular: Negative for chest pain. Respiratory: Positive for shortness of breath.  Positive for cough with sputum production. Gastrointestinal: Negative for abdominal pain, vomiting  Musculoskeletal: Negative for musculoskeletal complaints Neurological: Negative for headache All other ROS negative  ____________________________________________   PHYSICAL EXAM:  Constitutional: Alert and oriented.  Mild distress due to difficulty breathing Eyes: Normal exam ENT      Head: Normocephalic and atraumatic.      Mouth/Throat: Mucous membranes are  moist. Cardiovascular: Normal rate, regular rhythm. Respiratory: Moderate tachypnea with decreased breath sounds bilaterally.  No obvious wheeze rales or rhonchi but diminished breath sounds. Gastrointestinal: Soft and nontender. No distention.   Musculoskeletal: Nontender with normal range of motion in all extremities. No lower extremity tenderness or edema. Neurologic:  Normal speech and language. No gross focal neurologic deficits Skin:  Skin is warm, dry and intact.  Psychiatric: Mood and affect are normal.   ____________________________________________    EKG  EKG viewed and interpreted by myself shows a sinus rhythm 88 bpm with a narrow QRS, normal axis, normal intervals with nonspecific ST changes  ____________________________________________    RADIOLOGY  Chest x-ray shows multifocal pneumonia  ____________________________________________   INITIAL IMPRESSION / ASSESSMENT AND PLAN / ED COURSE  Pertinent labs & imaging results that were available during my care of the patient were reviewed by me and considered in my medical decision making (see chart for details).   Patient presents emergency department for worsening shortness of breath cough and sputum production.  History of COPD and asthma wears 2  L of oxygen 24/7.  Currently requiring 6 L to maintain sats around 90 to 91%.  Record review performed by myself shows patient had Covid approximately 1 month ago, highly doubt Covid currently.  Given the patient's presentation with diminished lung sounds bilaterally highly suspect COPD exacerbation.  We will dose Solu-Medrol, duo nebs, check labs, chest x-ray and reassess.  Patient agreeable to plan.  Chest x-ray shows multifocal pneumonia.  Covid test is negative.  White blood cell count is significantly elevated.  Patient is tachypneic meeting sepsis criteria.  We will start the patient on IV antibiotics Levaquin and vancomycin to cover for healthcare associated pneumonia given  the patient's current living facility.  Patient will be admitted to the hospital service for continued work-up and treatment.  Currently satting 93% on 6 L.  Jeremiah [name] was evaluated in Emergency Department on 06/09/2020 for the symptoms described in the history of present illness. He was evaluated in the context of the global COVID-19 pandemic, which necessitated consideration that the patient might be at risk for infection with the SARS-CoV-2 virus that causes COVID-19. Institutional protocols and algorithms that pertain to the evaluation of patients at risk for COVID-19 are in a state of rapid change based on information released by regulatory bodies including the CDC and federal and state organizations. These policies and algorithms were followed during the patient's care in the ED.  CRITICAL CARE Performed by: Harvest Dark   Total critical care time: 30 minutes  Critical care time was exclusive of separately billable procedures and treating other patients.  Critical care was necessary to treat or prevent imminent or life-threatening deterioration.  Critical care was time spent personally by me on the following activities: development of treatment plan with patient and/or surrogate as well as nursing, discussions with consultants, evaluation of patient's response to treatment, examination of patient, obtaining history from patient or surrogate, ordering and performing treatments and interventions, ordering and review of laboratory studies, ordering and review of radiographic studies, pulse oximetry and re-evaluation of patient's condition.  ____________________________________________   FINAL CLINICAL IMPRESSION(S) / ED DIAGNOSES  Pneumonia Sepsis COPD   Harvest Dark, MD 06/09/20 2218

## 2020-06-09 NOTE — Progress Notes (Addendum)
Pharmacy Antibiotic Note  Jeremiah [name] is a 70 y.o. male admitted on 06/09/2020 with sepsis.  Pharmacy has been consulted for Vanc, Aztreonam dosing. CrCl = 89.5 ml/min  Could not confirm that pt has tolerated cephalosporins in the past, will continue with aztreonam.   Plan: Aztreonam 2 gm IV Q8H ordered to start on 2/2 @ 0600.  Vancomycin 2 gm IV X 1 ordered for 2/3 @ ~ 0000. Vancomycin 1250 mg IV Q12H ordered to start on 2/3 @ ~ 1200.  AUC = 504.1 Vanc trough = 14.2   Height: 6\' 1"  (185.4 cm) Weight: 87.5 kg (193 lb) IBW/kg (Calculated) : 79.9  Temp (24hrs), Avg:98.1 F (36.7 C), Min:98.1 F (36.7 C), Max:98.1 F (36.7 C)  Recent Labs  Lab 06/09/20 2110 06/09/20 2156  WBC 31.1*  --   CREATININE 0.88  --   LATICACIDVEN  --  1.4    Estimated Creatinine Clearance: 89.5 mL/min (by C-G formula based on SCr of 0.88 mg/dL).    Allergies  Allergen Reactions  . Penicillins Anaphylaxis, Swelling and Other (See Comments)    05/2020 Reaction happened in his childhood and his mother told him that he "swelled up." Tolerated meropenem x2.  Had a PCN reaction causing immediate rash, facial/tongue/throat swelling, SOB or lightheadedness with hypotension: Yes Had a PCN reaction causing severe rash involving mucus membranes or skin necrosis: No Had a PCN reaction that required hospitalization No Had a PCN reaction occurring within the last 10 years: No If all the above answers are "NO", may proceed with Cephalosporin use.    Antimicrobials this admission:   >>    >>   Dose adjustments this admission:   Microbiology results:  BCx:   UCx:    Sputum:    MRSA PCR:   Thank you for allowing pharmacy to be a part of this patient's care.  [name] 06/09/2020 11:24 PM

## 2020-06-09 NOTE — Progress Notes (Signed)
CODE SEPSIS - PHARMACY COMMUNICATION  **Broad Spectrum Antibiotics should be administered within 1 hour of Sepsis diagnosis**  Time Code Sepsis Called/Page Received: 2/2 @ 2259   Antibiotics Ordered:  Levaquin   Time of 1st antibiotic administration:  Levaquin 750 mg IV X 1 on 2/2 @ 2204.   Additional action taken by pharmacy:   If necessary, Name of Provider/Nurse Contacted:     Chilton Sallade D ,PharmD Clinical Pharmacist  06/09/2020  11:30 PM

## 2020-06-09 NOTE — Consult Note (Signed)
PHARMACY -  BRIEF ANTIBIOTIC NOTE   Pharmacy has received consult(s) for Levofloxacin from an ED provider.  The patient's profile has been reviewed for ht/wt/allergies/indication/available labs.    Documented anaphylaxis/swelling allergy documented. Patient has tolerated meropenem x 2. Unable to see documented administration of cephalosporins.   Continue with Levofloxacin 750 mg x 1 by ED Provider   Further antibiotics/pharmacy consults should be ordered by admitting physician if indicated.                       Thank you, Dorothe Pea, PharmD, BCPS Clinical Pharmacist  06/09/2020  9:58 PM

## 2020-06-10 ENCOUNTER — Encounter: Payer: Self-pay | Admitting: Internal Medicine

## 2020-06-10 DIAGNOSIS — J9621 Acute and chronic respiratory failure with hypoxia: Secondary | ICD-10-CM | POA: Diagnosis not present

## 2020-06-10 DIAGNOSIS — A419 Sepsis, unspecified organism: Secondary | ICD-10-CM | POA: Diagnosis not present

## 2020-06-10 DIAGNOSIS — R6520 Severe sepsis without septic shock: Secondary | ICD-10-CM

## 2020-06-10 LAB — COMPREHENSIVE METABOLIC PANEL
ALT: 22 U/L (ref 0–44)
AST: 19 U/L (ref 15–41)
Albumin: 3.2 g/dL — ABNORMAL LOW (ref 3.5–5.0)
Alkaline Phosphatase: 120 U/L (ref 38–126)
Anion gap: 8 (ref 5–15)
BUN: 17 mg/dL (ref 8–23)
CO2: 24 mmol/L (ref 22–32)
Calcium: 8.6 mg/dL — ABNORMAL LOW (ref 8.9–10.3)
Chloride: 93 mmol/L — ABNORMAL LOW (ref 98–111)
Creatinine, Ser: 0.62 mg/dL (ref 0.61–1.24)
GFR, Estimated: >60 mL/min (ref >60)
Glucose, Bld: 142 mg/dL — ABNORMAL HIGH (ref 70–99)
Potassium: 4.1 mmol/L (ref 3.5–5.1)
Sodium: 125 mmol/L — ABNORMAL LOW (ref 135–145)
Total Bilirubin: 0.7 mg/dL (ref 0.3–1.2)
Total Protein: 7.1 g/dL (ref 6.5–8.1)

## 2020-06-10 LAB — CBC WITH DIFFERENTIAL/PLATELET
Abs Immature Granulocytes: 0.49 10*3/uL — ABNORMAL HIGH (ref 0.00–0.07)
Basophils Absolute: 0.1 10*3/uL (ref 0.0–0.1)
Basophils Relative: 0 %
Eosinophils Absolute: 0.1 10*3/uL (ref 0.0–0.5)
Eosinophils Relative: 0 %
HCT: 31.1 % — ABNORMAL LOW (ref 39.0–52.0)
Hemoglobin: 10.5 g/dL — ABNORMAL LOW (ref 13.0–17.0)
Immature Granulocytes: 2 %
Lymphocytes Relative: 1 %
Lymphs Abs: 0.3 10*3/uL — ABNORMAL LOW (ref 0.7–4.0)
MCH: 29.5 pg (ref 26.0–34.0)
MCHC: 33.8 g/dL (ref 30.0–36.0)
MCV: 87.4 fL (ref 80.0–100.0)
Monocytes Absolute: 0.9 10*3/uL (ref 0.1–1.0)
Monocytes Relative: 3 %
Neutro Abs: 27.3 10*3/uL — ABNORMAL HIGH (ref 1.7–7.7)
Neutrophils Relative %: 94 %
Platelets: 372 10*3/uL (ref 150–400)
RBC: 3.56 MIL/uL — ABNORMAL LOW (ref 4.22–5.81)
RDW: 14.9 % (ref 11.5–15.5)
Smear Review: NORMAL
WBC: 29.2 10*3/uL — ABNORMAL HIGH (ref 4.0–10.5)
nRBC: 0 % (ref 0.0–0.2)

## 2020-06-10 LAB — TSH: TSH: 1.04 u[IU]/mL (ref 0.350–4.500)

## 2020-06-10 LAB — URINALYSIS, COMPLETE (UACMP) WITH MICROSCOPIC
Bacteria, UA: NONE SEEN
Bilirubin Urine: NEGATIVE
Glucose, UA: NEGATIVE mg/dL
Hgb urine dipstick: NEGATIVE
Ketones, ur: NEGATIVE mg/dL
Leukocytes,Ua: NEGATIVE
Nitrite: NEGATIVE
Protein, ur: NEGATIVE mg/dL
Specific Gravity, Urine: 1.012 (ref 1.005–1.030)
pH: 5 (ref 5.0–8.0)

## 2020-06-10 LAB — PROCALCITONIN
Procalcitonin: 0.84 ng/mL
Procalcitonin: 1.02 ng/mL

## 2020-06-10 LAB — PROTIME-INR
INR: 1.4 — ABNORMAL HIGH (ref 0.8–1.2)
Prothrombin Time: 16.3 seconds — ABNORMAL HIGH (ref 11.4–15.2)

## 2020-06-10 LAB — PHOSPHORUS: Phosphorus: 2.6 mg/dL (ref 2.5–4.6)

## 2020-06-10 LAB — TROPONIN I (HIGH SENSITIVITY)
Troponin I (High Sensitivity): 13 ng/L (ref <18)
Troponin I (High Sensitivity): 29 ng/L — ABNORMAL HIGH (ref <18)
Troponin I (High Sensitivity): 115 ng/L (ref <18)
Troponin I (High Sensitivity): 9 ng/L (ref <18)

## 2020-06-10 LAB — CK: Total CK: 74 U/L (ref 49–397)

## 2020-06-10 LAB — EXPECTORATED SPUTUM ASSESSMENT W GRAM STAIN, RFLX TO RESP C

## 2020-06-10 LAB — LACTATE DEHYDROGENASE: LDH: 214 U/L — ABNORMAL HIGH (ref 98–192)

## 2020-06-10 LAB — STREP PNEUMONIAE URINARY ANTIGEN: Strep Pneumo Urinary Antigen: NEGATIVE

## 2020-06-10 LAB — SODIUM, URINE, RANDOM: Sodium, Ur: 25 mmol/L

## 2020-06-10 LAB — CREATININE, URINE, RANDOM: Creatinine, Urine: 53 mg/dL

## 2020-06-10 LAB — HIV ANTIBODY (ROUTINE TESTING W REFLEX): HIV Screen 4th Generation wRfx: NONREACTIVE

## 2020-06-10 LAB — MAGNESIUM: Magnesium: 1.6 mg/dL — ABNORMAL LOW (ref 1.7–2.4)

## 2020-06-10 LAB — FERRITIN: Ferritin: 219 ng/mL (ref 24–336)

## 2020-06-10 LAB — C-REACTIVE PROTEIN: CRP: 29.5 mg/dL — ABNORMAL HIGH (ref <1.0)

## 2020-06-10 MED ORDER — ENOXAPARIN SODIUM 40 MG/0.4ML ~~LOC~~ SOLN
40.0000 mg | SUBCUTANEOUS | Status: DC
  Administered 2020-06-10 – 2020-06-17 (×8): 40 mg via SUBCUTANEOUS
  Filled 2020-06-10 (×8): qty 0.4

## 2020-06-10 MED ORDER — ALBUTEROL SULFATE (2.5 MG/3ML) 0.083% IN NEBU
2.5000 mg | INHALATION_SOLUTION | RESPIRATORY_TRACT | Status: DC | PRN
  Administered 2020-06-13 – 2020-06-17 (×8): 2.5 mg via RESPIRATORY_TRACT
  Filled 2020-06-10 (×10): qty 3

## 2020-06-10 MED ORDER — NICOTINE 21 MG/24HR TD PT24
21.0000 mg | MEDICATED_PATCH | Freq: Every day | TRANSDERMAL | Status: DC
  Administered 2020-06-10 – 2020-06-17 (×8): 21 mg via TRANSDERMAL
  Filled 2020-06-10 (×8): qty 1

## 2020-06-10 MED ORDER — GUAIFENESIN ER 600 MG PO TB12
600.0000 mg | ORAL_TABLET | Freq: Two times a day (BID) | ORAL | Status: DC
  Administered 2020-06-10 – 2020-06-17 (×16): 600 mg via ORAL
  Filled 2020-06-10 (×16): qty 1

## 2020-06-10 MED ORDER — ALBUTEROL SULFATE HFA 108 (90 BASE) MCG/ACT IN AERS
2.0000 | INHALATION_SPRAY | RESPIRATORY_TRACT | Status: DC | PRN
  Filled 2020-06-10: qty 6.7

## 2020-06-10 MED ORDER — IPRATROPIUM-ALBUTEROL 0.5-2.5 (3) MG/3ML IN SOLN
3.0000 mL | RESPIRATORY_TRACT | Status: DC
  Administered 2020-06-10 – 2020-06-13 (×20): 3 mL via RESPIRATORY_TRACT
  Filled 2020-06-10 (×20): qty 3

## 2020-06-10 MED ORDER — ACETAMINOPHEN 325 MG PO TABS
650.0000 mg | ORAL_TABLET | Freq: Four times a day (QID) | ORAL | Status: DC | PRN
  Administered 2020-06-10 – 2020-06-17 (×20): 650 mg via ORAL
  Filled 2020-06-10 (×19): qty 2

## 2020-06-10 MED ORDER — PANTOPRAZOLE SODIUM 20 MG PO TBEC
20.0000 mg | DELAYED_RELEASE_TABLET | Freq: Every day | ORAL | Status: DC
Start: 2020-06-10 — End: 2020-06-17
  Administered 2020-06-10 – 2020-06-17 (×8): 20 mg via ORAL
  Filled 2020-06-10 (×8): qty 1

## 2020-06-10 MED ORDER — ACETAMINOPHEN 650 MG RE SUPP: 650.0000 mg | Freq: Four times a day (QID) | RECTAL | Status: DC | PRN

## 2020-06-10 MED ORDER — SODIUM CHLORIDE 0.9 % IV SOLN
75.0000 mL/h | INTRAVENOUS | Status: AC
  Administered 2020-06-10: 75 mL/h via INTRAVENOUS

## 2020-06-10 MED ORDER — MONTELUKAST SODIUM 10 MG PO TABS
10.0000 mg | ORAL_TABLET | Freq: Every day | ORAL | Status: DC
  Administered 2020-06-10 – 2020-06-16 (×8): 10 mg via ORAL
  Filled 2020-06-10 (×8): qty 1

## 2020-06-10 MED ORDER — MAGNESIUM SULFATE 2 GM/50ML IV SOLN
2.0000 g | Freq: Once | INTRAVENOUS | Status: AC
  Administered 2020-06-10: 2 g via INTRAVENOUS
  Filled 2020-06-10: qty 50

## 2020-06-10 MED ORDER — ATORVASTATIN CALCIUM 20 MG PO TABS
40.0000 mg | ORAL_TABLET | Freq: Every day | ORAL | Status: DC
  Administered 2020-06-10 – 2020-06-17 (×8): 40 mg via ORAL
  Filled 2020-06-10 (×8): qty 2

## 2020-06-10 NOTE — ED Notes (Signed)
Pt has had breakfast, emptied pt urinal and repositioned pt for comfort. Pt is in no distress at this time, watching TV, encouraged coughing, deep breathing and pulmonary toilet, gave pt specimen cup in case he can produce sputum and advised that sample is needed for culture

## 2020-06-10 NOTE — Evaluation (Signed)
Physical Therapy Evaluation Patient Details Name: Jeremiah [name] MRN: [id number] DOB: [birth year] Today's Date: 06/10/2020   History of Present Illness  presented to ER secondary to progressive SOB, cough x2-3 days; admitted for management of acute/chronic respiratory failure with hypoxia due to COPD exacerbation.  Of note, recently hospitalized (Jan, 2021) due to R foot cellulitis, covid +.  Clinical Impression  Patient alert and oriented; follows commands and agreeable to session.  Denies pain and endorses LE/foot wounds "have completely healed up" since last admission.  Able to complete bed mobility with mod indep; sit/stand, basic transfers and gait (5' x2 forward/backward/lateral) with RW, cga/close sup.  Demonstrates fair step height/length, mild forward flexed posture; mod SOB with minimal exertion (sats 89-91% on 6L supplemental O2).  Additional distance/activity limited as result; patient requesting rest period.  Will continue to assess/progress in subsequent sessions as appropriate. Would benefit from skilled PT to address above deficits and promote optimal return to PLOF.; Recommend transition to HHPT upon discharge from acute hospitalization.     Follow Up Recommendations Home health PT    Equipment Recommendations       Recommendations for Other Services       Precautions / Restrictions Precautions Precautions: Fall Restrictions Weight Bearing Restrictions: No      Mobility  Bed Mobility Overal bed mobility: Modified Independent                  Transfers Overall transfer level: Needs assistance Equipment used: Rolling walker (2 wheeled) Transfers: Sit to/from Stand Sit to Stand: Min guard         General transfer comment: cuing for hand placement to prevent pulling on RW  Ambulation/Gait Ambulation/Gait assistance: Min guard Gait Distance (Feet):  (5' forward/backward/lateral x2) Assistive device: Rolling walker (2 wheeled)       General Gait  Details: fair step height/length, mild forward flexed posture; mod SOB with minimal exertion (sats 89-91% on 6L supplemental O2).  Additional distance/activity limited as result; patient requesting rest period  Stairs            Wheelchair Mobility    Modified Rankin (Stroke Patients Only)       Balance Overall balance assessment: Needs assistance Sitting-balance support: No upper extremity supported;Feet supported Sitting balance-Leahy Scale: Good     Standing balance support: Bilateral upper extremity supported Standing balance-Leahy Scale: Fair                               Pertinent Vitals/Pain Pain Assessment: No/denies pain    Home Living Family/patient expects to be discharged to:: Private residence Living Arrangements: Other relatives Available Help at Discharge: Friend(s) Type of Home: Mobile home Home Access: Stairs to enter Entrance Stairs-Rails: Right;Left Entrance Stairs-Number of Steps: 10 at front, bilat rails; 2 at back door, but unable to access back door. Home Layout: One level Home Equipment: Grab bars - toilet      Prior Function Level of Independence: Needs assistance         Comments: Household ambulator without assist device, home O2 at 2L (mostly at night per patient report); difficulty with ADLs at times (unable to bend over/reach feet due to respiratory compromise).  Does endorse at least one fall in previous year     Hand Dominance   Dominant Hand: Right    Extremity/Trunk Assessment   Upper Extremity Assessment Upper Extremity Assessment: Overall WFL for tasks assessed    Lower Extremity Assessment  Lower Extremity Assessment: Overall WFL for tasks assessed (grossly 4 to 4+/5 throughout)       Communication   Communication: No difficulties  Cognition Arousal/Alertness: Awake/alert Behavior During Therapy: WFL for tasks assessed/performed Overall Cognitive Status: Within Functional Limits for tasks assessed                                         General Comments      Exercises Other Exercises Other Exercises: Educated in role of PT and progressive mobility; reviewed importance of activity pacing, energy conservation and self-initiated rest breaks with signs/symptoms of fatigue.  Patient voiced understanding; will continue to reinforce with functional activities.   Assessment/Plan    PT Assessment Patient needs continued PT services  PT Problem List Decreased strength;Decreased activity tolerance;Decreased mobility;Decreased knowledge of precautions;Decreased balance;Cardiopulmonary status limiting activity       PT Treatment Interventions DME instruction;Balance training;Stair training;Functional mobility training;Therapeutic activities;Therapeutic exercise;Patient/family education;Gait training    PT Goals (Current goals can be found in the Care Plan section)  Acute Rehab PT Goals Patient Stated Goal: to improve breathing PT Goal Formulation: With patient Time For Goal Achievement: 06/24/20 Potential to Achieve Goals: Good    Frequency Min 2X/week   Barriers to discharge        Co-evaluation               AM-PAC PT "6 Clicks" Mobility  Outcome Measure Help needed turning from your back to your side while in a flat bed without using bedrails?: None Help needed moving from lying on your back to sitting on the side of a flat bed without using bedrails?: None Help needed moving to and from a bed to a chair (including a wheelchair)?: A Little Help needed standing up from a chair using your arms (e.g., wheelchair or bedside chair)?: A Little Help needed to walk in hospital room?: A Little Help needed climbing 3-5 steps with a railing? : A Little 6 Click Score: 20    End of Session Equipment Utilized During Treatment: Oxygen Activity Tolerance: Patient tolerated treatment well Patient left: in bed;with call bell/phone within reach Nurse Communication:  Mobility status PT Visit Diagnosis: Muscle weakness (generalized) (M62.81);Difficulty in walking, not elsewhere classified (R26.2)    Time: 4315-4008 PT Time Calculation (min) (ACUTE ONLY): 23 min   Charges:   PT Evaluation $PT Eval Moderate Complexity: 1 Mod PT Treatments $Therapeutic Activity: 8-22 mins       Kashlynn Kundert H. Owens Shark, PT, DPT, NCS 06/10/20, 2:13 PM [phone]

## 2020-06-10 NOTE — ED Notes (Signed)
Pt breathing through mouth, sats 80s on [place]. Pt switched to NRB 15L at this time, sats 97%

## 2020-06-10 NOTE — Progress Notes (Addendum)
PROGRESS NOTE    Jeremiah [name]  WPY:[id number] DOB: [birth year] DOA: 06/09/2020 PCP: System, Provider Not In   Brief Narrative:  This patient is 70 years old male with PMH significant for COPD on home oxygen at 2 L, hypertension, tobacco abuse, hyponatremia, GERD, history of recent Covid infection presents in the emergency department with productive cough and shortness of breath.  He is found to be hypoxic,  requiring 6 L of oxygen to maintain saturation above 91%, found to have leukocytosis.  He was hospitalized last month for Covid pneumonia and was treated with remdesivir, steroids bronchodilators and has completed treatment while hospitalized. Patient is admitted for sepsis secondary to multifocal pneumonia and acute on chronic hypoxic respiratory failure requiring 6 L of oxygen.  Assessment & Plan:   Active Problems:   Acute on chronic respiratory failure with hypoxia (HCC)   Tobacco dependency   COPD with chronic bronchitis (HCC)   Hyponatremia   Sepsis ([place])   HTN (hypertension)   Severe sepsis secondary to multifocal pneumonia. Patient was found to be tachycardic, tachypneic, leucocytosis, hypoxic with chest x-ray shows infiltrate.  Procalcitonin 1.02, lactic acid 1.4. Initiated on empirical antibiotics (vancomycin , aztreonam and Zithromax.) Awaiting blood cultures. Descalate antibiotics after cultures back Patient was well hydrated in the ER. monitor wbc trend.  Acute on chronic hypoxic respiratory failure: Most likely secondary to bacterial superinfection with previous history of Covid. Continue supplemental oxygen to keep saturation above 94%. Continue steroids and nebulization. Consider pulmonology consult if no improvement. Patient has completed remdesivir treatment on previous admission. He is weaned down to 4 L/ min Via . [facility]  Hypertension: Resume home medications.  Continue Cardizem and lisinopril but hold HCTZ  COPD: Continue albuterol as  needed. Continue supplemental oxygen.   Chronic hyponatremia: Differential diagnosis pneumonia,, hydrochlorothiazide and dehydration. Continue IV hydration,  hold HCTZ. Obtain urine electrolytes Recheck a.m. labs  Hyperlipidemia:  continue Lipitor  Elevated troponin :  could be due to demand ischemia from sepsis. Troponin slightly trending up. Consider cardiology if troponin continue to trend up Patient denies any chest, dizziness, palpitations  DVT prophylaxis:  Lovenox Code Status: Full code. Family Communication:  No family at bed side. Disposition Plan:   Status is: Inpatient  Remains inpatient appropriate because:Inpatient level of care appropriate due to severity of illness   Dispo: The patient is from: Home              Anticipated d/c is to: Home              Anticipated d/c date is: > 3 days              Patient currently is not medically stable to d/c.   Difficult to place patient No    Consultants:   None  Procedures:  Antimicrobials:   Anti-infectives (From admission, onward)   Start     Dose/Rate Route Frequency Ordered Stop   06/10/20 2200  azithromycin (ZITHROMAX) 500 mg in sodium chloride 0.9 % 250 mL IVPB        500 mg 250 mL/hr over 60 Minutes Intravenous Every 24 hours 06/09/20 2300     06/10/20 1200  vancomycin (VANCOREADY) IVPB 1250 mg/250 mL        1,250 mg 166.7 mL/hr over 90 Minutes Intravenous Every 12 hours 06/09/20 2323     06/10/20 0600  aztreonam (AZACTAM) 2 g in sodium chloride 0.9 % 100 mL IVPB        2 g 200  mL/hr over 30 Minutes Intravenous Every 8 hours 06/09/20 2319     06/09/20 2330  vancomycin (VANCOREADY) IVPB 2000 mg/400 mL        2,000 mg 200 mL/hr over 120 Minutes Intravenous  Once 06/09/20 2320 06/10/20 0302   06/09/20 2230  vancomycin (VANCOCIN) IVPB 1000 mg/200 mL premix  Status:  Discontinued        1,000 mg 200 mL/hr over 60 Minutes Intravenous  Once 06/09/20 2217 06/09/20 2320   06/09/20 2200  levofloxacin  (LEVAQUIN) IVPB 750 mg        750 mg 100 mL/hr over 90 Minutes Intravenous  Once 06/09/20 2148 06/09/20 2346     Subjective: Patient was seen and examined at bedside.  Overnight events noted.  Patient reports feeling slightly better,  still has cough and significant shortness of breath but getting better.  Objective: Vitals:   06/10/20 1200 06/10/20 1230 06/10/20 1300 06/10/20 1405  BP: (!) 136/58 (!) 114/59 136/75 (!) 150/79  Pulse: (!) 107 (!) 104 95 (!) 110  Resp: (!) 26 (!) 27 (!) 21 18  Temp: 98.2 F (36.8 C)   98.1 F (36.7 C)  TempSrc: Oral     SpO2: 94% 94% 93% 93%  Weight:      Height:        Intake/Output Summary (Last 24 hours) at 06/10/2020 1539 Last data filed at 06/10/2020 0654 Gross per 24 hour  Intake 1647.11 ml  Output -  Net 1647.11 ml   Filed Weights   06/09/20 2101  Weight: 87.5 kg    Examination:  General exam: Appears calm and comfortable, not in any acute distress. Respiratory system: Clear to auscultation. Respiratory effort normal. Cardiovascular system: S1 & S2 heard, RRR. No JVD, murmurs, rubs, gallops or clicks. No pedal edema. Gastrointestinal system: Abdomen is nondistended, soft and nontender. No organomegaly or masses felt. Normal bowel sounds heard. Central nervous system: Alert and oriented. No focal neurological deficits. Extremities: Symmetric 5 x 5 power. No edema, no cyanosis, no clubbing Skin: No rashes, lesions or ulcers Psychiatry: Judgement and insight appear normal. Mood & affect appropriate.     Data Reviewed: I have personally reviewed following labs and imaging studies  CBC: Recent Labs  Lab 06/09/20 2110 06/10/20 0518  WBC 31.1* 29.2*  NEUTROABS  --  27.3*  HGB 10.4* 10.5*  HCT 30.5* 31.1*  MCV 86.6 87.4  PLT 369 810   Basic Metabolic Panel: Recent Labs  Lab 06/09/20 2110 06/10/20 0518  NA 123* 125*  K 4.2 4.1  CL 88* 93*  CO2 24 24  GLUCOSE 109* 142*  BUN 20 17  CREATININE 0.88 0.62  CALCIUM 8.6*  8.6*  MG  --  1.6*  PHOS  --  2.6   GFR: Estimated Creatinine Clearance: 98.5 mL/min (by C-G formula based on SCr of 0.62 mg/dL). Liver Function Tests: Recent Labs  Lab 06/09/20 2110 06/10/20 0518  AST 24 19  ALT 21 22  ALKPHOS 122 120  BILITOT 1.1 0.7  PROT 6.7 7.1  ALBUMIN 3.2* 3.2*   No results for input(s): LIPASE, AMYLASE in the last 168 hours. No results for input(s): AMMONIA in the last 168 hours. Coagulation Profile: Recent Labs  Lab 06/09/20 2326  INR 1.4*   Cardiac Enzymes: Recent Labs  Lab 06/10/20 0518  CKTOTAL 74   BNP (last 3 results) No results for input(s): PROBNP in the last 8760 hours. HbA1C: No results for input(s): HGBA1C in the last 72 hours. CBG: No  results for input(s): GLUCAP in the last 168 hours. Lipid Profile: No results for input(s): CHOL, HDL, LDLCALC, TRIG, CHOLHDL, LDLDIRECT in the last 72 hours. Thyroid Function Tests: Recent Labs    06/10/20 0518  TSH 1.040   Anemia Panel: Recent Labs    06/10/20 0518  FERRITIN 219   Sepsis Labs: Recent Labs  Lab 06/09/20 2156 06/09/20 2326 06/10/20 0518  PROCALCITON  --  0.84 1.02  LATICACIDVEN 1.4  --   --     Recent Results (from the past 240 hour(s))  SARS Coronavirus 2 by RT PCR (hospital order, performed in [place] hospital lab) Nasopharyngeal Nasopharyngeal Swab     Status: None   Collection Time: 06/09/20  9:02 PM   Specimen: Nasopharyngeal Swab  Result Value Ref Range Status   SARS Coronavirus 2 NEGATIVE NEGATIVE Final    Comment: (NOTE) SARS-CoV-2 target nucleic acids are NOT DETECTED.  The SARS-CoV-2 RNA is generally detectable in upper and lower respiratory specimens during the acute phase of infection. The lowest concentration of SARS-CoV-2 viral copies this assay can detect is 250 copies / mL. A negative result does not preclude SARS-CoV-2 infection and should not be used as the sole basis for treatment or other patient management decisions.  A negative  result may occur with improper specimen collection / handling, submission of specimen other than nasopharyngeal swab, presence of viral mutation(s) within the areas targeted by this assay, and inadequate number of viral copies (<250 copies / mL). A negative result must be combined with clinical observations, patient history, and epidemiological information.  Fact Sheet for Patients:   StrictlyIdeas.no  Fact Sheet for Healthcare Providers: BankingDealers.co.za  This test is not yet approved or  cleared by the [place] FDA and has been authorized for detection and/or diagnosis of SARS-CoV-2 by FDA under an Emergency Use Authorization (EUA).  This EUA will remain in effect (meaning this test can be used) for the duration of the COVID-19 declaration under Section 564(b)(1) of the Act, 21 U.S.C. section 360bbb-3(b)(1), unless the authorization is terminated or revoked sooner.  Performed at [facility], [address] [place], [place] [address]   Blood culture (routine x 2)     Status: None (Preliminary result)   Collection Time: 06/09/20  9:50 PM   Specimen: BLOOD  Result Value Ref Range Status   Specimen Description BLOOD LEFT ANTECUBITAL  Final   Special Requests   Final    BOTTLES DRAWN AEROBIC AND ANAEROBIC Blood Culture results may not be optimal due to an inadequate volume of blood received in culture bottles   Culture   Final    NO GROWTH < 12 HOURS Performed at [facility], [address], [place], [place] [address]    Report Status PENDING  Incomplete  Blood culture (routine x 2)     Status: None (Preliminary result)   Collection Time: 06/09/20  9:56 PM   Specimen: BLOOD  Result Value Ref Range Status   Specimen Description BLOOD RIGHT ANTECUBITAL  Final   Special Requests   Final    BOTTLES DRAWN AEROBIC AND ANAEROBIC Blood Culture results may not be optimal due to an inadequate volume of blood  received in culture bottles   Culture   Final    NO GROWTH < 12 HOURS Performed at [facility], [address], [place], [place] [address]    Report Status PENDING  Incomplete  Culture, sputum-assessment     Status: None   Collection Time:  06/10/20  1:00 PM   Specimen: Expectorated Sputum  Result Value Ref Range Status   Specimen Description EXPECTORATED SPUTUM  Final   Special Requests NONE  Final   Sputum evaluation   Final    THIS SPECIMEN IS ACCEPTABLE FOR SPUTUM CULTURE Performed at [facility], [address] [place], [place] [address]    Report Status 06/10/2020 FINAL  Final         Radiology Studies: DG Chest Portable 1 View  Result Date: 06/09/2020 CLINICAL DATA:  Shortness of breath EXAM: PORTABLE CHEST 1 VIEW COMPARISON:  May 14, 2020 FINDINGS: The heart size and mediastinal contours are mildly enlarged. Aortic knob calcifications are seen. Multifocal patchy interstitial opacities are seen throughout lower lungs, right greater than left. Biapical scarring is noted. No acute osseous abnormality. IMPRESSION: Multifocal airspace opacities, right greater than left, likely consistent with multifocal pneumonia Electronically Signed   By: Prudencio Pair M.D.   On: 06/09/2020 21:26        Scheduled Meds: . atorvastatin  40 mg Oral Daily  . enoxaparin (LOVENOX) injection  40 mg Subcutaneous Q24H  . guaiFENesin  600 mg Oral BID  . ipratropium-albuterol  3 mL Nebulization Q4H  . montelukast  10 mg Oral QHS  . nicotine  21 mg Transdermal Daily  . pantoprazole  20 mg Oral Daily   Continuous Infusions: . azithromycin    . aztreonam Stopped (06/10/20 0654)  . vancomycin Stopped (06/10/20 1326)     LOS: 1 day    Time spent: 35 mins    Shavonn Convey, MD [facility]   If 7PM-7AM, please contact night-coverage

## 2020-06-10 NOTE — ED Notes (Signed)
Pt is on 2L [place] at home and here he was on NRB15L.  Switched pt to 4L [place] at this time and will observe how he does with this.

## 2020-06-10 NOTE — Evaluation (Signed)
Occupational Therapy Evaluation Patient Details Name: Jeremiah [name] MRN: [id number] DOB: [birth year] Today's Date: 06/10/2020    History of Present Illness presented to ER secondary to progressive SOB, cough x2-3 days; admitted for management of acute/chronic respiratory failure with hypoxia due to COPD exacerbation.  Of note, recently hospitalized (Jan, 2021) due to R foot cellulitis, covid +.   Clinical Impression   Pt seen for OT evaluation this date in setting of acute hospitalization for b/l LE edema. Pt was recieveing skilled rehabilitation at [facility] but adamently reports he wants to go back home with roommates. Pt reports that before the start of this year, he was relatively INDEP with basic self care and only needed occasional assist with IADLs d/t low functional activity tolerance. Pt has been receiving increased assist with ADLs/ADL mobility since going to rehabilitation in January of 2022. Pt presents this date needing only slight assist with ADLs/ADL mobility. On assessment, he requires SETUP for seated UB ADLs, MIN A for seated LB ADLs, CGA for ADL transfers. Pt declines when offered RW, but noted to have posterior LOB and educated about fall prevention/walker use.  Pt agreeable to use of RW on future ADL mobility trials. Will continue to follow acutely, but anticiapte pt will be able to return home with HHOT and intermittent SUPV as well as the below-listed equipment for safety and energy conservation.     Follow Up Recommendations  Home health OT;Supervision - Intermittent    Equipment Recommendations  3 in 1 bedside commode;Tub/shower seat;Other (comment) (2ww)    Recommendations for Other Services       Precautions / Restrictions Precautions Precautions: Fall Precaution Comments: foot wounds Restrictions Weight Bearing Restrictions: No Other Position/Activity Restrictions: b/l foot wounds and terribly long toe nails      Mobility Bed Mobility Overal bed mobility:  Modified Independent                  Transfers Overall transfer level: Needs assistance Equipment used: 1 person hand held assist Transfers: Sit to/from Stand Sit to Stand: Min guard         General transfer comment: pt declines use of RW when offered by OT, but is noted to have small posterior LOB. Educated on benefits of walker use and importance of fall prevention and is agreeable to use in future trials.    Balance Overall balance assessment: Needs assistance Sitting-balance support: No upper extremity supported;Feet supported Sitting balance-Leahy Scale: Good     Standing balance support: Bilateral upper extremity supported Standing balance-Leahy Scale: Fair Standing balance comment: benefits from UE support.                           ADL either performed or assessed with clinical judgement   ADL Overall ADL's : Needs assistance/impaired                                       General ADL Comments: SETUP for seated UB ADLs, MIN A for seated LB ADLs, CGA for ADL transfers, declines when offered RW, but noted to have posterior LOB and educated about fall prevention/walker use.     Vision Patient Visual Report: No change from baseline       Perception     Praxis      Pertinent Vitals/Pain Pain Assessment: No/denies pain Pain Location: LEs     Hand  Dominance Right   Extremity/Trunk Assessment Upper Extremity Assessment Upper Extremity Assessment: RUE deficits/detail;LUE deficits/detail RUE Deficits / Details: 4+/5 grip, elbow. Pt completes 3/4 shld ROM flexion. LUE Deficits / Details: h/o rotator cuff issues on L side, shld ROM to 1/4 range of flexion/abduction. grip MMT 4/5   Lower Extremity Assessment Lower Extremity Assessment: Defer to PT evaluation;Overall WFL for tasks assessed;Generalized weakness (ROM functional, MMT grossly 4/5)       Communication Communication Communication: No difficulties   Cognition                                            General Comments       Exercises Other Exercises Other Exercises: Educated on role of OT in acute setting, importance of OOB activity, importance of fall prevention. Pt with good reception.   Shoulder Instructions      Home Living Family/patient expects to be discharged to:: Private residence Living Arrangements: Other relatives Available Help at Discharge: Friend(s) Type of Home: Mobile home Home Access: Stairs to enter Entrance Stairs-Number of Steps: 10 at front, bilat rails; 2 at back door, but unable to access back door. Entrance Stairs-Rails: Right;Left Home Layout: One level     Bathroom Shower/Tub: Occupational psychologist: Standard     Home Equipment: Grab bars - toilet   Additional Comments: Pt reports no grab bars in shower or by toilet, unable to access a walker.      Prior Functioning/Environment Level of Independence: Needs assistance  Gait / Transfers Assistance Needed: no AD in the home for fxl mobility. ADL's / Homemaking Assistance Needed: Reports he manages BADLs I'ly including toileting and grooming. Pt requires some assist occasionally for IADLs. Usually MOD I with bathing/dressing but was requiring increased assistance in past month or more.   Comments: home O2 at night primarily, duenebs during the day. One fall in last 12 months        OT Problem List: Decreased strength;Decreased range of motion;Decreased activity tolerance;Decreased knowledge of use of DME or AE      OT Treatment/Interventions: Self-care/ADL training;Therapeutic exercise;Energy conservation;DME and/or AE instruction;Therapeutic activities;Patient/family education    OT Goals(Current goals can be found in the care plan section) Acute Rehab OT Goals Patient Stated Goal: to improve breathing Time For Goal Achievement: 06/24/20 Potential to Achieve Goals: Good ADL Goals Pt Will Perform Lower Body Bathing: with  set-up;with supervision;sit to/from stand (with AE PRN) Pt Will Perform Lower Body Dressing: with supervision;sit to/from stand (with AE/LRAD PRN) Pt Will Transfer to Toilet: with supervision;with min guard assist;ambulating;bedside commode;grab bars (BSC in restroom over standard commode with use of grab bars and RW PRN) Pt Will Perform Toileting - Clothing Manipulation and hygiene: with supervision;sit to/from stand Pt/caregiver will Perform Home Exercise Program: Increased strength;Right Upper extremity;Increased ROM;Left upper extremity;With minimal assist  OT Frequency: Min 1X/week   Barriers to D/C: Inaccessible home environment          Co-evaluation              AM-PAC OT "6 Clicks" Daily Activity     Outcome Measure Help from another person eating meals?: None Help from another person taking care of personal grooming?: A Little Help from another person toileting, which includes using toliet, bedpan, or urinal?: A Little Help from another person bathing (including washing, rinsing, drying)?: A Little Help from another person  to put on and taking off regular upper body clothing?: A Little Help from another person to put on and taking off regular lower body clothing?: A Little 6 Click Score: 19   End of Session Equipment Utilized During Treatment: Oxygen;Gait belt Nurse Communication: Mobility status  Activity Tolerance: Patient tolerated treatment well Patient left: in bed;with call bell/phone within reach;with bed alarm set  OT Visit Diagnosis: Other abnormalities of gait and mobility (R26.89);Muscle weakness (generalized) (M62.81);History of falling (Z91.81)                Time: 1017-5102 OT Time Calculation (min): 38 min Charges:  OT General Charges $OT Visit: 1 Visit OT Evaluation $OT Eval Moderate Complexity: 1 Mod OT Treatments $Self Care/Home Management : 8-22 mins $Therapeutic Activity: 8-22 mins  Gerrianne Scale, MS, OTR/L ascom [phone] 06/10/20, 7:03  PM

## 2020-06-10 NOTE — ED Notes (Signed)
Pt placed back on [place] at this time

## 2020-06-11 ENCOUNTER — Inpatient Hospital Stay (HOSPITAL_COMMUNITY)
Admit: 2020-06-11 | Discharge: 2020-06-11 | Disposition: A | Discharge status: Home / Self Care | Payer: Medicare HMO | Attending: Internal Medicine | Admitting: Internal Medicine

## 2020-06-11 DIAGNOSIS — J9621 Acute and chronic respiratory failure with hypoxia: Secondary | ICD-10-CM | POA: Diagnosis not present

## 2020-06-11 DIAGNOSIS — J449 Chronic obstructive pulmonary disease, unspecified: Secondary | ICD-10-CM | POA: Diagnosis not present

## 2020-06-11 LAB — COMPREHENSIVE METABOLIC PANEL
ALT: 36 U/L (ref 0–44)
AST: 33 U/L (ref 15–41)
Albumin: 2.8 g/dL — ABNORMAL LOW (ref 3.5–5.0)
Alkaline Phosphatase: 134 U/L — ABNORMAL HIGH (ref 38–126)
Anion gap: 7 (ref 5–15)
BUN: 19 mg/dL (ref 8–23)
CO2: 23 mmol/L (ref 22–32)
Calcium: 8.6 mg/dL — ABNORMAL LOW (ref 8.9–10.3)
Chloride: 93 mmol/L — ABNORMAL LOW (ref 98–111)
Creatinine, Ser: 0.55 mg/dL — ABNORMAL LOW (ref 0.61–1.24)
GFR, Estimated: >60 mL/min (ref >60)
Glucose, Bld: 96 mg/dL (ref 70–99)
Potassium: 3.8 mmol/L (ref 3.5–5.1)
Sodium: 123 mmol/L — ABNORMAL LOW (ref 135–145)
Total Bilirubin: 0.7 mg/dL (ref 0.3–1.2)
Total Protein: 6.3 g/dL — ABNORMAL LOW (ref 6.5–8.1)

## 2020-06-11 LAB — BRAIN NATRIURETIC PEPTIDE: B Natriuretic Peptide: 156.3 pg/mL — ABNORMAL HIGH (ref 0.0–100.0)

## 2020-06-11 LAB — CBC
HCT: 30.4 % — ABNORMAL LOW (ref 39.0–52.0)
Hemoglobin: 10.6 g/dL — ABNORMAL LOW (ref 13.0–17.0)
MCH: 30.4 pg (ref 26.0–34.0)
MCHC: 34.9 g/dL (ref 30.0–36.0)
MCV: 87.1 fL (ref 80.0–100.0)
Platelets: 345 10*3/uL (ref 150–400)
RBC: 3.49 MIL/uL — ABNORMAL LOW (ref 4.22–5.81)
RDW: 15.1 % (ref 11.5–15.5)
WBC: 20.8 10*3/uL — ABNORMAL HIGH (ref 4.0–10.5)
nRBC: 0 % (ref 0.0–0.2)

## 2020-06-11 LAB — ECHOCARDIOGRAM COMPLETE
Height: 73 in
S' Lateral: 2.52 cm
Weight: 3077.97 oz

## 2020-06-11 LAB — LEGIONELLA PNEUMOPHILA SEROGP 1 UR AG: L. pneumophila Serogp 1 Ur Ag: NEGATIVE

## 2020-06-11 LAB — PROCALCITONIN: Procalcitonin: 1.05 ng/mL

## 2020-06-11 LAB — PHOSPHORUS: Phosphorus: 2.1 mg/dL — ABNORMAL LOW (ref 2.5–4.6)

## 2020-06-11 LAB — MRSA PCR SCREENING: MRSA by PCR: NEGATIVE

## 2020-06-11 LAB — MAGNESIUM: Magnesium: 1.5 mg/dL — ABNORMAL LOW (ref 1.7–2.4)

## 2020-06-11 MED ORDER — MAGNESIUM SULFATE 2 GM/50ML IV SOLN
2.0000 g | Freq: Once | INTRAVENOUS | Status: AC
  Administered 2020-06-11: 2 g via INTRAVENOUS
  Filled 2020-06-11: qty 50

## 2020-06-11 MED ORDER — PREDNISONE 20 MG PO TABS
40.0000 mg | ORAL_TABLET | Freq: Every day | ORAL | Status: DC
  Administered 2020-06-12: 40 mg via ORAL
  Filled 2020-06-11: qty 2

## 2020-06-11 MED ORDER — SODIUM CHLORIDE 1 G PO TABS
1.0000 g | ORAL_TABLET | Freq: Two times a day (BID) | ORAL | Status: AC
  Administered 2020-06-11 – 2020-06-13 (×6): 1 g via ORAL
  Filled 2020-06-11 (×6): qty 1

## 2020-06-11 MED ORDER — FUROSEMIDE 10 MG/ML IJ SOLN
20.0000 mg | Freq: Every day | INTRAMUSCULAR | Status: DC
  Administered 2020-06-11 – 2020-06-16 (×6): 20 mg via INTRAVENOUS
  Filled 2020-06-11 (×6): qty 2

## 2020-06-11 MED ORDER — SODIUM CHLORIDE 0.9 % IV SOLN
2.0000 g | INTRAVENOUS | Status: DC
  Administered 2020-06-11 – 2020-06-12 (×2): 2 g via INTRAVENOUS
  Filled 2020-06-11: qty 20
  Filled 2020-06-11 (×2): qty 2

## 2020-06-11 NOTE — Progress Notes (Signed)
   06/11/20 0801  Assess: MEWS Score  Temp 99.5 F (37.5 C)  BP 126/69  Pulse Rate (!) 116  Resp 20  SpO2 92 %  O2 Device Nasal Cannula  O2 Flow Rate (L/min) 4 L/min  Assess: MEWS Score  MEWS Temp 0  MEWS Systolic 0  MEWS Pulse 2  MEWS RR 0  MEWS LOC 0  MEWS Score 2  MEWS Score Color Yellow  Assess: if the MEWS score is Yellow or Red  Were vital signs taken at a resting state? Yes  Focused Assessment No change from prior assessment  Early Detection of Sepsis Score *See Row Information* Low  MEWS guidelines implemented *See Row Information* Yes  Treat  MEWS Interventions Administered scheduled meds/treatments (RT admin breahtin tx, given tylenol for pain)  Pain Scale 0-10  Pain Score 7  Pain Type Acute pain  Pain Location Head  Pain Descriptors / Indicators Aching  Pain Intervention(s) Pain med given for lower pain score than stated, per patient request  Take Vital Signs  Increase Vital Sign Frequency  Yellow: Q 2hr X 2 then Q 4hr X 2, if remains yellow, continue Q 4hrs  Escalate  MEWS: Escalate Yellow: discuss with charge nurse/RN and consider discussing with provider and RRT  Notify: Charge Nurse/RN  Name of Charge Nurse/RN Notified Linard Millers  Date Charge Nurse/RN Notified 06/11/20  Time Charge Nurse/RN Notified [phone]    Patient given tylenol and duoneb treatment. See MAR. Patient denies further needs at this time.

## 2020-06-11 NOTE — Care Management Important Message (Signed)
Important Message  Patient Details  Name: Jeremiah [name] MRN: [id number] Date of Birth: Sep 16, 1950   Medicare Important Message Given:  Yes     Dannette Barbara 06/11/2020, 4:40 PM

## 2020-06-11 NOTE — Consult Note (Signed)
Pulmonary Medicine          Date: 06/11/2020,   MRN# [id number] Jeremiah [name] 1950/12/02     AdmissionWeight: 87.5 kg                 CurrentWeight: 87.3 kg   Referring physician: Dr Dwyane Dee   CHIEF COMPLAINT:   Multifocal pneumonia with acute on chronic hypoxemic respiratory failure.    HISTORY OF PRESENT ILLNESS   As per admission h/p Jeremiah [name] is a 70 y.o. male with medical history significant of COPD on home oxygen at 2 L with, HTN, tobacco abuse, hyponatremia, GERD, history of recent Covid infection.  Presented with cough and shortness of breath For the past 2 to 3 days have been having cough productive of thick sputum Noted to have elevated white blood cell count and worsening hypoxia requiring up to 6 L to maintain oxygen saturation 91%  Patient tested Covid positive about 1 month ago Last month was admitted with sepsis secondary to right foot cellulitis.  Was treated with IV vancomycin and meropenem.  Patient was found to be Covid positive on 7 January. Found to have Covid pneumonia treated for remdesivir steroids bronchodilators and completed his treatment while hospitalized.  He was discharged to SNF. Serial CXR reviewed by me shows flattening of hemidiaphragms bilaterally with interstitial opacification suugestive of chronic lung disease with component of pulmonary edema.    PAST MEDICAL HISTORY   Past Medical History:  Diagnosis Date  . Asthma   . COPD (chronic obstructive pulmonary disease) (HCC)    O2 dependent - 2L  . GERD (gastroesophageal reflux disease)   . HTN (hypertension)   . Oxygen dependent   . Tobacco dependence      SURGICAL HISTORY   Past Surgical History:  Procedure Laterality Date  . CATARACT EXTRACTION W/PHACO Left 12/09/2019   Procedure: CATARACT EXTRACTION PHACO AND INTRAOCULAR LENS PLACEMENT ([facility]) COMPLICATED LEFT 10.17 51:02.5;  Surgeon: Birder Robson, MD;  Location: [facility];  Service:  Ophthalmology;  Laterality: Left;  [place]  . CATARACT EXTRACTION W/PHACO Right 01/27/2020   Procedure: CATARACT EXTRACTION PHACO AND INTRAOCULAR LENS PLACEMENT ([place]) RIGHT VISION BLUE 26.83 02:07.6;  Surgeon: Birder Robson, MD;  Location: [facility];  Service: Ophthalmology;  Laterality: Right;  . HERNIA REPAIR    . NECK SURGERY       FAMILY HISTORY   Family History  Problem Relation Age of Onset  . Cancer - Colon Father   . Congestive Heart Failure Mother      SOCIAL HISTORY   Social History   Tobacco Use  . Smoking status: Current Every Day Smoker    Packs/day: 0.50    Years: 50.00    Pack years: 25.00    Types: Cigarettes    Last attempt to quit: 07/28/2015    Years since quitting: 4.8  . Smokeless tobacco: Never Used  . Tobacco comment: started age 61.  Vaping Use  . Vaping Use: Never used  Substance Use Topics  . Alcohol use: Yes    Alcohol/week: 0.0 standard drinks    Comment: beers occasionally  . Drug use: No     MEDICATIONS    Home Medication:    Current Medication:  Current Facility-Administered Medications:  .  acetaminophen (TYLENOL) tablet 650 mg, 650 mg, Oral, Q6H PRN, 650 mg at 06/11/20 0814 **OR** acetaminophen (TYLENOL) suppository 650 mg, 650 mg, Rectal, Q6H PRN, Doutova, Anastassia, MD .  albuterol (PROVENTIL) (2.5  MG/3ML) 0.083% nebulizer solution 2.5 mg, 2.5 mg, Nebulization, Q2H PRN, Doutova, Anastassia, MD .  albuterol (VENTOLIN HFA) 108 (90 Base) MCG/ACT inhaler 2 puff, 2 puff, Inhalation, Q4H PRN, Doutova, Anastassia, MD .  atorvastatin (LIPITOR) tablet 40 mg, 40 mg, Oral, Daily, Doutova, Anastassia, MD, 40 mg at 06/11/20 0813 .  azithromycin (ZITHROMAX) 500 mg in sodium chloride 0.9 % 250 mL IVPB, 500 mg, Intravenous, Q24H, Doutova, Anastassia, MD, Last Rate: 250 mL/hr at 06/10/20 2330, 500 mg at 06/10/20 2330 .  aztreonam (AZACTAM) 2 g in sodium chloride 0.9 % 100 mL IVPB, 2 g, Intravenous, Q8H, Doutova,  Anastassia, MD, Last Rate: 200 mL/hr at 06/11/20 0550, 2 g at 06/11/20 0550 .  enoxaparin (LOVENOX) injection 40 mg, 40 mg, Subcutaneous, Q24H, Doutova, Anastassia, MD, 40 mg at 06/11/20 0812 .  guaiFENesin (MUCINEX) 12 hr tablet 600 mg, 600 mg, Oral, BID, Doutova, Anastassia, MD, 600 mg at 06/11/20 0813 .  ipratropium-albuterol (DUONEB) 0.5-2.5 (3) MG/3ML nebulizer solution 3 mL, 3 mL, Nebulization, Q4H, Doutova, Anastassia, MD, 3 mL at 06/11/20 1120 .  montelukast (SINGULAIR) tablet 10 mg, 10 mg, Oral, QHS, Doutova, Anastassia, MD, 10 mg at 06/10/20 2220 .  nicotine (NICODERM CQ - dosed in mg/24 hours) patch 21 mg, 21 mg, Transdermal, Daily, Doutova, Anastassia, MD, 21 mg at 06/11/20 0813 .  pantoprazole (PROTONIX) EC tablet 20 mg, 20 mg, Oral, Daily, Doutova, Anastassia, MD, 20 mg at 06/11/20 0814 .  sodium chloride tablet 1 g, 1 g, Oral, BID WC, Shawna Clamp, MD, 1 g at 06/11/20 [phone] .  vancomycin (VANCOREADY) IVPB 1250 mg/250 mL, 1,250 mg, Intravenous, Q12H, Doutova, Anastassia, MD, Last Rate: 166.7 mL/hr at 06/11/20 1233, 1,250 mg at 06/11/20 1233    ALLERGIES   Penicillins     REVIEW OF SYSTEMS    Review of Systems:  Gen:  Denies  fever, sweats, chills weigh loss  HEENT: Denies blurred vision, double vision, ear pain, eye pain, hearing loss, nose bleeds, sore throat Cardiac:  No dizziness, chest pain or heaviness, chest tightness,edema Resp:  Complains of cough rpductive of phlegm Gi: Denies swallowing difficulty, stomach pain, nausea or vomiting, diarrhea, constipation, bowel incontinence Gu:  Denies bladder incontinence, burning urine Ext:   Denies Joint pain, stiffness or swelling Skin: Denies  skin rash, easy bruising or bleeding or hives Endoc:  Denies polyuria, polydipsia , polyphagia or weight change Psych:   Denies depression, insomnia or hallucinations   Other:  All other systems negative   VS: BP 120/67 (BP Location: Left Arm)   Pulse (!) 110   Temp 100 F  (37.8 C) (Oral)   Resp 20   Ht 6' 1"  (1.854 m)   Wt 87.3 kg   SpO2 96%   BMI 25.38 kg/m      PHYSICAL EXAM    GENERAL:NAD, no fevers, chills, no weakness no fatigue HEAD: Normocephalic, atraumatic.  EYES: Pupils equal, round, reactive to light. Extraocular muscles intact. No scleral icterus.  MOUTH: Moist mucosal membrane. Dentition intact. No abscess noted.  EAR, NOSE, THROAT: Clear without exudates. No external lesions.  NECK: Supple. No thyromegaly. No nodules. No JVD.  PULMONARY: bilateral rhochi CARDIOVASCULAR: S1 and S2. Regular rate and rhythm. No murmurs, rubs, or gallops. No edema. Pedal pulses 2+ bilaterally.  GASTROINTESTINAL: Soft, nontender, nondistended. No masses. Positive bowel sounds. No hepatosplenomegaly.  MUSCULOSKELETAL: No swelling, clubbing, or edema. Range of motion full in all extremities.  NEUROLOGIC: Cranial nerves II through XII are intact. No gross focal neurological deficits.  Sensation intact. Reflexes intact.  SKIN: No ulceration, lesions, rashes, or cyanosis. Skin warm and dry. Turgor intact.  PSYCHIATRIC: Mood, affect within normal limits. The patient is awake, alert and oriented x 3. Insight, judgment intact.       IMAGING    DG Chest 2 View  Result Date: 05/14/2020 CLINICAL DATA:  Cough and shortness of breath EXAM: CHEST - 2 VIEW COMPARISON:  6/10/8 FINDINGS: Cardiac shadow is within normal limits. Aortic calcifications are again identified. Diffuse interstitial scarring and fibrosis is noted stable from the prior exam. Lungs are again hyperinflated. No acute bony abnormality is seen. IMPRESSION: COPD and chronic fibrotic changes.  No acute abnormality noted. Electronically Signed   By: Inez Catalina M.D.   On: 05/14/2020 16:43   MR FOOT RIGHT WO CONTRAST  Result Date: 05/16/2020 CLINICAL DATA:  Right foot swelling and redness. EXAM: MRI OF THE RIGHT FOREFOOT WITHOUT CONTRAST TECHNIQUE: Multiplanar, multisequence MR imaging of the right foot  was performed. No intravenous contrast was administered. COMPARISON:  Right foot x-rays dated May 14, 2020. FINDINGS: Bones/Joint/Cartilage No marrow signal abnormality. No acute fracture or dislocation. Old healed fracture of the base of the fifth metatarsal. Hallux valgus deformity. Mild first MTP joint osteoarthritis with small joint effusion. Mild midfoot osteoarthritis. Ligaments Collateral ligaments are intact. Muscles and Tendons Flexor and extensor tendons are intact. No tenosynovitis. Diffuse fatty atrophy of the intrinsic foot muscles. Soft tissue Diffuse soft tissue swelling. No fluid collection or hematoma. No soft tissue mass. IMPRESSION: 1. Diffuse soft tissue swelling, likely reflecting cellulitis. No abscess or osteomyelitis. Electronically Signed   By: Titus Dubin M.D.   On: 05/16/2020 09:21   DG Chest Portable 1 View  Result Date: 06/09/2020 CLINICAL DATA:  Shortness of breath EXAM: PORTABLE CHEST 1 VIEW COMPARISON:  May 14, 2020 FINDINGS: The heart size and mediastinal contours are mildly enlarged. Aortic knob calcifications are seen. Multifocal patchy interstitial opacities are seen throughout lower lungs, right greater than left. Biapical scarring is noted. No acute osseous abnormality. IMPRESSION: Multifocal airspace opacities, right greater than left, likely consistent with multifocal pneumonia Electronically Signed   By: Prudencio Pair M.D.   On: 06/09/2020 21:26   DG Foot Complete Right  Result Date: 05/14/2020 CLINICAL DATA:  Concern for possible osteomyelitis. Bilateral foot swelling and redness. The top of the right foot is black. Ulcer to the bottom of the heel. EXAM: RIGHT FOOT COMPLETE - 3+ VIEW COMPARISON:  None. FINDINGS: There is a hallux valgus deformity. No soft tissue gas identified. No bony erosion to suggest osteomyelitis. Deformity of the fifth metatarsal base is likely sequela of previous trauma. IMPRESSION: 1. Deformity of the fifth metatarsal base is likely  due to previous trauma. No evidence of osteomyelitis on this study. MRI would be more sensitive for osteomyelitis. Electronically Signed   By: Dorise Bullion III M.D   On: 05/14/2020 20:04   ECHOCARDIOGRAM COMPLETE  Result Date: 06/11/2020    ECHOCARDIOGRAM REPORT   Patient Name:   [facility] Date of Exam: 06/11/2020 Medical Rec #:  [id number]           Height:       73.0 in Accession #:    [id number]          Weight:       192.4 lb Date of Birth:  1950/09/14           BSA:          2.116 m Patient Age:  69 years            BP:           109/55 mmHg Patient Gender: M                   HR:           114 bpm. Exam Location:  [facility] Procedure: 2D Echo, Cardiac Doppler and Color Doppler Indications:     Elevated troponin  History:         Patient has prior history of Echocardiogram examinations, most                  recent 08/04/2015. COPD; Risk Factors:Hypertension. Tobacco                  dependence.  Sonographer:     Sherrie Sport RDCS (AE) Referring Phys:  Geryl Rankins DOUTOVA Diagnosing Phys: Ida Rogue MD  Sonographer Comments: Technically difficult study due to poor echo windows, no apical window and no subcostal window. Image acquisition challenging due to COPD. IMPRESSIONS  1. Left ventricular ejection fraction, by estimation, is 60 to 65%. The left ventricle has normal function. The left ventricle has no regional wall motion abnormalities. Left ventricular diastolic parameters are indeterminate.  2. Right ventricular systolic function is normal. The right ventricular size is normal.  3. Challenging images FINDINGS  Left Ventricle: Left ventricular ejection fraction, by estimation, is 60 to 65%. The left ventricle has normal function. The left ventricle has no regional wall motion abnormalities. The left ventricular internal cavity size was normal in size. There is  no left ventricular hypertrophy. Left ventricular diastolic parameters are indeterminate. Right Ventricle: The right ventricular  size is normal. No increase in right ventricular wall thickness. Right ventricular systolic function is normal. Left Atrium: Left atrial size was normal in size. Right Atrium: Right atrial size was normal in size. Pericardium: There is no evidence of pericardial effusion. Mitral Valve: The mitral valve is normal in structure. There is mild thickening of the mitral valve leaflet(s). No evidence of mitral valve regurgitation. No evidence of mitral valve stenosis. Tricuspid Valve: The tricuspid valve is normal in structure. Tricuspid valve regurgitation is not demonstrated. No evidence of tricuspid stenosis. Aortic Valve: The aortic valve was not well visualized. Aortic valve regurgitation is not visualized. Mild to moderate aortic valve sclerosis/calcification is present, without any evidence of aortic stenosis. Pulmonic Valve: The pulmonic valve was normal in structure. Pulmonic valve regurgitation is not visualized. No evidence of pulmonic stenosis. Aorta: The aortic root is normal in size and structure. Venous: The inferior vena cava is normal in size with greater than 50% respiratory variability, suggesting right atrial pressure of 3 mmHg. IAS/Shunts: No atrial level shunt detected by color flow Doppler.  LEFT VENTRICLE PLAX 2D LVIDd:         4.38 cm LVIDs:         2.52 cm LV PW:         1.12 cm LV IVS:        1.24 cm LVOT diam:     2.00 cm LVOT Area:     3.14 cm  LEFT ATRIUM         Index LA diam:    4.00 cm 1.89 cm/m                        PULMONIC VALVE AORTA  PV Vmax:        0.95 m/s Ao Root diam: 3.60 cm PV Peak grad:   3.6 mmHg                       RVOT Peak grad: 5 mmHg   SHUNTS Systemic Diam: 2.00 cm Ida Rogue MD Electronically signed by Ida Rogue MD Signature Date/Time: 06/11/2020/1:29:30 PM    Final       ASSESSMENT/PLAN    Acute on chronic hypoxemic respiratory failure - present on admission  - COVID19 negative   - supplemental O2 during my evaluation 6L/min  - will  perform infectious workup for pneumonia -Respiratory viral panel -serum fungitell -legionella ab-negative -strep pneumoniae ur AG-negative -Histoplasma Ur Ag -sputum resp cultures-GPCs in clusters -suggesitive of staph species - MRSA pcr-negative -AFB sputum expectorated specimen -sputum cytology  -reviewed pertinent imaging with patient today - ESR/CRP -PT/OT for d/c planning  -please encourage patient to use incentive spirometer few times each hour while hospitalized.   -procalcitonin is mildly elevated - will trend -agree with zithromax and rocephin for CAP regimen at this time -wean FiO2 as able patient is generally on 2L/min [place] at home -s/p TTE - diastology could not be assessed due to poor windows, will obtain BNP today as since there is interstitial edema on current CXR.   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   Patient/Family are satisfied with care plan and all questions have been answered.  This document was prepared using Dragon voice recognition software and may include unintentional dictation errors.     Ottie Glazier, M.D.  Division of [place]

## 2020-06-11 NOTE — Progress Notes (Signed)
Patient O2 increased to 6L to maintain O2 >94% . Patient heart rate in 100s-low120s with occasional PVCs. Patient demonstrates increased work of breathing while sitting on bedside. Placed on continuous pulse ox so RN can monitor. Patient educated on IS and coughing and deep breathing. Patient denies further needs at this time. MD secure chatted with update.

## 2020-06-11 NOTE — Progress Notes (Signed)
*  PRELIMINARY RESULTS* Echocardiogram 2D Echocardiogram has been performed.  Jeremiah [name] 06/11/2020, 7:59 AM

## 2020-06-11 NOTE — Progress Notes (Signed)
PROGRESS NOTE    Jeremiah [name]  IOE:[id number] DOB: [birth year] DOA: 06/09/2020 PCP: System, Provider Not In   Brief Narrative:  This patient is 70 years old male with PMH significant for COPD on home oxygen at 2 L, hypertension, tobacco abuse, hyponatremia, GERD, history of recent Covid infection presents in the emergency department with productive cough and shortness of breath.  He is found to be hypoxic,  requiring 6 L of oxygen to maintain saturation above 91%, found to have leukocytosis.  He was hospitalized last month for Covid pneumonia and was treated with remdesivir, steroids bronchodilators and has completed treatment while hospitalized. Patient is admitted for sepsis secondary to multifocal pneumonia and acute on chronic hypoxic respiratory failure requiring 6 L of oxygen.  Assessment & Plan:   Active Problems:   Acute on chronic respiratory failure with hypoxia (HCC)   Tobacco dependency   COPD with chronic bronchitis (HCC)   Hyponatremia   Sepsis ([place])   HTN (hypertension)   Severe sepsis secondary to multifocal pneumonia. Patient was found to be tachycardic, tachypneic, leucocytosis, hypoxic with chest x-ray shows infiltrate.  Procalcitonin 1.02, lactic acid 1.4. Initiated on empirical antibiotics (vancomycin , aztreonam and Zithromax.) Blood cultures no growth so far .Descalate antibiotics after cultures back. MRSA nares negative.  Vancomycin discontinued. Continue ceftriaxone and Zithromax. Patient was well hydrated in the ER. monitor wbc trend.  WBC trending down. Sepsis physiology improving  Acute on chronic hypoxic respiratory failure: Most likely secondary to bacterial superinfection with previous history of Covid. Continue supplemental oxygen to keep saturation above 94%. Continue steroids and nebulization. Pulmonology consulted,  awaiting recommendation. Patient has completed remdesivir treatment on previous admission. He is weaned down to 4 L/ min Via  [place]. Marland Kitchen Hypertension: Resume home medications.  Continue Cardizem and lisinopril but hold HCTZ  COPD: Continue albuterol as needed. Continue supplemental oxygen.   Chronic hyponatremia: Differential diagnosis pneumonia,, hydrochlorothiazide and dehydration. Continue IV hydration,  hold HCTZ. Obtain urine electrolytes Recheck a.m. labs  Hyperlipidemia: Continue Lipitor  Elevated troponin :  could be due to demand ischemia from sepsis. Troponin slightly trending up. Consider cardiology if troponin continue to trend up Patient denies any chest, dizziness, palpitations  DVT prophylaxis:  Lovenox Code Status: Full code. Family Communication:  No family at bed side. Disposition Plan:   Status is: Inpatient  Remains inpatient appropriate because:Inpatient level of care appropriate due to severity of illness   Dispo: The patient is from: Home              Anticipated d/c is to: Home              Anticipated d/c date is: > 3 days              Patient currently is not medically stable to d/c.   Difficult to place patient No    Consultants:   None  Procedures:  Antimicrobials:   Anti-infectives (From admission, onward)   Start     Dose/Rate Route Frequency Ordered Stop   06/10/20 2200  azithromycin (ZITHROMAX) 500 mg in sodium chloride 0.9 % 250 mL IVPB        500 mg 250 mL/hr over 60 Minutes Intravenous Every 24 hours 06/09/20 2300     06/10/20 1200  vancomycin (VANCOREADY) IVPB 1250 mg/250 mL        1,250 mg 166.7 mL/hr over 90 Minutes Intravenous Every 12 hours 06/09/20 2323     06/10/20 0600  aztreonam (AZACTAM) 2 g in  sodium chloride 0.9 % 100 mL IVPB        2 g 200 mL/hr over 30 Minutes Intravenous Every 8 hours 06/09/20 2319     06/09/20 2330  vancomycin (VANCOREADY) IVPB 2000 mg/400 mL        2,000 mg 200 mL/hr over 120 Minutes Intravenous  Once 06/09/20 2320 06/10/20 0302   06/09/20 2230  vancomycin (VANCOCIN) IVPB 1000 mg/200 mL premix  Status:   Discontinued        1,000 mg 200 mL/hr over 60 Minutes Intravenous  Once 06/09/20 2217 06/09/20 2320   06/09/20 2200  levofloxacin (LEVAQUIN) IVPB 750 mg        750 mg 100 mL/hr over 90 Minutes Intravenous  Once 06/09/20 2148 06/09/20 2346     Subjective: Patient was seen and examined at bedside.  Overnight events noted.   Patient reports feeling slightly better, he still reports having cough and shortness of breath with exertion.  Objective: Vitals:   06/11/20 0801 06/11/20 0809 06/11/20 1010 06/11/20 1128  BP: 126/69  112/71 120/67  Pulse: (!) 116  (!) 124 (!) 110  Resp: 20  (!) 24 20  Temp: 99.5 F (37.5 C)  98.2 F (36.8 C) 100 F (37.8 C)  TempSrc: Oral  Oral Oral  SpO2: 92% 92% 92% 96%  Weight:      Height:        Intake/Output Summary (Last 24 hours) at 06/11/2020 1502 Last data filed at 06/11/2020 1439 Gross per 24 hour  Intake 1320 ml  Output 1925 ml  Net -605 ml   Filed Weights   06/09/20 2101 06/11/20 0346  Weight: 87.5 kg 87.3 kg    Examination:  General exam: Appears calm and comfortable, not in any acute distress. Respiratory system: Clear to auscultation. Respiratory effort normal. Cardiovascular system: S1 & S2 heard, RRR. No JVD, murmurs, rubs, gallops or clicks. No pedal edema. Gastrointestinal system: Abdomen is nondistended, soft and nontender. No organomegaly or masses felt. Normal bowel sounds heard. Central nervous system: Alert and oriented. No focal neurological deficits. Extremities: Symmetric 5 x 5 power. No edema, no cyanosis, no clubbing Skin: No rashes, lesions or ulcers Psychiatry: Judgement and insight appear normal. Mood & affect appropriate.     Data Reviewed: I have personally reviewed following labs and imaging studies  CBC: Recent Labs  Lab 06/09/20 2110 06/10/20 0518 06/11/20 0541  WBC 31.1* 29.2* 20.8*  NEUTROABS  --  27.3*  --   HGB 10.4* 10.5* 10.6*  HCT 30.5* 31.1* 30.4*  MCV 86.6 87.4 87.1  PLT 369 372 027    Basic Metabolic Panel: Recent Labs  Lab 06/09/20 2110 06/10/20 0518 06/11/20 0541  NA 123* 125* 123*  K 4.2 4.1 3.8  CL 88* 93* 93*  CO2 24 24 23   GLUCOSE 109* 142* 96  BUN 20 17 19   CREATININE 0.88 0.62 0.55*  CALCIUM 8.6* 8.6* 8.6*  MG  --  1.6* 1.5*  PHOS  --  2.6 2.1*   GFR: Estimated Creatinine Clearance: 98.5 mL/min (A) (by C-G formula based on SCr of 0.55 mg/dL (L)). Liver Function Tests: Recent Labs  Lab 06/09/20 2110 06/10/20 0518 06/11/20 0541  AST 24 19 33  ALT 21 22 36  ALKPHOS 122 120 134*  BILITOT 1.1 0.7 0.7  PROT 6.7 7.1 6.3*  ALBUMIN 3.2* 3.2* 2.8*   No results for input(s): LIPASE, AMYLASE in the last 168 hours. No results for input(s): AMMONIA in the last 168 hours. Coagulation  Profile: Recent Labs  Lab 06/09/20 2326  INR 1.4*   Cardiac Enzymes: Recent Labs  Lab 06/10/20 0518  CKTOTAL 74   BNP (last 3 results) No results for input(s): PROBNP in the last 8760 hours. HbA1C: No results for input(s): HGBA1C in the last 72 hours. CBG: No results for input(s): GLUCAP in the last 168 hours. Lipid Profile: No results for input(s): CHOL, HDL, LDLCALC, TRIG, CHOLHDL, LDLDIRECT in the last 72 hours. Thyroid Function Tests: Recent Labs    06/10/20 0518  TSH 1.040   Anemia Panel: Recent Labs    06/10/20 0518  FERRITIN 219   Sepsis Labs: Recent Labs  Lab 06/09/20 2156 06/09/20 2326 06/10/20 0518 06/11/20 0541  PROCALCITON  --  0.84 1.02 1.05  LATICACIDVEN 1.4  --   --   --     Recent Results (from the past 240 hour(s))  SARS Coronavirus 2 by RT PCR (hospital order, performed in [place] hospital lab) Nasopharyngeal Nasopharyngeal Swab     Status: None   Collection Time: 06/09/20  9:02 PM   Specimen: Nasopharyngeal Swab  Result Value Ref Range Status   SARS Coronavirus 2 NEGATIVE NEGATIVE Final    Comment: (NOTE) SARS-CoV-2 target nucleic acids are NOT DETECTED.  The SARS-CoV-2 RNA is generally detectable in upper and  lower respiratory specimens during the acute phase of infection. The lowest concentration of SARS-CoV-2 viral copies this assay can detect is 250 copies / mL. A negative result does not preclude SARS-CoV-2 infection and should not be used as the sole basis for treatment or other patient management decisions.  A negative result may occur with improper specimen collection / handling, submission of specimen other than nasopharyngeal swab, presence of viral mutation(s) within the areas targeted by this assay, and inadequate number of viral copies (<250 copies / mL). A negative result must be combined with clinical observations, patient history, and epidemiological information.  Fact Sheet for Patients:   StrictlyIdeas.no  Fact Sheet for Healthcare Providers: BankingDealers.co.za  This test is not yet approved or  cleared by the [place] FDA and has been authorized for detection and/or diagnosis of SARS-CoV-2 by FDA under an Emergency Use Authorization (EUA).  This EUA will remain in effect (meaning this test can be used) for the duration of the COVID-19 declaration under Section 564(b)(1) of the Act, 21 U.S.C. section 360bbb-3(b)(1), unless the authorization is terminated or revoked sooner.  Performed at [facility], [place] [place], [place] [address]   Blood culture (routine x 2)     Status: None (Preliminary result)   Collection Time: 06/09/20  9:50 PM   Specimen: BLOOD  Result Value Ref Range Status   Specimen Description BLOOD LEFT ANTECUBITAL  Final   Special Requests   Final    BOTTLES DRAWN AEROBIC AND ANAEROBIC Blood Culture results may not be optimal due to an inadequate volume of blood received in culture bottles   Culture   Final    NO GROWTH 2 DAYS Performed at [facility], [address] [place], [place] [address]    Report Status PENDING  Incomplete  Blood culture (routine x 2)      Status: None (Preliminary result)   Collection Time: 06/09/20  9:56 PM   Specimen: BLOOD  Result Value Ref Range Status   Specimen Description BLOOD RIGHT ANTECUBITAL  Final   Special Requests   Final    BOTTLES DRAWN AEROBIC AND ANAEROBIC Blood Culture results may not be optimal due  to an inadequate volume of blood received in culture bottles   Culture   Final    NO GROWTH 2 DAYS Performed at [facility], [address] [place], [place] [address]    Report Status PENDING  Incomplete  Culture, sputum-assessment     Status: None   Collection Time: 06/10/20  1:00 PM   Specimen: Expectorated Sputum  Result Value Ref Range Status   Specimen Description EXPECTORATED SPUTUM  Final   Special Requests NONE  Final   Sputum evaluation   Final    THIS SPECIMEN IS ACCEPTABLE FOR SPUTUM CULTURE Performed at [facility], [address], [place], [place] [address]    Report Status 06/10/2020 FINAL  Final  Culture, respiratory     Status: None (Preliminary result)   Collection Time: 06/10/20  1:00 PM  Result Value Ref Range Status   Specimen Description   Final    EXPECTORATED SPUTUM Performed at [facility], [address] [place], [place] [address]    Special Requests   Final    NONE Reflexed from [id number] Performed at [facility], [address] [place], [place] [address]    Gram Stain   Final    FEW WBC PRESENT,BOTH PMN AND MONONUCLEAR MODERATE GRAM POSITIVE COCCI IN CLUSTERS RARE GRAM NEGATIVE RODS Performed at [facility], [place] [address], [place], [place] [address]    Culture PENDING  Incomplete   Report Status PENDING  Incomplete  MRSA PCR Screening     Status: None   Collection Time: 06/11/20  9:57 AM   Specimen: Nasal Mucosa; Nasopharyngeal  Result Value Ref Range Status   MRSA by PCR NEGATIVE NEGATIVE Final    Comment:        The GeneXpert MRSA Assay (FDA approved for NASAL specimens only), is one component of  a comprehensive MRSA colonization surveillance program. It is not intended to diagnose MRSA infection nor to guide or monitor treatment for MRSA infections. Performed at [facility], [address], [place], [place] [address]          Radiology Studies: DG Chest Portable 1 View  Result Date: 06/09/2020 CLINICAL DATA:  Shortness of breath EXAM: PORTABLE CHEST 1 VIEW COMPARISON:  May 14, 2020 FINDINGS: The heart size and mediastinal contours are mildly enlarged. Aortic knob calcifications are seen. Multifocal patchy interstitial opacities are seen throughout lower lungs, right greater than left. Biapical scarring is noted. No acute osseous abnormality. IMPRESSION: Multifocal airspace opacities, right greater than left, likely consistent with multifocal pneumonia Electronically Signed   By: Prudencio Pair M.D.   On: 06/09/2020 21:26   ECHOCARDIOGRAM COMPLETE  Result Date: 06/11/2020    ECHOCARDIOGRAM REPORT   Patient Name:   MONTRAIL MEHRER Date of Exam: 06/11/2020 Medical Rec #:  [id number]           Height:       73.0 in Accession #:    [id number]          Weight:       192.4 lb Date of Birth:  September 04, 1950           BSA:          2.116 m Patient Age:    76 years            BP:           109/55 mmHg Patient Gender: M  HR:           114 bpm. Exam Location:  [facility] Procedure: 2D Echo, Cardiac Doppler and Color Doppler Indications:     Elevated troponin  History:         Patient has prior history of Echocardiogram examinations, most                  recent 08/04/2015. COPD; Risk Factors:Hypertension. Tobacco                  dependence.  Sonographer:     Sherrie Sport RDCS (AE) Referring Phys:  Geryl Rankins DOUTOVA Diagnosing Phys: Ida Rogue MD  Sonographer Comments: Technically difficult study due to poor echo windows, no apical window and no subcostal window. Image acquisition challenging due to COPD. IMPRESSIONS  1. Left ventricular ejection fraction, by estimation,  is 60 to 65%. The left ventricle has normal function. The left ventricle has no regional wall motion abnormalities. Left ventricular diastolic parameters are indeterminate.  2. Right ventricular systolic function is normal. The right ventricular size is normal.  3. Challenging images FINDINGS  Left Ventricle: Left ventricular ejection fraction, by estimation, is 60 to 65%. The left ventricle has normal function. The left ventricle has no regional wall motion abnormalities. The left ventricular internal cavity size was normal in size. There is  no left ventricular hypertrophy. Left ventricular diastolic parameters are indeterminate. Right Ventricle: The right ventricular size is normal. No increase in right ventricular wall thickness. Right ventricular systolic function is normal. Left Atrium: Left atrial size was normal in size. Right Atrium: Right atrial size was normal in size. Pericardium: There is no evidence of pericardial effusion. Mitral Valve: The mitral valve is normal in structure. There is mild thickening of the mitral valve leaflet(s). No evidence of mitral valve regurgitation. No evidence of mitral valve stenosis. Tricuspid Valve: The tricuspid valve is normal in structure. Tricuspid valve regurgitation is not demonstrated. No evidence of tricuspid stenosis. Aortic Valve: The aortic valve was not well visualized. Aortic valve regurgitation is not visualized. Mild to moderate aortic valve sclerosis/calcification is present, without any evidence of aortic stenosis. Pulmonic Valve: The pulmonic valve was normal in structure. Pulmonic valve regurgitation is not visualized. No evidence of pulmonic stenosis. Aorta: The aortic root is normal in size and structure. Venous: The inferior vena cava is normal in size with greater than 50% respiratory variability, suggesting right atrial pressure of 3 mmHg. IAS/Shunts: No atrial level shunt detected by color flow Doppler.  LEFT VENTRICLE PLAX 2D LVIDd:         4.38  cm LVIDs:         2.52 cm LV PW:         1.12 cm LV IVS:        1.24 cm LVOT diam:     2.00 cm LVOT Area:     3.14 cm  LEFT ATRIUM         Index LA diam:    4.00 cm 1.89 cm/m                        PULMONIC VALVE AORTA                 PV Vmax:        0.95 m/s Ao Root diam: 3.60 cm PV Peak grad:   3.6 mmHg  RVOT Peak grad: 5 mmHg   SHUNTS Systemic Diam: 2.00 cm Ida Rogue MD Electronically signed by Ida Rogue MD Signature Date/Time: 06/11/2020/1:29:30 PM    Final     Scheduled Meds: . atorvastatin  40 mg Oral Daily  . enoxaparin (LOVENOX) injection  40 mg Subcutaneous Q24H  . guaiFENesin  600 mg Oral BID  . ipratropium-albuterol  3 mL Nebulization Q4H  . montelukast  10 mg Oral QHS  . nicotine  21 mg Transdermal Daily  . pantoprazole  20 mg Oral Daily  . sodium chloride  1 g Oral BID WC   Continuous Infusions: . azithromycin 500 mg (06/10/20 2330)  . aztreonam 2 g (06/11/20 1437)  . vancomycin 1,250 mg (06/11/20 1233)     LOS: 2 days    Time spent: 25 mins    Thomos Domine, MD [facility]   If 7PM-7AM, please contact night-coverage

## 2020-06-12 DIAGNOSIS — J9621 Acute and chronic respiratory failure with hypoxia: Secondary | ICD-10-CM | POA: Diagnosis not present

## 2020-06-12 LAB — RESPIRATORY PANEL BY PCR

## 2020-06-12 LAB — BASIC METABOLIC PANEL
Anion gap: 8 (ref 5–15)
BUN: 16 mg/dL (ref 8–23)
CO2: 23 mmol/L (ref 22–32)
Calcium: 8.2 mg/dL — ABNORMAL LOW (ref 8.9–10.3)
Chloride: 96 mmol/L — ABNORMAL LOW (ref 98–111)
Creatinine, Ser: 0.5 mg/dL — ABNORMAL LOW (ref 0.61–1.24)
GFR, Estimated: >60 mL/min (ref >60)
Glucose, Bld: 93 mg/dL (ref 70–99)
Potassium: 3.4 mmol/L — ABNORMAL LOW (ref 3.5–5.1)
Sodium: 127 mmol/L — ABNORMAL LOW (ref 135–145)

## 2020-06-12 LAB — CBC
HCT: 29.4 % — ABNORMAL LOW (ref 39.0–52.0)
Hemoglobin: 10 g/dL — ABNORMAL LOW (ref 13.0–17.0)
MCH: 29.6 pg (ref 26.0–34.0)
MCHC: 34 g/dL (ref 30.0–36.0)
MCV: 87 fL (ref 80.0–100.0)
Platelets: 372 10*3/uL (ref 150–400)
RBC: 3.38 MIL/uL — ABNORMAL LOW (ref 4.22–5.81)
RDW: 15 % (ref 11.5–15.5)
WBC: 21.3 10*3/uL — ABNORMAL HIGH (ref 4.0–10.5)
nRBC: 0 % (ref 0.0–0.2)

## 2020-06-12 LAB — MAGNESIUM: Magnesium: 1.6 mg/dL — ABNORMAL LOW (ref 1.7–2.4)

## 2020-06-12 LAB — PHOSPHORUS: Phosphorus: 2.7 mg/dL (ref 2.5–4.6)

## 2020-06-12 LAB — C-REACTIVE PROTEIN: CRP: 25.2 mg/dL — ABNORMAL HIGH (ref <1.0)

## 2020-06-12 MED ORDER — MAGNESIUM SULFATE 2 GM/50ML IV SOLN
2.0000 g | Freq: Once | INTRAVENOUS | Status: AC
  Administered 2020-06-12: 2 g via INTRAVENOUS
  Filled 2020-06-12: qty 50

## 2020-06-12 MED ORDER — POTASSIUM CHLORIDE 20 MEQ PO PACK
40.0000 meq | PACK | Freq: Once | ORAL | Status: AC
  Administered 2020-06-12: 40 meq via ORAL
  Filled 2020-06-12: qty 2

## 2020-06-12 MED ORDER — PREDNISONE 20 MG PO TABS
30.0000 mg | ORAL_TABLET | Freq: Every day | ORAL | Status: DC
  Administered 2020-06-13: 30 mg via ORAL
  Filled 2020-06-12: qty 1

## 2020-06-12 NOTE — Progress Notes (Signed)
Pulmonary Medicine          Date: 06/12/2020,   MRN# [id number] Jeremiah [name] [birth year]     AdmissionWeight: 87.5 kg                 CurrentWeight: 88.4 kg   Referring physician: Dr Dwyane Dee   CHIEF COMPLAINT:   Multifocal pneumonia with acute on chronic hypoxemic respiratory failure.    HISTORY OF PRESENT ILLNESS   As per admission h/p Jeremiah [name] is a 70 y.o. male with medical history significant of COPD on home oxygen at 2 L with, HTN, tobacco abuse, hyponatremia, GERD, history of recent Covid infection.  Presented with cough and shortness of breath For the past 2 to 3 days have been having cough productive of thick sputum Noted to have elevated white blood cell count and worsening hypoxia requiring up to 6 L to maintain oxygen saturation 91%  Patient tested Covid positive about 1 month ago Last month was admitted with sepsis secondary to right foot cellulitis.  Was treated with IV vancomycin and meropenem.  Patient was found to be Covid positive on 7 January. Found to have Covid pneumonia treated for remdesivir steroids bronchodilators and completed his treatment while hospitalized.  He was discharged to SNF. Serial CXR reviewed by me shows flattening of hemidiaphragms bilaterally with interstitial opacification suugestive of chronic lung disease with component of pulmonary edema.    06/12/2020- patient feels better this am, he is weaned to 4L/min [place]. He has PT coming in today and he is excited to get OOB and ambulate. I have encouraged him to try 28f walking today.  From pulmonary perspective can initiate dc planning   PAST MEDICAL HISTORY   Past Medical History:  Diagnosis Date  . Asthma   . COPD (chronic obstructive pulmonary disease) (HCC)    O2 dependent - 2L  . GERD (gastroesophageal reflux disease)   . HTN (hypertension)   . Oxygen dependent   . Tobacco dependence      SURGICAL HISTORY   Past Surgical History:  Procedure Laterality  Date  . CATARACT EXTRACTION W/PHACO Left 12/09/2019   Procedure: CATARACT EXTRACTION PHACO AND INTRAOCULAR LENS PLACEMENT (IOC) COMPLICATED LEFT 140.98011:91.4  Surgeon: PBirder Robson MD;  [facility]  Service: Ophthalmology;  Laterality: Left;  [facility] . CATARACT EXTRACTION W/PHACO Right 01/27/2020   Procedure: CATARACT EXTRACTION PHACO AND INTRAOCULAR LENS PLACEMENT ([place] RIGHT VISION BLUE 26.83 02:07.6;  Surgeon: PBirder Robson MD;  [facility]  Service: Ophthalmology;  Laterality: Right;  . HERNIA REPAIR    . NECK SURGERY       FAMILY HISTORY   Family History  Problem Relation Age of Onset  . Cancer - Colon Father   . Congestive Heart Failure Mother      SOCIAL HISTORY   Social History   Tobacco Use  . Smoking status: Current Every Day Smoker    Packs/day: 0.50    Years: 50.00    Pack years: 25.00    Types: Cigarettes    Last attempt to quit: 07/28/2015    Years since quitting: 4.8  . Smokeless tobacco: Never Used  . Tobacco comment: started age 70  Vaping Use  . Vaping Use: Never used  Substance Use Topics  . Alcohol use: Yes    Alcohol/week: 0.0 standard drinks    Comment: beers occasionally  . Drug use: No     MEDICATIONS    Home Medication:  Current Medication:  Current Facility-Administered Medications:  .  acetaminophen (TYLENOL) tablet 650 mg, 650 mg, Oral, Q6H PRN, 650 mg at 06/12/20 0935 **OR** acetaminophen (TYLENOL) suppository 650 mg, 650 mg, Rectal, Q6H PRN, Doutova, Anastassia, MD .  albuterol (PROVENTIL) (2.5 MG/3ML) 0.083% nebulizer solution 2.5 mg, 2.5 mg, Nebulization, Q2H PRN, Doutova, Anastassia, MD .  albuterol (VENTOLIN HFA) 108 (90 Base) MCG/ACT inhaler 2 puff, 2 puff, Inhalation, Q4H PRN, Doutova, Anastassia, MD .  atorvastatin (LIPITOR) tablet 40 mg, 40 mg, Oral, Daily, Doutova, Anastassia, MD, 40 mg at 06/12/20 0921 .  azithromycin (ZITHROMAX) 500 mg in sodium chloride 0.9 %  250 mL IVPB, 500 mg, Intravenous, Q24H, Doutova, Anastassia, MD, Last Rate: 250 mL/hr at 06/11/20 2300, 500 mg at 06/11/20 2300 .  cefTRIAXone (ROCEPHIN) 2 g in sodium chloride 0.9 % 100 mL IVPB, 2 g, Intravenous, Q24H, Shawna Clamp, MD, Last Rate: 200 mL/hr at 06/11/20 1717, 2 g at 06/11/20 1717 .  enoxaparin (LOVENOX) injection 40 mg, 40 mg, Subcutaneous, Q24H, Doutova, Anastassia, MD, 40 mg at 06/12/20 0923 .  furosemide (LASIX) injection 20 mg, 20 mg, Intravenous, Daily, Ottie Glazier, MD, 20 mg at 06/12/20 0923 .  guaiFENesin (MUCINEX) 12 hr tablet 600 mg, 600 mg, Oral, BID, Doutova, Anastassia, MD, 600 mg at 06/12/20 0922 .  ipratropium-albuterol (DUONEB) 0.5-2.5 (3) MG/3ML nebulizer solution 3 mL, 3 mL, Nebulization, Q4H, Doutova, Anastassia, MD, 3 mL at 06/12/20 1137 .  montelukast (SINGULAIR) tablet 10 mg, 10 mg, Oral, QHS, Doutova, Anastassia, MD, 10 mg at 06/11/20 2300 .  nicotine (NICODERM CQ - dosed in mg/24 hours) patch 21 mg, 21 mg, Transdermal, Daily, Doutova, Anastassia, MD, 21 mg at 06/12/20 0924 .  pantoprazole (PROTONIX) EC tablet 20 mg, 20 mg, Oral, Daily, Doutova, Anastassia, MD, 20 mg at 06/12/20 0935 .  predniSONE (DELTASONE) tablet 40 mg, 40 mg, Oral, Q breakfast, Jerimy Johanson, MD, 40 mg at 06/12/20 9675 .  sodium chloride tablet 1 g, 1 g, Oral, BID WC, Shawna Clamp, MD, 1 g at 06/12/20 0935    ALLERGIES   Penicillins     REVIEW OF SYSTEMS    Review of Systems:  Gen:  Denies  fever, sweats, chills weigh loss  HEENT: Denies blurred vision, double vision, ear pain, eye pain, hearing loss, nose bleeds, sore throat Cardiac:  No dizziness, chest pain or heaviness, chest tightness,edema Resp:  Complains of cough rpductive of phlegm Gi: Denies swallowing difficulty, stomach pain, nausea or vomiting, diarrhea, constipation, bowel incontinence Gu:  Denies bladder incontinence, burning urine Ext:   Denies Joint pain, stiffness or swelling Skin: Denies  skin  rash, easy bruising or bleeding or hives Endoc:  Denies polyuria, polydipsia , polyphagia or weight change Psych:   Denies depression, insomnia or hallucinations   Other:  All other systems negative   VS: BP 121/67 (BP Location: Left Arm)   Pulse 95   Temp (!) 97.3 F (36.3 C) (Oral)   Resp 16   Ht 6' 1"  (1.854 m)   Wt 88.4 kg   SpO2 93%   BMI 25.70 kg/m      PHYSICAL EXAM    GENERAL:NAD, no fevers, chills, no weakness no fatigue HEAD: Normocephalic, atraumatic.  EYES: Pupils equal, round, reactive to light. Extraocular muscles intact. No scleral icterus.  MOUTH: Moist mucosal membrane. Dentition intact. No abscess noted.  EAR, NOSE, THROAT: Clear without exudates. No external lesions.  NECK: Supple. No thyromegaly. No nodules. No JVD.  PULMONARY: bilateral rhochi imporved from previous CARDIOVASCULAR:  S1 and S2. Regular rate and rhythm. No murmurs, rubs, or gallops. No edema. Pedal pulses 2+ bilaterally.  GASTROINTESTINAL: Soft, nontender, nondistended. No masses. Positive bowel sounds. No hepatosplenomegaly.  MUSCULOSKELETAL: No swelling, clubbing, or edema. Range of motion full in all extremities.  NEUROLOGIC: Cranial nerves II through XII are intact. No gross focal neurological deficits. Sensation intact. Reflexes intact.  SKIN: No ulceration, lesions, rashes, or cyanosis. Skin warm and dry. Turgor intact.  PSYCHIATRIC: Mood, affect within normal limits. The patient is awake, alert and oriented x 3. Insight, judgment intact.       IMAGING    DG Chest 2 View  Result Date: 05/14/2020 CLINICAL DATA:  Cough and shortness of breath EXAM: CHEST - 2 VIEW COMPARISON:  6/10/8 FINDINGS: Cardiac shadow is within normal limits. Aortic calcifications are again identified. Diffuse interstitial scarring and fibrosis is noted stable from the prior exam. Lungs are again hyperinflated. No acute bony abnormality is seen. IMPRESSION: COPD and chronic fibrotic changes.  No acute  abnormality noted. Electronically Signed   By: Inez Catalina M.D.   On: 05/14/2020 16:43   MR FOOT RIGHT WO CONTRAST  Result Date: 05/16/2020 CLINICAL DATA:  Right foot swelling and redness. EXAM: MRI OF THE RIGHT FOREFOOT WITHOUT CONTRAST TECHNIQUE: Multiplanar, multisequence MR imaging of the right foot was performed. No intravenous contrast was administered. COMPARISON:  Right foot x-rays dated May 14, 2020. FINDINGS: Bones/Joint/Cartilage No marrow signal abnormality. No acute fracture or dislocation. Old healed fracture of the base of the fifth metatarsal. Hallux valgus deformity. Mild first MTP joint osteoarthritis with small joint effusion. Mild midfoot osteoarthritis. Ligaments Collateral ligaments are intact. Muscles and Tendons Flexor and extensor tendons are intact. No tenosynovitis. Diffuse fatty atrophy of the intrinsic foot muscles. Soft tissue Diffuse soft tissue swelling. No fluid collection or hematoma. No soft tissue mass. IMPRESSION: 1. Diffuse soft tissue swelling, likely reflecting cellulitis. No abscess or osteomyelitis. Electronically Signed   By: Titus Dubin M.D.   On: 05/16/2020 09:21   DG Chest Portable 1 View  Result Date: 06/09/2020 CLINICAL DATA:  Shortness of breath EXAM: PORTABLE CHEST 1 VIEW COMPARISON:  May 14, 2020 FINDINGS: The heart size and mediastinal contours are mildly enlarged. Aortic knob calcifications are seen. Multifocal patchy interstitial opacities are seen throughout lower lungs, right greater than left. Biapical scarring is noted. No acute osseous abnormality. IMPRESSION: Multifocal airspace opacities, right greater than left, likely consistent with multifocal pneumonia Electronically Signed   By: Prudencio Pair M.D.   On: 06/09/2020 21:26   DG Foot Complete Right  Result Date: 05/14/2020 CLINICAL DATA:  Concern for possible osteomyelitis. Bilateral foot swelling and redness. The top of the right foot is black. Ulcer to the bottom of the heel. EXAM:  RIGHT FOOT COMPLETE - 3+ VIEW COMPARISON:  None. FINDINGS: There is a hallux valgus deformity. No soft tissue gas identified. No bony erosion to suggest osteomyelitis. Deformity of the fifth metatarsal base is likely sequela of previous trauma. IMPRESSION: 1. Deformity of the fifth metatarsal base is likely due to previous trauma. No evidence of osteomyelitis on this study. MRI would be more sensitive for osteomyelitis. Electronically Signed   By: Dorise Bullion III M.D   On: 05/14/2020 20:04   ECHOCARDIOGRAM COMPLETE  Result Date: 06/11/2020    ECHOCARDIOGRAM REPORT   Patient Name:   BECK COFER Date of Exam: 06/11/2020 Medical Rec #:  [id number]           Height:  73.0 in Accession #:    [id number]          Weight:       192.4 lb Date of Birth:  1950-07-18           BSA:          2.116 m Patient Age:    73 years            BP:           109/55 mmHg Patient Gender: M                   HR:           114 bpm. Exam Location:  [facility] Procedure: 2D Echo, Cardiac Doppler and Color Doppler Indications:     Elevated troponin  History:         Patient has prior history of Echocardiogram examinations, most                  recent 08/04/2015. COPD; Risk Factors:Hypertension. Tobacco                  dependence.  Sonographer:     Sherrie Sport RDCS (AE) Referring Phys:  Geryl Rankins DOUTOVA Diagnosing Phys: Ida Rogue MD  Sonographer Comments: Technically difficult study due to poor echo windows, no apical window and no subcostal window. Image acquisition challenging due to COPD. IMPRESSIONS  1. Left ventricular ejection fraction, by estimation, is 60 to 65%. The left ventricle has normal function. The left ventricle has no regional wall motion abnormalities. Left ventricular diastolic parameters are indeterminate.  2. Right ventricular systolic function is normal. The right ventricular size is normal.  3. Challenging images FINDINGS  Left Ventricle: Left ventricular ejection fraction, by estimation, is 60 to  65%. The left ventricle has normal function. The left ventricle has no regional wall motion abnormalities. The left ventricular internal cavity size was normal in size. There is  no left ventricular hypertrophy. Left ventricular diastolic parameters are indeterminate. Right Ventricle: The right ventricular size is normal. No increase in right ventricular wall thickness. Right ventricular systolic function is normal. Left Atrium: Left atrial size was normal in size. Right Atrium: Right atrial size was normal in size. Pericardium: There is no evidence of pericardial effusion. Mitral Valve: The mitral valve is normal in structure. There is mild thickening of the mitral valve leaflet(s). No evidence of mitral valve regurgitation. No evidence of mitral valve stenosis. Tricuspid Valve: The tricuspid valve is normal in structure. Tricuspid valve regurgitation is not demonstrated. No evidence of tricuspid stenosis. Aortic Valve: The aortic valve was not well visualized. Aortic valve regurgitation is not visualized. Mild to moderate aortic valve sclerosis/calcification is present, without any evidence of aortic stenosis. Pulmonic Valve: The pulmonic valve was normal in structure. Pulmonic valve regurgitation is not visualized. No evidence of pulmonic stenosis. Aorta: The aortic root is normal in size and structure. Venous: The inferior vena cava is normal in size with greater than 50% respiratory variability, suggesting right atrial pressure of 3 mmHg. IAS/Shunts: No atrial level shunt detected by color flow Doppler.  LEFT VENTRICLE PLAX 2D LVIDd:         4.38 cm LVIDs:         2.52 cm LV PW:         1.12 cm LV IVS:        1.24 cm LVOT diam:     2.00 cm LVOT Area:     3.14 cm  LEFT ATRIUM         Index LA diam:    4.00 cm 1.89 cm/m                        PULMONIC VALVE AORTA                 PV Vmax:        0.95 m/s Ao Root diam: 3.60 cm PV Peak grad:   3.6 mmHg                       RVOT Peak grad: 5 mmHg   SHUNTS Systemic  Diam: 2.00 cm Ida Rogue MD Electronically signed by Ida Rogue MD Signature Date/Time: 06/11/2020/1:29:30 PM    Final       ASSESSMENT/PLAN    Acute on chronic hypoxemic respiratory failure - present on admission  - COVID19 negative   - supplemental O2 during my evaluation 6L/min  - will perform infectious workup for pneumonia -Respiratory viral panel -serum fungitell -legionella ab-negative -strep pneumoniae ur AG-negative -Histoplasma Ur Ag -sputum resp cultures-GPCs in clusters -suggesitive of staph species - MRSA pcr-negative -AFB sputum expectorated specimen -sputum cytology  -reviewed pertinent imaging with patient today - ESR/CRP -PT/OT for d/c planning  -please encourage patient to use incentive spirometer few times each hour while hospitalized.   -procalcitonin is mildly elevated - will trend -agree with zithromax and rocephin for CAP regimen at this time -wean FiO2 as able patient is generally on 2L/min  at home -s/p TTE - diastology could not be assessed due to poor windows, will obtain BNP today as since there is interstitial edema on current CXR.   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   Patient/Family are satisfied with care plan and all questions have been answered.  This document was prepared using Dragon voice recognition software and may include unintentional dictation errors.     Ottie Glazier, M.D.  Division of [place]

## 2020-06-12 NOTE — Progress Notes (Signed)
Physical Therapy Treatment Patient Details Name: Jeremiah [name] MRN: [id number] DOB: 10-09-1950 Today's Date: 06/12/2020    History of Present Illness presented to ER secondary to progressive SOB, cough x2-3 days; admitted for management of acute/chronic respiratory failure with hypoxia due to COPD exacerbation.  Of note, recently hospitalized (Jan, 2021) due to R foot cellulitis, covid +.    PT Comments    Today the pt presents on 4L of O2, progressing towards his baseline of 2L of O2. On this date he requires titration of oxygen to 6L during mobility in order to maintain saturation of >88%. The pt demonstrates decreased activity tolerance as he required x2 standing rest breaks for 70' of gait. The pt would greatly benefit from continued skilled PT will in house and will require HHPT for safe d/c home.   Follow Up Recommendations  Home health PT     Equipment Recommendations  Rolling walker with 5" wheels;3in1 (PT)    Recommendations for Other Services       Precautions / Restrictions Precautions Precautions: Fall Precaution Comments: foot wounds Restrictions Weight Bearing Restrictions: No Other Position/Activity Restrictions: b/l foot wounds and terribly long toe nails    Mobility  Bed Mobility Overal bed mobility: Modified Independent (HOB elevated) Bed Mobility: Supine to Sit     Supine to sit: Supervision;HOB elevated Sit to supine: Min assist;HOB elevated      Transfers Overall transfer level: Needs assistance Equipment used: Rolling walker (2 wheeled) Transfers: Sit to/from Stand Sit to Stand: Supervision         General transfer comment: Pt agreeable to use of RW for mobility.  Ambulation/Gait Ambulation/Gait assistance: Min guard Gait Distance (Feet): 70 Feet Assistive device: Rolling walker (2 wheeled) Gait Pattern/deviations: Trunk flexed;Shuffle     General Gait Details: Pt demonstrating a rushed gait pattern to attempt to get to the turn  around point in order to return back to room. O2 titrated to 6L for mobility, maintaining >88% with ambulation.   Stairs             Wheelchair Mobility    Modified Rankin (Stroke Patients Only)       Balance Overall balance assessment: Needs assistance Sitting-balance support: Feet supported;No upper extremity supported Sitting balance-Leahy Scale: Good Sitting balance - Comments: Pt sitting at EOB for several minutes to rest following supine<>sit transfer and in order to improve SpO2 prior to ambulation.   Standing balance support: Bilateral upper extremity supported Standing balance-Leahy Scale: Fair                              Cognition Arousal/Alertness: Awake/alert Behavior During Therapy: WFL for tasks assessed/performed Overall Cognitive Status: Within Functional Limits for tasks assessed                                        Exercises      General Comments General comments (skin integrity, edema, etc.): pt on 6L during ambulation. Following gait training pt has desaturation to 85% with need for 90s in order to recover to >90%.      Pertinent Vitals/Pain Pain Assessment: No/denies pain    Home Living                      Prior Function  PT Goals (current goals can now be found in the care plan section) Acute Rehab PT Goals Patient Stated Goal: to improve breathing PT Goal Formulation: With patient Time For Goal Achievement: 06/24/20 Potential to Achieve Goals: Good Progress towards PT goals: Progressing toward goals    Frequency    Min 2X/week      PT Plan Current plan remains appropriate    Co-evaluation              AM-PAC PT "6 Clicks" Mobility   Outcome Measure  Help needed turning from your back to your side while in a flat bed without using bedrails?: None Help needed moving from lying on your back to sitting on the side of a flat bed without using bedrails?: None Help  needed moving to and from a bed to a chair (including a wheelchair)?: A Little Help needed standing up from a chair using your arms (e.g., wheelchair or bedside chair)?: A Little Help needed to walk in hospital room?: A Little Help needed climbing 3-5 steps with a railing? : A Little 6 Click Score: 20    End of Session Equipment Utilized During Treatment: Oxygen Activity Tolerance: Patient limited by fatigue Patient left: in bed;with call bell/phone within reach Nurse Communication: Mobility status PT Visit Diagnosis: Muscle weakness (generalized) (M62.81);Difficulty in walking, not elsewhere classified (R26.2)     Time: 2549-8264 PT Time Calculation (min) (ACUTE ONLY): 32 min  Charges:  $Gait Training: 23-37 mins                    4:11 PM, 06/12/20 Beverlee Wilmarth A. Saverio Danker PT, DPT Physical Therapist - [facility]    [name] 06/12/2020, 4:03 PM

## 2020-06-12 NOTE — Progress Notes (Signed)
PROGRESS NOTE    Jeremiah [name]  RJJ:[id number] DOB: 12-03-1950 DOA: 06/09/2020 PCP: System, Provider Not In   Brief Narrative:  This patient is 70 years old male with PMH significant for COPD on home oxygen at 2 L, hypertension, tobacco abuse, hyponatremia, GERD, history of recent Covid infection presents in the emergency department with productive cough and shortness of breath.  He is found to be hypoxic,  requiring 6 L of oxygen to maintain saturation above 91%, found to have leukocytosis.  He was hospitalized last month for Covid pneumonia and was treated with remdesivir, steroids bronchodilators and has completed treatment while hospitalized. Patient is admitted for sepsis secondary to multifocal pneumonia and acute on chronic hypoxic respiratory failure requiring 6 L of oxygen.  Assessment & Plan:   Active Problems:   Acute on chronic respiratory failure with hypoxia (HCC)   Tobacco dependency   COPD with chronic bronchitis (HCC)   Hyponatremia   Sepsis ([place])   HTN (hypertension)   Severe sepsis secondary to multifocal pneumonia. Patient was found to be tachycardic, tachypneic, leucocytosis, hypoxic with chest x-ray shows infiltrate.  Procalcitonin 1.02, lactic acid 1.4. Initiated on empirical antibiotics (vancomycin , aztreonam and Zithromax.) Blood cultures no growth so far .Descalate antibiotics after cultures back. MRSA nares negative.  Vancomycin discontinued. Continue ceftriaxone and Zithromax. Patient was well hydrated in the ER. monitor wbc trend.  WBC trending down. Sepsis physiology improving  Acute on chronic hypoxic respiratory failure: Most likely secondary to bacterial superinfection with previous history of Covid. Continue supplemental oxygen to keep saturation above 94%. Continue steroids and nebulization.  Changed to PO prednisone taper Pulmonology consulted, advised to continue antibiotics. Patient has completed remdesivir treatment on previous  admission. He is weaned down to 4 L/ min [place]. Marland Kitchen Hypertension: Resume home medications.   Continue Cardizem and lisinopril but hold HCTZ  COPD: Continue albuterol as needed. Continue supplemental oxygen.   Chronic hyponatremia: baseline sodium 130 Differential diagnosis pneumonia,, hydrochlorothiazide and dehydration. Continue IV hydration,  hold HCTZ. Start salt tablets.  NA 127 on 2/5 Recheck a.m. labs  Hyperlipidemia: Continue Lipitor  Elevated troponin :  could be due to demand ischemia from sepsis. Troponin slightly trended up and down. Patient denies any chest, dizziness, palpitations. EKG: No acute changes.  DVT prophylaxis:  Lovenox Code Status: Full code. Family Communication:  No family at bed side. Disposition Plan:   Status is: Inpatient  Remains inpatient appropriate because:Inpatient level of care appropriate due to severity of illness   Dispo: The patient is from: Home              Anticipated d/c is to: Home              Anticipated d/c date is: > 3 days              Patient currently is not medically stable to d/c.   Difficult to place patient No    Consultants:   None  Procedures:  Antimicrobials:   Anti-infectives (From admission, onward)   Start     Dose/Rate Route Frequency Ordered Stop   06/11/20 1600  cefTRIAXone (ROCEPHIN) 2 g in sodium chloride 0.9 % 100 mL IVPB        2 g 200 mL/hr over 30 Minutes Intravenous Every 24 hours 06/11/20 1505     06/10/20 2200  azithromycin (ZITHROMAX) 500 mg in sodium chloride 0.9 % 250 mL IVPB        500 mg 250 mL/hr over  60 Minutes Intravenous Every 24 hours 06/09/20 2300 06/14/20 2359   06/10/20 1200  vancomycin (VANCOREADY) IVPB 1250 mg/250 mL  Status:  Discontinued        1,250 mg 166.7 mL/hr over 90 Minutes Intravenous Every 12 hours 06/09/20 2323 06/11/20 1505   06/10/20 0600  aztreonam (AZACTAM) 2 g in sodium chloride 0.9 % 100 mL IVPB  Status:  Discontinued        2 g 200 mL/hr over 30  Minutes Intravenous Every 8 hours 06/09/20 2319 06/11/20 1505   06/09/20 2330  vancomycin (VANCOREADY) IVPB 2000 mg/400 mL        2,000 mg 200 mL/hr over 120 Minutes Intravenous  Once 06/09/20 2320 06/10/20 0302   06/09/20 2230  vancomycin (VANCOCIN) IVPB 1000 mg/200 mL premix  Status:  Discontinued        1,000 mg 200 mL/hr over 60 Minutes Intravenous  Once 06/09/20 2217 06/09/20 2320   06/09/20 2200  levofloxacin (LEVAQUIN) IVPB 750 mg        750 mg 100 mL/hr over 90 Minutes Intravenous  Once 06/09/20 2148 06/09/20 2346     Subjective: Patient was seen and examined at bedside.  Overnight events noted.   Patient reports feeling much better, he still reports having cough and shortness of breath with exertion. He is weaned down to 4 l/m   Objective: Vitals:   06/12/20 0351 06/12/20 0511 06/12/20 0823 06/12/20 1135  BP:  109/63 124/68 121/67  Pulse:  (!) 108 (!) 101 95  Resp:  18 18 16   Temp:  98.9 F (37.2 C) 99.3 F (37.4 C) (!) 97.3 F (36.3 C)  TempSrc:  Oral Oral Oral  SpO2: 95% 100% 93% 93%  Weight:  88.4 kg    Height:        Intake/Output Summary (Last 24 hours) at 06/12/2020 1534 Last data filed at 06/12/2020 1130 Gross per 24 hour  Intake 730 ml  Output 1425 ml  Net -695 ml   Filed Weights   06/09/20 2101 06/11/20 0346 06/12/20 0511  Weight: 87.5 kg 87.3 kg 88.4 kg    Examination:  General exam: Appears calm and comfortable, not in any acute distress. Respiratory system: Clear to auscultation. Respiratory effort normal. Cardiovascular system: S1 & S2 heard, RRR. No JVD, murmurs, rubs, gallops or clicks. No pedal edema. Gastrointestinal system: Abdomen is nondistended, soft and nontender. No organomegaly or masses felt. Normal bowel sounds heard. Central nervous system: Alert and oriented. No focal neurological deficits. Extremities: Symmetric 5 x 5 power. No edema, no cyanosis, no clubbing Skin: No rashes, lesions or ulcers Psychiatry: Judgement and insight  appear normal. Mood & affect appropriate.     Data Reviewed: I have personally reviewed following labs and imaging studies  CBC: Recent Labs  Lab 06/09/20 2110 06/10/20 0518 06/11/20 0541 06/12/20 0411  WBC 31.1* 29.2* 20.8* 21.3*  NEUTROABS  --  27.3*  --   --   HGB 10.4* 10.5* 10.6* 10.0*  HCT 30.5* 31.1* 30.4* 29.4*  MCV 86.6 87.4 87.1 87.0  PLT 369 372 345 381   Basic Metabolic Panel: Recent Labs  Lab 06/09/20 2110 06/10/20 0518 06/11/20 0541 06/12/20 0411  NA 123* 125* 123* 127*  K 4.2 4.1 3.8 3.4*  CL 88* 93* 93* 96*  CO2 24 24 23 23   GLUCOSE 109* 142* 96 93  BUN 20 17 19 16   CREATININE 0.88 0.62 0.55* 0.50*  CALCIUM 8.6* 8.6* 8.6* 8.2*  MG  --  1.6* 1.5*  1.6*  PHOS  --  2.6 2.1* 2.7   GFR: Estimated Creatinine Clearance: 98.5 mL/min (A) (by C-G formula based on SCr of 0.5 mg/dL (L)). Liver Function Tests: Recent Labs  Lab 06/09/20 2110 06/10/20 0518 06/11/20 0541  AST 24 19 33  ALT 21 22 36  ALKPHOS 122 120 134*  BILITOT 1.1 0.7 0.7  PROT 6.7 7.1 6.3*  ALBUMIN 3.2* 3.2* 2.8*   No results for input(s): LIPASE, AMYLASE in the last 168 hours. No results for input(s): AMMONIA in the last 168 hours. Coagulation Profile: Recent Labs  Lab 06/09/20 2326  INR 1.4*   Cardiac Enzymes: Recent Labs  Lab 06/10/20 0518  CKTOTAL 74   BNP (last 3 results) No results for input(s): PROBNP in the last 8760 hours. HbA1C: No results for input(s): HGBA1C in the last 72 hours. CBG: No results for input(s): GLUCAP in the last 168 hours. Lipid Profile: No results for input(s): CHOL, HDL, LDLCALC, TRIG, CHOLHDL, LDLDIRECT in the last 72 hours. Thyroid Function Tests: Recent Labs    06/10/20 0518  TSH 1.040   Anemia Panel: Recent Labs    06/10/20 0518  FERRITIN 219   Sepsis Labs: Recent Labs  Lab 06/09/20 2156 06/09/20 2326 06/10/20 0518 06/11/20 0541  PROCALCITON  --  0.84 1.02 1.05  LATICACIDVEN 1.4  --   --   --     Recent Results (from  the past 240 hour(s))  SARS Coronavirus 2 by RT PCR (hospital order, performed in [place] hospital lab) Nasopharyngeal Nasopharyngeal Swab     Status: None   Collection Time: 06/09/20  9:02 PM   Specimen: Nasopharyngeal Swab  Result Value Ref Range Status   SARS Coronavirus 2 NEGATIVE NEGATIVE Final    Comment: (NOTE) SARS-CoV-2 target nucleic acids are NOT DETECTED.  The SARS-CoV-2 RNA is generally detectable in upper and lower respiratory specimens during the acute phase of infection. The lowest concentration of SARS-CoV-2 viral copies this assay can detect is 250 copies / mL. A negative result does not preclude SARS-CoV-2 infection and should not be used as the sole basis for treatment or other patient management decisions.  A negative result may occur with improper specimen collection / handling, submission of specimen other than nasopharyngeal swab, presence of viral mutation(s) within the areas targeted by this assay, and inadequate number of viral copies (<250 copies / mL). A negative result must be combined with clinical observations, patient history, and epidemiological information.  Fact Sheet for Patients:   StrictlyIdeas.no  Fact Sheet for Healthcare Providers: BankingDealers.co.za  This test is not yet approved or  cleared by the [place] FDA and has been authorized for detection and/or diagnosis of SARS-CoV-2 by FDA under an Emergency Use Authorization (EUA).  This EUA will remain in effect (meaning this test can be used) for the duration of the COVID-19 declaration under Section 564(b)(1) of the Act, 21 U.S.C. section 360bbb-3(b)(1), unless the authorization is terminated or revoked sooner.  Performed at [facility], [address] [place], [place] [address]   Blood culture (routine x 2)     Status: None (Preliminary result)   Collection Time: 06/09/20  9:50 PM   Specimen: BLOOD  Result Value  Ref Range Status   Specimen Description BLOOD LEFT ANTECUBITAL  Final   Special Requests   Final    BOTTLES DRAWN AEROBIC AND ANAEROBIC Blood Culture results may not be optimal due to an inadequate volume of blood received in culture bottles  Culture   Final    NO GROWTH 3 DAYS Performed at [facility], [address] [place], [place] [address]    Report Status PENDING  Incomplete  Blood culture (routine x 2)     Status: None (Preliminary result)   Collection Time: 06/09/20  9:56 PM   Specimen: BLOOD  Result Value Ref Range Status   Specimen Description BLOOD RIGHT ANTECUBITAL  Final   Special Requests   Final    BOTTLES DRAWN AEROBIC AND ANAEROBIC Blood Culture results may not be optimal due to an inadequate volume of blood received in culture bottles   Culture   Final    NO GROWTH 3 DAYS Performed at [facility], [address], [place], [place] [address]    Report Status PENDING  Incomplete  Culture, sputum-assessment     Status: None   Collection Time: 06/10/20  1:00 PM   Specimen: Expectorated Sputum  Result Value Ref Range Status   Specimen Description EXPECTORATED SPUTUM  Final   Special Requests NONE  Final   Sputum evaluation   Final    THIS SPECIMEN IS ACCEPTABLE FOR SPUTUM CULTURE Performed at [facility], [address] [place], [place] [address]    Report Status 06/10/2020 FINAL  Final  Culture, respiratory     Status: None (Preliminary result)   Collection Time: 06/10/20  1:00 PM  Result Value Ref Range Status   Specimen Description   Final    EXPECTORATED SPUTUM Performed at [facility], [address], [place], [place] [address]    Special Requests   Final    NONE Reflexed from [id number] Performed at [facility], [address] [place], [name] [address]    Gram Stain   Final    FEW WBC PRESENT,BOTH PMN AND MONONUCLEAR MODERATE GRAM POSITIVE COCCI IN CLUSTERS RARE GRAM NEGATIVE RODS    Culture    Final    MODERATE STAPHYLOCOCCUS AUREUS CULTURE REINCUBATED FOR BETTER GROWTH SUSCEPTIBILITIES TO FOLLOW Performed at [facility], [place] [address] [place], [place] [address]    Report Status PENDING  Incomplete  MRSA PCR Screening     Status: None   Collection Time: 06/11/20  9:57 AM   Specimen: Nasal Mucosa; Nasopharyngeal  Result Value Ref Range Status   MRSA by PCR NEGATIVE NEGATIVE Final    Comment:        The GeneXpert MRSA Assay (FDA approved for NASAL specimens only), is one component of a comprehensive MRSA colonization surveillance program. It is not intended to diagnose MRSA infection nor to guide or monitor treatment for MRSA infections. Performed at [facility], [place],  [address]   Respiratory (~20 pathogens) panel by PCR     Status: None   Collection Time: 06/11/20  7:14 PM   Specimen: Nasopharyngeal Swab; Respiratory  Result Value Ref Range Status   Adenovirus NOT DETECTED NOT DETECTED Final   Coronavirus 229E NOT DETECTED NOT DETECTED Final    Comment: (NOTE) The Coronavirus on the Respiratory Panel, DOES NOT test for the novel  Coronavirus (2019 nCoV)    Coronavirus HKU1 NOT DETECTED NOT DETECTED Final   Coronavirus NL63 NOT DETECTED NOT DETECTED Final   Coronavirus OC43 NOT DETECTED NOT DETECTED Final   Metapneumovirus NOT DETECTED NOT DETECTED Final   Rhinovirus / Enterovirus NOT DETECTED NOT DETECTED Final   Influenza A NOT DETECTED NOT DETECTED Final   Influenza B NOT DETECTED NOT DETECTED Final  Parainfluenza Virus 1 NOT DETECTED NOT DETECTED Final   Parainfluenza Virus 2 NOT DETECTED NOT DETECTED Final   Parainfluenza Virus 3 NOT DETECTED NOT DETECTED Final   Parainfluenza Virus 4 NOT DETECTED NOT DETECTED Final   Respiratory Syncytial Virus NOT DETECTED NOT DETECTED Final   Bordetella pertussis NOT DETECTED NOT DETECTED Final   Bordetella Parapertussis NOT DETECTED NOT DETECTED Final   Chlamydophila  pneumoniae NOT DETECTED NOT DETECTED Final   Mycoplasma pneumoniae NOT DETECTED NOT DETECTED Final    Comment: Performed at [facility], [place] [address], [place], [place] [address]         Radiology Studies: ECHOCARDIOGRAM COMPLETE  Result Date: 06/11/2020    ECHOCARDIOGRAM REPORT   Patient Name:   SILER MAVIS Date of Exam: 06/11/2020 Medical Rec #:  [id number]           Height:       73.0 in Accession #:    [id number]          Weight:       192.4 lb Date of Birth:  [birth year]           BSA:          2.116 m Patient Age:    30 years            BP:           109/55 mmHg Patient Gender: M                   HR:           114 bpm. Exam Location:  [facility] Procedure: 2D Echo, Cardiac Doppler and Color Doppler Indications:     Elevated troponin  History:         Patient has prior history of Echocardiogram examinations, most                  recent 08/04/2015. COPD; Risk Factors:Hypertension. Tobacco                  dependence.  Sonographer:     Sherrie Sport RDCS (AE) Referring Phys:  Geryl Rankins DOUTOVA Diagnosing Phys: Ida Rogue MD  Sonographer Comments: Technically difficult study due to poor echo windows, no apical window and no subcostal window. Image acquisition challenging due to COPD. IMPRESSIONS  1. Left ventricular ejection fraction, by estimation, is 60 to 65%. The left ventricle has normal function. The left ventricle has no regional wall motion abnormalities. Left ventricular diastolic parameters are indeterminate.  2. Right ventricular systolic function is normal. The right ventricular size is normal.  3. Challenging images FINDINGS  Left Ventricle: Left ventricular ejection fraction, by estimation, is 60 to 65%. The left ventricle has normal function. The left ventricle has no regional wall motion abnormalities. The left ventricular internal cavity size was normal in size. There is  no left ventricular hypertrophy. Left ventricular diastolic parameters are indeterminate. Right  Ventricle: The right ventricular size is normal. No increase in right ventricular wall thickness. Right ventricular systolic function is normal. Left Atrium: Left atrial size was normal in size. Right Atrium: Right atrial size was normal in size. Pericardium: There is no evidence of pericardial effusion. Mitral Valve: The mitral valve is normal in structure. There is mild thickening of the mitral valve leaflet(s). No evidence of mitral valve regurgitation. No evidence of mitral valve stenosis. Tricuspid Valve: The tricuspid valve is normal in structure. Tricuspid valve regurgitation is not demonstrated. No evidence of tricuspid  stenosis. Aortic Valve: The aortic valve was not well visualized. Aortic valve regurgitation is not visualized. Mild to moderate aortic valve sclerosis/calcification is present, without any evidence of aortic stenosis. Pulmonic Valve: The pulmonic valve was normal in structure. Pulmonic valve regurgitation is not visualized. No evidence of pulmonic stenosis. Aorta: The aortic root is normal in size and structure. Venous: The inferior vena cava is normal in size with greater than 50% respiratory variability, suggesting right atrial pressure of 3 mmHg. IAS/Shunts: No atrial level shunt detected by color flow Doppler.  LEFT VENTRICLE PLAX 2D LVIDd:         4.38 cm LVIDs:         2.52 cm LV PW:         1.12 cm LV IVS:        1.24 cm LVOT diam:     2.00 cm LVOT Area:     3.14 cm  LEFT ATRIUM         Index LA diam:    4.00 cm 1.89 cm/m                        PULMONIC VALVE AORTA                 PV Vmax:        0.95 m/s Ao Root diam: 3.60 cm PV Peak grad:   3.6 mmHg                       RVOT Peak grad: 5 mmHg   SHUNTS Systemic Diam: 2.00 cm Ida Rogue MD Electronically signed by Ida Rogue MD Signature Date/Time: 06/11/2020/1:29:30 PM    Final     Scheduled Meds: . atorvastatin  40 mg Oral Daily  . enoxaparin (LOVENOX) injection  40 mg Subcutaneous Q24H  . furosemide  20 mg  Intravenous Daily  . guaiFENesin  600 mg Oral BID  . ipratropium-albuterol  3 mL Nebulization Q4H  . montelukast  10 mg Oral QHS  . nicotine  21 mg Transdermal Daily  . pantoprazole  20 mg Oral Daily  . [START ON 06/13/2020] predniSONE  30 mg Oral Q breakfast  . sodium chloride  1 g Oral BID WC   Continuous Infusions: . azithromycin 500 mg (06/11/20 2300)  . cefTRIAXone (ROCEPHIN)  IV 2 g (06/11/20 1717)     LOS: 3 days    Time spent: 25 mins    Orah Sonnen, MD [facility]   If 7PM-7AM, please contact night-coverage

## 2020-06-12 NOTE — Progress Notes (Signed)
OT Cancellation Note  Patient Details Name: Jeremiah [name] MRN: [id number] DOB: Feb 16, 1951   Cancelled Treatment:    Reason Eval/Treat Not Completed: Patient declined, no reason specified  Pt politely declines citing fatigue stating that he has already worked with physical therapy. Will f/u for OT treatment at later date/time as able. Thank you.  Gerrianne Scale, [place], OTR/L ascom [phone] 06/12/20, 3:01 PM

## 2020-06-13 DIAGNOSIS — J9621 Acute and chronic respiratory failure with hypoxia: Secondary | ICD-10-CM | POA: Diagnosis not present

## 2020-06-13 DIAGNOSIS — J189 Pneumonia, unspecified organism: Secondary | ICD-10-CM | POA: Diagnosis not present

## 2020-06-13 DIAGNOSIS — A419 Sepsis, unspecified organism: Secondary | ICD-10-CM | POA: Diagnosis not present

## 2020-06-13 DIAGNOSIS — J449 Chronic obstructive pulmonary disease, unspecified: Secondary | ICD-10-CM | POA: Diagnosis not present

## 2020-06-13 LAB — CULTURE, RESPIRATORY W GRAM STAIN

## 2020-06-13 LAB — BASIC METABOLIC PANEL
Anion gap: 12 (ref 5–15)
BUN: 21 mg/dL (ref 8–23)
CO2: 23 mmol/L (ref 22–32)
Calcium: 8.6 mg/dL — ABNORMAL LOW (ref 8.9–10.3)
Chloride: 90 mmol/L — ABNORMAL LOW (ref 98–111)
Creatinine, Ser: 0.51 mg/dL — ABNORMAL LOW (ref 0.61–1.24)
GFR, Estimated: >60 mL/min (ref >60)
Glucose, Bld: 135 mg/dL — ABNORMAL HIGH (ref 70–99)
Potassium: 4.1 mmol/L (ref 3.5–5.1)
Sodium: 125 mmol/L — ABNORMAL LOW (ref 135–145)

## 2020-06-13 LAB — PHOSPHORUS: Phosphorus: 3.3 mg/dL (ref 2.5–4.6)

## 2020-06-13 LAB — MAGNESIUM: Magnesium: 2 mg/dL (ref 1.7–2.4)

## 2020-06-13 MED ORDER — IPRATROPIUM-ALBUTEROL 0.5-2.5 (3) MG/3ML IN SOLN
3.0000 mL | Freq: Four times a day (QID) | RESPIRATORY_TRACT | Status: DC
  Administered 2020-06-13 – 2020-06-14 (×3): 3 mL via RESPIRATORY_TRACT
  Filled 2020-06-13 (×3): qty 3

## 2020-06-13 MED ORDER — DOXYCYCLINE HYCLATE 100 MG PO TABS
100.0000 mg | ORAL_TABLET | Freq: Two times a day (BID) | ORAL | Status: DC
  Administered 2020-06-13 – 2020-06-17 (×9): 100 mg via ORAL
  Filled 2020-06-13 (×9): qty 1

## 2020-06-13 MED ORDER — PREDNISONE 20 MG PO TABS
20.0000 mg | ORAL_TABLET | Freq: Every day | ORAL | Status: DC
  Administered 2020-06-14: 20 mg via ORAL
  Filled 2020-06-13: qty 1

## 2020-06-13 NOTE — Progress Notes (Signed)
Pulmonary Medicine          Date: 06/13/2020,   MRN# [id number] Jeremiah [name] Jan 25, 1951     AdmissionWeight: 87.5 kg                 CurrentWeight: 87.5 kg   Referring physician: Dr Dwyane Dee   CHIEF COMPLAINT:   Multifocal pneumonia with acute on chronic hypoxemic respiratory failure.    HISTORY OF PRESENT ILLNESS   As per admission h/p Jeremiah [name] is a 70 y.o. male with medical history significant of COPD on home oxygen at 2 L with, HTN, tobacco abuse, hyponatremia, GERD, history of recent Covid infection.  Presented with cough and shortness of breath For the past 2 to 3 days have been having cough productive of thick sputum Noted to have elevated white blood cell count and worsening hypoxia requiring up to 6 L to maintain oxygen saturation 91%  Patient tested Covid positive about 1 month ago Last month was admitted with sepsis secondary to right foot cellulitis.  Was treated with IV vancomycin and meropenem.  Patient was found to be Covid positive on 7 January. Found to have Covid pneumonia treated for remdesivir steroids bronchodilators and completed his treatment while hospitalized.  He was discharged to SNF. Serial CXR reviewed by me shows flattening of hemidiaphragms bilaterally with interstitial opacification suugestive of chronic lung disease with component of pulmonary edema.    06/12/2020- patient feels better this am, he is weaned to 4L/min [place]. He has PT coming in today and he is excited to get OOB and ambulate. I have encouraged him to try 32f walking today.  From pulmonary perspective can initiate dc planning  06/13/2020-  Patient +resistant staph aureus likely etiology of his community acquired pneumonia.  He ambulated with PT today.  O2 weaned to 3L/min.  Na level is fluctuated still low.   PAST MEDICAL HISTORY   Past Medical History:  Diagnosis Date  . Asthma   . COPD (chronic obstructive pulmonary disease) (HCC)    O2 dependent - 2L  .  GERD (gastroesophageal reflux disease)   . HTN (hypertension)   . Oxygen dependent   . Tobacco dependence      SURGICAL HISTORY   Past Surgical History:  Procedure Laterality Date  . CATARACT EXTRACTION W/PHACO Left 12/09/2019   Procedure: CATARACT EXTRACTION PHACO AND INTRAOCULAR LENS PLACEMENT (IOC) COMPLICATED LEFT [id number]  Surgeon: PBirder Robson MD;  [place] [facility]  Service: Ophthalmology;  [place]ality: Left;  [place] CATARACT EXTRACTION W/PHACO Right 01/27/2020   Procedure: CATARACT EXTRACTION PHACO AND INTRAOCULAR LENS PLACEMENT ([place] RIGHT VISION BLUE 26.83 02:07.6;  Surgeon: PBirder Robson MD;  [place] [facility]  Service: Ophthalmology;  Laterality: Right;  . HERNIA REPAIR    . NECK SURGERY       FAMILY HISTORY   Family History  Problem Relation Age of Onset  . Cancer - Colon Father   . Congestive Heart Failure Mother      SOCIAL HISTORY   Social History   Tobacco Use  . Smoking status: Current Every Day Smoker    Packs/day: 0.50    Years: 50.00    Pack years: 25.00    Types: Cigarettes    Last attempt to quit: 07/28/2015    Years since quitting: 4.8  . Smokeless tobacco: Never Used  . Tobacco comment: started age 70  Vaping Use  . Vaping Use: Never used  Substance Use Topics  .  Alcohol use: Yes    Alcohol/week: 0.0 standard drinks    Comment: beers occasionally  . Drug use: No     MEDICATIONS    Home Medication:    Current Medication:  Current Facility-Administered Medications:  .  acetaminophen (TYLENOL) tablet 650 mg, 650 mg, Oral, Q6H PRN, 650 mg at 06/13/20 1217 **OR** acetaminophen (TYLENOL) suppository 650 mg, 650 mg, Rectal, Q6H PRN, Doutova, Anastassia, MD .  albuterol (PROVENTIL) (2.5 MG/3ML) 0.083% nebulizer solution 2.5 mg, 2.5 mg, Nebulization, Q2H PRN, Doutova, Anastassia, MD, 2.5 mg at 06/13/20 1435 .  albuterol (VENTOLIN HFA) 108 (90 Base) MCG/ACT inhaler 2 puff, 2 puff,  Inhalation, Q4H PRN, Doutova, Anastassia, MD .  atorvastatin (LIPITOR) tablet 40 mg, 40 mg, Oral, Daily, Doutova, Anastassia, MD, 40 mg at 06/13/20 0936 .  azithromycin (ZITHROMAX) 500 mg in sodium chloride 0.9 % 250 mL IVPB, 500 mg, Intravenous, Q24H, Oswald Hillock, RPH, Stopped at 06/12/20 2229 .  cefTRIAXone (ROCEPHIN) 2 g in sodium chloride 0.9 % 100 mL IVPB, 2 g, Intravenous, Q24H, Shawna Clamp, MD, Last Rate: 200 mL/hr at 06/12/20 2353, Infusion Verify at 06/12/20 2353 .  enoxaparin (LOVENOX) injection 40 mg, 40 mg, Subcutaneous, Q24H, Doutova, Anastassia, MD, 40 mg at 06/13/20 0937 .  furosemide (LASIX) injection 20 mg, 20 mg, Intravenous, Daily, Ottie Glazier, MD, 20 mg at 06/13/20 0937 .  guaiFENesin (MUCINEX) 12 hr tablet 600 mg, 600 mg, Oral, BID, Doutova, Anastassia, MD, 600 mg at 06/13/20 0936 .  ipratropium-albuterol (DUONEB) 0.5-2.5 (3) MG/3ML nebulizer solution 3 mL, 3 mL, Nebulization, Q6H, Zhang, Dekui, MD .  montelukast (SINGULAIR) tablet 10 mg, 10 mg, Oral, QHS, Doutova, Anastassia, MD, 10 mg at 06/12/20 2120 .  nicotine (NICODERM CQ - dosed in mg/24 hours) patch 21 mg, 21 mg, Transdermal, Daily, Doutova, Anastassia, MD, 21 mg at 06/13/20 0936 .  pantoprazole (PROTONIX) EC tablet 20 mg, 20 mg, Oral, Daily, Doutova, Anastassia, MD, 20 mg at 06/13/20 0936 .  predniSONE (DELTASONE) tablet 30 mg, 30 mg, Oral, Q breakfast, , , MD, 30 mg at 06/13/20 0936 .  sodium chloride tablet 1 g, 1 g, Oral, BID WC, Shawna Clamp, MD, 1 g at 06/13/20 0623    ALLERGIES   Penicillins     REVIEW OF SYSTEMS    Review of Systems:  Gen:  Denies  fever, sweats, chills weigh loss  HEENT: Denies blurred vision, double vision, ear pain, eye pain, hearing loss, nose bleeds, sore throat Cardiac:  No dizziness, chest pain or heaviness, chest tightness,edema Resp:  Complains of cough rpductive of phlegm Gi: Denies swallowing difficulty, stomach pain, nausea or vomiting,  diarrhea, constipation, bowel incontinence Gu:  Denies bladder incontinence, burning urine Ext:   Denies Joint pain, stiffness or swelling Skin: Denies  skin rash, easy bruising or bleeding or hives Endoc:  Denies polyuria, polydipsia , polyphagia or weight change Psych:   Denies depression, insomnia or hallucinations   Other:  All other systems negative   VS: BP 124/74 (BP Location: Left Arm)   Pulse 98   Temp 98.4 F (36.9 C) (Oral)   Resp 18   Ht 6' 1" (1.854 m)   Wt 87.5 kg   SpO2 92%   BMI 25.46 kg/m      PHYSICAL EXAM    GENERAL:NAD, no fevers, chills, no weakness no fatigue HEAD: Normocephalic, atraumatic.  EYES: Pupils equal, round, reactive to light. Extraocular muscles intact. No scleral icterus.  MOUTH: Moist mucosal membrane. Dentition intact. No abscess noted.  EAR, NOSE, THROAT: Clear without exudates. No external lesions.  NECK: Supple. No thyromegaly. No nodules. No JVD.  PULMONARY: bilateral rhochi imporved from previous CARDIOVASCULAR: S1 and S2. Regular rate and rhythm. No murmurs, rubs, or gallops. No edema. Pedal pulses 2+ bilaterally.  GASTROINTESTINAL: Soft, nontender, nondistended. No masses. Positive bowel sounds. No hepatosplenomegaly.  MUSCULOSKELETAL: No swelling, clubbing, or edema. Range of motion full in all extremities.  NEUROLOGIC: Cranial nerves II through XII are intact. No gross focal neurological deficits. Sensation intact. Reflexes intact.  SKIN: No ulceration, lesions, rashes, or cyanosis. Skin warm and dry. Turgor intact.  PSYCHIATRIC: Mood, affect within normal limits. The patient is awake, alert and oriented x 3. Insight, judgment intact.       IMAGING    DG Chest 2 View  Result Date: 05/14/2020 CLINICAL DATA:  Cough and shortness of breath EXAM: CHEST - 2 VIEW COMPARISON:  6/10/8 FINDINGS: Cardiac shadow is within normal limits. Aortic calcifications are again identified. Diffuse interstitial scarring and fibrosis is noted  stable from the prior exam. Lungs are again hyperinflated. No acute bony abnormality is seen. IMPRESSION: COPD and chronic fibrotic changes.  No acute abnormality noted. Electronically Signed   By: Inez Catalina M.D.   On: 05/14/2020 16:43   MR FOOT RIGHT WO CONTRAST  Result Date: 05/16/2020 CLINICAL DATA:  Right foot swelling and redness. EXAM: MRI OF THE RIGHT FOREFOOT WITHOUT CONTRAST TECHNIQUE: Multiplanar, multisequence MR imaging of the right foot was performed. No intravenous contrast was administered. COMPARISON:  Right foot x-rays dated May 14, 2020. FINDINGS: Bones/Joint/Cartilage No marrow signal abnormality. No acute fracture or dislocation. Old healed fracture of the base of the fifth metatarsal. Hallux valgus deformity. Mild first MTP joint osteoarthritis with small joint effusion. Mild midfoot osteoarthritis. Ligaments Collateral ligaments are intact. Muscles and Tendons Flexor and extensor tendons are intact. No tenosynovitis. Diffuse fatty atrophy of the intrinsic foot muscles. Soft tissue Diffuse soft tissue swelling. No fluid collection or hematoma. No soft tissue mass. IMPRESSION: 1. Diffuse soft tissue swelling, likely reflecting cellulitis. No abscess or osteomyelitis. Electronically Signed   By: Titus Dubin M.D.   On: 05/16/2020 09:21   DG Chest Portable 1 View  Result Date: 06/09/2020 CLINICAL DATA:  Shortness of breath EXAM: PORTABLE CHEST 1 VIEW COMPARISON:  May 14, 2020 FINDINGS: The heart size and mediastinal contours are mildly enlarged. Aortic knob calcifications are seen. Multifocal patchy interstitial opacities are seen throughout lower lungs, right greater than left. Biapical scarring is noted. No acute osseous abnormality. IMPRESSION: Multifocal airspace opacities, right greater than left, likely consistent with multifocal pneumonia Electronically Signed   By: Prudencio Pair M.D.   On: 06/09/2020 21:26   DG Foot Complete Right  Result Date: 05/14/2020 CLINICAL DATA:   Concern for possible osteomyelitis. Bilateral foot swelling and redness. The top of the right foot is black. Ulcer to the bottom of the heel. EXAM: RIGHT FOOT COMPLETE - 3+ VIEW COMPARISON:  None. FINDINGS: There is a hallux valgus deformity. No soft tissue gas identified. No bony erosion to suggest osteomyelitis. Deformity of the fifth metatarsal base is likely sequela of previous trauma. IMPRESSION: 1. Deformity of the fifth metatarsal base is likely due to previous trauma. No evidence of osteomyelitis on this study. MRI would be more sensitive for osteomyelitis. Electronically Signed   By: Dorise Bullion III M.D   On: 05/14/2020 20:04   ECHOCARDIOGRAM COMPLETE  Result Date: 06/11/2020    ECHOCARDIOGRAM REPORT   Patient Name:   Jeremiah  Phil Campbell [name] Date of Exam: 06/11/2020 Medical Rec #:  [id number]           Height:       73.0 in Accession #:    [id number]          Weight:       192.4 lb Date of Birth:  [birth year]           BSA:          2.116 m Patient Age:    24 years            BP:           109/55 mmHg Patient Gender: M                   HR:           114 bpm. Exam Location:  [facility] Procedure: 2D Echo, Cardiac Doppler and Color Doppler Indications:     Elevated troponin  History:         Patient has prior history of Echocardiogram examinations, most                  recent 08/04/2015. COPD; Risk Factors:Hypertension. Tobacco                  dependence.  Sonographer:     Sherrie Sport RDCS (AE) Referring Phys:  Geryl Rankins DOUTOVA Diagnosing Phys: Ida Rogue MD  Sonographer Comments: Technically difficult study due to poor echo windows, no apical window and no subcostal window. Image acquisition challenging due to COPD. IMPRESSIONS  1. Left ventricular ejection fraction, by estimation, is 60 to 65%. The left ventricle has normal function. The left ventricle has no regional wall motion abnormalities. Left ventricular diastolic parameters are indeterminate.  2. Right ventricular systolic function is normal.  The right ventricular size is normal.  3. Challenging images FINDINGS  Left Ventricle: Left ventricular ejection fraction, by estimation, is 60 to 65%. The left ventricle has normal function. The left ventricle has no regional wall motion abnormalities. The left ventricular internal cavity size was normal in size. There is  no left ventricular hypertrophy. Left ventricular diastolic parameters are indeterminate. Right Ventricle: The right ventricular size is normal. No increase in right ventricular wall thickness. Right ventricular systolic function is normal. Left Atrium: Left atrial size was normal in size. Right Atrium: Right atrial size was normal in size. Pericardium: There is no evidence of pericardial effusion. Mitral Valve: The mitral valve is normal in structure. There is mild thickening of the mitral valve leaflet(s). No evidence of mitral valve regurgitation. No evidence of mitral valve stenosis. Tricuspid Valve: The tricuspid valve is normal in structure. Tricuspid valve regurgitation is not demonstrated. No evidence of tricuspid stenosis. Aortic Valve: The aortic valve was not well visualized. Aortic valve regurgitation is not visualized. Mild to moderate aortic valve sclerosis/calcification is present, without any evidence of aortic stenosis. Pulmonic Valve: The pulmonic valve was normal in structure. Pulmonic valve regurgitation is not visualized. No evidence of pulmonic stenosis. Aorta: The aortic root is normal in size and structure. Venous: The inferior vena cava is normal in size with greater than 50% respiratory variability, suggesting right atrial pressure of 3 mmHg. IAS/Shunts: No atrial level shunt detected by color flow Doppler.  LEFT VENTRICLE PLAX 2D LVIDd:         4.38 cm LVIDs:         2.52 cm LV PW:         1.12  cm LV IVS:        1.24 cm LVOT diam:     2.00 cm LVOT Area:     3.14 cm  LEFT ATRIUM         Index LA diam:    4.00 cm 1.89 cm/m                        PULMONIC VALVE AORTA                  PV Vmax:        0.95 m/s Ao Root diam: 3.60 cm PV Peak grad:   3.6 mmHg                       RVOT Peak grad: 5 mmHg   SHUNTS Systemic Diam: 2.00 cm Ida Rogue MD Electronically signed by Ida Rogue MD Signature Date/Time: 06/11/2020/1:29:30 PM    Final           ASSESSMENT/PLAN    Acute on chronic hypoxemic respiratory failure - present on admission  - COVID19 negative   - supplemental O2 during my evaluation 6L/min  - will perform infectious workup for pneumonia -Respiratory viral panel -serum fungitell -legionella ab-negative -strep pneumoniae ur AG-negative -Histoplasma Ur Ag -sputum resp cultures-GPCs in clusters staph aureus - resitant -AFB sputum expectorated specimen -sputum cytology  -reviewed pertinent imaging with patient today - ESR/CRP -PT/OT for d/c planning  -please encourage patient to use incentive spirometer few times each hour while hospitalized.   -procalcitonin is mildly elevated - will trend -agree with zithromax and rocephin for CAP regimen at this time -wean FiO2 as able patient is generally on 2L/min [place] at home -s/p TTE - diastology could not be assessed due to poor windows, will obtain BNP today as since there is interstitial edema on current CXR.           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   Patient/Family are satisfied with care plan and all questions have been answered.  This document was prepared using Dragon voice recognition software and may include  unintentional dictation errors.     Ottie Glazier, M.D.  Division of [place]

## 2020-06-13 NOTE — Progress Notes (Signed)
PROGRESS NOTE    Jc Veron  UXL:[id number] DOB: 05/28/1950 DOA: 06/09/2020 PCP: System, Provider Not In   Chief complaint. Shortness of breath. Brief Narrative:  This patient is 70 years old male with PMH significant for COPD on home oxygen at 2 L, hypertension, tobacco abuse, hyponatremia, GERD, history of recent Covid infection presents in the emergency department with productive cough and shortness of breath.  He is found to be hypoxic,  requiring 6 L of oxygen to maintain saturation above 91%, found to have leukocytosis.  He was hospitalized last month for Covid pneumonia and was treated with remdesivir, steroids bronchodilators and has completed treatment while hospitalized. Patient is admitted for sepsis secondary to multifocal pneumonia and acute on chronic hypoxic respiratory failure requiring 6 L of oxygen.   Assessment & Plan:   Active Problems:   Acute on chronic respiratory failure with hypoxia (HCC)   Tobacco dependency   COPD with chronic bronchitis (HCC)   Hyponatremia   Sepsis ([place])   HTN (hypertension)  #1. Severe sepsis secondary to pneumonia Acute on chronic hypoxemic respiratory failure secondary to pneumonia. Multifocal pneumonia Patient was on 2 L oxygen at baseline, currently receiving Zithromax and Rocephin. Still on 3 L oxygen today, not at baseline. We will continue Zithromax and Rocephin. I will recheck a procalcitonin level tomorrow to make sure it dropped down. Most likely will discharge home tomorrow  2. Hyponatremia. Sodium level still low, currently on salt tablets. Discontinued HCTZ. Also start fluid restriction. Recheck a BMP tomorrow.  3. Demand ischemia secondary to sepsis. Stable.    DVT prophylaxis: Lovenox Code Status: Full Family Communication:  Disposition Plan:  .   Status is: Inpatient  Remains inpatient appropriate because:Inpatient level of care appropriate due to severity of illness   Dispo: The patient is from:  Home              Anticipated d/c is to: Home              Anticipated d/c date is: 1 day              Patient currently is not medically stable to d/c.   Difficult to place patient No        I/O last 3 completed shifts: In: 700 [IV Piggyback:700] Out: 2650 [Urine:2650] Total I/O In: 360 [P.O.:360] Out: 400 [Urine:400]     Consultants:   Pulm  Procedures: None  Antimicrobials:  Rocephin and Zithromax.  Subjective: Patient still has some short of breath with exertion. Currently on 3 L oxygen. Cough, nonproductive. No fever chills per No chest pain palpitation. No abdominal pain nausea vomiting.  Objective: Vitals:   06/12/20 2355 06/13/20 0534 06/13/20 0752 06/13/20 1119  BP: 122/70 113/65 124/60 (!) 113/53  Pulse: 99 (!) 103 (!) 102 100  Resp: 18 20 19 18   Temp: 98.6 F (37 C) (!) 97.5 F (36.4 C) 98.5 F (36.9 C) 98.2 F (36.8 C)  TempSrc: Oral Oral Oral Oral  SpO2:  93% 94% 96%  Weight:  87.5 kg    Height:        Intake/Output Summary (Last 24 hours) at 06/13/2020 1157 Last data filed at 06/13/2020 1047 Gross per 24 hour  Intake 810 ml  Output 1950 ml  Net -1140 ml   Filed Weights   06/11/20 0346 06/12/20 0511 06/13/20 0534  Weight: 87.3 kg 88.4 kg 87.5 kg    Examination:  General exam: Appears calm and comfortable  Respiratory system: Decreased breathing  sounds. Respiratory effort normal. Cardiovascular system: S1 & S2 heard, RRR. No JVD, murmurs, rubs, gallops or clicks. No pedal edema. Gastrointestinal system: Abdomen is nondistended, soft and nontender. No organomegaly or masses felt. Normal bowel sounds heard. Central nervous system: Alert and oriented. No focal neurological deficits. Extremities: Symmetric  Skin: No rashes, lesions or ulcers Psychiatry: Judgement and insight appear normal. Mood & affect appropriate.     Data Reviewed: I have personally reviewed following labs and imaging studies  CBC: Recent Labs  Lab  06/09/20 2110 06/10/20 0518 06/11/20 0541 06/12/20 0411  WBC 31.1* 29.2* 20.8* 21.3*  NEUTROABS  --  27.3*  --   --   HGB 10.4* 10.5* 10.6* 10.0*  HCT 30.5* 31.1* 30.4* 29.4*  MCV 86.6 87.4 87.1 87.0  PLT 369 372 345 355   Basic Metabolic Panel: Recent Labs  Lab 06/09/20 2110 06/10/20 0518 06/11/20 0541 06/12/20 0411 06/13/20 0529  NA 123* 125* 123* 127* 125*  K 4.2 4.1 3.8 3.4* 4.1  CL 88* 93* 93* 96* 90*  CO2 24 24 23 23 23   GLUCOSE 109* 142* 96 93 135*  BUN 20 17 19 16 21   CREATININE 0.88 0.62 0.55* 0.50* 0.51*  CALCIUM 8.6* 8.6* 8.6* 8.2* 8.6*  MG  --  1.6* 1.5* 1.6* 2.0  PHOS  --  2.6 2.1* 2.7 3.3   GFR: Estimated Creatinine Clearance: 98.5 mL/min (A) (by C-G formula based on SCr of 0.51 mg/dL (L)). Liver Function Tests: Recent Labs  Lab 06/09/20 2110 06/10/20 0518 06/11/20 0541  AST 24 19 33  ALT 21 22 36  ALKPHOS 122 120 134*  BILITOT 1.1 0.7 0.7  PROT 6.7 7.1 6.3*  ALBUMIN 3.2* 3.2* 2.8*   No results for input(s): LIPASE, AMYLASE in the last 168 hours. No results for input(s): AMMONIA in the last 168 hours. Coagulation Profile: Recent Labs  Lab 06/09/20 2326  INR 1.4*   Cardiac Enzymes: Recent Labs  Lab 06/10/20 0518  CKTOTAL 74   BNP (last 3 results) No results for input(s): PROBNP in the last 8760 hours. HbA1C: No results for input(s): HGBA1C in the last 72 hours. CBG: No results for input(s): GLUCAP in the last 168 hours. Lipid Profile: No results for input(s): CHOL, HDL, LDLCALC, TRIG, CHOLHDL, LDLDIRECT in the last 72 hours. Thyroid Function Tests: No results for input(s): TSH, T4TOTAL, FREET4, T3FREE, THYROIDAB in the last 72 hours. Anemia Panel: No results for input(s): VITAMINB12, FOLATE, FERRITIN, TIBC, IRON, RETICCTPCT in the last 72 hours. Sepsis Labs: Recent Labs  Lab 06/09/20 2156 06/09/20 2326 06/10/20 0518 06/11/20 0541  PROCALCITON  --  0.84 1.02 1.05  LATICACIDVEN 1.4  --   --   --     Recent Results (from the  past 240 hour(s))  SARS Coronavirus 2 by RT PCR (hospital order, performed in [facility] hospital lab) Nasopharyngeal Nasopharyngeal Swab     Status: None   Collection Time: 06/09/20  9:02 PM   Specimen: Nasopharyngeal Swab  Result Value Ref Range Status   SARS Coronavirus 2 NEGATIVE NEGATIVE Final    Comment: (NOTE) SARS-CoV-2 target nucleic acids are NOT DETECTED.  The SARS-CoV-2 RNA is generally detectable in upper and lower respiratory specimens during the acute phase of infection. The lowest concentration of SARS-CoV-2 viral copies this assay can detect is 250 copies / mL. A negative result does not preclude SARS-CoV-2 infection and should not be used as the sole basis for treatment or other patient management decisions.  A negative  result may occur with improper specimen collection / handling, submission of specimen other than nasopharyngeal swab, presence of viral mutation(s) within the areas targeted by this assay, and inadequate number of viral copies (<250 copies / mL). A negative result must be combined with clinical observations, patient history, and epidemiological information.  Fact Sheet for Patients:   StrictlyIdeas.no  Fact Sheet for Healthcare Providers: BankingDealers.co.za  This test is not yet approved or  cleared by the [place] FDA and has been authorized for detection and/or diagnosis of SARS-CoV-2 by FDA under an Emergency Use Authorization (EUA).  This EUA will remain in effect (meaning this test can be used) for the duration of the COVID-19 declaration under Section 564(b)(1) of the Act, 21 U.S.C. section 360bbb-3(b)(1), unless the authorization is terminated or revoked sooner.  Performed at [facility], [address] [place], [place] [address]   Blood culture (routine x 2)     Status: None (Preliminary result)   Collection Time: 06/09/20  9:50 PM   Specimen: BLOOD  Result Value Ref  Range Status   Specimen Description BLOOD LEFT ANTECUBITAL  Final   Special Requests   Final    BOTTLES DRAWN AEROBIC AND ANAEROBIC Blood Culture results may not be optimal due to an inadequate volume of blood received in culture bottles   Culture   Final    NO GROWTH 4 DAYS Performed at [facility], [address] [place], [place] [address]    Report Status PENDING  Incomplete  Blood culture (routine x 2)     Status: None (Preliminary result)   Collection Time: 06/09/20  9:56 PM   Specimen: BLOOD  Result Value Ref Range Status   Specimen Description BLOOD RIGHT ANTECUBITAL  Final   Special Requests   Final    BOTTLES DRAWN AEROBIC AND ANAEROBIC Blood Culture results may not be optimal due to an inadequate volume of blood received in culture bottles   Culture   Final    NO GROWTH 4 DAYS Performed at [facility], [address] [place], [place] [address]    Report Status PENDING  Incomplete  Culture, sputum-assessment     Status: None   Collection Time: 06/10/20  1:00 PM   Specimen: Expectorated Sputum  Result Value Ref Range Status   Specimen Description EXPECTORATED SPUTUM  Final   Special Requests NONE  Final   Sputum evaluation   Final    THIS SPECIMEN IS ACCEPTABLE FOR SPUTUM CULTURE Performed at [facility], [address] [place], [place] [address]    Report Status 06/10/2020 FINAL  Final  Culture, respiratory     Status: None   Collection Time: 06/10/20  1:00 PM  Result Value Ref Range Status   Specimen Description   Final    EXPECTORATED SPUTUM Performed at [facility], [address] [place], [place] [address]    Special Requests   Final    NONE Reflexed from [id number] Performed at [facility] [address] [place], [place] [address]    Gram Stain   Final    FEW WBC PRESENT,BOTH PMN AND MONONUCLEAR MODERATE GRAM POSITIVE COCCI IN CLUSTERS RARE GRAM NEGATIVE RODS Performed at [facility], [place] [address], [place], [place] [address]    Culture MODERATE STAPHYLOCOCCUS AUREUS  Final   Report Status 06/13/2020 FINAL  Final   Organism ID, Bacteria STAPHYLOCOCCUS AUREUS  Final      Susceptibility   Staphylococcus aureus - MIC*  CIPROFLOXACIN >=8 RESISTANT Resistant     ERYTHROMYCIN <=0.25 SENSITIVE Sensitive     GENTAMICIN <=0.5 SENSITIVE Sensitive     OXACILLIN <=0.25 SENSITIVE Sensitive     TETRACYCLINE <=1 SENSITIVE Sensitive     VANCOMYCIN 1 SENSITIVE Sensitive     TRIMETH/SULFA <=10 SENSITIVE Sensitive     CLINDAMYCIN <=0.25 SENSITIVE Sensitive     RIFAMPIN <=0.5 SENSITIVE Sensitive     Inducible Clindamycin NEGATIVE Sensitive     * MODERATE STAPHYLOCOCCUS AUREUS  MRSA PCR Screening     Status: None   Collection Time: 06/11/20  9:57 AM   Specimen: Nasal Mucosa; Nasopharyngeal  Result Value Ref Range Status   MRSA by PCR NEGATIVE NEGATIVE Final    Comment:        The GeneXpert MRSA Assay (FDA approved for NASAL specimens only), is one component of a comprehensive MRSA colonization surveillance program. It is not intended to diagnose MRSA infection nor to guide or monitor treatment for MRSA infections. Performed at [facility], [place], [place] [address]   Respiratory (~20 pathogens) panel by PCR     Status: None   Collection Time: 06/11/20  7:14 PM   Specimen: Nasopharyngeal Swab; Respiratory  Result Value Ref Range Status   Adenovirus NOT DETECTED NOT DETECTED Final   Coronavirus 229E NOT DETECTED NOT DETECTED Final    Comment: (NOTE) The Coronavirus on the Respiratory Panel, DOES NOT test for the novel  Coronavirus (2019 nCoV)    Coronavirus HKU1 NOT DETECTED NOT DETECTED Final   Coronavirus NL63 NOT DETECTED NOT DETECTED Final   Coronavirus OC43 NOT DETECTED NOT DETECTED Final   Metapneumovirus NOT DETECTED NOT DETECTED Final   Rhinovirus / Enterovirus NOT DETECTED NOT DETECTED Final   Influenza A NOT DETECTED NOT DETECTED Final    Influenza B NOT DETECTED NOT DETECTED Final   Parainfluenza Virus 1 NOT DETECTED NOT DETECTED Final   Parainfluenza Virus 2 NOT DETECTED NOT DETECTED Final   Parainfluenza Virus 3 NOT DETECTED NOT DETECTED Final   Parainfluenza Virus 4 NOT DETECTED NOT DETECTED Final   Respiratory Syncytial Virus NOT DETECTED NOT DETECTED Final   Bordetella pertussis NOT DETECTED NOT DETECTED Final   Bordetella Parapertussis NOT DETECTED NOT DETECTED Final   Chlamydophila pneumoniae NOT DETECTED NOT DETECTED Final   Mycoplasma pneumoniae NOT DETECTED NOT DETECTED Final    Comment: Performed at [facility], [place]. [address], [place], [place] [address]         Radiology Studies: No results found.      Scheduled Meds: . atorvastatin  40 mg Oral Daily  . enoxaparin (LOVENOX) injection  40 mg Subcutaneous Q24H  . furosemide  20 mg Intravenous Daily  . guaiFENesin  600 mg Oral BID  . ipratropium-albuterol  3 mL Nebulization Q4H  . montelukast  10 mg Oral QHS  . nicotine  21 mg Transdermal Daily  . pantoprazole  20 mg Oral Daily  . predniSONE  30 mg Oral Q breakfast  . sodium chloride  1 g Oral BID WC   Continuous Infusions: . azithromycin Stopped (06/12/20 2229)  . cefTRIAXone (ROCEPHIN)  IV 200 mL/hr at 06/12/20 2353     LOS: 4 days    Time spent: 28 minutes    Sharen Hones, MD [facility]   To contact the attending provider between 7A-7P or the covering provider during after hours 7P-7A, please log into the web site [email or web address] and access using universal [place] password for  that web site. If you do not have the password, please call the hospital operator.  06/13/2020, 11:57 AM

## 2020-06-13 NOTE — Progress Notes (Signed)
Physical Therapy Treatment Patient Details Name: Jeremiah [name] MRN: [id number] DOB: 12/10/1950 Today's Date: 06/13/2020    History of Present Illness presented to ER secondary to progressive SOB, cough x2-3 days; admitted for management of acute/chronic respiratory failure with hypoxia due to COPD exacerbation.  Of note, recently hospitalized (Jan, 2021) due to R foot cellulitis, covid +.    PT Comments    Pt ready for gait.  4 lpm at rest.  sats mid 90's.  Bed mobility without assist.  Stood and is able to walk 36' with RW and min guard/supervision with assist to manage O2.  He does take and extended standing rest to monitor and recover O2 sats at nursing desk.  Slowly recovers to 90 on 4 lpm and is increased to 6 lpm to return to room due to fatigue.  He opts to use [facility] upon return and is left with call bell in place and RN aware.  Pt is refusing SNF and stated he will return home.  He is doing well with gait but remains on increased O2 needs with slow recovery.  He stated his friend is looking for equipment and will let us know if he wants suggested DME below.   Follow Up Recommendations  Home health PT     Equipment Recommendations  Rolling walker with 5" wheels;3in1 (PT)    Recommendations for Other Services       Precautions / Restrictions Precautions Precautions: Fall Precaution Comments: foot wounds Restrictions Weight Bearing Restrictions: No Other Position/Activity Restrictions: b/l foot wounds and terribly long toe nails    Mobility  Bed Mobility   Bed Mobility: Supine to Sit     Supine to sit: Supervision;HOB elevated        Transfers Overall transfer level: Needs assistance Equipment used: Rolling walker (2 wheeled) Transfers: Sit to/from Stand Sit to Stand: Supervision         General transfer comment: Pt agreeable to use of RW for mobility.  Ambulation/Gait Ambulation/Gait assistance: Min guard Gait Distance (Feet): 70 Feet Assistive  device: Rolling walker (2 wheeled) Gait Pattern/deviations: Trunk flexed;Shuffle     General Gait Details: generally steady but limited by O2 sats - rested on nursing station counter for several minutes before continuing back to room to regain sats.   Stairs             Wheelchair Mobility    Modified Rankin (Stroke Patients Only)       Balance Overall balance assessment: Needs assistance Sitting-balance support: Feet supported;No upper extremity supported Sitting balance-Leahy Scale: Good     Standing balance support: Bilateral upper extremity supported Standing balance-Leahy Scale: Fair Standing balance comment: benefits from UE support.                            Cognition Arousal/Alertness: Awake/alert Behavior During Therapy: WFL for tasks assessed/performed Overall Cognitive Status: Within Functional Limits for tasks assessed                                        Exercises      General Comments        Pertinent Vitals/Pain Pain Assessment: No/denies pain    Home Living                      Prior Function  PT Goals (current goals can now be found in the care plan section) Progress towards PT goals: Progressing toward goals    Frequency    Min 2X/week      PT Plan Current plan remains appropriate    Co-evaluation              AM-PAC PT "6 Clicks" Mobility   Outcome Measure  Help needed turning from your back to your side while in a flat bed without using bedrails?: None Help needed moving from lying on your back to sitting on the side of a flat bed without using bedrails?: None Help needed moving to and from a bed to a chair (including a wheelchair)?: A Little Help needed standing up from a chair using your arms (e.g., wheelchair or bedside chair)?: A Little Help needed to walk in hospital room?: A Little Help needed climbing 3-5 steps with a railing? : A Little 6 Click Score:  20    End of Session Equipment Utilized During Treatment: Oxygen;Gait belt Activity Tolerance: Patient limited by fatigue Patient left: Other (comment);with call bell/phone within reach Nurse Communication: Mobility status;Other (comment)       Time: 8115-7262 PT Time Calculation (min) (ACUTE ONLY): 11 min  Charges:  $Gait Training: 8-22 mins                    Chesley Noon, PTA 06/13/20, 12:21 PM

## 2020-06-14 DIAGNOSIS — R6520 Severe sepsis without septic shock: Secondary | ICD-10-CM | POA: Diagnosis not present

## 2020-06-14 DIAGNOSIS — J449 Chronic obstructive pulmonary disease, unspecified: Secondary | ICD-10-CM | POA: Diagnosis not present

## 2020-06-14 DIAGNOSIS — J9621 Acute and chronic respiratory failure with hypoxia: Secondary | ICD-10-CM | POA: Diagnosis not present

## 2020-06-14 DIAGNOSIS — A419 Sepsis, unspecified organism: Secondary | ICD-10-CM | POA: Diagnosis not present

## 2020-06-14 LAB — CBC WITH DIFFERENTIAL/PLATELET
Abs Immature Granulocytes: 0.77 10*3/uL — ABNORMAL HIGH (ref 0.00–0.07)
Basophils Absolute: 0.1 10*3/uL (ref 0.0–0.1)
Basophils Relative: 0 %
Eosinophils Absolute: 0.1 10*3/uL (ref 0.0–0.5)
Eosinophils Relative: 1 %
HCT: 29.8 % — ABNORMAL LOW (ref 39.0–52.0)
Hemoglobin: 10.3 g/dL — ABNORMAL LOW (ref 13.0–17.0)
Immature Granulocytes: 4 %
Lymphocytes Relative: 12 %
Lymphs Abs: 2.4 10*3/uL (ref 0.7–4.0)
MCH: 29.6 pg (ref 26.0–34.0)
MCHC: 34.6 g/dL (ref 30.0–36.0)
MCV: 85.6 fL (ref 80.0–100.0)
Monocytes Absolute: 1.7 10*3/uL — ABNORMAL HIGH (ref 0.1–1.0)
Monocytes Relative: 8 %
Neutro Abs: 15.5 10*3/uL — ABNORMAL HIGH (ref 1.7–7.7)
Neutrophils Relative %: 75 %
Platelets: 418 10*3/uL — ABNORMAL HIGH (ref 150–400)
RBC: 3.48 MIL/uL — ABNORMAL LOW (ref 4.22–5.81)
RDW: 15.3 % (ref 11.5–15.5)
WBC: 20.5 10*3/uL — ABNORMAL HIGH (ref 4.0–10.5)
nRBC: 0 % (ref 0.0–0.2)

## 2020-06-14 LAB — CULTURE, BLOOD (ROUTINE X 2)
Culture: NO GROWTH
Culture: NO GROWTH

## 2020-06-14 LAB — BASIC METABOLIC PANEL
Anion gap: 9 (ref 5–15)
BUN: 23 mg/dL (ref 8–23)
CO2: 27 mmol/L (ref 22–32)
Calcium: 8.5 mg/dL — ABNORMAL LOW (ref 8.9–10.3)
Chloride: 93 mmol/L — ABNORMAL LOW (ref 98–111)
Creatinine, Ser: 0.63 mg/dL (ref 0.61–1.24)
GFR, Estimated: >60 mL/min (ref >60)
Glucose, Bld: 146 mg/dL — ABNORMAL HIGH (ref 70–99)
Potassium: 4.2 mmol/L (ref 3.5–5.1)
Sodium: 129 mmol/L — ABNORMAL LOW (ref 135–145)

## 2020-06-14 LAB — MAGNESIUM: Magnesium: 1.9 mg/dL (ref 1.7–2.4)

## 2020-06-14 LAB — PHOSPHORUS: Phosphorus: 3.5 mg/dL (ref 2.5–4.6)

## 2020-06-14 LAB — PROCALCITONIN: Procalcitonin: 0.23 ng/mL

## 2020-06-14 MED ORDER — FUROSEMIDE 40 MG PO TABS
40.0000 mg | ORAL_TABLET | Freq: Once | ORAL | Status: AC
  Administered 2020-06-14: 40 mg via ORAL
  Filled 2020-06-14: qty 1

## 2020-06-14 MED ORDER — MOMETASONE FURO-FORMOTEROL FUM 100-5 MCG/ACT IN AERO
2.0000 | INHALATION_SPRAY | Freq: Two times a day (BID) | RESPIRATORY_TRACT | Status: DC
  Administered 2020-06-14 – 2020-06-17 (×7): 2 via RESPIRATORY_TRACT
  Filled 2020-06-14: qty 8.8

## 2020-06-14 MED ORDER — SODIUM CHLORIDE 0.9% FLUSH
10.0000 mL | Freq: Two times a day (BID) | INTRAVENOUS | Status: DC
  Administered 2020-06-14 – 2020-06-17 (×6): 10 mL via INTRAVENOUS

## 2020-06-14 MED ORDER — TIOTROPIUM BROMIDE MONOHYDRATE 18 MCG IN CAPS
18.0000 ug | ORAL_CAPSULE | Freq: Every day | RESPIRATORY_TRACT | Status: DC
  Administered 2020-06-14 – 2020-06-15 (×2): 18 ug via RESPIRATORY_TRACT
  Filled 2020-06-14: qty 5

## 2020-06-14 MED ORDER — PREDNISONE 20 MG PO TABS
30.0000 mg | ORAL_TABLET | Freq: Every day | ORAL | Status: DC
  Administered 2020-06-15: 30 mg via ORAL
  Filled 2020-06-14: qty 1

## 2020-06-14 NOTE — Care Management Important Message (Signed)
Important Message  Patient Details  Name: Jeremiah [name] MRN: [id number] Date of Birth: [birth year]   Medicare Important Message Given:  Yes     Dannette Barbara 06/14/2020, 11:42 AM

## 2020-06-14 NOTE — Progress Notes (Signed)
PROGRESS NOTE    Jeremiah [name]  YTK:[id number] DOB: 11-29-1950 DOA: 06/09/2020 PCP: System, Provider Not In   Chief Complaint.  Shortness of breath Brief Narrative:  This patient is 70 years old male with PMH significant for COPD on home oxygen at 2 L, hypertension, tobacco abuse, hyponatremia, GERD, history of recent Covid infection presents in the emergency department with productive cough and shortness of breath. He is found to be hypoxic, requiring 6 L of oxygen to maintain saturation above 91%, found to have leukocytosis. He was hospitalized last month for Covid pneumonia and was treated with remdesivir, steroids bronchodilators and has completed treatment while hospitalized. Patient is admitted for sepsis secondary to multifocal pneumonia and acute on chronic hypoxic respiratory failure requiring 6 L of oxygen.    Assessment & Plan:   Active Problems:   Acute on chronic respiratory failure with hypoxia (HCC)   Tobacco dependency   COPD with chronic bronchitis (HCC)   Hyponatremia   Sepsis ([place])   HTN (hypertension)  #1 Severe sepsis secondary to pneumonia. Acute on chronic hypoxemic respiratory failure secondary to pneumonia Multifocal pneumonia Patient felt more short of breath last night, currently still on 3 L oxygen. Procalcitonin level has dropped down, antibiotics are effective. Patient still has significant bronchospasm, mainly from COPD exacerbation. Continue oral steroids. Add scheduled Dulera and Spiriva.  #2.  Hyponatremia Sodium level still low, but better.  Started fluid restriction.  Also give a dose of 40 mg oral Lasix.  #3. Elevated troponin secondary to demand ischemia due to sepsis. Stable.    DVT prophylaxis: Lovenox Code Status: [name] Family Communication: No family to call per patient. Disposition Plan:  .   Status is: Inpatient  Remains inpatient appropriate because:Inpatient level of care appropriate due to severity of  illness   Dispo: The patient is from: Home              Anticipated d/c is to: Home              Anticipated d/c date is: 1 day              Patient currently is not medically stable to d/c.   Difficult to place patient No        I/O last 3 completed shifts: In: 1050 [P.O.:600; IV Piggyback:450] Out: 3000 [Urine:3000] No intake/output data recorded.     Consultants:   Pulm  Procedures: None  Antimicrobials:  Rocephin and Zithromax.  Subjective: Patient had more short of breath last night, some wheezing in the morning.  Cough, nonproductive. No fever or chills. No abdominal pain nausea vomiting. No chest pain or palpitation.  Objective: Vitals:   06/13/20 1950 06/13/20 1956 06/14/20 0407 06/14/20 0731  BP: 135/76  130/66 104/63  Pulse: 98  92 90  Resp: 17  17 18   Temp: 98 F (36.7 C)  98.1 F (36.7 C) 97.9 F (36.6 C)  TempSrc:      SpO2: 95% 95% 94% 94%  Weight:   87.3 kg   Height:        Intake/Output Summary (Last 24 hours) at 06/14/2020 0846 Last data filed at 06/13/2020 2007 Gross per 24 hour  Intake 600 ml  Output 1050 ml  Net -450 ml   Filed Weights   06/12/20 0511 06/13/20 0534 06/14/20 0407  Weight: 88.4 kg 87.5 kg 87.3 kg    Examination:  General exam: Appears calm and comfortable  Respiratory system: Decreased breathing sounds with mild wheezes. Respiratory effort  normal. Cardiovascular system: S1 & S2 heard, RRR. No JVD, murmurs, rubs, gallops or clicks. No pedal edema. Gastrointestinal system: Abdomen is nondistended, soft and nontender. No organomegaly or masses felt. Normal bowel sounds heard. Central nervous system: Alert and oriented. No focal neurological deficits. Extremities: Symmetric 5 x 5 power. Skin: No rashes, lesions or ulcers Psychiatry: Judgement and insight appear normal. Mood & affect appropriate.     Data Reviewed: I have personally reviewed following labs and imaging studies  CBC: Recent Labs  Lab  06/09/20 2110 06/10/20 0518 06/11/20 0541 06/12/20 0411 06/14/20 0506  WBC 31.1* 29.2* 20.8* 21.3* 20.5*  NEUTROABS  --  27.3*  --   --  15.5*  HGB 10.4* 10.5* 10.6* 10.0* 10.3*  HCT 30.5* 31.1* 30.4* 29.4* 29.8*  MCV 86.6 87.4 87.1 87.0 85.6  PLT 369 372 345 372 389*   Basic Metabolic Panel: Recent Labs  Lab 06/10/20 0518 06/11/20 0541 06/12/20 0411 06/13/20 0529 06/14/20 0506  NA 125* 123* 127* 125* 129*  K 4.1 3.8 3.4* 4.1 4.2  CL 93* 93* 96* 90* 93*  CO2 24 23 23 23 27   GLUCOSE 142* 96 93 135* 146*  BUN 17 19 16 21 23   CREATININE 0.62 0.55* 0.50* 0.51* 0.63  CALCIUM 8.6* 8.6* 8.2* 8.6* 8.5*  MG 1.6* 1.5* 1.6* 2.0 1.9  PHOS 2.6 2.1* 2.7 3.3 3.5   GFR: Estimated Creatinine Clearance: 98.5 mL/min (by C-G formula based on SCr of 0.63 mg/dL). Liver Function Tests: Recent Labs  Lab 06/09/20 2110 06/10/20 0518 06/11/20 0541  AST 24 19 33  ALT 21 22 36  ALKPHOS 122 120 134*  BILITOT 1.1 0.7 0.7  PROT 6.7 7.1 6.3*  ALBUMIN 3.2* 3.2* 2.8*   No results for input(s): LIPASE, AMYLASE in the last 168 hours. No results for input(s): AMMONIA in the last 168 hours. Coagulation Profile: Recent Labs  Lab 06/09/20 2326  INR 1.4*   Cardiac Enzymes: Recent Labs  Lab 06/10/20 0518  CKTOTAL 74   BNP (last 3 results) No results for input(s): PROBNP in the last 8760 hours. HbA1C: No results for input(s): HGBA1C in the last 72 hours. CBG: No results for input(s): GLUCAP in the last 168 hours. Lipid Profile: No results for input(s): CHOL, HDL, LDLCALC, TRIG, CHOLHDL, LDLDIRECT in the last 72 hours. Thyroid Function Tests: No results for input(s): TSH, T4TOTAL, FREET4, T3FREE, THYROIDAB in the last 72 hours. Anemia Panel: No results for input(s): VITAMINB12, FOLATE, FERRITIN, TIBC, IRON, RETICCTPCT in the last 72 hours. Sepsis Labs: Recent Labs  Lab 06/09/20 2156 06/09/20 2326 06/10/20 0518 06/11/20 0541 06/14/20 0506  PROCALCITON  --  0.84 1.02 1.05 0.23   LATICACIDVEN 1.4  --   --   --   --     Recent Results (from the past 240 hour(s))  SARS Coronavirus 2 by RT PCR (hospital order, performed in [facility] hospital lab) Nasopharyngeal Nasopharyngeal Swab     Status: None   Collection Time: 06/09/20  9:02 PM   Specimen: Nasopharyngeal Swab  Result Value Ref Range Status   SARS Coronavirus 2 NEGATIVE NEGATIVE Final    Comment: (NOTE) SARS-CoV-2 target nucleic acids are NOT DETECTED.  The SARS-CoV-2 RNA is generally detectable in upper and lower respiratory specimens during the acute phase of infection. The lowest concentration of SARS-CoV-2 viral copies this assay can detect is 250 copies / mL. A negative result does not preclude SARS-CoV-2 infection and should not be used as the sole basis for treatment  or other patient management decisions.  A negative result may occur with improper specimen collection / handling, submission of specimen other than nasopharyngeal swab, presence of viral mutation(s) within the areas targeted by this assay, and inadequate number of viral copies (<250 copies / mL). A negative result must be combined with clinical observations, patient history, and epidemiological information.  Fact Sheet for Patients:   StrictlyIdeas.no  Fact Sheet for Healthcare Providers: BankingDealers.co.za  This test is not yet approved or  cleared by the [place] FDA and has been authorized for detection and/or diagnosis of SARS-CoV-2 by FDA under an Emergency Use Authorization (EUA).  This EUA will remain in effect (meaning this test can be used) for the duration of the COVID-19 declaration under Section 564(b)(1) of the Act, 21 U.S.C. section 360bbb-3(b)(1), unless the authorization is terminated or revoked sooner.  Performed at [facility], [address] [place], [place] [address]   Blood culture (routine x 2)     Status: None   Collection Time: 06/09/20   9:50 PM   Specimen: BLOOD  Result Value Ref Range Status   Specimen Description BLOOD LEFT ANTECUBITAL  Final   Special Requests   Final    BOTTLES DRAWN AEROBIC AND ANAEROBIC Blood Culture results may not be optimal due to an inadequate volume of blood received in culture bottles   Culture   Final    NO GROWTH 5 DAYS Performed at [facility], [address] [place], [place] [address]    Report Status 06/14/2020 FINAL  Final  Blood culture (routine x 2)     Status: None   Collection Time: 06/09/20  9:56 PM   Specimen: BLOOD  Result Value Ref Range Status   Specimen Description BLOOD RIGHT ANTECUBITAL  Final   Special Requests   Final    BOTTLES DRAWN AEROBIC AND ANAEROBIC Blood Culture results may not be optimal due to an inadequate volume of blood received in culture bottles   Culture   Final    NO GROWTH 5 DAYS Performed at [facility], [address], [place], [place] [address]    Report Status 06/14/2020 FINAL  Final  Culture, sputum-assessment     Status: None   Collection Time: 06/10/20  1:00 PM   Specimen: Expectorated Sputum  Result Value Ref Range Status   Specimen Description EXPECTORATED SPUTUM  Final   Special Requests NONE  Final   Sputum evaluation   Final    THIS SPECIMEN IS ACCEPTABLE FOR SPUTUM CULTURE Performed at [facility], [address] [place], [place] [address]    Report Status 06/10/2020 FINAL  Final  Culture, respiratory     Status: None   Collection Time: 06/10/20  1:00 PM  Result Value Ref Range Status   Specimen Description   Final    EXPECTORATED SPUTUM Performed at [facility], [address] [place], [place] [address]    Special Requests   Final    NONE Reflexed from [id number] Performed at [facility], [address] [place], [place] [address]    Gram Stain   Final    FEW WBC PRESENT,BOTH PMN AND MONONUCLEAR MODERATE GRAM POSITIVE COCCI IN CLUSTERS RARE GRAM NEGATIVE RODS Performed  at [facility], [place] [address], [place], [place] [address]    Culture MODERATE STAPHYLOCOCCUS AUREUS  Final   Report Status 06/13/2020 FINAL  Final   Organism ID, Bacteria STAPHYLOCOCCUS AUREUS  Final      Susceptibility  Staphylococcus aureus - MIC*    CIPROFLOXACIN >=8 RESISTANT Resistant     ERYTHROMYCIN <=0.25 SENSITIVE Sensitive     GENTAMICIN <=0.5 SENSITIVE Sensitive     OXACILLIN <=0.25 SENSITIVE Sensitive     TETRACYCLINE <=1 SENSITIVE Sensitive     VANCOMYCIN 1 SENSITIVE Sensitive     TRIMETH/SULFA <=10 SENSITIVE Sensitive     CLINDAMYCIN <=0.25 SENSITIVE Sensitive     RIFAMPIN <=0.5 SENSITIVE Sensitive     Inducible Clindamycin NEGATIVE Sensitive     * MODERATE STAPHYLOCOCCUS AUREUS  MRSA PCR Screening     Status: None   Collection Time: 06/11/20  9:57 AM   Specimen: Nasal Mucosa; Nasopharyngeal  Result Value Ref Range Status   MRSA by PCR NEGATIVE NEGATIVE Final    Comment:        The GeneXpert MRSA Assay (FDA approved for NASAL specimens only), is one component of a comprehensive MRSA colonization surveillance program. It is not intended to diagnose MRSA infection nor to guide or monitor treatment for MRSA infections. Performed at [facility], [place], [place] [address]   Respiratory (~20 pathogens) panel by PCR     Status: None   Collection Time: 06/11/20  7:14 PM   Specimen: Nasopharyngeal Swab; Respiratory  Result Value Ref Range Status   Adenovirus NOT DETECTED NOT DETECTED Final   Coronavirus 229E NOT DETECTED NOT DETECTED Final    Comment: (NOTE) The Coronavirus on the Respiratory Panel, DOES NOT test for the novel  Coronavirus (2019 nCoV)    Coronavirus HKU1 NOT DETECTED NOT DETECTED Final   Coronavirus NL63 NOT DETECTED NOT DETECTED Final   Coronavirus OC43 NOT DETECTED NOT DETECTED Final   Metapneumovirus NOT DETECTED NOT DETECTED Final   Rhinovirus / Enterovirus NOT DETECTED NOT DETECTED Final   Influenza A  NOT DETECTED NOT DETECTED Final   Influenza B NOT DETECTED NOT DETECTED Final   Parainfluenza Virus 1 NOT DETECTED NOT DETECTED Final   Parainfluenza Virus 2 NOT DETECTED NOT DETECTED Final   Parainfluenza Virus 3 NOT DETECTED NOT DETECTED Final   Parainfluenza Virus 4 NOT DETECTED NOT DETECTED Final   Respiratory Syncytial Virus NOT DETECTED NOT DETECTED Final   Bordetella pertussis NOT DETECTED NOT DETECTED Final   Bordetella Parapertussis NOT DETECTED NOT DETECTED Final   Chlamydophila pneumoniae NOT DETECTED NOT DETECTED Final   Mycoplasma pneumoniae NOT DETECTED NOT DETECTED Final    Comment: Performed at [facility], [place]. [address], [place], [place] [address]         Radiology Studies: No results found.      Scheduled Meds: . atorvastatin  40 mg Oral Daily  . doxycycline  100 mg Oral Q12H  . enoxaparin (LOVENOX) injection  40 mg Subcutaneous Q24H  . furosemide  20 mg Intravenous Daily  . furosemide  40 mg Oral Once  . guaiFENesin  600 mg Oral BID  . mometasone-formoterol  2 puff Inhalation BID  . montelukast  10 mg Oral QHS  . nicotine  21 mg Transdermal Daily  . pantoprazole  20 mg Oral Daily  . predniSONE  20 mg Oral Q breakfast  . tiotropium  18 mcg Inhalation Daily   Continuous Infusions:   LOS: 5 days    Time spent: 28 minutes    Sharen Hones, MD [facility]   To contact the attending provider between 7A-7P or the covering provider during after hours 7P-7A, please log into the web site [email or web address] and access using universal [facility] password for that web site. If you do not have the password, please call the hospital operator.  06/14/2020, 8:46 AM

## 2020-06-14 NOTE — Progress Notes (Signed)
   Pulmonary Medicine          Date: 06/14/2020,   MRN# [id number] Jeremiah [name] 01/19/1951     AdmissionWeight: 87.5 kg                 CurrentWeight: 87.3 kg   Referring physician: Dr Kumar   CHIEF COMPLAINT:   Multifocal pneumonia with acute on chronic hypoxemic respiratory failure.    HISTORY OF PRESENT ILLNESS   As per admission h/p Jeremiah [name] is a 69 y.o. male with medical history significant of COPD on home oxygen at 2 L with, HTN, tobacco abuse, hyponatremia, GERD, history of recent Covid infection.  Presented with cough and shortness of breath For the past 2 to 3 days have been having cough productive of thick sputum Noted to have elevated white blood cell count and worsening hypoxia requiring up to 6 L to maintain oxygen saturation 91%  Patient tested Covid positive about 1 month ago Last month was admitted with sepsis secondary to right foot cellulitis.  Was treated with IV vancomycin and meropenem.  Patient was found to be Covid positive on 7 January. Found to have Covid pneumonia treated for remdesivir steroids bronchodilators and completed his treatment while hospitalized.  He was discharged to SNF. Serial CXR reviewed by me shows flattening of hemidiaphragms bilaterally with interstitial opacification suugestive of chronic lung disease with component of pulmonary edema.    06/12/2020- patient feels better this am, he is weaned to 4L/min [place]. He has PT coming in today and he is excited to get OOB and ambulate. I have encouraged him to try 70ft walking today.  From pulmonary perspective can initiate dc planning  06/13/2020-  Patient +resistant staph aureus likely etiology of his community acquired pneumonia.  He ambulated with PT today.  O2 weaned to 3L/min.  Na level is fluctuated still low   06/14/2020- patient is worse today, he is wheezing on exam and his O2 req is increased to 4L from 3 overnight.  He states he can hear more wheezing, and this  can happen with tapering of prednisone.  I will increase pred back to 30mg from 20 today. We will repeat CXRE today since its been 5 days since last one. .   PAST MEDICAL HISTORY   Past Medical History:  Diagnosis Date  . Asthma   . COPD (chronic obstructive pulmonary disease) (HCC)    O2 dependent - 2L  . GERD (gastroesophageal reflux disease)   . HTN (hypertension)   . Oxygen dependent   . Tobacco dependence      SURGICAL HISTORY   Past Surgical History:  Procedure Laterality Date  . CATARACT EXTRACTION W/PHACO Left 12/09/2019   Procedure: CATARACT EXTRACTION PHACO AND INTRAOCULAR LENS PLACEMENT (IOC) COMPLICATED LEFT 11.21 01:03.8;  Surgeon: Porfilio, William, MD;  Location: [facility];  Service: Ophthalmology;  Laterality: Left;  MILOOP VISION BLUE  . CATARACT EXTRACTION W/PHACO Right 01/27/2020   Procedure: CATARACT EXTRACTION PHACO AND INTRAOCULAR LENS PLACEMENT (IOC) RIGHT VISION BLUE 26.83 02:07.6;  Surgeon: Porfilio, William, MD;  Location: [facility];  Service: Ophthalmology;  Laterality: Right;  . HERNIA REPAIR    . NECK SURGERY       FAMILY HISTORY   Family History  Problem Relation Age of Onset  . Cancer - Colon Father   . Congestive Heart Failure Mother      SOCIAL HISTORY   Social History   Tobacco Use  . Smoking status: Current Every Day   Smoker    Packs/day: 0.50    Years: 50.00    Pack years: 25.00    Types: Cigarettes    Last attempt to quit: 07/28/2015    Years since quitting: 4.8  . Smokeless tobacco: Never Used  . Tobacco comment: started age 67.  Vaping Use  . Vaping Use: Never used  Substance Use Topics  . Alcohol use: Yes    Alcohol/week: 0.0 standard drinks    Comment: beers occasionally  . Drug use: No     MEDICATIONS    Home Medication:    Current Medication:  Current Facility-Administered Medications:  .  acetaminophen (TYLENOL) tablet 650 mg, 650 mg, Oral, Q6H PRN, 650 mg at 06/14/20 1039 **OR**  acetaminophen (TYLENOL) suppository 650 mg, 650 mg, Rectal, Q6H PRN, Doutova, Anastassia, MD .  albuterol (PROVENTIL) (2.5 MG/3ML) 0.083% nebulizer solution 2.5 mg, 2.5 mg, Nebulization, Q2H PRN, Doutova, Anastassia, MD, 2.5 mg at 06/13/20 1435 .  albuterol (VENTOLIN HFA) 108 (90 Base) MCG/ACT inhaler 2 puff, 2 puff, Inhalation, Q4H PRN, Doutova, Anastassia, MD .  atorvastatin (LIPITOR) tablet 40 mg, 40 mg, Oral, Daily, Doutova, Anastassia, MD, 40 mg at 06/14/20 0842 .  doxycycline (VIBRA-TABS) tablet 100 mg, 100 mg, Oral, Q12H, Lanney Gins, Osmany Azer, MD, 100 mg at 06/14/20 0841 .  enoxaparin (LOVENOX) injection 40 mg, 40 mg, Subcutaneous, Q24H, Doutova, Anastassia, MD, 40 mg at 06/14/20 0843 .  furosemide (LASIX) injection 20 mg, 20 mg, Intravenous, Daily, Ottie Glazier, MD, 20 mg at 06/14/20 0843 .  guaiFENesin (MUCINEX) 12 hr tablet 600 mg, 600 mg, Oral, BID, Doutova, Anastassia, MD, 600 mg at 06/14/20 0842 .  mometasone-formoterol (DULERA) 100-5 MCG/ACT inhaler 2 puff, 2 puff, Inhalation, BID, Sharen Hones, MD, 2 puff at 06/14/20 1040 .  montelukast (SINGULAIR) tablet 10 mg, 10 mg, Oral, QHS, Doutova, Anastassia, MD, 10 mg at 06/14/20 0842 .  nicotine (NICODERM CQ - dosed in mg/24 hours) patch 21 mg, 21 mg, Transdermal, Daily, Doutova, Anastassia, MD, 21 mg at 06/14/20 0842 .  pantoprazole (PROTONIX) EC tablet 20 mg, 20 mg, Oral, Daily, Doutova, Anastassia, MD, 20 mg at 06/14/20 0842 .  [START ON 06/15/2020] predniSONE (DELTASONE) tablet 30 mg, 30 mg, Oral, Q breakfast, Lanney Gins, Seleen Walter, MD .  tiotropium (SPIRIVA) inhalation capsule ([facility] use ONLY) 18 mcg, 18 mcg, Inhalation, Daily, Sharen Hones, MD, 18 mcg at 06/14/20 1039    ALLERGIES   Penicillins     REVIEW OF SYSTEMS    Review of Systems:  Gen:  Denies  fever, sweats, chills weigh loss  HEENT: Denies blurred vision, double vision, ear pain, eye pain, hearing loss, nose bleeds, sore throat Cardiac:  No dizziness, chest pain or  heaviness, chest tightness,edema Resp:  Complains of cough rpductive of phlegm Gi: Denies swallowing difficulty, stomach pain, nausea or vomiting, diarrhea, constipation, bowel incontinence Gu:  Denies bladder incontinence, burning urine Ext:   Denies Joint pain, stiffness or swelling Skin: Denies  skin rash, easy bruising or bleeding or hives Endoc:  Denies polyuria, polydipsia , polyphagia or weight change Psych:   Denies depression, insomnia or hallucinations   Other:  All other systems negative   VS: BP (!) 150/74   Pulse (!) 116   Temp 98.1 F (36.7 C)   Resp 20   Ht 6' 1" (1.854 m)   Wt 87.3 kg   SpO2 94%   BMI 25.39 kg/m      PHYSICAL EXAM    GENERAL:NAD, no fevers, chills, no weakness no fatigue HEAD: Normocephalic,  atraumatic.  EYES: Pupils equal, round, reactive to light. Extraocular muscles intact. No scleral icterus.  MOUTH: Moist mucosal membrane. Dentition intact. No abscess noted.  EAR, NOSE, THROAT: Clear without exudates. No external lesions.  NECK: Supple. No thyromegaly. No nodules. No JVD.  PULMONARY: bilateral rhochi imporved from previous CARDIOVASCULAR: S1 and S2. Regular rate and rhythm. No murmurs, rubs, or gallops. No edema. Pedal pulses 2+ bilaterally.  GASTROINTESTINAL: Soft, nontender, nondistended. No masses. Positive bowel sounds. No hepatosplenomegaly.  MUSCULOSKELETAL: No swelling, clubbing, or edema. Range of motion full in all extremities.  NEUROLOGIC: Cranial nerves II through XII are intact. No gross focal neurological deficits. Sensation intact. Reflexes intact.  SKIN: No ulceration, lesions, rashes, or cyanosis. Skin warm and dry. Turgor intact.  PSYCHIATRIC: Mood, affect within normal limits. The patient is awake, alert and oriented x 3. Insight, judgment intact.       IMAGING    MR FOOT RIGHT WO CONTRAST  Result Date: 05/16/2020 CLINICAL DATA:  Right foot swelling and redness. EXAM: MRI OF THE RIGHT FOREFOOT WITHOUT CONTRAST  TECHNIQUE: Multiplanar, multisequence MR imaging of the right foot was performed. No intravenous contrast was administered. COMPARISON:  Right foot x-rays dated May 14, 2020. FINDINGS: Bones/Joint/Cartilage No marrow signal abnormality. No acute fracture or dislocation. Old healed fracture of the base of the fifth metatarsal. Hallux valgus deformity. Mild first MTP joint osteoarthritis with small joint effusion. Mild midfoot osteoarthritis. Ligaments Collateral ligaments are intact. Muscles and Tendons Flexor and extensor tendons are intact. No tenosynovitis. Diffuse fatty atrophy of the intrinsic foot muscles. Soft tissue Diffuse soft tissue swelling. No fluid collection or hematoma. No soft tissue mass. IMPRESSION: 1. Diffuse soft tissue swelling, likely reflecting cellulitis. No abscess or osteomyelitis. Electronically Signed   By: William T Derry M.D.   On: 05/16/2020 09:21   DG Chest Portable 1 View  Result Date: 06/09/2020 CLINICAL DATA:  Shortness of breath EXAM: PORTABLE CHEST 1 VIEW COMPARISON:  May 14, 2020 FINDINGS: The heart size and mediastinal contours are mildly enlarged. Aortic knob calcifications are seen. Multifocal patchy interstitial opacities are seen throughout lower lungs, right greater than left. Biapical scarring is noted. No acute osseous abnormality. IMPRESSION: Multifocal airspace opacities, right greater than left, likely consistent with multifocal pneumonia Electronically Signed   By: Bindu  Avutu M.D.   On: 06/09/2020 21:26   ECHOCARDIOGRAM COMPLETE  Result Date: 06/11/2020    ECHOCARDIOGRAM REPORT   Patient Name:   Julyan Natoma Mccowan Date of Exam: 06/11/2020 Medical Rec #:  [id number]           Height:       73.0 in Accession #:    [id number]          Weight:       192.4 lb Date of Birth:  04/22/1951           BSA:          2.116 m Patient Age:    69 years            BP:           109/55 mmHg Patient Gender: M                   HR:           114 bpm. Exam Location:  [facility]  Procedure: 2D Echo, Cardiac Doppler and Color Doppler Indications:     Elevated troponin  History:         Patient has prior history   of Echocardiogram examinations, most                  recent 08/04/2015. COPD; Risk Factors:Hypertension. Tobacco                  dependence.  Sonographer:     Sherrie Sport RDCS (AE) Referring Phys:  Geryl Rankins DOUTOVA Diagnosing Phys: Ida Rogue MD  Sonographer Comments: Technically difficult study due to poor echo windows, no apical window and no subcostal window. Image acquisition challenging due to COPD. IMPRESSIONS  1. Left ventricular ejection fraction, by estimation, is 60 to 65%. The left ventricle has normal function. The left ventricle has no regional wall motion abnormalities. Left ventricular diastolic parameters are indeterminate.  2. Right ventricular systolic function is normal. The right ventricular size is normal.  3. Challenging images FINDINGS  Left Ventricle: Left ventricular ejection fraction, by estimation, is 60 to 65%. The left ventricle has normal function. The left ventricle has no regional wall motion abnormalities. The left ventricular internal cavity size was normal in size. There is  no left ventricular hypertrophy. Left ventricular diastolic parameters are indeterminate. Right Ventricle: The right ventricular size is normal. No increase in right ventricular wall thickness. Right ventricular systolic function is normal. Left Atrium: Left atrial size was normal in size. Right Atrium: Right atrial size was normal in size. Pericardium: There is no evidence of pericardial effusion. Mitral Valve: The mitral valve is normal in structure. There is mild thickening of the mitral valve leaflet(s). No evidence of mitral valve regurgitation. No evidence of mitral valve stenosis. Tricuspid Valve: The tricuspid valve is normal in structure. Tricuspid valve regurgitation is not demonstrated. No evidence of tricuspid stenosis. Aortic Valve: The aortic valve was not  well visualized. Aortic valve regurgitation is not visualized. Mild to moderate aortic valve sclerosis/calcification is present, without any evidence of aortic stenosis. Pulmonic Valve: The pulmonic valve was normal in structure. Pulmonic valve regurgitation is not visualized. No evidence of pulmonic stenosis. Aorta: The aortic root is normal in size and structure. Venous: The inferior vena cava is normal in size with greater than 50% respiratory variability, suggesting right atrial pressure of 3 mmHg. IAS/Shunts: No atrial level shunt detected by color flow Doppler.  LEFT VENTRICLE PLAX 2D LVIDd:         4.38 cm LVIDs:         2.52 cm LV PW:         1.12 cm LV IVS:        1.24 cm LVOT diam:     2.00 cm LVOT Area:     3.14 cm  LEFT ATRIUM         Index LA diam:    4.00 cm 1.89 cm/m                        PULMONIC VALVE AORTA                 PV Vmax:        0.95 m/s Ao Root diam: 3.60 cm PV Peak grad:   3.6 mmHg                       RVOT Peak grad: 5 mmHg   SHUNTS Systemic Diam: 2.00 cm Ida Rogue MD Electronically signed by Ida Rogue MD Signature Date/Time: 06/11/2020/1:29:30 PM    Final           ASSESSMENT/PLAN  Acute on chronic hypoxemic respiratory failure - present on admission  - COVID19 negative   - supplemental O2 during my evaluation 6L/min  - will perform infectious workup for pneumonia -Respiratory viral panel -serum fungitell -legionella ab-negative -strep pneumoniae ur AG-negative -Histoplasma Ur Ag -sputum resp cultures-GPCs in clusters staph aureus - resitant -AFB sputum expectorated specimen -sputum cytology  -reviewed pertinent imaging with patient today - ESR/CRP -PT/OT for d/c planning  -please encourage patient to use incentive spirometer few times each hour while hospitalized.   -procalcitonin is mildly elevated - will trend -agree with zithromax and rocephin for CAP regimen at this time -wean FiO2 as able patient is generally on 2L/min [place] at home -s/p  TTE - diastology could not be assessed due to poor windows, will obtain BNP today as since there is interstitial edema on current CXR.           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   Patient/Family are satisfied with care plan and all questions have been answered.  This document was prepared using Dragon voice recognition software and may include unintentional dictation errors.     Ottie Glazier, M.D.  Division of [facility]

## 2020-06-14 NOTE — Progress Notes (Signed)
Physical Therapy Treatment Patient Details Name: Jeremiah [name] MRN: [id number] DOB: 1951/02/08 Today's Date: 06/14/2020    History of Present Illness presented to ER secondary to progressive SOB, cough x2-3 days; admitted for management of acute/chronic respiratory failure with hypoxia due to COPD exacerbation.  Of note, recently hospitalized (Jan, 2021) due to R foot cellulitis, covid +.    PT Comments    Patient reports he walked earlier, does not want to walk right now, agrees to bed exercises. He is slightly irritable throughout session, but polite. Patient performed B LE strengthening exercises with min cues. Patient will continue to benefit from skilled PT while here to improve strength, activity tolerance and safety with mobility.      Follow Up Recommendations  Home health PT     Equipment Recommendations  Rolling walker with 5" wheels;3in1 (PT)    Recommendations for Other Services       Precautions / Restrictions Precautions Precautions: Fall Precaution Comments: foot wounds Restrictions Weight Bearing Restrictions: No    Mobility  Bed Mobility               General bed mobility comments: patient declines oob activity, states he walked earlier  Transfers                 General transfer comment: declines  Ambulation/Gait             General Gait Details: declined   Stairs             Wheelchair Mobility    Modified Rankin (Stroke Patients Only)       Balance                                            Cognition Arousal/Alertness: Awake/alert Behavior During Therapy: WFL for tasks assessed/performed Overall Cognitive Status: Within Functional Limits for tasks assessed                                 General Comments: stated "if you stress me out I"ll be done"      Exercises Other Exercises Other Exercises: B LE strengthening exercises: Ap, heel slides, hip abd/add, SLR x 15 reps  each. Patient slightly fatigued with these with O2 sats down to 88%.    General Comments        Pertinent Vitals/Pain Pain Assessment: No/denies pain    Home Living                      Prior Function            PT Goals (current goals can now be found in the care plan section) Acute Rehab PT Goals Patient Stated Goal: to improve breathing PT Goal Formulation: With patient Time For Goal Achievement: 06/24/20 Potential to Achieve Goals: Good Progress towards PT goals: Progressing toward goals    Frequency    Min 2X/week      PT Plan Current plan remains appropriate    Co-evaluation              AM-PAC PT "6 Clicks" Mobility   Outcome Measure  Help needed turning from your back to your side while in a flat bed without using bedrails?: None Help needed moving from lying on your back to sitting on the side of  a flat bed without using bedrails?: None Help needed moving to and from a bed to a chair (including a wheelchair)?: A Little Help needed standing up from a chair using your arms (e.g., wheelchair or bedside chair)?: A Little Help needed to walk in hospital room?: A Little Help needed climbing 3-5 steps with a railing? : A Little 6 Click Score: 20    End of Session Equipment Utilized During Treatment: Oxygen Activity Tolerance: Patient limited by fatigue Patient left: in bed;with call bell/phone within reach Nurse Communication: Mobility status PT Visit Diagnosis: Muscle weakness (generalized) (M62.81);Difficulty in walking, not elsewhere classified (R26.2)     Time: 8978-4784 PT Time Calculation (min) (ACUTE ONLY): 12 min  Charges:  $Therapeutic Exercise: 8-22 mins                     [employer], PT, GCS 06/14/20,4:03 PM

## 2020-06-14 NOTE — Progress Notes (Signed)
OT Cancellation Note  Patient Details Name: Jeremiah [name] MRN: [id number] DOB: [birth year]   Cancelled Treatment:    Reason Eval/Treat Not Completed: Other (comment). Upon arrival PT at bed side working with pt. PT reports pt fatigued and only agreeable to bed level exercises. Will continue to follow POC at later date/time as able.   Dessie Coma, M.S. OTR/L  06/14/20, 4:11 PM  ascom [phone]

## 2020-06-14 NOTE — Progress Notes (Signed)
Mobility Specialist - Progress Note   06/14/20 1200  Mobility  Activity Ambulated in hall;Transferred to/from [facility]  Level of Assistance Minimal assist, patient does 75% or more  Assistive Device Front wheel walker  Distance Ambulated (ft) 30 ft  Mobility Response Tolerated well  Mobility performed by Mobility specialist  $Mobility charge 1 Mobility    Pre-mobility: 107 HR, 92% SpO2 During mobility: 125 HR, 85% SpO2 Post-mobility: 113 HR, 93% SpO2   Pt was lying in bed upon arrival utilizing 3L [place] O2. Pt agreed to session. Pt denied pain and fatigue, but did c/o mild nausea upon sitting. Pt familiar with incremental movement which was utilized throughout session. Pt was able to come into sitting EOB with supervision. O2 desat to 86% once seated. Mobility increased O2 to 4L, increasing sats to 93% after 1-2 minutes. Pt stood at bedside with supervision and began ambulation into hallway with minA, mostly for cord management. After ambulating just outside the door, pt began c/o SOB. O2 desat again to 81%. Mobility increased O2 to 6L and an extensive standing rest break was taken. Pt had BM upon return to room, provided pericare on self. Overall, pt tolerated well. Pt limited with activity d/t O2 saturations and fatigue. Pt left on 4L prior to exit. Nurse notified.    Kathee Delton Mobility Specialist 06/14/20, 1:00 PM

## 2020-06-15 DIAGNOSIS — J9621 Acute and chronic respiratory failure with hypoxia: Secondary | ICD-10-CM | POA: Diagnosis not present

## 2020-06-15 DIAGNOSIS — J449 Chronic obstructive pulmonary disease, unspecified: Secondary | ICD-10-CM | POA: Diagnosis not present

## 2020-06-15 DIAGNOSIS — R6520 Severe sepsis without septic shock: Secondary | ICD-10-CM | POA: Diagnosis not present

## 2020-06-15 DIAGNOSIS — A419 Sepsis, unspecified organism: Secondary | ICD-10-CM | POA: Diagnosis not present

## 2020-06-15 LAB — BASIC METABOLIC PANEL
Anion gap: 8 (ref 5–15)
BUN: 23 mg/dL (ref 8–23)
CO2: 28 mmol/L (ref 22–32)
Calcium: 8.7 mg/dL — ABNORMAL LOW (ref 8.9–10.3)
Chloride: 90 mmol/L — ABNORMAL LOW (ref 98–111)
Creatinine, Ser: 0.53 mg/dL — ABNORMAL LOW (ref 0.61–1.24)
GFR, Estimated: >60 mL/min (ref >60)
Glucose, Bld: 82 mg/dL (ref 70–99)
Potassium: 4.2 mmol/L (ref 3.5–5.1)
Sodium: 126 mmol/L — ABNORMAL LOW (ref 135–145)

## 2020-06-15 MED ORDER — SODIUM CHLORIDE 1 G PO TABS
1.0000 g | ORAL_TABLET | Freq: Two times a day (BID) | ORAL | Status: DC
  Administered 2020-06-15 – 2020-06-17 (×4): 1 g via ORAL
  Filled 2020-06-15 (×5): qty 1

## 2020-06-15 MED ORDER — IPRATROPIUM-ALBUTEROL 0.5-2.5 (3) MG/3ML IN SOLN
3.0000 mL | RESPIRATORY_TRACT | Status: DC
  Administered 2020-06-16 (×2): 3 mL via RESPIRATORY_TRACT
  Filled 2020-06-15 (×2): qty 3

## 2020-06-15 MED ORDER — METHYLPREDNISOLONE SODIUM SUCC 40 MG IJ SOLR
40.0000 mg | Freq: Two times a day (BID) | INTRAMUSCULAR | Status: DC
  Administered 2020-06-15 – 2020-06-16 (×4): 40 mg via INTRAVENOUS
  Filled 2020-06-15 (×4): qty 1

## 2020-06-15 NOTE — Progress Notes (Signed)
Occupational Therapy Treatment Patient Details Name: Jeremiah [name] MRN: [id number] DOB: 27-Nov-1950 Today's Date: 06/15/2020    History of present illness presented to ER secondary to progressive SOB, cough x2-3 days; admitted for management of acute/chronic respiratory failure with hypoxia due to COPD exacerbation.  Of note, recently hospitalized (Jan, 2021) due to R foot cellulitis, covid +.   OT comments  Jeremiah [name] was seen for OT treatment on this date. Upon arrival to room pt seated on BSC, agreeable to bathing session. Pt is easily flustered and extremely self limiting. Pt requires MAX cueing to perform ADLs without physical assist. Pt repeatedly states "if you rush me I won't work with you" and tells OT "you must get easily confused." SpO2 86-90% on 4L [place] seated on BSC (decreases with conversation and coughing, resolves to 88-90% with prolonged rest. Desat standing to 80% on 4L [place], resolved with >34min seated rest.  Pt requires SETUP for seated UB dressing/bathing seated on BSC. MIN A doff pants seated on BSC - asssit for heel mgmt. MOD I toileting and perihygiene with lateral leans on BSC. SUPERVISION washing perineal region in standing. Pt making progress toward goals. Pt continues to benefit from skilled OT services to maximize return to PLOF and minimize risk of future falls, injury, caregiver burden, and readmission. Will continue to follow POC. Discharge recommendation remains appropriate.    Follow Up Recommendations  Home health OT;Supervision - Intermittent    Equipment Recommendations  3 in 1 bedside commode;Tub/shower seat;Other (comment)    Recommendations for Other Services      Precautions / Restrictions Precautions Precautions: Fall Precaution Comments: foot wounds Restrictions Weight Bearing Restrictions: No       Mobility Bed Mobility               General bed mobility comments: Pt received and left up on BSC  Transfers Overall transfer level: Needs  assistance Equipment used: None Transfers: Sit to/from Stand Sit to Stand: Supervision              Balance Overall balance assessment: Needs assistance Sitting-balance support: Feet supported;No upper extremity supported Sitting balance-Leahy Scale: Good     Standing balance support: No upper extremity supported;During functional activity Standing balance-Leahy Scale: Fair                             ADL either performed or assessed with clinical judgement   ADL Overall ADL's : Needs assistance/impaired                                       General ADL Comments: SETUP for seated UB dressing/bathing seated on BSC. MIN A doff pants seated on BSC - asssit for heel mgmt. MOD I toileting and perihygiene with lateral leans on BSC. SUPERVISION washing perineal region in standing               Cognition Arousal/Alertness: Awake/alert Behavior During Therapy: WFL for tasks assessed/performed Overall Cognitive Status: Within Functional Limits for tasks assessed                                 General Comments: Pt is easily flustered and extremely self limiting. Pt requires MAX cueing to perform ADLs without physical assist. Pt repeatedly states "if you rush me I won't  work with you" and tell OT "you must get easily confused."        Exercises Exercises: Other exercises Other Exercises Other Exercises: Pt educated re: OT role, falls prevention, ECS, importance of mobility for funcitonal strengthening Other Exercises: UB/LB bathing/dressing, toileting, sit<>standx3, sitting/standing balance/tolerance      General Comments SpO2 86-90% on 4L [place] seated on BSC (decreases with conversation and coughing, resolves to 88-90% with prolonged rest. Desat standing to 80% on 4L [place], resolved with >54min seated rest.    Pertinent Vitals/ Pain       Pain Assessment: No/denies pain         Frequency  Min 1X/week        Progress Toward  Goals  OT Goals(current goals can now be found in the care plan section)  Progress towards OT goals: Progressing toward goals  Acute Rehab OT Goals Patient Stated Goal: to improve breathing OT Goal Formulation: With patient Time For Goal Achievement: 06/24/20 Potential to Achieve Goals: Good ADL Goals Pt Will Perform Lower Body Bathing: with set-up;with supervision;sit to/from stand Pt Will Perform Lower Body Dressing: with supervision;sit to/from stand Pt Will Transfer to Toilet: with supervision;with min guard assist;ambulating;bedside commode;grab bars Pt Will Perform Toileting - Clothing Manipulation and hygiene: with supervision;sit to/from stand Pt/caregiver will Perform Home Exercise Program: Increased strength;Right Upper extremity;Increased ROM;Left upper extremity;With minimal assist Additional ADL Goal #1: Pt will independently verbalize x3 fall prevention strategies to optimize safety and independence in ADLs.  Plan Discharge plan remains appropriate;Frequency remains appropriate       AM-PAC OT "6 Clicks" Daily Activity     Outcome Measure   Help from another person eating meals?: None Help from another person taking care of personal grooming?: A Little Help from another person toileting, which includes using toliet, bedpan, or urinal?: A Little Help from another person bathing (including washing, rinsing, drying)?: A Little Help from another person to put on and taking off regular upper body clothing?: A Little Help from another person to put on and taking off regular lower body clothing?: A Little 6 Click Score: 19    End of Session Equipment Utilized During Treatment: Oxygen  OT Visit Diagnosis: Other abnormalities of gait and mobility (R26.89);Muscle weakness (generalized) (M62.81);History of falling (Z91.81)   Activity Tolerance Patient tolerated treatment well   Patient Left with call bell/phone within reach;with nursing/sitter in room;Other (comment) (on  [facility])   Nurse Communication Mobility status        Time: 3790-2409 OT Time Calculation (min): 60 min  Charges: OT General Charges $OT Visit: 1 Visit OT Treatments $Self Care/Home Management : 53-67 mins  Dessie Coma, M.S. OTR/L  06/15/20, 1:53 PM  ascom [phone]

## 2020-06-15 NOTE — Progress Notes (Signed)
PROGRESS NOTE    Jeremiah [name]  UYQ:[id number] DOB: [birth year] DOA: 06/09/2020 PCP: System, Provider Not In   Chief complaint.  Shortness of breath. Brief Narrative:  This patient is 70 years old male with PMH significant for COPD on home oxygen at 2 L, hypertension, tobacco abuse, hyponatremia, GERD, history of recent Covid infection presents in the emergency department with productive cough and shortness of breath. He is found to be hypoxic, requiring 6 L of oxygen to maintain saturation above 91%, found to have leukocytosis. He was hospitalized last month for Covid pneumonia and was treated with remdesivir, steroids bronchodilators and has completed treatment while hospitalized. Patient is admitted for sepsis secondary to multifocal pneumonia and acute on chronic hypoxic respiratory failure requiring 6 L of oxygen.   Assessment & Plan:   Active Problems:   Acute on chronic respiratory failure with hypoxia (HCC)   Tobacco dependency   COPD with chronic bronchitis (HCC)   Hyponatremia   Sepsis ([place])   HTN (hypertension)   #1 Severe sepsis secondary to pneumonia. Acute on chronic hypoxemic respiratory failure secondary to pneumonia Multifocal pneumonia. Patient had another episode of short of breath last night, currently on 4 L oxygen.  He received extra dose of Lasix orally yesterday, does not feel he has any additional volume overload. I will restart IV steroids as patient condition is not improving. Procalcitonin came down, antibiotic effective. Continue Dulera and Spiriva.  Continue as needed albuterol.  2.  Hyponatremia. Continue fluid restriction, continue salt tablets.  Three.  Elevated troponin secondary to demand ischemia due to sepsis.   DVT prophylaxis: Lovenox Code Status: Full Family Communication:  Disposition Plan:  .   Status is: Inpatient  Remains inpatient appropriate because:Inpatient level of care appropriate due to severity of  illness   Dispo: The patient is from: Home              Anticipated d/c is to: Home              Anticipated d/c date is: 2 days              Patient currently is not medically stable to d/c.   Difficult to place patient No        I/O last 3 completed shifts: In: 59 [P.O.:1320] Out: 44 [Urine:4510] Total I/O In: 480 [P.O.:480] Out: -      Consultants:   Pulm  Procedures: None  Antimicrobials: Doxycycline  Subjective: Patient had episode short of breath last night, currently on 4 L oxygen.  No wheezing today. No fever chills per No abdominal pain or nausea vomiting No chest pain or palpitation  Objective: Vitals:   06/14/20 2159 06/15/20 0348 06/15/20 0727 06/15/20 1015  BP: 112/67 126/77 130/75   Pulse: 98 87 89   Resp: 19 18 17    Temp: 98.5 F (36.9 C) 97.8 F (36.6 C) 97.9 F (36.6 C)   TempSrc: Oral Oral Oral   SpO2: 94% 94% 94% 94%  Weight:  87.5 kg    Height:        Intake/Output Summary (Last 24 hours) at 06/15/2020 1056 Last data filed at 06/15/2020 0940 Gross per 24 hour  Intake 960 ml  Output 2560 ml  Net -1600 ml   Filed Weights   06/13/20 0534 06/14/20 0407 06/15/20 0348  Weight: 87.5 kg 87.3 kg 87.5 kg    Examination:  General exam: Appears calm and comfortable  Respiratory system: Decreased breathing sounds. Respiratory effort normal. Cardiovascular system:  S1 & S2 heard, RRR. No JVD, murmurs, rubs, gallops or clicks. No pedal edema. Gastrointestinal system: Abdomen is nondistended, soft and nontender. No organomegaly or masses felt. Normal bowel sounds heard. Central nervous system: Alert and oriented. No focal neurological deficits. Extremities: Symmetric  Skin: No rashes, lesions or ulcers Psychiatry: Judgement and insight appear normal. Mood & affect appropriate.     Data Reviewed: I have personally reviewed following labs and imaging studies  CBC: Recent Labs  Lab 06/09/20 2110 06/10/20 0518 06/11/20 0541  06/12/20 0411 06/14/20 0506  WBC 31.1* 29.2* 20.8* 21.3* 20.5*  NEUTROABS  --  27.3*  --   --  15.5*  HGB 10.4* 10.5* 10.6* 10.0* 10.3*  HCT 30.5* 31.1* 30.4* 29.4* 29.8*  MCV 86.6 87.4 87.1 87.0 85.6  PLT 369 372 345 372 673*   Basic Metabolic Panel: Recent Labs  Lab 06/10/20 0518 06/11/20 0541 06/12/20 0411 06/13/20 0529 06/14/20 0506 06/15/20 0502  NA 125* 123* 127* 125* 129* 126*  K 4.1 3.8 3.4* 4.1 4.2 4.2  CL 93* 93* 96* 90* 93* 90*  CO2 24 23 23 23 27 28   GLUCOSE 142* 96 93 135* 146* 82  BUN 17 19 16 21 23 23   CREATININE 0.62 0.55* 0.50* 0.51* 0.63 0.53*  CALCIUM 8.6* 8.6* 8.2* 8.6* 8.5* 8.7*  MG 1.6* 1.5* 1.6* 2.0 1.9  --   PHOS 2.6 2.1* 2.7 3.3 3.5  --    GFR: Estimated Creatinine Clearance: 98.5 mL/min (A) (by C-G formula based on SCr of 0.53 mg/dL (L)). Liver Function Tests: Recent Labs  Lab 06/09/20 2110 06/10/20 0518 06/11/20 0541  AST 24 19 33  ALT 21 22 36  ALKPHOS 122 120 134*  BILITOT 1.1 0.7 0.7  PROT 6.7 7.1 6.3*  ALBUMIN 3.2* 3.2* 2.8*   No results for input(s): LIPASE, AMYLASE in the last 168 hours. No results for input(s): AMMONIA in the last 168 hours. Coagulation Profile: Recent Labs  Lab 06/09/20 2326  INR 1.4*   Cardiac Enzymes: Recent Labs  Lab 06/10/20 0518  CKTOTAL 74   BNP (last 3 results) No results for input(s): PROBNP in the last 8760 hours. HbA1C: No results for input(s): HGBA1C in the last 72 hours. CBG: No results for input(s): GLUCAP in the last 168 hours. Lipid Profile: No results for input(s): CHOL, HDL, LDLCALC, TRIG, CHOLHDL, LDLDIRECT in the last 72 hours. Thyroid Function Tests: No results for input(s): TSH, T4TOTAL, FREET4, T3FREE, THYROIDAB in the last 72 hours. Anemia Panel: No results for input(s): VITAMINB12, FOLATE, FERRITIN, TIBC, IRON, RETICCTPCT in the last 72 hours. Sepsis Labs: Recent Labs  Lab 06/09/20 2156 06/09/20 2326 06/10/20 0518 06/11/20 0541 06/14/20 0506  PROCALCITON  --  0.84  1.02 1.05 0.23  LATICACIDVEN 1.4  --   --   --   --     Recent Results (from the past 240 hour(s))  SARS Coronavirus 2 by RT PCR (hospital order, performed in [facility] hospital lab) Nasopharyngeal Nasopharyngeal Swab     Status: None   Collection Time: 06/09/20  9:02 PM   Specimen: Nasopharyngeal Swab  Result Value Ref Range Status   SARS Coronavirus 2 NEGATIVE NEGATIVE Final    Comment: (NOTE) SARS-CoV-2 target nucleic acids are NOT DETECTED.  The SARS-CoV-2 RNA is generally detectable in upper and lower respiratory specimens during the acute phase of infection. The lowest concentration of SARS-CoV-2 viral copies this assay can detect is 250 copies / mL. A negative result does not preclude SARS-CoV-2  infection and should not be used as the sole basis for treatment or other patient management decisions.  A negative result may occur with improper specimen collection / handling, submission of specimen other than nasopharyngeal swab, presence of viral mutation(s) within the areas targeted by this assay, and inadequate number of viral copies (<250 copies / mL). A negative result must be combined with clinical observations, patient history, and epidemiological information.  Fact Sheet for Patients:   StrictlyIdeas.no  Fact Sheet for Healthcare Providers: BankingDealers.co.za  This test is not yet approved or  cleared by the [place] FDA and has been authorized for detection and/or diagnosis of SARS-CoV-2 by FDA under an Emergency Use Authorization (EUA).  This EUA will remain in effect (meaning this test can be used) for the duration of the COVID-19 declaration under Section 564(b)(1) of the Act, 21 U.S.C. section 360bbb-3(b)(1), unless the authorization is terminated or revoked sooner.  Performed at [facility], [address] [place], [place] [address]   Blood culture (routine x 2)     Status: None   Collection  Time: 06/09/20  9:50 PM   Specimen: BLOOD  Result Value Ref Range Status   Specimen Description BLOOD LEFT ANTECUBITAL  Final   Special Requests   Final    BOTTLES DRAWN AEROBIC AND ANAEROBIC Blood Culture results may not be optimal due to an inadequate volume of blood received in culture bottles   Culture   Final    NO GROWTH 5 DAYS Performed at [facility], [address] [place], [place] [address]    Report Status 06/14/2020 FINAL  Final  Blood culture (routine x 2)     Status: None   Collection Time: 06/09/20  9:56 PM   Specimen: BLOOD  Result Value Ref Range Status   Specimen Description BLOOD RIGHT ANTECUBITAL  Final   Special Requests   Final    BOTTLES DRAWN AEROBIC AND ANAEROBIC Blood Culture results may not be optimal due to an inadequate volume of blood received in culture bottles   Culture   Final    NO GROWTH 5 DAYS Performed at [facility], [address], [place], [place] [address]    Report Status 06/14/2020 FINAL  Final  Culture, sputum-assessment     Status: None   Collection Time: 06/10/20  1:00 PM   Specimen: Expectorated Sputum  Result Value Ref Range Status   Specimen Description EXPECTORATED SPUTUM  Final   Special Requests NONE  Final   Sputum evaluation   Final    THIS SPECIMEN IS ACCEPTABLE FOR SPUTUM CULTURE Performed at [facility], [address] [place], [place] [address]    Report Status 06/10/2020 FINAL  Final  Culture, respiratory     Status: None   Collection Time: 06/10/20  1:00 PM  Result Value Ref Range Status   Specimen Description   Final    EXPECTORATED SPUTUM Performed at [facility], [address] [place], [place] [address]    Special Requests   Final    NONE Reflexed from [id number] Performed at [facility], [address] [place], [place] [address]    Gram Stain   Final    FEW WBC PRESENT,BOTH PMN AND MONONUCLEAR MODERATE GRAM POSITIVE COCCI IN CLUSTERS RARE GRAM NEGATIVE  RODS Performed at [facility], [place] [address] [place], [place] [address]    Culture MODERATE STAPHYLOCOCCUS AUREUS  Final   Report Status 06/13/2020 FINAL  Final   Organism  ID, Bacteria STAPHYLOCOCCUS AUREUS  Final      Susceptibility   Staphylococcus aureus - MIC*    CIPROFLOXACIN >=8 RESISTANT Resistant     ERYTHROMYCIN <=0.25 SENSITIVE Sensitive     GENTAMICIN <=0.5 SENSITIVE Sensitive     OXACILLIN <=0.25 SENSITIVE Sensitive     TETRACYCLINE <=1 SENSITIVE Sensitive     VANCOMYCIN 1 SENSITIVE Sensitive     TRIMETH/SULFA <=10 SENSITIVE Sensitive     CLINDAMYCIN <=0.25 SENSITIVE Sensitive     RIFAMPIN <=0.5 SENSITIVE Sensitive     Inducible Clindamycin NEGATIVE Sensitive     * MODERATE STAPHYLOCOCCUS AUREUS  MRSA PCR Screening     Status: None   Collection Time: 06/11/20  9:57 AM   Specimen: Nasal Mucosa; Nasopharyngeal  Result Value Ref Range Status   MRSA by PCR NEGATIVE NEGATIVE Final    Comment:        The GeneXpert MRSA Assay (FDA approved for NASAL specimens only), is one component of a comprehensive MRSA colonization surveillance program. It is not intended to diagnose MRSA infection nor to guide or monitor treatment for MRSA infections. Performed at [facility], [place], [place] [address]   Respiratory (~20 pathogens) panel by PCR     Status: None   Collection Time: 06/11/20  7:14 PM   Specimen: Nasopharyngeal Swab; Respiratory  Result Value Ref Range Status   Adenovirus NOT DETECTED NOT DETECTED Final   Coronavirus 229E NOT DETECTED NOT DETECTED Final    Comment: (NOTE) The Coronavirus on the Respiratory Panel, DOES NOT test for the novel  Coronavirus (2019 nCoV)    Coronavirus HKU1 NOT DETECTED NOT DETECTED Final   Coronavirus NL63 NOT DETECTED NOT DETECTED Final   Coronavirus OC43 NOT DETECTED NOT DETECTED Final   Metapneumovirus NOT DETECTED NOT DETECTED Final   Rhinovirus / Enterovirus NOT DETECTED NOT DETECTED Final    Influenza A NOT DETECTED NOT DETECTED Final   Influenza B NOT DETECTED NOT DETECTED Final   Parainfluenza Virus 1 NOT DETECTED NOT DETECTED Final   Parainfluenza Virus 2 NOT DETECTED NOT DETECTED Final   Parainfluenza Virus 3 NOT DETECTED NOT DETECTED Final   Parainfluenza Virus 4 NOT DETECTED NOT DETECTED Final   Respiratory Syncytial Virus NOT DETECTED NOT DETECTED Final   Bordetella pertussis NOT DETECTED NOT DETECTED Final   Bordetella Parapertussis NOT DETECTED NOT DETECTED Final   Chlamydophila pneumoniae NOT DETECTED NOT DETECTED Final   Mycoplasma pneumoniae NOT DETECTED NOT DETECTED Final    Comment: Performed at [facility], [place]. [address], [place], [place] [address]         Radiology Studies: No results found.      Scheduled Meds: . atorvastatin  40 mg Oral Daily  . doxycycline  100 mg Oral Q12H  . enoxaparin (LOVENOX) injection  40 mg Subcutaneous Q24H  . furosemide  20 mg Intravenous Daily  . guaiFENesin  600 mg Oral BID  . methylPREDNISolone (SOLU-MEDROL) injection  40 mg Intravenous Q12H  . mometasone-formoterol  2 puff Inhalation BID  . montelukast  10 mg Oral QHS  . nicotine  21 mg Transdermal Daily  . pantoprazole  20 mg Oral Daily  . sodium chloride flush  10 mL Intravenous Q12H  . tiotropium  18 mcg Inhalation Daily   Continuous Infusions:   LOS: 6 days    Time spent: 28 minutes    Sharen Hones, MD [facility]   To contact the attending provider between 7A-7P or the covering  provider during after hours 7P-7A, please log into the web site [email or web address] and access using universal [place] password for that web site. If you do not have the password, please call the hospital operator.  06/15/2020, 10:56 AM

## 2020-06-15 NOTE — Progress Notes (Signed)
Patient requests tylenol for pain of 8/10 for his headache.

## 2020-06-15 NOTE — Progress Notes (Signed)
Physical Therapy Treatment Patient Details Name: Jeremiah [name] MRN: [id number] DOB: Sep 18, 1950 Today's Date: 06/15/2020    History of Present Illness presented to ER secondary to progressive SOB, cough x2-3 days; admitted for management of acute/chronic respiratory failure with hypoxia due to COPD exacerbation.  Of note, recently hospitalized (Jan, 2021) due to R foot cellulitis, covid +.    PT Comments    Patient received in bed, he is agreeable to PT session. Patient reports he has finally gotten his breathing under control. Increased time needed for all activities due to sob. Patient is mod independent with bed mobility and transfers. Patient ambulated 40 feet with RW, then another 20 feet after seated rest break. O2 saturations down to 83% with activity. He will continue to benefit from skilled PT while here to improve functional mobility and activity tolerance.       Follow Up Recommendations  Home health PT;Other (comment) (patient does not want)     Equipment Recommendations  Rolling walker with 5" wheels;3in1 (PT)    Recommendations for Other Services       Precautions / Restrictions Precautions Precautions: Fall Precaution Comments: foot wounds Restrictions Weight Bearing Restrictions: No Other Position/Activity Restrictions: b/l foot wounds and terribly long toe nails    Mobility  Bed Mobility Overal bed mobility: Modified Independent Bed Mobility: Supine to Sit;Sit to Supine     Supine to sit: Modified independent (Device/Increase time);HOB elevated Sit to supine: Modified independent (Device/Increase time);HOB elevated   General bed mobility comments: Pt received and left up on BSC  Transfers Overall transfer level: Modified independent Equipment used: None Transfers: Sit to/from Stand Sit to Stand: Modified independent (Device/Increase time)            Ambulation/Gait Ambulation/Gait assistance: Min guard Gait Distance (Feet): 60  Feet Assistive device: Rolling walker (2 wheeled) Gait Pattern/deviations: Step-through pattern;Decreased stride length Gait velocity: decr   General Gait Details: requires seated rest after 40 feet with O2 saturations down to 83% on 4 lpm.   Stairs             Wheelchair Mobility    Modified Rankin (Stroke Patients Only)       Balance Overall balance assessment: Modified Independent Sitting-balance support: Feet supported Sitting balance-Leahy Scale: Good     Standing balance support: Bilateral upper extremity supported;During functional activity Standing balance-Leahy Scale: Good Standing balance comment: benefits from UE support.                            Cognition Arousal/Alertness: Awake/alert Behavior During Therapy: WFL for tasks assessed/performed Overall Cognitive Status: Within Functional Limits for tasks assessed                                 General Comments: patient very pleasant this pm, apologizing for this morning. Says when he can't breath he gets frustrated      Exercises Other Exercises Other Exercises: Pt educated re: OT role, falls prevention, ECS, importance of mobility for funcitonal strengthening Other Exercises: UB/LB bathing/dressing, toileting, sit<>standx3, sitting/standing balance/tolerance    General Comments General comments (skin integrity, edema, etc.): SpO2 86-90% on 4L [place] seated on BSC (decreases with conversation and coughing, resolves to 88-90% with prolonged rest. Desat standing to 80% on 4L , resolved with >36min seated rest.      Pertinent Vitals/Pain Pain Assessment: No/denies pain    Home Living  Prior Function            PT Goals (current goals can now be found in the care plan section) Acute Rehab PT Goals Patient Stated Goal: to improve breathing PT Goal Formulation: With patient Time For Goal Achievement: 06/24/20 Potential to Achieve Goals:  Good Progress towards PT goals: Progressing toward goals    Frequency    Min 2X/week      PT Plan Current plan remains appropriate    Co-evaluation              AM-PAC PT "6 Clicks" Mobility   Outcome Measure  Help needed turning from your back to your side while in a flat bed without using bedrails?: None Help needed moving from lying on your back to sitting on the side of a flat bed without using bedrails?: None Help needed moving to and from a bed to a chair (including a wheelchair)?: None Help needed standing up from a chair using your arms (e.g., wheelchair or bedside chair)?: None Help needed to walk in hospital room?: A Little Help needed climbing 3-5 steps with a railing? : A Little 6 Click Score: 22    End of Session Equipment Utilized During Treatment: Oxygen Activity Tolerance: Patient limited by fatigue Patient left: in bed;with call bell/phone within reach Nurse Communication: Mobility status PT Visit Diagnosis: Muscle weakness (generalized) (M62.81);Difficulty in walking, not elsewhere classified (R26.2)     Time: 0375-4360 PT Time Calculation (min) (ACUTE ONLY): 27 min  Charges:  $Gait Training: 23-37 mins                     Lezly Rumpf, PT, GCS 06/15/20,3:19 PM

## 2020-06-15 NOTE — Progress Notes (Signed)
PT Cancellation Note  Patient Details Name: Jeremiah [name] MRN: [id number] DOB: 1951/01/09   Cancelled Treatment:    Reason Eval/Treat Not Completed: Other (comment). Patient states he is having trouble with his breathing and would like his morning meds prior to PT. RN notified. Will re-attempt later this morning as able.    Yosef Krogh 06/15/2020, 9:39 AM

## 2020-06-16 DIAGNOSIS — A419 Sepsis, unspecified organism: Secondary | ICD-10-CM | POA: Diagnosis not present

## 2020-06-16 DIAGNOSIS — J189 Pneumonia, unspecified organism: Secondary | ICD-10-CM | POA: Diagnosis not present

## 2020-06-16 DIAGNOSIS — J9621 Acute and chronic respiratory failure with hypoxia: Secondary | ICD-10-CM | POA: Diagnosis not present

## 2020-06-16 DIAGNOSIS — J449 Chronic obstructive pulmonary disease, unspecified: Secondary | ICD-10-CM | POA: Diagnosis not present

## 2020-06-16 HISTORY — DX: Pneumonia, unspecified organism: J18.9

## 2020-06-16 LAB — CBC WITH DIFFERENTIAL/PLATELET
Abs Immature Granulocytes: 2.03 10*3/uL — ABNORMAL HIGH (ref 0.00–0.07)
Basophils Absolute: 0.2 10*3/uL — ABNORMAL HIGH (ref 0.0–0.1)
Basophils Relative: 1 %
Eosinophils Absolute: 0.1 10*3/uL (ref 0.0–0.5)
Eosinophils Relative: 0 %
HCT: 31.6 % — ABNORMAL LOW (ref 39.0–52.0)
Hemoglobin: 10.8 g/dL — ABNORMAL LOW (ref 13.0–17.0)
Immature Granulocytes: 8 %
Lymphocytes Relative: 7 %
Lymphs Abs: 1.9 10*3/uL (ref 0.7–4.0)
MCH: 29.1 pg (ref 26.0–34.0)
MCHC: 34.2 g/dL (ref 30.0–36.0)
MCV: 85.2 fL (ref 80.0–100.0)
Monocytes Absolute: 1.1 10*3/uL — ABNORMAL HIGH (ref 0.1–1.0)
Monocytes Relative: 4 %
Neutro Abs: 21.1 10*3/uL — ABNORMAL HIGH (ref 1.7–7.7)
Neutrophils Relative %: 80 %
Platelets: 452 10*3/uL — ABNORMAL HIGH (ref 150–400)
RBC: 3.71 MIL/uL — ABNORMAL LOW (ref 4.22–5.81)
RDW: 14.8 % (ref 11.5–15.5)
Smear Review: NORMAL
WBC: 26.3 10*3/uL — ABNORMAL HIGH (ref 4.0–10.5)
nRBC: 0 % (ref 0.0–0.2)

## 2020-06-16 LAB — BASIC METABOLIC PANEL
Anion gap: 9 (ref 5–15)
BUN: 24 mg/dL — ABNORMAL HIGH (ref 8–23)
CO2: 27 mmol/L (ref 22–32)
Calcium: 8.7 mg/dL — ABNORMAL LOW (ref 8.9–10.3)
Chloride: 92 mmol/L — ABNORMAL LOW (ref 98–111)
Creatinine, Ser: 0.48 mg/dL — ABNORMAL LOW (ref 0.61–1.24)
GFR, Estimated: >60 mL/min (ref >60)
Glucose, Bld: 108 mg/dL — ABNORMAL HIGH (ref 70–99)
Potassium: 4.6 mmol/L (ref 3.5–5.1)
Sodium: 128 mmol/L — ABNORMAL LOW (ref 135–145)

## 2020-06-16 LAB — MAGNESIUM: Magnesium: 1.8 mg/dL (ref 1.7–2.4)

## 2020-06-16 LAB — FUNGITELL, SERUM: Fungitell Result: <31 pg/mL (ref <80)

## 2020-06-16 MED ORDER — IPRATROPIUM-ALBUTEROL 0.5-2.5 (3) MG/3ML IN SOLN
3.0000 mL | Freq: Four times a day (QID) | RESPIRATORY_TRACT | Status: DC
  Administered 2020-06-16 – 2020-06-17 (×4): 3 mL via RESPIRATORY_TRACT
  Filled 2020-06-16 (×4): qty 3

## 2020-06-16 MED ORDER — FUROSEMIDE 10 MG/ML IJ SOLN
20.0000 mg | Freq: Two times a day (BID) | INTRAMUSCULAR | Status: DC
  Administered 2020-06-16: 20 mg via INTRAVENOUS
  Filled 2020-06-16 (×2): qty 2

## 2020-06-16 NOTE — Progress Notes (Signed)
PROGRESS NOTE    Jeremiah [name]  JSE:[id number] DOB: May 31, 1950 DOA: 06/09/2020 PCP: System, Provider Not In   Chief complaint.  Shortness of breath Brief Narrative:  This patient is 70 years old male with PMH significant for COPD on home oxygen at 2 L, hypertension, tobacco abuse, hyponatremia, GERD, history of recent Covid infection presents in the emergency department with productive cough and shortness of breath. He is found to be hypoxic, requiring 6 L of oxygen to maintain saturation above 91%, found to have leukocytosis. He was hospitalized last month for Covid pneumonia and was treated with remdesivir, steroids bronchodilators and has completed treatment while hospitalized. Patient is admitted for sepsis secondary to multifocal pneumonia and acute on chronic hypoxic respiratory failure requiring 6 L of oxygen.    Assessment & Plan:   Active Problems:   Acute on chronic respiratory failure with hypoxia (HCC)   Tobacco dependency   COPD with chronic bronchitis (HCC)   Hyponatremia   Sepsis ([place])   HTN (hypertension)  #1 Severe sepsis secondary to pneumonia. Acute on chronic hypoxemic respiratory failure secondary to pneumonia Multifocal pneumonia. Patient pneumonia had improved, completed Zithromax and Rocephin, currently on doxycycline for COPD exacerbation. Patient is slow in improving with his oxygenation, still requiring 3 to 4 L oxygen.  But seem to be better after starting IV Solu-Medrol.  We will continue IV steroids for another day, anticipating discharge to home tomorrow with steroid taper. All the home care and equipment to prepare for discharge on Friday.  #2.  Hyponatremia secondary to SIADH. Continue fluid restriction. Restarted salt tablet yesterday.  Continue to follow.  3.  Elevated troponin secondary to demand ischemia due to sepsis.    DVT prophylaxis: Lovenox Code Status: Full Family Communication:  Disposition Plan:  .   Status is:  Inpatient  Remains inpatient appropriate because:Inpatient level of care appropriate due to severity of illness   Dispo: The patient is from: Home              Anticipated d/c is to: Home              Anticipated d/c date is: 1 day              Patient currently is not medically stable to d/c.   Difficult to place patient No        I/O last 3 completed shifts: In: 1330 [P.O.:1320; I.V.:10] Out: 2850 [Urine:2850] Total I/O In: 240 [P.O.:240] Out: 225 [Urine:225]     Consultants:   Pulm  Procedures: None  Antimicrobials:  Doxy  Subjective: Patient feels better this morning, still on 4 L oxygen.  No wheezing. Cough, nonproductive. No fever chills per No abdominal pain or nausea vomiting. No dysuria hematuria.  Objective: Vitals:   06/16/20 0345 06/16/20 0348 06/16/20 0402 06/16/20 0834  BP:   113/62 120/71  Pulse:   88 96  Resp:   16 20  Temp:   98.7 F (37.1 C) 97.9 F (36.6 C)  TempSrc:   Oral Oral  SpO2:  97% 95% 96%  Weight: 86.9 kg     Height:        Intake/Output Summary (Last 24 hours) at 06/16/2020 0941 Last data filed at 06/16/2020 0840 Gross per 24 hour  Intake 1090 ml  Output 1675 ml  Net -585 ml   Filed Weights   06/14/20 0407 06/15/20 0348 06/16/20 0345  Weight: 87.3 kg 87.5 kg 86.9 kg    Examination:  General exam: Appears  calm and comfortable  Respiratory system: He is breathing sounds without crackles or wheezes. Respiratory effort normal. Cardiovascular system: S1 & S2 heard, RRR. No JVD, murmurs, rubs, gallops or clicks. No pedal edema. Gastrointestinal system: Abdomen is nondistended, soft and nontender. No organomegaly or masses felt. Normal bowel sounds heard. Central nervous system: Alert and oriented. No focal neurological deficits. Extremities: Symmetric 5 x 5 power. Skin: No rashes, lesions or ulcers Psychiatry: Mood & affect appropriate.     Data Reviewed: I have personally reviewed following labs and imaging  studies  CBC: Recent Labs  Lab 06/10/20 0518 06/11/20 0541 06/12/20 0411 06/14/20 0506 06/16/20 0557  WBC 29.2* 20.8* 21.3* 20.5* 26.3*  NEUTROABS 27.3*  --   --  15.5* 21.1*  HGB 10.5* 10.6* 10.0* 10.3* 10.8*  HCT 31.1* 30.4* 29.4* 29.8* 31.6*  MCV 87.4 87.1 87.0 85.6 85.2  PLT 372 345 372 418* 761*   Basic Metabolic Panel: Recent Labs  Lab 06/10/20 0518 06/11/20 0541 06/12/20 0411 06/13/20 0529 06/14/20 0506 06/15/20 0502 06/16/20 0557  NA 125* 123* 127* 125* 129* 126* 128*  K 4.1 3.8 3.4* 4.1 4.2 4.2 4.6  CL 93* 93* 96* 90* 93* 90* 92*  CO2 24 23 23 23 27 28 27   GLUCOSE 142* 96 93 135* 146* 82 108*  BUN 17 19 16 21 23 23  24*  CREATININE 0.62 0.55* 0.50* 0.51* 0.63 0.53* 0.48*  CALCIUM 8.6* 8.6* 8.2* 8.6* 8.5* 8.7* 8.7*  MG 1.6* 1.5* 1.6* 2.0 1.9  --  1.8  PHOS 2.6 2.1* 2.7 3.3 3.5  --   --    GFR: Estimated Creatinine Clearance: 98.5 mL/min (A) (by C-G formula based on SCr of 0.48 mg/dL (L)). Liver Function Tests: Recent Labs  Lab 06/09/20 2110 06/10/20 0518 06/11/20 0541  AST 24 19 33  ALT 21 22 36  ALKPHOS 122 120 134*  BILITOT 1.1 0.7 0.7  PROT 6.7 7.1 6.3*  ALBUMIN 3.2* 3.2* 2.8*   No results for input(s): LIPASE, AMYLASE in the last 168 hours. No results for input(s): AMMONIA in the last 168 hours. Coagulation Profile: Recent Labs  Lab 06/09/20 2326  INR 1.4*   Cardiac Enzymes: Recent Labs  Lab 06/10/20 0518  CKTOTAL 74   BNP (last 3 results) No results for input(s): PROBNP in the last 8760 hours. HbA1C: No results for input(s): HGBA1C in the last 72 hours. CBG: No results for input(s): GLUCAP in the last 168 hours. Lipid Profile: No results for input(s): CHOL, HDL, LDLCALC, TRIG, CHOLHDL, LDLDIRECT in the last 72 hours. Thyroid Function Tests: No results for input(s): TSH, T4TOTAL, FREET4, T3FREE, THYROIDAB in the last 72 hours. Anemia Panel: No results for input(s): VITAMINB12, FOLATE, FERRITIN, TIBC, IRON, RETICCTPCT in the last  72 hours. Sepsis Labs: Recent Labs  Lab 06/09/20 2156 06/09/20 2326 06/10/20 0518 06/11/20 0541 06/14/20 0506  PROCALCITON  --  0.84 1.02 1.05 0.23  LATICACIDVEN 1.4  --   --   --   --     Recent Results (from the past 240 hour(s))  SARS Coronavirus 2 by RT PCR (hospital order, performed in [facility] hospital lab) Nasopharyngeal Nasopharyngeal Swab     Status: None   Collection Time: 06/09/20  9:02 PM   Specimen: Nasopharyngeal Swab  Result Value Ref Range Status   SARS Coronavirus 2 NEGATIVE NEGATIVE Final    Comment: (NOTE) SARS-CoV-2 target nucleic acids are NOT DETECTED.  The SARS-CoV-2 RNA is generally detectable in upper and lower respiratory specimens  during the acute phase of infection. The lowest concentration of SARS-CoV-2 viral copies this assay can detect is 250 copies / mL. A negative result does not preclude SARS-CoV-2 infection and should not be used as the sole basis for treatment or other patient management decisions.  A negative result may occur with improper specimen collection / handling, submission of specimen other than nasopharyngeal swab, presence of viral mutation(s) within the areas targeted by this assay, and inadequate number of viral copies (<250 copies / mL). A negative result must be combined with clinical observations, patient history, and epidemiological information.  Fact Sheet for Patients:   StrictlyIdeas.no  Fact Sheet for Healthcare Providers: BankingDealers.co.za  This test is not yet approved or  cleared by the [place] FDA and has been authorized for detection and/or diagnosis of SARS-CoV-2 by FDA under an Emergency Use Authorization (EUA).  This EUA will remain in effect (meaning this test can be used) for the duration of the COVID-19 declaration under Section 564(b)(1) of the Act, 21 U.S.C. section 360bbb-3(b)(1), unless the authorization is terminated or revoked  sooner.  Performed at [facility], [address] [place], [place] [address]   Blood culture (routine x 2)     Status: None   Collection Time: 06/09/20  9:50 PM   Specimen: BLOOD  Result Value Ref Range Status   Specimen Description BLOOD LEFT ANTECUBITAL  Final   Special Requests   Final    BOTTLES DRAWN AEROBIC AND ANAEROBIC Blood Culture results may not be optimal due to an inadequate volume of blood received in culture bottles   Culture   Final    NO GROWTH 5 DAYS Performed at [facility], [address] [place], [place] [address]    Report Status 06/14/2020 FINAL  Final  Blood culture (routine x 2)     Status: None   Collection Time: 06/09/20  9:56 PM   Specimen: BLOOD  Result Value Ref Range Status   Specimen Description BLOOD RIGHT ANTECUBITAL  Final   Special Requests   Final    BOTTLES DRAWN AEROBIC AND ANAEROBIC Blood Culture results may not be optimal due to an inadequate volume of blood received in culture bottles   Culture   Final    NO GROWTH 5 DAYS Performed at [facility], [address], [place], [place] [address]    Report Status 06/14/2020 FINAL  Final  Culture, sputum-assessment     Status: None   Collection Time: 06/10/20  1:00 PM   Specimen: Expectorated Sputum  Result Value Ref Range Status   Specimen Description EXPECTORATED SPUTUM  Final   Special Requests NONE  Final   Sputum evaluation   Final    THIS SPECIMEN IS ACCEPTABLE FOR SPUTUM CULTURE Performed at [facility], [address] [place], [place] [address]    Report Status 06/10/2020 FINAL  Final  Culture, respiratory     Status: None   Collection Time: 06/10/20  1:00 PM  Result Value Ref Range Status   Specimen Description   Final    EXPECTORATED SPUTUM Performed at [facility], [address] [place], [place] [address]    Special Requests   Final    NONE Reflexed from [id number] Performed at [facility], [address] [place], [place]  [address]    Gram Stain   Final    FEW WBC PRESENT,BOTH PMN AND MONONUCLEAR MODERATE GRAM POSITIVE COCCI IN CLUSTERS RARE GRAM NEGATIVE RODS Performed at [facility], [place] [address] [place], [place] [address]    Culture MODERATE STAPHYLOCOCCUS AUREUS  Final   Report Status 06/13/2020 FINAL  Final   Organism ID, Bacteria STAPHYLOCOCCUS AUREUS  Final      Susceptibility   Staphylococcus aureus - MIC*    CIPROFLOXACIN >=8 RESISTANT Resistant     ERYTHROMYCIN <=0.25 SENSITIVE Sensitive     GENTAMICIN <=0.5 SENSITIVE Sensitive     OXACILLIN <=0.25 SENSITIVE Sensitive     TETRACYCLINE <=1 SENSITIVE Sensitive     VANCOMYCIN 1 SENSITIVE Sensitive     TRIMETH/SULFA <=10 SENSITIVE Sensitive     CLINDAMYCIN <=0.25 SENSITIVE Sensitive     RIFAMPIN <=0.5 SENSITIVE Sensitive     Inducible Clindamycin NEGATIVE Sensitive     * MODERATE STAPHYLOCOCCUS AUREUS  MRSA PCR Screening     Status: None   Collection Time: 06/11/20  9:57 AM   Specimen: Nasal Mucosa; Nasopharyngeal  Result Value Ref Range Status   MRSA by PCR NEGATIVE NEGATIVE Final    Comment:        The GeneXpert MRSA Assay (FDA approved for NASAL specimens only), is one component of a comprehensive MRSA colonization surveillance program. It is not intended to diagnose MRSA infection nor to guide or monitor treatment for MRSA infections. Performed at [facility], [place], [place] [address]   Respiratory (~20 pathogens) panel by PCR     Status: None   Collection Time: 06/11/20  7:14 PM   Specimen: Nasopharyngeal Swab; Respiratory  Result Value Ref Range Status   Adenovirus NOT DETECTED NOT DETECTED Final   Coronavirus 229E NOT DETECTED NOT DETECTED Final    Comment: (NOTE) The Coronavirus on the Respiratory Panel, DOES NOT test for the novel  Coronavirus (2019 nCoV)    Coronavirus HKU1 NOT DETECTED NOT DETECTED Final   Coronavirus NL63 NOT DETECTED NOT DETECTED Final    Coronavirus OC43 NOT DETECTED NOT DETECTED Final   Metapneumovirus NOT DETECTED NOT DETECTED Final   Rhinovirus / Enterovirus NOT DETECTED NOT DETECTED Final   Influenza A NOT DETECTED NOT DETECTED Final   Influenza B NOT DETECTED NOT DETECTED Final   Parainfluenza Virus 1 NOT DETECTED NOT DETECTED Final   Parainfluenza Virus 2 NOT DETECTED NOT DETECTED Final   Parainfluenza Virus 3 NOT DETECTED NOT DETECTED Final   Parainfluenza Virus 4 NOT DETECTED NOT DETECTED Final   Respiratory Syncytial Virus NOT DETECTED NOT DETECTED Final   Bordetella pertussis NOT DETECTED NOT DETECTED Final   Bordetella Parapertussis NOT DETECTED NOT DETECTED Final   Chlamydophila pneumoniae NOT DETECTED NOT DETECTED Final   Mycoplasma pneumoniae NOT DETECTED NOT DETECTED Final    Comment: Performed at [facility], [place], [place], [place] [address]         Radiology Studies: No results found.      Scheduled Meds: . atorvastatin  40 mg Oral Daily  . doxycycline  100 mg Oral Q12H  . enoxaparin (LOVENOX) injection  40 mg Subcutaneous Q24H  . furosemide  20 mg Intravenous Daily  . guaiFENesin  600 mg Oral BID  . ipratropium-albuterol  3 mL Nebulization Q4H  . methylPREDNISolone (SOLU-MEDROL) injection  40 mg Intravenous Q12H  . mometasone-formoterol  2 puff Inhalation BID  . montelukast  10 mg Oral QHS  . nicotine  21 mg Transdermal Daily  . pantoprazole  20 mg Oral Daily  . sodium chloride flush  10 mL Intravenous Q12H  . sodium chloride  1 g  Oral BID WC   Continuous Infusions:   LOS: 7 days    Time spent: 28 minutes    Sharen Hones, MD [facility]   To contact the attending provider between 7A-7P or the covering provider during after hours 7P-7A, please log into the web site [email or web address] and access using universal [place] password for that web site. If you do not have the password, please call the hospital operator.  06/16/2020, 9:41 AM

## 2020-06-16 NOTE — Progress Notes (Signed)
Physical Therapy Treatment Patient Details Name: Jeremiah [name] MRN: [id number] DOB: Jan 09, 1951 Today's Date: 06/16/2020    History of Present Illness presented to ER secondary to progressive SOB, cough x2-3 days; admitted for management of acute/chronic respiratory failure with hypoxia due to COPD exacerbation.  Of note, recently hospitalized (Jan, 2021) due to R foot cellulitis, covid +.    PT Comments    Pt was long sitting in bed upon arriving. Did require encouragement to participate. Once motivated was cooperative throughout. He was on 5 L o2 upon arriving however required 6L during ambulation. Did desaturate to 82% on 6 L but recovers quickly with seated rest. Recommend DC home with HHPT to follow. He was in bed at conclusion of session with call bell in reach and bed alarm in place.    Follow Up Recommendations  Home health PT     Equipment Recommendations  Rolling walker with 5" wheels;3in1 (PT)    Recommendations for Other Services       Precautions / Restrictions Precautions Precautions: Fall    Mobility  Bed Mobility Overal bed mobility: Modified Independent Bed Mobility: Supine to Sit;Sit to Supine     Supine to sit: Modified independent (Device/Increase time);HOB elevated Sit to supine: Modified independent (Device/Increase time);HOB elevated   General bed mobility comments: HOB elevated per pt request  Transfers Overall transfer level: Modified independent Equipment used: Rolling walker (2 wheeled) Transfers: Sit to/from Stand Sit to Stand: Modified independent (Device/Increase time)         General transfer comment: pt requested to use RW. easily able to stand from lowest bed height  Ambulation/Gait Ambulation/Gait assistance: Supervision Gait Distance (Feet): 75 Feet Assistive device: Rolling walker (2 wheeled) Gait Pattern/deviations: Step-through pattern Gait velocity: decrease   General Gait Details: pt ambulated 2 x 28ft on 6 L with  desaturation to 82%      Balance Overall balance assessment: Modified Independent Sitting-balance support: Feet supported Sitting balance-Leahy Scale: Good     Standing balance support: Bilateral upper extremity supported;During functional activity Standing balance-Leahy Scale: Good         Cognition Arousal/Alertness: Awake/alert Behavior During Therapy: WFL for tasks assessed/performed Overall Cognitive Status: Within Functional Limits for tasks assessed        General Comments: Pt is A and oriented. slow moving and somewhat particiular             Pertinent Vitals/Pain Pain Assessment: No/denies pain           PT Goals (current goals can now be found in the care plan section) Acute Rehab PT Goals Patient Stated Goal: to improve breathing Progress towards PT goals: Progressing toward goals    Frequency    Min 2X/week      PT Plan Current plan remains appropriate       AM-PAC PT "6 Clicks" Mobility   Outcome Measure  Help needed turning from your back to your side while in a flat bed without using bedrails?: None Help needed moving from lying on your back to sitting on the side of a flat bed without using bedrails?: None Help needed moving to and from a bed to a chair (including a wheelchair)?: None Help needed standing up from a chair using your arms (e.g., wheelchair or bedside chair)?: None Help needed to walk in hospital room?: A Little Help needed climbing 3-5 steps with a railing? : A Little 6 Click Score: 22    End of Session Equipment Utilized During Treatment: Oxygen Activity  Tolerance:  (on 5 L upon arriving) Patient left: in bed;with call bell/phone within reach Nurse Communication: Mobility status PT Visit Diagnosis: Muscle weakness (generalized) (M62.81);Difficulty in walking, not elsewhere classified (R26.2)     Time: 3744-5146 PT Time Calculation (min) (ACUTE ONLY): 22 min  Charges:  $Gait Training: 8-22 mins                      Julaine Fusi PTA 06/16/20, 10:01 AM

## 2020-06-16 NOTE — Plan of Care (Signed)

## 2020-06-16 NOTE — Progress Notes (Signed)
Pulmonary Medicine          Date: 06/16/2020,   MRN# [id number] Jeremiah [name] 1950-06-10     AdmissionWeight: 87.5 kg                 CurrentWeight: 86.9 kg   Referring physician: Dr Dwyane Dee   CHIEF COMPLAINT:   Multifocal pneumonia with acute on chronic hypoxemic respiratory failure.    HISTORY OF PRESENT ILLNESS   As per admission h/p Jeremiah [name] is a 70 y.o. male with medical history significant of COPD on home oxygen at 2 L with, HTN, tobacco abuse, hyponatremia, GERD, history of recent Covid infection.  Presented with cough and shortness of breath For the past 2 to 3 days have been having cough productive of thick sputum Noted to have elevated white blood cell count and worsening hypoxia requiring up to 6 L to maintain oxygen saturation 91%  Patient tested Covid positive about 1 month ago Last month was admitted with sepsis secondary to right foot cellulitis.  Was treated with IV vancomycin and meropenem.  Patient was found to be Covid positive on 7 January. Found to have Covid pneumonia treated for remdesivir steroids bronchodilators and completed his treatment while hospitalized.  He was discharged to SNF. Serial CXR reviewed by me shows flattening of hemidiaphragms bilaterally with interstitial opacification suugestive of chronic lung disease with component of pulmonary edema.    06/12/2020- patient feels better this am, he is weaned to 4L/min [place]. He has PT coming in today and he is excited to get OOB and ambulate. I have encouraged him to try 36f walking today.  From pulmonary perspective can initiate dc planning  06/13/2020-  Patient +resistant staph aureus likely etiology of his community acquired pneumonia.  He ambulated with PT today.  O2 weaned to 3L/min.  Na level is fluctuated still low   06/14/2020- patient is worse today, he is wheezing on exam and his O2 req is increased to 4L from 3 overnight.  He states he can hear more wheezing, and this  can happen with tapering of prednisone.  I will increase pred back to 320mfrom 20 today. We will repeat CXRE today since its been 5 days since last one.   06/16/2020- patient is still with dyspnea and on 4L/min [place].  He had PT today but did not do well wa unable to walk out of room due to hypoxemic events.  Will repeat CXR in am.   PAST MEDICAL HISTORY   Past Medical History:  Diagnosis Date  . Asthma   . COPD (chronic obstructive pulmonary disease) (HCC)    O2 dependent - 2L  . GERD (gastroesophageal reflux disease)   . HTN (hypertension)   . Oxygen dependent   . Tobacco dependence      SURGICAL HISTORY   Past Surgical History:  Procedure Laterality Date  . CATARACT EXTRACTION W/PHACO Left 12/09/2019   Procedure: CATARACT EXTRACTION PHACO AND INTRAOCULAR LENS PLACEMENT (IOC) COMPLICATED LEFT [id number]:87.5 Surgeon: PoBirder RobsonMD;  [facility] [place];  [place]: Left;  [place] CATARACT EXTRACTION W/PHACO Right 01/27/2020   Procedure: CATARACT EXTRACTION PHACO AND INTRAOCULAR LENS PLACEMENT (IOCapulinRIGHT VISION BLUE 26.83 02:07.6;  Surgeon: PoBirder RobsonMD;  [place] [place]: Ophthal[place];  Laterality: Right;  . HERNIA REPAIR    . NECK SURGERY       FAMILY HISTORY   Family History  Problem Relation  Age of Onset  . Cancer - Colon Father   . Congestive Heart Failure Mother      SOCIAL HISTORY   Social History   Tobacco Use  . Smoking status: Current Every Day Smoker    Packs/day: 0.50    Years: 50.00    Pack years: 25.00    Types: Cigarettes    Last attempt to quit: 07/28/2015    Years since quitting: 4.8  . Smokeless tobacco: Never Used  . Tobacco comment: started age 64.  Vaping Use  . Vaping Use: Never used  Substance Use Topics  . Alcohol use: Yes    Alcohol/week: 0.0 standard drinks    Comment: beers occasionally  . Drug use: No     MEDICATIONS    Home Medication:     Current Medication:  Current Facility-Administered Medications:  .  acetaminophen (TYLENOL) tablet 650 mg, 650 mg, Oral, Q6H PRN, 650 mg at 06/16/20 1612 **OR** acetaminophen (TYLENOL) suppository 650 mg, 650 mg, Rectal, Q6H PRN, Doutova, Anastassia, MD .  albuterol (PROVENTIL) (2.5 MG/3ML) 0.083% nebulizer solution 2.5 mg, 2.5 mg, Nebulization, Q2H PRN, Doutova, Anastassia, MD, 2.5 mg at 06/15/20 1937 .  albuterol (VENTOLIN HFA) 108 (90 Base) MCG/ACT inhaler 2 puff, 2 puff, Inhalation, Q4H PRN, Doutova, Anastassia, MD .  atorvastatin (LIPITOR) tablet 40 mg, 40 mg, Oral, Daily, Doutova, Anastassia, MD, 40 mg at 06/16/20 0835 .  doxycycline (VIBRA-TABS) tablet 100 mg, 100 mg, Oral, Q12H, Lanney Gins, Vaughan Garfinkle, MD, 100 mg at 06/16/20 0835 .  enoxaparin (LOVENOX) injection 40 mg, 40 mg, Subcutaneous, Q24H, Doutova, Anastassia, MD, 40 mg at 06/16/20 0834 .  furosemide (LASIX) injection 20 mg, 20 mg, Intravenous, Daily, Ottie Glazier, MD, 20 mg at 06/16/20 0835 .  guaiFENesin (MUCINEX) 12 hr tablet 600 mg, 600 mg, Oral, BID, Doutova, Anastassia, MD, 600 mg at 06/16/20 0835 .  ipratropium-albuterol (DUONEB) 0.5-2.5 (3) MG/3ML nebulizer solution 3 mL, 3 mL, Nebulization, Q6H, Sharen Hones, MD, 3 mL at 06/16/20 1346 .  methylPREDNISolone sodium succinate (SOLU-MEDROL) 40 mg/mL injection 40 mg, 40 mg, Intravenous, Q12H, Sharen Hones, MD, 40 mg at 06/16/20 1209 .  mometasone-formoterol (DULERA) 100-5 MCG/ACT inhaler 2 puff, 2 puff, Inhalation, BID, Sharen Hones, MD, 2 puff at 06/16/20 [phone] .  montelukast (SINGULAIR) tablet 10 mg, 10 mg, Oral, QHS, Doutova, Anastassia, MD, 10 mg at 06/15/20 2215 .  nicotine (NICODERM CQ - dosed in mg/24 hours) patch 21 mg, 21 mg, Transdermal, Daily, Doutova, Anastassia, MD, 21 mg at 06/16/20 0837 .  pantoprazole (PROTONIX) EC tablet 20 mg, 20 mg, Oral, Daily, Doutova, Anastassia, MD, 20 mg at 06/16/20 0834 .  sodium chloride flush (NS) 0.9 % injection 10 mL, 10 mL,  Intravenous, Q12H, Sharen Hones, MD, 10 mL at 06/16/20 5038 .  sodium chloride tablet 1 g, 1 g, Oral, BID WC, Sharen Hones, MD, 1 g at 06/16/20 1612    ALLERGIES   Penicillins     REVIEW OF SYSTEMS    Review of Systems:  Gen:  Denies  fever, sweats, chills weigh loss  HEENT: Denies blurred vision, double vision, ear pain, eye pain, hearing loss, nose bleeds, sore throat Cardiac:  No dizziness, chest pain or heaviness, chest tightness,edema Resp:  Complains of cough rpductive of phlegm Gi: Denies swallowing difficulty, stomach pain, nausea or vomiting, diarrhea, constipation, bowel incontinence Gu:  Denies bladder incontinence, burning urine Ext:   Denies Joint pain, stiffness or swelling Skin: Denies  skin rash, easy bruising or bleeding or hives Endoc:  Denies  polyuria, polydipsia , polyphagia or weight change Psych:   Denies depression, insomnia or hallucinations   Other:  All other systems negative   VS: BP 112/69 (BP Location: Right Arm)   Pulse 94   Temp 98.6 F (37 C) (Oral)   Resp 17   Ht 6' 1"  (1.854 m)   Wt 86.9 kg   SpO2 96%   BMI 25.28 kg/m      PHYSICAL EXAM    GENERAL:NAD, no fevers, chills, no weakness no fatigue HEAD: Normocephalic, atraumatic.  EYES: Pupils equal, round, reactive to light. Extraocular muscles intact. No scleral icterus.  MOUTH: Moist mucosal membrane. Dentition intact. No abscess noted.  EAR, NOSE, THROAT: Clear without exudates. No external lesions.  NECK: Supple. No thyromegaly. No nodules. No JVD.  PULMONARY: bilateral rhochi imporved from previous CARDIOVASCULAR: S1 and S2. Regular rate and rhythm. No murmurs, rubs, or gallops. No edema. Pedal pulses 2+ bilaterally.  GASTROINTESTINAL: Soft, nontender, nondistended. No masses. Positive bowel sounds. No hepatosplenomegaly.  MUSCULOSKELETAL: No swelling, clubbing, or edema. Range of motion full in all extremities.  NEUROLOGIC: Cranial nerves II through XII are intact. No gross  focal neurological deficits. Sensation intact. Reflexes intact.  SKIN: No ulceration, lesions, rashes, or cyanosis. Skin warm and dry. Turgor intact.  PSYCHIATRIC: Mood, affect within normal limits. The patient is awake, alert and oriented x 3. Insight, judgment intact.       IMAGING    DG Chest Portable 1 View  Result Date: 06/09/2020 CLINICAL DATA:  Shortness of breath EXAM: PORTABLE CHEST 1 VIEW COMPARISON:  May 14, 2020 FINDINGS: The heart size and mediastinal contours are mildly enlarged. Aortic knob calcifications are seen. Multifocal patchy interstitial opacities are seen throughout lower lungs, right greater than left. Biapical scarring is noted. No acute osseous abnormality. IMPRESSION: Multifocal airspace opacities, right greater than left, likely consistent with multifocal pneumonia Electronically Signed   By: Prudencio Pair M.D.   On: 06/09/2020 21:26   ECHOCARDIOGRAM COMPLETE  Result Date: 06/11/2020    ECHOCARDIOGRAM REPORT   Patient Name:   RIAN BUSCHE Date of Exam: 06/11/2020 Medical Rec #:  [id number]           Height:       73.0 in Accession #:    [id number]          Weight:       192.4 lb Date of Birth:  09/22/1950           BSA:          2.116 m Patient Age:    77 years            BP:           109/55 mmHg Patient Gender: M                   HR:           114 bpm. Exam Location:  [facility] Procedure: 2D Echo, Cardiac Doppler and Color Doppler Indications:     Elevated troponin  History:         Patient has prior history of Echocardiogram examinations, most                  recent 08/04/2015. COPD; Risk Factors:Hypertension. Tobacco                  dependence.  Sonographer:     Sherrie Sport RDCS (AE) Referring Phys:  [place] Diagnosing Phys: Ida Rogue MD  Sonographer Comments: Technically difficult study due to poor echo windows, no apical window and no subcostal window. Image acquisition challenging due to COPD. IMPRESSIONS  1. Left ventricular ejection fraction,  by estimation, is 60 to 65%. The left ventricle has normal function. The left ventricle has no regional wall motion abnormalities. Left ventricular diastolic parameters are indeterminate.  2. Right ventricular systolic function is normal. The right ventricular size is normal.  3. Challenging images FINDINGS  Left Ventricle: Left ventricular ejection fraction, by estimation, is 60 to 65%. The left ventricle has normal function. The left ventricle has no regional wall motion abnormalities. The left ventricular internal cavity size was normal in size. There is  no left ventricular hypertrophy. Left ventricular diastolic parameters are indeterminate. Right Ventricle: The right ventricular size is normal. No increase in right ventricular wall thickness. Right ventricular systolic function is normal. Left Atrium: Left atrial size was normal in size. Right Atrium: Right atrial size was normal in size. Pericardium: There is no evidence of pericardial effusion. Mitral Valve: The mitral valve is normal in structure. There is mild thickening of the mitral valve leaflet(s). No evidence of mitral valve regurgitation. No evidence of mitral valve stenosis. Tricuspid Valve: The tricuspid valve is normal in structure. Tricuspid valve regurgitation is not demonstrated. No evidence of tricuspid stenosis. Aortic Valve: The aortic valve was not well visualized. Aortic valve regurgitation is not visualized. Mild to moderate aortic valve sclerosis/calcification is present, without any evidence of aortic stenosis. Pulmonic Valve: The pulmonic valve was normal in structure. Pulmonic valve regurgitation is not visualized. No evidence of pulmonic stenosis. Aorta: The aortic root is normal in size and structure. Venous: The inferior vena cava is normal in size with greater than 50% respiratory variability, suggesting right atrial pressure of 3 mmHg. IAS/Shunts: No atrial level shunt detected by color flow Doppler.  LEFT VENTRICLE PLAX 2D  LVIDd:         4.38 cm LVIDs:         2.52 cm LV PW:         1.12 cm LV IVS:        1.24 cm LVOT diam:     2.00 cm LVOT Area:     3.14 cm  LEFT ATRIUM         Index LA diam:    4.00 cm 1.89 cm/m                        PULMONIC VALVE AORTA                 PV Vmax:        0.95 m/s Ao Root diam: 3.60 cm PV Peak grad:   3.6 mmHg                       RVOT Peak grad: 5 mmHg   SHUNTS Systemic Diam: 2.00 cm Ida Rogue MD Electronically signed by Ida Rogue MD Signature Date/Time: 06/11/2020/1:29:30 PM    Final           ASSESSMENT/PLAN    Acute on chronic hypoxemic respiratory failure - present on admission  - COVID19 negative   - supplemental O2 during my evaluation 6L/min- >>4L/min - will perform infectious workup for pneumonia- staph aureus + -Respiratory viral panel-negative -serum fungitell-negative -legionella ab-negative -strep pneumoniae ur AG-negative -Histoplasma Ur Ag -sputum resp cultures-GPCs in clusters staph aureus - resitant -AFB sputum expectorated specimen -sputum cytology  -reviewed pertinent  imaging with patient today - ESR/CRP -PT/OT for d/c planning  -please encourage patient to use incentive spirometer few times each hour while hospitalized.   -procalcitonin is mildly elevated - will trend -agree with zithromax and rocephin for CAP regimen at this time -wean FiO2 as able patient is generally on 2L/min [place] at home -s/p TTE - diastology could not be assessed due to poor windows, will obtain BNP today as since there is interstitial edema on current CXR.           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   Patient/Family are satisfied with care plan and all questions have been answered.  This document was prepared using Dragon voice recognition software and may include unintentional dictation errors.     Ottie Glazier, M.D.  Division of [place]

## 2020-06-17 ENCOUNTER — Inpatient Hospital Stay: Payer: Medicare HMO

## 2020-06-17 DIAGNOSIS — A419 Sepsis, unspecified organism: Secondary | ICD-10-CM | POA: Diagnosis not present

## 2020-06-17 DIAGNOSIS — J9621 Acute and chronic respiratory failure with hypoxia: Secondary | ICD-10-CM | POA: Diagnosis not present

## 2020-06-17 DIAGNOSIS — J449 Chronic obstructive pulmonary disease, unspecified: Secondary | ICD-10-CM | POA: Diagnosis not present

## 2020-06-17 DIAGNOSIS — E871 Hypo-osmolality and hyponatremia: Secondary | ICD-10-CM | POA: Diagnosis not present

## 2020-06-17 LAB — CBC WITH DIFFERENTIAL/PLATELET
Abs Immature Granulocytes: 1.42 10*3/uL — ABNORMAL HIGH (ref 0.00–0.07)
Basophils Absolute: 0.1 10*3/uL (ref 0.0–0.1)
Basophils Relative: 0 %
Eosinophils Absolute: 0 10*3/uL (ref 0.0–0.5)
Eosinophils Relative: 0 %
HCT: 30.3 % — ABNORMAL LOW (ref 39.0–52.0)
Hemoglobin: 10.7 g/dL — ABNORMAL LOW (ref 13.0–17.0)
Immature Granulocytes: 5 %
Lymphocytes Relative: 5 %
Lymphs Abs: 1.5 10*3/uL (ref 0.7–4.0)
MCH: 30.1 pg (ref 26.0–34.0)
MCHC: 35.3 g/dL (ref 30.0–36.0)
MCV: 85.1 fL (ref 80.0–100.0)
Monocytes Absolute: 1 10*3/uL (ref 0.1–1.0)
Monocytes Relative: 3 %
Neutro Abs: 25.6 10*3/uL — ABNORMAL HIGH (ref 1.7–7.7)
Neutrophils Relative %: 87 %
Platelets: 430 10*3/uL — ABNORMAL HIGH (ref 150–400)
RBC: 3.56 MIL/uL — ABNORMAL LOW (ref 4.22–5.81)
RDW: 15 % (ref 11.5–15.5)
Smear Review: NORMAL
WBC: 29.6 10*3/uL — ABNORMAL HIGH (ref 4.0–10.5)
nRBC: 0 % (ref 0.0–0.2)

## 2020-06-17 LAB — BASIC METABOLIC PANEL
Anion gap: 8 (ref 5–15)
BUN: 28 mg/dL — ABNORMAL HIGH (ref 8–23)
CO2: 29 mmol/L (ref 22–32)
Calcium: 8.4 mg/dL — ABNORMAL LOW (ref 8.9–10.3)
Chloride: 92 mmol/L — ABNORMAL LOW (ref 98–111)
Creatinine, Ser: 0.58 mg/dL — ABNORMAL LOW (ref 0.61–1.24)
GFR, Estimated: >60 mL/min (ref >60)
Glucose, Bld: 140 mg/dL — ABNORMAL HIGH (ref 70–99)
Potassium: 4.6 mmol/L (ref 3.5–5.1)
Sodium: 129 mmol/L — ABNORMAL LOW (ref 135–145)

## 2020-06-17 MED ORDER — FUROSEMIDE 40 MG PO TABS: 40.0000 mg | ORAL_TABLET | Freq: Every day | ORAL | 0 refills | Status: DC

## 2020-06-17 MED ORDER — DOXYCYCLINE HYCLATE 100 MG PO TABS: 100.0000 mg | ORAL_TABLET | Freq: Two times a day (BID) | ORAL | 0 refills | Status: AC

## 2020-06-17 MED ORDER — PREDNISONE 10 MG PO TABS: ORAL_TABLET | ORAL | 0 refills | Status: AC

## 2020-06-17 MED ORDER — SODIUM CHLORIDE 1 G PO TABS: 1.0000 g | ORAL_TABLET | Freq: Every day | ORAL | 0 refills | Status: DC

## 2020-06-17 NOTE — Care Management Important Message (Signed)
Important Message  Patient Details  Name: Clemon Devaul MRN: [id number] Date of Birth: 08/10/1950   Medicare Important Message Given:  No  Patient discharged prior to arrival to unit to deliver concurrent Medicare IM.    Dannette Barbara 06/17/2020, 2:07 PM

## 2020-06-17 NOTE — TOC Initial Note (Signed)
Transition of Care [facility]) - Initial/Assessment Note    Patient Details  Name: Jeremiah [name] MRN: [id number] Date of Birth: Sep 30, 1950  Transition of Care [facility]) CM/SW Contact:    Eileen Stanford, LCSW Phone Number: 06/17/2020, 9:46 AM  Clinical Narrative:     CSW spoke with pt and pt states he does not want home health stating he has plenty of people at home to help him move around. Pt states he lives with friends. Pt states he also does not want outpatient and states if he needs therapy he will get it at his doctors office. Pt goes to [facility]. Pt also gets his medications from there. Pt's friend [name] takes him to his doctor appointments. Pt is accepting a 3n1. NO additional needs at this time.              Expected Discharge Plan: Home/Self Care Barriers to Discharge: Continued Medical Work up   Patient Goals and CMS Choice Patient states their goals for this hospitalization and ongoing recovery are:: to go home   Choice offered to / list presented to : Patient  Expected Discharge Plan and Services Expected Discharge Plan: Home/Self Care In-house Referral: Clinical Social Work   Post Acute Care Choice: NA Living arrangements for the past 2 months: [name]                 DME Arranged: 3-N-1 DME Agency: [employer] Date DME Agency Contacted: 06/17/20 Time DME Agency Contacted: [phone] Representative spoke with at DME Agency: patricia            Prior Living Arrangements/Services Living arrangements for the past 2 months: [name] Lives with:: Friends Patient language and need for interpreter reviewed:: Yes Do you feel safe going back to the place where you live?: Yes      Need for Family Participation in Patient Care: Yes (Comment) Care giver support system in place?: Yes (comment)   Criminal Activity/Legal Involvement Pertinent to Current Situation/Hospitalization: No - Comment as needed  Activities of Daily Living Home Assistive  Devices/Equipment: None ADL Screening (condition at time of admission) Patient's cognitive ability adequate to safely complete daily activities?: Yes Is the patient deaf or have difficulty hearing?: No Does the patient have difficulty seeing, even when wearing glasses/contacts?: No Does the patient have difficulty concentrating, remembering, or making decisions?: No Patient able to express need for assistance with ADLs?: Yes Does the patient have difficulty dressing or bathing?: Yes Independently performs ADLs?: Yes (appropriate for developmental age) Does the patient have difficulty walking or climbing stairs?: Yes Weakness of Legs: Both Weakness of Arms/Hands: Both  Permission Sought/Granted      Share Information with NAME: [name]     Permission granted to share info w Relationship: friend     Emotional Assessment Appearance:: Appears stated age Attitude/Demeanor/Rapport: Engaged Affect (typically observed): Accepting,Appropriate Orientation: : Oriented to Self,Oriented to Place,Oriented to  Time,Oriented to Situation Alcohol / Substance Use: Not Applicable Psych Involvement: No (comment)  Admission diagnosis:  Healthcare-associated pneumonia [J18.9] Sepsis ([place]) [A41.9] Sepsis, due to unspecified organism, unspecified whether acute organ dysfunction present [facility]) [A41.9] Patient Active Problem List   Diagnosis Date Noted  . Multifocal pneumonia 06/16/2020  . Cellulitis 05/15/2020  . COPD with chronic bronchitis ([place]) 05/14/2020  . Nicotine dependence 05/14/2020  . Chronic respiratory failure with hypoxia ([place]) 05/14/2020  . Cellulitis of right foot 05/14/2020  . Hyponatremia 05/14/2020  . Sepsis ([place]) 05/14/2020  . HTN (hypertension) 05/14/2020  .  Abnormal LFTs 05/14/2020  . Acute on chronic respiratory failure with hypoxia ([place]) 10/15/2016  . Acute bronchitis 10/15/2016  . Tobacco dependency 10/15/2016  . COPD with acute exacerbation ([place]) 07/28/2015   PCP:   System, Provider Not In Pharmacy:   [facility] [place], [address] [place] [address] Phone: [phone] Fax: [place], [place] [address] [place] [address] Phone: [phone] Fax: [phone]     Social Determinants of Health (SDOH) Interventions    Readmission Risk Interventions Readmission Risk Prevention Plan 06/17/2020  Transportation Screening Complete  PCP or Specialist Appt within 3-5 Days Complete  HRI or [place] Complete  Social Work Consult for [place] Planning/Counseling Complete  Palliative Care Screening Not Applicable  Medication Review Press photographer) Complete  Some recent data might be hidden

## 2020-06-17 NOTE — Plan of Care (Signed)

## 2020-06-17 NOTE — Progress Notes (Signed)
Patient given discharge instructions and reviewed medications with patient. Patient will obtain his home medications back from previous rehab facility. Patient states his ride home can get the medications for him. Patient telemetry d/c and IV removed. Discharged with all personal belongings, oxygen tank, BSC and walker. Escorted by volunteer services via wheelchair.

## 2020-06-17 NOTE — Discharge Summary (Signed)
Physician Discharge Summary  Patient ID: Jeremiah [name] MRN: [id number] DOB/AGE: [id number] 70 y.o.  Admit date: 06/09/2020 Discharge date: 06/17/2020  Admission Diagnoses:  Discharge Diagnoses:  Active Problems:   Acute on chronic respiratory failure with hypoxia (HCC)   Tobacco dependency   COPD with chronic bronchitis (HCC)   Hyponatremia   Sepsis ([place])   HTN (hypertension)   Multifocal pneumonia   Discharged Condition: [facility] Course:  This patient is 70 years old male with PMH significant for COPD on home oxygen at 2 L, hypertension, tobacco abuse, hyponatremia, GERD, history of recent Covid infection presents in the emergency department with productive cough and shortness of breath. He is found to be hypoxic, requiring 6 L of oxygen to maintain saturation above 91%, found to have leukocytosis. He was hospitalized last month for Covid pneumonia and was treated with remdesivir, steroids bronchodilators and has completed treatment while hospitalized. Patient is admitted for sepsis secondary to multifocal pneumonia and acute on chronic hypoxic respiratory failure requiring 6 L of oxygen.  #1 Severe sepsis secondary to MSSA pneumonia. Acute on chronic hypoxemic respiratory failure secondary to pneumonia Multifocal pneumonia due to MSSA Acute on chronic diastolic congestive heart failure likely.  Not able to confirm with echocardiogram. Patient pneumonia had improved, completed Zithromax and Rocephin, currently on doxycycline for COPD exacerbation and MSSA. Patient was treated with steroids, restarted steroids IV 2 days ago. Patient was also given lower dose diuretics with Lasix for possible acute on chronic diastolic congestive heart failure.  But echocardiogram could not be evaluated due to poor windows from COPD. Patient feel much better today, I decreased patient oxygen to 3 L, patient doing well on it.  At this point, he is medically stable to be discharged. Based  on my evaluation, patient COPD is probably approaching end-stage, patient be followed by pulmonology in the near future.  May consider palliative care.  #2.  Hyponatremia secondary to SIADH. Continue fluid restriction. Patient be continued on salt tablets as outpatient, Lasix 40 mg daily is represcribed. HCTZ discontinued  3.  Elevated troponin secondary to demand ischemia due to sepsis   Consults: pulmonary/intensive care  Significant Diagnostic Studies:   Treatments: antibiotics and steroids  Discharge Exam: Blood pressure 126/72, pulse 84, temperature 97.7 F (36.5 C), temperature source Oral, resp. rate 16, height 6\' 1"  (1.854 m), weight 85 kg, SpO2 95 %. General appearance: alert and cooperative Resp: Decreased breathing sounds without crackles or wheezes. Cardio: regular rate and rhythm, S1, S2 normal, no murmur, click, rub or gallop GI: soft, non-tender; bowel sounds normal; no masses,  no organomegaly Extremities: extremities normal, atraumatic, no cyanosis or edema  Disposition: Discharge disposition: 01-Home or Self Care       Discharge Instructions    Diet general   Complete by: As directed    Regular, low fat, fluid restriction <1565ml/day   Increase activity slowly   Complete by: As directed      Allergies as of 06/17/2020      Reactions   Penicillins Anaphylaxis, Swelling, Other (See Comments)   Tolerated Ceftriaxone 06/2020 05/2020 childhood reaction and his mother told him that he "swelled up." Had a PCN reaction causing immediate rash, facial/tongue/throat swelling, SOB or lightheadedness with hypotension: Yes Had a PCN reaction causing severe rash involving mucus membranes or skin necrosis: No Had a PCN reaction that required hospitalization No Had a PCN reaction occurring within the last 10 years: No If all the above answers are "NO", may proceed with  Cephalosporin use.      Medication List    STOP taking these medications   hydrochlorothiazide  25 MG tablet Commonly known as: HYDRODIURIL     TAKE these medications   acetaminophen 325 MG tablet Commonly known as: TYLENOL Take 2 tablets (650 mg total) by mouth every 6 (six) hours as needed for mild pain (or Fever >/= 101).   albuterol 108 (90 Base) MCG/ACT inhaler Commonly known as: VENTOLIN HFA Inhale into the lungs every 6 (six) hours as needed for wheezing or shortness of breath.   atorvastatin 40 MG tablet Commonly known as: LIPITOR Take 40 mg by mouth daily.   Breo Ellipta 100-25 MCG/INH Aepb Generic drug: fluticasone furoate-vilanterol Take 1 puff by mouth daily.   diltiazem 120 MG tablet Commonly known as: Cardizem Take 1 tablet (120 mg total) by mouth 4 (four) times daily.   docusate sodium 100 MG capsule Commonly known as: COLACE Take 100 mg by mouth daily as needed for mild constipation.   furosemide 40 MG tablet Commonly known as: Lasix Take 1 tablet (40 mg total) by mouth daily.   Combivent Respimat 20-100 MCG/ACT Aers respimat Generic drug: Ipratropium-Albuterol Inhale 1 puff into the lungs every 4 (four) hours.   ipratropium-albuterol 0.5-2.5 (3) MG/3ML Soln Commonly known as: DUONEB Take 3 mLs by nebulization every 4 (four) hours.   lisinopril 10 MG tablet Commonly known as: ZESTRIL Take 10 mg by mouth daily.   montelukast 10 MG tablet Commonly known as: SINGULAIR Take 10 mg by mouth at bedtime.   nicotine 21 mg/24hr patch Commonly known as: NICODERM CQ - dosed in mg/24 hours Place 21 mg onto the skin daily.   ondansetron 4 MG disintegrating tablet Commonly known as: ZOFRAN-ODT Take 4 mg by mouth every 6 (six) hours as needed for nausea or vomiting.   OXYGEN Inhale 2 L into the lungs at bedtime. And as needed   pantoprazole 20 MG tablet Commonly known as: PROTONIX Take 20 mg by mouth daily.   predniSONE 10 MG tablet Commonly known as: DELTASONE Take 4 tablets (40 mg total) by mouth 2 (two) times daily with a meal for 3 days,  THEN 4 tablets (40 mg total) daily with breakfast for 3 days, THEN 2 tablets (20 mg total) daily with breakfast for 3 days, THEN 1 tablet (10 mg total) daily with breakfast for 3 days. Start taking on: June 17, 2020   sodium chloride 1 g tablet Take 1 tablet (1 g total) by mouth daily.   tiotropium 18 MCG inhalation capsule Commonly known as: SPIRIVA Place 18 mcg into inhaler and inhale daily.            Durable Medical Equipment  (From admission, onward)         Start     Ordered   06/16/20 0951  For home use only DME 3 n 1  Once        06/16/20 0950   06/16/20 0950  For home use only DME Walker rolling  Once       Question Answer Comment  Walker: With 5 Inch Wheels   Patient needs a walker to treat with the following condition COPD exacerbation ([place])      06/16/20 0950          Follow-up Information    Ottie Glazier, MD Follow up in 1 week(s).   Specialty: Pulmonary Disease Contact information: [place] [address] [phone]  35 minutes  Signed: Sharen Hones 06/17/2020, 10:02 AM

## 2020-09-26 ENCOUNTER — Other Ambulatory Visit: Payer: Self-pay

## 2020-09-26 ENCOUNTER — Emergency Department: Payer: Medicare HMO

## 2020-09-26 ENCOUNTER — Inpatient Hospital Stay
Admission: EM | Admit: 2020-09-26 | Discharge: 2020-09-30 | DRG: 190 | Disposition: A | Discharge status: Home / Self Care | Payer: Medicare HMO | Attending: Internal Medicine | Admitting: Internal Medicine

## 2020-09-26 ENCOUNTER — Encounter: Payer: Self-pay | Admitting: Emergency Medicine

## 2020-09-26 DIAGNOSIS — Z8249 Family history of ischemic heart disease and other diseases of the circulatory system: Secondary | ICD-10-CM

## 2020-09-26 DIAGNOSIS — F17210 Nicotine dependence, cigarettes, uncomplicated: Secondary | ICD-10-CM | POA: Diagnosis present

## 2020-09-26 DIAGNOSIS — Z7951 Long term (current) use of inhaled steroids: Secondary | ICD-10-CM

## 2020-09-26 DIAGNOSIS — Z9842 Cataract extraction status, left eye: Secondary | ICD-10-CM

## 2020-09-26 DIAGNOSIS — E222 Syndrome of inappropriate secretion of antidiuretic hormone: Secondary | ICD-10-CM | POA: Diagnosis present

## 2020-09-26 DIAGNOSIS — F17200 Nicotine dependence, unspecified, uncomplicated: Secondary | ICD-10-CM | POA: Diagnosis present

## 2020-09-26 DIAGNOSIS — Z880 Allergy status to penicillin: Secondary | ICD-10-CM | POA: Diagnosis not present

## 2020-09-26 DIAGNOSIS — Z716 Tobacco abuse counseling: Secondary | ICD-10-CM

## 2020-09-26 DIAGNOSIS — K219 Gastro-esophageal reflux disease without esophagitis: Secondary | ICD-10-CM | POA: Diagnosis present

## 2020-09-26 DIAGNOSIS — Z20822 Contact with and (suspected) exposure to covid-19: Secondary | ICD-10-CM | POA: Diagnosis present

## 2020-09-26 DIAGNOSIS — Z961 Presence of intraocular lens: Secondary | ICD-10-CM | POA: Diagnosis present

## 2020-09-26 DIAGNOSIS — J441 Chronic obstructive pulmonary disease with (acute) exacerbation: Secondary | ICD-10-CM | POA: Diagnosis present

## 2020-09-26 DIAGNOSIS — Z9841 Cataract extraction status, right eye: Secondary | ICD-10-CM

## 2020-09-26 DIAGNOSIS — E871 Hypo-osmolality and hyponatremia: Secondary | ICD-10-CM | POA: Diagnosis not present

## 2020-09-26 DIAGNOSIS — J189 Pneumonia, unspecified organism: Secondary | ICD-10-CM | POA: Diagnosis present

## 2020-09-26 DIAGNOSIS — Z79899 Other long term (current) drug therapy: Secondary | ICD-10-CM

## 2020-09-26 DIAGNOSIS — I10 Essential (primary) hypertension: Secondary | ICD-10-CM | POA: Diagnosis present

## 2020-09-26 DIAGNOSIS — Z9981 Dependence on supplemental oxygen: Secondary | ICD-10-CM | POA: Diagnosis not present

## 2020-09-26 DIAGNOSIS — J9621 Acute and chronic respiratory failure with hypoxia: Secondary | ICD-10-CM | POA: Diagnosis present

## 2020-09-26 LAB — CBC WITH DIFFERENTIAL/PLATELET
Abs Immature Granulocytes: 0.03 10*3/uL (ref 0.00–0.07)
Basophils Absolute: 0.1 10*3/uL (ref 0.0–0.1)
Basophils Relative: 1 %
Eosinophils Absolute: 0.9 10*3/uL — ABNORMAL HIGH (ref 0.0–0.5)
Eosinophils Relative: 8 %
HCT: 34.8 % — ABNORMAL LOW (ref 39.0–52.0)
Hemoglobin: 12.2 g/dL — ABNORMAL LOW (ref 13.0–17.0)
Immature Granulocytes: 0 %
Lymphocytes Relative: 7 %
Lymphs Abs: 0.8 10*3/uL (ref 0.7–4.0)
MCH: 29.7 pg (ref 26.0–34.0)
MCHC: 35.1 g/dL (ref 30.0–36.0)
MCV: 84.7 fL (ref 80.0–100.0)
Monocytes Absolute: 0.9 10*3/uL (ref 0.1–1.0)
Monocytes Relative: 9 %
Neutro Abs: 7.9 10*3/uL — ABNORMAL HIGH (ref 1.7–7.7)
Neutrophils Relative %: 75 %
Platelets: 329 10*3/uL (ref 150–400)
RBC: 4.11 MIL/uL — ABNORMAL LOW (ref 4.22–5.81)
RDW: 14.6 % (ref 11.5–15.5)
WBC: 10.6 10*3/uL — ABNORMAL HIGH (ref 4.0–10.5)
nRBC: 0 % (ref 0.0–0.2)

## 2020-09-26 LAB — BASIC METABOLIC PANEL
Anion gap: 10 (ref 5–15)
BUN: 26 mg/dL — ABNORMAL HIGH (ref 8–23)
CO2: 25 mmol/L (ref 22–32)
Calcium: 8.7 mg/dL — ABNORMAL LOW (ref 8.9–10.3)
Chloride: 91 mmol/L — ABNORMAL LOW (ref 98–111)
Creatinine, Ser: 1.01 mg/dL (ref 0.61–1.24)
GFR, Estimated: >60 mL/min (ref >60)
Glucose, Bld: 106 mg/dL — ABNORMAL HIGH (ref 70–99)
Potassium: 4.4 mmol/L (ref 3.5–5.1)
Sodium: 126 mmol/L — ABNORMAL LOW (ref 135–145)

## 2020-09-26 LAB — BLOOD GAS, VENOUS
Acid-Base Excess: 1.5 mmol/L (ref 0.0–2.0)
Bicarbonate: 27.7 mmol/L (ref 20.0–28.0)
O2 Saturation: 62.6 %
Patient temperature: 37
pCO2, Ven: 49 mmHg (ref 44.0–60.0)
pH, Ven: 7.36 (ref 7.250–7.430)
pO2, Ven: 34 mmHg (ref 32.0–45.0)

## 2020-09-26 LAB — TROPONIN I (HIGH SENSITIVITY): Troponin I (High Sensitivity): 11 ng/L (ref <18)

## 2020-09-26 LAB — BRAIN NATRIURETIC PEPTIDE: B Natriuretic Peptide: 20 pg/mL (ref 0.0–100.0)

## 2020-09-26 LAB — RESP PANEL BY RT-PCR (FLU A&B, COVID) ARPGX2
Influenza A by PCR: NEGATIVE
Influenza B by PCR: NEGATIVE
SARS Coronavirus 2 by RT PCR: NEGATIVE

## 2020-09-26 MED ORDER — ALBUTEROL SULFATE (2.5 MG/3ML) 0.083% IN NEBU
INHALATION_SOLUTION | RESPIRATORY_TRACT | Status: AC
  Administered 2020-09-26: 2.5 mg via RESPIRATORY_TRACT
  Filled 2020-09-26: qty 3

## 2020-09-26 MED ORDER — SODIUM CHLORIDE 0.9 % IV SOLN
2.0000 g | Freq: Three times a day (TID) | INTRAVENOUS | Status: DC
  Administered 2020-09-26 – 2020-09-30 (×11): 2 g via INTRAVENOUS
  Filled 2020-09-26 (×14): qty 2

## 2020-09-26 MED ORDER — MONTELUKAST SODIUM 10 MG PO TABS
10.0000 mg | ORAL_TABLET | Freq: Every day | ORAL | Status: DC
  Administered 2020-09-26 – 2020-09-29 (×4): 10 mg via ORAL
  Filled 2020-09-26 (×4): qty 1

## 2020-09-26 MED ORDER — NICOTINE 21 MG/24HR TD PT24
21.0000 mg | MEDICATED_PATCH | Freq: Every day | TRANSDERMAL | Status: DC
  Administered 2020-09-26 – 2020-09-30 (×5): 21 mg via TRANSDERMAL
  Filled 2020-09-26 (×6): qty 1

## 2020-09-26 MED ORDER — PREDNISONE 20 MG PO TABS
40.0000 mg | ORAL_TABLET | Freq: Every day | ORAL | Status: DC
  Administered 2020-09-27 – 2020-09-30 (×4): 40 mg via ORAL
  Filled 2020-09-26 (×4): qty 2

## 2020-09-26 MED ORDER — FLUTICASONE FUROATE-VILANTEROL 100-25 MCG/INH IN AEPB
1.0000 | INHALATION_SPRAY | Freq: Every day | RESPIRATORY_TRACT | Status: DC
  Administered 2020-09-27 – 2020-09-30 (×4): 1 via RESPIRATORY_TRACT
  Filled 2020-09-26: qty 28

## 2020-09-26 MED ORDER — PANTOPRAZOLE SODIUM 20 MG PO TBEC
20.0000 mg | DELAYED_RELEASE_TABLET | Freq: Every day | ORAL | Status: DC
  Administered 2020-09-27 – 2020-09-28 (×2): 20 mg via ORAL
  Filled 2020-09-26 (×3): qty 1

## 2020-09-26 MED ORDER — ATORVASTATIN CALCIUM 20 MG PO TABS
40.0000 mg | ORAL_TABLET | Freq: Every day | ORAL | Status: DC
  Administered 2020-09-26 – 2020-09-30 (×5): 40 mg via ORAL
  Filled 2020-09-26 (×5): qty 2

## 2020-09-26 MED ORDER — SODIUM CHLORIDE 0.9% FLUSH
3.0000 mL | Freq: Two times a day (BID) | INTRAVENOUS | Status: DC
  Administered 2020-09-26 – 2020-09-30 (×6): 3 mL via INTRAVENOUS

## 2020-09-26 MED ORDER — ALBUTEROL SULFATE (2.5 MG/3ML) 0.083% IN NEBU: 2.5000 mg | INHALATION_SOLUTION | Freq: Once | RESPIRATORY_TRACT | Status: AC

## 2020-09-26 MED ORDER — IPRATROPIUM-ALBUTEROL 0.5-2.5 (3) MG/3ML IN SOLN
3.0000 mL | Freq: Once | RESPIRATORY_TRACT | Status: AC
  Administered 2020-09-26: 3 mL via RESPIRATORY_TRACT
  Filled 2020-09-26: qty 3

## 2020-09-26 MED ORDER — IPRATROPIUM-ALBUTEROL 0.5-2.5 (3) MG/3ML IN SOLN
3.0000 mL | Freq: Four times a day (QID) | RESPIRATORY_TRACT | Status: DC
  Administered 2020-09-27 – 2020-09-30 (×14): 3 mL via RESPIRATORY_TRACT
  Filled 2020-09-26 (×14): qty 3

## 2020-09-26 MED ORDER — METHYLPREDNISOLONE SODIUM SUCC 125 MG IJ SOLR
125.0000 mg | Freq: Once | INTRAMUSCULAR | Status: AC
  Administered 2020-09-26: 125 mg via INTRAVENOUS
  Filled 2020-09-26: qty 2

## 2020-09-26 MED ORDER — ENOXAPARIN SODIUM 40 MG/0.4ML IJ SOSY
40.0000 mg | PREFILLED_SYRINGE | INTRAMUSCULAR | Status: DC
  Administered 2020-09-26 – 2020-09-29 (×4): 40 mg via SUBCUTANEOUS
  Filled 2020-09-26 (×4): qty 0.4

## 2020-09-26 MED ORDER — SODIUM CHLORIDE 0.9% FLUSH: 3.0000 mL | INTRAVENOUS | Status: DC | PRN

## 2020-09-26 MED ORDER — LISINOPRIL 10 MG PO TABS: 10.0000 mg | ORAL_TABLET | Freq: Every day | ORAL | Status: DC

## 2020-09-26 MED ORDER — DOCUSATE SODIUM 100 MG PO CAPS
100.0000 mg | ORAL_CAPSULE | Freq: Every day | ORAL | Status: DC | PRN
  Administered 2020-09-29: 100 mg via ORAL
  Filled 2020-09-26: qty 1

## 2020-09-26 MED ORDER — DILTIAZEM HCL 60 MG PO TABS: 120.0000 mg | ORAL_TABLET | Freq: Four times a day (QID) | ORAL | Status: DC

## 2020-09-26 MED ORDER — SODIUM CHLORIDE 0.9 % IV SOLN
250.0000 mL | INTRAVENOUS | Status: DC | PRN
  Administered 2020-09-27: 250 mL via INTRAVENOUS

## 2020-09-26 MED ORDER — LEVOFLOXACIN IN D5W 750 MG/150ML IV SOLN
750.0000 mg | Freq: Once | INTRAVENOUS | Status: AC
  Administered 2020-09-26: 750 mg via INTRAVENOUS
  Filled 2020-09-26: qty 150

## 2020-09-26 MED ORDER — VANCOMYCIN HCL 1500 MG/300ML IV SOLN
1500.0000 mg | Freq: Once | INTRAVENOUS | Status: DC
  Filled 2020-09-26: qty 300

## 2020-09-26 MED ORDER — ALBUTEROL SULFATE (2.5 MG/3ML) 0.083% IN NEBU: 2.5000 mg | INHALATION_SOLUTION | RESPIRATORY_TRACT | Status: DC | PRN

## 2020-09-26 NOTE — ED Triage Notes (Signed)
Pt via EMS from home. Pt c/o SOB for the past 3 days. Pt 2L [place] chronically and found 74%. EMS gave 2 Duoneb en route. On arrival, pt O2 87% on baseline O2. Pt also c/o CP. Pt is A&Ox4 and NAD.

## 2020-09-26 NOTE — ED Notes (Signed)
Pt presents to the ED for SOB with exertion for the past 3 days. Pt states that he has been SOB but it has gotten worse today. Per EMS, pt's initial O2 sat on baseline 2L [place] was 74%. On arrival, pt was on 6L [place]. EMS gave 2 Duonebs en route. Pt has a hx of COPD. Also c/o centralized non-radiating chest pain. Denies NVD. Pt does have productive cough and was recently hospitalized for PNA. Pt is A&Ox4 and NAD.

## 2020-09-26 NOTE — Plan of Care (Signed)

## 2020-09-26 NOTE — ED Provider Notes (Signed)
[facility] Emergency Department Provider Note ____________________________________________   Event Date/Time   First MD Initiated Contact with Patient 09/26/20 1341     (approximate)  I have reviewed the triage vital signs and the nursing notes.   HISTORY  Chief Complaint Shortness of Breath  HPI Jeremiah [name] is a 70 y.o. male with history of COPD, hypertension, acute on chronic respiratory failure with hypoxia, presents to the emergency department for treatment and evaluation of shortness of breath.  Patient states that he is on 2 L of home O2 at baseline.  He states that he has a pulse ox and for the last 2 days it has been in the low 80s with any exertion. First Responder's reported to EMS that O2 sat was 74% with his 2 liters. States that he has been using his albuterol frequently throughout the day which has not provided him any relief.  Upon EMS arrival, they report that the house was extremely hot.  He had an oscillating fan very close to his face trying to cool down.  In route he received 1 DuoNeb which has not yet finished. He denies chest pain, nausea, vomiting or fever. Appetite has been decreased. He has had a productive cough with green sputum.          Past Medical History:  Diagnosis Date  . Asthma   . COPD (chronic obstructive pulmonary disease) (HCC)    O2 dependent - 2L  . GERD (gastroesophageal reflux disease)   . HTN (hypertension)   . Oxygen dependent   . Tobacco dependence     Patient Active Problem List   Diagnosis Date Noted  . Multifocal pneumonia 06/16/2020  . Cellulitis 05/15/2020  . COPD with chronic bronchitis ([place]) 05/14/2020  . Nicotine dependence 05/14/2020  . Chronic respiratory failure with hypoxia ([place]) 05/14/2020  . Cellulitis of right foot 05/14/2020  . Hyponatremia 05/14/2020  . Sepsis ([place]) 05/14/2020  . HTN (hypertension) 05/14/2020  . Abnormal LFTs 05/14/2020  . Acute on chronic respiratory failure  with hypoxia ([place]) 10/15/2016  . Acute bronchitis 10/15/2016  . Tobacco dependency 10/15/2016  . COPD with acute exacerbation ([place]) 07/28/2015    Past Surgical History:  Procedure Laterality Date  . CATARACT EXTRACTION W/PHACO Left 12/09/2019   Procedure: CATARACT EXTRACTION PHACO AND INTRAOCULAR LENS PLACEMENT (IOC) COMPLICATED LEFT 35.59 74:16.3;  Surgeon: Birder Robson, MD;  Location: [facility];  Service: Ophthalmology;  Laterality: Left;  [place]  . CATARACT EXTRACTION W/PHACO Right 01/27/2020   Procedure: CATARACT EXTRACTION PHACO AND INTRAOCULAR LENS PLACEMENT ([place]) RIGHT VISION BLUE 26.83 02:07.6;  Surgeon: Birder Robson, MD;  Location: [facility];  Service: Ophthalmology;  Laterality: Right;  . HERNIA REPAIR    . NECK SURGERY      Prior to Admission medications   Medication Sig Start Date End Date Taking? Authorizing Provider  acetaminophen (TYLENOL) 325 MG tablet Take 2 tablets (650 mg total) by mouth every 6 (six) hours as needed for mild pain (or Fever >/= 101). 08/06/15   Dustin Flock, MD  albuterol (VENTOLIN HFA) 108 (90 Base) MCG/ACT inhaler Inhale into the lungs every 6 (six) hours as needed for wheezing or shortness of breath.    [provider]  atorvastatin (LIPITOR) 40 MG tablet Take 40 mg by mouth daily.    [provider]  BREO ELLIPTA 100-25 MCG/INH AEPB Take 1 puff by mouth daily. 09/21/16   [provider]  diltiazem (CARDIZEM) 120 MG tablet Take  1 tablet (120 mg total) by mouth 4 (four) times daily. 08/06/15   Dustin Flock, MD  docusate sodium (COLACE) 100 MG capsule Take 100 mg by mouth daily as needed for mild constipation.    [provider]  furosemide (LASIX) 40 MG tablet Take 1 tablet (40 mg total) by mouth daily. 06/17/20 07/17/20  Sharen Hones, MD  Ipratropium-Albuterol (COMBIVENT RESPIMAT) 20-100 MCG/ACT AERS respimat Inhale 1 puff into the lungs every 4 (four) hours.    [provider]  ipratropium-albuterol (DUONEB) 0.5-2.5 (3) MG/3ML SOLN Take 3 mLs by nebulization every 4 (four) hours. Patient not taking: Reported on 06/10/2020 08/06/15   Dustin Flock, MD  lisinopril (PRINIVIL,ZESTRIL) 10 MG tablet Take 10 mg by mouth daily.    [provider]  montelukast (SINGULAIR) 10 MG tablet Take 10 mg by mouth at bedtime.    [provider]  nicotine (NICODERM CQ - DOSED IN MG/24 HOURS) 21 mg/24hr patch Place 21 mg onto the skin daily.    [provider]  ondansetron (ZOFRAN-ODT) 4 MG disintegrating tablet Take 4 mg by mouth every 6 (six) hours as needed for nausea or vomiting.    [provider]  OXYGEN Inhale 2 L into the lungs at bedtime. And as needed    [provider]  pantoprazole (PROTONIX) 20 MG tablet Take 20 mg by mouth daily.    [provider]  sodium chloride 1 g tablet Take 1 tablet (1 g total) by mouth daily. 06/17/20   Sharen Hones, MD  tiotropium (SPIRIVA) 18 MCG inhalation capsule Place 18 mcg into inhaler and inhale daily.    [provider]    Allergies Penicillins  Family History  Problem Relation Age of Onset  . Cancer - Colon Father   . Congestive Heart Failure Mother     Social History Social History   Tobacco Use  . Smoking status: Current Every Day Smoker    Packs/day: 0.50    Years: 50.00    Pack years: 25.00    Types: Cigarettes    Last attempt to quit: 07/28/2015    Years since quitting: 5.1  . Smokeless tobacco: Never Used  . Tobacco comment: started age 50.  Vaping Use  . Vaping Use: Never used  Substance Use Topics  . Alcohol use: Yes    Alcohol/week: 0.0 standard drinks    Comment: beers occasionally  . Drug use: No    Review of Systems  Constitutional: No fever/chills Eyes: No visual changes. ENT: No sore throat. Cardiovascular: Denies chest pain. Respiratory: Positive for shortness of breath. Gastrointestinal: No abdominal pain.  No nausea, no  vomiting.  No diarrhea.  No constipation. Genitourinary: Negative for dysuria. Musculoskeletal: Negative for back pain. Skin: Negative for rash. Neurological: Negative for headaches, focal weakness or numbness. ____________________________________________   PHYSICAL EXAM:  VITAL SIGNS: ED Triage Vitals [09/26/20 1337]  Enc Vitals Group     BP 101/60     Pulse Rate 74     Resp 20     Temp 97.8 F (36.6 C)     Temp Source Oral     SpO2 (!) 87 %     Weight 158 lb (71.7 kg)     Height 6' (1.829 m)     Head Circumference      Peak Flow      Pain Score 8     Pain Loc      Pain Edu?      Excl. in [place]?  Constitutional: Alert and oriented.Chronically ill appearing and in no acute distress. Eyes: Conjunctivae are normal. Head: Atraumatic. Nose: No congestion/rhinnorhea. Mouth/Throat: Mucous membranes are moist.. Neck: No stridor.   Hematological/Lymphatic/Immunilogical: No cervical lymphadenopathy. Cardiovascular: Normal rate, regular rhythm. Grossly normal heart sounds.  Good peripheral circulation. Respiratory: Normal respiratory effort.  No retractions. Lungs CTAB. Gastrointestinal: Soft and nontender. No distention. No abdominal bruits. No CVA tenderness. Genitourinary:  Musculoskeletal: No lower extremity tenderness nor edema.  No joint effusions. Neurologic:  Normal speech and language. No gross focal neurologic deficits are appreciated. No gait instability. Skin:  Skin is warm, dry and intact. No rash noted. Psychiatric: Mood and affect are normal. Speech and behavior are normal.  ____________________________________________   LABS (all labs ordered are listed, but only abnormal results are displayed)  Labs Reviewed  CBC WITH DIFFERENTIAL/PLATELET - Abnormal; Notable for the following components:      Result Value   WBC 10.6 (*)    RBC 4.11 (*)    Hemoglobin 12.2 (*)    HCT 34.8 (*)    Neutro Abs 7.9 (*)    Eosinophils Absolute 0.9 (*)    All other  components within normal limits  BASIC METABOLIC PANEL - Abnormal; Notable for the following components:   Sodium 126 (*)    Chloride 91 (*)    Glucose, Bld 106 (*)    BUN 26 (*)    Calcium 8.7 (*)    All other components within normal limits  RESP PANEL BY RT-PCR (FLU A&B, COVID) ARPGX2  BRAIN NATRIURETIC PEPTIDE  BLOOD GAS, VENOUS  TROPONIN I (HIGH SENSITIVITY)   ____________________________________________  EKG  ED ECG REPORT I, Codee Bloodworth, FNP-BC personally viewed and interpreted this ECG.   Date: 09/26/2020  EKG Time: 1334  Rate: 75  Rhythm: normal EKG, normal sinus rhythm  Axis: normal  Intervals:none  ST&T Change: no ST elevation  ____________________________________________  RADIOLOGY  ED MD interpretation:    Right lower lobe pneumonia.  I, Sherrie George, personally viewed and evaluated these images (plain radiographs) as part of my medical decision making, as well as reviewing the written report by the radiologist.  Official radiology report(s): DG Chest 2 View  Result Date: 09/26/2020 CLINICAL DATA:  Shortness of breath EXAM: CHEST - 2 VIEW COMPARISON:  06/17/2020 FINDINGS: Heart is normal size. There is hyperinflation of the lungs compatible with COPD. Increased markings throughout the lungs likely reflects chronic interstitial lung disease. Superimposed increased opacity at the right lung base concerning for superimposed infiltrate. No effusions. Aortic atherosclerosis. No acute bony abnormality. IMPRESSION: COPD/chronic changes. Increased opacity at the right lung base concerning for superimposed infiltrate. Electronically Signed   By: Rolm Baptise M.D.   On: 09/26/2020 14:13    ____________________________________________   PROCEDURES  Procedure(s) performed (including Critical Care):  Procedures   CRITICAL CARE Performed by: Sherrie George   Total critical care time: 35 minutes  Critical care time was exclusive of separately billable  procedures and treating other patients.  Critical care was necessary to treat or prevent imminent or life-threatening deterioration.  Critical care was time spent personally by me on the following activities: development of treatment plan with patient and/or surrogate as well as nursing, discussions with consultants, evaluation of patient's response to treatment, examination of patient, obtaining history from patient or surrogate, ordering and performing treatments and interventions, ordering and review of laboratory studies, ordering and review of radiographic studies, pulse oximetry and re-evaluation of patient's condition.   ____________________________________________   INITIAL IMPRESSION /  ASSESSMENT AND PLAN   70 year old male presenting to the emergency department for treatment and evaluation of shortness of breath. See HPI. Standard Shortness of breath protocol started.  DIFFERENTIAL DIAGNOSIS  COPD exacerbation, acute respiratory failure, COVID-19, influenza, pneumonia, CHF  ED COURSE  Some improvement after a total of 3 DuoNeb's however still has diffuse wheezing with rhonchi in the right lower lung fields.  Chest x-ray does look like he has a right lower lobe pneumonia.  Plan will be to start antibiotics and admit. Oxygen saturation quickly drops below 90 when O2 is decreased to 4l currently on 6l.  Patient was noted of x-ray findings and plan for admission.  Patient is agreeable.  ----------------------------------------- 4:38 PM on 09/26/2020 -----------------------------------------  Troponin is normal. Hospitalist consult requested for admission.    ___________________________________________   FINAL CLINICAL IMPRESSION(S) / ED DIAGNOSES  Final diagnoses:  Acute on chronic respiratory failure with hypoxia (HCC)  Pneumonia of right lower lobe due to infectious organism     ED Discharge Orders    None       Jeremiah [name] was evaluated in Emergency  Department on 09/26/2020 for the symptoms described in the history of present illness. He was evaluated in the context of the global COVID-19 pandemic, which necessitated consideration that the patient might be at risk for infection with the SARS-CoV-2 virus that causes COVID-19. Institutional protocols and algorithms that pertain to the evaluation of patients at risk for COVID-19 are in a state of rapid change based on information released by regulatory bodies including the CDC and federal and state organizations. These policies and algorithms were followed during the patient's care in the ED.   Note:  This document was prepared using Dragon voice recognition software and may include unintentional dictation errors.   Victorino Dike, FNP 09/26/20 1650    Vanessa Kinross, MD 09/26/20 [phone]

## 2020-09-26 NOTE — Consult Note (Signed)
Pharmacy Antibiotic Note  Jeremiah [name] is a 70 y.o. male admitted on 09/26/2020 with acute COPD exacerbation and community acquired pneumonia. Pharmacy has been consulted for cefepime dosing.  Patient has a childhood allergy to penicillin reported as swelling. He has tolerated ceftriaxone in the past.  Plan: Cefepime 2 g IV q8h Monitor clinical picture and renal function F/U C&S, abx deescalation / LOT   Height: 6' (182.9 cm) Weight: 71.7 kg (158 lb) IBW/kg (Calculated) : 77.6  Temp (24hrs), Avg:97.8 F (36.6 C), Min:97.8 F (36.6 C), Max:97.8 F (36.6 C)  Recent Labs  Lab 09/26/20 1357  WBC 10.6*  CREATININE 1.01    Estimated Creatinine Clearance: 69 mL/min (by C-G formula based on SCr of 1.01 mg/dL).    Allergies  Allergen Reactions  . Penicillins Anaphylaxis, Swelling and Other (See Comments)    Tolerated Ceftriaxone 06/2020 05/2020 childhood reaction and his mother told him that he "swelled up."  Had a PCN reaction causing immediate rash, facial/tongue/throat swelling, SOB or lightheadedness with hypotension: Yes Had a PCN reaction causing severe rash involving mucus membranes or skin necrosis: No Had a PCN reaction that required hospitalization No Had a PCN reaction occurring within the last 10 years: No If all the above answers are "NO", may proceed with Cephalosporin use.    Antimicrobials this admission: 5/22 Cefepime >>  5/22 Levofloxacin x1  Dose adjustments this admission: n/a  Microbiology results: 5/22 Sputum: pending  Thank you for allowing pharmacy to be a part of this patient's care.  Darnelle Bos, PharmD 09/26/2020 5:17 PM

## 2020-09-26 NOTE — H&P (Signed)
History and Physical    Kaj Vasil GUY:[id number] DOB: 1950-10-12 DOA: 09/26/2020  PCP: System, Provider Not In  Patient coming from: home   Chief Complaint: dyspnea  HPI: Jeremiah [name] is a 70 y.o. male with medical history significant for chronic hypoxic respiratory failure on 2 L, severe copd, tobacco abuse, hyponatremia, who presents with the above.  Admitted 06/2020 with copd/pneumonia.  Reports several days worsening dyspnea with exertion. No chest pain or fevers but does not increased sputum production. Appetite normal, no n/v/d. No dysuria. No sick contacts. Reports compliance with home meds. Denies orthopnea.   ED Course:   Labs, imaging, solumedrol, levofloxacin, vancomycin  Review of Systems: As per HPI otherwise 10 point review of systems negative.    Past Medical History:  Diagnosis Date  . Asthma   . COPD (chronic obstructive pulmonary disease) (HCC)    O2 dependent - 2L  . GERD (gastroesophageal reflux disease)   . HTN (hypertension)   . Oxygen dependent   . Tobacco dependence     Past Surgical History:  Procedure Laterality Date  . CATARACT EXTRACTION W/PHACO Left 12/09/2019   Procedure: CATARACT EXTRACTION PHACO AND INTRAOCULAR LENS PLACEMENT (IOC) COMPLICATED LEFT 56.38 75:64.3;  Surgeon: Birder Robson, MD;  Location: [facility];  Service: Ophthalmology;  Laterality: Left;  [place]  . CATARACT EXTRACTION W/PHACO Right 01/27/2020   Procedure: CATARACT EXTRACTION PHACO AND INTRAOCULAR LENS PLACEMENT ([place]) RIGHT VISION BLUE 26.83 02:07.6;  Surgeon: Birder Robson, MD;  Location: [facility];  Service: Ophthalmology;  Laterality: Right;  . HERNIA REPAIR    . NECK SURGERY       reports that he has been smoking cigarettes. He has a 25.00 pack-year smoking history. He has never used smokeless tobacco. He reports current alcohol use. He reports that he does not use drugs.  Allergies  Allergen Reactions  .  Penicillins Anaphylaxis, Swelling and Other (See Comments)    Tolerated Ceftriaxone 06/2020 05/2020 childhood reaction and his mother told him that he "swelled up."  Had a PCN reaction causing immediate rash, facial/tongue/throat swelling, SOB or lightheadedness with hypotension: Yes Had a PCN reaction causing severe rash involving mucus membranes or skin necrosis: No Had a PCN reaction that required hospitalization No Had a PCN reaction occurring within the last 10 years: No If all the above answers are "NO", may proceed with Cephalosporin use.    Family History  Problem Relation Age of Onset  . Cancer - Colon Father   . Congestive Heart Failure Mother     Prior to Admission medications   Medication Sig Start Date End Date Taking? Authorizing Provider  acetaminophen (TYLENOL) 325 MG tablet Take 2 tablets (650 mg total) by mouth every 6 (six) hours as needed for mild pain (or Fever >/= 101). 08/06/15   Dustin Flock, MD  albuterol (VENTOLIN HFA) 108 (90 Base) MCG/ACT inhaler Inhale into the lungs every 6 (six) hours as needed for wheezing or shortness of breath.    [provider]  atorvastatin (LIPITOR) 40 MG tablet Take 40 mg by mouth daily.    [provider]  BREO ELLIPTA 100-25 MCG/INH AEPB Take 1 puff by mouth daily. 09/21/16   [provider]  diltiazem (CARDIZEM) 120 MG tablet Take 1 tablet (120 mg total) by mouth 4 (four) times daily. 08/06/15   Dustin Flock, MD  docusate sodium (COLACE) 100 MG capsule Take 100 mg by mouth daily as needed for mild constipation.    [provider]  furosemide (LASIX) 40 MG tablet Take 1 tablet (40 mg total) by mouth daily. 06/17/20 07/17/20  Sharen Hones, MD  Ipratropium-Albuterol (COMBIVENT RESPIMAT) 20-100 MCG/ACT AERS respimat Inhale 1 puff into the lungs every 4 (four) hours.    [provider]  ipratropium-albuterol (DUONEB) 0.5-2.5 (3) MG/3ML SOLN Take 3 mLs by nebulization every 4 (four)  hours. Patient not taking: Reported on 06/10/2020 08/06/15   Dustin Flock, MD  lisinopril (PRINIVIL,ZESTRIL) 10 MG tablet Take 10 mg by mouth daily.    [provider]  montelukast (SINGULAIR) 10 MG tablet Take 10 mg by mouth at bedtime.    [provider]  nicotine (NICODERM CQ - DOSED IN MG/24 HOURS) 21 mg/24hr patch Place 21 mg onto the skin daily.    [provider]  ondansetron (ZOFRAN-ODT) 4 MG disintegrating tablet Take 4 mg by mouth every 6 (six) hours as needed for nausea or vomiting.    [provider]  OXYGEN Inhale 2 L into the lungs at bedtime. And as needed    [provider]  pantoprazole (PROTONIX) 20 MG tablet Take 20 mg by mouth daily.    [provider]  sodium chloride 1 g tablet Take 1 tablet (1 g total) by mouth daily. 06/17/20   Sharen Hones, MD  tiotropium (SPIRIVA) 18 MCG inhalation capsule Place 18 mcg into inhaler and inhale daily.    [provider]    Physical Exam: Vitals:   09/26/20 1334 09/26/20 1337 09/26/20 1356 09/26/20 1547  BP: 101/60 101/60  103/67  Pulse: 74 74  66  Resp: 20 20  (!) 21  Temp:  97.8 F (36.6 C)    TempSrc:  Oral    SpO2: 91% (!) 87% 92% 93%  Weight:  71.7 kg    Height:  6' (1.829 m)      Constitutional: No acute distress, chronically ill appearing Head: Atraumatic Eyes: Conjunctiva clear ENM: Moist mucous membranes. Poor dentition.  Neck: Supple Respiratory: significant exp wheeze and rhonchi throughout Cardiovascular: Regular rate and rhythm. No murmur Abdomen: Non-tender, non-distended. No masses. No rebound or guarding. Positive bowel sounds. Musculoskeletal: No joint deformity upper and lower extremities. Normal ROM, no contractures. Normal muscle tone.  Skin: No rashes, lesions, or ulcers.  Extremities: trace LE edema. Palpable peripheral pulses. Neurologic: Alert, moving all 4 extremities. Psychiatric: Normal insight and judgement.   Labs on Admission: I  have personally reviewed following labs and imaging studies  CBC: Recent Labs  Lab 09/26/20 1357  WBC 10.6*  NEUTROABS 7.9*  HGB 12.2*  HCT 34.8*  MCV 84.7  PLT 482   Basic Metabolic Panel: Recent Labs  Lab 09/26/20 1357  NA 126*  K 4.4  CL 91*  CO2 25  GLUCOSE 106*  BUN 26*  CREATININE 1.01  CALCIUM 8.7*   GFR: Estimated Creatinine Clearance: 69 mL/min (by C-G formula based on SCr of 1.01 mg/dL). Liver Function Tests: No results for input(s): AST, ALT, ALKPHOS, BILITOT, PROT, ALBUMIN in the last 168 hours. No results for input(s): LIPASE, AMYLASE in the last 168 hours. No results for input(s): AMMONIA in the last 168 hours. Coagulation Profile: No results for input(s): INR, PROTIME in the last 168 hours. Cardiac Enzymes: No results for input(s): CKTOTAL, CKMB, CKMBINDEX, TROPONINI in the last 168 hours. BNP (last 3 results) No results for input(s): PROBNP in the last 8760 hours. HbA1C: No results for input(s): HGBA1C in the last 72 hours. CBG: No results for input(s): GLUCAP in  the last 168 hours. Lipid Profile: No results for input(s): CHOL, HDL, LDLCALC, TRIG, CHOLHDL, LDLDIRECT in the last 72 hours. Thyroid Function Tests: No results for input(s): TSH, T4TOTAL, FREET4, T3FREE, THYROIDAB in the last 72 hours. Anemia Panel: No results for input(s): VITAMINB12, FOLATE, FERRITIN, TIBC, IRON, RETICCTPCT in the last 72 hours. Urine analysis:    Component Value Date/Time   COLORURINE YELLOW (A) 06/10/2020 0220   APPEARANCEUR CLEAR (A) 06/10/2020 0220   APPEARANCEUR Clear 09/12/2013 0920   LABSPEC 1.012 06/10/2020 0220   LABSPEC 1.010 09/12/2013 0920   PHURINE 5.0 06/10/2020 0220   GLUCOSEU NEGATIVE 06/10/2020 0220   GLUCOSEU Negative 09/12/2013 0920   HGBUR NEGATIVE 06/10/2020 0220   BILIRUBINUR NEGATIVE 06/10/2020 0220   BILIRUBINUR Negative 09/12/2013 0920   KETONESUR NEGATIVE 06/10/2020 0220   PROTEINUR NEGATIVE 06/10/2020 0220   NITRITE NEGATIVE  06/10/2020 0220   LEUKOCYTESUR NEGATIVE 06/10/2020 0220   LEUKOCYTESUR Negative 09/12/2013 0920    Radiological Exams on Admission: DG Chest 2 View  Result Date: 09/26/2020 CLINICAL DATA:  Shortness of breath EXAM: CHEST - 2 VIEW COMPARISON:  06/17/2020 FINDINGS: Heart is normal size. There is hyperinflation of the lungs compatible with COPD. Increased markings throughout the lungs likely reflects chronic interstitial lung disease. Superimposed increased opacity at the right lung base concerning for superimposed infiltrate. No effusions. Aortic atherosclerosis. No acute bony abnormality. IMPRESSION: COPD/chronic changes. Increased opacity at the right lung base concerning for superimposed infiltrate. Electronically Signed   By: Rolm Baptise M.D.   On: 09/26/2020 14:13    EKG: Independently reviewed. Nsr, qtc 416  Assessment/Plan Principal Problem:   COPD with acute exacerbation (HCC) Active Problems:   Acute on chronic respiratory failure with hypoxia (HCC)   Tobacco dependency   Hyponatremia   # COPD with acute exacerbation # Community acquired pneumonia # Acute on chronic respiratory failure Severe copd at baseline, baseline 2 L, continues to smoke 1.5 PPD. Here with several days dyspnea and productive cough. CXR with possible RLL infiltrate. O2 mid-70s at home on arrival by EMS, improved to wnl with 6 L O2. No sig respiratory distress at time of my eval, VGB in the ED wnl. Treated for CAP 06/2020. - will broaden to cefepime given severe copd, evidence of CAP, and recent broad-spectrum abx - f/u sputum culture and urine antigens for legionella and strep - prednisone - duonebs scheduled, albuterol prn - cont home breo, singulair - O2, wean as able  # Tobacco abuse - nicotine patch ordered  # Hyponatremia Previously thought to be siadh. Is on lasix, but recent TTE w/o signs of CHF and bnp very wnl. tsh wnl earlier this year. Chronic, is at baseline. - hold lasix - f/u urine/serum  osm, urine sodium  # HTN Here bp low normal - hold home dilt and lisinopril and lasix - cont home atorva  DVT prophylaxis: lovenox Code Status: full  Family Communication: none @ bedside  Consults called: none   Level of care: Med-Surg Status is: Inpatient  Remains inpatient appropriate because:Inpatient level of care appropriate due to severity of illness   Dispo: The patient is from: Home              Anticipated d/c is to: Home              Patient currently is not medically stable to d/c.   Difficult to place patient No        Desma Maxim MD [facility] Pager [phone]  If  7PM-7AM, please contact night-coverage [email or web address] Password [facility]  09/26/2020, 5:07 PM

## 2020-09-26 NOTE — Progress Notes (Signed)
PHARMACY -  BRIEF ANTIBIOTIC NOTE   Pharmacy has received consult(s) for Vancomcyin from an ED provider.  The patient's profile has been reviewed for ht/wt/allergies/indication/available labs.    One time order(s) placed for Vancomycin 1500mg   Further antibiotics/pharmacy consults should be ordered by admitting physician if indicated.                       Thank you, Pernell Dupre, PharmD, BCPS Clinical Pharmacist 09/26/2020 3:37 PM

## 2020-09-27 DIAGNOSIS — E871 Hypo-osmolality and hyponatremia: Secondary | ICD-10-CM

## 2020-09-27 DIAGNOSIS — J441 Chronic obstructive pulmonary disease with (acute) exacerbation: Secondary | ICD-10-CM | POA: Diagnosis not present

## 2020-09-27 DIAGNOSIS — J9621 Acute and chronic respiratory failure with hypoxia: Secondary | ICD-10-CM

## 2020-09-27 LAB — COMPREHENSIVE METABOLIC PANEL
ALT: 18 U/L (ref 0–44)
AST: 24 U/L (ref 15–41)
Albumin: 3.4 g/dL — ABNORMAL LOW (ref 3.5–5.0)
Alkaline Phosphatase: 103 U/L (ref 38–126)
Anion gap: 9 (ref 5–15)
BUN: 25 mg/dL — ABNORMAL HIGH (ref 8–23)
CO2: 25 mmol/L (ref 22–32)
Calcium: 8.6 mg/dL — ABNORMAL LOW (ref 8.9–10.3)
Chloride: 91 mmol/L — ABNORMAL LOW (ref 98–111)
Creatinine, Ser: 0.72 mg/dL (ref 0.61–1.24)
GFR, Estimated: >60 mL/min (ref >60)
Glucose, Bld: 175 mg/dL — ABNORMAL HIGH (ref 70–99)
Potassium: 4.7 mmol/L (ref 3.5–5.1)
Sodium: 125 mmol/L — ABNORMAL LOW (ref 135–145)
Total Bilirubin: 0.4 mg/dL (ref 0.3–1.2)
Total Protein: 7 g/dL (ref 6.5–8.1)

## 2020-09-27 LAB — CBC
HCT: 34.6 % — ABNORMAL LOW (ref 39.0–52.0)
Hemoglobin: 11.9 g/dL — ABNORMAL LOW (ref 13.0–17.0)
MCH: 29.2 pg (ref 26.0–34.0)
MCHC: 34.4 g/dL (ref 30.0–36.0)
MCV: 84.8 fL (ref 80.0–100.0)
Platelets: 321 10*3/uL (ref 150–400)
RBC: 4.08 MIL/uL — ABNORMAL LOW (ref 4.22–5.81)
RDW: 14.3 % (ref 11.5–15.5)
WBC: 5.6 10*3/uL (ref 4.0–10.5)
nRBC: 0 % (ref 0.0–0.2)

## 2020-09-27 LAB — OSMOLALITY, URINE: Osmolality, Ur: 309 mOsm/kg (ref 300–900)

## 2020-09-27 LAB — OSMOLALITY: Osmolality: 273 mOsm/kg — ABNORMAL LOW (ref 275–295)

## 2020-09-27 LAB — PROCALCITONIN: Procalcitonin: <0.1 ng/mL

## 2020-09-27 LAB — STREP PNEUMONIAE URINARY ANTIGEN: Strep Pneumo Urinary Antigen: NEGATIVE

## 2020-09-27 LAB — SODIUM, URINE, RANDOM: Sodium, Ur: 31 mmol/L

## 2020-09-27 MED ORDER — ACETAMINOPHEN 325 MG PO TABS
650.0000 mg | ORAL_TABLET | Freq: Four times a day (QID) | ORAL | Status: DC | PRN
  Administered 2020-09-27 – 2020-09-29 (×2): 650 mg via ORAL
  Filled 2020-09-27 (×2): qty 2

## 2020-09-27 MED ORDER — DILTIAZEM HCL 30 MG PO TABS
120.0000 mg | ORAL_TABLET | Freq: Four times a day (QID) | ORAL | Status: DC
  Administered 2020-09-27: 120 mg via ORAL
  Filled 2020-09-27 (×2): qty 4

## 2020-09-27 MED ORDER — DILTIAZEM HCL 30 MG PO TABS
120.0000 mg | ORAL_TABLET | Freq: Four times a day (QID) | ORAL | Status: DC
  Administered 2020-09-28 – 2020-09-29 (×6): 120 mg via ORAL
  Filled 2020-09-27 (×5): qty 4

## 2020-09-27 MED ORDER — SODIUM CHLORIDE 1 G PO TABS
1.0000 g | ORAL_TABLET | Freq: Two times a day (BID) | ORAL | Status: DC
  Administered 2020-09-27 – 2020-09-30 (×6): 1 g via ORAL
  Filled 2020-09-27 (×6): qty 1

## 2020-09-27 NOTE — Progress Notes (Addendum)
PROGRESS NOTE    Jeremiah [name]  AYT:[id number] DOB: [birth year] DOA: 09/26/2020 PCP: System, Provider Not In   Chief complaint.  Shortness of breath. Brief Narrative:  Jeremiah [name] is a 70 y.o. male with medical history significant for chronic hypoxic respiratory failure on 2 L, severe copd, tobacco abuse, hyponatremia, who presents with shortness of breath for about a week time. Upon arriving the hospital, he was hypoxic, he was placed on 4 L oxygen, later increased to 6 L.  Sodium level 126, chest x-ray showed right lower lobe infiltrates.  Procalcitonin level less than 0.1, BNP 20.  Patient with a placed on IV steroids, then changed to oral.  Patient was also on cefepime for possible pneumonia.  Patient does has history of dysphagia, he also has acid reflex, he cough worse in the morning.  Assessment & Plan:   Principal Problem:   COPD with acute exacerbation ([place]) Active Problems:   Acute on chronic respiratory failure with hypoxia (HCC)   Tobacco dependency   Hyponatremia  #1.  Acute on chronic hypoxemic respiratory failure. COPD exacerbation. Right lower lobe pneumonia, most likely aspiration pneumonia. Patient condition seem to be better this morning, he is back on 4 L oxygen.  Still has short of breath with minimal exertion.  Does not have paroxysmal nocturnal dyspnea, BNP normal, no volume overload. Has right lower lobe infiltrates, I have personally reviewed the image, raise a possibility of aspiration pneumonia.  Procalcitonin level was normal.  I will obtain speech therapy to rule out aspiration.  Continue cefepime for now, patient has penicillin allergy. Patient condition seem to be improving, he may be able to go home tomorrow. Continue prednisone for COPD exacerbation.  #2.  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  He appears to be euvolemic.  At this time, he  will be placed on fluid restriction, also started salt tablets 1 g twice a day. Discontinued HCTZ Reviewed previous labs, baseline sodium level was 129.  #3  essential hypertension. Resume diltiazem.    DVT prophylaxis: Lovenox Code Status: full Family Communication:  Disposition Plan:  .   Status is: Inpatient  Remains inpatient appropriate because:Inpatient level of care appropriate due to severity of illness   Dispo: The patient is from: Home              Anticipated d/c is to: Home              Patient currently is not medically stable to d/c.   Difficult to place patient No        I/O last 3 completed shifts: In: 840 [P.O.:720; I.V.:20; IV Piggyback:100] Out: 1600 [Urine:1600] Total I/O In: 360 [P.O.:360] Out: -      Consultants:   None  Procedures: None  Antimicrobials: Cefepime  Subjective: Patient still on 4 L oxygen this morning, feels short of breath better. Cough, nonproductive. Patient had a history of dysphagia, he has acid reflex. No fever or chills No abdominal pain or nausea vomiting. No dysuria hematuria  No chest pain or palpitation.  Objective: Vitals:   09/27/20 0114 09/27/20 0413 09/27/20 0729 09/27/20 1125  BP:  107/63 106/69 110/65  Pulse:  92 96 98  Resp:  20 19 18   Temp:  97.8 F (36.6 C) 97.8 F (36.6 C) 97.9 F (36.6 C)  TempSrc:  Oral  Oral  SpO2: 90% 94% 91% 90%  Weight:  Height:        Intake/Output Summary (Last 24 hours) at 09/27/2020 1309 Last data filed at 09/27/2020 1033 Gross per 24 hour  Intake 1200 ml  Output 1600 ml  Net -400 ml   Filed Weights   09/26/20 1337 09/26/20 2025  Weight: 71.7 kg 75.3 kg    Examination:  General exam: Appears calm and comfortable  Respiratory system: Decreased breathing sounds bilaterally. Respiratory effort normal. Cardiovascular system: S1 & S2 heard, RRR. No JVD, murmurs, rubs, gallops or clicks. No pedal edema. Gastrointestinal system: Abdomen is  nondistended, soft and nontender. No organomegaly or masses felt. Normal bowel sounds heard. Central nervous system: Alert and oriented. No focal neurological deficits. Extremities: Symmetric 5 x 5 power. Skin: No rashes, lesions or ulcers Psychiatry: Judgement and insight appear normal. Mood & affect appropriate.     Data Reviewed: I have personally reviewed following labs and imaging studies  CBC: Recent Labs  Lab 09/26/20 1357 09/27/20 0512  WBC 10.6* 5.6  NEUTROABS 7.9*  --   HGB 12.2* 11.9*  HCT 34.8* 34.6*  MCV 84.7 84.8  PLT 329 650   Basic Metabolic Panel: Recent Labs  Lab 09/26/20 1357 09/27/20 0512  NA 126* 125*  K 4.4 4.7  CL 91* 91*  CO2 25 25  GLUCOSE 106* 175*  BUN 26* 25*  CREATININE 1.01 0.72  CALCIUM 8.7* 8.6*   GFR: Estimated Creatinine Clearance: 91.5 mL/min (by C-G formula based on SCr of 0.72 mg/dL). Liver Function Tests: Recent Labs  Lab 09/27/20 0512  AST 24  ALT 18  ALKPHOS 103  BILITOT 0.4  PROT 7.0  ALBUMIN 3.4*   No results for input(s): LIPASE, AMYLASE in the last 168 hours. No results for input(s): AMMONIA in the last 168 hours. Coagulation Profile: No results for input(s): INR, PROTIME in the last 168 hours. Cardiac Enzymes: No results for input(s): CKTOTAL, CKMB, CKMBINDEX, TROPONINI in the last 168 hours. BNP (last 3 results) No results for input(s): PROBNP in the last 8760 hours. HbA1C: No results for input(s): HGBA1C in the last 72 hours. CBG: No results for input(s): GLUCAP in the last 168 hours. Lipid Profile: No results for input(s): CHOL, HDL, LDLCALC, TRIG, CHOLHDL, LDLDIRECT in the last 72 hours. Thyroid Function Tests: No results for input(s): TSH, T4TOTAL, FREET4, T3FREE, THYROIDAB in the last 72 hours. Anemia Panel: No results for input(s): VITAMINB12, FOLATE, FERRITIN, TIBC, IRON, RETICCTPCT in the last 72 hours. Sepsis Labs: Recent Labs  Lab 09/27/20 0512  PROCALCITON <0.10    Recent Results (from  the past 240 hour(s))  Resp Panel by RT-PCR (Flu A&B, Covid) Nasopharyngeal Swab     Status: None   Collection Time: 09/26/20  1:57 PM   Specimen: Nasopharyngeal Swab; Nasopharyngeal(NP) swabs in vial transport medium  Result Value Ref Range Status   SARS Coronavirus 2 by RT PCR NEGATIVE NEGATIVE Final    Comment: (NOTE) SARS-CoV-2 target nucleic acids are NOT DETECTED.  The SARS-CoV-2 RNA is generally detectable in upper respiratory specimens during the acute phase of infection. The lowest concentration of SARS-CoV-2 viral copies this assay can detect is 138 copies/mL. A negative result does not preclude SARS-Cov-2 infection and should not be used as the sole basis for treatment or other patient management decisions. A negative result may occur with  improper specimen collection/handling, submission of specimen other than nasopharyngeal swab, presence of viral mutation(s) within the areas targeted by this assay, and inadequate number of viral copies(<138 copies/mL). A negative result must  be combined with clinical observations, patient history, and epidemiological information. The expected result is Negative.  Fact Sheet for Patients:  EntrepreneurPulse.com.au  Fact Sheet for Healthcare Providers:  IncredibleEmployment.be  This test is no t yet approved or cleared by the [place] FDA and  has been authorized for detection and/or diagnosis of SARS-CoV-2 by FDA under an Emergency Use Authorization (EUA). This EUA will remain  in effect (meaning this test can be used) for the duration of the COVID-19 declaration under Section 564(b)(1) of the Act, 21 U.S.C.section 360bbb-3(b)(1), unless the authorization is terminated  or revoked sooner.       Influenza A by PCR NEGATIVE NEGATIVE Final   Influenza B by PCR NEGATIVE NEGATIVE Final    Comment: (NOTE) The Xpert Xpress SARS-CoV-2/FLU/RSV plus assay is intended as an aid in the diagnosis of  influenza from Nasopharyngeal swab specimens and should not be used as a sole basis for treatment. Nasal washings and aspirates are unacceptable for Xpert Xpress SARS-CoV-2/FLU/RSV testing.  Fact Sheet for Patients: EntrepreneurPulse.com.au  Fact Sheet for Healthcare Providers: IncredibleEmployment.be  This test is not yet approved or cleared by the [place] FDA and has been authorized for detection and/or diagnosis of SARS-CoV-2 by FDA under an Emergency Use Authorization (EUA). This EUA will remain in effect (meaning this test can be used) for the duration of the COVID-19 declaration under Section 564(b)(1) of the Act, 21 U.S.C. section 360bbb-3(b)(1), unless the authorization is terminated or revoked.  Performed at [facility], [address] [place], [place] [address]          Radiology Studies: DG Chest 2 View  Result Date: 09/26/2020 CLINICAL DATA:  Shortness of breath EXAM: CHEST - 2 VIEW COMPARISON:  06/17/2020 FINDINGS: Heart is normal size. There is hyperinflation of the lungs compatible with COPD. Increased markings throughout the lungs likely reflects chronic interstitial lung disease. Superimposed increased opacity at the right lung base concerning for superimposed infiltrate. No effusions. Aortic atherosclerosis. No acute bony abnormality. IMPRESSION: COPD/chronic changes. Increased opacity at the right lung base concerning for superimposed infiltrate. Electronically Signed   By: Rolm Baptise M.D.   On: 09/26/2020 14:13        Scheduled Meds: . atorvastatin  40 mg Oral Daily  . enoxaparin (LOVENOX) injection  40 mg Subcutaneous Q24H  . fluticasone furoate-vilanterol  1 puff Inhalation Daily  . ipratropium-albuterol  3 mL Nebulization Q6H  . montelukast  10 mg Oral QHS  . nicotine  21 mg Transdermal Daily  . pantoprazole  20 mg Oral Daily  . predniSONE  40 mg Oral Q breakfast  . sodium chloride flush  3 mL  Intravenous Q12H   Continuous Infusions: . sodium chloride Stopped (09/27/20 1130)  . ceFEPime (MAXIPIME) IV 2 g (09/27/20 0843)     LOS: 1 day    Time spent: 32 minutes    Sharen Hones, MD [facility]   To contact the attending provider between 7A-7P or the covering provider during after hours 7P-7A, please log into the web site [email or web address] and access using universal [place] password for that web site. If you do not have the password, please call the hospital operator.  09/27/2020, 1:09 PM

## 2020-09-27 NOTE — TOC Progression Note (Addendum)
Transition of Care [facility]) - Progression Note    Patient Details  Name: Jeremiah [name] MRN: [id number] Date of Birth: [birth year]  Transition of Care [facility]) CM/SW [place], RN Phone Number: 09/27/2020, 11:19 AM  Clinical Narrative:   TOC in to see patient at bedside to discuss discharge planning.  Patient lives at home with friends, states he has no concerns with getting to appointments or getting prescriptions.  No other concerns about discharging home at this time  Patient states his PCP is [facility].  TOC contact information given, TOC will follow through discharge.   Addendum 11:22 am:  Patient has oxygen already set up at home, reports no concerns.    Expected Discharge Plan and Services                                                 Social Determinants of Health (SDOH) Interventions    Readmission Risk Interventions Readmission Risk Prevention Plan 09/27/2020 06/17/2020  Transportation Screening Complete Complete  PCP or Specialist Appt within 3-5 Days Complete Complete  HRI or Home Care Consult Complete Complete  Social Work Consult for [place] Planning/Counseling Not Complete Complete  SW consult not completed comments RNCM assigned to patient -  Palliative Care Screening Not Applicable Not Applicable  Medication Review Press photographer) Complete Complete  Some recent data might be hidden

## 2020-09-28 DIAGNOSIS — E871 Hypo-osmolality and hyponatremia: Secondary | ICD-10-CM | POA: Diagnosis not present

## 2020-09-28 DIAGNOSIS — J9621 Acute and chronic respiratory failure with hypoxia: Secondary | ICD-10-CM | POA: Diagnosis not present

## 2020-09-28 DIAGNOSIS — J441 Chronic obstructive pulmonary disease with (acute) exacerbation: Secondary | ICD-10-CM | POA: Diagnosis not present

## 2020-09-28 LAB — BASIC METABOLIC PANEL
Anion gap: 9 (ref 5–15)
BUN: 25 mg/dL — ABNORMAL HIGH (ref 8–23)
CO2: 25 mmol/L (ref 22–32)
Calcium: 8.3 mg/dL — ABNORMAL LOW (ref 8.9–10.3)
Chloride: 94 mmol/L — ABNORMAL LOW (ref 98–111)
Creatinine, Ser: 0.62 mg/dL (ref 0.61–1.24)
GFR, Estimated: >60 mL/min (ref >60)
Glucose, Bld: 142 mg/dL — ABNORMAL HIGH (ref 70–99)
Potassium: 4.1 mmol/L (ref 3.5–5.1)
Sodium: 128 mmol/L — ABNORMAL LOW (ref 135–145)

## 2020-09-28 LAB — LEGIONELLA PNEUMOPHILA SEROGP 1 UR AG: L. pneumophila Serogp 1 Ur Ag: NEGATIVE

## 2020-09-28 LAB — CBC WITH DIFFERENTIAL/PLATELET
Abs Immature Granulocytes: 0.09 10*3/uL — ABNORMAL HIGH (ref 0.00–0.07)
Basophils Absolute: 0 10*3/uL (ref 0.0–0.1)
Basophils Relative: 0 %
Eosinophils Absolute: 0 10*3/uL (ref 0.0–0.5)
Eosinophils Relative: 0 %
HCT: 33.8 % — ABNORMAL LOW (ref 39.0–52.0)
Hemoglobin: 11.6 g/dL — ABNORMAL LOW (ref 13.0–17.0)
Immature Granulocytes: 1 %
Lymphocytes Relative: 7 %
Lymphs Abs: 1.2 10*3/uL (ref 0.7–4.0)
MCH: 29.1 pg (ref 26.0–34.0)
MCHC: 34.3 g/dL (ref 30.0–36.0)
MCV: 84.7 fL (ref 80.0–100.0)
Monocytes Absolute: 1.2 10*3/uL — ABNORMAL HIGH (ref 0.1–1.0)
Monocytes Relative: 8 %
Neutro Abs: 13.6 10*3/uL — ABNORMAL HIGH (ref 1.7–7.7)
Neutrophils Relative %: 84 %
Platelets: 331 10*3/uL (ref 150–400)
RBC: 3.99 MIL/uL — ABNORMAL LOW (ref 4.22–5.81)
RDW: 14.5 % (ref 11.5–15.5)
WBC: 16.1 10*3/uL — ABNORMAL HIGH (ref 4.0–10.5)
nRBC: 0 % (ref 0.0–0.2)

## 2020-09-28 LAB — MAGNESIUM: Magnesium: 2 mg/dL (ref 1.7–2.4)

## 2020-09-28 MED ORDER — HYDROCOD POLST-CPM POLST ER 10-8 MG/5ML PO SUER
5.0000 mL | Freq: Two times a day (BID) | ORAL | Status: DC
Start: 2020-09-28 — End: 2020-09-30
  Administered 2020-09-28 – 2020-09-30 (×4): 5 mL via ORAL
  Filled 2020-09-28 (×4): qty 5

## 2020-09-28 MED ORDER — ALUM & MAG HYDROXIDE-SIMETH 200-200-20 MG/5ML PO SUSP
30.0000 mL | ORAL | Status: DC | PRN
  Administered 2020-09-28: 30 mL via ORAL
  Filled 2020-09-28: qty 30

## 2020-09-28 MED ORDER — FAMOTIDINE 20 MG PO TABS
20.0000 mg | ORAL_TABLET | Freq: Two times a day (BID) | ORAL | Status: DC
  Administered 2020-09-28 – 2020-09-30 (×4): 20 mg via ORAL
  Filled 2020-09-28 (×4): qty 1

## 2020-09-28 MED ORDER — PANTOPRAZOLE SODIUM 40 MG PO TBEC
40.0000 mg | DELAYED_RELEASE_TABLET | Freq: Every day | ORAL | Status: DC
  Administered 2020-09-29 – 2020-09-30 (×2): 40 mg via ORAL
  Filled 2020-09-28 (×2): qty 1

## 2020-09-28 NOTE — Evaluation (Addendum)
Clinical/Bedside Swallow Evaluation Patient Details  Name: Jeremiah [name] MRN: [id number] Date of Birth: 12-Sep-1950  Today's Date: 09/28/2020 Time: SLP Start Time (ACUTE ONLY): 83 SLP Stop Time (ACUTE ONLY): 1015 SLP Time Calculation (min) (ACUTE ONLY): 55 min  Past Medical History:  Past Medical History:  Diagnosis Date  . Asthma   . COPD (chronic obstructive pulmonary disease) (HCC)    O2 dependent - 2L  . GERD (gastroesophageal reflux disease)   . HTN (hypertension)   . Oxygen dependent   . Tobacco dependence    Past Surgical History:  Past Surgical History:  Procedure Laterality Date  . CATARACT EXTRACTION W/PHACO Left 12/09/2019   Procedure: CATARACT EXTRACTION PHACO AND INTRAOCULAR LENS PLACEMENT (IOC) COMPLICATED LEFT 54.62 70:35.0;  Surgeon: Birder Robson, MD;  Location: [place];  Service: Ophthalmology;  Laterality: Left;  [place]  . CATARACT EXTRACTION W/PHACO Right 01/27/2020   Procedure: CATARACT EXTRACTION PHACO AND INTRAOCULAR LENS PLACEMENT ([place]) RIGHT VISION BLUE 26.83 02:07.6;  Surgeon: Birder Robson, MD;  Location: [facility] [place];  Service: Ophthalmology;  Laterality: Right;  . HERNIA REPAIR    . NECK SURGERY     HPI:  Pt is a 70 y.o. male with medical history significant for chronic hypoxic respiratory failure on 2 L, Severe copd, tobacco abuse, hyponatremia, who presents with shortness of breath for about a week time.  Upon arriving the hospital, he was hypoxic, he was placed on 4 L oxygen, later increased to 6 L.  Sodium level 126, chest x-ray showed right lower lobe infiltrates.  Procalcitonin level less than 0.1, BNP 20.  Patient with a placed on IV steroids, then changed to oral.  Patient was also on cefepime for possible pneumonia.  Patient has history of Reflux/dysphagia per report(no EGD per chart), he has a cough worse in the morning.  CXR: "COPD. Increased markings throughout the lungs likely  reflects chronic  interstitial lung disease. Superimposed increased  opacity at the right lung base concerning for superimposed  infiltrate. No effusions. Aortic atherosclerosis.".   Assessment / Plan / Recommendation Clinical Impression  Pt appears to present w/ grossly adequate oropharyngeal phase swallowing function w/ No overt oropharyngeal phase dysphagia appreciated w/ trials accepted; no neuromuscular swallowing deficits appreciated. Pt is Edentulous at Baseline, does not wear Dentures. Increased oral phase time needed for full gumming/mashing of solid trials for swallowing/clearing. Pt appears at reduced risk for aspiration from an oropharyngeal phase standpoint following general aspiration precautions, moistening solids. Pt has a baseline of GERD, on a PPI of 20mg  in the morning. Unsure of consistency of use of PPI. ANY Dysmotility or Regurgitation of Reflux material can increase risk for aspiration of the Reflux material during Retrograde backflow thus impact Voicing and Pulmonary status. Pt described ongoing issues w/ Phelgm in the mornings and mild discomfort during "heavy" meals/foods pointing to mid-lower sternum area of his chest. He denied any significant changes in his eating -- likes to eat "hamburgers". Pt sat upright in bed w/ cues/support (deep, thick, congested coughing noted during movement) and consumed trials of thin liquids via Cup/Straw then tsp of puree and soft solids w/ no immediate, overt clinical s/s of aspiration noted; clear vocal quality b/t trials, no decline in pulmonary status, no multiple swallows noted post initial pharyngeal swallow. Oral phase appeared grossly [facility] for bolus management and timely A-P transfer/clearing of material. Increased oral phase Time needed for gumming/mahsing of solid food trials d/t Edentulous status -- moistened trials for ease of breakdown. OM  exam was [facility] (1-Rh) for lingual/labial strength and ROM movements. No unilateral weakness. Speech clear.  Of Note, Belching  occurred x2 during oral intake. Re: phlegm, pt stated need to hock/spit phlegm in the AM most often.   Recommend continue Regluar diet (pt's choice immediately) for ease of choice of manageable foods as tolerates(w/ less meats/breads in diet if problematic, moisten all foods well) w/ thin liquids via Cup/less straw use d/t air swallowing; general aspiration precautions. Rest Breaks during meals/oral intake to allow for Esophageal clearing. REFLUX precautions strongly recommended to lessen chance for Regurgitation. MD to address current dose of PPI w/ increase to 40mg  if appropriate for pt. An Incentive Spirometer was given to pt w/ encouragement to get out of bed for meals and to go for a walk often during the day w/ NSG vs lying in bed.  Recommend pt f/u w/ GI for management of Reflux/GERD. F/u w/ ENT for direct viewing of pharynx and airway entrance could be considered d/t pt's c/o discomfort w/ breathing and O2 use; chronic Reflux irritation such as edema and s/s of LPR could be present -- if any concern for Silent laryngeal penetration or aspiration d/t decreased sensation from LPR, then MD to order MBSS to objectively assess swallow function. Disucssion and handouts given on Reflux. MD to reconsult ST services if any new needs while admitted. MD/NSG/CM updated. SLP Visit Diagnosis: Dysphagia, unspecified (R13.10) (Edentulous; Reflux)    Aspiration Risk   (reduced following general precautions)    Diet Recommendation  Regluar diet w/ foods Cut Small (pt's choice immediately) for ease of choice of manageable foods as tolerates(w/ less meats/breads in diet if problematic, moisten all foods well) w/ Thin liquids via Cup/less straw use d/t air swallowing; general aspiration precautions. Rest Breaks during meals/oral intake to allow for Esophageal clearing. REFLUX precautions strongly recommended to lessen chance for Regurgitation.   Medication Administration: Whole meds with liquid (but Whole in puree if  needed for ease)    Other  Recommendations Recommended Consults: Consider GI evaluation;Consider esophageal assessment (Dietician f/u) Oral Care Recommendations: Oral care BID;Patient independent with oral care Other Recommendations:  (n/a)   Follow up Recommendations None (at this time)      Frequency and Duration  (n/a)   (n/a)       Prognosis Prognosis for Safe Diet Advancement: Fair (-Good) Barriers to Reach Goals: Severity of deficits;Time post onset;Behavior;Motivation      Swallow Study   General Date of Onset: 09/27/20 HPI: Pt is a 70 y.o. male with medical history significant for chronic hypoxic respiratory failure on 2 L, Severe copd, tobacco abuse, hyponatremia, who presents with shortness of breath for about a week time.  Upon arriving the hospital, he was hypoxic, he was placed on 4 L oxygen, later increased to 6 L.  Sodium level 126, chest x-ray showed right lower lobe infiltrates.  Procalcitonin level less than 0.1, BNP 20.  Patient with a placed on IV steroids, then changed to oral.  Patient was also on cefepime for possible pneumonia.  Patient has history of Reflux/dysphagia, he has a cough worse in the morning.  CXR: "COPD. Increased markings throughout the lungs likely  reflects chronic interstitial lung disease. Superimposed increased  opacity at the right lung base concerning for superimposed  infiltrate. No effusions. Aortic atherosclerosis.". Type of Study: Bedside Swallow Evaluation Previous Swallow Assessment: none Diet Prior to this Study: Regular;Thin liquids Temperature Spikes Noted: No (wbc elevated) Respiratory Status: Nasal cannula (3-4L chronic) History of Recent Intubation: No  Behavior/Cognition: Alert;Cooperative;Pleasant mood;Distractible;Requires cueing (min) Oral Cavity Assessment: Within Functional Limits Oral Care Completed by SLP: Recent completion by staff Oral Cavity - Dentition: Edentulous (baseline) Vision: Functional for  self-feeding Self-Feeding Abilities: Able to feed self;Needs set up Patient Positioning: Upright in bed (supported Upright positioning) Baseline Vocal Quality: Normal (min gravely) Volitional Cough: Strong Volitional Swallow: Able to elicit    Oral/Motor/Sensory Function Overall Oral Motor/Sensory Function: Within functional limits   Ice Chips Ice chips: Within functional limits Presentation: Spoon (fed; 2 trials)   Thin Liquid Thin Liquid: Within functional limits Presentation: Cup;Self Fed;Straw (4-5 trials via each) Other Comments: delayed cough moving around in bed post swallowing x1 -- did not occur again.    Nectar Thick Nectar Thick Liquid: Not tested   Honey Thick Honey Thick Liquid: Not tested   Puree Puree: Within functional limits Presentation: Self Fed;Spoon (5-6 trials)   Solid     Solid: Impaired (min -- Edentulous baseline) Presentation: Self Fed;Spoon (4 trials) Oral Phase Impairments: Impaired mastication (Edentulous) Pharyngeal Phase Impairments:  (none) Other Comments: moistened the food        Orinda Kenner, MS, [employer] Speech Language Pathologist Rehab Services [phone] Yer Castello 09/28/2020,2:31 PM

## 2020-09-28 NOTE — TOC Progression Note (Signed)
Transition of Care [facility]) - Progression Note    Patient Details  Name: Jeremiah [name] MRN: [id number] Date of Birth: [birth year]  Transition of [facility]) CM/SW [place], RN Phone Number: 09/28/2020, 10:05 AM  Clinical Narrative:   [facility] office received a call regarding this patient from a person that would not leave name or phone number. The caller knew patient's room number.   Caller stated the following:  -patient was originally from a homeless shelter but recently moved in with a friend.  -When patient's check comes in, he cashes it out and gives the friend the entire amount in exchange for a place to live.  -The friend will not allow the air conditioner to run although patient has difficulty breathing at times.   -The friend will not allow him to go to [employer] to buy clothes for an MD appointment.   -The home needed to be sprayed for bed bugs and roaches. -The patient may have nowhere to go if he does not return there.  TOC at bedside to speak with patient about discharge planning.  Patient was alert and oriented x 4.  Patient says he can return and does not want to be questioned because he feels his living situation is personal and refused to elaborate further on the situation.          Expected Discharge Plan and Services                                                 Social Determinants of Health (SDOH) Interventions    Readmission Risk Interventions Readmission Risk Prevention Plan 09/27/2020 06/17/2020  Transportation Screening Complete Complete  PCP or Specialist Appt within 3-5 Days Complete Complete  HRI or Home Care Consult Complete Complete  Social Work Consult for [place] Planning/Counseling Not Complete Complete  SW consult not completed comments RNCM assigned to patient -  Palliative Care Screening Not Applicable Not Applicable  Medication Review Press photographer) Complete Complete  Some recent data might be hidden

## 2020-09-28 NOTE — Plan of Care (Signed)
Pt has rested well during the overnight. He awoke approx 0100 slightly confused at his location. But this am 0500 pt alert and oriented x 4 and recalled the intermittent confusion. Vitals stable. BP soft 100/60. Cardizem continues every 6 hours. Problem: Education: Goal: Knowledge of General Education information will improve Description: Including pain rating scale, medication(s)/side effects and non-pharmacologic comfort measures Outcome: Progressing   Problem: Health Behavior/Discharge Planning: Goal: Ability to manage health-related needs will improve Outcome: Progressing   Problem: Clinical Measurements: Goal: Ability to maintain clinical measurements within normal limits will improve Outcome: Progressing Goal: Will remain free from infection Outcome: Progressing Goal: Diagnostic test results will improve Outcome: Progressing Goal: Respiratory complications will improve Outcome: Progressing Goal: Cardiovascular complication will be avoided Outcome: Progressing   Problem: Activity: Goal: Risk for activity intolerance will decrease Outcome: Progressing   Problem: Nutrition: Goal: Adequate nutrition will be maintained Outcome: Progressing   Problem: Coping: Goal: Level of anxiety will decrease Outcome: Progressing   Problem: Elimination: Goal: Will not experience complications related to bowel motility Outcome: Progressing Goal: Will not experience complications related to urinary retention Outcome: Progressing   Problem: Pain Managment: Goal: General experience of comfort will improve Outcome: Progressing   Problem: Safety: Goal: Ability to remain free from injury will improve Outcome: Progressing   Problem: Skin Integrity: Goal: Risk for impaired skin integrity will decrease Outcome: Progressing

## 2020-09-28 NOTE — Progress Notes (Signed)
PROGRESS NOTE    Jeremiah [name]  YIR:[id number] DOB: Jun 29, 1950 DOA: 09/26/2020 PCP: System, Provider Not In   Chief complaint.  Shortness of breath. Brief Narrative:   Jeremiah [name] a 70 y.o.malewith medical history significant forchronic hypoxic respiratory failure on 2 L, severe copd, tobacco abuse, hyponatremia, who presents with shortness of breath for about a week time. Upon arriving the hospital, he was hypoxic, he was placed on 4 L oxygen, later increased to 6 L.  Sodium level 126, chest x-ray showed right lower lobe infiltrates.  Procalcitonin level less than 0.1, BNP 20.  Patient with a placed on IV steroids, then changed to oral.  Patient was also on cefepime for possible pneumonia.  Patient does has history of dysphagia, he also has acid reflex, he cough worse in the morning.  Assessment & Plan:   Principal Problem:   COPD with acute exacerbation ([place]) Active Problems:   Acute on chronic respiratory failure with hypoxia (HCC)   Tobacco dependency   Hyponatremia  #1.  Acute on chronic hypoxemic respiratory failure. COPD exacerbation. Right lower lobe pneumonia, most likely aspiration pneumonia. Patient still have short of breath with exertion, on 4 L oxygen (home oxygen at 2 L). No evidence of congestive heart failure. Continue steroids. Patient is a pending for speech evaluation, most likely aspiration pneumonia.  Patient is penicillin allergy, continue cefepime until discharge.   #2.  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 Discontinue high-dose thiazide. Patient has not been drinking since last year. Patient is placed on fluid restriction and salt tablets 1 g twice a day.  Sodium level is gradually getting better.  Patient also has a chronic hyponatremia with sodium level 129.  #3 essential hypertension  Continue diltiazem  DVT prophylaxis: Lovenox Code Status: Full Family Communication:  Disposition Plan:  .   Status is:  Inpatient  Remains inpatient appropriate because:Inpatient level of care appropriate due to severity of illness   Dispo: The patient is from: Home              Anticipated d/c is to: Home              Patient currently is not medically stable to d/c.   Difficult to place patient No        I/O last 3 completed shifts: In: 1795.8 [P.O.:1560; I.V.:35.8; IV Piggyback:200] Out: 2900 [Urine:2900] No intake/output data recorded.     Consultants:   None  Procedures: None  Antimicrobials: Cefepime  Subjective: Patient still on 4 L oxygen, short of breath with minimal exertion.  Cough, with white mucus. Denied any fever or chills. No dysuria hematuria No abdominal pain or nausea vomiting.  Objective: Vitals:   09/28/20 0109 09/28/20 0329 09/28/20 0720 09/28/20 0802  BP:  100/60  (!) 90/54  Pulse:  82  69  Resp:  16  17  Temp:  97.6 F (36.4 C)  98.2 F (36.8 C)  TempSrc:  Oral  Oral  SpO2: 92% 93% 93% 94%  Weight:      Height:        Intake/Output Summary (Last 24 hours) at 09/28/2020 1111 Last data filed at 09/28/2020 0500 Gross per 24 hour  Intake 595.84 ml  Output 1300 ml  Net -704.16 ml   Filed Weights   09/26/20 1337 09/26/20 2025  Weight: 71.7 kg 75.3 kg    Examination:  General exam: Appears calm and comfortable  Respiratory system: Significant decreased breathing sounds. Respiratory effort normal. Cardiovascular system: S1 &  S2 heard, RRR. No JVD, murmurs, rubs, gallops or clicks. No pedal edema. Gastrointestinal system: Abdomen is nondistended, soft and nontender. No organomegaly or masses felt. Normal bowel sounds heard. Central nervous system: Alert and oriented. No focal neurological deficits. Extremities: Symmetric  Skin: No rashes, lesions or ulcers Psychiatry: Judgement and insight appear normal. Mood & affect appropriate.     Data Reviewed: I have personally reviewed following labs and imaging studies  CBC: Recent Labs  Lab  09/26/20 1357 09/27/20 0512 09/28/20 0501  WBC 10.6* 5.6 16.1*  NEUTROABS 7.9*  --  13.6*  HGB 12.2* 11.9* 11.6*  HCT 34.8* 34.6* 33.8*  MCV 84.7 84.8 84.7  PLT 329 321 094   Basic Metabolic Panel: Recent Labs  Lab 09/26/20 1357 09/27/20 0512 09/28/20 0501  NA 126* 125* 128*  K 4.4 4.7 4.1  CL 91* 91* 94*  CO2 25 25 25   GLUCOSE 106* 175* 142*  BUN 26* 25* 25*  CREATININE 1.01 0.72 0.62  CALCIUM 8.7* 8.6* 8.3*  MG  --   --  2.0   GFR: Estimated Creatinine Clearance: 91.5 mL/min (by C-G formula based on SCr of 0.62 mg/dL). Liver Function Tests: Recent Labs  Lab 09/27/20 0512  AST 24  ALT 18  ALKPHOS 103  BILITOT 0.4  PROT 7.0  ALBUMIN 3.4*   No results for input(s): LIPASE, AMYLASE in the last 168 hours. No results for input(s): AMMONIA in the last 168 hours. Coagulation Profile: No results for input(s): INR, PROTIME in the last 168 hours. Cardiac Enzymes: No results for input(s): CKTOTAL, CKMB, CKMBINDEX, TROPONINI in the last 168 hours. BNP (last 3 results) No results for input(s): PROBNP in the last 8760 hours. HbA1C: No results for input(s): HGBA1C in the last 72 hours. CBG: No results for input(s): GLUCAP in the last 168 hours. Lipid Profile: No results for input(s): CHOL, HDL, LDLCALC, TRIG, CHOLHDL, LDLDIRECT in the last 72 hours. Thyroid Function Tests: No results for input(s): TSH, T4TOTAL, FREET4, T3FREE, THYROIDAB in the last 72 hours. Anemia Panel: No results for input(s): VITAMINB12, FOLATE, FERRITIN, TIBC, IRON, RETICCTPCT in the last 72 hours. Sepsis Labs: Recent Labs  Lab 09/27/20 0512  PROCALCITON <0.10    Recent Results (from the past 240 hour(s))  Resp Panel by RT-PCR (Flu A&B, Covid) Nasopharyngeal Swab     Status: None   Collection Time: 09/26/20  1:57 PM   Specimen: Nasopharyngeal Swab; Nasopharyngeal(NP) swabs in vial transport medium  Result Value Ref Range Status   SARS Coronavirus 2 by RT PCR NEGATIVE NEGATIVE Final     Comment: (NOTE) SARS-CoV-2 target nucleic acids are NOT DETECTED.  The SARS-CoV-2 RNA is generally detectable in upper respiratory specimens during the acute phase of infection. The lowest concentration of SARS-CoV-2 viral copies this assay can detect is 138 copies/mL. A negative result does not preclude SARS-Cov-2 infection and should not be used as the sole basis for treatment or other patient management decisions. A negative result may occur with  improper specimen collection/handling, submission of specimen other than nasopharyngeal swab, presence of viral mutation(s) within the areas targeted by this assay, and inadequate number of viral copies(<138 copies/mL). A negative result must be combined with clinical observations, patient history, and epidemiological information. The expected result is Negative.  Fact Sheet for Patients:  EntrepreneurPulse.com.au  Fact Sheet for Healthcare Providers:  IncredibleEmployment.be  This test is no t yet approved or cleared by the [place] FDA and  has been authorized for detection and/or diagnosis of  SARS-CoV-2 by FDA under an Emergency Use Authorization (EUA). This EUA will remain  in effect (meaning this test can be used) for the duration of the COVID-19 declaration under Section 564(b)(1) of the Act, 21 U.S.C.section 360bbb-3(b)(1), unless the authorization is terminated  or revoked sooner.       Influenza A by PCR NEGATIVE NEGATIVE Final   Influenza B by PCR NEGATIVE NEGATIVE Final    Comment: (NOTE) The Xpert Xpress SARS-CoV-2/FLU/RSV plus assay is intended as an aid in the diagnosis of influenza from Nasopharyngeal swab specimens and should not be used as a sole basis for treatment. Nasal washings and aspirates are unacceptable for Xpert Xpress SARS-CoV-2/FLU/RSV testing.  Fact Sheet for Patients: EntrepreneurPulse.com.au  Fact Sheet for Healthcare  Providers: IncredibleEmployment.be  This test is not yet approved or cleared by the [place] FDA and has been authorized for detection and/or diagnosis of SARS-CoV-2 by FDA under an Emergency Use Authorization (EUA). This EUA will remain in effect (meaning this test can be used) for the duration of the COVID-19 declaration under Section 564(b)(1) of the Act, 21 U.S.C. section 360bbb-3(b)(1), unless the authorization is terminated or revoked.  Performed at [facility], [address], [place], [place] [address]          Radiology Studies: DG Chest 2 View  Result Date: 09/26/2020 CLINICAL DATA:  Shortness of breath EXAM: CHEST - 2 VIEW COMPARISON:  06/17/2020 FINDINGS: Heart is normal size. There is hyperinflation of the lungs compatible with COPD. Increased markings throughout the lungs likely reflects chronic interstitial lung disease. Superimposed increased opacity at the right lung base concerning for superimposed infiltrate. No effusions. Aortic atherosclerosis. No acute bony abnormality. IMPRESSION: COPD/chronic changes. Increased opacity at the right lung base concerning for superimposed infiltrate. Electronically Signed   By: Rolm Baptise M.D.   On: 09/26/2020 14:13        Scheduled Meds: . atorvastatin  40 mg Oral Daily  . diltiazem  120 mg Oral Q6H  . enoxaparin (LOVENOX) injection  40 mg Subcutaneous Q24H  . fluticasone furoate-vilanterol  1 puff Inhalation Daily  . ipratropium-albuterol  3 mL Nebulization Q6H  . montelukast  10 mg Oral QHS  . nicotine  21 mg Transdermal Daily  . pantoprazole  20 mg Oral Daily  . predniSONE  40 mg Oral Q breakfast  . sodium chloride flush  3 mL Intravenous Q12H  . sodium chloride  1 g Oral BID WC   Continuous Infusions: . sodium chloride Stopped (09/27/20 1130)  . ceFEPime (MAXIPIME) IV 2 g (09/28/20 0949)     LOS: 2 days    Time spent: 26 minutes    Sharen Hones, MD [facility]   To contact the attending provider between 7A-7P or the covering provider during after hours 7P-7A, please log into the web site [email or web address] and access using universal [place] password for that web site. If you do not have the password, please call the hospital operator.  09/28/2020, 11:11 AM

## 2020-09-29 ENCOUNTER — Encounter: Payer: Self-pay | Admitting: Obstetrics and Gynecology

## 2020-09-29 DIAGNOSIS — E871 Hypo-osmolality and hyponatremia: Secondary | ICD-10-CM | POA: Diagnosis not present

## 2020-09-29 DIAGNOSIS — J9621 Acute and chronic respiratory failure with hypoxia: Secondary | ICD-10-CM | POA: Diagnosis not present

## 2020-09-29 DIAGNOSIS — J441 Chronic obstructive pulmonary disease with (acute) exacerbation: Secondary | ICD-10-CM | POA: Diagnosis not present

## 2020-09-29 LAB — BASIC METABOLIC PANEL
Anion gap: 6 (ref 5–15)
BUN: 22 mg/dL (ref 8–23)
CO2: 28 mmol/L (ref 22–32)
Calcium: 8.7 mg/dL — ABNORMAL LOW (ref 8.9–10.3)
Chloride: 96 mmol/L — ABNORMAL LOW (ref 98–111)
Creatinine, Ser: 0.5 mg/dL — ABNORMAL LOW (ref 0.61–1.24)
GFR, Estimated: >60 mL/min (ref >60)
Glucose, Bld: 130 mg/dL — ABNORMAL HIGH (ref 70–99)
Potassium: 4.2 mmol/L (ref 3.5–5.1)
Sodium: 130 mmol/L — ABNORMAL LOW (ref 135–145)

## 2020-09-29 LAB — MAGNESIUM: Magnesium: 2 mg/dL (ref 1.7–2.4)

## 2020-09-29 MED ORDER — DILTIAZEM HCL ER COATED BEADS 120 MG PO CP24
120.0000 mg | ORAL_CAPSULE | Freq: Every day | ORAL | Status: DC
  Administered 2020-09-30: 09:00:00 120 mg via ORAL
  Filled 2020-09-29: qty 1

## 2020-09-29 NOTE — TOC Progression Note (Signed)
Transition of Care [facility]) - Progression Note    Patient Details  Name: Jeremiah [name] MRN: [id number] Date of Birth: 24-Mar-1951  Transition of Care [facility]) CM/SW [place], RN Phone Number: 09/29/2020, 2:06 PM  Clinical Narrative:   Patient stated portable oxygen is not working, will not have oxygen to transport home tomorrow.  TOC contacted [employer], as per Coralyn Mark, they will have a bottle to patient's hospital room by 9am tomorrow morning.         Expected Discharge Plan and Services                                                 Social Determinants of Health (SDOH) Interventions    Readmission Risk Interventions Readmission Risk Prevention Plan 09/27/2020 06/17/2020  Transportation Screening Complete Complete  PCP or Specialist Appt within 3-5 Days Complete Complete  HRI or Home Care Consult Complete Complete  Social Work Consult for [place] Planning/Counseling Not Complete Complete  SW consult not completed comments RNCM assigned to patient -  Palliative Care Screening Not Applicable Not Applicable  Medication Review Press photographer) Complete Complete  Some recent data might be hidden

## 2020-09-29 NOTE — Consult Note (Signed)
Pharmacy Antibiotic Note  Jeremiah [name] is a 70 y.o. male admitted on 09/26/2020 with acute COPD exacerbation and community acquired pneumonia. Pharmacy has been consulted for cefepime dosing.   Patient has a childhood allergy to penicillin reported as swelling. He has tolerated ceftriaxone in the past. -poss.aspiration PNA  Plan: Day 4- Cefepime 2 g IV q8h Monitor clinical picture and renal function F/U C&S, abx deescalation / LOT   Height: 6' (182.9 cm) Weight: 75.3 kg (166 lb 0.1 oz) IBW/kg (Calculated) : 77.6  Temp (24hrs), Avg:97.8 F (36.6 C), Min:97.4 F (36.3 C), Max:98.4 F (36.9 C)  Recent Labs  Lab 09/26/20 1357 09/27/20 0512 09/28/20 0501 09/29/20 0359  WBC 10.6* 5.6 16.1*  --   CREATININE 1.01 0.72 0.62 0.50*    Estimated Creatinine Clearance: 91.5 mL/min (A) (by C-G formula based on SCr of 0.5 mg/dL (L)).    Allergies  Allergen Reactions  . Penicillins Anaphylaxis, Swelling and Other (See Comments)    Tolerated Ceftriaxone 06/2020 05/2020 childhood reaction and his mother told him that he "swelled up."  Had a PCN reaction causing immediate rash, facial/tongue/throat swelling, SOB or lightheadedness with hypotension: Yes Had a PCN reaction causing severe rash involving mucus membranes or skin necrosis: No Had a PCN reaction that required hospitalization No Had a PCN reaction occurring within the last 10 years: No If all the above answers are "NO", may proceed with Cephalosporin use.    Antimicrobials this admission: 5/22 Cefepime >>  5/22 Levofloxacin x1  Dose adjustments this admission: n/a  Microbiology results: 5/22 Sputum: pending  Thank you for allowing pharmacy to be a part of this patient's care.  Nicholus Chandran A, PharmD 09/29/2020 10:39 AM

## 2020-09-29 NOTE — Progress Notes (Signed)
   09/29/20 0550  Assess: MEWS Score  Resp 20  Assess: MEWS Score  MEWS Temp 0  MEWS Systolic 1  MEWS Pulse 0  MEWS RR 0  MEWS LOC 0  MEWS Score 1  MEWS Score Color Green  Assess: if the MEWS score is Yellow or Red  Were vital signs taken at a resting state? Yes  Focused Assessment No change from prior assessment  Early Detection of Sepsis Score *See Row Information* Low  MEWS guidelines implemented *See Row Information* Yes  Treat  MEWS Interventions Other (Comment) (msg provider regarding bp and scheduled cardizem.)  Pain Scale 0-10  Pain Score 0  Patients response to intervention Effective  Escalate  MEWS: Escalate Yellow: discuss with charge nurse/RN and consider discussing with provider and RRT  Notify: Charge Nurse/RN  Name of Charge Nurse/RN Optometrist  Date Charge Nurse/RN Notified 09/29/20  Time Charge Nurse/RN Notified 0700  Notify: Provider  Provider Name/Title Dr. Sidney Ace  Date Provider Notified 09/29/20  Time Provider Notified [phone]  Notification Type  (msg)  Notification Reason Other (Comment) (SBP 97/63 order parameters added and cardizem held)  Provider response See new orders  Date of Provider Response 09/29/20  Time of Provider Response [phone]  Document  Patient Outcome Stabilized after interventions

## 2020-09-29 NOTE — Plan of Care (Signed)

## 2020-09-29 NOTE — Care Management Important Message (Signed)
Important Message  Patient Details  Name: Jeremiah [name] MRN: [id number] Date of Birth: 03-28-1951   Medicare Important Message Given:  Yes     Juliann Pulse A Eloyce Bultman 09/29/2020, 2:00 PM

## 2020-09-29 NOTE — Progress Notes (Addendum)
Progress Note    Jeremiah [name]  LSL:[id number] DOB: October 11, 1950  DOA: 09/26/2020 PCP: System, Provider Not In      Brief Narrative:    Medical records reviewed and are as summarized below:  Jeremiah [name] is a 70 y.o. male admitted for COPD exacerbation, right lower lobe pneumonia and acute on chronic hypoxic respiratory failure.      Assessment/Plan:   Principal Problem:   COPD with acute exacerbation (HCC) Active Problems:   Acute on chronic respiratory failure with hypoxia (HCC)   Tobacco dependency   Hyponatremia    Body mass index is 22.51 kg/m.   COPD exacerbation: Continue prednisone and bronchodilators  Right lower lobe pneumonia, possible aspiration pneumonia: Change IV cefepime to Augmentin.  Acute on chronic hypoxic respiratory failure: Continue 2 L/min oxygen via nasal cannula.  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  Hypertension: BP is on the low side.  Decrease diltiazem.  Change short acting diltiazem to long-acting diltiazem.  Tobacco use disorder: Counseled to quit smoking cigarettes.   Diet Order            Diet regular Room service appropriate? Yes; Fluid consistency: Thin; Fluid restriction: 1500 mL Fluid  Diet effective now                    Consultants:  None  Procedures:  None    Medications:   . atorvastatin  40 mg Oral Daily  . chlorpheniramine-HYDROcodone  5 mL Oral Q12H  . diltiazem  120 mg Oral Q6H  . enoxaparin (LOVENOX) injection  40 mg Subcutaneous Q24H  . famotidine  20 mg Oral BID  . fluticasone furoate-vilanterol  1 puff Inhalation Daily  . ipratropium-albuterol  3 mL Nebulization Q6H  . montelukast  10 mg Oral QHS  . nicotine  21 mg Transdermal Daily  . pantoprazole  40 mg Oral Daily  . predniSONE  40 mg Oral Q breakfast  . sodium chloride flush  3 mL Intravenous Q12H  . sodium chloride  1 g Oral BID WC   Continuous Infusions: . sodium chloride  Stopped (09/27/20 1130)  . ceFEPime (MAXIPIME) IV 2 g (09/29/20 1230)     Anti-infectives (From admission, onward)   Start     Dose/Rate Route Frequency Ordered Stop   09/26/20 1800  ceFEPIme (MAXIPIME) 2 g in sodium chloride 0.9 % 100 mL IVPB        2 g 200 mL/hr over 30 Minutes Intravenous Every 8 hours 09/26/20 1721     09/26/20 1545  levofloxacin (LEVAQUIN) IVPB 750 mg        750 mg 100 mL/hr over 90 Minutes Intravenous  Once 09/26/20 1534 09/26/20 1733   09/26/20 1545  vancomycin (VANCOREADY) IVPB 1500 mg/300 mL  Status:  Discontinued        1,500 mg 150 mL/hr over 120 Minutes Intravenous  Once 09/26/20 1537 09/26/20 1714             Family Communication/Anticipated D/C date and plan/Code Status   DVT prophylaxis: enoxaparin (LOVENOX) injection 40 mg Start: 09/26/20 2200     Code Status: Full Code  Family Communication: None Disposition Plan:    Status is: Inpatient  Remains inpatient appropriate because:Inpatient level of care appropriate due to severity of illness   Dispo: The patient is from: Home              Anticipated d/c is to: Home  Patient currently is not medically stable to d/c.   Difficult to place patient No           Subjective:   C/o dizziness.  He also complains of shortness of breath with exertion.  Objective:    Vitals:   09/29/20 0550 09/29/20 0755 09/29/20 1031 09/29/20 1119  BP:  (!) 104/54  112/60  Pulse:  73  79  Resp: 20 20  20   Temp:  97.6 F (36.4 C)  97.6 F (36.4 C)  TempSrc:  Oral  Axillary  SpO2:  92% 94% 97%  Weight:      Height:       No data found.   Intake/Output Summary (Last 24 hours) at 09/29/2020 1507 Last data filed at 09/29/2020 1417 Gross per 24 hour  Intake 720 ml  Output 1650 ml  Net -930 ml   Filed Weights   09/26/20 1337 09/26/20 2025  Weight: 71.7 kg 75.3 kg    Exam:  GEN: NAD SKIN: Warm and dry EYES: No pallor or icterus ENT: MMM CV: RRR PULM: CTA B ABD:  soft, ND, NT, +BS CNS: AAO x 3, non focal EXT: No edema or tenderness        Data Reviewed:   I have personally reviewed following labs and imaging studies:  Labs: Labs show the following:   Basic Metabolic Panel: Recent Labs  Lab 09/26/20 1357 09/27/20 0512 09/28/20 0501 09/29/20 0359  NA 126* 125* 128* 130*  K 4.4 4.7 4.1 4.2  CL 91* 91* 94* 96*  CO2 25 25 25 28   GLUCOSE 106* 175* 142* 130*  BUN 26* 25* 25* 22  CREATININE 1.01 0.72 0.62 0.50*  CALCIUM 8.7* 8.6* 8.3* 8.7*  MG  --   --  2.0 2.0   GFR Estimated Creatinine Clearance: 91.5 mL/min (A) (by C-G formula based on SCr of 0.5 mg/dL (L)). Liver Function Tests: Recent Labs  Lab 09/27/20 0512  AST 24  ALT 18  ALKPHOS 103  BILITOT 0.4  PROT 7.0  ALBUMIN 3.4*   No results for input(s): LIPASE, AMYLASE in the last 168 hours. No results for input(s): AMMONIA in the last 168 hours. Coagulation profile No results for input(s): INR, PROTIME in the last 168 hours.  CBC: Recent Labs  Lab 09/26/20 1357 09/27/20 0512 09/28/20 0501  WBC 10.6* 5.6 16.1*  NEUTROABS 7.9*  --  13.6*  HGB 12.2* 11.9* 11.6*  HCT 34.8* 34.6* 33.8*  MCV 84.7 84.8 84.7  PLT 329 321 331   Cardiac Enzymes: No results for input(s): CKTOTAL, CKMB, CKMBINDEX, TROPONINI in the last 168 hours. BNP (last 3 results) No results for input(s): PROBNP in the last 8760 hours. CBG: No results for input(s): GLUCAP in the last 168 hours. D-Dimer: No results for input(s): DDIMER in the last 72 hours. Hgb A1c: No results for input(s): HGBA1C in the last 72 hours. Lipid Profile: No results for input(s): CHOL, HDL, LDLCALC, TRIG, CHOLHDL, LDLDIRECT in the last 72 hours. Thyroid function studies: No results for input(s): TSH, T4TOTAL, T3FREE, THYROIDAB in the last 72 hours.  Invalid input(s): FREET3 Anemia work up: No results for input(s): VITAMINB12, FOLATE, FERRITIN, TIBC, IRON, RETICCTPCT in the last 72 hours. Sepsis Labs: Recent Labs   Lab 09/26/20 1357 09/27/20 0512 09/28/20 0501  PROCALCITON  --  <0.10  --   WBC 10.6* 5.6 16.1*    Microbiology Recent Results (from the past 240 hour(s))  Resp Panel by RT-PCR (Flu A&B, Covid) Nasopharyngeal Swab  Status: None   Collection Time: 09/26/20  1:57 PM   Specimen: Nasopharyngeal Swab; Nasopharyngeal(NP) swabs in vial transport medium  Result Value Ref Range Status   SARS Coronavirus 2 by RT PCR NEGATIVE NEGATIVE Final    Comment: (NOTE) SARS-CoV-2 target nucleic acids are NOT DETECTED.  The SARS-CoV-2 RNA is generally detectable in upper respiratory specimens during the acute phase of infection. The lowest concentration of SARS-CoV-2 viral copies this assay can detect is 138 copies/mL. A negative result does not preclude SARS-Cov-2 infection and should not be used as the sole basis for treatment or other patient management decisions. A negative result may occur with  improper specimen collection/handling, submission of specimen other than nasopharyngeal swab, presence of viral mutation(s) within the areas targeted by this assay, and inadequate number of viral copies(<138 copies/mL). A negative result must be combined with clinical observations, patient history, and epidemiological information. The expected result is Negative.  Fact Sheet for Patients:  EntrepreneurPulse.com.au  Fact Sheet for Healthcare Providers:  IncredibleEmployment.be  This test is no t yet approved or cleared by the [place] FDA and  has been authorized for detection and/or diagnosis of SARS-CoV-2 by FDA under an Emergency Use Authorization (EUA). This EUA will remain  in effect (meaning this test can be used) for the duration of the COVID-19 declaration under Section 564(b)(1) of the Act, 21 U.S.C.section 360bbb-3(b)(1), unless the authorization is terminated  or revoked sooner.       Influenza A by PCR NEGATIVE NEGATIVE Final   Influenza B  by PCR NEGATIVE NEGATIVE Final    Comment: (NOTE) The Xpert Xpress SARS-CoV-2/FLU/RSV plus assay is intended as an aid in the diagnosis of influenza from Nasopharyngeal swab specimens and should not be used as a sole basis for treatment. Nasal washings and aspirates are unacceptable for Xpert Xpress SARS-CoV-2/FLU/RSV testing.  Fact Sheet for Patients: EntrepreneurPulse.com.au  Fact Sheet for Healthcare Providers: IncredibleEmployment.be  This test is not yet approved or cleared by the [place] FDA and has been authorized for detection and/or diagnosis of SARS-CoV-2 by FDA under an Emergency Use Authorization (EUA). This EUA will remain in effect (meaning this test can be used) for the duration of the COVID-19 declaration under Section 564(b)(1) of the Act, 21 U.S.C. section 360bbb-3(b)(1), unless the authorization is terminated or revoked.  Performed at [facility] [address] [place], [place] [address]     Procedures and diagnostic studies:  No results found.             LOS: 3 days   Sheletha Bow  [facility]   Pager on [email or web address]. If 7PM-7AM, please contact night-coverage at [email or web address]     09/29/2020, 3:07 PM

## 2020-09-29 NOTE — TOC Progression Note (Signed)
Transition of Care [facility]) - Progression Note    Patient Details  Name: Jeremiah [name] MRN: [id number] Date of Birth: 06-11-1950  Transition of Care [facility]) CM/SW [place], RN Phone Number: 09/29/2020, 10:47 AM  Clinical Narrative:   TOC in to see patient re: discharge today.  Patient states that he is returning home and he feels safe to do so.  He states that it is quite upsetting to him that someone would call and inform us that he is not safe at home. He states that he would not stay in a place where he does not feel safe or supported.    Patient has home oxygen service with [employer], states he has a ride home at noon, but they do not have a tank for him to go home with.  Patient stated he was not sure that he felt strong enough to go home today, care nurse notified, she will notify discharging doctor.  TOC to follow through discharge.         Expected Discharge Plan and Services                                                 Social Determinants of Health (SDOH) Interventions    Readmission Risk Interventions Readmission Risk Prevention Plan 09/27/2020 06/17/2020  Transportation Screening Complete Complete  PCP or Specialist Appt within 3-5 Days Complete Complete  HRI or Home Care Consult Complete Complete  Social Work Consult for [place] Planning/Counseling Not Complete Complete  SW consult not completed comments RNCM assigned to patient -  Palliative Care Screening Not Applicable Not Applicable  Medication Review Press photographer) Complete Complete  Some recent data might be hidden

## 2020-09-30 MED ORDER — MONTELUKAST SODIUM 10 MG PO TABS: 10.0000 mg | ORAL_TABLET | Freq: Every day | ORAL | 0 refills | Status: AC

## 2020-09-30 MED ORDER — DILTIAZEM HCL 120 MG PO TABS: 120.0000 mg | ORAL_TABLET | Freq: Two times a day (BID) | ORAL | Status: DC

## 2020-09-30 MED ORDER — CEFPODOXIME PROXETIL 200 MG PO TABS: 200.0000 mg | ORAL_TABLET | Freq: Two times a day (BID) | ORAL | 0 refills | Status: AC

## 2020-09-30 MED ORDER — PANTOPRAZOLE SODIUM 20 MG PO TBEC: 20.0000 mg | DELAYED_RELEASE_TABLET | Freq: Every day | ORAL | 0 refills | Status: AC

## 2020-09-30 MED ORDER — BREO ELLIPTA 100-25 MCG/INH IN AEPB: 1.0000 | INHALATION_SPRAY | Freq: Every day | RESPIRATORY_TRACT | 0 refills | Status: AC

## 2020-09-30 NOTE — Discharge Summary (Signed)
Physician Discharge Summary  Jeremiah [name] ZTI:[id number] DOB: [birth year] DOA: 09/26/2020  PCP: System, Provider Not In  Admit date: 09/26/2020 Discharge date: 09/30/2020  Discharge disposition: Home   Recommendations for Outpatient Follow-Up:   Follow up with PCP in 1 week   Discharge Diagnosis:   Principal Problem:   COPD with acute exacerbation ([place]) Active Problems:   Acute on chronic respiratory failure with hypoxia ([place])   Tobacco dependency   Hyponatremia    Discharge Condition: Stable.  Diet recommendation:  Diet Order            Diet - low sodium heart healthy           Diet regular Room service appropriate? Yes; Fluid consistency: Thin; Fluid restriction: 1500 mL Fluid  Diet effective now                   Code Status: Full Code     Hospital Course:   [name] is a 70 y.o. male with medical history significant for tobacco use disorder, COPD, pneumonia, chronic hypoxic respiratory failure on 2 L/min oxygen, hypertension, GERD, who presented to the hospital because of increasing shortness of breath worse with exertion, productive cough, chest pain and decreased oxygen saturation.  Reportedly, his oxygen saturation was 74% on 2 L/min oxygen.  He was admitted to the hospital for COPD exacerbation, right lower lobe pneumonia and acute on chronic hypoxic respiratory failure.  He was initially requiring 6 L/min oxygen via nasal cannula.  He was treated with steroids, antibiotics and bronchodilators.  His condition has improved and he has been able to ambulate without any problems.  He is back to his baseline oxygen requirement.  He is deemed stable for discharge to home.      Discharge Exam:    Vitals:   09/30/20 0024 09/30/20 0131 09/30/20 0503 09/30/20 0727  BP: 109/60  114/67 101/65  Pulse: 77  80 86  Resp: 17  19 16   Temp: 97.7 F (36.5 C)  97.7 F (36.5 C) 99.1 F (37.3 C)  TempSrc: Oral  Oral Oral  SpO2: 95% 93% 96% 94%   Weight:      Height:         GEN: NAD SKIN: Warm and dry EYES: No pallor or icterus ENT: MMM CV: RRR PULM: CTA B ABD: soft, ND, NT, +BS CNS: AAO x 3, non focal EXT: No edema or tenderness   The results of significant diagnostics from this hospitalization (including imaging, microbiology, ancillary and laboratory) are listed below for reference.     Procedures and Diagnostic Studies:   DG Chest 2 View  Result Date: 09/26/2020 CLINICAL DATA:  Shortness of breath EXAM: CHEST - 2 VIEW COMPARISON:  06/17/2020 FINDINGS: Heart is normal size. There is hyperinflation of the lungs compatible with COPD. Increased markings throughout the lungs likely reflects chronic interstitial lung disease. Superimposed increased opacity at the right lung base concerning for superimposed infiltrate. No effusions. Aortic atherosclerosis. No acute bony abnormality. IMPRESSION: COPD/chronic changes. Increased opacity at the right lung base concerning for superimposed infiltrate. Electronically Signed   By: Rolm Baptise M.D.   On: 09/26/2020 14:13     Labs:   Basic Metabolic Panel: Recent Labs  Lab 09/26/20 1357 09/27/20 0512 09/28/20 0501 09/29/20 0359  NA 126* 125* 128* 130*  K 4.4 4.7 4.1 4.2  CL 91* 91* 94* 96*  CO2 25 25 25 28   GLUCOSE 106* 175* 142* 130*  BUN 26*  25* 25* 22  CREATININE 1.01 0.72 0.62 0.50*  CALCIUM 8.7* 8.6* 8.3* 8.7*  MG  --   --  2.0 2.0   GFR Estimated Creatinine Clearance: 91.5 mL/min (A) (by C-G formula based on SCr of 0.5 mg/dL (L)). Liver Function Tests: Recent Labs  Lab 09/27/20 0512  AST 24  ALT 18  ALKPHOS 103  BILITOT 0.4  PROT 7.0  ALBUMIN 3.4*   No results for input(s): LIPASE, AMYLASE in the last 168 hours. No results for input(s): AMMONIA in the last 168 hours. Coagulation profile No results for input(s): INR, PROTIME in the last 168 hours.  CBC: Recent Labs  Lab 09/26/20 1357 09/27/20 0512 09/28/20 0501  WBC 10.6* 5.6 16.1*  NEUTROABS  7.9*  --  13.6*  HGB 12.2* 11.9* 11.6*  HCT 34.8* 34.6* 33.8*  MCV 84.7 84.8 84.7  PLT 329 321 331   Cardiac Enzymes: No results for input(s): CKTOTAL, CKMB, CKMBINDEX, TROPONINI in the last 168 hours. BNP: Invalid input(s): POCBNP CBG: No results for input(s): GLUCAP in the last 168 hours. D-Dimer No results for input(s): DDIMER in the last 72 hours. Hgb A1c No results for input(s): HGBA1C in the last 72 hours. Lipid Profile No results for input(s): CHOL, HDL, LDLCALC, TRIG, CHOLHDL, LDLDIRECT in the last 72 hours. Thyroid function studies No results for input(s): TSH, T4TOTAL, T3FREE, THYROIDAB in the last 72 hours.  Invalid input(s): FREET3 Anemia work up No results for input(s): VITAMINB12, FOLATE, FERRITIN, TIBC, IRON, RETICCTPCT in the last 72 hours. Microbiology Recent Results (from the past 240 hour(s))  Resp Panel by RT-PCR (Flu A&B, Covid) Nasopharyngeal Swab     Status: None   Collection Time: 09/26/20  1:57 PM   Specimen: Nasopharyngeal Swab; Nasopharyngeal(NP) swabs in vial transport medium  Result Value Ref Range Status   SARS Coronavirus 2 by RT PCR NEGATIVE NEGATIVE Final    Comment: (NOTE) SARS-CoV-2 target nucleic acids are NOT DETECTED.  The SARS-CoV-2 RNA is generally detectable in upper respiratory specimens during the acute phase of infection. The lowest concentration of SARS-CoV-2 viral copies this assay can detect is 138 copies/mL. A negative result does not preclude SARS-Cov-2 infection and should not be used as the sole basis for treatment or other patient management decisions. A negative result may occur with  improper specimen collection/handling, submission of specimen other than nasopharyngeal swab, presence of viral mutation(s) within the areas targeted by this assay, and inadequate number of viral copies(<138 copies/mL). A negative result must be combined with clinical observations, patient history, and epidemiological information. The  expected result is Negative.  Fact Sheet for Patients:  EntrepreneurPulse.com.au  Fact Sheet for Healthcare Providers:  IncredibleEmployment.be  This test is no t yet approved or cleared by the [place] FDA and  has been authorized for detection and/or diagnosis of SARS-CoV-2 by FDA under an Emergency Use Authorization (EUA). This EUA will remain  in effect (meaning this test can be used) for the duration of the COVID-19 declaration under Section 564(b)(1) of the Act, 21 U.S.C.section 360bbb-3(b)(1), unless the authorization is terminated  or revoked sooner.       Influenza A by PCR NEGATIVE NEGATIVE Final   Influenza B by PCR NEGATIVE NEGATIVE Final    Comment: (NOTE) The Xpert Xpress SARS-CoV-2/FLU/RSV plus assay is intended as an aid in the diagnosis of influenza from Nasopharyngeal swab specimens and should not be used as a sole basis for treatment. Nasal washings and aspirates are unacceptable for Xpert Xpress SARS-CoV-2/FLU/RSV testing.  Fact Sheet for Patients: EntrepreneurPulse.com.au  Fact Sheet for Healthcare Providers: IncredibleEmployment.be  This test is not yet approved or cleared by the [place] FDA and has been authorized for detection and/or diagnosis of SARS-CoV-2 by FDA under an Emergency Use Authorization (EUA). This EUA will remain in effect (meaning this test can be used) for the duration of the COVID-19 declaration under Section 564(b)(1) of the Act, 21 U.S.C. section 360bbb-3(b)(1), unless the authorization is terminated or revoked.  Performed at [facility], [address], [place], [place] [address]      Discharge Instructions:   Discharge Instructions    Diet - low sodium heart healthy   Complete by: As directed    Discharge instructions   Complete by: As directed    Please stop smoking   Increase activity slowly   Complete by: As directed       Allergies as of 09/30/2020      Reactions   Penicillins Anaphylaxis, Swelling, Other (See Comments)   Tolerated Ceftriaxone 06/2020 05/2020 childhood reaction and his mother told him that he "swelled up." Had a PCN reaction causing immediate rash, facial/tongue/throat swelling, SOB or lightheadedness with hypotension: Yes Had a PCN reaction causing severe rash involving mucus membranes or skin necrosis: No Had a PCN reaction that required hospitalization No Had a PCN reaction occurring within the last 10 years: No If all the above answers are "NO", may proceed with Cephalosporin use.      Medication List    STOP taking these medications   lisinopril 10 MG tablet Commonly known as: ZESTRIL     TAKE these medications   acetaminophen 325 MG tablet Commonly known as: TYLENOL Take 2 tablets (650 mg total) by mouth every 6 (six) hours as needed for mild pain (or Fever >/= 101).   albuterol 108 (90 Base) MCG/ACT inhaler Commonly known as: VENTOLIN HFA Inhale into the lungs every 6 (six) hours as needed for wheezing or shortness of breath.   atorvastatin 40 MG tablet Commonly known as: LIPITOR Take 40 mg by mouth every evening.   Breo Ellipta 100-25 MCG/INH Aepb Generic drug: fluticasone furoate-vilanterol Inhale 1 puff into the lungs daily. What changed: how to take this   cefpodoxime 200 MG tablet Commonly known as: VANTIN Take 1 tablet (200 mg total) by mouth 2 (two) times daily for 2 days.   diltiazem 120 MG tablet Commonly known as: Cardizem Take 1 tablet (120 mg total) by mouth 2 (two) times daily. What changed: when to take this   docusate sodium 100 MG capsule Commonly known as: COLACE Take 100 mg by mouth daily as needed for mild constipation.   hydrochlorothiazide 25 MG tablet Commonly known as: HYDRODIURIL Take 25 mg by mouth daily.   ipratropium-albuterol 0.5-2.5 (3) MG/3ML Soln Commonly known as: DUONEB Take 3 mLs by nebulization every 6 (six) hours as  needed.   montelukast 10 MG tablet Commonly known as: SINGULAIR Take 1 tablet (10 mg total) by mouth at bedtime.   nicotine 21 mg/24hr patch Commonly known as: NICODERM CQ - dosed in mg/24 hours Place 21 mg onto the skin daily.   pantoprazole 20 MG tablet Commonly known as: PROTONIX Take 1 tablet (20 mg total) by mouth daily.   tiotropium 18 MCG inhalation capsule Commonly known as: SPIRIVA Place 18 mcg into inhaler and inhale daily.         Time coordinating discharge: 28 minutes  Signed:  Amyah Clawson  [facility] 09/30/2020, 9:02 AM  Pager on [email or web address]. If 7PM-7AM, please contact night-coverage at [email or web address]

## 2020-09-30 NOTE — TOC Transition Note (Signed)
Transition of Care [facility]) - CM/SW Discharge Note   Patient Details  Name: Jeremiah [name] MRN: [id number] Date of Birth: 01/08/1951  Transition of Care [facility]) CM/SW Contact:  Pete Pelt, RN Phone Number: 09/30/2020, 9:11 AM   Clinical Narrative:   Patient discharging home today, reiterates that he feels safe to go home.  [employer] in patient's room, delivering portable oxygen tank.  Patient states he has [place] for PCP.           Patient Goals and CMS Choice        Discharge Placement                       Discharge Plan and Services                                     Social Determinants of Health (SDOH) Interventions     Readmission Risk Interventions Readmission Risk Prevention Plan 09/27/2020 06/17/2020  Transportation Screening Complete Complete  PCP or Specialist Appt within 3-5 Days Complete Complete  HRI or Home Care Consult Complete Complete  Social Work Consult for [place] Planning/Counseling Not Complete Complete  SW consult not completed comments RNCM assigned to patient -  Palliative Care Screening Not Applicable Not Applicable  Medication Review [employer] photographer) Complete Complete  Some recent data might be hidden

## 2020-10-18 ENCOUNTER — Inpatient Hospital Stay
Admission: EM | Admit: 2020-10-18 | Discharge: 2020-10-23 | DRG: 190 | Disposition: A | Discharge status: Home / Self Care | Payer: Medicare HMO | Attending: Internal Medicine | Admitting: Internal Medicine

## 2020-10-18 ENCOUNTER — Other Ambulatory Visit: Payer: Self-pay

## 2020-10-18 ENCOUNTER — Emergency Department: Payer: Medicare HMO

## 2020-10-18 DIAGNOSIS — Z79899 Other long term (current) drug therapy: Secondary | ICD-10-CM | POA: Diagnosis not present

## 2020-10-18 DIAGNOSIS — Z20822 Contact with and (suspected) exposure to covid-19: Secondary | ICD-10-CM | POA: Diagnosis present

## 2020-10-18 DIAGNOSIS — J440 Chronic obstructive pulmonary disease with acute lower respiratory infection: Secondary | ICD-10-CM | POA: Diagnosis present

## 2020-10-18 DIAGNOSIS — F17200 Nicotine dependence, unspecified, uncomplicated: Secondary | ICD-10-CM | POA: Diagnosis not present

## 2020-10-18 DIAGNOSIS — K219 Gastro-esophageal reflux disease without esophagitis: Secondary | ICD-10-CM | POA: Diagnosis present

## 2020-10-18 DIAGNOSIS — Z79891 Long term (current) use of opiate analgesic: Secondary | ICD-10-CM | POA: Diagnosis not present

## 2020-10-18 DIAGNOSIS — E785 Hyperlipidemia, unspecified: Secondary | ICD-10-CM | POA: Diagnosis present

## 2020-10-18 DIAGNOSIS — R945 Abnormal results of liver function studies: Secondary | ICD-10-CM | POA: Diagnosis not present

## 2020-10-18 DIAGNOSIS — I10 Essential (primary) hypertension: Secondary | ICD-10-CM | POA: Diagnosis present

## 2020-10-18 DIAGNOSIS — Z8616 Personal history of COVID-19: Secondary | ICD-10-CM | POA: Diagnosis not present

## 2020-10-18 DIAGNOSIS — E871 Hypo-osmolality and hyponatremia: Secondary | ICD-10-CM | POA: Diagnosis present

## 2020-10-18 DIAGNOSIS — Z880 Allergy status to penicillin: Secondary | ICD-10-CM

## 2020-10-18 DIAGNOSIS — Z7951 Long term (current) use of inhaled steroids: Secondary | ICD-10-CM

## 2020-10-18 DIAGNOSIS — J9621 Acute and chronic respiratory failure with hypoxia: Secondary | ICD-10-CM | POA: Diagnosis present

## 2020-10-18 DIAGNOSIS — I119 Hypertensive heart disease without heart failure: Secondary | ICD-10-CM | POA: Diagnosis present

## 2020-10-18 DIAGNOSIS — M7989 Other specified soft tissue disorders: Secondary | ICD-10-CM | POA: Diagnosis present

## 2020-10-18 DIAGNOSIS — J181 Lobar pneumonia, unspecified organism: Secondary | ICD-10-CM | POA: Diagnosis present

## 2020-10-18 DIAGNOSIS — R0602 Shortness of breath: Secondary | ICD-10-CM

## 2020-10-18 DIAGNOSIS — D638 Anemia in other chronic diseases classified elsewhere: Secondary | ICD-10-CM | POA: Diagnosis present

## 2020-10-18 DIAGNOSIS — D649 Anemia, unspecified: Secondary | ICD-10-CM | POA: Diagnosis present

## 2020-10-18 DIAGNOSIS — I872 Venous insufficiency (chronic) (peripheral): Secondary | ICD-10-CM | POA: Diagnosis present

## 2020-10-18 DIAGNOSIS — Z716 Tobacco abuse counseling: Secondary | ICD-10-CM

## 2020-10-18 DIAGNOSIS — Z7189 Other specified counseling: Secondary | ICD-10-CM | POA: Diagnosis not present

## 2020-10-18 DIAGNOSIS — F17210 Nicotine dependence, cigarettes, uncomplicated: Secondary | ICD-10-CM | POA: Diagnosis present

## 2020-10-18 DIAGNOSIS — Z515 Encounter for palliative care: Secondary | ICD-10-CM | POA: Diagnosis not present

## 2020-10-18 DIAGNOSIS — Z9981 Dependence on supplemental oxygen: Secondary | ICD-10-CM | POA: Diagnosis not present

## 2020-10-18 DIAGNOSIS — R7989 Other specified abnormal findings of blood chemistry: Secondary | ICD-10-CM | POA: Diagnosis present

## 2020-10-18 DIAGNOSIS — J9620 Acute and chronic respiratory failure, unspecified whether with hypoxia or hypercapnia: Secondary | ICD-10-CM

## 2020-10-18 DIAGNOSIS — J441 Chronic obstructive pulmonary disease with (acute) exacerbation: Secondary | ICD-10-CM | POA: Diagnosis present

## 2020-10-18 HISTORY — DX: Acute and chronic respiratory failure, unspecified whether with hypoxia or hypercapnia: J96.20

## 2020-10-18 LAB — RESP PANEL BY RT-PCR (FLU A&B, COVID) ARPGX2
Influenza A by PCR: NEGATIVE
Influenza B by PCR: NEGATIVE
SARS Coronavirus 2 by RT PCR: NEGATIVE

## 2020-10-18 LAB — BRAIN NATRIURETIC PEPTIDE: B Natriuretic Peptide: 47.4 pg/mL (ref 0.0–100.0)

## 2020-10-18 LAB — BASIC METABOLIC PANEL
Anion gap: 8 (ref 5–15)
BUN: 10 mg/dL (ref 8–23)
CO2: 23 mmol/L (ref 22–32)
Calcium: 8.3 mg/dL — ABNORMAL LOW (ref 8.9–10.3)
Chloride: 87 mmol/L — ABNORMAL LOW (ref 98–111)
Creatinine, Ser: 0.48 mg/dL — ABNORMAL LOW (ref 0.61–1.24)
GFR, Estimated: >60 mL/min (ref >60)
Glucose, Bld: 116 mg/dL — ABNORMAL HIGH (ref 70–99)
Potassium: 3.7 mmol/L (ref 3.5–5.1)
Sodium: 118 mmol/L — CL (ref 135–145)

## 2020-10-18 LAB — HEPATIC FUNCTION PANEL
ALT: 15 U/L (ref 0–44)
AST: 22 U/L (ref 15–41)
Albumin: 3.5 g/dL (ref 3.5–5.0)
Alkaline Phosphatase: 134 U/L — ABNORMAL HIGH (ref 38–126)
Bilirubin, Direct: 0.2 mg/dL (ref 0.0–0.2)
Indirect Bilirubin: 0.6 mg/dL (ref 0.3–0.9)
Total Bilirubin: 0.8 mg/dL (ref 0.3–1.2)
Total Protein: 6.9 g/dL (ref 6.5–8.1)

## 2020-10-18 LAB — CBC
HCT: 33.5 % — ABNORMAL LOW (ref 39.0–52.0)
Hemoglobin: 11.7 g/dL — ABNORMAL LOW (ref 13.0–17.0)
MCH: 29 pg (ref 26.0–34.0)
MCHC: 34.9 g/dL (ref 30.0–36.0)
MCV: 83.1 fL (ref 80.0–100.0)
Platelets: 277 10*3/uL (ref 150–400)
RBC: 4.03 MIL/uL — ABNORMAL LOW (ref 4.22–5.81)
RDW: 14.7 % (ref 11.5–15.5)
WBC: 8.9 10*3/uL (ref 4.0–10.5)
nRBC: 0 % (ref 0.0–0.2)

## 2020-10-18 LAB — TROPONIN I (HIGH SENSITIVITY): Troponin I (High Sensitivity): 5 ng/L (ref <18)

## 2020-10-18 MED ORDER — SODIUM CHLORIDE 0.9 % IV SOLN: Freq: Once | INTRAVENOUS | Status: AC

## 2020-10-18 MED ORDER — IOHEXOL 350 MG/ML SOLN
75.0000 mL | Freq: Once | INTRAVENOUS | Status: AC | PRN
  Administered 2020-10-18: 75 mL via INTRAVENOUS

## 2020-10-18 MED ORDER — LEVOFLOXACIN IN D5W 750 MG/150ML IV SOLN
750.0000 mg | INTRAVENOUS | Status: DC
  Administered 2020-10-19 – 2020-10-20 (×2): 750 mg via INTRAVENOUS
  Filled 2020-10-18 (×3): qty 150

## 2020-10-18 MED ORDER — HYDROCHLOROTHIAZIDE 25 MG PO TABS: 25.0000 mg | ORAL_TABLET | Freq: Every day | ORAL | Status: DC

## 2020-10-18 MED ORDER — ALBUTEROL SULFATE HFA 108 (90 BASE) MCG/ACT IN AERS: 2.0000 | INHALATION_SPRAY | Freq: Four times a day (QID) | RESPIRATORY_TRACT | Status: DC | PRN

## 2020-10-18 MED ORDER — LEVOFLOXACIN IN D5W 750 MG/150ML IV SOLN
750.0000 mg | Freq: Once | INTRAVENOUS | Status: AC
  Administered 2020-10-18: 750 mg via INTRAVENOUS
  Filled 2020-10-18: qty 150

## 2020-10-18 MED ORDER — DILTIAZEM HCL 30 MG PO TABS
120.0000 mg | ORAL_TABLET | Freq: Two times a day (BID) | ORAL | Status: DC
  Administered 2020-10-19 – 2020-10-23 (×9): 120 mg via ORAL
  Filled 2020-10-18: qty 2
  Filled 2020-10-18 (×8): qty 4

## 2020-10-18 MED ORDER — HEPARIN SODIUM (PORCINE) 5000 UNIT/ML IJ SOLN
5000.0000 [IU] | Freq: Three times a day (TID) | INTRAMUSCULAR | Status: DC
  Administered 2020-10-19 – 2020-10-23 (×10): 5000 [IU] via SUBCUTANEOUS
  Filled 2020-10-18 (×11): qty 1

## 2020-10-18 MED ORDER — IPRATROPIUM-ALBUTEROL 0.5-2.5 (3) MG/3ML IN SOLN
3.0000 mL | Freq: Once | RESPIRATORY_TRACT | Status: AC
  Administered 2020-10-18: 3 mL via RESPIRATORY_TRACT
  Filled 2020-10-18: qty 3

## 2020-10-18 MED ORDER — MONTELUKAST SODIUM 10 MG PO TABS
10.0000 mg | ORAL_TABLET | Freq: Every day | ORAL | Status: DC
  Administered 2020-10-19 – 2020-10-22 (×4): 10 mg via ORAL
  Filled 2020-10-18 (×5): qty 1

## 2020-10-18 MED ORDER — PANTOPRAZOLE SODIUM 20 MG PO TBEC
20.0000 mg | DELAYED_RELEASE_TABLET | Freq: Every day | ORAL | Status: DC
  Administered 2020-10-19 – 2020-10-20 (×2): 20 mg via ORAL
  Filled 2020-10-18 (×3): qty 1

## 2020-10-18 MED ORDER — IPRATROPIUM-ALBUTEROL 0.5-2.5 (3) MG/3ML IN SOLN
3.0000 mL | Freq: Four times a day (QID) | RESPIRATORY_TRACT | Status: DC
  Administered 2020-10-19 – 2020-10-22 (×16): 3 mL via RESPIRATORY_TRACT
  Filled 2020-10-18 (×15): qty 3

## 2020-10-18 MED ORDER — TIOTROPIUM BROMIDE MONOHYDRATE 18 MCG IN CAPS
18.0000 ug | ORAL_CAPSULE | Freq: Every day | RESPIRATORY_TRACT | Status: DC
  Administered 2020-10-19 – 2020-10-23 (×4): 18 ug via RESPIRATORY_TRACT
  Filled 2020-10-18: qty 5

## 2020-10-18 MED ORDER — ONDANSETRON HCL 4 MG/2ML IJ SOLN: 4.0000 mg | Freq: Four times a day (QID) | INTRAMUSCULAR | Status: DC | PRN

## 2020-10-18 MED ORDER — METHYLPREDNISOLONE SODIUM SUCC 125 MG IJ SOLR
80.0000 mg | Freq: Two times a day (BID) | INTRAMUSCULAR | Status: DC
  Administered 2020-10-19 – 2020-10-20 (×3): 80 mg via INTRAVENOUS
  Filled 2020-10-18 (×3): qty 2

## 2020-10-18 MED ORDER — ALBUTEROL SULFATE (2.5 MG/3ML) 0.083% IN NEBU: 3.0000 mL | INHALATION_SOLUTION | RESPIRATORY_TRACT | Status: DC | PRN

## 2020-10-18 MED ORDER — ONDANSETRON HCL 4 MG PO TABS: 4.0000 mg | ORAL_TABLET | Freq: Four times a day (QID) | ORAL | Status: DC | PRN

## 2020-10-18 MED ORDER — NICOTINE 21 MG/24HR TD PT24
21.0000 mg | MEDICATED_PATCH | Freq: Every day | TRANSDERMAL | Status: DC
  Administered 2020-10-19 – 2020-10-23 (×5): 21 mg via TRANSDERMAL
  Filled 2020-10-18 (×5): qty 1

## 2020-10-18 MED ORDER — ATORVASTATIN CALCIUM 20 MG PO TABS
40.0000 mg | ORAL_TABLET | Freq: Every evening | ORAL | Status: DC
  Administered 2020-10-19 – 2020-10-22 (×4): 40 mg via ORAL
  Filled 2020-10-18 (×4): qty 2

## 2020-10-18 MED ORDER — IPRATROPIUM-ALBUTEROL 0.5-2.5 (3) MG/3ML IN SOLN: 3.0000 mL | Freq: Four times a day (QID) | RESPIRATORY_TRACT | Status: DC | PRN

## 2020-10-18 MED ORDER — NICOTINE 14 MG/24HR TD PT24: 14.0000 mg | MEDICATED_PATCH | Freq: Every day | TRANSDERMAL | Status: DC

## 2020-10-18 MED ORDER — ACETAMINOPHEN 325 MG PO TABS
650.0000 mg | ORAL_TABLET | Freq: Four times a day (QID) | ORAL | Status: DC | PRN
  Administered 2020-10-21: 650 mg via ORAL
  Filled 2020-10-18 (×2): qty 2

## 2020-10-18 MED ORDER — FLUTICASONE FUROATE-VILANTEROL 100-25 MCG/INH IN AEPB
1.0000 | INHALATION_SPRAY | Freq: Every day | RESPIRATORY_TRACT | Status: DC
  Administered 2020-10-19 – 2020-10-23 (×5): 1 via RESPIRATORY_TRACT
  Filled 2020-10-18 (×2): qty 28

## 2020-10-18 NOTE — H&P (Signed)
History and Physical    Jeremiah [name] IOE:[id number] DOB: July 08, 1950 DOA: 10/18/2020  PCP: [facility]    Patient coming from:  Home   Chief Complaint:  Shortness of breath   HPI: Jeremiah [name] is a 70 y.o. male seen in ed with complaints of presenting to the emergency room with complaints of shortness of breath on 2 L of oxygen at home that he wears chronically at home for his COPD but his shortness of breath has been progressive and getting worse.  Per EMS patient was hypoxic at 72% on 2 L and was started on a nonrebreather, here patient was 10 transition to NIPPV.  Patient denies any cough chest pain palpitations headaches blurred vision dizziness abdominal issues urinary or bladder issues any gait or neurological issues.  And review of systems is negative.  Patient received Solu-Medrol in the EMS along with DuoNeb therapy prior to arrival.Pt states sob has been going on for past few days worse since past 24 and worse with exertion and laying flat.   Pt has past medical history of asthma, chronic respiratory failure on 2 L of home oxygen secondary to COPD, GERD, hypertension, tobacco abuse.  ED Course:  Vitals:   10/18/20 2030 10/18/20 2100 10/18/20 2130 10/18/20 2235  BP: 118/63 116/62 123/63 (!) 112/44  Pulse: 78 81 80 83  Resp: (!) 23 (!) 26 (!) 23 (!) 24  Temp:      TempSrc:      SpO2: 98% 93% 94% 95%  Weight:      Height:      In the emergency room patient is alert awake afebrile and on BiPAP currently. In the emergency room patient received normal saline 150 mL/h for 1 L followed by Levaquin, along with DuoNeb therapy x2. CMP shows hyponatremia with a sodium of 118, glucose of 116, creatinine of 0.48. Hyponatremia is chronic usually in the range of 120s to 130s.  Today is 118 is the lowest. CBC is stable with normal white count of 8.9 and hemoglobin of 11.7 which is chronic, anemia, platelets of 277. Respiratory panel is negative for influenza  and flu.  Review of Systems:  Review of Systems  Respiratory:  Positive for cough, sputum production, shortness of breath and wheezing.   All other systems reviewed and are negative.   Past Medical History:  Diagnosis Date   Asthma    COPD (chronic obstructive pulmonary disease) (HCC)    O2 dependent - 2L   GERD (gastroesophageal reflux disease)    HTN (hypertension)    Oxygen dependent    Tobacco dependence     Past Surgical History:  Procedure Laterality Date   CATARACT EXTRACTION W/PHACO Left 12/09/2019   Procedure: CATARACT EXTRACTION PHACO AND INTRAOCULAR LENS PLACEMENT (IOC) COMPLICATED LEFT 18.29 93:71.6;  Surgeon: Birder Robson, MD;  Location: [place];  Service: Ophthalmology;  Laterality: Left;  MILOOP VISION BLUE   CATARACT EXTRACTION W/PHACO Right 01/27/2020   Procedure: CATARACT EXTRACTION PHACO AND INTRAOCULAR LENS PLACEMENT ([place]) RIGHT VISION BLUE 26.83 02:07.6;  Surgeon: Birder Robson, MD;  Location: [place];  Service: Ophthalmology;  Laterality: Right;   HERNIA REPAIR     NECK SURGERY       reports that he has been smoking cigarettes. He has a 25.00 pack-year smoking history. He has never used smokeless tobacco. He reports current alcohol use. He reports that he does not use drugs.  Allergies  Allergen Reactions   Penicillins Anaphylaxis, Swelling and Other (  See Comments)    Tolerated Ceftriaxone 06/2020 05/2020 childhood reaction and his mother told him that he "swelled up."  Had a PCN reaction causing immediate rash, facial/tongue/throat swelling, SOB or lightheadedness with hypotension: Yes Had a PCN reaction causing severe rash involving mucus membranes or skin necrosis: No Had a PCN reaction that required hospitalization No Had a PCN reaction occurring within the last 10 years: No If all the above answers are "NO", may proceed with Cephalosporin use.    Family History  Problem Relation Age of Onset   Cancer - Colon Father     Congestive Heart Failure Mother     Prior to Admission medications   Medication Sig Start Date End Date Taking? Authorizing Provider  acetaminophen (TYLENOL) 325 MG tablet Take 2 tablets (650 mg total) by mouth every 6 (six) hours as needed for mild pain (or Fever >/= 101). 08/06/15   Dustin Flock, MD  albuterol (VENTOLIN HFA) 108 (90 Base) MCG/ACT inhaler Inhale into the lungs every 6 (six) hours as needed for wheezing or shortness of breath.    [provider]  atorvastatin (LIPITOR) 40 MG tablet Take 40 mg by mouth every evening.    [provider]  BREO ELLIPTA 100-25 MCG/INH AEPB Inhale 1 puff into the lungs daily. 09/30/20   Jennye Boroughs, MD  diltiazem (CARDIZEM) 120 MG tablet Take 1 tablet (120 mg total) by mouth 2 (two) times daily. 09/30/20   Jennye Boroughs, MD  docusate sodium (COLACE) 100 MG capsule Take 100 mg by mouth daily as needed for mild constipation.    [provider]  hydrochlorothiazide (HYDRODIURIL) 25 MG tablet Take 25 mg by mouth daily.    [provider]  ipratropium-albuterol (DUONEB) 0.5-2.5 (3) MG/3ML SOLN Take 3 mLs by nebulization every 6 (six) hours as needed. 09/30/20   Jennye Boroughs, MD  montelukast (SINGULAIR) 10 MG tablet Take 1 tablet (10 mg total) by mouth at bedtime. 09/30/20   Jennye Boroughs, MD  nicotine (NICODERM CQ - DOSED IN MG/24 HOURS) 21 mg/24hr patch Place 21 mg onto the skin daily.    [provider]  pantoprazole (PROTONIX) 20 MG tablet Take 1 tablet (20 mg total) by mouth daily. 09/30/20   Jennye Boroughs, MD  tiotropium (SPIRIVA) 18 MCG inhalation capsule Place 18 mcg into inhaler and inhale daily.    [provider]    Physical Exam: Vitals:   10/18/20 2030 10/18/20 2100 10/18/20 2130 10/18/20 2235  BP: 118/63 116/62 123/63 (!) 112/44  Pulse: 78 81 80 83  Resp: (!) 23 (!) 26 (!) 23 (!) 24  Temp:      TempSrc:      SpO2: 98% 93% 94% 95%  Weight:      Height:       Physical  Exam Vitals and nursing note reviewed.  Constitutional:      General: He is not in acute distress.    Appearance: He is ill-appearing.  HENT:     Head: Normocephalic and atraumatic.     Right Ear: External ear normal.     Left Ear: External ear normal.     Nose: Nose normal.     Mouth/Throat:     Mouth: Mucous membranes are moist.  Eyes:     Extraocular Movements: Extraocular movements intact.     Pupils: Pupils are equal, round, and reactive to light.  Neck:     Vascular: No carotid bruit.  Cardiovascular:     Rate and Rhythm: Normal rate  and regular rhythm.     Pulses: Normal pulses.     Heart sounds: Normal heart sounds.  Pulmonary:     Effort: Pulmonary effort is normal. Tachypnea present.     Breath sounds: Examination of the right-middle field reveals wheezing. Examination of the left-middle field reveals wheezing. Examination of the right-lower field reveals wheezing. Examination of the left-lower field reveals wheezing. Wheezing present.  Abdominal:     General: Bowel sounds are normal. There is no distension.     Palpations: Abdomen is soft.     Tenderness: There is no abdominal tenderness. There is no guarding.  Musculoskeletal:     Right lower leg: No edema.     Left lower leg: No edema.  Skin:    General: Skin is warm.  Neurological:     General: No focal deficit present.     Mental Status: He is alert and oriented to person, place, and time.     Cranial Nerves: No cranial nerve deficit.     Motor: No weakness.  Psychiatric:        Mood and Affect: Mood normal.        Behavior: Behavior normal.     Labs on Admission: I have personally reviewed following labs and imaging studies  No results for input(s): CKTOTAL, CKMB, TROPONINI in the last 72 hours. Lab Results  Component Value Date   WBC 8.9 10/18/2020   HGB 11.7 (L) 10/18/2020   HCT 33.5 (L) 10/18/2020   MCV 83.1 10/18/2020   PLT 277 10/18/2020    Recent Labs  Lab 10/18/20 1930  NA 118*  K 3.7   CL 87*  CO2 23  BUN 10  CREATININE 0.48*  CALCIUM 8.3*  GLUCOSE 116*   No results found for: CHOL, HDL, LDLCALC, TRIG Lab Results  Component Value Date   DDIMER 1.40 (H) 05/18/2020   Invalid input(s): POCBNP  Urinalysis    Component Value Date/Time   COLORURINE YELLOW (A) 06/10/2020 0220   APPEARANCEUR CLEAR (A) 06/10/2020 0220   APPEARANCEUR Clear 09/12/2013 0920   LABSPEC 1.012 06/10/2020 0220   LABSPEC 1.010 09/12/2013 0920   PHURINE 5.0 06/10/2020 0220   GLUCOSEU NEGATIVE 06/10/2020 0220   GLUCOSEU Negative 09/12/2013 0920   HGBUR NEGATIVE 06/10/2020 0220   BILIRUBINUR NEGATIVE 06/10/2020 0220   BILIRUBINUR Negative 09/12/2013 0920   KETONESUR NEGATIVE 06/10/2020 0220   PROTEINUR NEGATIVE 06/10/2020 0220   NITRITE NEGATIVE 06/10/2020 0220   LEUKOCYTESUR NEGATIVE 06/10/2020 0220   LEUKOCYTESUR Negative 09/12/2013 0920  COVID-19 Labs No results for input(s): DDIMER, FERRITIN, LDH, CRP in the last 72 hours.  Lab Results  Component Value Date   SARSCOV2NAA NEGATIVE 10/18/2020   SARSCOV2NAA NEGATIVE 09/26/2020   [place] NEGATIVE 06/09/2020   SARSCOV2NAA POSITIVE (A) 05/14/2020    Radiological Exams on Admission: CT Angio Chest Pulmonary Embolism (PE) W or WO Contrast  Result Date: 10/18/2020 CLINICAL DATA:  Respiratory failure. Query cancer versus pulmonary embolus. Shortness of breath. EXAM: CT ANGIOGRAPHY CHEST WITH CONTRAST TECHNIQUE: Multidetector CT imaging of the chest was performed using the standard protocol during bolus administration of intravenous contrast. Multiplanar CT image reconstructions and MIPs were obtained to evaluate the vascular anatomy. CONTRAST:  56mL OMNIPAQUE IOHEXOL 350 MG/ML SOLN COMPARISON:  08/04/2015 FINDINGS: Cardiovascular: Satisfactory opacification of the pulmonary arteries to the segmental level. No evidence of pulmonary embolism. Normal heart size. No pericardial effusion. Normal caliber thoracic aorta. No aortic dissection.  Coronary artery and aortic calcifications. Mediastinum/Nodes: Moderately prominent pretracheal and subcarinal  lymph nodes as well as right hilar lymph nodes. Largest nodes measure about 1.8 cm diameter. Lymph nodes are increased in size since prior study. Esophagus is decompressed. Lungs/Pleura: Severe diffuse emphysematous changes and scattered fibrosis in the lungs. Suggestion of superimposed consolidation in the right middle lung, possibly pneumonia although an endobronchial lesion with postobstructive change could have this appearance as well. Upper Abdomen: No acute abnormalities demonstrated in the visualized upper abdomen. Musculoskeletal: Degenerative changes in the spine. No destructive bone lesions identified. Motion artifact limits examination. Review of the MIP images confirms the above findings. IMPRESSION: 1. No evidence of significant pulmonary embolus. 2. Diffuse emphysematous changes throughout the lungs. 3. Focal consolidation in the right middle lung likely represents pneumonia but follow-up after resolution of acute process is recommended to exclude centrally obstructing lesion. 4. Mild but increasing lymphadenopathy in the mediastinum and right hilum since prior study. Electronically Signed   By: Lucienne Capers M.D.   On: 10/18/2020 22:10   DG Chest Port 1 View  Result Date: 10/18/2020 CLINICAL DATA:  Shortness of breath EXAM: PORTABLE CHEST 1 VIEW COMPARISON:  09/26/2020 FINDINGS: There is hyperinflation of the lungs compatible with COPD. Diffuse interstitial prominence throughout the lungs compatible with chronic lung disease/fibrosis. Increasing opacity at the right lung base could reflect superimposed infiltrate. Heart is normal size. No effusions or acute bony abnormality. IMPRESSION: COPD/changes of fibrosis. Increasing opacity at the right lung base again noted concerning for superimposed infiltrate. Electronically Signed   By: Rolm Baptise M.D.   On: 10/18/2020 19:47    EKG:  Independently reviewed.  Normal sinus rhythm at 84 with normal axis and ST-T wave changes.   Echocardiogram 06/11/2020: 1. Left ventricular ejection fraction, by estimation, is 60 to 65%. The left ventricle has normal function. The left ventricle has no regional wall motion abnormalities. Left ventricular diastolic parameters are indeterminate. 2. Right ventricular systolic function is normal. The right ventricular size is normal. 3. Challenging images.   Assessment/Plan Principal Problem:   Acute on chronic respiratory failure with hypoxia (HCC) Active Problems:   Hyponatremia   Tobacco dependency   HTN (hypertension)   Abnormal LFTs   Anemia   Acute and chronic respiratory failure (acute-on-chronic) (HCC) Acute on C/H RF with hypoxia: Attribute to pneumonia and we will admit to progressive unit for BIPAP therapy to be continued.  Cont iv abx with levaquin. CT imaging to identify for any PE or Cancer due to worsening resp failure and hyponatremia.  Cont with steroids / duonebs / MDI.  Hyponatremia: Attribute to HCTZ, ? SIADH we will get thyroid function and chest imaging to start with.   Tobacco abuse: Counseled pt and nicotine patch.   HTN: Blood pressure (!) 112/44, pulse 83, temperature 97.8 F (36.6 C), temperature source Oral, resp. rate (!) 24, height 6' (1.829 m), weight 72.1 kg, SpO2 95 %. Cont home regimen and held hctz due to low bp and hyponatremia.   Abnormal lft: Will obtain hepatic function panel and follow.   Anemia: Anemia is chronic since  2017. We wil obtain anemia panel and iv ppi. Type and screen and follow.         DVT prophylaxis:  SCDs  Code Status:  Full code  Family Communication:  Martin,Annette (Friend)  [phone] (Mobile)   Disposition Plan:  Home  Consults called:  None  Admission status: Inpatient   Para Skeans MD [facility] [phone] How to contact the [facility] Attending or Consulting provider [place]  or  covering provider during after hours [employer], for this patient.    Check the care team in [facility] and look for a) attending/consulting [facility] provider listed and b) the [facility] team listed Log into [email or web address] and use [place]'s universal password to access. If you do not have the password, please contact the hospital operator. Locate the [facility] provider you are looking for under [facility] and page to a number that you can be directly reached. If you still have difficulty reaching the provider, please page the [facility] (Director on Call) for the Hospitalists listed on amion for assistance. [email or web address] Password [facility] 10/18/2020, 10:58 PM

## 2020-10-18 NOTE — ED Notes (Signed)
Patient placed on non-rebreather and transported to CT at this time. Tolerating non-rebreather well.

## 2020-10-18 NOTE — ED Notes (Signed)
Pts oxygen on 6L NS is not rising over 82%. Pt placed back on non-re breather at 15L with oxygen reading at 93%. MD made aware.

## 2020-10-18 NOTE — ED Triage Notes (Signed)
Pt coming in via EMS from home with a cc of shortness of breath since this morning. Sats 71% on 2L at home upon MEDIC arrival with distress noted. Patient given 2 duo nebs, 1 albulterol tx, and 125 of solumedrol in route. Patient labored and tachypnic upon arrival with audible wheezing. Sats 69% on 4L. Placed on non-rebreather.

## 2020-10-18 NOTE — ED Notes (Signed)
X-ray at bedside

## 2020-10-18 NOTE — ED Provider Notes (Addendum)
[facility] Emergency Department Provider Note  Time seen: 7:41 PM  I have reviewed the triage vital signs and the nursing notes.   HISTORY  Chief Complaint Shortness of Breath   HPI Jeremiah [name] is a 71 y.o. male with a past medical history of COPD, hypertension, presents to the emergency department for shortness of breath.  According to the patient since this morning he has been feeling very short of breath.  Patient wears 2 L of oxygen 24/7.  EMS states patient satting 72% on 2 L.  Here the patient is satting 84% on 4 L increased to 6 L and the patient is satting 92 to 93%.  Patient denies any increased cough over baseline.  Denies any known fever.  Does state lower extremity swelling but states this is chronic.  Patient received 125 mg of Solu-Medrol and 2 DuoNeb's prior to arrival.   Past Medical History:  Diagnosis Date   Asthma    COPD (chronic obstructive pulmonary disease) ([place])    O2 dependent - 2L   GERD (gastroesophageal reflux disease)    HTN (hypertension)    Oxygen dependent    Tobacco dependence     Patient Active Problem List   Diagnosis Date Noted   Multifocal pneumonia 06/16/2020   Cellulitis 05/15/2020   COPD with chronic bronchitis ([place]) 05/14/2020   Nicotine dependence 05/14/2020   Chronic respiratory failure with hypoxia ([place]) 05/14/2020   Cellulitis of right foot 05/14/2020   Hyponatremia 05/14/2020   Sepsis ([place]) 05/14/2020   HTN (hypertension) 05/14/2020   Abnormal LFTs 05/14/2020   Acute on chronic respiratory failure with hypoxia ([place]) 10/15/2016   Acute bronchitis 10/15/2016   Tobacco dependency 10/15/2016   COPD with acute exacerbation ([place]) 07/28/2015    Past Surgical History:  Procedure Laterality Date   CATARACT EXTRACTION W/PHACO Left 12/09/2019   Procedure: CATARACT EXTRACTION PHACO AND INTRAOCULAR LENS PLACEMENT (IOC) COMPLICATED LEFT 95.28 41:32.4;  Surgeon: Birder Robson, MD;  Location: [facility];  Service: Ophthalmology;  Laterality: Left;  MILOOP VISION BLUE   CATARACT EXTRACTION W/PHACO Right 01/27/2020   Procedure: CATARACT EXTRACTION PHACO AND INTRAOCULAR LENS PLACEMENT ([place]) RIGHT VISION BLUE 26.83 02:07.6;  Surgeon: Birder Robson, MD;  Location: [facility];  Service: Ophthalmology;  Laterality: Right;   HERNIA REPAIR     NECK SURGERY      Prior to Admission medications   Medication Sig Start Date End Date Taking? Authorizing Provider  acetaminophen (TYLENOL) 325 MG tablet Take 2 tablets (650 mg total) by mouth every 6 (six) hours as needed for mild pain (or Fever >/= 101). 08/06/15   Dustin Flock, MD  albuterol (VENTOLIN HFA) 108 (90 Base) MCG/ACT inhaler Inhale into the lungs every 6 (six) hours as needed for wheezing or shortness of breath.    [provider]  atorvastatin (LIPITOR) 40 MG tablet Take 40 mg by mouth every evening.    [provider]  BREO ELLIPTA 100-25 MCG/INH AEPB Inhale 1 puff into the lungs daily. 09/30/20   Jennye Boroughs, MD  diltiazem (CARDIZEM) 120 MG tablet Take 1 tablet (120 mg total) by mouth 2 (two) times daily. 09/30/20   Jennye Boroughs, MD  docusate sodium (COLACE) 100 MG capsule Take 100 mg by mouth daily as needed for mild constipation.    [provider]  hydrochlorothiazide (HYDRODIURIL) 25 MG tablet Take 25 mg by mouth daily.    [provider]  ipratropium-albuterol (DUONEB) 0.5-2.5 (3) MG/3ML SOLN Take 3  mLs by nebulization every 6 (six) hours as needed. 09/30/20   Jennye Boroughs, MD  montelukast (SINGULAIR) 10 MG tablet Take 1 tablet (10 mg total) by mouth at bedtime. 09/30/20   Jennye Boroughs, MD  nicotine (NICODERM CQ - DOSED IN MG/24 HOURS) 21 mg/24hr patch Place 21 mg onto the skin daily.    [provider]  pantoprazole (PROTONIX) 20 MG tablet Take 1 tablet (20 mg total) by mouth daily. 09/30/20   Jennye Boroughs, MD  tiotropium (SPIRIVA) 18 MCG inhalation capsule Place 18  mcg into inhaler and inhale daily.    [provider]    Allergies  Allergen Reactions   Penicillins Anaphylaxis, Swelling and Other (See Comments)    Tolerated Ceftriaxone 06/2020 05/2020 childhood reaction and his mother told him that he "swelled up."  Had a PCN reaction causing immediate rash, facial/tongue/throat swelling, SOB or lightheadedness with hypotension: Yes Had a PCN reaction causing severe rash involving mucus membranes or skin necrosis: No Had a PCN reaction that required hospitalization No Had a PCN reaction occurring within the last 10 years: No If all the above answers are "NO", may proceed with Cephalosporin use.    Family History  Problem Relation Age of Onset   Cancer - Colon Father    Congestive Heart Failure Mother     Social History Social History   Tobacco Use   Smoking status: Every Day    Packs/day: 0.50    Years: 50.00    Pack years: 25.00    Types: Cigarettes    Last attempt to quit: 07/28/2015    Years since quitting: 5.2   Smokeless tobacco: Never   Tobacco comments:    started age 68.  Vaping Use   Vaping Use: Never used  Substance Use Topics   Alcohol use: Yes    Alcohol/week: 0.0 standard drinks    Comment: beers occasionally   Drug use: No    Review of Systems Constitutional: Negative for fever. Cardiovascular: Negative for chest pain. Respiratory: Positive for shortness of breath. Gastrointestinal: Negative for abdominal pain, vomiting  Genitourinary: Negative for urinary compaints Musculoskeletal: Negative for musculoskeletal complaints Neurological: Negative for headache All other ROS negative  ____________________________________________   PHYSICAL EXAM:  VITAL SIGNS: ED Triage Vitals  Enc Vitals Group     BP 10/18/20 1913 127/65     Pulse Rate 10/18/20 1913 88     Resp 10/18/20 1913 (!) 24     Temp 10/18/20 1913 97.8 F (36.6 C)     Temp Source 10/18/20 1913 Oral     SpO2 10/18/20 1913 98 %     Weight  10/18/20 1918 159 lb (72.1 kg)     Height 10/18/20 1918 6' (1.829 m)     Head Circumference --      Peak Flow --      Pain Score 10/18/20 1916 5     Pain Loc --      Pain Edu? --      Excl. in [place]? --    Constitutional: Alert and oriented. Well appearing and in no distress. Eyes: Normal exam ENT      Head: Normocephalic and atraumatic.      Mouth/Throat: Mucous membranes are moist. Cardiovascular: Normal rate, regular rhythm.  Respiratory: Moderate tachypnea around 25 to 30 breaths/min.  Moderate expiratory wheeze with decreased air movement bilaterally. Gastrointestinal: Soft and nontender. No distention.   Musculoskeletal: Nontender with normal range of motion in all extremities.  1-2+ lower extremity edema bilaterally.  Neurologic:  Normal speech and language. No gross focal neurologic deficits  Skin:  Skin is warm, dry and intact.  Psychiatric: Mood and affect are normal.   ____________________________________________    EKG  EKG viewed and interpreted by myself shows a normal sinus rhythm 84 bpm with a narrow QRS, normal axis, normal intervals, no concerning ST changes.  ____________________________________________    RADIOLOGY  X-ray consistent with COPD with right lower lung opacity concerning for pneumonia.  ____________________________________________   INITIAL IMPRESSION / ASSESSMENT AND PLAN / ED COURSE  Pertinent labs & imaging results that were available during my care of the patient were reviewed by me and considered in my medical decision making (see chart for details).   Patient presents emergency department for shortness of breath.  Patient has audible wheezes bilaterally with decreased air movement currently satting 85% on 4 L.  Patient now satting in the 80s on 6 L nasal cannula.  We will place the patient on BiPAP.  Patient's x-ray is concerning for possible pneumonia.  Patient's labs show significant hyponatremia 118.  We will start the patient on  normal saline infusion, send blood cultures start on antibiotics and admit to the hospitalist service.  Jeremiah [name] was evaluated in Emergency Department on 10/18/2020 for the symptoms described in the history of present illness. He was evaluated in the context of the global COVID-19 pandemic, which necessitated consideration that the patient might be at risk for infection with the SARS-CoV-2 virus that causes COVID-19. Institutional protocols and algorithms that pertain to the evaluation of patients at risk for COVID-19 are in a state of rapid change based on information released by regulatory bodies including the CDC and federal and state organizations. These policies and algorithms were followed during the patient's care in the ED.  CRITICAL CARE Performed by: Harvest Dark   Total critical care time: 30 minutes  Critical care time was exclusive of separately billable procedures and treating other patients.  Critical care was necessary to treat or prevent imminent or life-threatening deterioration.  Critical care was time spent personally by me on the following activities: development of treatment plan with patient and/or surrogate as well as nursing, discussions with consultants, evaluation of patient's response to treatment, examination of patient, obtaining history from patient or surrogate, ordering and performing treatments and interventions, ordering and review of laboratory studies, ordering and review of radiographic studies, pulse oximetry and re-evaluation of patient's condition.  ____________________________________________   FINAL CLINICAL IMPRESSION(S) / ED DIAGNOSES  Pneumonia Hypoxia Hyponatremia   Harvest Dark, MD 10/18/20 2041    Harvest Dark, MD 10/18/20 2110

## 2020-10-18 NOTE — ED Notes (Signed)
Patient remains in 6L via [place]. Tolerating well. No respiratory distress or increased work of breathing noted.

## 2020-10-18 NOTE — ED Notes (Signed)
Patient sats dropped to 85% on 4L increased to 6L per provider at bedside and plan to observe. Sats 91%. Breathing labored. Patient able to speak in complete senten es.

## 2020-10-18 NOTE — ED Notes (Signed)
ED Provider at bedside. 

## 2020-10-19 ENCOUNTER — Other Ambulatory Visit: Payer: Self-pay

## 2020-10-19 DIAGNOSIS — J441 Chronic obstructive pulmonary disease with (acute) exacerbation: Principal | ICD-10-CM

## 2020-10-19 LAB — CBC
HCT: 33.3 % — ABNORMAL LOW (ref 39.0–52.0)
Hemoglobin: 11.8 g/dL — ABNORMAL LOW (ref 13.0–17.0)
MCH: 29.1 pg (ref 26.0–34.0)
MCHC: 35.4 g/dL (ref 30.0–36.0)
MCV: 82 fL (ref 80.0–100.0)
Platelets: 287 10*3/uL (ref 150–400)
RBC: 4.06 MIL/uL — ABNORMAL LOW (ref 4.22–5.81)
RDW: 14.7 % (ref 11.5–15.5)
WBC: 3.5 10*3/uL — ABNORMAL LOW (ref 4.0–10.5)
nRBC: 0 % (ref 0.0–0.2)

## 2020-10-19 LAB — BASIC METABOLIC PANEL
Anion gap: 6 (ref 5–15)
BUN: 17 mg/dL (ref 8–23)
CO2: 27 mmol/L (ref 22–32)
Calcium: 8.5 mg/dL — ABNORMAL LOW (ref 8.9–10.3)
Chloride: 95 mmol/L — ABNORMAL LOW (ref 98–111)
Creatinine, Ser: 0.72 mg/dL (ref 0.61–1.24)
GFR, Estimated: >60 mL/min (ref >60)
Glucose, Bld: 210 mg/dL — ABNORMAL HIGH (ref 70–99)
Potassium: 3.5 mmol/L (ref 3.5–5.1)
Sodium: 128 mmol/L — ABNORMAL LOW (ref 135–145)

## 2020-10-19 LAB — COMPREHENSIVE METABOLIC PANEL
ALT: 14 U/L (ref 0–44)
AST: 24 U/L (ref 15–41)
Albumin: 3.2 g/dL — ABNORMAL LOW (ref 3.5–5.0)
Alkaline Phosphatase: 129 U/L — ABNORMAL HIGH (ref 38–126)
Anion gap: 9 (ref 5–15)
BUN: 11 mg/dL (ref 8–23)
CO2: 24 mmol/L (ref 22–32)
Calcium: 8.6 mg/dL — ABNORMAL LOW (ref 8.9–10.3)
Chloride: 90 mmol/L — ABNORMAL LOW (ref 98–111)
Creatinine, Ser: 0.6 mg/dL — ABNORMAL LOW (ref 0.61–1.24)
GFR, Estimated: >60 mL/min (ref >60)
Glucose, Bld: 170 mg/dL — ABNORMAL HIGH (ref 70–99)
Potassium: 4.4 mmol/L (ref 3.5–5.1)
Sodium: 123 mmol/L — ABNORMAL LOW (ref 135–145)
Total Bilirubin: 0.6 mg/dL (ref 0.3–1.2)
Total Protein: 6.6 g/dL (ref 6.5–8.1)

## 2020-10-19 LAB — RETICULOCYTES
Immature Retic Fract: 4 % (ref 2.3–15.9)
RBC.: 3.92 MIL/uL — ABNORMAL LOW (ref 4.22–5.81)
Retic Count, Absolute: 69.8 10*3/uL (ref 19.0–186.0)
Retic Ct Pct: 1.8 % (ref 0.4–3.1)

## 2020-10-19 LAB — IRON AND TIBC
Iron: 52 ug/dL (ref 45–182)
Saturation Ratios: 14 % — ABNORMAL LOW (ref 17.9–39.5)
TIBC: 363 ug/dL (ref 250–450)
UIBC: 311 ug/dL

## 2020-10-19 LAB — FOLATE: Folate: 13.2 ng/mL (ref >5.9)

## 2020-10-19 LAB — FERRITIN: Ferritin: 33 ng/mL (ref 24–336)

## 2020-10-19 LAB — TSH: TSH: 0.774 u[IU]/mL (ref 0.350–4.500)

## 2020-10-19 LAB — VITAMIN B12: Vitamin B-12: 629 pg/mL (ref 180–914)

## 2020-10-19 LAB — TYPE AND SCREEN
ABO/RH(D): O POS
Antibody Screen: NEGATIVE

## 2020-10-19 LAB — T4, FREE: Free T4: 1.35 ng/dL — ABNORMAL HIGH (ref 0.61–1.12)

## 2020-10-19 MED ORDER — TRAMADOL HCL 50 MG PO TABS
50.0000 mg | ORAL_TABLET | Freq: Four times a day (QID) | ORAL | Status: AC | PRN
Start: 2020-10-19 — End: 2020-10-20
  Administered 2020-10-19 – 2020-10-20 (×2): 50 mg via ORAL
  Filled 2020-10-19 (×2): qty 1

## 2020-10-19 MED ORDER — NICOTINE POLACRILEX 2 MG MT GUM
2.0000 mg | CHEWING_GUM | OROMUCOSAL | Status: DC | PRN
  Filled 2020-10-19 (×2): qty 1

## 2020-10-19 MED ORDER — SODIUM CHLORIDE 0.9% FLUSH
10.0000 mL | Freq: Two times a day (BID) | INTRAVENOUS | Status: DC
  Administered 2020-10-19 – 2020-10-23 (×8): 10 mL via INTRAVENOUS

## 2020-10-19 MED ORDER — ALBUTEROL SULFATE (2.5 MG/3ML) 0.083% IN NEBU
2.5000 mg | INHALATION_SOLUTION | Freq: Four times a day (QID) | RESPIRATORY_TRACT | Status: DC | PRN
  Administered 2020-10-22: 2.5 mg via RESPIRATORY_TRACT
  Filled 2020-10-19 (×2): qty 3

## 2020-10-19 MED ORDER — SODIUM CHLORIDE 0.9 % IV SOLN: INTRAVENOUS | Status: DC

## 2020-10-19 NOTE — Progress Notes (Signed)
PROGRESS NOTE  Jeremiah [name] YFV:[id number] DOB: 12/27/1950 DOA: 10/18/2020 PCP: [facility]   LOS: 1 day   Brief Narrative / Interim history: 70 year old male with history of COPD on 2 L nasal cannula at home, ongoing tobacco use, hypertension, comes to the hospital with complaints of shortness of breath.  Patient tells me his symptoms have been rather sudden and has noticed shortness of breath along with significant wheezing unrelieved by home treatment and decided to call EMS.  He was hypoxic with sats in the 70s requiring nonrebreather and BiPAP in the ED.  Chest x-ray shows evidence of pneumonia and he was admitted to the hospital.  Subjective / 24h Interval events: Is doing a little bit better this morning, feels like his breathing is better.  No longer on BiPAP and using 6 L.  No chest pain, no abdominal pain, does have shortness of breath but that improved  Assessment & Plan: Principal Problem Acute on chronic hypoxic respiratory failure due to COPD exacerbation in the setting of lobar pneumonia in the right middle lung -continue supportive care with Levaquin, steroids, nebulizers -Seems to be improving, currently on 6 L, wean off to home 2 L as tolerated  Active Problems Hyponatremia-in the setting of poor p.o. intake at home prior to admission as well as HCTZ use.  He is responding to IV fluids, sodium improved from 118-123 this morning, continue IV fluids and recheck sodium tonight and tomorrow morning  Tobacco use-nicotine patch, he was counseled for cessation  Hyperlipidemia-continue statin  Essential hypertension-continue home diltiazem, blood pressure controlled  Lower extremity swelling-chronic, per patient worsening during the day and improving upon laying flat.  Most recent 2D echo in February 2022 showed EF 16-38% with diastolic parameters indeterminate.  RV was normal.   -Likely due to chronic venous insufficiency.  Put TED hoses  Scheduled  Meds:  atorvastatin  40 mg Oral QPM   diltiazem  120 mg Oral BID   fluticasone furoate-vilanterol  1 puff Inhalation Daily   heparin  5,000 Units Subcutaneous Q8H   ipratropium-albuterol  3 mL Nebulization Q6H   methylPREDNISolone (SOLU-MEDROL) injection  80 mg Intravenous Q12H   montelukast  10 mg Oral QHS   nicotine  21 mg Transdermal Daily   pantoprazole  20 mg Oral Daily   tiotropium  18 mcg Inhalation Daily   Continuous Infusions:  levofloxacin (LEVAQUIN) IV     PRN Meds:.acetaminophen, albuterol, nicotine polacrilex, ondansetron **OR** ondansetron (ZOFRAN) IV  Diet Orders (From admission, onward)     Start     Ordered   10/19/20 0658  Diet regular Room service appropriate? Yes; Fluid consistency: Thin  Diet effective now       Question Answer Comment  Room service appropriate? Yes   Fluid consistency: Thin      10/19/20 0657            DVT prophylaxis: heparin injection 5,000 Units Start: 10/18/20 2300 SCDs Start: 10/18/20 2248     Code Status: Full Code  Family Communication: no family at bedside   Status is: Inpatient  Remains inpatient appropriate because:Inpatient level of care appropriate due to severity of illness  Dispo: The patient is from: Home              Anticipated d/c is to: Home              Patient currently is not medically stable to d/c.   Difficult to place patient No   Level of  care: Progressive Cardiac  Consultants:  None   Procedures:  None   Microbiology  Blood culture 6/13-no growth SARS-CoV-2-negative 6/13  Antimicrobials: Levaquin 6/13 >>   Objective: Vitals:   10/19/20 0700 10/19/20 0730 10/19/20 0743 10/19/20 0830  BP: 132/78 131/78  140/77  Pulse: 95 95  99  Resp: (!) 21 20  (!) 28  Temp:      TempSrc:      SpO2: 94% 93%  91%  Weight:   72 kg   Height:   6' (1.829 m)     Intake/Output Summary (Last 24 hours) at 10/19/2020 0922 Last data filed at 10/19/2020 0424 Gross per 24 hour  Intake 1150 ml   Output 1375 ml  Net -225 ml   Filed Weights   10/18/20 1918 10/19/20 0743  Weight: 72.1 kg 72 kg    Examination:  Constitutional: NAD, tachypneic at times Eyes: no scleral icterus ENMT: Mucous membranes are dry.  Neck: normal, supple Respiratory: Distant breath sounds but no active wheezing heard.  Increased respiratory effort, tachypneic Cardiovascular: Regular rate and rhythm, no murmurs / rubs / gallops.  1+ lower extremity edema Abdomen: non distended, no tenderness. Bowel sounds positive.  Musculoskeletal: no clubbing / cyanosis.  Skin: no rashes Neurologic: CN 2-12 grossly intact. Strength 5/5 in all 4.   Data Reviewed: I have independently reviewed following labs and imaging studies   CBC: Recent Labs  Lab 10/18/20 1930 10/19/20 0445  WBC 8.9 3.5*  HGB 11.7* 11.8*  HCT 33.5* 33.3*  MCV 83.1 82.0  PLT 277 211   Basic Metabolic Panel: Recent Labs  Lab 10/18/20 1930 10/19/20 0445  NA 118* 123*  K 3.7 4.4  CL 87* 90*  CO2 23 24  GLUCOSE 116* 170*  BUN 10 11  CREATININE 0.48* 0.60*  CALCIUM 8.3* 8.6*   Liver Function Tests: Recent Labs  Lab 10/18/20 1930 10/19/20 0445  AST 22 24  ALT 15 14  ALKPHOS 134* 129*  BILITOT 0.8 0.6  PROT 6.9 6.6  ALBUMIN 3.5 3.2*   Coagulation Profile: No results for input(s): INR, PROTIME in the last 168 hours. HbA1C: No results for input(s): HGBA1C in the last 72 hours. CBG: No results for input(s): GLUCAP in the last 168 hours.  Recent Results (from the past 240 hour(s))  Resp Panel by RT-PCR (Flu A&B, Covid) Nasopharyngeal Swab     Status: None   Collection Time: 10/18/20  7:30 PM   Specimen: Nasopharyngeal Swab; Nasopharyngeal(NP) swabs in vial transport medium  Result Value Ref Range Status   SARS Coronavirus 2 by RT PCR NEGATIVE NEGATIVE Final    Comment: (NOTE) SARS-CoV-2 target nucleic acids are NOT DETECTED.  The SARS-CoV-2 RNA is generally detectable in upper respiratory specimens during the acute  phase of infection. The lowest concentration of SARS-CoV-2 viral copies this assay can detect is 138 copies/mL. A negative result does not preclude SARS-Cov-2 infection and should not be used as the sole basis for treatment or other patient management decisions. A negative result may occur with  improper specimen collection/handling, submission of specimen other than nasopharyngeal swab, presence of viral mutation(s) within the areas targeted by this assay, and inadequate number of viral copies(<138 copies/mL). A negative result must be combined with clinical observations, patient history, and epidemiological information. The expected result is Negative.  Fact Sheet for Patients:  EntrepreneurPulse.com.au  Fact Sheet for Healthcare Providers:  IncredibleEmployment.be  This test is no t yet approved or cleared by the [place] FDA  and  has been authorized for detection and/or diagnosis of SARS-CoV-2 by FDA under an Emergency Use Authorization (EUA). This EUA will remain  in effect (meaning this test can be used) for the duration of the COVID-19 declaration under Section 564(b)(1) of the Act, 21 U.S.C.section 360bbb-3(b)(1), unless the authorization is terminated  or revoked sooner.       Influenza A by PCR NEGATIVE NEGATIVE Final   Influenza B by PCR NEGATIVE NEGATIVE Final    Comment: (NOTE) The Xpert Xpress SARS-CoV-2/FLU/RSV plus assay is intended as an aid in the diagnosis of influenza from Nasopharyngeal swab specimens and should not be used as a sole basis for treatment. Nasal washings and aspirates are unacceptable for Xpert Xpress SARS-CoV-2/FLU/RSV testing.  Fact Sheet for Patients: EntrepreneurPulse.com.au  Fact Sheet for Healthcare Providers: IncredibleEmployment.be  This test is not yet approved or cleared by the [place] FDA and has been authorized for detection and/or diagnosis of  SARS-CoV-2 by FDA under an Emergency Use Authorization (EUA). This EUA will remain in effect (meaning this test can be used) for the duration of the COVID-19 declaration under Section 564(b)(1) of the Act, 21 U.S.C. section 360bbb-3(b)(1), unless the authorization is terminated or revoked.  Performed at [facility], [address] [place], [place] [address]   Blood culture (routine x 2)     Status: None (Preliminary result)   Collection Time: 10/18/20  8:43 PM   Specimen: BLOOD  Result Value Ref Range Status   Specimen Description BLOOD BLOOD RIGHT WRIST  Final   Special Requests   Final    BOTTLES DRAWN AEROBIC AND ANAEROBIC Blood Culture adequate volume   Culture   Final    NO GROWTH < 12 HOURS Performed at [facility], [address], [place], [place] [address]    Report Status PENDING  Incomplete  Blood culture (routine x 2)     Status: None (Preliminary result)   Collection Time: 10/18/20  9:01 PM   Specimen: BLOOD  Result Value Ref Range Status   Specimen Description BLOOD LEFT ANTECUBITAL  Final   Special Requests   Final    BOTTLES DRAWN AEROBIC AND ANAEROBIC Blood Culture adequate volume   Culture   Final    NO GROWTH < 12 HOURS Performed at [facility], [address] [place], [place] [address]    Report Status PENDING  Incomplete     Radiology Studies: CT Angio Chest Pulmonary Embolism (PE) W or WO Contrast  Result Date: 10/18/2020 CLINICAL DATA:  Respiratory failure. Query cancer versus pulmonary embolus. Shortness of breath. EXAM: CT ANGIOGRAPHY CHEST WITH CONTRAST TECHNIQUE: Multidetector CT imaging of the chest was performed using the standard protocol during bolus administration of intravenous contrast. Multiplanar CT image reconstructions and MIPs were obtained to evaluate the vascular anatomy. CONTRAST:  83mL OMNIPAQUE IOHEXOL 350 MG/ML SOLN COMPARISON:  08/04/2015 FINDINGS: Cardiovascular: Satisfactory opacification of the  pulmonary arteries to the segmental level. No evidence of pulmonary embolism. Normal heart size. No pericardial effusion. Normal caliber thoracic aorta. No aortic dissection. Coronary artery and aortic calcifications. Mediastinum/Nodes: Moderately prominent pretracheal and subcarinal lymph nodes as well as right hilar lymph nodes. Largest nodes measure about 1.8 cm diameter. Lymph nodes are increased in size since prior study. Esophagus is decompressed. Lungs/Pleura: Severe diffuse emphysematous changes and scattered fibrosis in the lungs. Suggestion of superimposed consolidation in the right middle lung, possibly pneumonia although an endobronchial lesion with postobstructive change could have this appearance as well. Upper Abdomen:  No acute abnormalities demonstrated in the visualized upper abdomen. Musculoskeletal: Degenerative changes in the spine. No destructive bone lesions identified. Motion artifact limits examination. Review of the MIP images confirms the above findings. IMPRESSION: 1. No evidence of significant pulmonary embolus. 2. Diffuse emphysematous changes throughout the lungs. 3. Focal consolidation in the right middle lung likely represents pneumonia but follow-up after resolution of acute process is recommended to exclude centrally obstructing lesion. 4. Mild but increasing lymphadenopathy in the mediastinum and right hilum since prior study. Electronically Signed   By: Lucienne Capers M.D.   On: 10/18/2020 22:10   DG Chest Port 1 View  Result Date: 10/18/2020 CLINICAL DATA:  Shortness of breath EXAM: PORTABLE CHEST 1 VIEW COMPARISON:  09/26/2020 FINDINGS: There is hyperinflation of the lungs compatible with COPD. Diffuse interstitial prominence throughout the lungs compatible with chronic lung disease/fibrosis. Increasing opacity at the right lung base could reflect superimposed infiltrate. Heart is normal size. No effusions or acute bony abnormality. IMPRESSION: COPD/changes of fibrosis.  Increasing opacity at the right lung base again noted concerning for superimposed infiltrate. Electronically Signed   By: Rolm Baptise M.D.   On: 10/18/2020 19:47    Marzetta Board, MD, PhD [facility]  Between 7 am - 7 pm I am available, please contact me via Amion (for emergencies) or Securechat (non urgent messages)  Between 7 pm - 7 am I am not available, please contact night coverage MD/APP via Amion

## 2020-10-19 NOTE — ED Notes (Signed)
Pt given phone and menu to call for breakfast order at this time.

## 2020-10-19 NOTE — ED Notes (Signed)
Patient switched over to humidified oxygen at 6L for comfort. Pt watching tv comfortably.

## 2020-10-19 NOTE — ED Notes (Addendum)
Pt given menu and phone at this time. Pt requesting longer O2 cord. Will attempt to locate one. Supply chain called.

## 2020-10-19 NOTE — ED Notes (Signed)
Pharmacy messaged for nicorette gum. Patient requesting at this time.

## 2020-10-20 LAB — COMPREHENSIVE METABOLIC PANEL
ALT: 14 U/L (ref 0–44)
AST: 20 U/L (ref 15–41)
Albumin: 3 g/dL — ABNORMAL LOW (ref 3.5–5.0)
Alkaline Phosphatase: 138 U/L — ABNORMAL HIGH (ref 38–126)
Anion gap: 3 — ABNORMAL LOW (ref 5–15)
BUN: 19 mg/dL (ref 8–23)
CO2: 29 mmol/L (ref 22–32)
Calcium: 8.5 mg/dL — ABNORMAL LOW (ref 8.9–10.3)
Chloride: 95 mmol/L — ABNORMAL LOW (ref 98–111)
Creatinine, Ser: 0.57 mg/dL — ABNORMAL LOW (ref 0.61–1.24)
GFR, Estimated: >60 mL/min (ref >60)
Glucose, Bld: 152 mg/dL — ABNORMAL HIGH (ref 70–99)
Potassium: 3.8 mmol/L (ref 3.5–5.1)
Sodium: 127 mmol/L — ABNORMAL LOW (ref 135–145)
Total Bilirubin: 0.4 mg/dL (ref 0.3–1.2)
Total Protein: 6.2 g/dL — ABNORMAL LOW (ref 6.5–8.1)

## 2020-10-20 LAB — CBC
HCT: 31.4 % — ABNORMAL LOW (ref 39.0–52.0)
Hemoglobin: 10.9 g/dL — ABNORMAL LOW (ref 13.0–17.0)
MCH: 29.5 pg (ref 26.0–34.0)
MCHC: 34.7 g/dL (ref 30.0–36.0)
MCV: 85.1 fL (ref 80.0–100.0)
Platelets: 305 10*3/uL (ref 150–400)
RBC: 3.69 MIL/uL — ABNORMAL LOW (ref 4.22–5.81)
RDW: 15.1 % (ref 11.5–15.5)
WBC: 10.8 10*3/uL — ABNORMAL HIGH (ref 4.0–10.5)
nRBC: 0 % (ref 0.0–0.2)

## 2020-10-20 MED ORDER — TRAMADOL HCL 50 MG PO TABS
50.0000 mg | ORAL_TABLET | Freq: Four times a day (QID) | ORAL | Status: DC | PRN
  Administered 2020-10-20 – 2020-10-22 (×5): 50 mg via ORAL
  Filled 2020-10-20 (×5): qty 1

## 2020-10-20 MED ORDER — METHYLPREDNISOLONE SODIUM SUCC 125 MG IJ SOLR: 80.0000 mg | Freq: Every day | INTRAMUSCULAR | Status: DC

## 2020-10-20 MED ORDER — SODIUM CHLORIDE 0.9 % IV SOLN
INTRAVENOUS | Status: DC | PRN
  Administered 2020-10-20: 250 mL via INTRAVENOUS

## 2020-10-20 NOTE — Progress Notes (Signed)
[place] [facility] [place] NAME: Jeremiah [name]    MR#:  [id number]  DATE OF BIRTH:  1951-01-23  SUBJECTIVE:   patient came in with increasing shortness of breath and cough. Continues to have some shortness of breath which is mainly exertional. No wheezing. REVIEW OF SYSTEMS:   Review of Systems  Constitutional:  Positive for malaise/fatigue. Negative for chills, fever and weight loss.  HENT:  Negative for ear discharge, ear pain and nosebleeds.   Eyes:  Negative for blurred vision, pain and discharge.  Respiratory:  Positive for cough and shortness of breath. Negative for sputum production, wheezing and stridor.   Cardiovascular:  Negative for chest pain, palpitations, orthopnea and PND.  Gastrointestinal:  Negative for abdominal pain, diarrhea, nausea and vomiting.  Genitourinary:  Negative for frequency and urgency.  Musculoskeletal:  Negative for back pain and joint pain.  Neurological:  Positive for weakness. Negative for sensory change, speech change and focal weakness.  Psychiatric/Behavioral:  Negative for depression and hallucinations. The patient is not nervous/anxious.   Tolerating Diet:yes Tolerating PT:   DRUG ALLERGIES:   Allergies  Allergen Reactions   Penicillins Anaphylaxis, Swelling and Other (See Comments)    Tolerated Ceftriaxone 06/2020 05/2020 childhood reaction and his mother told him that he "swelled up."  Had a PCN reaction causing immediate rash, facial/tongue/throat swelling, SOB or lightheadedness with hypotension: Yes Had a PCN reaction causing severe rash involving mucus membranes or skin necrosis: No Had a PCN reaction that required hospitalization No Had a PCN reaction occurring within the last 10 years: No If all the above answers are "NO", may proceed with Cephalosporin use.    VITALS:  Blood pressure 125/69, pulse 94, temperature 97.8 F (36.6 C), resp. rate 18, height 6' (1.829 m), weight 77.4 kg, SpO2 96  %.  PHYSICAL EXAMINATION:   Physical Exam  GENERAL:  70 y.o.-year-old patient lying in the bed with no acute distress. Appears chronically ill  LUNGS: distant breath sounds bilaterally, no wheezing, rales, rhonchi. No use of accessory muscles of respiration.  CARDIOVASCULAR: S1, S2 normal. No murmurs, rubs, or gallops.  ABDOMEN: Soft, nontender, nondistended. Bowel sounds present. No organomegaly or mass.  EXTREMITIES: No cyanosis, clubbing or edema b/l.    NEUROLOGIC: non focal PSYCHIATRIC:  patient is alert and oriented x 3.  SKIN: No obvious rash, lesion, or ulcer.   LABORATORY PANEL:  CBC Recent Labs  Lab 10/20/20 0506  WBC 10.8*  HGB 10.9*  HCT 31.4*  PLT 305    Chemistries  Recent Labs  Lab 10/20/20 0506  NA 127*  K 3.8  CL 95*  CO2 29  GLUCOSE 152*  BUN 19  CREATININE 0.57*  CALCIUM 8.5*  AST 20  ALT 14  ALKPHOS 138*  BILITOT 0.4   Cardiac Enzymes No results for input(s): TROPONINI in the last 168 hours. RADIOLOGY:  CT Angio Chest Pulmonary Embolism (PE) W or WO Contrast  Result Date: 10/18/2020 CLINICAL DATA:  Respiratory failure. Query cancer versus pulmonary embolus. Shortness of breath. EXAM: CT ANGIOGRAPHY CHEST WITH CONTRAST TECHNIQUE: Multidetector CT imaging of the chest was performed using the standard protocol during bolus administration of intravenous contrast. Multiplanar CT image reconstructions and MIPs were obtained to evaluate the vascular anatomy. CONTRAST:  46mL OMNIPAQUE IOHEXOL 350 MG/ML SOLN COMPARISON:  08/04/2015 FINDINGS: Cardiovascular: Satisfactory opacification of the pulmonary arteries to the segmental level. No evidence of pulmonary embolism. Normal heart size. No pericardial effusion. Normal caliber thoracic aorta.  No aortic dissection. Coronary artery and aortic calcifications. Mediastinum/Nodes: Moderately prominent pretracheal and subcarinal lymph nodes as well as right hilar lymph nodes. Largest nodes measure about 1.8 cm  diameter. Lymph nodes are increased in size since prior study. Esophagus is decompressed. Lungs/Pleura: Severe diffuse emphysematous changes and scattered fibrosis in the lungs. Suggestion of superimposed consolidation in the right middle lung, possibly pneumonia although an endobronchial lesion with postobstructive change could have this appearance as well. Upper Abdomen: No acute abnormalities demonstrated in the visualized upper abdomen. Musculoskeletal: Degenerative changes in the spine. No destructive bone lesions identified. Motion artifact limits examination. Review of the MIP images confirms the above findings. IMPRESSION: 1. No evidence of significant pulmonary embolus. 2. Diffuse emphysematous changes throughout the lungs. 3. Focal consolidation in the right middle lung likely represents pneumonia but follow-up after resolution of acute process is recommended to exclude centrally obstructing lesion. 4. Mild but increasing lymphadenopathy in the mediastinum and right hilum since prior study. Electronically Signed   By: Lucienne Capers M.D.   On: 10/18/2020 22:10   DG Chest Port 1 View  Result Date: 10/18/2020 CLINICAL DATA:  Shortness of breath EXAM: PORTABLE CHEST 1 VIEW COMPARISON:  09/26/2020 FINDINGS: There is hyperinflation of the lungs compatible with COPD. Diffuse interstitial prominence throughout the lungs compatible with chronic lung disease/fibrosis. Increasing opacity at the right lung base could reflect superimposed infiltrate. Heart is normal size. No effusions or acute bony abnormality. IMPRESSION: COPD/changes of fibrosis. Increasing opacity at the right lung base again noted concerning for superimposed infiltrate. Electronically Signed   By: Rolm Baptise M.D.   On: 10/18/2020 19:47   ASSESSMENT AND PLAN:  70 year old male with history of COPD on 2 L nasal cannula at home, ongoing tobacco use, hypertension, comes to the hospital with complaints of shortness of breath.  Patient tells  me his symptoms have been rather sudden and has noticed shortness of breath along with significant wheezing unrelieved by home treatment and decided to call EMS.  He was hypoxic with sats in the 70s requiring nonrebreather and BiPAP in the ED  Acute on chronic hypoxic respiratory failure due to COPD exacerbation in the setting of lobar pneumonia in the right middle lung  -continue supportive care with Levaquin, steroids, nebulizers -Seems to be improving, currently on 4 L, wean off to home 2 L as tolerated -- continue steroids and PRN bronchodilators  Hyponatremia-in the setting of poor p.o. intake at home prior to admission as well as HCTZ use.  He is responding to IV fluids, sodium improved from 118-123 this morning -- sodium improved to 127. Mentation is clear.  Tobacco use-nicotine patch, he was counseled for cessation  Hyperlipidemia-continue statin  Essential hypertension-continue home diltiazem, blood pressure controlled   Lower extremity swelling-chronic, per patient worsening during the day and improving upon laying flat.  Most recent 2D echo in February 2022 showed EF 08-65% with diastolic parameters indeterminate.  RV was normal.   -Likely due to chronic venous insufficiency.  Put TED hoses  Procedures: Family communication : none Consults : none CODE STATUS:  DVT Prophylaxis : Level of care: Progressive Cardiac Status is: Inpatient  Remains inpatient appropriate because:Inpatient level of care appropriate due to severity of illness  Dispo: The patient is from: Home              Anticipated d/c is to: Home              Patient currently is not medically stable to d/c.  Difficult to place patient No        TOTAL TIME TAKING CARE OF THIS PATIENT: 25 minutes.  >50% time spent on counselling and coordination of care  Note: This dictation was prepared with Dragon dictation along with smaller phrase technology. Any transcriptional errors that result from this process  are unintentional.  Fritzi Mandes M.D    [facility]   CC: Primary care physician; [place] Patient ID: Jeremiah [name], male   DOB: 02-07-1951, 70 y.o.   MRN: [id number]

## 2020-10-20 NOTE — Progress Notes (Signed)
Mobility Specialist - Progress Note   10/20/20 1436  Mobility  Activity Ambulated in room  Level of Assistance Standby assist, set-up cues, supervision of patient - no hands on  Assistive Device Front wheel walker  Distance Ambulated (ft) 20 ft  Mobility Ambulated with assistance in room  Mobility Response Tolerated well  Mobility performed by Mobility specialist  $Mobility charge 1 Mobility    Pre-mobility: 97 HR, 96% SpO2 During mobility: 111 HR, 84% SpO2 Post-mobility: 99 HR, 95% SpO2   Pt sleeping in bed upon arrival, easily awakened by voice. Utilizing 4L. Pt reports using 2L at baseline. Denied pain, nausea, and fatigue. Pt declined transfer to recliner but agreeable to ambulate only in room. ModI for bed mobility. No dizziness. Pt reports using a RW at home occasionally, but able to navigate with IV pole this date. O2 desat to 84% during ambulation, and was bumped up to 6L as pt returned EOB. Extensive recovery to retrieve sats > 88% with poor carryover on PLB technique. RPE 8.5/10 with breathing management reported as most difficult part of session. Pt left in bed, on 4L, with alarm set.    Jeremiah [name] Mobility Specialist 10/20/20, 2:48 PM

## 2020-10-20 NOTE — Progress Notes (Signed)
PT Cancellation Note  Patient Details Name: Jeremiah [name] MRN: [id number] DOB: Apr 05, 1951   Cancelled Treatment:    Reason Eval/Treat Not Completed: Fatigue/lethargy limiting ability to participate. Patient just worked with mobility specialist. Will attempt PT Evaluation tomorrow.    Tichina Koebel 10/20/2020, 3:58 PM

## 2020-10-21 MED ORDER — LEVOFLOXACIN 500 MG PO TABS
750.0000 mg | ORAL_TABLET | Freq: Every day | ORAL | Status: AC
  Administered 2020-10-21 – 2020-10-22 (×2): 750 mg via ORAL
  Filled 2020-10-21 (×2): qty 2

## 2020-10-21 MED ORDER — METHYLPREDNISOLONE SODIUM SUCC 125 MG IJ SOLR
60.0000 mg | Freq: Every day | INTRAMUSCULAR | Status: DC
  Administered 2020-10-21 – 2020-10-23 (×3): 60 mg via INTRAVENOUS
  Filled 2020-10-21 (×3): qty 2

## 2020-10-21 MED ORDER — PANTOPRAZOLE SODIUM 40 MG PO TBEC
40.0000 mg | DELAYED_RELEASE_TABLET | Freq: Every day | ORAL | Status: DC
  Administered 2020-10-21 – 2020-10-23 (×3): 40 mg via ORAL
  Filled 2020-10-21 (×3): qty 1

## 2020-10-21 NOTE — Progress Notes (Signed)
[place] [facility] [place] NAME: Jeremiah [name]    MR#:  [id number]  DATE OF BIRTH:  10-08-1950  SUBJECTIVE:   patient came in with increasing shortness of breath and cough.   Sats 95--98% on 2.5 L/[place] Feels better Eating well REVIEW OF SYSTEMS:   Review of Systems  Constitutional:  Positive for malaise/fatigue. Negative for chills, fever and weight loss.  HENT:  Negative for ear discharge, ear pain and nosebleeds.   Eyes:  Negative for blurred vision, pain and discharge.  Respiratory:  Positive for cough and shortness of breath. Negative for sputum production, wheezing and stridor.   Cardiovascular:  Negative for chest pain, palpitations, orthopnea and PND.  Gastrointestinal:  Negative for abdominal pain, diarrhea, nausea and vomiting.  Genitourinary:  Negative for frequency and urgency.  Musculoskeletal:  Negative for back pain and joint pain.  Neurological:  Positive for weakness. Negative for sensory change, speech change and focal weakness.  Psychiatric/Behavioral:  Negative for depression and hallucinations. The patient is not nervous/anxious.   Tolerating Diet:yes Tolerating PT: pending  DRUG ALLERGIES:   Allergies  Allergen Reactions   Penicillins Anaphylaxis, Swelling and Other (See Comments)    Tolerated Ceftriaxone 06/2020 05/2020 childhood reaction and his mother told him that he "swelled up."  Had a PCN reaction causing immediate rash, facial/tongue/throat swelling, SOB or lightheadedness with hypotension: Yes Had a PCN reaction causing severe rash involving mucus membranes or skin necrosis: No Had a PCN reaction that required hospitalization No Had a PCN reaction occurring within the last 10 years: No If all the above answers are "NO", may proceed with Cephalosporin use.    VITALS:  Blood pressure 112/62, pulse 75, temperature 97.9 F (36.6 C), resp. rate 18, height 6' (1.829 m), weight 77.4 kg, SpO2 98 %.  PHYSICAL  EXAMINATION:   Physical Exam  GENERAL:  70 y.o.-year-old patient lying in the bed with no acute distress. Appears chronically ill  LUNGS: distant breath sounds bilaterally, no wheezing, rales, rhonchi. No use of accessory muscles of respiration.  CARDIOVASCULAR: S1, S2 normal. No murmurs, rubs, or gallops.  ABDOMEN: Soft, nontender, nondistended. Bowel sounds present. No organomegaly or mass.  EXTREMITIES: No cyanosis, clubbing or edema b/l.    NEUROLOGIC: non focal, weak PSYCHIATRIC:  patient is alert and oriented x 3.  SKIN: No obvious rash, lesion, or ulcer.   LABORATORY PANEL:  CBC Recent Labs  Lab 10/20/20 0506  WBC 10.8*  HGB 10.9*  HCT 31.4*  PLT 305     Chemistries  Recent Labs  Lab 10/20/20 0506  NA 127*  K 3.8  CL 95*  CO2 29  GLUCOSE 152*  BUN 19  CREATININE 0.57*  CALCIUM 8.5*  AST 20  ALT 14  ALKPHOS 138*  BILITOT 0.4    Cardiac Enzymes No results for input(s): TROPONINI in the last 168 hours. RADIOLOGY:  No results found. ASSESSMENT AND PLAN:  70 year old male with history of COPD on 2 L nasal cannula at home, ongoing tobacco use, hypertension, comes to the hospital with complaints of shortness of breath.  Patient tells me his symptoms have been rather sudden and has noticed shortness of breath along with significant wheezing unrelieved by home treatment and decided to call EMS.  He was hypoxic with sats in the 70s requiring nonrebreather and BiPAP in the ED  Acute on chronic hypoxic respiratory failure due to COPD exacerbation in the setting of lobar pneumonia in the right middle lung  -  continue supportive care with Levaquin, steroids, nebulizers -Seems to be improving, currently on 2.5 L, wean off to home 2 L as tolerated -- continue steroids and PRN bronchodilators --keep sats >85%  Hyponatremia-in the setting of poor p.o. intake at home prior to admission as well as HCTZ use and severe emphysema --received IV fluids, sodium improved from  118--->127 this morning -- sodium improved to 127. Mentation is clear.  Tobacco use-nicotine patch, he was counseled for cessation  Hyperlipidemia-continue statin  Essential hypertension-continue home diltiazem, blood pressure controlled   Lower extremity swelling-chronic, per patient worsening during the day and improving upon laying flat.  Most recent 2D echo in February 2022 showed EF 03-21% with diastolic parameters indeterminate.  RV was normal.   -Likely due to chronic venous insufficiency.    Procedures: Family communication : none Consults : none CODE STATUS: FULL DVT Prophylaxis :Heparin SQ Level of care: Progressive Cardiac Status is: Inpatient  Remains inpatient appropriate because:Inpatient level of care appropriate due to severity of illness  Dispo: The patient is from: Home              Anticipated d/c is to: Home              Patient currently is not medically stable to d/c.   Difficult to place patient No  patient showing clinical improvement. PT to see patient today. If continues to show improvement hoping to discharge him tomorrow. Patient in agreement with plan.      TOTAL TIME TAKING CARE OF THIS PATIENT: 25 minutes.  >50% time spent on counselling and coordination of care  Note: This dictation was prepared with Dragon dictation along with smaller phrase technology. Any transcriptional errors that result from this process are unintentional.  Fritzi Mandes M.D    [facility]   CC: Primary care physician; [name] Patient ID: Jeremiah [name], male   DOB: 07/14/1950, 70 y.o.   MRN: [id number]

## 2020-10-21 NOTE — Progress Notes (Signed)
Pt up to chair for 7 hours today. Tolerated well.

## 2020-10-21 NOTE — Plan of Care (Signed)
  Problem: Education: Goal: Knowledge of General Education information will improve Description: Including pain rating scale, medication(s)/side effects and non-pharmacologic comfort measures Outcome: Progressing   Problem: Health Behavior/Discharge Planning: Goal: Ability to manage health-related needs will improve Outcome: Progressing   Problem: Clinical Measurements: Goal: Respiratory complications will improve Outcome: Progressing   Problem: Activity: Goal: Risk for activity intolerance will decrease Outcome: Progressing   Problem: Pain Managment: Goal: General experience of comfort will improve Outcome: Progressing   Problem: Safety: Goal: Ability to remain free from injury will improve Outcome: Progressing   

## 2020-10-21 NOTE — Evaluation (Signed)
Physical Therapy Evaluation Patient Details Name: Jeremiah [name] MRN: [id number] DOB: [birth year] Today's Date: 10/21/2020   History of Present Illness  Jeremiah [name] is a 70 y.o. male seen in ed with complaints of presenting to the emergency room with complaints of shortness of breath on 2 L of oxygen at home that he wears chronically at home for his COPD but his shortness of breath has been progressive and getting worse.  Pt has past medical history of asthma, chronic respiratory failure on 2 L of home oxygen secondary to COPD, GERD, hypertension, tobacco abuse.   Clinical Impression  Patient received in bed, initially wants to drink his coffee prior to PT. Easily irritated throughout session. Patient is mod independent with bed mobility and transfers. Ambulated 12 feet with min guard/supervision and RW. Patient limited by oxygen saturations with mobility. Arrived with patient on 2 lpm ( his baseline). Upon sitting on edge of bed saturations down to 88%. Increased O2 to 3 lpm and able to get sats up to 94%. After ambulating 12 feet on 3 lpm saturations down to 78%. Increased O2 to 4 lpm and sats able to increase back to 93%. Left patient on his normal 2 lpm. Patient will continue to benefit from skilled PT while here to improve activity tolerance and safety with mobility.      Follow Up Recommendations Home health PT    Equipment Recommendations  Other (comment) (pulse oximeter)    Recommendations for Other Services       Precautions / Restrictions Precautions Precautions: Fall Restrictions Weight Bearing Restrictions: No      Mobility  Bed Mobility Overal bed mobility: Modified Independent                  Transfers Overall transfer level: Modified independent Equipment used: Rolling walker (2 wheeled)             General transfer comment: patient required increased effort to stand, 2 attempts to get up. Does not want  assistance.  Ambulation/Gait Ambulation/Gait assistance: Min guard Gait Distance (Feet): 12 Feet Assistive device: Rolling walker (2 wheeled) Gait Pattern/deviations: Step-through pattern;Decreased step length - right;Decreased step length - left;Trunk flexed Gait velocity: decr   General Gait Details: patient ambulates with slow cadence, mild instability. O2 saturations dropped to 78% on 3 lpm walking this distance. Increased O2 to 4 lpm and sats up to 95%  Stairs            Wheelchair Mobility    Modified Rankin (Stroke Patients Only)       Balance Overall balance assessment: Needs assistance Sitting-balance support: Feet supported Sitting balance-Leahy Scale: Good     Standing balance support: Bilateral upper extremity supported;During functional activity Standing balance-Leahy Scale: Poor Standing balance comment: reliant on UE support, mildly unsteady                             Pertinent Vitals/Pain Pain Assessment: No/denies pain    Home Living Family/patient expects to be discharged to:: Private residence Living Arrangements: Non-relatives/Friends Available Help at Discharge: Friend(s);Available 24 hours/day Type of Home: Mobile home Home Access: Stairs to enter Entrance Stairs-Rails: Right;Left Entrance Stairs-Number of Steps: 10 at front, bilat rails; 2 at back door, but unable to access back door. ( taken from prior visit) Home Layout: One level Home Equipment: Grab bars - toilet;Walker - 2 wheels;Bedside commode      Prior Function Level of Independence: Needs assistance  Gait / Transfers Assistance Needed: Says he has a walker does not always use.  ADL's / Homemaking Assistance Needed: Reports he manages BADLs I'ly including toileting and grooming. Pt requires some assist occasionally for IADLs. Usually MOD I with bathing/dressing but was requiring increased assistance in past month or more.        Hand Dominance   Dominant Hand:  Right    Extremity/Trunk Assessment   Upper Extremity Assessment Upper Extremity Assessment: Generalized weakness    Lower Extremity Assessment Lower Extremity Assessment: Generalized weakness    Cervical / Trunk Assessment Cervical / Trunk Assessment: Normal  Communication   Communication: No difficulties  Cognition Arousal/Alertness: Awake/alert Behavior During Therapy: Agitated Overall Cognitive Status: Within Functional Limits for tasks assessed                                        General Comments      Exercises     Assessment/Plan    PT Assessment Patient needs continued PT services  PT Problem List Decreased mobility;Decreased activity tolerance;Decreased balance;Cardiopulmonary status limiting activity       PT Treatment Interventions Therapeutic exercise;Gait training;Functional mobility training;Therapeutic activities;Patient/family education;Balance training;Stair training    PT Goals (Current goals can be found in the Care Plan section)  Acute Rehab PT Goals Patient Stated Goal: to return home PT Goal Formulation: With patient Time For Goal Achievement: 10/28/20 Potential to Achieve Goals: Fair    Frequency Min 2X/week   Barriers to discharge        Co-evaluation               AM-PAC PT "6 Clicks" Mobility  Outcome Measure Help needed turning from your back to your side while in a flat bed without using bedrails?: None Help needed moving from lying on your back to sitting on the side of a flat bed without using bedrails?: None Help needed moving to and from a bed to a chair (including a wheelchair)?: A Little Help needed standing up from a chair using your arms (e.g., wheelchair or bedside chair)?: A Little Help needed to walk in hospital room?: A Little Help needed climbing 3-5 steps with a railing? : A Lot 6 Click Score: 19    End of Session Equipment Utilized During Treatment: Oxygen Activity Tolerance: Patient  limited by fatigue;Other (comment) (sob) Patient left: in chair;with chair alarm set;with call bell/phone within reach Nurse Communication: Mobility status PT Visit Diagnosis: Difficulty in walking, not elsewhere classified (R26.2)    Time: 8891-6945 PT Time Calculation (min) (ACUTE ONLY): 24 min   Charges:   PT Evaluation $PT Eval Moderate Complexity: 1 Mod PT Treatments $Gait Training: 8-22 mins        [employer], PT, [employer] 10/21/20,10:01 AM

## 2020-10-21 NOTE — Care Management Important Message (Signed)
Important Message  Patient Details  Name: Jeremiah [name] MRN: [id number] Date of Birth: 07/03/1950   Medicare Important Message Given:  Yes     Dannette Barbara 10/21/2020, 1:37 PM

## 2020-10-21 NOTE — TOC Initial Note (Signed)
Transition of Care [facility]) - Initial/Assessment Note    Patient Details  Name: Jeremiah [name] MRN: [id number] Date of Birth: 02/26/1951  Transition of Care [facility] CM/SW Contact:    Eileen Stanford, LCSW Phone Number: 10/21/2020, 1:42 PM  Clinical Narrative:  CSW spoke with pt and pt states he doesn't need HH. Pt states if he needs help after he gets home he will reach out. Pt states he lives with friends who help him. Pt states he has a walker at home. Pt was requesting a pulse ox--provided to pt at bedside. Pt states his friends take him to his doctor appointments. Pt states he goes to [facility] and also gets him meds from there.                 Expected Discharge Plan: Home/Self Care Barriers to Discharge: Continued Medical Work up   Patient Goals and CMS Choice     Choice offered to / list presented to : Patient  Expected Discharge Plan and Services Expected Discharge Plan: Home/Self Care In-house Referral: Clinical Social Work   Post Acute Care Choice: NA Living arrangements for the past 2 months: Single Family Home                                      Prior Living Arrangements/Services Living arrangements for the past 2 months: Single Family Home Lives with:: Friends Patient language and need for interpreter reviewed:: Yes Do you feel safe going back to the place where you live?: Yes      Need for Family Participation in Patient Care: Yes (Comment) Care giver support system in place?: Yes (comment)   Criminal Activity/Legal Involvement Pertinent to Current Situation/Hospitalization: No - Comment as needed  Activities of Daily Living Home Assistive Devices/Equipment: Oxygen, Walker (specify type) ADL Screening (condition at time of admission) Patient's cognitive ability adequate to safely complete daily activities?: Yes Is the patient deaf or have difficulty hearing?: No Does the patient have difficulty seeing, even when wearing glasses/contacts?:  No Does the patient have difficulty concentrating, remembering, or making decisions?: Yes Patient able to express need for assistance with ADLs?: Yes Does the patient have difficulty dressing or bathing?: No Independently performs ADLs?: Yes (appropriate for developmental age) Does the patient have difficulty walking or climbing stairs?: Yes Weakness of Legs: Both Weakness of Arms/Hands: None  Permission Sought/Granted Permission sought to share information with : Family Supports                Emotional Assessment Appearance:: Appears stated age Attitude/Demeanor/Rapport: Engaged Affect (typically observed): Accepting Orientation: : Oriented to Self, Oriented to Place, Oriented to  Time, Oriented to Situation Alcohol / Substance Use: Not Applicable Psych Involvement: No (comment)  Admission diagnosis:  Hyponatremia [E87.1] Acute and chronic respiratory failure (acute-on-chronic) (HCC) [J96.20] SOB (shortness of breath) [R06.02] COPD exacerbation (HCC) [J44.1] Patient Active Problem List   Diagnosis Date Noted   Anemia 10/18/2020   Acute and chronic respiratory failure (acute-on-chronic) ([place]) 10/18/2020   Multifocal pneumonia 06/16/2020   Cellulitis 05/15/2020   COPD with chronic bronchitis ([place]) 05/14/2020   Nicotine dependence 05/14/2020   Chronic respiratory failure with hypoxia ([place]) 05/14/2020   Cellulitis of right foot 05/14/2020   Hyponatremia 05/14/2020   Sepsis ([place]) 05/14/2020   HTN (hypertension) 05/14/2020   Abnormal LFTs 05/14/2020   Acute on chronic respiratory failure with hypoxia ([place]) 10/15/2016   Acute  bronchitis 10/15/2016   Tobacco dependency 10/15/2016   PCP:  [place] [facility]:   [facility] [address] [place] [address] Phone: [phone] Fax: [place], [place] - [place]  [address] Phone: [phone] Fax: [phone]     Social Determinants of Health (SDOH) Interventions    Readmission Risk Interventions Readmission Risk Prevention Plan 10/21/2020 09/27/2020 06/17/2020  Transportation Screening Complete Complete Complete  PCP or Specialist Appt within 3-5 Days Complete Complete Complete  HRI or Home Care Consult Complete Complete Complete  Social Work Consult for [place] Planning/Counseling Complete Not Complete Complete  SW consult not completed comments - RNCM assigned to patient -  Palliative Care Screening Not Applicable Not Applicable Not Applicable  Medication Review (RN Care Manager) Complete Complete Complete  Some recent data might be hidden

## 2020-10-22 DIAGNOSIS — Z515 Encounter for palliative care: Secondary | ICD-10-CM

## 2020-10-22 DIAGNOSIS — Z7189 Other specified counseling: Secondary | ICD-10-CM

## 2020-10-22 NOTE — Progress Notes (Signed)
[facility])  Referral received for outpatient palliative care services once patient discharges home, likely Saturday.  [facility] will follow up with the patient and family in the home.  [name] RN, BSN, [place] [facility] Liaison

## 2020-10-22 NOTE — Progress Notes (Addendum)
[place] [facility] [place] NAME: Jeremiah [name]    MR#:  [id number]  DATE OF BIRTH:  [birth year]  SUBJECTIVE:   patient came in with increasing shortness of breath and cough.   Sats 95--98% on 2.5 L/[place] patient continues to fail poorly today. Requesting to stay one more day. He gets very short winded on minimal exertion. Discussed with him this is due to his end-stage/severe emphysema Eating well REVIEW OF SYSTEMS:   Review of Systems  Constitutional:  Positive for malaise/fatigue. Negative for chills, fever and weight loss.  HENT:  Negative for ear discharge, ear pain and nosebleeds.   Eyes:  Negative for blurred vision, pain and discharge.  Respiratory:  Positive for cough and shortness of breath. Negative for sputum production, wheezing and stridor.   Cardiovascular:  Negative for chest pain, palpitations, orthopnea and PND.  Gastrointestinal:  Negative for abdominal pain, diarrhea, nausea and vomiting.  Genitourinary:  Negative for frequency and urgency.  Musculoskeletal:  Negative for back pain and joint pain.  Neurological:  Positive for weakness. Negative for sensory change, speech change and focal weakness.  Psychiatric/Behavioral:  Negative for depression and hallucinations. The patient is not nervous/anxious.   Tolerating Diet:yes Tolerating PT: HHPT  DRUG ALLERGIES:   Allergies  Allergen Reactions   Penicillins Anaphylaxis, Swelling and Other (See Comments)    Tolerated Ceftriaxone 06/2020 05/2020 childhood reaction and his mother told him that he "swelled up."  Had a PCN reaction causing immediate rash, facial/tongue/throat swelling, SOB or lightheadedness with hypotension: Yes Had a PCN reaction causing severe rash involving mucus membranes or skin necrosis: No Had a PCN reaction that required hospitalization No Had a PCN reaction occurring within the last 10 years: No If all the above answers are "NO", may proceed with Cephalosporin  use.    VITALS:  Blood pressure 108/69, pulse 86, temperature 97.9 F (36.6 C), temperature source Oral, resp. rate 18, height 6' (1.829 m), weight 77.4 kg, SpO2 95 %.  PHYSICAL EXAMINATION:   Physical Exam  GENERAL:  70 y.o.-year-old patient lying in the bed with no acute distress. Appears chronically ill  LUNGS: distant breath sounds bilaterally, no wheezing, rales, rhonchi. No use of accessory muscles of respiration.  CARDIOVASCULAR: S1, S2 normal. No murmurs, rubs, or gallops.  ABDOMEN: Soft, nontender, nondistended. Bowel sounds present. No organomegaly or mass.  EXTREMITIES: No cyanosis, clubbing or edema b/l.    NEUROLOGIC: non focal, weak PSYCHIATRIC:  patient is alert and oriented x 3.  SKIN: No obvious rash, lesion, or ulcer.   LABORATORY PANEL:  CBC Recent Labs  Lab 10/20/20 0506  WBC 10.8*  HGB 10.9*  HCT 31.4*  PLT 305     Chemistries  Recent Labs  Lab 10/20/20 0506  NA 127*  K 3.8  CL 95*  CO2 29  GLUCOSE 152*  BUN 19  CREATININE 0.57*  CALCIUM 8.5*  AST 20  ALT 14  ALKPHOS 138*  BILITOT 0.4    Cardiac Enzymes No results for input(s): TROPONINI in the last 168 hours. RADIOLOGY:  No results found. ASSESSMENT AND PLAN:  70 year old male with history of COPD on 2 L nasal cannula at home, ongoing tobacco use, hypertension, comes to the hospital with complaints of shortness of breath.  Patient tells me his symptoms have been rather sudden and has noticed shortness of breath along with significant wheezing unrelieved by home treatment and decided to call EMS.  He was hypoxic with sats in the  70s requiring nonrebreather and BiPAP in the ED  Acute on chronic hypoxic respiratory failure due to COPD exacerbation in the setting of lobar pneumonia in the right middle lung  -continue supportive care with Levaquin, steroids, nebulizers -Seems to be improving, currently on 2.5 L, wean off to home 2 L as tolerated -- continue steroids and PRN  bronchodilators --keep sats >85%  Hyponatremia-in the setting of poor p.o. intake at home prior to admission as well as HCTZ use and severe emphysema --received IV fluids, sodium improved from 118--->127 this morning -- sodium improved to 127. Mentation is clear.  Tobacco use-nicotine patch, he was counseled for cessation  Hyperlipidemia-continue statin  Essential hypertension-continue home diltiazem, blood pressure controlled   Lower extremity swelling-chronic, per patient worsening during the day and improving upon laying flat.  Most recent 2D echo in February 2022 showed EF 56-31% with diastolic parameters indeterminate.  RV was normal.   -Likely due to chronic venous insufficiency.    Pt will benefit from outpatient ambulatory referral for palliative care given his severe/end-stage COPD/emphysema. TOC to arrange for outpatient palliative care  Procedures: Family communication : Radiation protection practitioner (friend) Consults : none CODE STATUS: FULL DVT Prophylaxis :Heparin SQ Level of care: Progressive Cardiac Status is: Inpatient  Remains inpatient appropriate because:Inpatient level of care appropriate due to severity of illness  Dispo: The patient is from: Home              Anticipated d/c is to: Home              Patient currently is slowly improving   Difficult to place patient No  patient showing clinical improvement. . If continues to show improvement hoping to discharge him tomorrow. Patient in agreement with plan.      TOTAL TIME TAKING CARE OF THIS PATIENT: 25 minutes.  >50% time spent on counselling and coordination of care  Note: This dictation was prepared with Dragon dictation along with smaller phrase technology. Any transcriptional errors that result from this process are unintentional.  Fritzi Mandes M.D    [facility]   CC: Primary care physician; [place] Patient ID: Carrick Rijos, male   DOB: [birth year], 70 y.o.   MRN:  [id number]

## 2020-10-22 NOTE — Progress Notes (Signed)
Physical Therapy Treatment Patient Details Name: Jeremiah [name] MRN: [id number] DOB: 1950/08/25 Today's Date: 10/22/2020    History of Present Illness Lydon Vansickle is a 70 y.o. male seen in ed with complaints of presenting to the emergency room with complaints of shortness of breath on 2 L of oxygen at home that he wears chronically at home for his COPD but his shortness of breath has been progressive and getting worse.  Pt has past medical history of asthma, chronic respiratory failure on 2 L of home oxygen secondary to COPD, GERD, hypertension, tobacco abuse.    PT Comments    Patient received in bed, he is agreeable to PT session. Patient reports he feels a little better. Requires no assistance for bed mobility, but increased time once seated on edge of bed to prepare for mobility due to sob. Patient is able to ambulate 20 feet in room on 4 lpm O2, min guard. Sats dropped to 80% after mobility on 4 liters. Requires increased time to recover with additional O2 at 6 liters. Left patient at 93% on 4 liters sitting in recliner. Chair alarm on and needs met. Patient will continue to benefit from skilled PT while here to improve functional independence, activity tolerance.     Follow Up Recommendations  Home health PT     Equipment Recommendations       Recommendations for Other Services       Precautions / Restrictions Precautions Precautions: Fall Precaution Comments: O2 saturations drop on 4 liters to 80% with ambulation of 20 feet. Increased O2 to 6 liters temporarily. Left patient on 4 liters. RN aware Restrictions Weight Bearing Restrictions: No    Mobility  Bed Mobility Overal bed mobility: Modified Independent                  Transfers Overall transfer level: Modified independent Equipment used: Rolling walker (2 wheeled)             General transfer comment: increased effort. Does not want physical assist.  Ambulation/Gait Ambulation/Gait  assistance: Min guard Gait Distance (Feet): 20 Feet Assistive device: Rolling walker (2 wheeled) Gait Pattern/deviations: Step-through pattern;Decreased step length - right;Decreased step length - left;Trunk flexed Gait velocity: decr   General Gait Details: patient ambulates with slow cadence, mild instability. O2 saturations dropped to 80% on 4 lpm walking this distance.   Stairs             Wheelchair Mobility    Modified Rankin (Stroke Patients Only)       Balance Overall balance assessment: Needs assistance Sitting-balance support: Feet supported Sitting balance-Leahy Scale: Good     Standing balance support: Bilateral upper extremity supported;During functional activity Standing balance-Leahy Scale: Fair Standing balance comment: reliant on UE support, mildly unsteady                            Cognition Arousal/Alertness: Awake/alert Behavior During Therapy: WFL for tasks assessed/performed Overall Cognitive Status: Within Functional Limits for tasks assessed                                        Exercises      General Comments        Pertinent Vitals/Pain Pain Assessment: No/denies pain    Home Living  Prior Function            PT Goals (current goals can now be found in the care plan section) Acute Rehab PT Goals Patient Stated Goal: to return home PT Goal Formulation: With patient Time For Goal Achievement: 10/28/20 Potential to Achieve Goals: Fair Progress towards PT goals: Progressing toward goals    Frequency    Min 2X/week      PT Plan Current plan remains appropriate    Co-evaluation              AM-PAC PT "6 Clicks" Mobility   Outcome Measure  Help needed turning from your back to your side while in a flat bed without using bedrails?: None Help needed moving from lying on your back to sitting on the side of a flat bed without using bedrails?: None Help  needed moving to and from a bed to a chair (including a wheelchair)?: A Little Help needed standing up from a chair using your arms (e.g., wheelchair or bedside chair)?: None Help needed to walk in hospital room?: A Little Help needed climbing 3-5 steps with a railing? : A Lot 6 Click Score: 20    End of Session Equipment Utilized During Treatment: Oxygen Activity Tolerance: Patient limited by fatigue;Other (comment) (Desaturation with mobility) Patient left: in chair;with call bell/phone within reach;with chair alarm set Nurse Communication: Mobility status;Other (comment) (oxygen saturation) PT Visit Diagnosis: Difficulty in walking, not elsewhere classified (R26.2)     Time: 1350-1420 PT Time Calculation (min) (ACUTE ONLY): 30 min  Charges:  $Gait Training: 23-37 mins                    [employer], PT, [employer] 10/22/20,3:13 PM

## 2020-10-22 NOTE — Consult Note (Signed)
Consultation Note Date: 10/22/2020   Patient Name: Jeremiah [name]  DOB: 1950/10/26  MRN: [id number]  Age / Sex: 70 y.o., male  PCP: [place] Referring Physician: Fritzi Mandes, MD  Reason for Consultation: Establishing goals of care  HPI/Patient Profile:  Jeremiah [name] is a 70 y.o. male seen in ed with complaints of presenting to the emergency room with complaints of shortness of breath on 2 L of oxygen at home that he wears chronically at home for his COPD but his shortness of breath has been progressive and getting worse.  Per EMS patient was hypoxic at 72% on 2 L and was started on a nonrebreather, here patient was 10 transition to NIPPV.  Clinical Assessment and Goals of Care: Patient is sitting in bedside chair. He appears SOB. He states he is divorced and has children. He does state he does not know where his children are. He states Jeremiah [name] would be his Air traffic controller. Explained purpose and offered to have HPOA papers completed but he declines.   He states at home he uses 2 lpm of O2. He states he has good days and bad days. He uses a walker at times. He washes his clothes and dishes.   We discussed his diagnosis, prognosis, GOC, EOL wishes disposition and options.  Created space and opportunity for patient  to explore thoughts and feelings regarding current medical information.   A detailed discussion was had today regarding advanced directives.  Concepts specific to code status, artifical feeding and hydration, IV antibiotics and rehospitalization were discussed.  The difference between an aggressive medical intervention path and a comfort care path was discussed.  Values and goals of care important to patient and family were attempted to be elicited.  Discussed limitations of medical interventions to prolong quality of life in some situations and discussed the  concept of human mortality.  He understands his COPD is progressive. He states he is not prepared to make any decisions regarding GOC at this time.      SUMMARY OF RECOMMENDATIONS   Recommend continued palliative at D/C.   Prognosis:  Poor overall       Primary Diagnoses: Present on Admission:  Acute on chronic respiratory failure with hypoxia (HCC)  Tobacco dependency  Hyponatremia  HTN (hypertension)  Abnormal LFTs  Anemia  Acute and chronic respiratory failure (acute-on-chronic) ([place])   I have reviewed the medical record, interviewed the patient and family, and examined the patient. The following aspects are pertinent.  Past Medical History:  Diagnosis Date   Asthma    COPD (chronic obstructive pulmonary disease) (HCC)    O2 dependent - 2L   GERD (gastroesophageal reflux disease)    HTN (hypertension)    Oxygen dependent    Tobacco dependence    Social History   Socioeconomic History   Marital status: Divorced    Spouse name: Not on file   Number of children: Not on file   Years of education: Not on file   Highest education level:  Not on file  Occupational History   Not on file  Tobacco Use   Smoking status: Every Day    Packs/day: 0.50    Years: 50.00    Pack years: 25.00    Types: Cigarettes    Last attempt to quit: 07/28/2015    Years since quitting: 5.2   Smokeless tobacco: Never   Tobacco comments:    started age 59.  Vaping Use   Vaping Use: Never used  Substance and Sexual Activity   Alcohol use: Yes    Alcohol/week: 0.0 standard drinks    Comment: beers occasionally   Drug use: No   Sexual activity: Not on file  Other Topics Concern   Not on file  Social History Narrative   Lives at home with wife.   Social Determinants of Health   Financial Resource Strain: Not on file  Food Insecurity: Not on file  Transportation Needs: Not on file  Physical Activity: Not on file  Stress: Not on file  Social Connections: Not on file   Family  History  Problem Relation Age of Onset   Cancer - Colon Father    Congestive Heart Failure Mother    Scheduled Meds:  atorvastatin  40 mg Oral QPM   diltiazem  120 mg Oral BID   fluticasone furoate-vilanterol  1 puff Inhalation Daily   heparin  5,000 Units Subcutaneous Q8H   ipratropium-albuterol  3 mL Nebulization Q6H   levofloxacin  750 mg Oral Daily   methylPREDNISolone (SOLU-MEDROL) injection  60 mg Intravenous Daily   montelukast  10 mg Oral QHS   nicotine  21 mg Transdermal Daily   pantoprazole  40 mg Oral Daily   sodium chloride flush  10 mL Intravenous Q12H   tiotropium  18 mcg Inhalation Daily   Continuous Infusions:  sodium chloride 250 mL (10/20/20 2224)   PRN Meds:.sodium chloride, acetaminophen, albuterol, nicotine polacrilex, ondansetron **OR** ondansetron (ZOFRAN) IV, traMADol Medications Prior to Admission:  Prior to Admission medications   Medication Sig Start Date End Date Taking? Authorizing Provider  albuterol (VENTOLIN HFA) 108 (90 Base) MCG/ACT inhaler Inhale into the lungs every 6 (six) hours as needed for wheezing or shortness of breath.   Yes [provider]  atorvastatin (LIPITOR) 40 MG tablet Take 40 mg by mouth every evening.   Yes [provider]  BREO ELLIPTA 100-25 MCG/INH AEPB Inhale 1 puff into the lungs daily. 09/30/20  Yes Jennye Boroughs, MD  diltiazem (CARDIZEM) 120 MG tablet Take 1 tablet (120 mg total) by mouth 2 (two) times daily. Patient taking differently: Take 120 mg by mouth 4 (four) times daily. 09/30/20  Yes Jennye Boroughs, MD  hydrochlorothiazide (HYDRODIURIL) 25 MG tablet Take 25 mg by mouth daily.   Yes [provider]  ibuprofen (ADVIL) 200 MG tablet Take 400 mg by mouth at bedtime.   Yes [provider]  ipratropium-albuterol (DUONEB) 0.5-2.5 (3) MG/3ML SOLN Take 3 mLs by nebulization every 6 (six) hours as needed. 09/30/20  Yes Jennye Boroughs, MD  nicotine (NICODERM CQ - DOSED IN MG/24 HOURS) 21  mg/24hr patch Place 21 mg onto the skin daily.   Yes [provider]  pantoprazole (PROTONIX) 20 MG tablet Take 1 tablet (20 mg total) by mouth daily. 09/30/20  Yes Jennye Boroughs, MD  tiotropium (SPIRIVA) 18 MCG inhalation capsule Place 18 mcg into inhaler and inhale daily.   Yes [provider]  acetaminophen (TYLENOL) 325 MG tablet Take 2 tablets (650 mg total) by  mouth every 6 (six) hours as needed for mild pain (or Fever >/= 101). Patient not taking: Reported on 10/19/2020 08/06/15   Dustin Flock, MD  montelukast (SINGULAIR) 10 MG tablet Take 1 tablet (10 mg total) by mouth at bedtime. Patient not taking: No sig reported 09/30/20   Jennye Boroughs, MD   Allergies  Allergen Reactions   Penicillins Anaphylaxis, Swelling and Other (See Comments)    Tolerated Ceftriaxone 06/2020 05/2020 childhood reaction and his mother told him that he "swelled up."  Had a PCN reaction causing immediate rash, facial/tongue/throat swelling, SOB or lightheadedness with hypotension: Yes Had a PCN reaction causing severe rash involving mucus membranes or skin necrosis: No Had a PCN reaction that required hospitalization No Had a PCN reaction occurring within the last 10 years: No If all the above answers are "NO", may proceed with Cephalosporin use.   Review of Systems  Respiratory:  Positive for shortness of breath.    Physical Exam Pulmonary:     Comments: Some WOB noted.  Neurological:     Mental Status: He is alert.    Vital Signs: BP 125/64 (BP Location: Right Arm)   Pulse 76   Temp 97.6 F (36.4 C)   Resp 18   Ht 6' (1.829 m)   Wt 77.4 kg   SpO2 94%   BMI 23.15 kg/m  Pain Scale: 0-10 POSS *See Group Information*: 1-Acceptable,Awake and alert Pain Score: 0-No pain   SpO2: SpO2: 94 % O2 Device:SpO2: 94 % O2 Flow Rate: .O2 Flow Rate (L/min): 4 L/min  IO: Intake/output summary:  Intake/Output Summary (Last 24 hours) at 10/22/2020 1616 Last data filed at 10/22/2020  1525 Gross per 24 hour  Intake 1140 ml  Output 1370 ml  Net -230 ml    LBM: Last BM Date: 10/21/20 Baseline Weight: Weight: 72.1 kg Most recent weight: Weight: 77.4 kg       Time In: 3:55 Time Out: 4:25 Time Total: 30 min Greater than 50%  of this time was spent counseling and coordinating care related to the above assessment and plan.  Signed by: Asencion Gowda, NP   Please contact Palliative Medicine Team phone at [phone] for questions and concerns.  For individual provider: See Shea Evans

## 2020-10-22 NOTE — TOC Progression Note (Signed)
Transition of Care [facility]) - Progression Note    Patient Details  Name: Jeremiah [name] MRN: [id number] Date of Birth: Dec 03, 1950  Transition [facility]) CM/SW Contact  Kerin Salen, RN Phone Number: 10/22/2020, 2:39 PM  Clinical Narrative:  [name] will arrange outpatient Palliative Care, [name] notified, per Attending order.    Expected Discharge Plan: Home/Self Care Barriers to Discharge: Continued Medical Work up  Expected Discharge Plan and Services Expected Discharge Plan: Home/Self Care In-house Referral: Clinical Social Work   Post Acute Care Choice: NA Living arrangements for the past 2 months: Single Family Home                                       Social Determinants of Health (SDOH) Interventions    Readmission Risk Interventions Readmission Risk Prevention Plan 10/21/2020 09/27/2020 06/17/2020  Transportation Screening Complete Complete Complete  PCP or Specialist Appt within 3-5 Days Complete Complete Complete  HRI or Home Care Consult Complete Complete Complete  Social Work Consult for [place] Planning/Counseling Complete Not Complete Complete  SW consult not completed comments - RNCM assigned to patient -  Palliative Care Screening Not Applicable Not Applicable Not Applicable  Medication Review (RN Care Manager) Complete Complete Complete  Some recent data might be hidden

## 2020-10-23 LAB — CULTURE, BLOOD (ROUTINE X 2)
Culture: NO GROWTH
Culture: NO GROWTH
Special Requests: ADEQUATE
Special Requests: ADEQUATE

## 2020-10-23 MED ORDER — TRAMADOL HCL 50 MG PO TABS: 50.0000 mg | ORAL_TABLET | Freq: Two times a day (BID) | ORAL | 0 refills | Status: DC | PRN

## 2020-10-23 MED ORDER — TRAMADOL HCL 50 MG PO TABS: 50.0000 mg | ORAL_TABLET | Freq: Two times a day (BID) | ORAL | 0 refills | Status: AC | PRN

## 2020-10-23 MED ORDER — NICOTINE POLACRILEX 2 MG MT GUM: 2.0000 mg | CHEWING_GUM | OROMUCOSAL | 0 refills | Status: DC | PRN

## 2020-10-23 MED ORDER — PREDNISONE 10 MG PO TABS: ORAL_TABLET | ORAL | 0 refills | Status: DC

## 2020-10-23 MED ORDER — GUAIFENESIN-DM 100-10 MG/5ML PO SYRP
5.0000 mL | ORAL_SOLUTION | ORAL | Status: DC | PRN
  Administered 2020-10-23: 5 mL via ORAL
  Filled 2020-10-23: qty 5

## 2020-10-23 MED ORDER — NICOTINE 21 MG/24HR TD PT24: 21.0000 mg | MEDICATED_PATCH | Freq: Every day | TRANSDERMAL | 0 refills | Status: DC

## 2020-10-23 MED ORDER — IPRATROPIUM-ALBUTEROL 0.5-2.5 (3) MG/3ML IN SOLN
3.0000 mL | Freq: Three times a day (TID) | RESPIRATORY_TRACT | Status: DC
  Administered 2020-10-23 (×2): 3 mL via RESPIRATORY_TRACT
  Filled 2020-10-23 (×2): qty 3

## 2020-10-23 NOTE — Discharge Summary (Signed)
Physician Discharge Summary  Jeremiah [name] RCV:[id number] DOB: 01-14-1951 DOA: 10/18/2020  PCP: [place] date: 10/18/2020 Discharge date: 10/23/2020  Admitted From: home Discharge disposition: home   Recommendations for Outpatient Follow-Up:   Palliative care Smoking cessation BMP 1 week   Discharge Diagnosis:   Principal Problem:   Acute on chronic respiratory failure with hypoxia ([place]) Active Problems:   Tobacco dependency   Hyponatremia   HTN (hypertension)   Abnormal LFTs   Anemia   Acute and chronic respiratory failure (acute-on-chronic) ([place])    Discharge Condition: Improved.  Diet recommendation: Low sodium, heart healthy  Wound care: None.  Code status: Full.   History of Present Illness:   Jeremiah [name] is a 70 y.o. male seen in ed with complaints of presenting to the emergency room with complaints of shortness of breath on 2 L of oxygen at home that he wears chronically at home for his COPD but his shortness of breath has been progressive and getting worse.  Per EMS patient was hypoxic at 72% on 2 L and was started on a nonrebreather, here patient was 10 transition to NIPPV.  Patient denies any cough chest pain palpitations headaches blurred vision dizziness abdominal issues urinary or bladder issues any gait or neurological issues.  And review of systems is negative.  Patient received Solu-Medrol in the EMS along with DuoNeb therapy prior to arrival.Pt states sob has been going on for past few days worse since past 24 and worse with exertion and laying flat.    Pt has past medical history of asthma, chronic respiratory failure on 2 L of home oxygen secondary to COPD, GERD, hypertension, tobacco abuse.     Hospital Course by Problem:   Acute on chronic hypoxic respiratory failure due to COPD exacerbation in the setting of lobar pneumonia in the right middle lung  -continue supportive care with Levaquin,  steroids, nebulizers -Seems to be improving, currently on 2.5 L, wean off to home 2 L as tolerated -- continue steroids and PRN bronchodilators   Hyponatremia-in the setting of poor p.o. intake at home prior to admission as well as HCTZ use and severe emphysema --received IV fluids, sodium improved from 118--->127 this morning -- sodium improved to 127. Mentation is clear.   Tobacco use-nicotine patch, he was counseled for cessation  Hyperlipidemia-continue statin  Essential hypertension-continue home diltiazem, blood pressure controlled   Lower extremity swelling-chronic, per patient worsening during the day and improving upon laying flat.  Most recent 2D echo in February 2022 showed EF 10-25% with diastolic parameters indeterminate.  RV was normal.   -Likely due to chronic venous insufficiency.      Medical Consultants:   Palliative care   Discharge Exam:   Vitals:   10/23/20 0743 10/23/20 0754  BP: 125/79   Pulse: 72   Resp: 18   Temp: 97.9 F (36.6 C)   SpO2: 97% 95%   Vitals:   10/23/20 0100 10/23/20 0436 10/23/20 0743 10/23/20 0754  BP:  115/63 125/79   Pulse:  73 72   Resp:  19 18   Temp:  97.9 F (36.6 C) 97.9 F (36.6 C)   TempSrc:  Oral Oral   SpO2: 97% 95% 97% 95%  Weight:      Height:        General exam: Appears calm and comfortable.    The results of significant diagnostics from this hospitalization (including imaging, microbiology, ancillary and laboratory) are  listed below for reference.     Procedures and Diagnostic Studies:   CT Angio Chest Pulmonary Embolism (PE) W or WO Contrast  Result Date: 10/18/2020 CLINICAL DATA:  Respiratory failure. Query cancer versus pulmonary embolus. Shortness of breath. EXAM: CT ANGIOGRAPHY CHEST WITH CONTRAST TECHNIQUE: Multidetector CT imaging of the chest was performed using the standard protocol during bolus administration of intravenous contrast. Multiplanar CT image reconstructions and MIPs were obtained  to evaluate the vascular anatomy. CONTRAST:  22mL OMNIPAQUE IOHEXOL 350 MG/ML SOLN COMPARISON:  08/04/2015 FINDINGS: Cardiovascular: Satisfactory opacification of the pulmonary arteries to the segmental level. No evidence of pulmonary embolism. Normal heart size. No pericardial effusion. Normal caliber thoracic aorta. No aortic dissection. Coronary artery and aortic calcifications. Mediastinum/Nodes: Moderately prominent pretracheal and subcarinal lymph nodes as well as right hilar lymph nodes. Largest nodes measure about 1.8 cm diameter. Lymph nodes are increased in size since prior study. Esophagus is decompressed. Lungs/Pleura: Severe diffuse emphysematous changes and scattered fibrosis in the lungs. Suggestion of superimposed consolidation in the right middle lung, possibly pneumonia although an endobronchial lesion with postobstructive change could have this appearance as well. Upper Abdomen: No acute abnormalities demonstrated in the visualized upper abdomen. Musculoskeletal: Degenerative changes in the spine. No destructive bone lesions identified. Motion artifact limits examination. Review of the MIP images confirms the above findings. IMPRESSION: 1. No evidence of significant pulmonary embolus. 2. Diffuse emphysematous changes throughout the lungs. 3. Focal consolidation in the right middle lung likely represents pneumonia but follow-up after resolution of acute process is recommended to exclude centrally obstructing lesion. 4. Mild but increasing lymphadenopathy in the mediastinum and right hilum since prior study. Electronically Signed   By: Lucienne Capers M.D.   On: 10/18/2020 22:10   DG Chest Port 1 View  Result Date: 10/18/2020 CLINICAL DATA:  Shortness of breath EXAM: PORTABLE CHEST 1 VIEW COMPARISON:  09/26/2020 FINDINGS: There is hyperinflation of the lungs compatible with COPD. Diffuse interstitial prominence throughout the lungs compatible with chronic lung disease/fibrosis. Increasing  opacity at the right lung base could reflect superimposed infiltrate. Heart is normal size. No effusions or acute bony abnormality. IMPRESSION: COPD/changes of fibrosis. Increasing opacity at the right lung base again noted concerning for superimposed infiltrate. Electronically Signed   By: Rolm Baptise M.D.   On: 10/18/2020 19:47     Labs:   Basic Metabolic Panel: Recent Labs  Lab 10/18/20 1930 10/19/20 0445 10/19/20 1802 10/20/20 0506  NA 118* 123* 128* 127*  K 3.7 4.4 3.5 3.8  CL 87* 90* 95* 95*  CO2 23 24 27 29   GLUCOSE 116* 170* 210* 152*  BUN 10 11 17 19   CREATININE 0.48* 0.60* 0.72 0.57*  CALCIUM 8.3* 8.6* 8.5* 8.5*   GFR Estimated Creatinine Clearance: 94.1 mL/min (A) (by C-G formula based on SCr of 0.57 mg/dL (L)). Liver Function Tests: Recent Labs  Lab 10/18/20 1930 10/19/20 0445 10/20/20 0506  AST 22 24 20   ALT 15 14 14   ALKPHOS 134* 129* 138*  BILITOT 0.8 0.6 0.4  PROT 6.9 6.6 6.2*  ALBUMIN 3.5 3.2* 3.0*   No results for input(s): LIPASE, AMYLASE in the last 168 hours. No results for input(s): AMMONIA in the last 168 hours. Coagulation profile No results for input(s): INR, PROTIME in the last 168 hours.  CBC: Recent Labs  Lab 10/18/20 1930 10/19/20 0445 10/20/20 0506  WBC 8.9 3.5* 10.8*  HGB 11.7* 11.8* 10.9*  HCT 33.5* 33.3* 31.4*  MCV 83.1 82.0 85.1  PLT 277  287 305   Cardiac Enzymes: No results for input(s): CKTOTAL, CKMB, CKMBINDEX, TROPONINI in the last 168 hours. BNP: Invalid input(s): POCBNP CBG: No results for input(s): GLUCAP in the last 168 hours. D-Dimer No results for input(s): DDIMER in the last 72 hours. Hgb A1c No results for input(s): HGBA1C in the last 72 hours. Lipid Profile No results for input(s): CHOL, HDL, LDLCALC, TRIG, CHOLHDL, LDLDIRECT in the last 72 hours. Thyroid function studies No results for input(s): TSH, T4TOTAL, T3FREE, THYROIDAB in the last 72 hours.  Invalid input(s): FREET3 Anemia work up No  results for input(s): VITAMINB12, FOLATE, FERRITIN, TIBC, IRON, RETICCTPCT in the last 72 hours. Microbiology Recent Results (from the past 240 hour(s))  Resp Panel by RT-PCR (Flu A&B, Covid) Nasopharyngeal Swab     Status: None   Collection Time: 10/18/20  7:30 PM   Specimen: Nasopharyngeal Swab; Nasopharyngeal(NP) swabs in vial transport medium  Result Value Ref Range Status   SARS Coronavirus 2 by RT PCR NEGATIVE NEGATIVE Final    Comment: (NOTE) SARS-CoV-2 target nucleic acids are NOT DETECTED.  The SARS-CoV-2 RNA is generally detectable in upper respiratory specimens during the acute phase of infection. The lowest concentration of SARS-CoV-2 viral copies this assay can detect is 138 copies/mL. A negative result does not preclude SARS-Cov-2 infection and should not be used as the sole basis for treatment or other patient management decisions. A negative result may occur with  improper specimen collection/handling, submission of specimen other than nasopharyngeal swab, presence of viral mutation(s) within the areas targeted by this assay, and inadequate number of viral copies(<138 copies/mL). A negative result must be combined with clinical observations, patient history, and epidemiological information. The expected result is Negative.  Fact Sheet for Patients:  EntrepreneurPulse.com.au  Fact Sheet for Healthcare Providers:  IncredibleEmployment.be  This test is no t yet approved or cleared by the [place] FDA and  has been authorized for detection and/or diagnosis of SARS-CoV-2 by FDA under an Emergency Use Authorization (EUA). This EUA will remain  in effect (meaning this test can be used) for the duration of the COVID-19 declaration under Section 564(b)(1) of the Act, 21 U.S.C.section 360bbb-3(b)(1), unless the authorization is terminated  or revoked sooner.       Influenza A by PCR NEGATIVE NEGATIVE Final   Influenza B by PCR  NEGATIVE NEGATIVE Final    Comment: (NOTE) The Xpert Xpress SARS-CoV-2/FLU/RSV plus assay is intended as an aid in the diagnosis of influenza from Nasopharyngeal swab specimens and should not be used as a sole basis for treatment. Nasal washings and aspirates are unacceptable for Xpert Xpress SARS-CoV-2/FLU/RSV testing.  Fact Sheet for Patients: EntrepreneurPulse.com.au  Fact Sheet for Healthcare Providers: IncredibleEmployment.be  This test is not yet approved or cleared by the [place] FDA and has been authorized for detection and/or diagnosis of SARS-CoV-2 by FDA under an Emergency Use Authorization (EUA). This EUA will remain in effect (meaning this test can be used) for the duration of the COVID-19 declaration under Section 564(b)(1) of the Act, 21 U.S.C. section 360bbb-3(b)(1), unless the authorization is terminated or revoked.  Performed at [facility], [address] [place], [place] [address]   Blood culture (routine x 2)     Status: None   Collection Time: 10/18/20  8:43 PM   Specimen: BLOOD  Result Value Ref Range Status   Specimen Description BLOOD BLOOD RIGHT WRIST  Final   Special Requests   Final    BOTTLES DRAWN AEROBIC AND  ANAEROBIC Blood Culture adequate volume   Culture   Final    NO GROWTH 5 DAYS Performed at [facility], [address] [place], [place] [address]    Report Status 10/23/2020 FINAL  Final  Blood culture (routine x 2)     Status: None   Collection Time: 10/18/20  9:01 PM   Specimen: BLOOD  Result Value Ref Range Status   Specimen Description BLOOD LEFT ANTECUBITAL  Final   Special Requests   Final    BOTTLES DRAWN AEROBIC AND ANAEROBIC Blood Culture adequate volume   Culture   Final    NO GROWTH 5 DAYS Performed at [facility], [address], [place] [address], [place] [address]    Report Status 10/23/2020 FINAL  Final     Discharge Instructions:   Discharge  Instructions     Diet general   Complete by: As directed    Discharge instructions   Complete by: As directed    Resume home O2 Outpatient palliative services Home health Stop smoking   Increase activity slowly   Complete by: As directed       Allergies as of 10/23/2020       Reactions   Penicillins Anaphylaxis, Swelling, Other (See Comments)   Tolerated Ceftriaxone 06/2020 05/2020 childhood reaction and his mother told him that he "swelled up." Had a PCN reaction causing immediate rash, facial/tongue/throat swelling, SOB or lightheadedness with hypotension: Yes Had a PCN reaction causing severe rash involving mucus membranes or skin necrosis: No Had a PCN reaction that required hospitalization No Had a PCN reaction occurring within the last 10 years: No If all the above answers are "NO", may proceed with Cephalosporin use.        Medication List     STOP taking these medications    hydrochlorothiazide 25 MG tablet Commonly known as: HYDRODIURIL   ibuprofen 200 MG tablet Commonly known as: ADVIL       TAKE these medications    acetaminophen 325 MG tablet Commonly known as: TYLENOL Take 2 tablets (650 mg total) by mouth every 6 (six) hours as needed for mild pain (or Fever >/= 101).   albuterol 108 (90 Base) MCG/ACT inhaler Commonly known as: VENTOLIN HFA Inhale into the lungs every 6 (six) hours as needed for wheezing or shortness of breath.   atorvastatin 40 MG tablet Commonly known as: LIPITOR Take 40 mg by mouth every evening.   Breo Ellipta 100-25 MCG/INH Aepb Generic drug: fluticasone furoate-vilanterol Inhale 1 puff into the lungs daily.   diltiazem 120 MG tablet Commonly known as: Cardizem Take 1 tablet (120 mg total) by mouth 2 (two) times daily. What changed: when to take this   ipratropium-albuterol 0.5-2.5 (3) MG/3ML Soln Commonly known as: DUONEB Take 3 mLs by nebulization every 6 (six) hours as needed.   montelukast 10 MG  tablet Commonly known as: SINGULAIR Take 1 tablet (10 mg total) by mouth at bedtime.   nicotine 21 mg/24hr patch Commonly known as: NICODERM CQ - dosed in mg/24 hours Place 1 patch (21 mg total) onto the skin daily.   nicotine polacrilex 2 MG gum Commonly known as: NICORETTE Take 1 each (2 mg total) by mouth as needed for smoking cessation.   pantoprazole 20 MG tablet Commonly known as: PROTONIX Take 1 tablet (20 mg total) by mouth daily.   predniSONE 10 MG tablet Commonly known as: DELTASONE 40 mg x 2 days then 30 mg x 2 days then 20 mg x 2  days then 10 mg x 2 days then stop   tiotropium 18 MCG inhalation capsule Commonly known as: SPIRIVA Place 18 mcg into inhaler and inhale daily.   traMADol 50 MG tablet Commonly known as: ULTRAM Take 1 tablet (50 mg total) by mouth every 12 (twelve) hours as needed for up to 3 days for moderate pain or severe pain.        Follow-up [place] Follow up in 1 week(s).   Contact information: [place] [address] [phone]                  Time coordinating discharge: 35 min  Signed:  Geradine Girt DO  [facility] 10/23/2020, 10:01 AM

## 2020-10-23 NOTE — TOC Progression Note (Signed)
Transition of Care [facility]) - Progression Note    Patient Details  Name: Jeremiah [name] MRN: [id number] Date of Birth: 1950/09/26  [facility]) CM/SW Contact  Jeremiah Price, RN Phone Number: 10/23/2020, 10:42 AM  Clinical Narrative:  Patient is being discharged today. Prior CM notes indicate patient has indicated he does not want/need [facility] services.    "CSW spoke with pt and pt states he doesn't need HH. Pt states if he needs help after he gets home he will reach out. Pt states he lives with friends who help him. Pt states he has a walker at home. Pt was requesting a pulse ox--provided to pt at bedside. Pt states his friends take him to his doctor appointments. Pt states he goes to [facility] and also gets him meds from there."    CM 10/21/20 Progression note.  Provider notified of this communication. Jeremiah Davies RN CM       Expected Discharge Plan: Home/Self Care Barriers to Discharge: Continued Medical Work up  Expected Discharge Plan and Services Expected Discharge Plan: Home/Self Care In-house Referral: Clinical Social Work   Post Acute Care Choice: NA Living arrangements for the past 2 months: Single Family Home Expected Discharge Date: 10/23/20                                     Social Determinants of Health (SDOH) Interventions    Readmission Risk Interventions Readmission Risk Prevention Plan 10/21/2020 09/27/2020 06/17/2020  Transportation Screening Complete Complete Complete  PCP or Specialist Appt within 3-5 Days Complete Complete Complete  HRI or Home Care Consult Complete Complete Complete  Social Work Consult for [place] Planning/Counseling Complete Not Complete Complete  SW consult not completed comments - RNCM assigned to patient -  Palliative Care Screening Not Applicable Not Applicable Not Applicable  Medication Review (RN Care Manager) Complete Complete Complete  Some recent data might be hidden

## 2020-10-23 NOTE — TOC Transition Note (Addendum)
Transition of Care [facility]) - CM/SW Discharge Note   Patient Details  Name: Jeremiah [name] MRN: [id number] Date of Birth: Jan 18, 1951  [facility]) CM/SW Contact:  Izola Price, RN Phone Number: 10/23/2020, 10:46 AM   Clinical Narrative:   Patient to be discharged Home/Self Care today. Prior CN note on 6/16, indicated patient decline HH services, has equipment needed, and transportation via friends to appointment. Information passed on to discharging provider. Simmie Davies RN CM   Unit RN notified RN CM that patient has home oxygen but is out of transport tanks. Contacting [facility] too see about transport tanks for transport to home at discharge. Simmie Davies RN CM    [facility] at [phone] returned call and will check on service provider and text me when transport tanks on the way to [name]. Unit Rn updated. Simmie Davies RN CM 6294 am.   1250 [facility] will deliver transport oxygen to home. Provider aware new DME oxygen flow orders needed. Unit RN updated. Simmie Davies RN CM    Final next level of care: Home/Self Care Barriers to Discharge: Barriers Resolved   Patient Goals and CMS Choice     Choice offered to / list presented to : Patient  Discharge Placement                       Discharge Plan and Services In-house Referral: Clinical Social Work   Post Acute Care Choice: NA          DME Arranged: N/A DME Agency: NA       HH Arranged: NA HH Agency: NA (Patient declined [place] services on 10/21/20 per CM prog notes.)        Social Determinants of Health (SDOH) Interventions     Readmission Risk Interventions Readmission Risk Prevention Plan 10/21/2020 09/27/2020 06/17/2020  Transportation Screening Complete Complete Complete  PCP or Specialist Appt within 3-5 Days Complete Complete Complete  HRI or Home Care Consult Complete Complete Complete  Social Work Consult for [place] Planning/Counseling Complete Not Complete Complete  SW consult not  completed comments - RNCM assigned to patient -  Palliative Care Screening Not Applicable Not Applicable Not Applicable  Medication Review (RN Care Manager) Complete Complete Complete  Some recent data might be hidden

## 2020-10-29 ENCOUNTER — Telehealth: Payer: Self-pay | Admitting: Primary Care

## 2020-10-29 NOTE — Telephone Encounter (Signed)
Spoke with patient regarding the Palliative referral/services and he has declined services at this time.  He requested that I speak with his friend, [name] and give her our contact number so if they need our services in the future.  Spoke with [name] and gave her our main Palliative phone number and told her that she could either call us or patient's PCP if they decide they need our services in the future and she was in agreement with this.

## 2021-05-28 ENCOUNTER — Emergency Department: Payer: Medicare HMO

## 2021-05-28 ENCOUNTER — Encounter: Payer: Self-pay | Admitting: Emergency Medicine

## 2021-05-28 ENCOUNTER — Other Ambulatory Visit: Payer: Self-pay

## 2021-05-28 ENCOUNTER — Inpatient Hospital Stay
Admission: EM | Admit: 2021-05-28 | Discharge: 2021-06-03 | DRG: 190 | Disposition: A | Discharge status: Skilled Nursing Facility | Payer: Medicare HMO | Attending: Internal Medicine | Admitting: Internal Medicine

## 2021-05-28 ENCOUNTER — Inpatient Hospital Stay: Payer: Medicare HMO

## 2021-05-28 DIAGNOSIS — G43909 Migraine, unspecified, not intractable, without status migrainosus: Secondary | ICD-10-CM | POA: Diagnosis present

## 2021-05-28 DIAGNOSIS — J441 Chronic obstructive pulmonary disease with (acute) exacerbation: Secondary | ICD-10-CM

## 2021-05-28 DIAGNOSIS — E440 Moderate protein-calorie malnutrition: Secondary | ICD-10-CM | POA: Diagnosis present

## 2021-05-28 DIAGNOSIS — Z961 Presence of intraocular lens: Secondary | ICD-10-CM | POA: Diagnosis present

## 2021-05-28 DIAGNOSIS — Z6823 Body mass index (BMI) 23.0-23.9, adult: Secondary | ICD-10-CM | POA: Diagnosis not present

## 2021-05-28 DIAGNOSIS — J9601 Acute respiratory failure with hypoxia: Secondary | ICD-10-CM | POA: Diagnosis present

## 2021-05-28 DIAGNOSIS — J439 Emphysema, unspecified: Principal | ICD-10-CM | POA: Diagnosis present

## 2021-05-28 DIAGNOSIS — Z9842 Cataract extraction status, left eye: Secondary | ICD-10-CM | POA: Diagnosis not present

## 2021-05-28 DIAGNOSIS — Z9841 Cataract extraction status, right eye: Secondary | ICD-10-CM

## 2021-05-28 DIAGNOSIS — E871 Hypo-osmolality and hyponatremia: Secondary | ICD-10-CM | POA: Diagnosis present

## 2021-05-28 DIAGNOSIS — J449 Chronic obstructive pulmonary disease, unspecified: Secondary | ICD-10-CM | POA: Diagnosis present

## 2021-05-28 DIAGNOSIS — I10 Essential (primary) hypertension: Secondary | ICD-10-CM | POA: Diagnosis present

## 2021-05-28 DIAGNOSIS — Z79899 Other long term (current) drug therapy: Secondary | ICD-10-CM

## 2021-05-28 DIAGNOSIS — I872 Venous insufficiency (chronic) (peripheral): Secondary | ICD-10-CM | POA: Diagnosis present

## 2021-05-28 DIAGNOSIS — J189 Pneumonia, unspecified organism: Secondary | ICD-10-CM

## 2021-05-28 DIAGNOSIS — E875 Hyperkalemia: Secondary | ICD-10-CM | POA: Diagnosis present

## 2021-05-28 DIAGNOSIS — F17200 Nicotine dependence, unspecified, uncomplicated: Secondary | ICD-10-CM | POA: Diagnosis not present

## 2021-05-28 DIAGNOSIS — Z20822 Contact with and (suspected) exposure to covid-19: Secondary | ICD-10-CM | POA: Diagnosis present

## 2021-05-28 DIAGNOSIS — Z9981 Dependence on supplemental oxygen: Secondary | ICD-10-CM | POA: Diagnosis not present

## 2021-05-28 DIAGNOSIS — F17210 Nicotine dependence, cigarettes, uncomplicated: Secondary | ICD-10-CM | POA: Diagnosis present

## 2021-05-28 DIAGNOSIS — Z8249 Family history of ischemic heart disease and other diseases of the circulatory system: Secondary | ICD-10-CM | POA: Diagnosis not present

## 2021-05-28 DIAGNOSIS — D649 Anemia, unspecified: Secondary | ICD-10-CM | POA: Diagnosis present

## 2021-05-28 DIAGNOSIS — J069 Acute upper respiratory infection, unspecified: Secondary | ICD-10-CM | POA: Diagnosis present

## 2021-05-28 DIAGNOSIS — J9620 Acute and chronic respiratory failure, unspecified whether with hypoxia or hypercapnia: Secondary | ICD-10-CM | POA: Diagnosis present

## 2021-05-28 DIAGNOSIS — Z880 Allergy status to penicillin: Secondary | ICD-10-CM | POA: Diagnosis not present

## 2021-05-28 DIAGNOSIS — J9621 Acute and chronic respiratory failure with hypoxia: Secondary | ICD-10-CM | POA: Diagnosis present

## 2021-05-28 DIAGNOSIS — R911 Solitary pulmonary nodule: Secondary | ICD-10-CM | POA: Diagnosis present

## 2021-05-28 DIAGNOSIS — K219 Gastro-esophageal reflux disease without esophagitis: Secondary | ICD-10-CM | POA: Diagnosis present

## 2021-05-28 DIAGNOSIS — E8729 Other acidosis: Secondary | ICD-10-CM | POA: Diagnosis present

## 2021-05-28 DIAGNOSIS — B9789 Other viral agents as the cause of diseases classified elsewhere: Secondary | ICD-10-CM | POA: Diagnosis present

## 2021-05-28 DIAGNOSIS — R0602 Shortness of breath: Secondary | ICD-10-CM

## 2021-05-28 DIAGNOSIS — J9622 Acute and chronic respiratory failure with hypercapnia: Secondary | ICD-10-CM | POA: Diagnosis present

## 2021-05-28 DIAGNOSIS — Z7952 Long term (current) use of systemic steroids: Secondary | ICD-10-CM

## 2021-05-28 DIAGNOSIS — R5381 Other malaise: Secondary | ICD-10-CM | POA: Diagnosis not present

## 2021-05-28 DIAGNOSIS — E876 Hypokalemia: Secondary | ICD-10-CM | POA: Diagnosis not present

## 2021-05-28 DIAGNOSIS — B348 Other viral infections of unspecified site: Secondary | ICD-10-CM | POA: Diagnosis not present

## 2021-05-28 LAB — CBC WITH DIFFERENTIAL/PLATELET
Abs Immature Granulocytes: 0.11 10*3/uL — ABNORMAL HIGH (ref 0.00–0.07)
Basophils Absolute: 0.1 10*3/uL (ref 0.0–0.1)
Basophils Relative: 0 %
Eosinophils Absolute: 0 10*3/uL (ref 0.0–0.5)
Eosinophils Relative: 0 %
HCT: 35.9 % — ABNORMAL LOW (ref 39.0–52.0)
Hemoglobin: 11.5 g/dL — ABNORMAL LOW (ref 13.0–17.0)
Immature Granulocytes: 1 %
Lymphocytes Relative: 4 %
Lymphs Abs: 0.7 10*3/uL (ref 0.7–4.0)
MCH: 27.6 pg (ref 26.0–34.0)
MCHC: 32 g/dL (ref 30.0–36.0)
MCV: 86.1 fL (ref 80.0–100.0)
Monocytes Absolute: 0.9 10*3/uL (ref 0.1–1.0)
Monocytes Relative: 4 %
Neutro Abs: 17.6 10*3/uL — ABNORMAL HIGH (ref 1.7–7.7)
Neutrophils Relative %: 91 %
Platelets: 369 10*3/uL (ref 150–400)
RBC: 4.17 MIL/uL — ABNORMAL LOW (ref 4.22–5.81)
RDW: 17.2 % — ABNORMAL HIGH (ref 11.5–15.5)
WBC: 19.3 10*3/uL — ABNORMAL HIGH (ref 4.0–10.5)
nRBC: 0 % (ref 0.0–0.2)

## 2021-05-28 LAB — COMPREHENSIVE METABOLIC PANEL
ALT: 18 U/L (ref 0–44)
AST: 27 U/L (ref 15–41)
Albumin: 3.6 g/dL (ref 3.5–5.0)
Alkaline Phosphatase: 219 U/L — ABNORMAL HIGH (ref 38–126)
Anion gap: 10 (ref 5–15)
BUN: 14 mg/dL (ref 8–23)
CO2: 31 mmol/L (ref 22–32)
Calcium: 8.9 mg/dL (ref 8.9–10.3)
Chloride: 83 mmol/L — ABNORMAL LOW (ref 98–111)
Creatinine, Ser: 0.4 mg/dL — ABNORMAL LOW (ref 0.61–1.24)
GFR, Estimated: >60 mL/min (ref >60)
Glucose, Bld: 81 mg/dL (ref 70–99)
Potassium: 4.8 mmol/L (ref 3.5–5.1)
Sodium: 124 mmol/L — ABNORMAL LOW (ref 135–145)
Total Bilirubin: 0.7 mg/dL (ref 0.3–1.2)
Total Protein: 8.2 g/dL — ABNORMAL HIGH (ref 6.5–8.1)

## 2021-05-28 LAB — PROCALCITONIN: Procalcitonin: <0.1 ng/mL

## 2021-05-28 LAB — CBC
HCT: 34.4 % — ABNORMAL LOW (ref 39.0–52.0)
Hemoglobin: 11.3 g/dL — ABNORMAL LOW (ref 13.0–17.0)
MCH: 28 pg (ref 26.0–34.0)
MCHC: 32.8 g/dL (ref 30.0–36.0)
MCV: 85.1 fL (ref 80.0–100.0)
Platelets: 367 10*3/uL (ref 150–400)
RBC: 4.04 MIL/uL — ABNORMAL LOW (ref 4.22–5.81)
RDW: 17 % — ABNORMAL HIGH (ref 11.5–15.5)
WBC: 17.6 10*3/uL — ABNORMAL HIGH (ref 4.0–10.5)
nRBC: 0 % (ref 0.0–0.2)

## 2021-05-28 LAB — BLOOD GAS, ARTERIAL
Acid-Base Excess: 6.7 mmol/L — ABNORMAL HIGH (ref 0.0–2.0)
Acid-Base Excess: 7.4 mmol/L — ABNORMAL HIGH (ref 0.0–2.0)
Bicarbonate: 34.5 mmol/L — ABNORMAL HIGH (ref 20.0–28.0)
Bicarbonate: 35.1 mmol/L — ABNORMAL HIGH (ref 20.0–28.0)
Delivery systems: POSITIVE
Delivery systems: POSITIVE
Expiratory PAP: 6
Expiratory PAP: 6
FIO2: 0.4
FIO2: 0.32
Inspiratory PAP: 12
Inspiratory PAP: 12
Mechanical Rate: 10
O2 Saturation: 92 %
O2 Saturation: 90.3 %
Patient temperature: 37
Patient temperature: 37
RATE: 12 resp/min
RATE: 10 resp/min
pCO2 arterial: 67 mmHg (ref 32.0–48.0)
pCO2 arterial: 65 mmHg — ABNORMAL HIGH (ref 32.0–48.0)
pH, Arterial: 7.32 — ABNORMAL LOW (ref 7.350–7.450)
pH, Arterial: 7.34 — ABNORMAL LOW (ref 7.350–7.450)
pO2, Arterial: 69 mmHg — ABNORMAL LOW (ref 83.0–108.0)
pO2, Arterial: 63 mmHg — ABNORMAL LOW (ref 83.0–108.0)

## 2021-05-28 LAB — RESP PANEL BY RT-PCR (FLU A&B, COVID) ARPGX2
Influenza A by PCR: NEGATIVE
Influenza B by PCR: NEGATIVE
SARS Coronavirus 2 by RT PCR: NEGATIVE

## 2021-05-28 LAB — CREATININE, SERUM
Creatinine, Ser: 0.5 mg/dL — ABNORMAL LOW (ref 0.61–1.24)
GFR, Estimated: >60 mL/min (ref >60)

## 2021-05-28 LAB — TROPONIN I (HIGH SENSITIVITY)
Troponin I (High Sensitivity): 10 ng/L (ref <18)
Troponin I (High Sensitivity): 11 ng/L (ref <18)

## 2021-05-28 LAB — BRAIN NATRIURETIC PEPTIDE: B Natriuretic Peptide: 101.8 pg/mL — ABNORMAL HIGH (ref 0.0–100.0)

## 2021-05-28 MED ORDER — IPRATROPIUM-ALBUTEROL 0.5-2.5 (3) MG/3ML IN SOLN
3.0000 mL | Freq: Four times a day (QID) | RESPIRATORY_TRACT | Status: DC
  Administered 2021-05-29 – 2021-05-31 (×12): 3 mL via RESPIRATORY_TRACT
  Filled 2021-05-28 (×12): qty 3

## 2021-05-28 MED ORDER — IPRATROPIUM-ALBUTEROL 0.5-2.5 (3) MG/3ML IN SOLN
3.0000 mL | Freq: Once | RESPIRATORY_TRACT | Status: AC
  Administered 2021-05-28: 3 mL via RESPIRATORY_TRACT

## 2021-05-28 MED ORDER — IPRATROPIUM-ALBUTEROL 0.5-2.5 (3) MG/3ML IN SOLN
3.0000 mL | Freq: Once | RESPIRATORY_TRACT | Status: AC
  Administered 2021-05-28: 3 mL via RESPIRATORY_TRACT
  Filled 2021-05-28: qty 3

## 2021-05-28 MED ORDER — HEPARIN SODIUM (PORCINE) 5000 UNIT/ML IJ SOLN
5000.0000 [IU] | Freq: Three times a day (TID) | INTRAMUSCULAR | Status: DC
  Administered 2021-05-28 – 2021-06-03 (×17): 5000 [IU] via SUBCUTANEOUS
  Filled 2021-05-28 (×17): qty 1

## 2021-05-28 MED ORDER — IPRATROPIUM-ALBUTEROL 0.5-2.5 (3) MG/3ML IN SOLN: 3.0000 mL | RESPIRATORY_TRACT | Status: DC | PRN

## 2021-05-28 MED ORDER — LACTATED RINGERS IV SOLN: INTRAVENOUS | Status: AC

## 2021-05-28 MED ORDER — PREDNISONE 20 MG PO TABS: 40.0000 mg | ORAL_TABLET | Freq: Every day | ORAL | Status: DC

## 2021-05-28 MED ORDER — ATORVASTATIN CALCIUM 20 MG PO TABS
40.0000 mg | ORAL_TABLET | Freq: Every evening | ORAL | Status: DC
  Administered 2021-05-28 – 2021-06-02 (×6): 40 mg via ORAL
  Filled 2021-05-28 (×6): qty 2

## 2021-05-28 MED ORDER — DILTIAZEM HCL 60 MG PO TABS
120.0000 mg | ORAL_TABLET | Freq: Two times a day (BID) | ORAL | Status: DC
  Administered 2021-05-29 – 2021-06-03 (×12): 120 mg via ORAL
  Filled 2021-05-28 (×14): qty 2

## 2021-05-28 MED ORDER — MONTELUKAST SODIUM 10 MG PO TABS
10.0000 mg | ORAL_TABLET | Freq: Every day | ORAL | Status: DC
  Administered 2021-05-28 – 2021-06-02 (×6): 10 mg via ORAL
  Filled 2021-05-28 (×6): qty 1

## 2021-05-28 MED ORDER — SODIUM CHLORIDE 0.9% FLUSH
3.0000 mL | Freq: Two times a day (BID) | INTRAVENOUS | Status: DC
  Administered 2021-05-28 – 2021-06-03 (×12): 3 mL via INTRAVENOUS

## 2021-05-28 MED ORDER — ACETAMINOPHEN 325 MG PO TABS
650.0000 mg | ORAL_TABLET | Freq: Four times a day (QID) | ORAL | Status: DC | PRN
  Administered 2021-05-29 – 2021-06-03 (×8): 650 mg via ORAL
  Filled 2021-05-28 (×8): qty 2

## 2021-05-28 MED ORDER — IOHEXOL 350 MG/ML SOLN
80.0000 mL | Freq: Once | INTRAVENOUS | Status: AC | PRN
  Administered 2021-05-28: 80 mL via INTRAVENOUS

## 2021-05-28 MED ORDER — ALBUTEROL SULFATE HFA 108 (90 BASE) MCG/ACT IN AERS: 1.0000 | INHALATION_SPRAY | Freq: Four times a day (QID) | RESPIRATORY_TRACT | Status: DC | PRN

## 2021-05-28 MED ORDER — ACETAMINOPHEN 650 MG RE SUPP: 650.0000 mg | Freq: Four times a day (QID) | RECTAL | Status: DC | PRN

## 2021-05-28 MED ORDER — PANTOPRAZOLE SODIUM 20 MG PO TBEC
20.0000 mg | DELAYED_RELEASE_TABLET | Freq: Every day | ORAL | Status: DC
  Administered 2021-05-29 – 2021-06-03 (×7): 20 mg via ORAL
  Filled 2021-05-28 (×8): qty 1

## 2021-05-28 MED ORDER — NICOTINE 14 MG/24HR TD PT24: 14.0000 mg | MEDICATED_PATCH | Freq: Every day | TRANSDERMAL | Status: DC

## 2021-05-28 MED ORDER — ZOLPIDEM TARTRATE 5 MG PO TABS: 5.0000 mg | ORAL_TABLET | Freq: Every evening | ORAL | Status: DC | PRN

## 2021-05-28 MED ORDER — THIAMINE HCL 100 MG PO TABS
100.0000 mg | ORAL_TABLET | Freq: Every day | ORAL | Status: DC
  Administered 2021-05-28 – 2021-06-03 (×7): 100 mg via ORAL
  Filled 2021-05-28 (×7): qty 1

## 2021-05-28 MED ORDER — NICOTINE 21 MG/24HR TD PT24
21.0000 mg | MEDICATED_PATCH | Freq: Every day | TRANSDERMAL | Status: DC
  Administered 2021-05-28 – 2021-06-03 (×7): 21 mg via TRANSDERMAL
  Filled 2021-05-28 (×7): qty 1

## 2021-05-28 MED ORDER — TIOTROPIUM BROMIDE MONOHYDRATE 18 MCG IN CAPS
18.0000 ug | ORAL_CAPSULE | Freq: Every day | RESPIRATORY_TRACT | Status: DC
  Administered 2021-05-29: 18 ug via RESPIRATORY_TRACT
  Filled 2021-05-28: qty 5

## 2021-05-28 MED ORDER — METHYLPREDNISOLONE SODIUM SUCC 125 MG IJ SOLR
60.0000 mg | Freq: Two times a day (BID) | INTRAMUSCULAR | Status: AC
  Administered 2021-05-28 – 2021-05-29 (×2): 60 mg via INTRAVENOUS
  Filled 2021-05-28 (×2): qty 2

## 2021-05-28 MED ORDER — HYDRALAZINE HCL 20 MG/ML IJ SOLN: 5.0000 mg | INTRAMUSCULAR | Status: DC | PRN

## 2021-05-28 MED ORDER — GUAIFENESIN ER 600 MG PO TB12
600.0000 mg | ORAL_TABLET | Freq: Two times a day (BID) | ORAL | Status: DC | PRN
  Administered 2021-05-28 – 2021-06-02 (×5): 600 mg via ORAL
  Filled 2021-05-28 (×5): qty 1

## 2021-05-28 MED ORDER — LEVOFLOXACIN IN D5W 750 MG/150ML IV SOLN
750.0000 mg | Freq: Once | INTRAVENOUS | Status: AC
  Administered 2021-05-28: 750 mg via INTRAVENOUS
  Filled 2021-05-28: qty 150

## 2021-05-28 MED ORDER — ALBUTEROL SULFATE (2.5 MG/3ML) 0.083% IN NEBU
2.5000 mg | INHALATION_SOLUTION | RESPIRATORY_TRACT | Status: DC | PRN
  Administered 2021-06-01 – 2021-06-02 (×2): 2.5 mg via RESPIRATORY_TRACT
  Filled 2021-05-28 (×2): qty 3

## 2021-05-28 MED ORDER — ALPRAZOLAM 0.25 MG PO TABS
0.2500 mg | ORAL_TABLET | Freq: Once | ORAL | Status: AC
  Administered 2021-05-29: 0.25 mg via ORAL
  Filled 2021-05-28: qty 1

## 2021-05-28 MED ORDER — FLUTICASONE FUROATE-VILANTEROL 100-25 MCG/ACT IN AEPB
1.0000 | INHALATION_SPRAY | Freq: Every day | RESPIRATORY_TRACT | Status: DC
  Administered 2021-05-29: 1 via RESPIRATORY_TRACT
  Filled 2021-05-28: qty 28

## 2021-05-28 MED ORDER — IPRATROPIUM-ALBUTEROL 0.5-2.5 (3) MG/3ML IN SOLN: 3.0000 mL | Freq: Four times a day (QID) | RESPIRATORY_TRACT | Status: DC | PRN

## 2021-05-28 NOTE — ED Triage Notes (Signed)
Pt to ED via [facility] from home for shortness of breath. EMS reports on their arrival pt in tripod position RR 45-55. Pt sats in the 80's. Skin purple and pt was confused. Pt has hx/o COPD. Pt was given 1 duoneb and 125 mg of solumedrol in route. Pt arrivals in distress, pt tachypneic and speaking in short sentences. EDP at bedside RT called for [facility].

## 2021-05-28 NOTE — ED Provider Notes (Signed)
[facility] Provider Note    Event Date/Time   First MD Initiated Contact with Patient 05/28/21 1626     (approximate)   History   Shortness of Breath   HPI  Jeremiah [name] is a 71 y.o. male who has a history of COPD and pneumonia.  He is smoking but has cut back.  He complains of several days of increasing shortness of breath with thick yellowish phlegm which is producing.  There is possibly a little bit more phlegm than usual.  EMS reports that he was running 82% on room air and went up to 88 and 90% on 4 L.  He became much more awake on 4 L.  He could not remember what had happened before that. In the process of putting him on BiPAP he desatted down to 82% now even on the 4 L.   Prior to Admission medications   Medication Sig Start Date End Date Taking? Authorizing Provider  acetaminophen (TYLENOL) 325 MG tablet Take 2 tablets (650 mg total) by mouth every 6 (six) hours as needed for mild pain (or Fever >/= 101). 08/06/15   Dustin Flock, MD  albuterol (VENTOLIN HFA) 108 (90 Base) MCG/ACT inhaler Inhale into the lungs every 6 (six) hours as needed for wheezing or shortness of breath.    [provider]  atorvastatin (LIPITOR) 40 MG tablet Take 40 mg by mouth every evening.    [provider]  BREO ELLIPTA 100-25 MCG/INH AEPB Inhale 1 puff into the lungs daily. 09/30/20   Jennye Boroughs, MD  diltiazem (CARDIZEM) 120 MG tablet Take 1 tablet (120 mg total) by mouth 2 (two) times daily. 09/30/20   Jennye Boroughs, MD  ipratropium-albuterol (DUONEB) 0.5-2.5 (3) MG/3ML SOLN Take 3 mLs by nebulization every 6 (six) hours as needed. 09/30/20   Jennye Boroughs, MD  montelukast (SINGULAIR) 10 MG tablet Take 1 tablet (10 mg total) by mouth at bedtime. 09/30/20   Jennye Boroughs, MD  nicotine (NICODERM CQ - DOSED IN MG/24 HOURS) 21 mg/24hr patch Place 1 patch (21 mg total) onto the skin daily. 10/23/20   Geradine Girt, DO  nicotine polacrilex (NICORETTE)  2 MG gum Take 1 each (2 mg total) by mouth as needed for smoking cessation. 10/23/20   Geradine Girt, DO  pantoprazole (PROTONIX) 20 MG tablet Take 1 tablet (20 mg total) by mouth daily. 09/30/20   Jennye Boroughs, MD  predniSONE (DELTASONE) 10 MG tablet 40 mg x 2 days then 30 mg x 2 days then 20 mg x 2 days then 10 mg x 2 days then stop 10/23/20   Eulogio Bear U, DO  tiotropium (SPIRIVA) 18 MCG inhalation capsule Place 18 mcg into inhaler and inhale daily.    [provider]    Physical Exam   Triage Vital Signs: ED Triage Vitals  Enc Vitals Group     BP 05/28/21 1631 139/60     Pulse Rate 05/28/21 1631 77     Resp 05/28/21 1631 (!) 22     Temp --      Temp src --      SpO2 05/28/21 1624 (!) 88 %     Weight --      Height 05/28/21 1632 6' (1.829 m)     Head Circumference --      Peak Flow --      Pain Score 05/28/21 1632 7     Pain Loc --      Pain  Edu? --      Excl. in [place]? --     Most recent vital signs: Vitals:   05/28/21 2030 05/28/21 2151  BP: (!) 147/74 127/76  Pulse: 83 89  Resp: (!) 26 18  Temp:  97.9 F (36.6 C)  SpO2: 98% 96%     General: Awake, in respiratory distress.  He is alert and oriented. CV:  Good peripheral perfusion.  Heart regular rate and rhythm no audible murmurs Resp:  Increased respiratory effort with retractions.  Diffuse wheezes. Abd:  No distention.  Nontender Other:  1+ edema bilateral   ED Results / Procedures / Treatments   Labs (all labs ordered are listed, but only abnormal results are displayed) Labs Reviewed  BRAIN NATRIURETIC PEPTIDE - Abnormal; Notable for the following components:      Result Value   B Natriuretic Peptide 101.8 (*)    All other components within normal limits  COMPREHENSIVE METABOLIC PANEL - Abnormal; Notable for the following components:   Sodium 124 (*)    Chloride 83 (*)    Creatinine, Ser 0.40 (*)    Total Protein 8.2 (*)    Alkaline Phosphatase 219 (*)    All other components within  normal limits  CBC WITH DIFFERENTIAL/PLATELET - Abnormal; Notable for the following components:   WBC 19.3 (*)    RBC 4.17 (*)    Hemoglobin 11.5 (*)    HCT 35.9 (*)    RDW 17.2 (*)    Neutro Abs 17.6 (*)    Abs Immature Granulocytes 0.11 (*)    All other components within normal limits  BLOOD GAS, ARTERIAL - Abnormal; Notable for the following components:   pH, Arterial 7.32 (*)    pCO2 arterial 67 (*)    pO2, Arterial 69 (*)    Bicarbonate 34.5 (*)    Acid-Base Excess 6.7 (*)    All other components within normal limits  CBC - Abnormal; Notable for the following components:   WBC 17.6 (*)    RBC 4.04 (*)    Hemoglobin 11.3 (*)    HCT 34.4 (*)    RDW 17.0 (*)    All other components within normal limits  CREATININE, SERUM - Abnormal; Notable for the following components:   Creatinine, Ser 0.50 (*)    All other components within normal limits  BLOOD GAS, ARTERIAL - Abnormal; Notable for the following components:   pH, Arterial 7.34 (*)    pCO2 arterial 65 (*)    pO2, Arterial 63 (*)    Bicarbonate 35.1 (*)    Acid-Base Excess 7.4 (*)    All other components within normal limits  RESP PANEL BY RT-PCR (FLU A&B, COVID) ARPGX2  EXPECTORATED SPUTUM ASSESSMENT W GRAM STAIN, RFLX TO RESP C  PROCALCITONIN  BASIC METABOLIC PANEL  CBC  SODIUM, URINE, RANDOM  OSMOLALITY, URINE  TROPONIN I (HIGH SENSITIVITY)  TROPONIN I (HIGH SENSITIVITY)     EKG EMS EKG read interpreted by me shows normal sinus rhythm rate of 75 normal axis no acute ST-T wave changes.  There is a good bit of artifact especially in V6. EKG in the ER read interpreted by me shows normal sinus rhythm at 89 computer is reading some PVCs.  This looks more like it is artifact.  He has normal axis.  No acute changes.  There is more artifact in V3 this time and also in lead III but again no acute ST-T changes that I can see.  There is a hint  of a Bise phasic T wave in V5 but this was not present on the EMS  EKG.    RADIOLOGY  Chest x-ray read by radiology reviewed by me shows what appears to be increasing interstitial markings possibly CHF or pneumonia.  The patient's elevated white count is certainly suspicious for pneumonia as is his history of coughing up thick colored phlegm.  PROCEDURES:  Critical Care performed: Critical care time half an hour.  This includes managing the patient discussing the patient with the hospitalist reviewing his old records watching him on BiPAP and then checking his second ABG which is just slightly better than the first.  Hospitalist had complained of the patient seeming confused but when I saw him after the hospitalist he did not appear to be confused at that time.  He was somewhat hard to understand especially with the BiPAP on but he knew that he was in [facility] with shortness of breath and had been having increasing shortness of breath and basically gave me the same history again.  Procedures   MEDICATIONS ORDERED IN ED: Medications  atorvastatin (LIPITOR) tablet 40 mg (40 mg Oral Given 05/28/21 2340)  nicotine (NICODERM CQ - dosed in mg/24 hours) patch 21 mg (21 mg Transdermal Patch Applied 05/28/21 2345)  tiotropium (SPIRIVA) inhalation capsule ([facility] use ONLY) 18 mcg (has no administration in time range)  montelukast (SINGULAIR) tablet 10 mg (10 mg Oral Given 05/28/21 2340)  fluticasone furoate-vilanterol (BREO ELLIPTA) 100-25 MCG/ACT 1 puff (has no administration in time range)  pantoprazole (PROTONIX) EC tablet 20 mg (has no administration in time range)  diltiazem (CARDIZEM) tablet 120 mg (has no administration in time range)  methylPREDNISolone sodium succinate (SOLU-MEDROL) 125 mg/2 mL injection 60 mg (60 mg Intravenous Given 05/28/21 2340)    Followed by  predniSONE (DELTASONE) tablet 40 mg (has no administration in time range)  ipratropium-albuterol (DUONEB) 0.5-2.5 (3) MG/3ML nebulizer solution 3 mL (has no administration in time range)   albuterol (PROVENTIL) (2.5 MG/3ML) 0.083% nebulizer solution 2.5 mg (has no administration in time range)  sodium chloride flush (NS) 0.9 % injection 3 mL (3 mLs Intravenous Given 05/28/21 2344)  lactated ringers infusion ( Intravenous New Bag/Given 05/28/21 2312)  acetaminophen (TYLENOL) tablet 650 mg (has no administration in time range)    Or  acetaminophen (TYLENOL) suppository 650 mg (has no administration in time range)  guaiFENesin (MUCINEX) 12 hr tablet 600 mg (600 mg Oral Given 05/28/21 2340)  hydrALAZINE (APRESOLINE) injection 5 mg (has no administration in time range)  heparin injection 5,000 Units (5,000 Units Subcutaneous Given 05/28/21 2256)  thiamine tablet 100 mg (100 mg Oral Given 05/28/21 2256)  ipratropium-albuterol (DUONEB) 0.5-2.5 (3) MG/3ML nebulizer solution 3 mL (has no administration in time range)  ALPRAZolam (XANAX) tablet 0.25 mg (has no administration in time range)  ipratropium-albuterol (DUONEB) 0.5-2.5 (3) MG/3ML nebulizer solution 3 mL (3 mLs Nebulization Given 05/28/21 1646)  ipratropium-albuterol (DUONEB) 0.5-2.5 (3) MG/3ML nebulizer solution 3 mL (3 mLs Nebulization Given 05/28/21 1646)  levofloxacin (LEVAQUIN) IVPB 750 mg (0 mg Intravenous Stopped 05/28/21 1858)  iohexol (OMNIPAQUE) 350 MG/ML injection 80 mL (80 mLs Intravenous Contrast Given 05/28/21 2127)     IMPRESSION / MDM / ASSESSMENT AND PLAN / ED COURSE  I reviewed the triage vital signs and the nursing notes.                     With history of COPD now with increasing shortness of breath and hypoxia to  the point of being altered.  Patient placed on BiPAP and is now doing better.  He was retaining some CO2 but that has improved slightly on the BiPAP.  When I saw him just before admission he was alert and oriented.  Chest x-ray suspicious for pneumonia and CHF.  He is hyponatremic now.  GFR is okay though BNP was only 101 troponin was stable white count was up to 19,000 which is very worrisome with 91%  polys SARS was negative as was the flu test.  Further into his course procalcitonin was done and this is also negative but his white count was still elevated at 17,000.  I had given the patient antibiotics before his procalcitonin came back.  The patient is on the cardiac monitor to evaluate for evidence of arrhythmia and/or significant heart rate changes.        FINAL CLINICAL IMPRESSION(S) / ED DIAGNOSES   Final diagnoses:  COPD exacerbation ([place])  Shortness of breath  Community acquired pneumonia, unspecified laterality  And other history is also possible congestive heart failure   Rx / DC Orders   ED Discharge Orders     None        Note:  This document was prepared using Dragon voice recognition software and may include unintentional dictation errors.   Nena Polio, MD 05/29/21 0010

## 2021-05-28 NOTE — H&P (Signed)
History and Physical    Stanislaw Acton CWU:[id number] DOB: [birth year] DOA: 05/28/2021  PCP: [place]    Patient coming from:  Home    Chief Complaint:  SOB.   HPI:  Jeremiah [name] is a 71 y.o. male seen in ed with complaints of shortness of breath.  Patient has history of COPD pneumonia and tobacco abuse. Per EMS O2 sats were 82% on room air patient was started on 4 L nasal cannula which increased his O2 sats to 90%.  Patient then was started on BiPAP. Pt in ed is on bipap and is not abel to give history.   Pt has past medical history of allergies to penicillin, tobacco abuse, COPD,  ED Course:   Vitals:   05/28/21 1930 05/28/21 2000 05/28/21 2025 05/28/21 2030  BP: 133/69 (!) 143/71  (!) 147/74  Pulse: 73 80  83  Resp: (!) 22 (!) 26  (!) 26  Temp:   98 F (36.7 C)   TempSrc:   Axillary   SpO2: 99% 100%  98%  Height:       In the emergency room patient is afebrile normotensive O2 sats of 96% on BiPAP. Initial EKG shows sinus rhythm 75 with no ST-T wave changes.  Initial chest x-ray shows emphysematous changes with some infiltrates that are concerning for inflammatory or congestion origin.  In the emergency room patient received DuoNebs, Levaquin. After chart review I ordered a CT angio PE protocol along with a baseline ABG for patient.  Review of Systems:  Review of Systems  Unable to perform ROS: Acuity of condition   Past Medical History:  Diagnosis Date   Asthma    COPD (chronic obstructive pulmonary disease) (HCC)    O2 dependent - 2L   GERD (gastroesophageal reflux disease)    HTN (hypertension)    Oxygen dependent    Tobacco dependence     Past Surgical History:  Procedure Laterality Date   CATARACT EXTRACTION W/PHACO Left 12/09/2019   Procedure: CATARACT EXTRACTION PHACO AND INTRAOCULAR LENS PLACEMENT (IOC) COMPLICATED LEFT 38.88 28:00.3;  Surgeon: Birder Robson, MD;  Location: [place];  Service:  Ophthalmology;  Laterality: Left;  MILOOP VISION BLUE   CATARACT EXTRACTION W/PHACO Right 01/27/2020   Procedure: CATARACT EXTRACTION PHACO AND INTRAOCULAR LENS PLACEMENT ([place]) RIGHT VISION BLUE 26.83 02:07.6;  Surgeon: Birder Robson, MD;  Location: [place];  Service: Ophthalmology;  Laterality: Right;   HERNIA REPAIR     NECK SURGERY       reports that he has been smoking cigarettes. He has a 25.00 pack-year smoking history. He has never used smokeless tobacco. He reports current alcohol use. He reports that he does not use drugs.  Allergies  Allergen Reactions   Penicillins Anaphylaxis, Swelling and Other (See Comments)    Tolerated Ceftriaxone 06/2020 05/2020 childhood reaction and his mother told him that he "swelled up."  Had a PCN reaction causing immediate rash, facial/tongue/throat swelling, SOB or lightheadedness with hypotension: Yes Had a PCN reaction causing severe rash involving mucus membranes or skin necrosis: No Had a PCN reaction that required hospitalization No Had a PCN reaction occurring within the last 10 years: No If all the above answers are "NO", may proceed with Cephalosporin use.    Family History  Problem Relation Age of Onset   Cancer - Colon Father    Congestive Heart Failure Mother     Prior to Admission medications   Medication Sig Start Date End Date  Taking? Authorizing Provider  acetaminophen (TYLENOL) 325 MG tablet Take 2 tablets (650 mg total) by mouth every 6 (six) hours as needed for mild pain (or Fever >/= 101). 08/06/15   Dustin Flock, MD  albuterol (VENTOLIN HFA) 108 (90 Base) MCG/ACT inhaler Inhale into the lungs every 6 (six) hours as needed for wheezing or shortness of breath.    [provider]  atorvastatin (LIPITOR) 40 MG tablet Take 40 mg by mouth every evening.    [provider]  BREO ELLIPTA 100-25 MCG/INH AEPB Inhale 1 puff into the lungs daily. 09/30/20   Jennye Boroughs, MD  diltiazem (CARDIZEM) 120  MG tablet Take 1 tablet (120 mg total) by mouth 2 (two) times daily. 09/30/20   Jennye Boroughs, MD  ipratropium-albuterol (DUONEB) 0.5-2.5 (3) MG/3ML SOLN Take 3 mLs by nebulization every 6 (six) hours as needed. 09/30/20   Jennye Boroughs, MD  montelukast (SINGULAIR) 10 MG tablet Take 1 tablet (10 mg total) by mouth at bedtime. 09/30/20   Jennye Boroughs, MD  nicotine (NICODERM CQ - DOSED IN MG/24 HOURS) 21 mg/24hr patch Place 1 patch (21 mg total) onto the skin daily. 10/23/20   Geradine Girt, DO  nicotine polacrilex (NICORETTE) 2 MG gum Take 1 each (2 mg total) by mouth as needed for smoking cessation. 10/23/20   Geradine Girt, DO  pantoprazole (PROTONIX) 20 MG tablet Take 1 tablet (20 mg total) by mouth daily. 09/30/20   Jennye Boroughs, MD  predniSONE (DELTASONE) 10 MG tablet 40 mg x 2 days then 30 mg x 2 days then 20 mg x 2 days then 10 mg x 2 days then stop 10/23/20   Eulogio Bear U, DO  tiotropium (SPIRIVA) 18 MCG inhalation capsule Place 18 mcg into inhaler and inhale daily.    [provider]    Physical Exam: Vitals:   05/28/21 1930 05/28/21 2000 05/28/21 2025 05/28/21 2030  BP: 133/69 (!) 143/71  (!) 147/74  Pulse: 73 80  83  Resp: (!) 22 (!) 26  (!) 26  Temp:   98 F (36.7 C)   TempSrc:   Axillary   SpO2: 99% 100%  98%  Height:       Physical Exam Vitals reviewed.  Constitutional:      Appearance: He is ill-appearing.  HENT:     Head: Normocephalic and atraumatic.     Right Ear: External ear normal.     Left Ear: External ear normal.     Nose: Nose normal.     Mouth/Throat:     Mouth: Mucous membranes are moist.  Eyes:     Extraocular Movements: Extraocular movements intact.     Pupils: Pupils are equal, round, and reactive to light.  Cardiovascular:     Rate and Rhythm: Normal rate and regular rhythm.     Pulses: Normal pulses.     Heart sounds: Normal heart sounds. No murmur heard. Pulmonary:     Effort: Pulmonary effort is normal.     Breath sounds:  Wheezing and rhonchi present.  Abdominal:     General: Bowel sounds are normal. There is no distension.     Palpations: Abdomen is soft. There is no mass.     Tenderness: There is no abdominal tenderness. There is no guarding.     Hernia: No hernia is present.  Musculoskeletal:     Cervical back: Normal range of motion and neck supple.     Right lower leg: No edema.  Left lower leg: No edema.  Skin:    General: Skin is warm.  Neurological:     General: No focal deficit present.     Mental Status: He is alert and oriented to person, place, and time.  Psychiatric:        Mood and Affect: Mood normal.        Behavior: Behavior normal.     Labs on Admission: I have personally reviewed following labs and imaging studies No results for input(s): CKTOTAL, CKMB, TROPONINI in the last 72 hours. Lab Results  Component Value Date   WBC 19.3 (H) 05/28/2021   HGB 11.5 (L) 05/28/2021   HCT 35.9 (L) 05/28/2021   MCV 86.1 05/28/2021   PLT 369 05/28/2021    Recent Labs  Lab 05/28/21 1642  NA 124*  K 4.8  CL 83*  CO2 31  BUN 14  CREATININE 0.40*  CALCIUM 8.9  PROT 8.2*  BILITOT 0.7  ALKPHOS 219*  ALT 18  AST 27  GLUCOSE 81   No results found for: CHOL, HDL, LDLCALC, TRIG Lab Results  Component Value Date   DDIMER 1.40 (H) 05/18/2020   Invalid input(s): POCBNP   COVID-19 Labs No results for input(s): DDIMER, FERRITIN, LDH, CRP in the last 72 hours. Lab Results  Component Value Date   SARSCOV2NAA NEGATIVE 05/28/2021   [place] NEGATIVE 10/18/2020   [place] NEGATIVE 09/26/2020   [place] NEGATIVE 06/09/2020    Radiological Exams on Admission: DG Chest Portable 1 View  Result Date: 05/28/2021 CLINICAL DATA:  Shortness of breath EXAM: PORTABLE CHEST 1 VIEW COMPARISON:  05/20/2020, CT 10/18/2020, chest x-ray 05/14/2020 FINDINGS: Emphysema and diffuse bronchitic changes. Overall diffuse increased interstitial opacity compared to previous exam suggesting acute  superimposed interstitial edema or inflammatory process. No confluent airspace disease, pleural effusion or pneumothorax. Stable cardiomediastinal silhouette with aortic atherosclerosis. IMPRESSION: Emphysema and bronchitic changes. Overall diffusely increased interstitial opacity is suspicious for acute superimposed interstitial edema or inflammatory process. No confluent airspace disease is seen. Electronically Signed   By: Donavan Foil M.D.   On: 05/28/2021 17:11    EKG: Independently no acute.  Sinus rhythm, 75 with no ST-T wave changes.   Assessment/Plan: Principal Problem:   Acute and chronic respiratory failure (acute-on-chronic) (HCC) Active Problems:   COPD, moderate (HCC)   Tobacco dependency   Hyponatremia   Anemia   HTN (hypertension)   GERD (gastroesophageal reflux disease)   Acute respiratory failure with hypoxia (HCC)   Acute on chronic respiratory failure/ pneumonia /COPD/tobacco abuse: Patient presenting with acute respiratory failure in respiratory distress with hypoxia requiring BiPAP for oxygenation. Will obtain an ABG as baseline to assess PCO2 and O2 status. We will continue with duo nebs steroids and MDIs. Supplemental oxygen as needed. Tobacco cessation counseling and nicotine patch. PFTs when patient is stable. Pulmonary consult on outpatient basis. Incentive spirometry while in-house. Ambulatory pulse ox upon discharge. Will also obtain a CT angio of the chest. High resolution chest CT on outpatient basis.  Hyponatremia: Patient has been hyponatremic since 2014. No diuretic therapy. Patient has no history of hypoglycemia. No recent prostate surgery. No recent mannitol or IVIG. No reason for pseudohyponatremia. No renal impairment. No diuretic specifically thiazide diuretic therapy. No edema or ascites. No hypovolemia. No volume overload or hypervolemia. We will obtain a urine sodium and urine osmolality.  Anemia: Patient since 2017 from chart  review. Current hemoglobin is 11.5 and is stable.   Type and screen, IV PPI.  Stool guaiac.  Hypertension: Blood pressure (!) 147/74, pulse 83, temperature 98 F (36.7 C), temperature source Axillary, resp. rate (!) 26, height 6' (1.829 m), SpO2 98 %. Patient continued on diltiazem.Marland Kitchen   GERD: IV PPI therapy.    DVT prophylaxis:  Heparin  Code Status:  Full code  Family Communication:  Martin,Annette (Friend)  [phone] (Mobile)   Disposition Plan:  Home  Consults called:  None  Admission status: Inpatient   Medical Decision Making   Coding    Para Skeans MD [facility]  6 PM- 2 AM. Please contact me via secure Chat 6 PM-2 AM. To contact the [facility] Attending or Consulting provider [place] or covering provider during after hours [place], for this patient.   Check the care team in [facility] and look for a) attending/consulting [facility] provider listed and b) the [facility] team listed Log into [email or web address] and use [place] universal password to access. If you do not have the password, please contact the hospital operator. Locate the [facility] provider you are looking for under [facility] and page to a number that you can be directly reached. If you still have difficulty reaching the provider, please page the [facility] (Director on Call) for the Hospitalists listed on amion for assistance. [email or web address] 05/28/2021, 8:42 PM

## 2021-05-29 DIAGNOSIS — F17200 Nicotine dependence, unspecified, uncomplicated: Secondary | ICD-10-CM

## 2021-05-29 DIAGNOSIS — E871 Hypo-osmolality and hyponatremia: Secondary | ICD-10-CM

## 2021-05-29 DIAGNOSIS — J9622 Acute and chronic respiratory failure with hypercapnia: Secondary | ICD-10-CM

## 2021-05-29 DIAGNOSIS — J9621 Acute and chronic respiratory failure with hypoxia: Secondary | ICD-10-CM

## 2021-05-29 DIAGNOSIS — E440 Moderate protein-calorie malnutrition: Secondary | ICD-10-CM

## 2021-05-29 DIAGNOSIS — R5381 Other malaise: Secondary | ICD-10-CM

## 2021-05-29 DIAGNOSIS — J441 Chronic obstructive pulmonary disease with (acute) exacerbation: Secondary | ICD-10-CM

## 2021-05-29 LAB — RESPIRATORY PANEL BY PCR

## 2021-05-29 LAB — CBC
HCT: 32.4 % — ABNORMAL LOW (ref 39.0–52.0)
Hemoglobin: 10.5 g/dL — ABNORMAL LOW (ref 13.0–17.0)
MCH: 27.4 pg (ref 26.0–34.0)
MCHC: 32.4 g/dL (ref 30.0–36.0)
MCV: 84.6 fL (ref 80.0–100.0)
Platelets: 340 10*3/uL (ref 150–400)
RBC: 3.83 MIL/uL — ABNORMAL LOW (ref 4.22–5.81)
RDW: 16.6 % — ABNORMAL HIGH (ref 11.5–15.5)
WBC: 13.8 10*3/uL — ABNORMAL HIGH (ref 4.0–10.5)
nRBC: 0 % (ref 0.0–0.2)

## 2021-05-29 LAB — SODIUM
Sodium: 122 mmol/L — ABNORMAL LOW (ref 135–145)
Sodium: 120 mmol/L — ABNORMAL LOW (ref 135–145)
Sodium: 122 mmol/L — ABNORMAL LOW (ref 135–145)

## 2021-05-29 LAB — BASIC METABOLIC PANEL
Anion gap: 7 (ref 5–15)
BUN: 15 mg/dL (ref 8–23)
CO2: 30 mmol/L (ref 22–32)
Calcium: 8.6 mg/dL — ABNORMAL LOW (ref 8.9–10.3)
Chloride: 83 mmol/L — ABNORMAL LOW (ref 98–111)
Creatinine, Ser: 0.51 mg/dL — ABNORMAL LOW (ref 0.61–1.24)
GFR, Estimated: >60 mL/min (ref >60)
Glucose, Bld: 197 mg/dL — ABNORMAL HIGH (ref 70–99)
Potassium: 4.5 mmol/L (ref 3.5–5.1)
Sodium: 120 mmol/L — ABNORMAL LOW (ref 135–145)

## 2021-05-29 LAB — PREALBUMIN: Prealbumin: 6.4 mg/dL — ABNORMAL LOW (ref 18–38)

## 2021-05-29 LAB — PROCALCITONIN: Procalcitonin: <0.1 ng/mL

## 2021-05-29 MED ORDER — SUMATRIPTAN SUCCINATE 50 MG PO TABS
25.0000 mg | ORAL_TABLET | Freq: Once | ORAL | Status: AC
  Administered 2021-05-29: 25 mg via ORAL
  Filled 2021-05-29: qty 1

## 2021-05-29 MED ORDER — SODIUM CHLORIDE 0.9 % IV SOLN
500.0000 mg | INTRAVENOUS | Status: DC
  Administered 2021-05-29: 500 mg via INTRAVENOUS
  Filled 2021-05-29: qty 5

## 2021-05-29 MED ORDER — METHYLPREDNISOLONE SODIUM SUCC 125 MG IJ SOLR
60.0000 mg | Freq: Two times a day (BID) | INTRAMUSCULAR | Status: DC
  Administered 2021-05-29 – 2021-05-31 (×4): 60 mg via INTRAVENOUS
  Filled 2021-05-29 (×4): qty 2

## 2021-05-29 NOTE — Evaluation (Signed)
Physical Therapy Evaluation Patient Details Name: Jeremiah [name] MRN: [id number] DOB: [birth year] Today's Date: 05/29/2021  History of Present Illness  Jeremiah [name] is a 71 y.o. male who has a history of COPD and pneumonia.  He is smoking but has cut back.  He complains of several days of increasing shortness of breath with thick yellowish phlegm which is producing.  There is possibly a little bit more phlegm than usual.  EMS reports that he was running 82% on room air and went up to 88 and 90% on 4 L.  He became much more awake on 4 L.  He could not remember what had happened before that.  Clinical Impression  The pt presents in good spirits. He has some hesitancy about mobility d/t what he describes as anxiety about moving too fast and having an inability to catch breath. Pt assured that mobility would be progressive and that today's session would just evaluate low mobility tasks, progressing over time. Pt was agreeable. He requires verbal cues for pacing of activity and rest breaks between each mobility task. For instance, the pt required seated break break x 2 min following achieving EOB, standing rest break x 45s once achieving standing, and presented with RPE of 7-8/10 following transfer to bedside chair. At this time he requires little assistance with mobility and oxygen saturation remained at 92% on 4L with all mobility. It is expected that with continued therapy the pt will be able to progress to safely d/c home with family care and HHPT services. Will continue to monitor progression.        Recommendations for follow up therapy are one component of a multi-disciplinary discharge planning process, led by the attending physician.  Recommendations may be updated based on patient status, additional functional criteria and insurance authorization.  Follow Up Recommendations Home health PT    Assistance Recommended at Discharge Intermittent Supervision/Assistance  Patient can return  home with the following  Assistance with cooking/housework;Help with stairs or ramp for entrance;Assist for transportation;A lot of help with bathing/dressing/bathroom;A lot of help with walking and/or transfers    Equipment Recommendations Rolling walker (2 wheels)  Recommendations for Other Services       Functional Status Assessment Patient has had a recent decline in their functional status and demonstrates the ability to make significant improvements in function in a reasonable and predictable amount of time.     Precautions / Restrictions Precautions Precautions: Fall Restrictions Weight Bearing Restrictions: No      Mobility  Bed Mobility Overal bed mobility: Needs Assistance Bed Mobility: Supine to Sit     Supine to sit: HOB elevated, Modified independent (Device/Increase time)     General bed mobility comments: HOB elevated, requiring increased time. Increased work of breathing with task. Educated on pacing.    Transfers Overall transfer level: Needs assistance Equipment used: None Transfers: Sit to/from Stand, Bed to chair/wheelchair/BSC Sit to Stand: Min guard, Min assist Stand pivot transfers: Min guard         General transfer comment: Pt requiring Min A for eccentric control to lower to bedside chair.    Ambulation/Gait               General Gait Details: Pt with increased work of breathing and SOB with bed<>chair transfer. Did not attempt short distance gait at this time. 02 remained at 92% with all mobility.  Stairs            Wheelchair Mobility    Modified Rankin (  Stroke Patients Only)       Balance Overall balance assessment: Needs assistance Sitting-balance support: Feet supported, Bilateral upper extremity supported Sitting balance-Leahy Scale: Good Sitting balance - Comments: Pt sitting with forward lean forearms propped on elbows d/t SOB.   Standing balance support: No upper extremity supported Standing balance-Leahy  Scale: Fair Standing balance comment: Pt presenting with anterior-posterior sway during static standing.                             Pertinent Vitals/Pain Pain Assessment Pain Assessment: No/denies pain    Home Living Family/patient expects to be discharged to:: Private residence Living Arrangements: Non-relatives/Friends   Type of Home: Mobile home Home Access: Stairs to enter Entrance Stairs-Rails: Right;Left Entrance Stairs-Number of Steps: 10 at front, bilat rails; 2 at back door, but unable to access back door. ( taken from prior visit)   Home Layout: One level   Additional Comments: Pt hx obtained from previous admission    Prior Function Prior Level of Function : Independent/Modified Independent                     Hand Dominance   Dominant Hand: Right    Extremity/Trunk Assessment   Upper Extremity Assessment Upper Extremity Assessment: Defer to OT evaluation    Lower Extremity Assessment Lower Extremity Assessment: Generalized weakness    Cervical / Trunk Assessment Cervical / Trunk Assessment: Kyphotic  Communication   Communication: No difficulties  Cognition Arousal/Alertness: Awake/alert Behavior During Therapy: WFL for tasks assessed/performed Overall Cognitive Status: Within Functional Limits for tasks assessed                                          General Comments      Exercises     Assessment/Plan    PT Assessment Patient needs continued PT services  PT Problem List Decreased strength;Decreased mobility;Decreased activity tolerance;Cardiopulmonary status limiting activity;Decreased balance       PT Treatment Interventions Gait training;Stair training;Balance training;Functional mobility training;Therapeutic exercise;Therapeutic activities    PT Goals (Current goals can be found in the Care Plan section)  Acute Rehab PT Goals Patient Stated Goal: Move more everyday PT Goal Formulation: With  patient Time For Goal Achievement: 06/12/21 Potential to Achieve Goals: Good    Frequency Min 2X/week     Co-evaluation               AM-PAC PT "6 Clicks" Mobility  Outcome Measure Help needed turning from your back to your side while in a flat bed without using bedrails?: A Little Help needed moving from lying on your back to sitting on the side of a flat bed without using bedrails?: A Little Help needed moving to and from a bed to a chair (including a wheelchair)?: A Little Help needed standing up from a chair using your arms (e.g., wheelchair or bedside chair)?: A Little Help needed to walk in hospital room?: A Little Help needed climbing 3-5 steps with a railing? : A Lot 6 Click Score: 17    End of Session Equipment Utilized During Treatment: Oxygen Activity Tolerance: Patient tolerated treatment well;Patient limited by fatigue Patient left: in chair;with call bell/phone within reach Nurse Communication: Mobility status PT Visit Diagnosis: Unsteadiness on feet (R26.81);Muscle weakness (generalized) (M62.81)    Time: 1610-9604 PT Time Calculation (min) (ACUTE ONLY):  21 min   Charges:   PT Evaluation $PT Eval Moderate Complexity: 1 Mod PT Treatments $Therapeutic Activity: 8-22 mins        9:33 AM, 05/29/21 Molleigh Huot A. Saverio Danker PT, DPT Physical Therapist - [facility] [place] 05/29/2021, 9:26 AM

## 2021-05-29 NOTE — Progress Notes (Signed)
PROGRESS NOTE  Jeremiah [name] UDJ:[id number] DOB: 1951-04-22   PCP: [place]  Patient is from: Lives with his friend.  DOA: 05/28/2021 LOS: 1  Chief complaints:  Chief Complaint  Patient presents with   Shortness of Breath     Brief Narrative / Interim history: 71 year old M with PMH of COPD/chronic RF on 4 L, pneumonia, tobacco use disorder, HTN, anemia and chronic venous insufficiency presenting with shortness of breath, productive cough, DOE, chest pain with cough, and admitted with COPD exacerbation and hyponatremia to 120.  Reportedly saturating at 82% on RA when EMS arrived but recovered to 90% on 4 L.  In ED, he desaturated to 84% on 4 L by [place].  VBG with compensated respiratory acidosis.  BNP 101.  Serial troponin negative.  WBC 14.  CTA chest negative for PE but severe emphysema, PAH, multifocal symmetric peripheral pulmonary infiltrates concerning for atypical infection/inflammation, progressive, pathologic right hilar adenopathy and stable 12 mm RLL pulmonary nodule patient was started on BiPAP, IV Solu-Medrol and nebulizers  Subjective: Seen and examined earlier this morning.  Continues to endorse shortness of breath, productive cough and DOE.  Felt winded walking with therapy this morning.  He denies chest pain, GI or UTI symptoms.  Objective: Vitals:   05/29/21 0439 05/29/21 0716 05/29/21 0804 05/29/21 0912  BP: 119/61  (!) 129/59   Pulse: 70  71   Resp: 16  19   Temp: (!) 97.5 F (36.4 C)  97.7 F (36.5 C)   TempSrc:   Oral   SpO2: 96% 96% 96% 92%  Height:        Examination:  GENERAL: Frail looking elderly male.  No apparent distress. HEENT: MMM.  Vision and hearing grossly intact.  NECK: Supple.  No apparent JVD.  RESP: 92% on 4 L.  Some work of breathing.  Diminished aeration bilaterally.  End expiratory wheeze. CVS:  RRR. Heart sounds normal.  ABD/GI/GU: BS+. Abd soft, NTND.  MSK/EXT:  Moves extremities.  BLE edema with chronic  venous insufficiency.  Significant muscle mass and subcu fat loss. SKIN: no apparent skin lesion or wound NEURO: Awake, alert and oriented appropriately.  No apparent focal neuro deficit. PSYCH: Calm. Normal affect.   Procedures:  None  Microbiology summarized: HYIFO-27 and influenza PCR nonreactive. Full RVP pending.  Assessment & Plan: Acute on chronic respiratory failure with hypoxia and hypercapnia due to COPD exacerbation-VBG with compensated respiratory acidosis with hypercapnia.  Desaturated to 84% on 4 L by [place] requiring BiPAP in ED.  Still with shortness of breath, productive cough, dyspnea on exertion, work of breathing and diminished aeration bilaterally.  CT chest raises concern for severe emphysema, atypical infection and pulmonary hypertension although he had normal RVSP on his TTE in 06/2020.  Procalcitonin negative.  Currently saturating at 92% on home 4 L at rest. -Wean oxygen-minimum oxygen to keep saturation above 88% -Continue IV Solu-Medrol, scheduled and as needed nebulizers -Add azithromycin -Encourage tobacco cessation. -Follow full RVP -IS/OOB/PT/OT  Hyponatremia: Somewhat chronic.  Baseline seems to be upper 120s.  Slightly elevated urine sodium in the past suggesting some element of SIADH. Recent Labs  Lab 05/28/21 1642 05/29/21 0448 05/29/21 0725  NA 124* 120* 122*  -Recheck urine chemistry -Monitor sodium every 8 hours -If urine chemistry suggest SIADH, would consider fluid restriction and p.o. sodium chloride  Essential hypertension: Normotensive -Continue home Cardizem.  Tobacco use disorder: Reports smoking about half a pack a day -Encouraged cessation. -Nicotine patch  Pulmonary  nodule: CTA showed stable 12 mm RLL nodule -Repeat CT in 12 months.  Physical deconditioning-patient lives with friends.  Gets around holding to stuff in the house.  Has no walker. -PT/OT  Moderate malnutrition-patient with significant muscle mass and subcu fat loss,  lower extremity edema Body mass index is 23.15 kg/m. Wt Readings from Last 10 Encounters:  10/19/20 77.4 kg  09/26/20 75.3 kg  06/17/20 85 kg  05/17/20 93.6 kg  01/27/20 83 kg  12/09/19 82.6 kg  10/20/16 87.2 kg  08/19/15 111.9 kg  07/28/15 108 kg  -Consult dietitian          DVT prophylaxis:  heparin injection 5,000 Units Start: 05/28/21 2200  Code Status: Full code Family Communication: Patient and/or RN. Available if any question.  Level of care: Progressive Status is: Inpatient  Remains inpatient appropriate because: Acute on chronic respiratory failure with hypoxia and hypercapnia due to COPD exacerbation       Consultants:  None   Sch Meds:  Scheduled Meds:  atorvastatin  40 mg Oral QPM   diltiazem  120 mg Oral BID   heparin  5,000 Units Subcutaneous Q8H   ipratropium-albuterol  3 mL Nebulization Q6H   methylPREDNISolone (SOLU-MEDROL) injection  60 mg Intravenous Q12H   montelukast  10 mg Oral QHS   nicotine  21 mg Transdermal Daily   pantoprazole  20 mg Oral Daily   sodium chloride flush  3 mL Intravenous Q12H   thiamine  100 mg Oral Daily   Continuous Infusions:  azithromycin     PRN Meds:.acetaminophen **OR** acetaminophen, albuterol, guaiFENesin, hydrALAZINE, ipratropium-albuterol  Antimicrobials: Anti-infectives (From admission, onward)    Start     Dose/Rate Route Frequency Ordered Stop   05/29/21 1700  azithromycin (ZITHROMAX) 500 mg in sodium chloride 0.9 % 250 mL IVPB        500 mg 250 mL/hr over 60 Minutes Intravenous Every 24 hours 05/29/21 0657 06/01/21 1659   05/28/21 1730  levofloxacin (LEVAQUIN) IVPB 750 mg        750 mg 100 mL/hr over 90 Minutes Intravenous  Once 05/28/21 1723 05/28/21 1858        I have personally reviewed the following labs and images: CBC: Recent Labs  Lab 05/28/21 1642 05/28/21 2023 05/29/21 0448  WBC 19.3* 17.6* 13.8*  NEUTROABS 17.6*  --   --   HGB 11.5* 11.3* 10.5*  HCT 35.9* 34.4* 32.4*   MCV 86.1 85.1 84.6  PLT 369 367 340   BMP &GFR Recent Labs  Lab 05/28/21 1642 05/28/21 2023 05/29/21 0448 05/29/21 0725  NA 124*  --  120* 122*  K 4.8  --  4.5  --   CL 83*  --  83*  --   CO2 31  --  30  --   GLUCOSE 81  --  197*  --   BUN 14  --  15  --   CREATININE 0.40* 0.50* 0.51*  --   CALCIUM 8.9  --  8.6*  --    CrCl cannot be calculated (Unknown ideal weight.). Liver & Pancreas: Recent Labs  Lab 05/28/21 1642  AST 27  ALT 18  ALKPHOS 219*  BILITOT 0.7  PROT 8.2*  ALBUMIN 3.6   No results for input(s): LIPASE, AMYLASE in the last 168 hours. No results for input(s): AMMONIA in the last 168 hours. Diabetic: No results for input(s): HGBA1C in the last 72 hours. No results for input(s): GLUCAP in the last 168 hours. Cardiac Enzymes:  No results for input(s): CKTOTAL, CKMB, CKMBINDEX, TROPONINI in the last 168 hours. No results for input(s): PROBNP in the last 8760 hours. Coagulation Profile: No results for input(s): INR, PROTIME in the last 168 hours. Thyroid Function Tests: No results for input(s): TSH, T4TOTAL, FREET4, T3FREE, THYROIDAB in the last 72 hours. Lipid Profile: No results for input(s): CHOL, HDL, LDLCALC, TRIG, CHOLHDL, LDLDIRECT in the last 72 hours. Anemia Panel: No results for input(s): VITAMINB12, FOLATE, FERRITIN, TIBC, IRON, RETICCTPCT in the last 72 hours. Urine analysis:    Component Value Date/Time   COLORURINE YELLOW (A) 06/10/2020 0220   APPEARANCEUR CLEAR (A) 06/10/2020 0220   APPEARANCEUR Clear 09/12/2013 0920   LABSPEC 1.012 06/10/2020 0220   LABSPEC 1.010 09/12/2013 0920   PHURINE 5.0 06/10/2020 0220   GLUCOSEU NEGATIVE 06/10/2020 0220   GLUCOSEU Negative 09/12/2013 0920   HGBUR NEGATIVE 06/10/2020 0220   BILIRUBINUR NEGATIVE 06/10/2020 0220   BILIRUBINUR Negative 09/12/2013 0920   KETONESUR NEGATIVE 06/10/2020 0220   PROTEINUR NEGATIVE 06/10/2020 0220   NITRITE NEGATIVE 06/10/2020 0220   LEUKOCYTESUR NEGATIVE 06/10/2020  0220   LEUKOCYTESUR Negative 09/12/2013 0920   Sepsis Labs: Invalid input(s): PROCALCITONIN, [place]  Microbiology: Recent Results (from the past 240 hour(s))  Resp Panel by RT-PCR (Flu A&B, Covid) Nasopharyngeal Swab     Status: None   Collection Time: 05/28/21  5:06 PM   Specimen: Nasopharyngeal Swab; Nasopharyngeal(NP) swabs in vial transport medium  Result Value Ref Range Status   SARS Coronavirus 2 by RT PCR NEGATIVE NEGATIVE Final    Comment: (NOTE) SARS-CoV-2 target nucleic acids are NOT DETECTED.  The SARS-CoV-2 RNA is generally detectable in upper respiratory specimens during the acute phase of infection. The lowest concentration of SARS-CoV-2 viral copies this assay can detect is 138 copies/mL. A negative result does not preclude SARS-Cov-2 infection and should not be used as the sole basis for treatment or other patient management decisions. A negative result may occur with  improper specimen collection/handling, submission of specimen other than nasopharyngeal swab, presence of viral mutation(s) within the areas targeted by this assay, and inadequate number of viral copies(<138 copies/mL). A negative result must be combined with clinical observations, patient history, and epidemiological information. The expected result is Negative.  Fact Sheet for Patients:  EntrepreneurPulse.com.au  Fact Sheet for Healthcare Providers:  IncredibleEmployment.be  This test is no t yet approved or cleared by the [place] FDA and  has been authorized for detection and/or diagnosis of SARS-CoV-2 by FDA under an Emergency Use Authorization (EUA). This EUA will remain  in effect (meaning this test can be used) for the duration of the COVID-19 declaration under Section 564(b)(1) of the Act, 21 U.S.C.section 360bbb-3(b)(1), unless the authorization is terminated  or revoked sooner.       Influenza A by PCR NEGATIVE NEGATIVE Final    Influenza B by PCR NEGATIVE NEGATIVE Final    Comment: (NOTE) The Xpert Xpress SARS-CoV-2/FLU/RSV plus assay is intended as an aid in the diagnosis of influenza from Nasopharyngeal swab specimens and should not be used as a sole basis for treatment. Nasal washings and aspirates are unacceptable for Xpert Xpress SARS-CoV-2/FLU/RSV testing.  Fact Sheet for Patients: EntrepreneurPulse.com.au  Fact Sheet for Healthcare Providers: IncredibleEmployment.be  This test is not yet approved or cleared by the [place] FDA and has been authorized for detection and/or diagnosis of SARS-CoV-2 by FDA under an Emergency Use Authorization (EUA). This EUA will remain in effect (meaning this test can be used) for the  duration of the COVID-19 declaration under Section 564(b)(1) of the Act, 21 U.S.C. section 360bbb-3(b)(1), unless the authorization is terminated or revoked.  Performed at [facility], [address], [place], [place] [address]     Radiology Studies: CT Angio Chest Pulmonary Embolism (PE) W or WO Contrast  Result Date: 05/28/2021 CLINICAL DATA:  Dyspnea, cyanosis, altered mental status EXAM: CT ANGIOGRAPHY CHEST WITH CONTRAST TECHNIQUE: Multidetector CT imaging of the chest was performed using the standard protocol during bolus administration of intravenous contrast. Multiplanar CT image reconstructions and MIPs were obtained to evaluate the vascular anatomy. RADIATION DOSE REDUCTION: This exam was performed according to the departmental dose-optimization program which includes automated exposure control, adjustment of the mA and/or kV according to patient size and/or use of iterative reconstruction technique. CONTRAST:  48mL OMNIPAQUE IOHEXOL 350 MG/ML SOLN COMPARISON:  10/18/2020 FINDINGS: Cardiovascular: There is adequate opacification of the pulmonary arterial tree. No intraluminal filling defect identified to suggest acute pulmonary  embolism. The central pulmonary arteries are enlarged in keeping with changes of pulmonary arterial hypertension. Extensive multi-vessel coronary artery calcification. Global cardiac size within normal limits. Degenerative calcification of the aortic valve leaflets noted. Trace pericardial effusion. Extensive atherosclerotic calcification of the thoracic aorta with particularly prominent atherosclerotic calcification of the origin of the left common carotid, left vertebral, and left subclavian arteries at their origin from the aortic arch. This almost certainly results in hemodynamically significant stenosis of the left vertebral artery origin. Mediastinum/Nodes: There is progressive pathologic right hilar adenopathy with multiple lymph nodes now measuring 16-17 mm in short axis diameter. Shotty subcarinal and left hilar adenopathy persists unchanged. Visualized thyroid is unremarkable. Esophagus is unremarkable. Lungs/Pleura: Severe centrilobular emphysema again noted since the prior examination there have developed scattered asymmetric mid and lower lung zone peripheral pulmonary infiltrates and areas of focal consolidation which may reflect changes of atypical infection, including viral pneumonia, in the acute setting. Stable mean 12 mm nodule within the subpleural right lower lobe, axial image # 88/6. No pneumothorax or pleural effusion. Central airways are widely patent. There are scattered areas of peripheral airway impaction noted, however, best appreciated within the lower lobes bilaterally. Upper Abdomen: No acute abnormality. Musculoskeletal: Osseous structures are age-appropriate. No acute bone abnormality. No lytic or blastic bone lesion. Review of the MIP images confirms the above findings. IMPRESSION: No pulmonary embolism. Extensive coronary artery calcification. Degenerative calcification of the aortic valve leaflets. Echocardiography may be helpful to assess the degree of valvular dysfunction.  Morphologic changes in keeping with pulmonary arterial hypertension. Severe emphysema. Interval development of multifocal asymmetric peripheral pulmonary infiltrates, most in keeping with atypical infection, including viral infection, or inflammation in the acute setting. Stable 12 mm right lower lobe pulmonary nodule, indeterminate. Repeat CT imaging in note 12-18 months would be helpful to assure continued stability. This recommendation follows the consensus statement: Guidelines for Management of Incidental Pulmonary Nodules Detected on CT Images: From the Fleischner Society 2017; Radiology 2017; 284:228-243. Progressive, pathologic right hilar adenopathy. This may be reactive in nature given the acute inflammatory findings, however, follow-up evaluation once the patient's acute issues have resolved may be helpful to document resolution. Peripheral vascular disease. If there is clinical evidence of vertebrobasilar insufficiency, CT arteriography may be helpful for further evaluation. Aortic Atherosclerosis (ICD10-I70.0) and Emphysema (ICD10-J43.9). Electronically Signed   By: Fidela Salisbury M.D.   On: 05/28/2021 22:03   DG Chest Portable 1 View  Result Date: 05/28/2021 CLINICAL DATA:  Shortness of breath EXAM: PORTABLE CHEST 1 VIEW  COMPARISON:  05/20/2020, CT 10/18/2020, chest x-ray 05/14/2020 FINDINGS: Emphysema and diffuse bronchitic changes. Overall diffuse increased interstitial opacity compared to previous exam suggesting acute superimposed interstitial edema or inflammatory process. No confluent airspace disease, pleural effusion or pneumothorax. Stable cardiomediastinal silhouette with aortic atherosclerosis. IMPRESSION: Emphysema and bronchitic changes. Overall diffusely increased interstitial opacity is suspicious for acute superimposed interstitial edema or inflammatory process. No confluent airspace disease is seen. Electronically Signed   By: Donavan Foil M.D.   On: 05/28/2021 17:11        Kalei Meda T. Potter  If 7PM-7AM, please contact night-coverage [email or web address] 05/29/2021, 10:08 AM

## 2021-05-29 NOTE — Progress Notes (Signed)
OT Cancellation Note  Patient Details Name: Jeremiah [name] MRN: [id number] DOB: 01-15-1951   Cancelled Treatment:    Reason Eval/Treat Not Completed: Patient declined, no reason specified. Order received and chart reviewed. Upon arrival to room, pt awake and sitting upright in bed following return to bed after PT session. Pt reporting nausea and fatigue following return to bed and politely requesting OT to re-attempt evaluation tomorrow morning. OT to re-attempt at later time/date as able.  Fredirick Maudlin, OTR/L [place]

## 2021-05-30 DIAGNOSIS — B348 Other viral infections of unspecified site: Secondary | ICD-10-CM

## 2021-05-30 DIAGNOSIS — E875 Hyperkalemia: Secondary | ICD-10-CM

## 2021-05-30 LAB — RENAL FUNCTION PANEL
Albumin: 2.9 g/dL — ABNORMAL LOW (ref 3.5–5.0)
Anion gap: 4 — ABNORMAL LOW (ref 5–15)
BUN: 22 mg/dL (ref 8–23)
CO2: 32 mmol/L (ref 22–32)
Calcium: 8.5 mg/dL — ABNORMAL LOW (ref 8.9–10.3)
Chloride: 86 mmol/L — ABNORMAL LOW (ref 98–111)
Creatinine, Ser: 0.5 mg/dL — ABNORMAL LOW (ref 0.61–1.24)
GFR, Estimated: >60 mL/min (ref >60)
Glucose, Bld: 169 mg/dL — ABNORMAL HIGH (ref 70–99)
Phosphorus: 3 mg/dL (ref 2.5–4.6)
Potassium: 5.7 mmol/L — ABNORMAL HIGH (ref 3.5–5.1)
Sodium: 122 mmol/L — ABNORMAL LOW (ref 135–145)

## 2021-05-30 LAB — CBC
HCT: 33.4 % — ABNORMAL LOW (ref 39.0–52.0)
Hemoglobin: 10.9 g/dL — ABNORMAL LOW (ref 13.0–17.0)
MCH: 27.7 pg (ref 26.0–34.0)
MCHC: 32.6 g/dL (ref 30.0–36.0)
MCV: 84.8 fL (ref 80.0–100.0)
Platelets: 356 10*3/uL (ref 150–400)
RBC: 3.94 MIL/uL — ABNORMAL LOW (ref 4.22–5.81)
RDW: 16.5 % — ABNORMAL HIGH (ref 11.5–15.5)
WBC: 16.5 10*3/uL — ABNORMAL HIGH (ref 4.0–10.5)
nRBC: 0 % (ref 0.0–0.2)

## 2021-05-30 LAB — PROCALCITONIN: Procalcitonin: <0.1 ng/mL

## 2021-05-30 LAB — MAGNESIUM: Magnesium: 2.1 mg/dL (ref 1.7–2.4)

## 2021-05-30 LAB — SODIUM
Sodium: 122 mmol/L — ABNORMAL LOW (ref 135–145)
Sodium: 120 mmol/L — ABNORMAL LOW (ref 135–145)
Sodium: 125 mmol/L — ABNORMAL LOW (ref 135–145)

## 2021-05-30 LAB — TSH: TSH: 0.416 u[IU]/mL (ref 0.350–4.500)

## 2021-05-30 MED ORDER — ENSURE ENLIVE PO LIQD
237.0000 mL | Freq: Three times a day (TID) | ORAL | Status: DC
  Administered 2021-05-30 – 2021-06-03 (×6): 237 mL via ORAL

## 2021-05-30 MED ORDER — AZITHROMYCIN 250 MG PO TABS
500.0000 mg | ORAL_TABLET | Freq: Every day | ORAL | Status: AC
  Administered 2021-05-30 – 2021-05-31 (×2): 500 mg via ORAL
  Filled 2021-05-30 (×2): qty 2

## 2021-05-30 MED ORDER — ADULT MULTIVITAMIN W/MINERALS CH
1.0000 | ORAL_TABLET | Freq: Every day | ORAL | Status: DC
  Administered 2021-05-30 – 2021-06-03 (×5): 1 via ORAL
  Filled 2021-05-30 (×5): qty 1

## 2021-05-30 MED ORDER — SODIUM ZIRCONIUM CYCLOSILICATE 10 G PO PACK
10.0000 g | PACK | Freq: Once | ORAL | Status: AC
  Administered 2021-05-30: 10 g via ORAL
  Filled 2021-05-30: qty 1

## 2021-05-30 MED ORDER — SODIUM CHLORIDE 1 G PO TABS
1.0000 g | ORAL_TABLET | Freq: Three times a day (TID) | ORAL | Status: DC
  Administered 2021-05-30 – 2021-06-03 (×13): 1 g via ORAL
  Filled 2021-05-30 (×16): qty 1

## 2021-05-30 MED ORDER — SUMATRIPTAN SUCCINATE 50 MG PO TABS
25.0000 mg | ORAL_TABLET | ORAL | Status: AC | PRN
  Administered 2021-05-30 (×2): 25 mg via ORAL
  Filled 2021-05-30 (×3): qty 1

## 2021-05-30 NOTE — TOC Initial Note (Addendum)
Transition of Care [facility]) - Initial/Assessment Note    Patient Details  Name: Jeremiah [name] MRN: [id number] Date of Birth: 04-09-1951  Transition of Care [facility]) CM/SW Contact:    Alberteen Sam, LCSW Phone Number: 05/30/2021, 10:20 AM  Clinical Narrative:                  CSW spoke with patient's friend [name] as she reports she is caregiver for patient and patient lives with her, address in chart confirmed.   Agreeable to home health with no agency preference.   Reports having oxygen through [place], however states patient is out of tanks at home and concentrator only lasts 30 min. CSW has relayed concerns to [place] with [employer] who will follow up.   Per Caryl Pina with [facility] patient qualifies for NIV, MD has signed orders which were faxed to [phone].   CSW has sent [facility] referral to [facility] with [facility] pending acceptance at this time.   Expected Discharge Plan: [place] Barriers to Discharge: Continued Medical Work up   Patient Goals and CMS Choice Patient states their goals for this hospitalization and ongoing recovery are:: to go home CMS Medicare.gov Compare Post Acute Care list provided to:: Patient Represenative (must comment) (friend Patent examiner)    Expected Discharge Plan and Services Expected Discharge Plan: Home w Home Health Services                                   HH Arranged: PT, OT [facility] Agency: [place] Date [facility] Agency Contacted: 05/30/21 Time HH Agency Contacted: 58 Representative spoke with at [place]: Tommi Rumps  Prior Living Arrangements/Services   Lives with:: Friends   Do you feel safe going back to the place where you live?: Yes               Activities of Daily Living Home Assistive Devices/Equipment: None ADL Screening (condition at time of admission) Patient's cognitive ability adequate to safely complete daily activities?: Yes Is the patient deaf or have difficulty hearing?: No Does the patient have  difficulty seeing, even when wearing glasses/contacts?: No Does the patient have difficulty concentrating, remembering, or making decisions?: No Patient able to express need for assistance with ADLs?: Yes Does the patient have difficulty dressing or bathing?: No Independently performs ADLs?: Yes (appropriate for developmental age) Does the patient have difficulty walking or climbing stairs?: Yes Weakness of Legs: Both Weakness of Arms/Hands: Both  Permission Sought/Granted                  Emotional Assessment         Alcohol / Substance Use: Not Applicable Psych Involvement: No (comment)  Admission diagnosis:  Acute respiratory failure with hypoxia ([place]) [J96.01] Patient Active Problem List   Diagnosis Date Noted   GERD (gastroesophageal reflux disease) 05/28/2021   Acute respiratory failure with hypoxia ([place]) 05/28/2021   Anemia 10/18/2020   Acute and chronic respiratory failure (acute-on-chronic) ([place]) 10/18/2020   Multifocal pneumonia 06/16/2020   Cellulitis 05/15/2020   COPD, moderate ([place]) 05/14/2020   Nicotine dependence 05/14/2020   Cellulitis of right foot 05/14/2020   Hyponatremia 05/14/2020   Sepsis ([place]) 05/14/2020   HTN (hypertension) 05/14/2020   Abnormal LFTs 05/14/2020   Acute on chronic respiratory failure with hypoxia ([place]) 10/15/2016   Acute bronchitis 10/15/2016   Tobacco dependency 10/15/2016   Other emphysema ([place]) 08/22/2016   PCP:  [facility]:   [facility] [address], [place] [address] Phone: [phone] Fax: [place], [place] [address] Phone: [phone] Fax: [phone]  [place], [place] [address] Phone: [phone] Fax: [phone]     Social Determinants of Health (SDOH)  Interventions    Readmission Risk Interventions Readmission Risk Prevention Plan 10/21/2020 09/27/2020 06/17/2020  Transportation Screening Complete Complete Complete  PCP or Specialist Appt within 3-5 Days Complete Complete Complete  HRI or Home Care Consult Complete Complete Complete  Social Work Consult for Liberal Planning/Counseling Complete Not Complete Complete  SW consult not completed comments - RNCM assigned to patient -  Palliative Care Screening Not Applicable Not Applicable Not Applicable  Medication Review (RN Care Manager) Complete Complete Complete  Some recent data might be hidden

## 2021-05-30 NOTE — Progress Notes (Signed)
PHARMACIST - PHYSICIAN COMMUNICATION  CONCERNING: Antibiotic IV to Oral Route Change Policy  RECOMMENDATION: This patient is receiving azithromycin by the intravenous route.  Based on criteria approved by the Pharmacy and Therapeutics Committee, the antibiotic(s) is/are being converted to the equivalent oral dose form(s).   DESCRIPTION: These criteria include: Patient being treated for a respiratory tract infection, urinary tract infection, cellulitis or clostridium difficile associated diarrhea if on metronidazole The patient is not neutropenic and does not exhibit a GI malabsorption state The patient is eating (either orally or via tube) and/or has been taking other orally administered medications for a least 24 hours The patient is improving clinically and has a Tmax < 100.5  If you have questions about this conversion, please contact the [place]  05/30/21

## 2021-05-30 NOTE — Progress Notes (Signed)
Pt found to be smoking a cigarette in the room by the [employer]. Cigarette put out and disposed of and pt's cigarette cartridge and lighter confiscated. Items placed in pt's medication bin in med room until discharge. AC notified of the incident. Pt stated, "I don't know why I did that."   Earleen Reaper, RN

## 2021-05-30 NOTE — Progress Notes (Signed)
Initial Nutrition Assessment  DOCUMENTATION CODES:  Non-severe (moderate) malnutrition in context of chronic illness  INTERVENTION:  Add Ensure Plus High Protein po TID, each supplement provides 350 kcal and 20 grams of protein.   Add MVI with minerals daily.  Obtain updated weight.  Encourage PO and supplement intake.  NUTRITION DIAGNOSIS:  Moderate Malnutrition related to chronic illness (COPD) as evidenced by mild fat depletion, moderate fat depletion, mild muscle depletion, moderate muscle depletion.  GOAL:  Patient will meet greater than or equal to 90% of their needs  MONITOR:  PO intake, Supplement acceptance, Labs, Weight trends, I & O's  REASON FOR ASSESSMENT:  Consult Assessment of nutrition requirement/status  ASSESSMENT:  71 yo male with a PMH of COPD/chronic RF on 4 L, pneumonia, tobacco use disorder, HTN, anemia, and chronic venous insufficiency presenting with shortness of breath, productive cough, DOE, and chest pain with cough. Admitted with COPD exacerbation and hyponatremia to 120.  Pt sound asleep with Bi-PAP mask on during RD visit. Pt did not awaken to touch or voice.  Yesterday, per Epic, pt ate 100% of breakfast, 50% of lunch, and 100% of dinner. This morning, ate 50% of breakfast.  Per Epic, pt has not been weighed. RD to order admission weight.  Medications: reviewed; Solu-Medrol BID, NaCl 1 gram TID, thiamine  Labs: reviewed; Na 122 (L), K 5.7 (H), Glucose 169 (H), Crt 0.50 (L)  NUTRITION - FOCUSED PHYSICAL EXAM: Flowsheet Row Most Recent Value  Orbital Region Moderate depletion  Upper Arm Region Mild depletion  Thoracic and Lumbar Region Mild depletion  Buccal Region Unable to assess  Temple Region Moderate depletion  Clavicle Bone Region Mild depletion  Clavicle and Acromion Bone Region Mild depletion  Scapular Bone Region Unable to assess  Dorsal Hand Moderate depletion  Patellar Region Mild depletion  Anterior Thigh Region Mild  depletion  Posterior Calf Region Moderate depletion  Edema (RD Assessment) None  Hair Reviewed  Eyes Unable to assess  Mouth Unable to assess  Skin Reviewed  Nails Reviewed   Diet Order:   Diet Order             Diet heart healthy/carb modified Room service appropriate? Yes; Fluid consistency: Thin; Fluid restriction: 1200 mL Fluid  Diet effective now                  EDUCATION NEEDS:  Not appropriate for education at this time  Skin:  Skin Assessment: Reviewed RN Assessment  Last BM:  05/26/21  Height:  Ht Readings from Last 1 Encounters:  05/28/21 6' (1.829 m)   Weight:  Wt Readings from Last 1 Encounters:  10/19/20 77.4 kg   BMI:  Body mass index is 23.15 kg/m.  Estimated Nutritional Needs:  Kcal:  2100-2300 Protein:  95-110 grams Fluid:  >2.1 L  Derrel Nip, RD, LDN (she/her/hers) Clinical Inpatient Dietitian RD Pager/After-Hours/Weekend Pager # in [place]

## 2021-05-30 NOTE — Progress Notes (Signed)
Pt has Chonic respiratory failure secondary to COPD.  Pt has tried and failed bi-level PAP as evident by his ABGs.  Ordering Non-invasive Ventilator to sustain life and reduce future hospitalizations.  [place], [place]

## 2021-05-30 NOTE — Progress Notes (Signed)
Physical Therapy Treatment Patient Details Name: Jeremiah [name] MRN: [id number] DOB: Jun 24, 1950 Today's Date: 05/30/2021   History of Present Illness Jeremiah [name] is a 71 y.o. male who has a history of COPD and pneumonia.  He is smoking but has cut back.  He complains of several days of increasing shortness of breath with thick yellowish phlegm which is producing.  There is possibly a little bit more phlegm than usual.  EMS reports that he was running 82% on room air and went up to 88 and 90% on 4 L.  He became much more awake on 4 L.  He could not remember what had happened before that.    PT Comments    Pt seen for PT tx with pt received in recliner. Pt is able to complete sit>stand with CGA fade to supervision with RW & stand ~2 minutes + 1 minute with BUE support on RW & close supervision. Pt noted to be soiled with old, dry BM & PT attempts to perform peri hygiene while pt focuses on activity/standing tolerance. After first standing trial pt does endorse "seeing stars" when asked about symptoms (MD notified). Provided seated rest break & pt stood again in attempt to take standing BP. Pt then returns to recliner & voices he's "wiped out". Pt left in recliner with all needs met, nurse notified of pt's need for a bath.   Pt on 4L/min via nasal cannula with SPO2 >90% BP checked in RUE: sitting -112/53 mmHg MAP 74, standing BP 109/62 mmHg MAP 76     Recommendations for follow up therapy are one component of a multi-disciplinary discharge planning process, led by the attending physician.  Recommendations may be updated based on patient status, additional functional criteria and insurance authorization.  Follow Up Recommendations  Skilled nursing-short term rehab (<3 hours/day)     Assistance Recommended at Discharge Intermittent Supervision/Assistance  Patient can return home with the following Assistance with cooking/housework;Help with stairs or ramp for entrance;Assist for  transportation;A lot of help with bathing/dressing/bathroom;A lot of help with walking and/or transfers   Equipment Recommendations  Rolling walker (2 wheels)    Recommendations for Other Services       Precautions / Restrictions Precautions Precautions: Fall Restrictions Weight Bearing Restrictions: No     Mobility  Bed Mobility               General bed mobility comments: pt received & left sitting in recliner    Transfers Overall transfer level: Needs assistance Equipment used: Rolling walker (2 wheels) Transfers: Sit to/from Stand Sit to Stand: Min guard, Supervision                Ambulation/Gait                   Stairs             Wheelchair Mobility    Modified Rankin (Stroke Patients Only)       Balance Overall balance assessment: Needs assistance         Standing balance support: Bilateral upper extremity supported, During functional activity Standing balance-Leahy Scale: Fair                              Cognition Arousal/Alertness: Awake/alert Behavior During Therapy: Flat affect Overall Cognitive Status: No family/caregiver present to determine baseline cognitive functioning  General Comments: Pt able to talk through & appear to understand PT's reasoning for recommending STR vs HHPT f/u upon d/c.        Exercises      General Comments        Pertinent Vitals/Pain Pain Assessment Pain Assessment: No/denies pain    Home Living                          Prior Function            PT Goals (current goals can now be found in the care plan section) Acute Rehab PT Goals Patient Stated Goal: get better, go home PT Goal Formulation: With patient Time For Goal Achievement: 06/12/21 Potential to Achieve Goals: Fair Progress towards PT goals: Progressing toward goals    Frequency    Min 2X/week      PT Plan Discharge plan needs to  be updated    Co-evaluation              AM-PAC PT "6 Clicks" Mobility   Outcome Measure  Help needed turning from your back to your side while in a flat bed without using bedrails?: A Little Help needed moving from lying on your back to sitting on the side of a flat bed without using bedrails?: A Little Help needed moving to and from a bed to a chair (including a wheelchair)?: A Little Help needed standing up from a chair using your arms (e.g., wheelchair or bedside chair)?: A Little Help needed to walk in hospital room?: A Little Help needed climbing 3-5 steps with a railing? : A Lot 6 Click Score: 17    End of Session Equipment Utilized During Treatment: Oxygen Activity Tolerance: Patient tolerated treatment well;Patient limited by fatigue Patient left: in chair;with chair alarm set;with call bell/phone within reach Nurse Communication: Mobility status (need for a bath) PT Visit Diagnosis: Unsteadiness on feet (R26.81);Muscle weakness (generalized) (M62.81)     Time: 6773-7366 PT Time Calculation (min) (ACUTE ONLY): 21 min  Charges:  $Therapeutic Activity: 8-22 mins                     Lavone Nian, PT, DPT 05/30/21, 3:07 PM    Waunita Schooner 05/30/2021, 3:01 PM

## 2021-05-30 NOTE — Evaluation (Signed)
Occupational Therapy Evaluation Patient Details Name: Samuele Storey MRN: [id number] DOB: Mar 28, 1951 Today's Date: 05/30/2021   History of Present Illness Bergen Magner is a 71 y.o. male who has a history of COPD and pneumonia.  He is smoking but has cut back.  He complains of several days of increasing shortness of breath with thick yellowish phlegm which is producing.  There is possibly a little bit more phlegm than usual.  EMS reports that he was running 82% on room air and went up to 88 and 90% on 4 L.  He became much more awake on 4 L.  He could not remember what had happened before that.   Clinical Impression   Pt seen for OT evaluation this date. Upon arrival to room, pt awake and seated upright in bed on 4L/min of O2. Pt agreeable to OT evaluation. Prior to admission, pt was mod-independent in all ADLs and functional mobility, living in a 1-story home with friends. Pt on 4 liters of O2 at home.   This date, pt performed supine>sit transfer MOD-I. Pt currently requires MIN GUARD for sit<>stand transfer and MIN GUARD for functional mobility of short household distances (69ft) with RW d/t decreased activity tolerance and strength. Of note, SpO2 desat 83% following ambulation of 67ft. After 2 mins of PLB, SpO2 87-88%. O2 titrated to 5L/min, and SpO2 increased to 91% within 30 sec. During OOB mobility, pt's clothing and bed sheets were observed to be soiled in urine/stool. Pt required MOD A for doffing overhead shirt while sitting in recliner and MOD A for doffing pants sit>stand. Following sit>stand transfer for doffing pants, SpO2 desat to 84% (while on 5L of O2) and pt endorsed visual changes; RN informed. At end of session, pt was left sitting in recliner, on 5L/min with SpO2 90% and pt in no acute distress. Pt would benefit from additional skilled OT services to maximize return to PLOF and minimize risk of future falls, injury, caregiver burden, and readmission. Upon discharge,  recommend SNF.     Recommendations for follow up therapy are one component of a multi-disciplinary discharge planning process, led by the attending physician.  Recommendations may be updated based on patient status, additional functional criteria and insurance authorization.   Follow Up Recommendations  Skilled nursing-short term rehab (<3 hours/day)    Assistance Recommended at Discharge Frequent or constant Supervision/Assistance  Patient can return home with the following A little help with walking and/or transfers;A lot of help with bathing/dressing/bathroom    Functional Status Assessment  Patient has had a recent decline in their functional status and demonstrates the ability to make significant improvements in function in a reasonable and predictable amount of time.  Equipment Recommendations  Other (comment) (defer to next venue of care)       Precautions / Restrictions Precautions Precautions: Fall Restrictions Weight Bearing Restrictions: No      Mobility Bed Mobility Overal bed mobility: Modified Independent Bed Mobility: Supine to Sit     Supine to sit: Modified independent (Device/Increase time)     General bed mobility comments: HOB elevated, requiring increased time. Increased work of breathing with task.    Transfers Overall transfer level: Needs assistance Equipment used: Rolling walker (2 wheels) Transfers: Sit to/from Stand Sit to Stand: Min guard                  Balance Overall balance assessment: Needs assistance Sitting-balance support: Feet supported, Bilateral upper extremity supported Sitting balance-Leahy Scale: Good Sitting balance -  Comments: Pt sitting with forward lean forearms propped on elbows d/t SOB.   Standing balance support: Bilateral upper extremity supported, During functional activity Standing balance-Leahy Scale: Fair Standing balance comment: Requires MIN GUARD for functional mobility of short household distances  (71ft)                           ADL either performed or assessed with clinical judgement   ADL Overall ADL's : Needs assistance/impaired Eating/Feeding: Independent;Sitting Eating/Feeding Details (indicate cue type and reason): to drink cup of coffee Grooming: Wash/dry hands;Set up;Sitting   Upper Body Bathing: Set up;Sitting       Upper Body Dressing : Moderate assistance;Sitting Upper Body Dressing Details (indicate cue type and reason): Requires assist to doff overhead shirt from L arm and initiate doffing over head Lower Body Dressing: Moderate assistance;Sit to/from stand Lower Body Dressing Details (indicate cue type and reason): Able to pull pants off from waist in standing, but requires MOD A to doff from knees and feet             Functional mobility during ADLs: Min guard;Rolling walker (2 wheels) (requires verbal cues for navigating RW around environmental obstacles)        Pertinent Vitals/Pain Pain Assessment Pain Assessment: Faces Faces Pain Scale: Hurts even more Pain Location: head (migrane) Pain Descriptors / Indicators: Aching Pain Intervention(s): Monitored during session, Patient requesting pain meds-RN notified     Hand Dominance Right   Extremity/Trunk Assessment Upper Extremity Assessment Upper Extremity Assessment: Generalized weakness   Lower Extremity Assessment Lower Extremity Assessment: Generalized weakness       Communication Communication Communication: No difficulties   Cognition Arousal/Alertness: Awake/alert Behavior During Therapy: Anxious Overall Cognitive Status: No family/caregiver present to determine baseline cognitive functioning                                 General Comments: Pt with decreased safety awareness and endosing anxiety regarding dressing and functional mobility     General Comments  While on 4L, SpO2 desat 83% following ambulation of 48ft. After 2 mins, SpO2 87-88%. O2  titrated to 5L/min, with SpO2 increasing to 91% within 30 sec. While on 5L, SpO2 desat to 84% following second sit>stand transfer, with pt endorsing visual changes. Pt left sitting in recliner, on 5L/min with SpO2 90% and pt in no acute distress. RN informed.            Home Living Family/patient expects to be discharged to:: Private residence Living Arrangements: Non-relatives/Friends Available Help at Discharge: Friend(s);Available PRN/intermittently Type of Home: Mobile home Home Access: Stairs to enter Entrance Stairs-Number of Steps: 10 at front, bilat rails; 2 at back door, but unable to access back door (taken from prior visit) Entrance Stairs-Rails: Right;Left Home Layout: One level     Bathroom Shower/Tub: Walk-in shower;Sponge bathes at baseline   [employer]: Standard     Home Equipment: Public relations account executive (2 wheels)          Prior Functioning/Environment Prior Level of Function : Independent/Modified Independent             Mobility Comments: Pt reports using RW for functional mobility ADLs Comments: Pt reports MOD-I (increased time/effort) for ADLs. Friends assist with meals        OT Problem List: Decreased strength;Decreased activity tolerance;Impaired balance (sitting and/or standing);Decreased safety awareness;Cardiopulmonary status limiting activity;Pain  OT Treatment/Interventions: Self-care/ADL training;Therapeutic exercise;Energy conservation;DME and/or AE instruction;Therapeutic activities;Patient/family education;Balance training    OT Goals(Current goals can be found in the care plan section) Acute Rehab OT Goals Patient Stated Goal: to go home OT Goal Formulation: With patient Time For Goal Achievement: 06/13/21 Potential to Achieve Goals: Good ADL Goals Pt Will Perform Lower Body Bathing: with min assist;sit to/from stand Pt Will Perform Upper Body Dressing: with set-up;with supervision;sitting Pt Will Transfer to Toilet:  ambulating;with supervision;bedside commode  OT Frequency: Min 2X/week       AM-PAC OT "6 Clicks" Daily Activity     Outcome Measure Help from another person eating meals?: None Help from another person taking care of personal grooming?: A Little Help from another person toileting, which includes using toliet, bedpan, or urinal?: A Lot Help from another person bathing (including washing, rinsing, drying)?: A Lot Help from another person to put on and taking off regular upper body clothing?: A Lot Help from another person to put on and taking off regular lower body clothing?: A Lot 6 Click Score: 15   End of Session Equipment Utilized During Treatment: Rolling walker (2 wheels);Oxygen Nurse Communication: Mobility status;Other (comment) (SpO2)  Activity Tolerance: Patient tolerated treatment well Patient left: in chair  OT Visit Diagnosis: Unsteadiness on feet (R26.81);Muscle weakness (generalized) (M62.81)                Time: 2505-3976 OT Time Calculation (min): 45 min Charges:  OT General Charges $OT Visit: 1 Visit OT Evaluation $OT Eval Moderate Complexity: 1 Mod OT Treatments $Self Care/Home Management : 23-37 mins  Fredirick Maudlin, OTR/L [place]

## 2021-05-30 NOTE — Discharge Instructions (Signed)

## 2021-05-30 NOTE — Progress Notes (Signed)
Patient ripped BIPAP mask off. He was on RA when I entered the room with saturation of 84%. I placed patient back on o2 and increased to 5l. Nebulizer given

## 2021-05-30 NOTE — Progress Notes (Signed)
PROGRESS NOTE  Jeremiah [name] GXQ:[id number] DOB: 1950/11/04   PCP: [facility]  Patient is from: Lives with his friend.  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  Reportedly saturating at 82% on RA when EMS arrived but recovered to 90% on 4 L.  In ED, he desaturated to 84% on 4 L by [place].  VBG with compensated respiratory acidosis.  BNP 101.  Serial troponin negative.  WBC 14.  CTA chest negative for PE but severe emphysema, PAH, multifocal symmetric peripheral pulmonary infiltrates concerning for atypical infection/inflammation, progressive, pathologic right hilar adenopathy and stable 12 mm RLL pulmonary nodule patient was started on BiPAP, IV Solu-Medrol and nebulizers.  RVP positive for rhinovirus.  Subjective: Seen and examined earlier this morning.  Per RN, "Patient found smoking cigarettes in the room overnight. pt's cigarette cartridge and lighter confiscated. Items placed in pt's medication bin in med room until discharge. AC notified of the incident. Pt stated, "I don't know why I did that."  He is on BiPAP this morning.  No complaints.  Still with shortness of breath and productive cough.   Objective: Vitals:   05/30/21 0427 05/30/21 0742 05/30/21 0744 05/30/21 0746  BP: 123/60  (!) 117/45 (!) 141/62  Pulse: 60 68 70 69  Resp: 20 (!) 24 (!) 24   Temp: 98.5 F (36.9 C)  98 F (36.7 C)   TempSrc: Oral     SpO2: 97% 98% 100%   Height:        Examination:  GENERAL: Frail looking elderly male.  Nontoxic. HEENT: MMM.  Vision and hearing grossly intact.  NECK: Supple.  No apparent  JVD.  RESP: On BiPAP.  No IWOB.  Rhonchi bilaterally.  Somewhat diminished aeration bilaterally. CVS:  RRR. Heart sounds normal.  ABD/GI/GU: BS+. Abd soft, NTND.  MSK/EXT:  Moves extremities.  Significant muscle mass and subcu fat loss.  BLE edema with chronic venous insufficiency SKIN: no apparent skin lesion or wound NEURO: Awake and alert. Oriented appropriately.  No apparent focal neuro deficit. PSYCH: Calm. Normal affect.   Procedures:  None  Microbiology summarized: [id number] and influenza PCR nonreactive. Full RVP positive for rhinovirus.  Assessment & Plan: Acute on chronic respiratory failure with hypoxia and hypercapnia due to COPD exacerbation in the setting of viral URI and ongoing tobacco use: VBG with compensated respiratory acidosis with hypercapnia.  Desaturated to 84% on 4 L by [place] requiring BiPAP in ED.  Still with shortness of breath, productive cough, dyspnea on exertion, and diminished aeration bilaterally.  CT chest raises concern for severe emphysema, atypical infection and PAH although he had normal RVSP on his TTE in 06/2020.  Procalcitonin negative.  RVP positive for rhinovirus. -Wean oxygen-minimum oxygen to keep saturation above 88% regardless of home 4 L -BiPAP as needed -Continue IV Solu-Medrol, scheduled and as needed nebulizers -Continue azithromycin -Encouraged tobacco cessation. -IS/OOB/PT/OT  Hyponatremia: Somewhat chronic.  Baseline seems to be upper 120s.  Slightly elevated urine sodium in the past suggesting some element of SIADH. Recent Labs  Lab 05/28/21 1642 05/29/21 0448 05/29/21 0725 05/29/21 1446 05/29/21 2255 05/30/21 0436 05/30/21 6314  NA 124* 120* 122* 120* 122* 122* 122*  -Urine chemistry pending-requested RN to send -Start p.o. NaCl 1 g 3 times daily -Fluid restriction -Monitor sodium.  Hyperkalemia: K5.7. -P.o. Lokelma 10 g x 1  Essential hypertension: Normotensive -Continue home Cardizem.  Tobacco use disorder: Reports  smoking about half a pack a day -Encouraged cessation. -Nicotine patch  Pulmonary nodule: CTA showed stable 12 mm RLL nodule -Repeat CT in 12 months.  Physical deconditioning-patient lives with friends.  Gets around holding to stuff in the house.  Has no walker. -PT/OT recommended SNF.  Leukocytosis/bandemia: Likely demargination from steroid. -Monitor  Moderate malnutrition-patient with significant muscle mass and subcu fat loss, lower extremity edema Body mass index is 23.15 kg/m. Wt Readings from Last 10 Encounters:  10/19/20 77.4 kg  09/26/20 75.3 kg  06/17/20 85 kg  05/17/20 93.6 kg  01/27/20 83 kg  12/09/19 82.6 kg  10/20/16 87.2 kg  08/19/15 111.9 kg  07/28/15 108 kg  -Consult dietitian          DVT prophylaxis:  heparin injection 5,000 Units Start: 05/28/21 2200  Code Status: Full code Family Communication: Patient and/or RN. Available if any question.  Level of care: Progressive Status is: Inpatient  Remains inpatient appropriate because: Acute on chronic respiratory failure with hypoxia and hypercapnia due to COPD exacerbation, hypokalemia and hyponatremia   Final disposition: Likely SNF.    Consultants:  None   Sch Meds:  Scheduled Meds:  atorvastatin  40 mg Oral QPM   azithromycin  500 mg Oral Daily   diltiazem  120 mg Oral BID   heparin  5,000 Units Subcutaneous Q8H   ipratropium-albuterol  3 mL Nebulization Q6H   methylPREDNISolone (SOLU-MEDROL) injection  60 mg Intravenous Q12H   montelukast  10 mg Oral QHS   nicotine  21 mg Transdermal Daily   pantoprazole  20 mg Oral Daily   sodium chloride flush  3 mL Intravenous Q12H   sodium chloride  1 g Oral TID WC   thiamine  100 mg Oral Daily   Continuous Infusions:   PRN Meds:.acetaminophen **OR** acetaminophen, albuterol, guaiFENesin, hydrALAZINE, ipratropium-albuterol  Antimicrobials: Anti-infectives (From admission, onward)    Start     Dose/Rate Route Frequency Ordered Stop    05/30/21 1800  azithromycin (ZITHROMAX) tablet 500 mg        500 mg Oral Daily 05/30/21 0750 06/01/21 1759   05/29/21 1700  azithromycin (ZITHROMAX) 500 mg in sodium chloride 0.9 % 250 mL IVPB  Status:  Discontinued        500 mg 250 mL/hr over 60 Minutes Intravenous Every 24 hours 05/29/21 0657 05/30/21 0750   05/28/21 1730  levofloxacin (LEVAQUIN) IVPB 750 mg        750 mg 100 mL/hr over 90 Minutes Intravenous  Once 05/28/21 1723 05/28/21 1858        I have personally reviewed the following labs and images: CBC: Recent Labs  Lab 05/28/21 1642 05/28/21 2023 05/29/21 0448 05/30/21 0436  WBC 19.3* 17.6* 13.8* 16.5*  NEUTROABS 17.6*  --   --   --   HGB 11.5* 11.3* 10.5* 10.9*  HCT 35.9* 34.4* 32.4* 33.4*  MCV 86.1 85.1 84.6 84.8  PLT 369 367 340 356   BMP &GFR Recent Labs  Lab 05/28/21 1642 05/28/21 2023 05/29/21 0448 05/29/21 0725 05/29/21 1446 05/29/21 2255 05/30/21 0436 05/30/21 0654  NA 124*  --  120* 122* 120* 122* 122* 122*  K 4.8  --  4.5  --   --   --  5.7*  --   CL 83*  --  83*  --   --   --  86*  --   CO2 31  --  30  --   --   --  32  --   GLUCOSE 81  --  197*  --   --   --  169*  --   BUN 14  --  15  --   --   --  22  --   CREATININE 0.40* 0.50* 0.51*  --   --   --  0.50*  --   CALCIUM 8.9  --  8.6*  --   --   --  8.5*  --   MG  --   --   --   --   --   --  2.1  --   PHOS  --   --   --   --   --   --  3.0  --    CrCl cannot be calculated (Unknown ideal weight.). Liver & Pancreas: Recent Labs  Lab 05/28/21 1642 05/30/21 0436  AST 27  --   ALT 18  --   ALKPHOS 219*  --   BILITOT 0.7  --   PROT 8.2*  --   ALBUMIN 3.6 2.9*   No results for input(s): LIPASE, AMYLASE in the last 168 hours. No results for input(s): AMMONIA in the last 168 hours. Diabetic: No results for input(s): HGBA1C in the last 72 hours. No results for input(s): GLUCAP in the last 168 hours. Cardiac Enzymes: No results for input(s): CKTOTAL, CKMB, CKMBINDEX, TROPONINI in the  last 168 hours. No results for input(s): PROBNP in the last 8760 hours. Coagulation Profile: No results for input(s): INR, PROTIME in the last 168 hours. Thyroid Function Tests: Recent Labs    05/30/21 0436  TSH 0.416   Lipid Profile: No results for input(s): CHOL, HDL, LDLCALC, TRIG, CHOLHDL, LDLDIRECT in the last 72 hours. Anemia Panel: No results for input(s): VITAMINB12, FOLATE, FERRITIN, TIBC, IRON, RETICCTPCT in the last 72 hours. Urine analysis:    Component Value Date/Time   COLORURINE YELLOW (A) 06/10/2020 0220   APPEARANCEUR CLEAR (A) 06/10/2020 0220   APPEARANCEUR Clear 09/12/2013 0920   LABSPEC 1.012 06/10/2020 0220   LABSPEC 1.010 09/12/2013 0920   PHURINE 5.0 06/10/2020 0220   GLUCOSEU NEGATIVE 06/10/2020 0220   GLUCOSEU Negative 09/12/2013 0920   HGBUR NEGATIVE 06/10/2020 0220   BILIRUBINUR NEGATIVE 06/10/2020 0220   BILIRUBINUR Negative 09/12/2013 0920   KETONESUR NEGATIVE 06/10/2020 0220   PROTEINUR NEGATIVE 06/10/2020 0220   NITRITE NEGATIVE 06/10/2020 0220   LEUKOCYTESUR NEGATIVE 06/10/2020 0220   LEUKOCYTESUR Negative 09/12/2013 0920   Sepsis Labs: Invalid input(s): PROCALCITONIN, [place]  Microbiology: Recent Results (from the past 240 hour(s))  Resp Panel by RT-PCR (Flu A&B, Covid) Nasopharyngeal Swab     Status: None   Collection Time: 05/28/21  5:06 PM   Specimen: Nasopharyngeal Swab; Nasopharyngeal(NP) swabs in vial transport medium  Result Value Ref Range Status   SARS Coronavirus 2 by RT PCR NEGATIVE NEGATIVE Final    Comment: (NOTE) SARS-CoV-2 target nucleic acids are NOT DETECTED.  The SARS-CoV-2 RNA is generally detectable in upper respiratory specimens during the acute phase of infection. The lowest concentration of SARS-CoV-2 viral copies this assay can detect is 138 copies/mL. A negative result does not preclude SARS-Cov-2 infection and should not be used as the sole basis for treatment or other patient management decisions. A  negative result may occur with  improper specimen collection/handling, submission of specimen other than nasopharyngeal swab, presence of viral mutation(s) within the areas targeted by this assay, and inadequate number of viral copies(<138 copies/mL). A negative result must be combined with clinical observations, patient history, and epidemiological information. The expected result is Negative.  Fact Sheet for Patients:  EntrepreneurPulse.com.au  Fact Sheet for Healthcare Providers:  IncredibleEmployment.be  This test is no t yet approved or cleared by the [place] FDA and  has been authorized for detection and/or diagnosis of SARS-CoV-2 by FDA under an Emergency Use Authorization (EUA). This EUA will remain  in effect (meaning this test can be used) for the duration of the COVID-19 declaration under Section 564(b)(1) of the Act, 21 U.S.C.section 360bbb-3(b)(1), unless the authorization is terminated  or revoked sooner.       Influenza A by PCR NEGATIVE NEGATIVE Final   Influenza B by PCR NEGATIVE NEGATIVE Final    Comment: (NOTE) The Xpert Xpress SARS-CoV-2/FLU/RSV plus assay is intended as an aid in the diagnosis of influenza from Nasopharyngeal swab specimens and should not be used as a sole basis for treatment. Nasal washings and aspirates are unacceptable for Xpert Xpress SARS-CoV-2/FLU/RSV testing.  Fact Sheet for Patients: EntrepreneurPulse.com.au  Fact Sheet for Healthcare Providers: IncredibleEmployment.be  This test is not yet approved or cleared by the [place] FDA and has been authorized for detection and/or diagnosis of SARS-CoV-2 by FDA under an Emergency Use Authorization (EUA). This EUA will remain in effect (meaning this test can be used) for the duration of the COVID-19 declaration under Section 564(b)(1) of the Act, 21 U.S.C. section 360bbb-3(b)(1), unless the authorization  is terminated or revoked.  Performed at [facility], [place], [place] [address]   Respiratory (~20 pathogens) panel by PCR     Status: Abnormal   Collection Time: 05/29/21 12:37 PM   Specimen: Nasopharyngeal Swab; Respiratory  Result Value Ref Range Status   Adenovirus NOT DETECTED NOT DETECTED Final   Coronavirus 229E NOT DETECTED NOT DETECTED Final    Comment: (NOTE) The Coronavirus on the Respiratory Panel, DOES NOT test for the novel  Coronavirus (2019 nCoV)    Coronavirus HKU1 NOT DETECTED NOT DETECTED Final   Coronavirus NL63 NOT DETECTED NOT DETECTED Final   Coronavirus OC43 NOT DETECTED NOT DETECTED Final   Metapneumovirus NOT DETECTED NOT DETECTED Final   Rhinovirus / Enterovirus DETECTED (A) NOT DETECTED Final   Influenza A NOT DETECTED NOT DETECTED Final   Influenza B NOT DETECTED NOT DETECTED Final   Parainfluenza Virus 1 NOT DETECTED NOT DETECTED Final   Parainfluenza Virus 2 NOT DETECTED NOT DETECTED Final   Parainfluenza Virus 3 NOT DETECTED NOT DETECTED Final   Parainfluenza Virus 4 NOT DETECTED NOT DETECTED Final   Respiratory Syncytial Virus NOT DETECTED NOT DETECTED Final   Bordetella pertussis NOT DETECTED NOT DETECTED Final   Bordetella Parapertussis NOT DETECTED NOT DETECTED Final   Chlamydophila pneumoniae NOT DETECTED NOT DETECTED Final   Mycoplasma pneumoniae NOT DETECTED NOT DETECTED Final    Comment: Performed at [facility], [place]. [address] [place], [place] [address]    Radiology Studies: No results found.     Branndon Tuite T. [place]  If 7PM-7AM, please contact night-coverage [email or web address] 05/30/2021, 12:25 PM

## 2021-05-31 DIAGNOSIS — E440 Moderate protein-calorie malnutrition: Secondary | ICD-10-CM | POA: Insufficient documentation

## 2021-05-31 LAB — MAGNESIUM: Magnesium: 2.1 mg/dL (ref 1.7–2.4)

## 2021-05-31 LAB — CBC
HCT: 34.7 % — ABNORMAL LOW (ref 39.0–52.0)
Hemoglobin: 11.2 g/dL — ABNORMAL LOW (ref 13.0–17.0)
MCH: 27.9 pg (ref 26.0–34.0)
MCHC: 32.3 g/dL (ref 30.0–36.0)
MCV: 86.5 fL (ref 80.0–100.0)
Platelets: 378 10*3/uL (ref 150–400)
RBC: 4.01 MIL/uL — ABNORMAL LOW (ref 4.22–5.81)
RDW: 16.9 % — ABNORMAL HIGH (ref 11.5–15.5)
WBC: 11.5 10*3/uL — ABNORMAL HIGH (ref 4.0–10.5)
nRBC: 0 % (ref 0.0–0.2)

## 2021-05-31 LAB — GLUCOSE, CAPILLARY: Glucose-Capillary: 150 mg/dL — ABNORMAL HIGH (ref 70–99)

## 2021-05-31 LAB — RENAL FUNCTION PANEL
Albumin: 2.9 g/dL — ABNORMAL LOW (ref 3.5–5.0)
Anion gap: 5 (ref 5–15)
BUN: 20 mg/dL (ref 8–23)
CO2: 37 mmol/L — ABNORMAL HIGH (ref 22–32)
Calcium: 8.7 mg/dL — ABNORMAL LOW (ref 8.9–10.3)
Chloride: 86 mmol/L — ABNORMAL LOW (ref 98–111)
Creatinine, Ser: 0.47 mg/dL — ABNORMAL LOW (ref 0.61–1.24)
GFR, Estimated: >60 mL/min (ref >60)
Glucose, Bld: 136 mg/dL — ABNORMAL HIGH (ref 70–99)
Phosphorus: 2.5 mg/dL (ref 2.5–4.6)
Potassium: 5.3 mmol/L — ABNORMAL HIGH (ref 3.5–5.1)
Sodium: 128 mmol/L — ABNORMAL LOW (ref 135–145)

## 2021-05-31 LAB — SODIUM, URINE, RANDOM: Sodium, Ur: <10 mmol/L

## 2021-05-31 LAB — PROCALCITONIN: Procalcitonin: <0.1 ng/mL

## 2021-05-31 LAB — OSMOLALITY, URINE: Osmolality, Ur: 463 mOsm/kg (ref 300–900)

## 2021-05-31 MED ORDER — PREDNISONE 20 MG PO TABS
40.0000 mg | ORAL_TABLET | Freq: Every day | ORAL | Status: DC
  Administered 2021-06-01 – 2021-06-03 (×3): 40 mg via ORAL
  Filled 2021-05-31 (×3): qty 2

## 2021-05-31 MED ORDER — SUMATRIPTAN SUCCINATE 50 MG PO TABS
25.0000 mg | ORAL_TABLET | ORAL | Status: AC | PRN
  Administered 2021-05-31 – 2021-06-02 (×2): 25 mg via ORAL
  Filled 2021-05-31 (×3): qty 1

## 2021-05-31 MED ORDER — METHYLPREDNISOLONE SODIUM SUCC 125 MG IJ SOLR
60.0000 mg | Freq: Two times a day (BID) | INTRAMUSCULAR | Status: AC
  Administered 2021-05-31: 17:00:00 60 mg via INTRAVENOUS
  Filled 2021-05-31: qty 2

## 2021-05-31 MED ORDER — SODIUM ZIRCONIUM CYCLOSILICATE 10 G PO PACK
10.0000 g | PACK | Freq: Once | ORAL | Status: AC
  Administered 2021-05-31: 13:00:00 10 g via ORAL
  Filled 2021-05-31: qty 1

## 2021-05-31 MED ORDER — IPRATROPIUM-ALBUTEROL 0.5-2.5 (3) MG/3ML IN SOLN
3.0000 mL | Freq: Three times a day (TID) | RESPIRATORY_TRACT | Status: DC
  Administered 2021-06-01 – 2021-06-03 (×8): 3 mL via RESPIRATORY_TRACT
  Filled 2021-05-31 (×8): qty 3

## 2021-05-31 NOTE — Progress Notes (Signed)
PROGRESS NOTE  Jeremiah [name] EHM:[id number] DOB: [birth year]   PCP: [facility]  Patient is from: Lives with his friend.  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  Reportedly saturating at 82% on RA when EMS arrived but recovered to 90% on 4 L.  In ED, he desaturated to 84% on 4 L by [place].  VBG with compensated respiratory acidosis.  BNP 101.  Serial troponin negative.  WBC 14.  CTA chest negative for PE but severe emphysema, PAH, multifocal symmetric peripheral pulmonary infiltrates concerning for atypical infection/inflammation, progressive, pathologic right hilar adenopathy and stable 12 mm RLL pulmonary nodule patient was started on BiPAP, IV Solu-Medrol and nebulizers.  RVP positive for rhinovirus.  TOC working on home trilogy vent.  Therapy recommended SNF.  Subjective: Seen and examined earlier this morning.  Continues to endorse shortness of breath and productive cough with yellowish phlegm.  He is anxious about going to rehab but he understands.  Denies chest pain, GI or UTI symptoms.  Objective: Vitals:   05/31/21 0839 05/31/21 0843 05/31/21 1115 05/31/21 1400  BP: (!) 124/57 (!) 124/57 125/66 140/60  Pulse: 78  77 75  Resp:   20   Temp: 98.1 F (36.7 C)  98 F (36.7 C)   TempSrc: Oral  Oral   SpO2: 92%  94% 93%  Weight:      Height:        Examination:  GENERAL: Frail looking elderly male.  Nontoxic. HEENT: MMM.  Vision and hearing grossly intact.  NECK: Supple.  No apparent JVD.  RESP: 93% on 3 L.  Some WOB.  Fair aeration bilaterally.  Rhonchi bilaterally CVS:  RRR. Heart  sounds normal.  ABD/GI/GU: BS+. Abd soft, NTND.  MSK/EXT:  Moves extremities.  Significant muscle mass and subcu fat loss.  BLE edema. SKIN: Signs of chronic venous insufficiency in BLE. NEURO: Awake and alert. Oriented appropriately.  No apparent focal neuro deficit. PSYCH: Somewhat anxious.  Procedures:  None  Microbiology summarized: [id number] and influenza PCR nonreactive. Full RVP positive for rhinovirus.  Assessment & Plan: Acute on chronic respiratory failure with hypoxia and hypercapnia due to COPD exacerbation in the setting of viral URI and ongoing tobacco use: CTA negative for PE. RVP positive for rhinovirus. -Wean oxygen-minimum oxygen to keep saturation above 88% regardless of home 4 L -BiPAP as needed -Continue IV Solu-Medrol, scheduled and as needed nebulizers -Change to p.o. prednisone in the morning. -Continue azithromycin -Encouraged tobacco cessation. -IS/OOB/PT/OT -TOC working on home trilogy vent.  Hyponatremia: Somewhat chronic.  Baseline in upper 120s.  Urine sodium <10 arguing against SIADH but hyponatremia improved with fluid restriction and p.o. sodium chloride.  Hyperaldosteronism? Recent Labs  Lab 05/28/21 1642 05/29/21 0448 05/29/21 0725 05/29/21 1446 05/29/21 2255 05/30/21 0436 05/30/21 0654 05/30/21 1448 05/30/21 2253 05/31/21 0634  NA 124* 120* 122* 120* 122* 122* 122* 120* 125* 128*  -Continue p.o. sodium chloride -Monitor sodium -Consider discontinue lisinopril on discharge  Hyperkalemia: K 5.3. -P.o. Lokelma 10 g x 1  Essential hypertension: Normotensive -Continue home Cardizem.  Tobacco use disorder: Reports smoking about half a pack a day -Encouraged  cessation. -Nicotine patch  Pulmonary nodule: CTA showed stable 12 mm RLL nodule -Repeat CT in 12 months.  Physical deconditioning-patient lives with friends.  Gets around holding to stuff in the house.  Has no walker. -PT/OT recommended SNF.  Leukocytosis/bandemia: Likely  demargination from steroid. -Monitor  Migraine headache -Imitrex as needed -Tylenol as needed  Moderate malnutrition-patient with significant muscle mass and subcu fat loss, lower extremity edema Body mass index is 21.26 kg/m. Wt Readings from Last 10 Encounters:  05/31/21 71.1 kg  10/19/20 77.4 kg  09/26/20 75.3 kg  06/17/20 85 kg  05/17/20 93.6 kg  01/27/20 83 kg  12/09/19 82.6 kg  10/20/16 87.2 kg  08/19/15 111.9 kg  07/28/15 108 kg   Nutrition Problem: Moderate Malnutrition Etiology: chronic illness (COPD) Signs/Symptoms: mild fat depletion, moderate fat depletion, mild muscle depletion, moderate muscle depletion Interventions: Ensure Enlive (each supplement provides 350kcal and 20 grams of protein), MVI   DVT prophylaxis:  heparin injection 5,000 Units Start: 05/28/21 2200  Code Status: Full code Family Communication: Patient and/or RN. Available if any question.  Level of care: Progressive Status is: Inpatient  Remains inpatient appropriate because: Acute on chronic respiratory failure with hypoxia and hypercapnia due to COPD exacerbation, hypokalemia and hyponatremia   Final disposition: Likely SNF when bed available.    Consultants:  None   Sch Meds:  Scheduled Meds:  atorvastatin  40 mg Oral QPM   azithromycin  500 mg Oral Daily   diltiazem  120 mg Oral BID   feeding supplement  237 mL Oral TID BM   heparin  5,000 Units Subcutaneous Q8H   ipratropium-albuterol  3 mL Nebulization Q6H   methylPREDNISolone (SOLU-MEDROL) injection  60 mg Intravenous Q12H   montelukast  10 mg Oral QHS   multivitamin with minerals  1 tablet Oral Daily   nicotine  21 mg Transdermal Daily   pantoprazole  20 mg Oral Daily   [START ON 06/01/2021] predniSONE  40 mg Oral Q breakfast   sodium chloride flush  3 mL Intravenous Q12H   sodium chloride  1 g Oral TID WC   thiamine  100 mg Oral Daily   Continuous Infusions:   PRN Meds:.acetaminophen **OR** acetaminophen,  albuterol, guaiFENesin, hydrALAZINE, ipratropium-albuterol, SUMAtriptan  Antimicrobials: Anti-infectives (From admission, onward)    Start     Dose/Rate Route Frequency Ordered Stop   05/30/21 1800  azithromycin (ZITHROMAX) tablet 500 mg        500 mg Oral Daily 05/30/21 0750 06/01/21 1759   05/29/21 1700  azithromycin (ZITHROMAX) 500 mg in sodium chloride 0.9 % 250 mL IVPB  Status:  Discontinued        500 mg 250 mL/hr over 60 Minutes Intravenous Every 24 hours 05/29/21 0657 05/30/21 0750   05/28/21 1730  levofloxacin (LEVAQUIN) IVPB 750 mg        750 mg 100 mL/hr over 90 Minutes Intravenous  Once 05/28/21 1723 05/28/21 1858        I have personally reviewed the following labs and images: CBC: Recent Labs  Lab 05/28/21 1642 05/28/21 2023 05/29/21 0448 05/30/21 0436 05/31/21 0634  WBC 19.3* 17.6* 13.8* 16.5* 11.5*  NEUTROABS 17.6*  --   --   --   --   HGB 11.5* 11.3* 10.5* 10.9* 11.2*  HCT 35.9* 34.4* 32.4* 33.4* 34.7*  MCV 86.1 85.1 84.6 84.8 86.5  PLT 369 367 340 356 378   BMP &GFR Recent Labs  Lab 05/28/21 1642 05/28/21 2023 05/29/21 0448 05/29/21 0725  05/30/21 0436 05/30/21 0654 05/30/21 1448 05/30/21 2253 05/31/21 0634  NA 124*  --  120*   < > 122* 122* 120* 125* 128*  K 4.8  --  4.5  --  5.7*  --   --   --  5.3*  CL 83*  --  83*  --  86*  --   --   --  86*  CO2 31  --  30  --  32  --   --   --  37*  GLUCOSE 81  --  197*  --  169*  --   --   --  136*  BUN 14  --  15  --  22  --   --   --  20  CREATININE 0.40* 0.50* 0.51*  --  0.50*  --   --   --  0.47*  CALCIUM 8.9  --  8.6*  --  8.5*  --   --   --  8.7*  MG  --   --   --   --  2.1  --   --   --  2.1  PHOS  --   --   --   --  3.0  --   --   --  2.5   < > = values in this interval not displayed.   Estimated Creatinine Clearance: 86.4 mL/min (A) (by C-G formula based on SCr of 0.47 mg/dL (L)). Liver & Pancreas: Recent Labs  Lab 05/28/21 1642 05/30/21 0436 05/31/21 0634  AST 27  --   --   ALT 18   --   --   ALKPHOS 219*  --   --   BILITOT 0.7  --   --   PROT 8.2*  --   --   ALBUMIN 3.6 2.9* 2.9*   No results for input(s): LIPASE, AMYLASE in the last 168 hours. No results for input(s): AMMONIA in the last 168 hours. Diabetic: No results for input(s): HGBA1C in the last 72 hours. No results for input(s): GLUCAP in the last 168 hours. Cardiac Enzymes: No results for input(s): CKTOTAL, CKMB, CKMBINDEX, TROPONINI in the last 168 hours. No results for input(s): PROBNP in the last 8760 hours. Coagulation Profile: No results for input(s): INR, PROTIME in the last 168 hours. Thyroid Function Tests: Recent Labs    05/30/21 0436  TSH 0.416   Lipid Profile: No results for input(s): CHOL, HDL, LDLCALC, TRIG, CHOLHDL, LDLDIRECT in the last 72 hours. Anemia Panel: No results for input(s): VITAMINB12, FOLATE, FERRITIN, TIBC, IRON, RETICCTPCT in the last 72 hours. Urine analysis:    Component Value Date/Time   COLORURINE YELLOW (A) 06/10/2020 0220   APPEARANCEUR CLEAR (A) 06/10/2020 0220   APPEARANCEUR Clear 09/12/2013 0920   LABSPEC 1.012 06/10/2020 0220   LABSPEC 1.010 09/12/2013 0920   PHURINE 5.0 06/10/2020 0220   GLUCOSEU NEGATIVE 06/10/2020 0220   GLUCOSEU Negative 09/12/2013 0920   HGBUR NEGATIVE 06/10/2020 0220   BILIRUBINUR NEGATIVE 06/10/2020 0220   BILIRUBINUR Negative 09/12/2013 0920   KETONESUR NEGATIVE 06/10/2020 0220   PROTEINUR NEGATIVE 06/10/2020 0220   NITRITE NEGATIVE 06/10/2020 0220   LEUKOCYTESUR NEGATIVE 06/10/2020 0220   LEUKOCYTESUR Negative 09/12/2013 0920   Sepsis Labs: Invalid input(s): PROCALCITONIN, [place]  Microbiology: Recent Results (from the past 240 hour(s))  Resp Panel by RT-PCR (Flu A&B, Covid) Nasopharyngeal Swab     Status: None   Collection Time: 05/28/21  5:06 PM   Specimen: Nasopharyngeal Swab; Nasopharyngeal(NP) swabs in  vial transport medium  Result Value Ref Range Status   SARS Coronavirus 2 by RT PCR NEGATIVE NEGATIVE  Final    Comment: (NOTE) SARS-CoV-2 target nucleic acids are NOT DETECTED.  The SARS-CoV-2 RNA is generally detectable in upper respiratory specimens during the acute phase of infection. The lowest concentration of SARS-CoV-2 viral copies this assay can detect is 138 copies/mL. A negative result does not preclude SARS-Cov-2 infection and should not be used as the sole basis for treatment or other patient management decisions. A negative result may occur with  improper specimen collection/handling, submission of specimen other than nasopharyngeal swab, presence of viral mutation(s) within the areas targeted by this assay, and inadequate number of viral copies(<138 copies/mL). A negative result must be combined with clinical observations, patient history, and epidemiological information. The expected result is Negative.  Fact Sheet for Patients:  EntrepreneurPulse.com.au  Fact Sheet for Healthcare Providers:  IncredibleEmployment.be  This test is no t yet approved or cleared by the [place] FDA and  has been authorized for detection and/or diagnosis of SARS-CoV-2 by FDA under an Emergency Use Authorization (EUA). This EUA will remain  in effect (meaning this test can be used) for the duration of the COVID-19 declaration under Section 564(b)(1) of the Act, 21 U.S.C.section 360bbb-3(b)(1), unless the authorization is terminated  or revoked sooner.       Influenza A by PCR NEGATIVE NEGATIVE Final   Influenza B by PCR NEGATIVE NEGATIVE Final    Comment: (NOTE) The Xpert Xpress SARS-CoV-2/FLU/RSV plus assay is intended as an aid in the diagnosis of influenza from Nasopharyngeal swab specimens and should not be used as a sole basis for treatment. Nasal washings and aspirates are unacceptable for Xpert Xpress SARS-CoV-2/FLU/RSV testing.  Fact Sheet for Patients: EntrepreneurPulse.com.au  Fact Sheet for Healthcare  Providers: IncredibleEmployment.be  This test is not yet approved or cleared by the [place] FDA and has been authorized for detection and/or diagnosis of SARS-CoV-2 by FDA under an Emergency Use Authorization (EUA). This EUA will remain in effect (meaning this test can be used) for the duration of the COVID-19 declaration under Section 564(b)(1) of the Act, 21 U.S.C. section 360bbb-3(b)(1), unless the authorization is terminated or revoked.  Performed at [facility], [place], [place] [address]   Respiratory (~20 pathogens) panel by PCR     Status: Abnormal   Collection Time: 05/29/21 12:37 PM   Specimen: Nasopharyngeal Swab; Respiratory  Result Value Ref Range Status   Adenovirus NOT DETECTED NOT DETECTED Final   Coronavirus 229E NOT DETECTED NOT DETECTED Final    Comment: (NOTE) The Coronavirus on the Respiratory Panel, DOES NOT test for the novel  Coronavirus (2019 nCoV)    Coronavirus HKU1 NOT DETECTED NOT DETECTED Final   Coronavirus NL63 NOT DETECTED NOT DETECTED Final   Coronavirus OC43 NOT DETECTED NOT DETECTED Final   Metapneumovirus NOT DETECTED NOT DETECTED Final   Rhinovirus / Enterovirus DETECTED (A) NOT DETECTED Final   Influenza A NOT DETECTED NOT DETECTED Final   Influenza B NOT DETECTED NOT DETECTED Final   Parainfluenza Virus 1 NOT DETECTED NOT DETECTED Final   Parainfluenza Virus 2 NOT DETECTED NOT DETECTED Final   Parainfluenza Virus 3 NOT DETECTED NOT DETECTED Final   Parainfluenza Virus 4 NOT DETECTED NOT DETECTED Final   Respiratory Syncytial Virus NOT DETECTED NOT DETECTED Final   Bordetella pertussis NOT DETECTED NOT DETECTED Final   Bordetella Parapertussis NOT DETECTED NOT DETECTED Final   Chlamydophila pneumoniae  NOT DETECTED NOT DETECTED Final   Mycoplasma pneumoniae NOT DETECTED NOT DETECTED Final    Comment: Performed at [facility], [place] [address] [place], [place] [address]     Radiology Studies: No results found.     Robby Bulkley T. [place]  If 7PM-7AM, please contact night-coverage [email or web address] 05/31/2021, 2:51 PM

## 2021-05-31 NOTE — TOC Progression Note (Signed)
Transition of Care [facility]) - Progression Note    Patient Details  Name: Jeremiah [name] MRN: [id number] Date of Birth: 13-Apr-1951  [facility] CM/SW Contact  Eileen Stanford, LCSW Phone Number: 05/31/2021, 3:59 PM  Clinical Narrative:  CSW spoke with pt regarding SNF recommendation. Pt has been to [facility] in the past. Pt request that he have time to think about it and ask that CSW follow up with him tomorrow. Pt would be agreeable to CSW sending referral. Pt is also requesting a earlier dc then evening. Pt does not want to dc to snf later in the day. MD notified.     Expected Discharge Plan: [place] Barriers to Discharge: Continued Medical Work up  Expected Discharge Plan and Services Expected Discharge Plan: [place]: PT, OT [facility] Agency: [place] Date [place]: 05/30/21 Time [place]: 62 Representative spoke with at [place]: [place] ([place]) Interventions    Readmission Risk Interventions Readmission Risk Prevention Plan 10/21/2020 09/27/2020 06/17/2020  Transportation Screening Complete Complete Complete  PCP or Specialist Appt within 3-5 Days Complete Complete Complete  HRI or Home Care Consult Complete Complete Complete  Social Work Consult for [place] Planning/Counseling Complete Not Complete Complete  SW consult not completed comments - RNCM assigned to patient -  Palliative Care Screening Not Applicable Not Applicable Not Applicable  Medication Review (RN Care Manager) Complete Complete Complete  Some recent data might be hidden

## 2021-05-31 NOTE — NC FL2 (Signed)
[place] LEVEL OF CARE SCREENING TOOL     IDENTIFICATION  Patient Name: Jeremiah [name] Birthdate: 06-Aug-1950 Sex: male Admission Date (Current Location): 05/28/2021  [facility] and [place] Number:  Engineering geologist and Address:  [facility], [address] [place], [place] [address]      Provider Number: [id number]  Attending Physician Name and Address:  Mercy Riding, MD  Relative Name and Phone Number:       Current Level of Care: Hospital Recommended Level of Care: [place] Prior Approval Number:    Date Approved/Denied:   PASRR Number: [id number]  Discharge Plan: SNF    Current Diagnoses: Patient Active Problem List   Diagnosis Date Noted   Malnutrition of moderate degree 05/31/2021   GERD (gastroesophageal reflux disease) 05/28/2021   Acute respiratory failure with hypoxia ([place]) 05/28/2021   Anemia 10/18/2020   Acute and chronic respiratory failure (acute-on-chronic) ([place]) 10/18/2020   Multifocal pneumonia 06/16/2020   Cellulitis 05/15/2020   COPD, moderate ([place]) 05/14/2020   Nicotine dependence 05/14/2020   Cellulitis of right foot 05/14/2020   Hyponatremia 05/14/2020   Sepsis ([place]) 05/14/2020   HTN (hypertension) 05/14/2020   Abnormal LFTs 05/14/2020   Acute on chronic respiratory failure with hypoxia ([place]) 10/15/2016   Acute bronchitis 10/15/2016   Tobacco dependency 10/15/2016   Other emphysema ([place]) 08/22/2016    Orientation RESPIRATION BLADDER Height & Weight     Self, Situation, Time, Place  O2 ([place] 4L) Incontinent Weight: 156 lb 12 oz (71.1 kg) Height:  6' (182.9 cm)  BEHAVIORAL SYMPTOMS/MOOD NEUROLOGICAL BOWEL NUTRITION STATUS      Incontinent Diet (healthy/carb modified, Thin; Fluid restriction: 1200 mL Fluid)  AMBULATORY STATUS COMMUNICATION OF NEEDS Skin   Extensive Assist Verbally Normal                       Personal Care Assistance Level of Assistance  Bathing,  Feeding, Dressing Bathing Assistance: Maximum assistance Feeding assistance: Independent Dressing Assistance: Maximum assistance     Functional Limitations Info  Sight, Hearing, Speech Sight Info: Adequate Hearing Info: Adequate Speech Info: Adequate    SPECIAL CARE FACTORS FREQUENCY  PT (By licensed PT), OT (By licensed OT)     PT Frequency: 5x OT Frequency: 5x            Contractures Contractures Info: Not present    Additional Factors Info  Code Status, Allergies Code Status Info: full code Allergies Info: penicillins           Current Medications (05/31/2021):  This is the current hospital active medication list Current Facility-Administered Medications  Medication Dose Route Frequency Provider Last Rate Last Admin   acetaminophen (TYLENOL) tablet 650 mg  650 mg Oral Q6H PRN Para Skeans, MD   650 mg at 05/30/21 1227   Or   acetaminophen (TYLENOL) suppository 650 mg  650 mg Rectal Q6H PRN Para Skeans, MD       albuterol (PROVENTIL) (2.5 MG/3ML) 0.083% nebulizer solution 2.5 mg  2.5 mg Nebulization Q2H PRN Para Skeans, MD       atorvastatin (LIPITOR) tablet 40 mg  40 mg Oral QPM Florina Ou V, MD   40 mg at 05/30/21 1759   azithromycin (ZITHROMAX) tablet 500 mg  500 mg Oral Daily Benita Gutter, RPH   500 mg at 05/30/21 1759   diltiazem (CARDIZEM) tablet 120 mg  120 mg Oral BID Para Skeans, MD  120 mg at 05/31/21 0843   feeding supplement (ENSURE ENLIVE / ENSURE PLUS) liquid 237 mL  237 mL Oral TID BM Wendee Beavers T, MD   237 mL at 05/30/21 2126   guaiFENesin (MUCINEX) 12 hr tablet 600 mg  600 mg Oral BID PRN Para Skeans, MD   600 mg at 05/29/21 2125   heparin injection 5,000 Units  5,000 Units Subcutaneous Q8H Para Skeans, MD   5,000 Units at 05/31/21 1253   hydrALAZINE (APRESOLINE) injection 5 mg  5 mg Intravenous Q4H PRN Para Skeans, MD       ipratropium-albuterol (DUONEB) 0.5-2.5 (3) MG/3ML nebulizer solution 3 mL  3 mL Nebulization Q6H Florina Ou  V, MD   3 mL at 05/31/21 1424   ipratropium-albuterol (DUONEB) 0.5-2.5 (3) MG/3ML nebulizer solution 3 mL  3 mL Nebulization Q4H PRN Sharion Settler, NP       methylPREDNISolone sodium succinate (SOLU-MEDROL) 125 mg/2 mL injection 60 mg  60 mg Intravenous Q12H Gonfa, Taye T, MD       montelukast (SINGULAIR) tablet 10 mg  10 mg Oral QHS Florina Ou V, MD   10 mg at 05/30/21 2128   multivitamin with minerals tablet 1 tablet  1 tablet Oral Daily Wendee Beavers T, MD   1 tablet at 05/31/21 0843   nicotine (NICODERM CQ - dosed in mg/24 hours) patch 21 mg  21 mg Transdermal Daily Florina Ou V, MD   21 mg at 05/31/21 0852   pantoprazole (PROTONIX) EC tablet 20 mg  20 mg Oral Daily Florina Ou V, MD   20 mg at 05/31/21 0843   [START ON 06/01/2021] predniSONE (DELTASONE) tablet 40 mg  40 mg Oral Q breakfast Gonfa, Taye T, MD       sodium chloride flush (NS) 0.9 % injection 3 mL  3 mL Intravenous Q12H Florina Ou V, MD   3 mL at 05/31/21 1252   sodium chloride tablet 1 g  1 g Oral TID WC Wendee Beavers T, MD   1 g at 05/31/21 0843   SUMAtriptan (IMITREX) tablet 25 mg  25 mg Oral Q2H PRN Wendee Beavers T, MD   25 mg at 05/31/21 1218   thiamine tablet 100 mg  100 mg Oral Daily Para Skeans, MD   100 mg at 05/31/21 9417     Discharge Medications: Please see discharge summary for a list of discharge medications.  Relevant Imaging Results:  Relevant Lab Results:   Additional Information ssn:[id number]  Gerrianne Scale Patricio Popwell, LCSW

## 2021-06-01 LAB — CBC
HCT: 34.1 % — ABNORMAL LOW (ref 39.0–52.0)
Hemoglobin: 10.8 g/dL — ABNORMAL LOW (ref 13.0–17.0)
MCH: 27.1 pg (ref 26.0–34.0)
MCHC: 31.7 g/dL (ref 30.0–36.0)
MCV: 85.5 fL (ref 80.0–100.0)
Platelets: 370 10*3/uL (ref 150–400)
RBC: 3.99 MIL/uL — ABNORMAL LOW (ref 4.22–5.81)
RDW: 16.8 % — ABNORMAL HIGH (ref 11.5–15.5)
WBC: 10.8 10*3/uL — ABNORMAL HIGH (ref 4.0–10.5)
nRBC: 0 % (ref 0.0–0.2)

## 2021-06-01 LAB — RENAL FUNCTION PANEL
Albumin: 2.9 g/dL — ABNORMAL LOW (ref 3.5–5.0)
Anion gap: 4 — ABNORMAL LOW (ref 5–15)
BUN: 21 mg/dL (ref 8–23)
CO2: 37 mmol/L — ABNORMAL HIGH (ref 22–32)
Calcium: 8.5 mg/dL — ABNORMAL LOW (ref 8.9–10.3)
Chloride: 85 mmol/L — ABNORMAL LOW (ref 98–111)
Creatinine, Ser: 0.54 mg/dL — ABNORMAL LOW (ref 0.61–1.24)
GFR, Estimated: >60 mL/min (ref >60)
Glucose, Bld: 150 mg/dL — ABNORMAL HIGH (ref 70–99)
Phosphorus: 2.6 mg/dL (ref 2.5–4.6)
Potassium: 5.1 mmol/L (ref 3.5–5.1)
Sodium: 126 mmol/L — ABNORMAL LOW (ref 135–145)

## 2021-06-01 LAB — RESP PANEL BY RT-PCR (FLU A&B, COVID) ARPGX2
Influenza A by PCR: NEGATIVE
Influenza B by PCR: NEGATIVE
SARS Coronavirus 2 by RT PCR: NEGATIVE

## 2021-06-01 LAB — MAGNESIUM: Magnesium: 2 mg/dL (ref 1.7–2.4)

## 2021-06-01 NOTE — Progress Notes (Signed)
Physical Therapy Treatment Patient Details Name: Jeremiah [name] MRN: [id number] DOB: [birth year] Today's Date: 06/01/2021   History of Present Illness Jeremiah [name] is a 71 y.o. male who has a history of COPD and pneumonia.  He is smoking but has cut back.  He complains of several days of increasing shortness of breath with thick yellowish phlegm which is producing.  There is possibly a little bit more phlegm than usual.  EMS reports that he was running 82% on room air and went up to 88 and 90% on 4 L.  He became much more awake on 4 L.  He could not remember what had happened before that.    PT Comments    Pt received sitting in recliner; asks if getting to the commode counts as therapy. PT set up [facility] and assisted pt with transfer. Pt asked to remain standing stating it felt good to stretch out. SpO2 was monitored throughout - pt remained 90% or greater while on 4L O2. PT did cue for PLB to ensure sats did not drop below 90. Pt ended session on [facility]; all needs in reach including call bell. Pt instructed to remain seated until a nurse was present to assist with transfer back to chair. RN entered room as PT was leaving - RN and CNA aware pt is on commode. Would benefit from skilled PT to address above deficits and promote optimal return to PLOF.   Recommendations for follow up therapy are one component of a multi-disciplinary discharge planning process, led by the attending physician.  Recommendations may be updated based on patient status, additional functional criteria and insurance authorization.  Follow Up Recommendations  Skilled nursing-short term rehab (<3 hours/day)     Assistance Recommended at Discharge Intermittent Supervision/Assistance  Patient can return home with the following Assistance with cooking/housework;Help with stairs or ramp for entrance;Assist for transportation;A lot of help with bathing/dressing/bathroom;A lot of help with walking and/or transfers   Equipment  Recommendations  None recommended by PT    Recommendations for Other Services       Precautions / Restrictions Precautions Precautions: Fall Restrictions Weight Bearing Restrictions: No     Mobility  Bed Mobility               General bed mobility comments: recieved in recliner, left on BSC (RN and CNA aware)    Transfers Overall transfer level: Needs assistance Equipment used: Rolling walker (2 wheels) Transfers: Sit to/from Stand, Bed to chair/wheelchair/BSC Sit to Stand: Min guard Stand pivot transfers: Min guard         General transfer comment: CGA for light steadying; remained standing (static) with SUP for >2 minutes    Ambulation/Gait               General Gait Details: deferred   Stairs             Wheelchair Mobility    Modified Rankin (Stroke Patients Only)       Balance Overall balance assessment: Needs assistance Sitting-balance support: No upper extremity supported, Feet supported Sitting balance-Leahy Scale: Good Sitting balance - Comments: in recliner and on BSC   Standing balance support: Bilateral upper extremity supported, During functional activity Standing balance-Leahy Scale: Fair Standing balance comment: Using RW, CGA progressing to SUP to transfer                            Cognition Arousal/Alertness: Awake/alert Behavior During Therapy: [facility] for  tasks assessed/performed Overall Cognitive Status: No family/caregiver present to determine baseline cognitive functioning                                 General Comments: pleasant and agreeable to transfer to commode        Exercises      General Comments General comments (skin integrity, edema, etc.): While on 4L O2, SpO2 90-93% throughout session. VC on PLB.      Pertinent Vitals/Pain Pain Assessment Pain Assessment: Faces Faces Pain Scale: No hurt    Home Living                          Prior Function             PT Goals (current goals can now be found in the care plan section) Acute Rehab PT Goals Patient Stated Goal: get better, go home PT Goal Formulation: With patient Time For Goal Achievement: 06/12/21 Potential to Achieve Goals: Fair    Frequency    Min 2X/week      PT Plan      Co-evaluation              AM-PAC PT "6 Clicks" Mobility   Outcome Measure  Help needed turning from your back to your side while in a flat bed without using bedrails?: A Little Help needed moving from lying on your back to sitting on the side of a flat bed without using bedrails?: A Little Help needed moving to and from a bed to a chair (including a wheelchair)?: A Little Help needed standing up from a chair using your arms (e.g., wheelchair or bedside chair)?: A Little Help needed to walk in hospital room?: A Little Help needed climbing 3-5 steps with a railing? : A Lot 6 Click Score: 17    End of Session Equipment Utilized During Treatment: Oxygen Activity Tolerance: Patient tolerated treatment well Patient left: Other (comment);with nursing/sitter in room;with call bell/phone within reach (on [facility]) Nurse Communication: Mobility status;Other (comment) (pt on BSC - please check frequently) PT Visit Diagnosis: Unsteadiness on feet (R26.81);Muscle weakness (generalized) (M62.81)     Time: 4332-9518 PT Time Calculation (min) (ACUTE ONLY): 20 min  Charges:  $Therapeutic Activity: 8-22 mins                     Patrina Levering PT, DPT 06/01/21 4:20 PM [phone]

## 2021-06-01 NOTE — TOC Progression Note (Signed)
Transition of Care [facility]) - Progression Note    Patient Details  Name: Jeremiah [name] MRN: [id number] Date of Birth: 09/15/1950  Transition of Care [facility]) CM/SW Contact  Eileen Stanford, LCSW Phone Number: 06/01/2021, 12:11 PM  Clinical Narrative:  Spoke with pt and he is agreeable to [facility]. Anticipated dc is tomorrow. CSW to start auth.     Expected Discharge Plan: [place] Barriers to Discharge: Continued Medical Work up  Expected Discharge Plan and Services Expected Discharge Plan: [place]: PT, OT [facility] Agency: [place] Date [place]: 05/30/21 Time [place]: 85 Representative spoke with at [place]: [place] ([place]) Interventions    Readmission Risk Interventions Readmission Risk Prevention Plan 10/21/2020 09/27/2020 06/17/2020  Transportation Screening Complete Complete Complete  PCP or Specialist Appt within 3-5 Days Complete Complete Complete  HRI or Home Care Consult Complete Complete Complete  Social Work Consult for [place] Planning/Counseling Complete Not Complete Complete  SW consult not completed comments - RNCM assigned to patient -  Palliative Care Screening Not Applicable Not Applicable Not Applicable  Medication Review (RN Care Manager) Complete Complete Complete  Some recent data might be hidden

## 2021-06-01 NOTE — Progress Notes (Signed)
Occupational Therapy Treatment Patient Details Name: Jeremiah [name] MRN: [id number] DOB: Sep 07, 1950 Today's Date: 06/01/2021   History of present illness Jeremiah [name] is a 71 y.o. male who has a history of COPD and pneumonia.  He is smoking but has cut back.  He complains of several days of increasing shortness of breath with thick yellowish phlegm which is producing.  There is possibly a little bit more phlegm than usual.  EMS reports that he was running 82% on room air and went up to 88 and 90% on 4 L.  He became much more awake on 4 L.  He could not remember what had happened before that.   OT comments  Pt seen for OT treatment on this date. Upon arrival to room, pt awake and seated upright in bed. Pt agreeable to OT tx, but required significant encouragement to attempt functional tasks before requesting assistance from this author. Following bed mobility, to which pt performed MOD-I, pt was observed to be soiled in urine d/t condom catheter malfunction. Pt required MAX A for donning socks, MOD A for seated UB dressing, MIN A for step pivot transfer to [facility], and SET-UP assist to clean thighs with washcloth. Pt left seated upright in recliner, with all needs within reach. During session, SpO2 90-96% while on 4L/min of O2. Pt continues to presents with decreased activity tolerance, strength, and balance and would benefit from skilled OT services at STR to maximize return to PLOF and minimize risk of future falls, injury, caregiver burden, and readmission. Will continue to follow POC.   Recommendations for follow up therapy are one component of a multi-disciplinary discharge planning process, led by the attending physician.  Recommendations may be updated based on patient status, additional functional criteria and insurance authorization.    Follow Up Recommendations  Skilled nursing-short term rehab (<3 hours/day)    Assistance Recommended at Discharge Frequent or constant  Supervision/Assistance  Patient can return home with the following  A little help with walking and/or transfers;A lot of help with bathing/dressing/bathroom   Equipment Recommendations  Other (comment) (defer to next venue of care)       Precautions / Restrictions Precautions Precautions: Fall Restrictions Weight Bearing Restrictions: No       Mobility Bed Mobility Overal bed mobility: Modified Independent Bed Mobility: Supine to Sit     Supine to sit: Modified independent (Device/Increase time)          Transfers Overall transfer level: Needs assistance Equipment used: Rolling walker (2 wheels) Transfers: Sit to/from Stand, Bed to chair/wheelchair/BSC Sit to Stand: Min guard Stand pivot transfers: Min assist         General transfer comment: Requires MIN GUARD for sit>stand, but MIN A to step pivot to [facility]     Balance Overall balance assessment: Needs assistance Sitting-balance support: No upper extremity supported, Feet supported Sitting balance-Leahy Scale: Good Sitting balance - Comments: Good sitting balance reaching within BOS   Standing balance support: Bilateral upper extremity supported, During functional activity Standing balance-Leahy Scale: Fair Standing balance comment: Requires MIN GUARD for functional mobility of short household distances                           ADL either performed or assessed with clinical judgement   ADL Overall ADL's : Needs assistance/impaired     Grooming: Wash/dry hands;Set up;Sitting       Lower Body Bathing: Set up;Sitting/lateral leans Lower Body Bathing Details (indicate  cue type and reason): to clean thighs while seated in recliner Upper Body Dressing : Moderate assistance;Sitting Upper Body Dressing Details (indicate cue type and reason): MOD A to don/doff hospital gown Lower Body Dressing: Maximal assistance;Bed level Lower Body Dressing Details (indicate cue type and reason): to don/doff  socks Toilet Transfer: Minimal assistance;Stand-pivot;BSC/3in1   Toileting- Clothing Manipulation and Hygiene: Supervision/safety;Set up;Sitting/lateral lean       Functional mobility during ADLs: Min guard;Rolling walker (2 wheels)        Cognition Arousal/Alertness: Awake/alert Behavior During Therapy: Anxious Overall Cognitive Status: No family/caregiver present to determine baseline cognitive functioning                                 General Comments: Pt agreeable to OT tx, but requires significant encouragement to attempt functional tasks before receiving assistance. Demonstrates some decreased short term memory; unable to recall that multiple members of the medical team have seen him this AM. Some anxiety regarding SpO2 levels              General Comments While on 4L, SpO2 90-96% throughout session    Pertinent Vitals/ Pain       Pain Assessment Pain Assessment: Faces Faces Pain Scale: Hurts even more Pain Location: head (migrane) Pain Descriptors / Indicators: Aching Pain Intervention(s): Monitored during session         Frequency  Min 2X/week        Progress Toward Goals  OT Goals(current goals can now be found in the care plan section)  Progress towards OT goals: Progressing toward goals  Acute Rehab OT Goals Patient Stated Goal: to go home OT Goal Formulation: With patient Time For Goal Achievement: 06/13/21 Potential to Achieve Goals: Good  Plan Discharge plan remains appropriate;Frequency remains appropriate       AM-PAC OT "6 Clicks" Daily Activity     Outcome Measure   Help from another person eating meals?: None Help from another person taking care of personal grooming?: A Little Help from another person toileting, which includes using toliet, bedpan, or urinal?: A Lot Help from another person bathing (including washing, rinsing, drying)?: A Lot Help from another person to put on and taking off regular upper body  clothing?: A Lot Help from another person to put on and taking off regular lower body clothing?: A Lot 6 Click Score: 15    End of Session Equipment Utilized During Treatment: Rolling walker (2 wheels);Oxygen  OT Visit Diagnosis: Unsteadiness on feet (R26.81);Muscle weakness (generalized) (M62.81)   Activity Tolerance Patient tolerated treatment well   Patient Left in chair;with call bell/phone within reach;with chair alarm set   Nurse Communication Mobility status        Time: 9532-0233 OT Time Calculation (min): 34 min  Charges: OT General Charges $OT Visit: 1 Visit OT Treatments $Self Care/Home Management : 23-37 mins  Fredirick Maudlin, [place]

## 2021-06-01 NOTE — Progress Notes (Signed)
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  Reportedly saturating at 82% on RA when EMS arrived but recovered to 90% on 4 L.  In ED, he desaturated to 84% on 4 L by [place].  VBG with compensated respiratory acidosis.  BNP 101.  Serial troponin negative.  WBC 14.  CTA chest negative for PE but severe emphysema, PAH, multifocal symmetric peripheral pulmonary infiltrates concerning for atypical infection/inflammation, progressive, pathologic right hilar adenopathy and stable 12 mm RLL pulmonary nodule patient was started on BiPAP, IV Solu-Medrol and nebulizers.   RVP positive for rhinovirus.    Assessment & Plan:   Principal Problem:   Acute and chronic respiratory failure (acute-on-chronic) (HCC) Active Problems:   Tobacco dependency   COPD, moderate (HCC)   Hyponatremia   HTN (hypertension)   Anemia   GERD (gastroesophageal reflux disease)   Acute respiratory failure with hypoxia (HCC)   Malnutrition of moderate degree  Acute on chronic respiratory failure with hypoxia and hypercapnia due to COPD exacerbation in the setting of viral URI and ongoing tobacco use: CTA negative for PE. RVP positive for rhinovirus. -Wean oxygen-minimum oxygen to keep saturation above 88%  -BiPAP as needed -DC IV Solu-Medrol -P.o. prednisone, 40 mg a day x5 days -Completed course of azithromycin -Encouraged tobacco cessation. -IS/OOB/PT/OT -Patient agreeable to skilled nursing facility -Plan for [facility] 1/[facility]   Hyponatremia: Somewhat chronic.  Baseline in upper 120s.  Urine sodium <10 arguing against SIADH but hyponatremia  improved with fluid restriction and p.o. sodium chloride.  Hyperaldosteronism? -Continue p.o. sodium chloride -Monitor sodium -Outpatient PCP versus nephrology follow-up   Hyperkalemia: Resolved   Essential hypertension: Normotensive -Continue home Cardizem.   Tobacco use disorder: Reports smoking about half a pack a day -Encouraged cessation. -Nicotine patch   Pulmonary nodule: CTA showed stable 12 mm RLL nodule -Repeat CT in 12 months.   Physical deconditioning-patient lives with friends.  Gets around holding to stuff in the house.  Has no walker. -PT/OT recommended SNF.  Patient now agreeable.  Plan discharge to [facility] 1/26   Leukocytosis/bandemia: Likely demargination from steroid. -Monitor   Migraine headache -Imitrex as needed -Tylenol as needed  Moderate protein calorie malnutrition -RD consultation   DVT prophylaxis: SQ heparin Code Status: Full Family Communication: None Disposition Plan: Status is: Inpatient  Remains inpatient appropriate because: Improving respiratory status.  Anticipate medical readiness for discharge 1/26      Level of care: Progressive  Consultants:  None  Procedures:  None  Antimicrobials: None   Subjective: Seen and examined.  Resting in bed.  No visible distress.  No pain complaints  Objective: Vitals:   06/01/21 0931 06/01/21 0939 06/01/21 1059 06/01/21 1146  BP: 131/66 131/66  127/68  Pulse: 83   86  Resp:      Temp:    98 F (36.7 C)  TempSrc:    Oral  SpO2:   96% 96%  Weight:      Height:        Intake/Output Summary (Last 24 hours) at 06/01/2021 1242 Last data filed at 06/01/2021 0900 Gross per 24 hour  Intake 1080 ml  Output 975  ml  Net 105 ml   Filed Weights   05/31/21 0500  Weight: 71.1 kg    Examination:  General exam: No acute distress.  Appears frail and chronically ill Respiratory system: Diminished breath sounds bilaterally.  Normal work of breathing.  4 L Cardiovascular system:  S1-S2, RRR, no murmurs, no pedal edema Gastrointestinal system: Soft, NT/ND, normal bowel sounds Central nervous system: Alert and oriented. No focal neurological deficits. Extremities: Symmetric 5 x 5 power. Skin: No rashes, lesions or ulcers Psychiatry: Judgement and insight appear normal. Mood & affect appropriate.     Data Reviewed: I have personally reviewed following labs and imaging studies  CBC: Recent Labs  Lab 05/28/21 1642 05/28/21 2023 05/29/21 0448 05/30/21 0436 05/31/21 0634 06/01/21 0504  WBC 19.3* 17.6* 13.8* 16.5* 11.5* 10.8*  NEUTROABS 17.6*  --   --   --   --   --   HGB 11.5* 11.3* 10.5* 10.9* 11.2* 10.8*  HCT 35.9* 34.4* 32.4* 33.4* 34.7* 34.1*  MCV 86.1 85.1 84.6 84.8 86.5 85.5  PLT 369 367 340 356 378 220   Basic Metabolic Panel: Recent Labs  Lab 05/28/21 1642 05/28/21 2023 05/29/21 0448 05/29/21 0725 05/30/21 0436 05/30/21 0654 05/30/21 1448 05/30/21 2253 05/31/21 0634 06/01/21 0504  NA 124*  --  120*   < > 122* 122* 120* 125* 128* 126*  K 4.8  --  4.5  --  5.7*  --   --   --  5.3* 5.1  CL 83*  --  83*  --  86*  --   --   --  86* 85*  CO2 31  --  30  --  32  --   --   --  37* 37*  GLUCOSE 81  --  197*  --  169*  --   --   --  136* 150*  BUN 14  --  15  --  22  --   --   --  20 21  CREATININE 0.40* 0.50* 0.51*  --  0.50*  --   --   --  0.47* 0.54*  CALCIUM 8.9  --  8.6*  --  8.5*  --   --   --  8.7* 8.5*  MG  --   --   --   --  2.1  --   --   --  2.1 2.0  PHOS  --   --   --   --  3.0  --   --   --  2.5 2.6   < > = values in this interval not displayed.   GFR: Estimated Creatinine Clearance: 86.4 mL/min (A) (by C-G formula based on SCr of 0.54 mg/dL (L)). Liver Function Tests: Recent Labs  Lab 05/28/21 1642 05/30/21 0436 05/31/21 0634 06/01/21 0504  AST 27  --   --   --   ALT 18  --   --   --   ALKPHOS 219*  --   --   --   BILITOT 0.7  --   --   --   PROT 8.2*  --   --   --   ALBUMIN 3.6 2.9* 2.9* 2.9*   No results for input(s):  LIPASE, AMYLASE in the last 168 hours. No results for input(s): AMMONIA in the last 168 hours. Coagulation Profile: No results for input(s): INR, PROTIME in the last 168 hours. Cardiac Enzymes: No results for input(s): CKTOTAL, CKMB, CKMBINDEX, TROPONINI in the last 168 hours.  BNP (last 3 results) No results for input(s): PROBNP in the last 8760 hours. HbA1C: No results for input(s): HGBA1C in the last 72 hours. CBG: Recent Labs  Lab 05/31/21 2148  GLUCAP 150*   Lipid Profile: No results for input(s): CHOL, HDL, LDLCALC, TRIG, CHOLHDL, LDLDIRECT in the last 72 hours. Thyroid Function Tests: Recent Labs    05/30/21 0436  TSH 0.416   Anemia Panel: No results for input(s): VITAMINB12, FOLATE, FERRITIN, TIBC, IRON, RETICCTPCT in the last 72 hours. Sepsis Labs: Recent Labs  Lab 05/28/21 2023 05/29/21 0725 05/30/21 0436 05/31/21 0634  PROCALCITON <0.10 <0.10 <0.10 <0.10    Recent Results (from the past 240 hour(s))  Resp Panel by RT-PCR (Flu A&B, Covid) Nasopharyngeal Swab     Status: None   Collection Time: 05/28/21  5:06 PM   Specimen: Nasopharyngeal Swab; Nasopharyngeal(NP) swabs in vial transport medium  Result Value Ref Range Status   SARS Coronavirus 2 by RT PCR NEGATIVE NEGATIVE Final    Comment: (NOTE) SARS-CoV-2 target nucleic acids are NOT DETECTED.  The SARS-CoV-2 RNA is generally detectable in upper respiratory specimens during the acute phase of infection. The lowest concentration of SARS-CoV-2 viral copies this assay can detect is 138 copies/mL. A negative result does not preclude SARS-Cov-2 infection and should not be used as the sole basis for treatment or other patient management decisions. A negative result may occur with  improper specimen collection/handling, submission of specimen other than nasopharyngeal swab, presence of viral mutation(s) within the areas targeted by this assay, and inadequate number of viral copies(<138 copies/mL). A negative  result must be combined with clinical observations, patient history, and epidemiological information. The expected result is Negative.  Fact Sheet for Patients:  EntrepreneurPulse.com.au  Fact Sheet for Healthcare Providers:  IncredibleEmployment.be  This test is no t yet approved or cleared by the [place] FDA and  has been authorized for detection and/or diagnosis of SARS-CoV-2 by FDA under an Emergency Use Authorization (EUA). This EUA will remain  in effect (meaning this test can be used) for the duration of the COVID-19 declaration under Section 564(b)(1) of the Act, 21 U.S.C.section 360bbb-3(b)(1), unless the authorization is terminated  or revoked sooner.       Influenza A by PCR NEGATIVE NEGATIVE Final   Influenza B by PCR NEGATIVE NEGATIVE Final    Comment: (NOTE) The Xpert Xpress SARS-CoV-2/FLU/RSV plus assay is intended as an aid in the diagnosis of influenza from Nasopharyngeal swab specimens and should not be used as a sole basis for treatment. Nasal washings and aspirates are unacceptable for Xpert Xpress SARS-CoV-2/FLU/RSV testing.  Fact Sheet for Patients: EntrepreneurPulse.com.au  Fact Sheet for Healthcare Providers: IncredibleEmployment.be  This test is not yet approved or cleared by the [place] FDA and has been authorized for detection and/or diagnosis of SARS-CoV-2 by FDA under an Emergency Use Authorization (EUA). This EUA will remain in effect (meaning this test can be used) for the duration of the COVID-19 declaration under Section 564(b)(1) of the Act, 21 U.S.C. section 360bbb-3(b)(1), unless the authorization is terminated or revoked.  Performed at [facility], [place], [place] [address]   Respiratory (~20 pathogens) panel by PCR     Status: Abnormal   Collection Time: 05/29/21 12:37 PM   Specimen: Nasopharyngeal Swab; Respiratory   Result Value Ref Range Status   Adenovirus NOT DETECTED NOT DETECTED Final   Coronavirus 229E NOT DETECTED NOT DETECTED Final    Comment: (NOTE) The Coronavirus on the  Respiratory Panel, DOES NOT test for the novel  Coronavirus (2019 nCoV)    Coronavirus HKU1 NOT DETECTED NOT DETECTED Final   Coronavirus NL63 NOT DETECTED NOT DETECTED Final   Coronavirus OC43 NOT DETECTED NOT DETECTED Final   Metapneumovirus NOT DETECTED NOT DETECTED Final   Rhinovirus / Enterovirus DETECTED (A) NOT DETECTED Final   Influenza A NOT DETECTED NOT DETECTED Final   Influenza B NOT DETECTED NOT DETECTED Final   Parainfluenza Virus 1 NOT DETECTED NOT DETECTED Final   Parainfluenza Virus 2 NOT DETECTED NOT DETECTED Final   Parainfluenza Virus 3 NOT DETECTED NOT DETECTED Final   Parainfluenza Virus 4 NOT DETECTED NOT DETECTED Final   Respiratory Syncytial Virus NOT DETECTED NOT DETECTED Final   Bordetella pertussis NOT DETECTED NOT DETECTED Final   Bordetella Parapertussis NOT DETECTED NOT DETECTED Final   Chlamydophila pneumoniae NOT DETECTED NOT DETECTED Final   Mycoplasma pneumoniae NOT DETECTED NOT DETECTED Final    Comment: Performed at [facility], [place] [address], [place], [place] [address]         Radiology Studies: No results found.      Scheduled Meds:  atorvastatin  40 mg Oral QPM   diltiazem  120 mg Oral BID   feeding supplement  237 mL Oral TID BM   heparin  5,000 Units Subcutaneous Q8H   ipratropium-albuterol  3 mL Nebulization TID   montelukast  10 mg Oral QHS   multivitamin with minerals  1 tablet Oral Daily   nicotine  21 mg Transdermal Daily   pantoprazole  20 mg Oral Daily   predniSONE  40 mg Oral Q breakfast   sodium chloride flush  3 mL Intravenous Q12H   sodium chloride  1 g Oral TID WC   thiamine  100 mg Oral Daily   Continuous Infusions:   LOS: 4 days    Time spent: 35 minutes    Sidney Ace, MD [facility]   If 7PM-7AM, please  contact night-coverage  06/01/2021, 12:42 PM

## 2021-06-02 LAB — URINALYSIS, COMPLETE (UACMP) WITH MICROSCOPIC
Bacteria, UA: NONE SEEN
Bilirubin Urine: NEGATIVE
Glucose, UA: NEGATIVE mg/dL
Hgb urine dipstick: NEGATIVE
Ketones, ur: NEGATIVE mg/dL
Leukocytes,Ua: NEGATIVE
Nitrite: NEGATIVE
Protein, ur: NEGATIVE mg/dL
Specific Gravity, Urine: 1.015 (ref 1.005–1.030)
pH: 6.5 (ref 5.0–8.0)

## 2021-06-02 NOTE — TOC Progression Note (Signed)
Transition of Care [facility]) - Progression Note    Patient Details  Name: Jeremiah [name] MRN: [id number] Date of Birth: 05/16/1950  [facility]) CM/SW Contact  Eileen Stanford, LCSW Phone Number: 06/02/2021, 12:51 PM  Clinical Narrative:  Spoke with pt regarding the choice between home with [facility] and SNF. He said he needs to talk to [place] first. Pt states he can not get a hold of her. CSW called her home number and she answered. CSW asked that she call pt in his room. She was going to call him right after she hung up.     Expected Discharge Plan: [place] Barriers to Discharge: Continued Medical Work up  Expected Discharge Plan and Services Expected Discharge Plan: [place]: PT, OT [facility] Agency: [place] Date [place]: 05/30/21 Time [place]: 56 Representative spoke with at [place]: [place] ([place]) Interventions    Readmission Risk Interventions Readmission Risk Prevention Plan 10/21/2020 09/27/2020 06/17/2020  Transportation Screening Complete Complete Complete  PCP or Specialist Appt within 3-5 Days Complete Complete Complete  HRI or Home Care Consult Complete Complete Complete  Social Work Consult for [place] Planning/Counseling Complete Not Complete Complete  SW consult not completed comments - RNCM assigned to patient -  Palliative Care Screening Not Applicable Not Applicable Not Applicable  Medication Review (RN Care Manager) Complete Complete Complete  Some recent data might be hidden

## 2021-06-02 NOTE — Progress Notes (Signed)
Patient verbalized to writer desire to go home with [name], patient's friend. [name] can be reached on mobile @ [phone] or on home phone @ [phone].

## 2021-06-02 NOTE — Plan of Care (Signed)
°  Problem: Education: Goal: Knowledge of disease or condition will improve 06/02/2021 0235 by Lydia Guiles, RN Outcome: Progressing 06/02/2021 0235 by Lydia Guiles, RN Outcome: Progressing   Problem: Activity: Goal: Ability to tolerate increased activity will improve 06/02/2021 0235 by Lydia Guiles, RN Outcome: Progressing 06/02/2021 0235 by Lydia Guiles, RN Outcome: Progressing   Problem: Respiratory: Goal: Ability to maintain a clear airway will improve 06/02/2021 0235 by Lydia Guiles, RN Outcome: Progressing 06/02/2021 0235 by Lydia Guiles, RN Outcome: Progressing   Problem: Respiratory: Goal: Ability to maintain adequate ventilation will improve 06/02/2021 0235 by Lydia Guiles, RN Outcome: Progressing 06/02/2021 0235 by Lydia Guiles, RN Outcome: Progressing

## 2021-06-02 NOTE — TOC Progression Note (Signed)
Transition of Care [facility]) - Progression Note    Patient Details  Name: Jeremiah [name] MRN: [id number] Date of Birth: [birth year]  Transition of Care [facility]) CM/SW Contact  Eileen Stanford, LCSW Phone Number: 06/02/2021, 2:45 PM  Clinical Narrative:   CSW, RN and MD have all attempted to reach pt's friend [name]. CSW then called pt and explained that he would have to make the decision for disposition. Pt states he wants to go home. Pt is stating that he has to get in touch with [name] in order for her to let him in the house.    Expected Discharge Plan: [place] Barriers to Discharge: Continued Medical Work up  Expected Discharge Plan and Services Expected Discharge Plan: [place]: PT, OT [facility] Agency: [place] Date [place]: 05/30/21 Time [place]: 52 Representative spoke with at [place]: [place] ([place]) Interventions    Readmission Risk Interventions Readmission Risk Prevention Plan 10/21/2020 09/27/2020 06/17/2020  Transportation Screening Complete Complete Complete  PCP or Specialist Appt within 3-5 Days Complete Complete Complete  HRI or Home Care Consult Complete Complete Complete  Social Work Consult for [place] Planning/Counseling Complete Not Complete Complete  SW consult not completed comments - RNCM assigned to patient -  Palliative Care Screening Not Applicable Not Applicable Not Applicable  Medication Review (RN Care Manager) Complete Complete Complete  Some recent data might be hidden

## 2021-06-02 NOTE — Plan of Care (Signed)

## 2021-06-02 NOTE — Progress Notes (Signed)
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  Reportedly saturating at 82% on RA when EMS arrived but recovered to 90% on 4 L.  In ED, he desaturated to 84% on 4 L by [place].  VBG with compensated respiratory acidosis.  BNP 101.  Serial troponin negative.  WBC 14.  CTA chest negative for PE but severe emphysema, PAH, multifocal symmetric peripheral pulmonary infiltrates concerning for atypical infection/inflammation, progressive, pathologic right hilar adenopathy and stable 12 mm RLL pulmonary nodule patient was started on BiPAP, IV Solu-Medrol and nebulizers.   RVP positive for rhinovirus.    Assessment & Plan:   Principal Problem:   Acute and chronic respiratory failure (acute-on-chronic) (HCC) Active Problems:   Tobacco dependency   COPD, moderate (HCC)   Hyponatremia   HTN (hypertension)   Anemia   GERD (gastroesophageal reflux disease)   Acute respiratory failure with hypoxia (HCC)   Malnutrition of moderate degree  Acute on chronic respiratory failure with hypoxia and hypercapnia due to COPD exacerbation in the setting of viral URI and ongoing tobacco use: CTA negative for PE. RVP positive for rhinovirus. -Wean oxygen-minimum oxygen to keep saturation above 88%  -BiPAP as needed -Continue P.o. prednisone, 40 mg a day x5 days (day 2/5) -Completed course of azithromycin -Encouraged tobacco cessation. -IS/OOB/PT/OT -Patient was agreeable to skilled nursing facility -Now may elect to return home -Anticipated dc date 1/27    Hyponatremia: Somewhat chronic.  Baseline in upper 120s.  Urine sodium <10 arguing against SIADH  but hyponatremia improved with fluid restriction and p.o. sodium chloride.  Hyperaldosteronism? -Continue p.o. sodium chloride -Monitor sodium -Outpatient PCP versus nephrology follow-up   Hyperkalemia: Resolved   Essential hypertension: Normotensive -Continue home Cardizem.   Tobacco use disorder: Reports smoking about half a pack a day -Encouraged cessation. -Nicotine patch   Pulmonary nodule: CTA showed stable 12 mm RLL nodule -Repeat CT in 12 months.   Physical deconditioning-patient lives with friends.  Gets around holding to stuff in the house.  Has no walker. -PT/OT recommended SNF.  Patient now agreeable.  Plan [facility] 1/26   Leukocytosis/bandemia: Likely demargination from steroid. -Monitor   Migraine headache -Imitrex as needed -Tylenol as needed  Moderate protein calorie malnutrition -RD consultation   DVT prophylaxis: SQ heparin Code Status: Full Family Communication: None.  Multiple attempts to call friend [name], no answer, cannot leave VM Disposition Plan: Status is: Inpatient  Remains inpatient appropriate because: Improving respiratory status.  Unsafe dc plan at this time.  Anticipate dc 1/27      Level of care: Progressive  Consultants:  None  Procedures:  None  Antimicrobials: None   Subjective: Seen and examined.  Resting in bed.  No visible distress.  No pain complaints  Objective: Vitals:   06/02/21 0828 06/02/21 1153 06/02/21 1339 06/02/21 1423  BP: 118/64 124/70  118/78  Pulse: 69   97  Resp:  18  17  Temp: 98.3 F (36.8 C) 98.2 F (36.8 C)  98 F (36.7 C)  TempSrc: Oral Oral    SpO2: 97% 97% 95% 95%  Weight:      Height:  Intake/Output Summary (Last 24 hours) at 06/02/2021 1452 Last data filed at 06/02/2021 1429 Gross per 24 hour  Intake 1300 ml  Output 1725 ml  Net -425 ml   Filed Weights   05/31/21 0500  Weight: 71.1 kg    Examination:  General exam: No acute distress.  Appears  frail and chronically ill Respiratory system: Diminished breath sounds bilaterally.  Normal work of breathing.  4 L Cardiovascular system: S1-S2, RRR, no murmurs, no pedal edema Gastrointestinal system: Soft, NT/ND, normal bowel sounds Central nervous system: Alert and oriented. No focal neurological deficits. Extremities: Symmetric 5 x 5 power. Skin: No rashes, lesions or ulcers Psychiatry: Judgement and insight appear normal. Mood & affect appropriate.     Data Reviewed: I have personally reviewed following labs and imaging studies  CBC: Recent Labs  Lab 05/28/21 1642 05/28/21 2023 05/29/21 0448 05/30/21 0436 05/31/21 0634 06/01/21 0504  WBC 19.3* 17.6* 13.8* 16.5* 11.5* 10.8*  NEUTROABS 17.6*  --   --   --   --   --   HGB 11.5* 11.3* 10.5* 10.9* 11.2* 10.8*  HCT 35.9* 34.4* 32.4* 33.4* 34.7* 34.1*  MCV 86.1 85.1 84.6 84.8 86.5 85.5  PLT 369 367 340 356 378 355   Basic Metabolic Panel: Recent Labs  Lab 05/28/21 1642 05/28/21 2023 05/29/21 0448 05/29/21 0725 05/30/21 0436 05/30/21 0654 05/30/21 1448 05/30/21 2253 05/31/21 0634 06/01/21 0504  NA 124*  --  120*   < > 122* 122* 120* 125* 128* 126*  K 4.8  --  4.5  --  5.7*  --   --   --  5.3* 5.1  CL 83*  --  83*  --  86*  --   --   --  86* 85*  CO2 31  --  30  --  32  --   --   --  37* 37*  GLUCOSE 81  --  197*  --  169*  --   --   --  136* 150*  BUN 14  --  15  --  22  --   --   --  20 21  CREATININE 0.40* 0.50* 0.51*  --  0.50*  --   --   --  0.47* 0.54*  CALCIUM 8.9  --  8.6*  --  8.5*  --   --   --  8.7* 8.5*  MG  --   --   --   --  2.1  --   --   --  2.1 2.0  PHOS  --   --   --   --  3.0  --   --   --  2.5 2.6   < > = values in this interval not displayed.   GFR: Estimated Creatinine Clearance: 86.4 mL/min (A) (by C-G formula based on SCr of 0.54 mg/dL (L)). Liver Function Tests: Recent Labs  Lab 05/28/21 1642 05/30/21 0436 05/31/21 0634 06/01/21 0504  AST 27  --   --   --   ALT 18  --   --   --    ALKPHOS 219*  --   --   --   BILITOT 0.7  --   --   --   PROT 8.2*  --   --   --   ALBUMIN 3.6 2.9* 2.9* 2.9*   No results for input(s): LIPASE, AMYLASE in the last 168 hours. No results for input(s): AMMONIA in the last 168 hours. Coagulation Profile: No  results for input(s): INR, PROTIME in the last 168 hours. Cardiac Enzymes: No results for input(s): CKTOTAL, CKMB, CKMBINDEX, TROPONINI in the last 168 hours. BNP (last 3 results) No results for input(s): PROBNP in the last 8760 hours. HbA1C: No results for input(s): HGBA1C in the last 72 hours. CBG: Recent Labs  Lab 05/31/21 2148  GLUCAP 150*   Lipid Profile: No results for input(s): CHOL, HDL, LDLCALC, TRIG, CHOLHDL, LDLDIRECT in the last 72 hours. Thyroid Function Tests: No results for input(s): TSH, T4TOTAL, FREET4, T3FREE, THYROIDAB in the last 72 hours.  Anemia Panel: No results for input(s): VITAMINB12, FOLATE, FERRITIN, TIBC, IRON, RETICCTPCT in the last 72 hours. Sepsis Labs: Recent Labs  Lab 05/28/21 2023 05/29/21 0725 05/30/21 0436 05/31/21 0634  PROCALCITON <0.10 <0.10 <0.10 <0.10    Recent Results (from the past 240 hour(s))  Resp Panel by RT-PCR (Flu A&B, Covid) Nasopharyngeal Swab     Status: None   Collection Time: 05/28/21  5:06 PM   Specimen: Nasopharyngeal Swab; Nasopharyngeal(NP) swabs in vial transport medium  Result Value Ref Range Status   SARS Coronavirus 2 by RT PCR NEGATIVE NEGATIVE Final    Comment: (NOTE) SARS-CoV-2 target nucleic acids are NOT DETECTED.  The SARS-CoV-2 RNA is generally detectable in upper respiratory specimens during the acute phase of infection. The lowest concentration of SARS-CoV-2 viral copies this assay can detect is 138 copies/mL. A negative result does not preclude SARS-Cov-2 infection and should not be used as the sole basis for treatment or other patient management decisions. A negative result may occur with  improper specimen collection/handling,  submission of specimen other than nasopharyngeal swab, presence of viral mutation(s) within the areas targeted by this assay, and inadequate number of viral copies(<138 copies/mL). A negative result must be combined with clinical observations, patient history, and epidemiological information. The expected result is Negative.  Fact Sheet for Patients:  EntrepreneurPulse.com.au  Fact Sheet for Healthcare Providers:  IncredibleEmployment.be  This test is no t yet approved or cleared by the [place] FDA and  has been authorized for detection and/or diagnosis of SARS-CoV-2 by FDA under an Emergency Use Authorization (EUA). This EUA will remain  in effect (meaning this test can be used) for the duration of the COVID-19 declaration under Section 564(b)(1) of the Act, 21 U.S.C.section 360bbb-3(b)(1), unless the authorization is terminated  or revoked sooner.       Influenza A by PCR NEGATIVE NEGATIVE Final   Influenza B by PCR NEGATIVE NEGATIVE Final    Comment: (NOTE) The Xpert Xpress SARS-CoV-2/FLU/RSV plus assay is intended as an aid in the diagnosis of influenza from Nasopharyngeal swab specimens and should not be used as a sole basis for treatment. Nasal washings and aspirates are unacceptable for Xpert Xpress SARS-CoV-2/FLU/RSV testing.  Fact Sheet for Patients: EntrepreneurPulse.com.au  Fact Sheet for Healthcare Providers: IncredibleEmployment.be  This test is not yet approved or cleared by the [place] FDA and has been authorized for detection and/or diagnosis of SARS-CoV-2 by FDA under an Emergency Use Authorization (EUA). This EUA will remain in effect (meaning this test can be used) for the duration of the COVID-19 declaration under Section 564(b)(1) of the Act, 21 U.S.C. section 360bbb-3(b)(1), unless the authorization is terminated or revoked.  Performed at [facility], [address] [place], [place] [address]   Respiratory (~20 pathogens) panel by PCR     Status: Abnormal   Collection Time: 05/29/21 12:37 PM   Specimen: Nasopharyngeal Swab; Respiratory  Result Value Ref  Range Status   Adenovirus NOT DETECTED NOT DETECTED Final   Coronavirus 229E NOT DETECTED NOT DETECTED Final    Comment: (NOTE) The Coronavirus on the Respiratory Panel, DOES NOT test for the novel  Coronavirus (2019 nCoV)    Coronavirus HKU1 NOT DETECTED NOT DETECTED Final   Coronavirus NL63 NOT DETECTED NOT DETECTED Final   Coronavirus OC43 NOT DETECTED NOT DETECTED Final   Metapneumovirus NOT DETECTED NOT DETECTED Final   Rhinovirus / Enterovirus DETECTED (A) NOT DETECTED Final   Influenza A NOT DETECTED NOT DETECTED Final   Influenza B NOT DETECTED NOT DETECTED Final   Parainfluenza Virus 1 NOT DETECTED NOT DETECTED Final   Parainfluenza Virus 2 NOT DETECTED NOT DETECTED Final   Parainfluenza Virus 3 NOT DETECTED NOT DETECTED Final   Parainfluenza Virus 4 NOT DETECTED NOT DETECTED Final   Respiratory Syncytial Virus NOT DETECTED NOT DETECTED Final   Bordetella pertussis NOT DETECTED NOT DETECTED Final   Bordetella Parapertussis NOT DETECTED NOT DETECTED Final   Chlamydophila pneumoniae NOT DETECTED NOT DETECTED Final   Mycoplasma pneumoniae NOT DETECTED NOT DETECTED Final    Comment: Performed at [facility], [place] [address] [place], [place] [address]  Resp Panel by RT-PCR (Flu A&B, Covid) Nasopharyngeal Swab     Status: None   Collection Time: 06/01/21  5:00 PM   Specimen: Nasopharyngeal Swab; Nasopharyngeal(NP) swabs in vial transport medium  Result Value Ref Range Status   SARS Coronavirus 2 by RT PCR NEGATIVE NEGATIVE Final    Comment: (NOTE) SARS-CoV-2 target nucleic acids are NOT DETECTED.  The SARS-CoV-2 RNA is generally detectable in upper respiratory specimens during the acute phase of infection. The lowest concentration of SARS-CoV-2 viral copies this assay  can detect is 138 copies/mL. A negative result does not preclude SARS-Cov-2 infection and should not be used as the sole basis for treatment or other patient management decisions. A negative result may occur with  improper specimen collection/handling, submission of specimen other than nasopharyngeal swab, presence of viral mutation(s) within the areas targeted by this assay, and inadequate number of viral copies(<138 copies/mL). A negative result must be combined with clinical observations, patient history, and epidemiological information. The expected result is Negative.  Fact Sheet for Patients:  EntrepreneurPulse.com.au  Fact Sheet for Healthcare Providers:  IncredibleEmployment.be  This test is no t yet approved or cleared by the [place] FDA and  has been authorized for detection and/or diagnosis of SARS-CoV-2 by FDA under an Emergency Use Authorization (EUA). This EUA will remain  in effect (meaning this test can be used) for the duration of the COVID-19 declaration under Section 564(b)(1) of the Act, 21 U.S.C.section 360bbb-3(b)(1), unless the authorization is terminated  or revoked sooner.       Influenza A by PCR NEGATIVE NEGATIVE Final   Influenza B by PCR NEGATIVE NEGATIVE Final    Comment: (NOTE) The Xpert Xpress SARS-CoV-2/FLU/RSV plus assay is intended as an aid in the diagnosis of influenza from Nasopharyngeal swab specimens and should not be used as a sole basis for treatment. Nasal washings and aspirates are unacceptable for Xpert Xpress SARS-CoV-2/FLU/RSV testing.  Fact Sheet for Patients: EntrepreneurPulse.com.au  Fact Sheet for Healthcare Providers: IncredibleEmployment.be  This test is not yet approved or cleared by the [place] FDA and has been authorized for detection and/or diagnosis of SARS-CoV-2 by FDA under an Emergency Use Authorization (EUA). This EUA will  remain in effect (meaning this test can be used) for the duration of the  COVID-19 declaration under Section 564(b)(1) of the Act, 21 U.S.C. section 360bbb-3(b)(1), unless the authorization is terminated or revoked.  Performed at [facility], [address], [place], [place] [address]          Radiology Studies: No results found.      Scheduled Meds:  atorvastatin  40 mg Oral QPM   diltiazem  120 mg Oral BID   feeding supplement  237 mL Oral TID BM   heparin  5,000 Units Subcutaneous Q8H   ipratropium-albuterol  3 mL Nebulization TID   montelukast  10 mg Oral QHS   multivitamin with minerals  1 tablet Oral Daily   nicotine  21 mg Transdermal Daily   pantoprazole  20 mg Oral Daily   predniSONE  40 mg Oral Q breakfast   sodium chloride flush  3 mL Intravenous Q12H   sodium chloride  1 g Oral TID WC   thiamine  100 mg Oral Daily   Continuous Infusions:   LOS: 5 days    Time spent: 25 minutes    Sidney Ace, MD [facility]   If 7PM-7AM, please contact night-coverage  06/02/2021, 2:52 PM

## 2021-06-03 MED ORDER — BUTALBITAL-APAP-CAFFEINE 50-325-40 MG PO TABS: 1.0000 | ORAL_TABLET | ORAL | Status: DC | PRN

## 2021-06-03 MED ORDER — PREDNISONE 20 MG PO TABS: 40.0000 mg | ORAL_TABLET | Freq: Every day | ORAL | 0 refills | Status: DC

## 2021-06-03 MED ORDER — SODIUM CHLORIDE 1 G PO TABS: 1.0000 g | ORAL_TABLET | Freq: Three times a day (TID) | ORAL | Status: DC

## 2021-06-03 NOTE — TOC Progression Note (Addendum)
Transition of Care [facility]) - Progression Note    Patient Details  Name: Candler Ginsberg MRN: [id number] Date of Birth: July 17, 1950  [facility] Contact  Eileen Stanford, LCSW Phone Number: 06/03/2021, 10:49 AM  Clinical Narrative:   Per CSW convo with pt this AM he states he wants [name] to make the decision. Per [name] she spoke with pt just a few minutes ago and he has agreed to go to [facility]. Auth is obtained, and covid test is recent. CSW has reached out to [facility] to make sure they can take pt today. CSW awaiting response. MD and RN updated.  Confirmed [facility] will take pt today. Room 704. MD notified.  Expected Discharge Plan: [place] Barriers to Discharge: Continued Medical Work up  Expected Discharge Plan and Services Expected Discharge Plan: [place]: PT, OT [facility] Agency: [place] Date [place]: 05/30/21 Time [place]: 67 Representative spoke with at [place]: [place] ([place]) Interventions    Readmission Risk Interventions Readmission Risk Prevention Plan 10/21/2020 09/27/2020 06/17/2020  Transportation Screening Complete Complete Complete  PCP or Specialist Appt within 3-5 Days Complete Complete Complete  HRI or Home Care Consult Complete Complete Complete  Social Work Consult for [place] Planning/Counseling Complete Not Complete Complete  SW consult not completed comments - RNCM assigned to patient -  Palliative Care Screening Not Applicable Not Applicable Not Applicable  Medication Review (RN Care Manager) Complete Complete Complete  Some recent data might be hidden

## 2021-06-03 NOTE — Progress Notes (Signed)
Report called to [facility], SBAR followed, questions asked and answered

## 2021-06-03 NOTE — Care Management Important Message (Signed)
Important Message  Patient Details  Name: Jeremiah [name] MRN: [id number] Date of Birth: Feb 28, 1951   Medicare Important Message Given:  Yes  Reviewed Medicare IM with patient via room phone due to isolation status.  Copy of Medicare IM placed in mail to home address on file.    Dannette Barbara 06/03/2021, 1:23 PM

## 2021-06-03 NOTE — TOC Transition Note (Signed)
Transition of Care [facility]) - CM/SW Discharge Note   Patient Details  Name: Jeremiah [name] MRN: [id number] Date of Birth: July 21, 1950  Transition of [facility]) CM/SW Contact:  Eileen Stanford, LCSW Phone Number: 06/03/2021, 11:28 AM   Clinical Narrative:   Clinical Social Worker facilitated patient discharge including contacting patient family and facility to confirm patient discharge plans.  Clinical information faxed to facility and family agreeable with plan.  CSW arranged ambulance transport via [facility] to [facility] room 704 .  RN to call [phone] for report prior to discharge.     Final next level of care: Skilled Nursing Facility Barriers to Discharge: No Barriers Identified   Patient Goals and CMS Choice Patient states their goals for this hospitalization and ongoing recovery are:: to go home CMS Medicare.gov Compare Post Acute Care list provided to:: Patient Represenative (must comment) (friend [name])    Discharge Placement              Patient chooses bed at:  ([facility]) Patient to be transferred to facility by: [facility] Name of family member notified: ANNETTE Patient and family notified of of transfer: 06/03/21  Discharge Plan and Services                          HH Arranged: PT, OT [facility] Agency: [place] Date [place]: 05/30/21 Time [place]: 29 Representative spoke with at [place]: [place] ([place]) Interventions     Readmission Risk Interventions Readmission Risk Prevention Plan 10/21/2020 09/27/2020 06/17/2020  Transportation Screening Complete Complete Complete  PCP or Specialist Appt within 3-5 Days Complete Complete Complete  HRI or Home Care Consult Complete Complete Complete  Social Work Consult for [place] Planning/Counseling Complete Not Complete Complete  SW consult not completed comments - RNCM assigned to patient -  Palliative Care Screening Not Applicable  Not Applicable Not Applicable  Medication Review (RN Care Manager) Complete Complete Complete  Some recent data might be hidden

## 2021-06-03 NOTE — Discharge Summary (Signed)
Physician Discharge Summary  Jeremiah [name] OZD:[id number] DOB: [birth year] DOA: 05/28/2021  PCP: [facility] date: 05/28/2021 Discharge date: 06/03/2021  Admitted From: Home Disposition: SNF  Recommendations for Outpatient Follow-up:  Follow up with PCP in 1-2 weeks   Home Health: No Equipment/Devices: Oxygen 4 L nasal cannula  Discharge Condition: Stable CODE STATUS: Full Diet recommendation: Heart healthy  Brief/Interim Summary: 71 year old M with PMH of COPD/chronic RF on 4 L, pneumonia, tobacco use disorder, HTN, anemia and chronic venous insufficiency presenting with shortness of breath, productive cough, DOE, chest pain with cough, and admitted with COPD exacerbation and hyponatremia to 120.  Reportedly saturating at 82% on RA when EMS arrived but recovered to 90% on 4 L.  In ED, he desaturated to 84% on 4 L by [place].  VBG with compensated respiratory acidosis.  BNP 101.  Serial troponin negative.  WBC 14.  CTA chest negative for PE but severe emphysema, PAH, multifocal symmetric peripheral pulmonary infiltrates concerning for atypical infection/inflammation, progressive, pathologic right hilar adenopathy and stable 12 mm RLL pulmonary nodule patient was started on BiPAP, IV Solu-Medrol and nebulizers.   RVP positive for rhinovirus.  Patient's respiratory status gradually stabilized.  At time of discharge he returned to baseline 4 L requirement.  Diminished breath sounds bilaterally but work of breathing has returned to baseline.  Stable for discharge.  After discussion decision was made to proceed with disposition to skilled nursing facility.    Discharge Diagnoses:  Principal Problem:   Acute and chronic respiratory failure (acute-on-chronic) (HCC) Active Problems:   Tobacco dependency   COPD, moderate (HCC)   Hyponatremia   HTN (hypertension)   Anemia   GERD (gastroesophageal reflux disease)   Acute respiratory failure with hypoxia (HCC)    Malnutrition of moderate degree  Acute on chronic respiratory failure with hypoxia and hypercapnia due to COPD exacerbation in the setting of viral URI and ongoing tobacco use: CTA negative for PE. RVP positive for rhinovirus. -Respiratory status stabilized Plan: Discharge to skilled nursing facility.  Complete additional 3 days for 5-day course of p.o. prednisone.  No further antibiotics.  Continue to encourage tobacco cessation.  Recommend NIPPV as necessary post discharge.  Follow-up outpatient PCP    Hyponatremia: Somewhat chronic.  Baseline in upper 120s.  Urine sodium <10 arguing against SIADH but hyponatremia improved with fluid restriction and p.o. sodium chloride.  Hyperaldosteronism? Plan: -Continue p.o. sodium chloride -Monitor sodium -Outpatient PCP versus nephrology follow-up   Hyperkalemia: Resolved   Essential hypertension: Normotensive -Continue home Cardizem.   Tobacco use disorder: Reports smoking about half a pack a day -Encouraged cessation. -Nicotine patch   Pulmonary nodule: CTA showed stable 12 mm RLL nodule -Repeat CT in 12 months.   Physical deconditioning-patient lives with friends.  Gets around holding to stuff in the house.  Has no walker. -PT/OT recommended SNF.  Patient now agreeable.  Plan [facility]   Leukocytosis/bandemia: Likely demargination from steroid. -Monitor   Migraine headache -Imitrex as needed -Tylenol as needed   Moderate protein calorie malnutrition -RD consultation  Discharge Instructions  Discharge Instructions     Diet - low sodium heart healthy   Complete by: As directed    Increase activity slowly   Complete by: As directed       Allergies as of 06/03/2021       Reactions   Penicillins Anaphylaxis, Swelling, Other (See Comments)   Tolerated Ceftriaxone 06/2020 05/2020 childhood reaction and his mother told  him that he "swelled up." Had a PCN reaction causing immediate rash,  facial/tongue/throat swelling, SOB or lightheadedness with hypotension: Yes Had a PCN reaction causing severe rash involving mucus membranes or skin necrosis: No Had a PCN reaction that required hospitalization No Had a PCN reaction occurring within the last 10 years: No If all the above answers are "NO", may proceed with Cephalosporin use.        Medication List     TAKE these medications    acetaminophen 325 MG tablet Commonly known as: TYLENOL Take 2 tablets (650 mg total) by mouth every 6 (six) hours as needed for mild pain (or Fever >/= 101).   albuterol 108 (90 Base) MCG/ACT inhaler Commonly known as: VENTOLIN HFA Inhale into the lungs every 6 (six) hours as needed for wheezing or shortness of breath.   atorvastatin 40 MG tablet Commonly known as: LIPITOR Take 40 mg by mouth every evening.   Breo Ellipta 100-25 MCG/ACT Aepb Generic drug: fluticasone furoate-vilanterol Inhale 1 puff into the lungs daily.   diltiazem 120 MG tablet Commonly known as: Cardizem Take 1 tablet (120 mg total) by mouth 2 (two) times daily.   ipratropium-albuterol 0.5-2.5 (3) MG/3ML Soln Commonly known as: DUONEB Take 3 mLs by nebulization every 6 (six) hours as needed.   lisinopril 10 MG tablet Commonly known as: ZESTRIL Take 10 mg by mouth daily.   montelukast 10 MG tablet Commonly known as: SINGULAIR Take 1 tablet (10 mg total) by mouth at bedtime.   nicotine 21 mg/24hr patch Commonly known as: NICODERM CQ - dosed in mg/24 hours Place 1 patch (21 mg total) onto the skin daily.   nicotine polacrilex 2 MG gum Commonly known as: NICORETTE Take 1 each (2 mg total) by mouth as needed for smoking cessation.   pantoprazole 20 MG tablet Commonly known as: PROTONIX Take 1 tablet (20 mg total) by mouth daily.   predniSONE 20 MG tablet Commonly known as: DELTASONE Take 2 tablets (40 mg total) by mouth daily with breakfast for 3 days. Start taking on: June 04, 2021 What changed:   medication strength how much to take how to take this when to take this additional instructions   sodium chloride 1 g tablet Take 1 tablet (1 g total) by mouth 3 (three) times daily with meals.   tiotropium 18 MCG inhalation capsule Commonly known as: SPIRIVA Place 18 mcg into inhaler and inhale daily.        Contact information for after-discharge care     Destination     [facility] Preferred SNF .   Service: Skilled Nursing Contact information: [place] [address] [phone]                    Allergies  Allergen Reactions   Penicillins Anaphylaxis, Swelling and Other (See Comments)    Tolerated Ceftriaxone 06/2020 05/2020 childhood reaction and his mother told him that he "swelled up."  Had a PCN reaction causing immediate rash, facial/tongue/throat swelling, SOB or lightheadedness with hypotension: Yes Had a PCN reaction causing severe rash involving mucus membranes or skin necrosis: No Had a PCN reaction that required hospitalization No Had a PCN reaction occurring within the last 10 years: No If all the above answers are "NO", may proceed with Cephalosporin use.    Consultations: None   Procedures/Studies: CT Angio Chest Pulmonary Embolism (PE) W or WO Contrast  Result Date: 05/28/2021 CLINICAL DATA:  Dyspnea, cyanosis, altered mental  status EXAM: CT ANGIOGRAPHY CHEST WITH CONTRAST TECHNIQUE: Multidetector CT imaging of the chest was performed using the standard protocol during bolus administration of intravenous contrast. Multiplanar CT image reconstructions and MIPs were obtained to evaluate the vascular anatomy. RADIATION DOSE REDUCTION: This exam was performed according to the departmental dose-optimization program which includes automated exposure control, adjustment of the mA and/or kV according to patient size and/or use of iterative reconstruction technique. CONTRAST:  73mL OMNIPAQUE IOHEXOL 350  MG/ML SOLN COMPARISON:  10/18/2020 FINDINGS: Cardiovascular: There is adequate opacification of the pulmonary arterial tree. No intraluminal filling defect identified to suggest acute pulmonary embolism. The central pulmonary arteries are enlarged in keeping with changes of pulmonary arterial hypertension. Extensive multi-vessel coronary artery calcification. Global cardiac size within normal limits. Degenerative calcification of the aortic valve leaflets noted. Trace pericardial effusion. Extensive atherosclerotic calcification of the thoracic aorta with particularly prominent atherosclerotic calcification of the origin of the left common carotid, left vertebral, and left subclavian arteries at their origin from the aortic arch. This almost certainly results in hemodynamically significant stenosis of the left vertebral artery origin. Mediastinum/Nodes: There is progressive pathologic right hilar adenopathy with multiple lymph nodes now measuring 16-17 mm in short axis diameter. Shotty subcarinal and left hilar adenopathy persists unchanged. Visualized thyroid is unremarkable. Esophagus is unremarkable. Lungs/Pleura: Severe centrilobular emphysema again noted since the prior examination there have developed scattered asymmetric mid and lower lung zone peripheral pulmonary infiltrates and areas of focal consolidation which may reflect changes of atypical infection, including viral pneumonia, in the acute setting. Stable mean 12 mm nodule within the subpleural right lower lobe, axial image # 88/6. No pneumothorax or pleural effusion. Central airways are widely patent. There are scattered areas of peripheral airway impaction noted, however, best appreciated within the lower lobes bilaterally. Upper Abdomen: No acute abnormality. Musculoskeletal: Osseous structures are age-appropriate. No acute bone abnormality. No lytic or blastic bone lesion. Review of the MIP images confirms the above findings. IMPRESSION: No  pulmonary embolism. Extensive coronary artery calcification. Degenerative calcification of the aortic valve leaflets. Echocardiography may be helpful to assess the degree of valvular dysfunction. Morphologic changes in keeping with pulmonary arterial hypertension. Severe emphysema. Interval development of multifocal asymmetric peripheral pulmonary infiltrates, most in keeping with atypical infection, including viral infection, or inflammation in the acute setting. Stable 12 mm right lower lobe pulmonary nodule, indeterminate. Repeat CT imaging in note 12-18 months would be helpful to assure continued stability. This recommendation follows the consensus statement: Guidelines for Management of Incidental Pulmonary Nodules Detected on CT Images: From the Fleischner Society 2017; Radiology 2017; 284:228-243. Progressive, pathologic right hilar adenopathy. This may be reactive in nature given the acute inflammatory findings, however, follow-up evaluation once the patient's acute issues have resolved may be helpful to document resolution. Peripheral vascular disease. If there is clinical evidence of vertebrobasilar insufficiency, CT arteriography may be helpful for further evaluation. Aortic Atherosclerosis (ICD10-I70.0) and Emphysema (ICD10-J43.9). Electronically Signed   By: Fidela Salisbury M.D.   On: 05/28/2021 22:03   DG Chest Portable 1 View  Result Date: 05/28/2021 CLINICAL DATA:  Shortness of breath EXAM: PORTABLE CHEST 1 VIEW COMPARISON:  05/20/2020, CT 10/18/2020, chest x-ray 05/14/2020 FINDINGS: Emphysema and diffuse bronchitic changes. Overall diffuse increased interstitial opacity compared to previous exam suggesting acute superimposed interstitial edema or inflammatory process. No confluent airspace disease, pleural effusion or pneumothorax. Stable cardiomediastinal silhouette with aortic atherosclerosis. IMPRESSION: Emphysema and bronchitic changes. Overall diffusely increased interstitial opacity is  suspicious for acute superimposed interstitial  edema or inflammatory process. No confluent airspace disease is seen. Electronically Signed   By: Donavan Foil M.D.   On: 05/28/2021 17:11      Subjective: Seen and examined on day of discharge.  Stable no distress.  Discharge Exam: Vitals:   06/03/21 0741 06/03/21 0822  BP:  (!) 120/42  Pulse:  70  Resp:  18  Temp:  98.7 F (37.1 C)  SpO2: 98% 98%   Vitals:   06/03/21 0416 06/03/21 0500 06/03/21 0741 06/03/21 0822  BP: (!) 98/50   (!) 120/42  Pulse: 68   70  Resp: 19   18  Temp: 98.1 F (36.7 C)   98.7 F (37.1 C)  TempSrc: Oral   Oral  SpO2: 98%  98% 98%  Weight:  70.6 kg    Height:        General: No acute distress.  Appears frail and chronically ill Cardiovascular: RRR, S1/S2 +, no rubs, no gallops Respiratory: Diminished breath sounds bilaterally.  Scattered crackles.  Normal work of breathing.  4 L Abdominal: Soft, NT, ND, bowel sounds + Extremities: no edema, no cyanosis    The results of significant diagnostics from this hospitalization (including imaging, microbiology, ancillary and laboratory) are listed below for reference.     Microbiology: Recent Results (from the past 240 hour(s))  Resp Panel by RT-PCR (Flu A&B, Covid) Nasopharyngeal Swab     Status: None   Collection Time: 05/28/21  5:06 PM   Specimen: Nasopharyngeal Swab; Nasopharyngeal(NP) swabs in vial transport medium  Result Value Ref Range Status   SARS Coronavirus 2 by RT PCR NEGATIVE NEGATIVE Final    Comment: (NOTE) SARS-CoV-2 target nucleic acids are NOT DETECTED.  The SARS-CoV-2 RNA is generally detectable in upper respiratory specimens during the acute phase of infection. The lowest concentration of SARS-CoV-2 viral copies this assay can detect is 138 copies/mL. A negative result does not preclude SARS-Cov-2 infection and should not be used as the sole basis for treatment or other patient management decisions. A negative result may  occur with  improper specimen collection/handling, submission of specimen other than nasopharyngeal swab, presence of viral mutation(s) within the areas targeted by this assay, and inadequate number of viral copies(<138 copies/mL). A negative result must be combined with clinical observations, patient history, and epidemiological information. The expected result is Negative.  Fact Sheet for Patients:  EntrepreneurPulse.com.au  Fact Sheet for Healthcare Providers:  IncredibleEmployment.be  This test is no t yet approved or cleared by the [place] FDA and  has been authorized for detection and/or diagnosis of SARS-CoV-2 by FDA under an Emergency Use Authorization (EUA). This EUA will remain  in effect (meaning this test can be used) for the duration of the COVID-19 declaration under Section 564(b)(1) of the Act, 21 U.S.C.section 360bbb-3(b)(1), unless the authorization is terminated  or revoked sooner.       Influenza A by PCR NEGATIVE NEGATIVE Final   Influenza B by PCR NEGATIVE NEGATIVE Final    Comment: (NOTE) The Xpert Xpress SARS-CoV-2/FLU/RSV plus assay is intended as an aid in the diagnosis of influenza from Nasopharyngeal swab specimens and should not be used as a sole basis for treatment. Nasal washings and aspirates are unacceptable for Xpert Xpress SARS-CoV-2/FLU/RSV testing.  Fact Sheet for Patients: EntrepreneurPulse.com.au  Fact Sheet for Healthcare Providers: IncredibleEmployment.be  This test is not yet approved or cleared by the [place] FDA and has been authorized for detection and/or diagnosis of SARS-CoV-2 by FDA under an Emergency  Use Authorization (EUA). This EUA will remain in effect (meaning this test can be used) for the duration of the COVID-19 declaration under Section 564(b)(1) of the Act, 21 U.S.C. section 360bbb-3(b)(1), unless the authorization is terminated  or revoked.  Performed at [facility], [place], [place] [address]   Respiratory (~20 pathogens) panel by PCR     Status: Abnormal   Collection Time: 05/29/21 12:37 PM   Specimen: Nasopharyngeal Swab; Respiratory  Result Value Ref Range Status   Adenovirus NOT DETECTED NOT DETECTED Final   Coronavirus 229E NOT DETECTED NOT DETECTED Final    Comment: (NOTE) The Coronavirus on the Respiratory Panel, DOES NOT test for the novel  Coronavirus (2019 nCoV)    Coronavirus HKU1 NOT DETECTED NOT DETECTED Final   Coronavirus NL63 NOT DETECTED NOT DETECTED Final   Coronavirus OC43 NOT DETECTED NOT DETECTED Final   Metapneumovirus NOT DETECTED NOT DETECTED Final   Rhinovirus / Enterovirus DETECTED (A) NOT DETECTED Final   Influenza A NOT DETECTED NOT DETECTED Final   Influenza B NOT DETECTED NOT DETECTED Final   Parainfluenza Virus 1 NOT DETECTED NOT DETECTED Final   Parainfluenza Virus 2 NOT DETECTED NOT DETECTED Final   Parainfluenza Virus 3 NOT DETECTED NOT DETECTED Final   Parainfluenza Virus 4 NOT DETECTED NOT DETECTED Final   Respiratory Syncytial Virus NOT DETECTED NOT DETECTED Final   Bordetella pertussis NOT DETECTED NOT DETECTED Final   Bordetella Parapertussis NOT DETECTED NOT DETECTED Final   Chlamydophila pneumoniae NOT DETECTED NOT DETECTED Final   Mycoplasma pneumoniae NOT DETECTED NOT DETECTED Final    Comment: Performed at [facility], [place]. [address], [place], [place] [address]  Resp Panel by RT-PCR (Flu A&B, Covid) Nasopharyngeal Swab     Status: None   Collection Time: 06/01/21  5:00 PM   Specimen: Nasopharyngeal Swab; Nasopharyngeal(NP) swabs in vial transport medium  Result Value Ref Range Status   SARS Coronavirus 2 by RT PCR NEGATIVE NEGATIVE Final    Comment: (NOTE) SARS-CoV-2 target nucleic acids are NOT DETECTED.  The SARS-CoV-2 RNA is generally detectable in upper respiratory specimens during the acute phase of infection. The  lowest concentration of SARS-CoV-2 viral copies this assay can detect is 138 copies/mL. A negative result does not preclude SARS-Cov-2 infection and should not be used as the sole basis for treatment or other patient management decisions. A negative result may occur with  improper specimen collection/handling, submission of specimen other than nasopharyngeal swab, presence of viral mutation(s) within the areas targeted by this assay, and inadequate number of viral copies(<138 copies/mL). A negative result must be combined with clinical observations, patient history, and epidemiological information. The expected result is Negative.  Fact Sheet for Patients:  EntrepreneurPulse.com.au  Fact Sheet for Healthcare Providers:  IncredibleEmployment.be  This test is no t yet approved or cleared by the [place] FDA and  has been authorized for detection and/or diagnosis of SARS-CoV-2 by FDA under an Emergency Use Authorization (EUA). This EUA will remain  in effect (meaning this test can be used) for the duration of the COVID-19 declaration under Section 564(b)(1) of the Act, 21 U.S.C.section 360bbb-3(b)(1), unless the authorization is terminated  or revoked sooner.       Influenza A by PCR NEGATIVE NEGATIVE Final   Influenza B by PCR NEGATIVE NEGATIVE Final    Comment: (NOTE) The Xpert Xpress SARS-CoV-2/FLU/RSV plus assay is intended as an aid in the diagnosis of influenza from Nasopharyngeal swab specimens and  should not be used as a sole basis for treatment. Nasal washings and aspirates are unacceptable for Xpert Xpress SARS-CoV-2/FLU/RSV testing.  Fact Sheet for Patients: EntrepreneurPulse.com.au  Fact Sheet for Healthcare Providers: IncredibleEmployment.be  This test is not yet approved or cleared by the [place] FDA and has been authorized for detection and/or diagnosis of SARS-CoV-2 by FDA under  an Emergency Use Authorization (EUA). This EUA will remain in effect (meaning this test can be used) for the duration of the COVID-19 declaration under Section 564(b)(1) of the Act, 21 U.S.C. section 360bbb-3(b)(1), unless the authorization is terminated or revoked.  Performed at [facility], [address] [place], [place] [address]      Labs: BNP (last 3 results) Recent Labs    09/26/20 1357 10/18/20 1930 05/28/21 1642  BNP 20.0 47.4 914.7*   Basic Metabolic Panel: Recent Labs  Lab 05/28/21 1642 05/28/21 2023 05/29/21 0448 05/29/21 0725 05/30/21 0436 05/30/21 0654 05/30/21 1448 05/30/21 2253 05/31/21 0634 06/01/21 0504  NA 124*  --  120*   < > 122* 122* 120* 125* 128* 126*  K 4.8  --  4.5  --  5.7*  --   --   --  5.3* 5.1  CL 83*  --  83*  --  86*  --   --   --  86* 85*  CO2 31  --  30  --  32  --   --   --  37* 37*  GLUCOSE 81  --  197*  --  169*  --   --   --  136* 150*  BUN 14  --  15  --  22  --   --   --  20 21  CREATININE 0.40* 0.50* 0.51*  --  0.50*  --   --   --  0.47* 0.54*  CALCIUM 8.9  --  8.6*  --  8.5*  --   --   --  8.7* 8.5*  MG  --   --   --   --  2.1  --   --   --  2.1 2.0  PHOS  --   --   --   --  3.0  --   --   --  2.5 2.6   < > = values in this interval not displayed.   Liver Function Tests: Recent Labs  Lab 05/28/21 1642 05/30/21 0436 05/31/21 0634 06/01/21 0504  AST 27  --   --   --   ALT 18  --   --   --   ALKPHOS 219*  --   --   --   BILITOT 0.7  --   --   --   PROT 8.2*  --   --   --   ALBUMIN 3.6 2.9* 2.9* 2.9*   No results for input(s): LIPASE, AMYLASE in the last 168 hours. No results for input(s): AMMONIA in the last 168 hours. CBC: Recent Labs  Lab 05/28/21 1642 05/28/21 2023 05/29/21 0448 05/30/21 0436 05/31/21 0634 06/01/21 0504  WBC 19.3* 17.6* 13.8* 16.5* 11.5* 10.8*  NEUTROABS 17.6*  --   --   --   --   --   HGB 11.5* 11.3* 10.5* 10.9* 11.2* 10.8*  HCT 35.9* 34.4* 32.4* 33.4* 34.7* 34.1*  MCV  86.1 85.1 84.6 84.8 86.5 85.5  PLT 369 367 340 356 378 370   Cardiac Enzymes: No results for input(s): CKTOTAL, CKMB, CKMBINDEX, TROPONINI in the last  168 hours. BNP: Invalid input(s): POCBNP CBG: Recent Labs  Lab 05/31/21 2148  GLUCAP 150*   D-Dimer No results for input(s): DDIMER in the last 72 hours. Hgb A1c No results for input(s): HGBA1C in the last 72 hours. Lipid Profile No results for input(s): CHOL, HDL, LDLCALC, TRIG, CHOLHDL, LDLDIRECT in the last 72 hours. Thyroid function studies No results for input(s): TSH, T4TOTAL, T3FREE, THYROIDAB in the last 72 hours.  Invalid input(s): FREET3 Anemia work up No results for input(s): VITAMINB12, FOLATE, FERRITIN, TIBC, IRON, RETICCTPCT in the last 72 hours. Urinalysis    Component Value Date/Time   COLORURINE YELLOW 06/02/2021 1145   APPEARANCEUR CLEAR 06/02/2021 1145   APPEARANCEUR Clear 09/12/2013 0920   LABSPEC 1.015 06/02/2021 1145   LABSPEC 1.010 09/12/2013 0920   PHURINE 6.5 06/02/2021 1145   GLUCOSEU NEGATIVE 06/02/2021 1145   GLUCOSEU Negative 09/12/2013 0920   HGBUR NEGATIVE 06/02/2021 1145   BILIRUBINUR NEGATIVE 06/02/2021 1145   BILIRUBINUR Negative 09/12/2013 0920   KETONESUR NEGATIVE 06/02/2021 1145   PROTEINUR NEGATIVE 06/02/2021 1145   NITRITE NEGATIVE 06/02/2021 1145   LEUKOCYTESUR NEGATIVE 06/02/2021 1145   LEUKOCYTESUR Negative 09/12/2013 0920   Sepsis Labs Invalid input(s): PROCALCITONIN,  WBC,  LACTICIDVEN Microbiology Recent Results (from the past 240 hour(s))  Resp Panel by RT-PCR (Flu A&B, Covid) Nasopharyngeal Swab     Status: None   Collection Time: 05/28/21  5:06 PM   Specimen: Nasopharyngeal Swab; Nasopharyngeal(NP) swabs in vial transport medium  Result Value Ref Range Status   SARS Coronavirus 2 by RT PCR NEGATIVE NEGATIVE Final    Comment: (NOTE) SARS-CoV-2 target nucleic acids are NOT DETECTED.  The SARS-CoV-2 RNA is generally detectable in upper respiratory specimens during  the acute phase of infection. The lowest concentration of SARS-CoV-2 viral copies this assay can detect is 138 copies/mL. A negative result does not preclude SARS-Cov-2 infection and should not be used as the sole basis for treatment or other patient management decisions. A negative result may occur with  improper specimen collection/handling, submission of specimen other than nasopharyngeal swab, presence of viral mutation(s) within the areas targeted by this assay, and inadequate number of viral copies(<138 copies/mL). A negative result must be combined with clinical observations, patient history, and epidemiological information. The expected result is Negative.  Fact Sheet for Patients:  EntrepreneurPulse.com.au  Fact Sheet for Healthcare Providers:  IncredibleEmployment.be  This test is no t yet approved or cleared by the [place] FDA and  has been authorized for detection and/or diagnosis of SARS-CoV-2 by FDA under an Emergency Use Authorization (EUA). This EUA will remain  in effect (meaning this test can be used) for the duration of the COVID-19 declaration under Section 564(b)(1) of the Act, 21 U.S.C.section 360bbb-3(b)(1), unless the authorization is terminated  or revoked sooner.       Influenza A by PCR NEGATIVE NEGATIVE Final   Influenza B by PCR NEGATIVE NEGATIVE Final    Comment: (NOTE) The Xpert Xpress SARS-CoV-2/FLU/RSV plus assay is intended as an aid in the diagnosis of influenza from Nasopharyngeal swab specimens and should not be used as a sole basis for treatment. Nasal washings and aspirates are unacceptable for Xpert Xpress SARS-CoV-2/FLU/RSV testing.  Fact Sheet for Patients: EntrepreneurPulse.com.au  Fact Sheet for Healthcare Providers: IncredibleEmployment.be  This test is not yet approved or cleared by the [place] FDA and has been authorized for detection and/or  diagnosis of SARS-CoV-2 by FDA under an Emergency Use Authorization (EUA). This EUA will remain in effect (  meaning this test can be used) for the duration of the COVID-19 declaration under Section 564(b)(1) of the Act, 21 U.S.C. section 360bbb-3(b)(1), unless the authorization is terminated or revoked.  Performed at [facility], [place], [place] [address]   Respiratory (~20 pathogens) panel by PCR     Status: Abnormal   Collection Time: 05/29/21 12:37 PM   Specimen: Nasopharyngeal Swab; Respiratory  Result Value Ref Range Status   Adenovirus NOT DETECTED NOT DETECTED Final   Coronavirus 229E NOT DETECTED NOT DETECTED Final    Comment: (NOTE) The Coronavirus on the Respiratory Panel, DOES NOT test for the novel  Coronavirus (2019 nCoV)    Coronavirus HKU1 NOT DETECTED NOT DETECTED Final   Coronavirus NL63 NOT DETECTED NOT DETECTED Final   Coronavirus OC43 NOT DETECTED NOT DETECTED Final   Metapneumovirus NOT DETECTED NOT DETECTED Final   Rhinovirus / Enterovirus DETECTED (A) NOT DETECTED Final   Influenza A NOT DETECTED NOT DETECTED Final   Influenza B NOT DETECTED NOT DETECTED Final   Parainfluenza Virus 1 NOT DETECTED NOT DETECTED Final   Parainfluenza Virus 2 NOT DETECTED NOT DETECTED Final   Parainfluenza Virus 3 NOT DETECTED NOT DETECTED Final   Parainfluenza Virus 4 NOT DETECTED NOT DETECTED Final   Respiratory Syncytial Virus NOT DETECTED NOT DETECTED Final   Bordetella pertussis NOT DETECTED NOT DETECTED Final   Bordetella Parapertussis NOT DETECTED NOT DETECTED Final   Chlamydophila pneumoniae NOT DETECTED NOT DETECTED Final   Mycoplasma pneumoniae NOT DETECTED NOT DETECTED Final    Comment: Performed at [facility], [place]. [address] [place], [place] [address]  Resp Panel by RT-PCR (Flu A&B, Covid) Nasopharyngeal Swab     Status: None   Collection Time: 06/01/21  5:00 PM   Specimen: Nasopharyngeal Swab; Nasopharyngeal(NP) swabs in vial  transport medium  Result Value Ref Range Status   SARS Coronavirus 2 by RT PCR NEGATIVE NEGATIVE Final    Comment: (NOTE) SARS-CoV-2 target nucleic acids are NOT DETECTED.  The SARS-CoV-2 RNA is generally detectable in upper respiratory specimens during the acute phase of infection. The lowest concentration of SARS-CoV-2 viral copies this assay can detect is 138 copies/mL. A negative result does not preclude SARS-Cov-2 infection and should not be used as the sole basis for treatment or other patient management decisions. A negative result may occur with  improper specimen collection/handling, submission of specimen other than nasopharyngeal swab, presence of viral mutation(s) within the areas targeted by this assay, and inadequate number of viral copies(<138 copies/mL). A negative result must be combined with clinical observations, patient history, and epidemiological information. The expected result is Negative.  Fact Sheet for Patients:  EntrepreneurPulse.com.au  Fact Sheet for Healthcare Providers:  IncredibleEmployment.be  This test is no t yet approved or cleared by the [place] FDA and  has been authorized for detection and/or diagnosis of SARS-CoV-2 by FDA under an Emergency Use Authorization (EUA). This EUA will remain  in effect (meaning this test can be used) for the duration of the COVID-19 declaration under Section 564(b)(1) of the Act, 21 U.S.C.section 360bbb-3(b)(1), unless the authorization is terminated  or revoked sooner.       Influenza A by PCR NEGATIVE NEGATIVE Final   Influenza B by PCR NEGATIVE NEGATIVE Final    Comment: (NOTE) The Xpert Xpress SARS-CoV-2/FLU/RSV plus assay is intended as an aid in the diagnosis of influenza from Nasopharyngeal swab specimens and should not be used as a sole basis for treatment.  Nasal washings and aspirates are unacceptable for Xpert Xpress SARS-CoV-2/FLU/RSV testing.  Fact  Sheet for Patients: EntrepreneurPulse.com.au  Fact Sheet for Healthcare Providers: IncredibleEmployment.be  This test is not yet approved or cleared by the [place] FDA and has been authorized for detection and/or diagnosis of SARS-CoV-2 by FDA under an Emergency Use Authorization (EUA). This EUA will remain in effect (meaning this test can be used) for the duration of the COVID-19 declaration under Section 564(b)(1) of the Act, 21 U.S.C. section 360bbb-3(b)(1), unless the authorization is terminated or revoked.  Performed at [facility], [address], [place], [place] [address]      Time coordinating discharge: Over 30 minutes  SIGNED:   Sidney Ace, MD  [facility] 06/03/2021, 10:55 AM Pager   If 7PM-7AM, please contact night-coverage

## 2021-06-04 ENCOUNTER — Emergency Department: Payer: Medicare HMO

## 2021-06-04 ENCOUNTER — Other Ambulatory Visit: Payer: Self-pay

## 2021-06-04 ENCOUNTER — Emergency Department
Admission: EM | Admit: 2021-06-04 | Discharge: 2021-06-04 | Disposition: A | Discharge status: Home / Self Care | Payer: Medicare HMO | Attending: Emergency Medicine | Admitting: Emergency Medicine

## 2021-06-04 ENCOUNTER — Encounter: Payer: Self-pay | Admitting: Emergency Medicine

## 2021-06-04 DIAGNOSIS — I10 Essential (primary) hypertension: Secondary | ICD-10-CM | POA: Insufficient documentation

## 2021-06-04 DIAGNOSIS — E875 Hyperkalemia: Secondary | ICD-10-CM | POA: Insufficient documentation

## 2021-06-04 DIAGNOSIS — Z20822 Contact with and (suspected) exposure to covid-19: Secondary | ICD-10-CM | POA: Insufficient documentation

## 2021-06-04 DIAGNOSIS — E871 Hypo-osmolality and hyponatremia: Secondary | ICD-10-CM | POA: Insufficient documentation

## 2021-06-04 DIAGNOSIS — J9611 Chronic respiratory failure with hypoxia: Secondary | ICD-10-CM | POA: Diagnosis not present

## 2021-06-04 DIAGNOSIS — Z720 Tobacco use: Secondary | ICD-10-CM | POA: Diagnosis not present

## 2021-06-04 DIAGNOSIS — J441 Chronic obstructive pulmonary disease with (acute) exacerbation: Secondary | ICD-10-CM

## 2021-06-04 DIAGNOSIS — R059 Cough, unspecified: Secondary | ICD-10-CM | POA: Diagnosis present

## 2021-06-04 LAB — COMPREHENSIVE METABOLIC PANEL
ALT: 29 U/L (ref 0–44)
AST: 24 U/L (ref 15–41)
Albumin: 3.3 g/dL — ABNORMAL LOW (ref 3.5–5.0)
Alkaline Phosphatase: 144 U/L — ABNORMAL HIGH (ref 38–126)
Anion gap: 6 (ref 5–15)
BUN: 23 mg/dL (ref 8–23)
CO2: 37 mmol/L — ABNORMAL HIGH (ref 22–32)
Calcium: 8.9 mg/dL (ref 8.9–10.3)
Chloride: 77 mmol/L — ABNORMAL LOW (ref 98–111)
Creatinine, Ser: 0.47 mg/dL — ABNORMAL LOW (ref 0.61–1.24)
GFR, Estimated: >60 mL/min (ref >60)
Glucose, Bld: 159 mg/dL — ABNORMAL HIGH (ref 70–99)
Potassium: 5.3 mmol/L — ABNORMAL HIGH (ref 3.5–5.1)
Sodium: 120 mmol/L — ABNORMAL LOW (ref 135–145)
Total Bilirubin: 0.7 mg/dL (ref 0.3–1.2)
Total Protein: 7.2 g/dL (ref 6.5–8.1)

## 2021-06-04 LAB — CBC WITH DIFFERENTIAL/PLATELET
Abs Immature Granulocytes: 0.06 10*3/uL (ref 0.00–0.07)
Basophils Absolute: 0 10*3/uL (ref 0.0–0.1)
Basophils Relative: 0 %
Eosinophils Absolute: 0 10*3/uL (ref 0.0–0.5)
Eosinophils Relative: 0 %
HCT: 38.1 % — ABNORMAL LOW (ref 39.0–52.0)
Hemoglobin: 12.2 g/dL — ABNORMAL LOW (ref 13.0–17.0)
Immature Granulocytes: 0 %
Lymphocytes Relative: 2 %
Lymphs Abs: 0.4 10*3/uL — ABNORMAL LOW (ref 0.7–4.0)
MCH: 27.4 pg (ref 26.0–34.0)
MCHC: 32 g/dL (ref 30.0–36.0)
MCV: 85.6 fL (ref 80.0–100.0)
Monocytes Absolute: 0.2 10*3/uL (ref 0.1–1.0)
Monocytes Relative: 1 %
Neutro Abs: 15.8 10*3/uL — ABNORMAL HIGH (ref 1.7–7.7)
Neutrophils Relative %: 97 %
Platelets: 379 10*3/uL (ref 150–400)
RBC: 4.45 MIL/uL (ref 4.22–5.81)
RDW: 16.2 % — ABNORMAL HIGH (ref 11.5–15.5)
WBC: 16.4 10*3/uL — ABNORMAL HIGH (ref 4.0–10.5)
nRBC: 0 % (ref 0.0–0.2)

## 2021-06-04 LAB — BLOOD GAS, VENOUS
Acid-Base Excess: 14.3 mmol/L — ABNORMAL HIGH (ref 0.0–2.0)
Bicarbonate: 43.7 mmol/L — ABNORMAL HIGH (ref 20.0–28.0)
O2 Saturation: 64.9 %
Patient temperature: 37
pCO2, Ven: 81 mmHg (ref 44.0–60.0)
pH, Ven: 7.34 (ref 7.250–7.430)
pO2, Ven: 36 mmHg (ref 32.0–45.0)

## 2021-06-04 LAB — RESP PANEL BY RT-PCR (FLU A&B, COVID) ARPGX2
Influenza A by PCR: NEGATIVE
Influenza B by PCR: NEGATIVE
SARS Coronavirus 2 by RT PCR: NEGATIVE

## 2021-06-04 LAB — TROPONIN I (HIGH SENSITIVITY): Troponin I (High Sensitivity): 6 ng/L (ref <18)

## 2021-06-04 LAB — PROCALCITONIN: Procalcitonin: <0.1 ng/mL

## 2021-06-04 MED ORDER — IPRATROPIUM-ALBUTEROL 0.5-2.5 (3) MG/3ML IN SOLN
9.0000 mL | Freq: Once | RESPIRATORY_TRACT | Status: AC
  Administered 2021-06-04: 9 mL via RESPIRATORY_TRACT
  Filled 2021-06-04: qty 9

## 2021-06-04 MED ORDER — IPRATROPIUM-ALBUTEROL 0.5-2.5 (3) MG/3ML IN SOLN
9.0000 mL | Freq: Once | RESPIRATORY_TRACT | Status: AC
  Administered 2021-06-04: 9 mL via RESPIRATORY_TRACT
  Filled 2021-06-04: qty 3
  Filled 2021-06-04: qty 9

## 2021-06-04 MED ORDER — DEXAMETHASONE 2 MG PO TABS: ORAL_TABLET | ORAL | 0 refills | Status: DC

## 2021-06-04 MED ORDER — METHYLPREDNISOLONE SODIUM SUCC 125 MG IJ SOLR
125.0000 mg | INTRAMUSCULAR | Status: AC
  Administered 2021-06-04: 125 mg via INTRAVENOUS
  Filled 2021-06-04: qty 2

## 2021-06-04 MED ORDER — DOXYCYCLINE HYCLATE 100 MG PO CAPS: 100.0000 mg | ORAL_CAPSULE | Freq: Two times a day (BID) | ORAL | 0 refills | Status: AC

## 2021-06-04 NOTE — ED Provider Notes (Signed)
[facility] Provider Note    Event Date/Time   First MD Initiated Contact with Patient 06/04/21 1355     (approximate)   History   Shortness of Breath   HPI  Jeremiah [name] is a 71 y.o. 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  Treated for COPD exacerbation thought to be secondary to rhinovirus who presents for assessment stating he felt he got acutely more short of breath today while at rehab.  States he also was coughing more and felt tight tightness across his chest.  He has not had any fevers, headache, earache, sore throat, vomiting, diarrhea, urinary symptoms, abdominal pain, back pain rash or extremity pain.  No recent falls or injuries.  On review of discharge summary it seems he was prescribed prednisone at discharge but she is not sure if he took this today or not.  He thinks he is taking his other medications.  Patient did receive a breathing treatment with EMS but no other treatments.      Physical Exam  Triage Vital Signs: ED Triage Vitals  Enc Vitals Group     BP 06/04/21 1353 130/66     Pulse Rate 06/04/21 1353 78     Resp --      Temp 06/04/21 1355 98.5 F (36.9 C)     Temp Source 06/04/21 1355 Oral     SpO2 06/04/21 1353 (!) 86 %     Weight 06/04/21 1354 155 lb 10.3 oz (70.6 kg)     Height 06/04/21 1354 6' (1.829 m)     Head Circumference --      Peak Flow --      Pain Score 06/04/21 1353 8     Pain Loc --      Pain Edu? --      Excl. in [place]? --     Most recent vital signs: Vitals:   06/04/21 1353 06/04/21 1355  BP: 130/66   Pulse: 78   Temp:  98.5 F (36.9 C)  SpO2: (!) 86%     General: Awake, appears fairly uncomfortable and in some distress. CV:  Good peripheral perfusion.  2+ radial pulses. Resp:  Tachypnea with increased effort and bilateral wheezing.  No  rhonchi. Abd:  No distention.  Soft throughout. Other:  Some very slight lower extremity edema.  Patient is awake alert and oriented.   ED Results / Procedures / Treatments  Labs (all labs ordered are listed, but only abnormal results are displayed) Labs Reviewed  BLOOD GAS, VENOUS - Abnormal; Notable for the following components:      Result Value   pCO2, Ven 81 (*)    Bicarbonate 43.7 (*)    Acid-Base Excess 14.3 (*)    All other components within normal limits  CBC WITH DIFFERENTIAL/PLATELET - Abnormal; Notable for the following components:   WBC 16.4 (*)    Hemoglobin 12.2 (*)    HCT 38.1 (*)    RDW 16.2 (*)    Neutro Abs 15.8 (*)    Lymphs Abs 0.4 (*)    All other components within normal limits  RESP PANEL BY RT-PCR (FLU A&B, COVID) [id number]  COMPREHENSIVE METABOLIC PANEL  PROCALCITONIN  TROPONIN I (HIGH SENSITIVITY)  TROPONIN I (HIGH SENSITIVITY)     EKG  Sinus rhythm with a ventricular rate of 76, significant artifact throughout  with some nonspecific changes in inferior leads without other clearance of acute ischemia or significant arrhythmia.   RADIOLOGY  Chest x-ray shows some chronic bronchitic appearing changes and perihilar thickening with some opacification at the right base.  This appears slightly worse than chest x-ray obtained on 1/21 which I also reviewed.  There is no other clear acute focal consolidation, pneumothorax, effusion, edema or other clear acute thoracic process.  Also reviewed radiology's interpretation.    PROCEDURES:  Critical Care performed: No  .1-3 Lead EKG Interpretation Performed by: Lucrezia Starch, MD Authorized by: Lucrezia Starch, MD     Interpretation: normal     ECG rate assessment: normal     Rhythm: sinus rhythm     Ectopy: none     Conduction: normal    The patient is on the cardiac monitor to evaluate for evidence of arrhythmia and/or significant heart rate changes.   MEDICATIONS ORDERED IN ED: Medications   ipratropium-albuterol (DUONEB) 0.5-2.5 (3) MG/3ML nebulizer solution 9 mL (9 mLs Nebulization Given 06/04/21 1409)  methylPREDNISolone sodium succinate (SOLU-MEDROL) 125 mg/2 mL injection 125 mg (125 mg Intravenous Given 06/04/21 1410)  ipratropium-albuterol (DUONEB) 0.5-2.5 (3) MG/3ML nebulizer solution 9 mL (9 mLs Nebulization Given 06/04/21 1514)     IMPRESSION / MDM / ASSESSMENT AND PLAN / ED COURSE  I reviewed the triage vital signs and the nursing notes.                              Patient initially noted to have an SPO2 of 86% charted but this was on room air unclear why this was obtained as patient is chronically on 4 L.  On 4 L patient is able to maintain SPO2 greater than 85%.  Differential diagnosis includes, but is not limited to recurrent COPD exacerbation, pneumonia, pneumothorax, CHF, ACS, arrhythmia, anemia, metabolic derangements with lower suspicion for PE as patient states he feels very similar to when he last came to the hospital couple days ago and underwent a CTA on 1/21 that I reviewed that showed no evidence of PE.  EKG is remarkable for sinus rhythm with a ventricular rate of 76, significant artifact throughout with some nonspecific changes in inferior leads without other clearance of acute ischemia or significant arrhythmia.  Chest x-ray shows some chronic bronchitic appearing changes and perihilar thickening with some opacification at the right base.  This appears slightly worse than chest x-ray obtained on 1/21 which I also reviewed.  There is no other clear acute focal consolidation, pneumothorax, effusion, edema or other clear acute thoracic process.  Also reviewed radiology's interpretation.   COVID influenza PCR is negative.  Patient given duo nebs and Solu-Medrol on arrival.  On reassessment after initial DuoNeb he still has bilateral wheezing and another DuoNeb was ordered.  VBG with a pH of 7.34 with a PCO2 of 81 and a bicarb of 43.7 consistent with very mild  acute on chronic hypercarbic respiratory failure.  Care patient signed over to assuming father approximately 1515.  Plan is to reassess and follow-up labs.  Patient is able to ambulate and has no evidence of hypoxic respiratory failure on his chronic 4 L with otherwise reassuring work-up and he is feeling better I think he can be safely discharged back to his SNF with outpatient follow-up.  Otherwise he will require admission for COPD exacerbation.       FINAL CLINICAL IMPRESSION(S) / ED DIAGNOSES   Final diagnoses:  COPD exacerbation (HCC)  Chronic respiratory failure with hypoxia ([place])  Tobacco abuse     Rx / DC Orders   ED Discharge Orders     None        Note:  This document was prepared using Dragon voice recognition software and may include unintentional dictation errors.   Lucrezia Starch, MD 06/04/21 1539

## 2021-06-04 NOTE — ED Provider Notes (Signed)
Procedures     ----------------------------------------- 4:55 PM on 06/04/2021 -----------------------------------------  Patient feeling better, speaking without difficulty.  Tolerating oral intake.  Labs are reassuring with chronic hypercapnia which is compensated on blood gas.  He has a mildly low sodium level compared to his baseline and a mildly elevated potassium level, which I think is most likely due to some adrenal suppression from chronic steroid use.  Due to his acute exacerbation and need for oral steroids, I will have him take dexamethasone over a 19-day taper.  Recommend close follow-up with primary care.  He does not require admission since he is stable on his baseline level of supplemental oxygen and is feeling better.  He is stable for discharge back to nursing facility.     Carrie Mew, MD 06/04/21 [phone]

## 2021-06-04 NOTE — ED Triage Notes (Signed)
Pt to ED via [facility] from home for shortness of breath. Pt seen last week for same. Pt was DC from [facility] to [facility]. Per EMS pt has diffuse wheezing bilaterally. They attempted to give pt a duoneb treatment but he was only able to finish half of the treatment.

## 2021-06-04 NOTE — Discharge Instructions (Signed)
Please take doxycycline to help control your lung symptoms.  We would like to change your oral steroid from prednisone to dexamethasone, which should be tapered over about 3 weeks to ensure that your COPD symptoms stay under control.

## 2021-06-15 ENCOUNTER — Emergency Department: Payer: Medicare HMO

## 2021-06-15 ENCOUNTER — Inpatient Hospital Stay
Admission: EM | Admit: 2021-06-15 | Discharge: 2021-06-24 | DRG: 643 | Disposition: A | Discharge status: Home Health | Payer: Medicare HMO | Attending: Internal Medicine | Admitting: Internal Medicine

## 2021-06-15 ENCOUNTER — Observation Stay (HOSPITAL_COMMUNITY)
Admit: 2021-06-15 | Discharge: 2021-06-15 | Disposition: A | Discharge status: Home / Self Care | Payer: Medicare HMO | Attending: Internal Medicine | Admitting: Internal Medicine

## 2021-06-15 ENCOUNTER — Other Ambulatory Visit: Payer: Self-pay

## 2021-06-15 DIAGNOSIS — J432 Centrilobular emphysema: Secondary | ICD-10-CM | POA: Diagnosis present

## 2021-06-15 DIAGNOSIS — J449 Chronic obstructive pulmonary disease, unspecified: Secondary | ICD-10-CM | POA: Diagnosis not present

## 2021-06-15 DIAGNOSIS — F17211 Nicotine dependence, cigarettes, in remission: Secondary | ICD-10-CM

## 2021-06-15 DIAGNOSIS — Z7951 Long term (current) use of inhaled steroids: Secondary | ICD-10-CM

## 2021-06-15 DIAGNOSIS — F17200 Nicotine dependence, unspecified, uncomplicated: Secondary | ICD-10-CM | POA: Diagnosis present

## 2021-06-15 DIAGNOSIS — L89152 Pressure ulcer of sacral region, stage 2: Secondary | ICD-10-CM | POA: Diagnosis present

## 2021-06-15 DIAGNOSIS — E871 Hypo-osmolality and hyponatremia: Secondary | ICD-10-CM | POA: Diagnosis present

## 2021-06-15 DIAGNOSIS — E861 Hypovolemia: Secondary | ICD-10-CM | POA: Diagnosis present

## 2021-06-15 DIAGNOSIS — E222 Syndrome of inappropriate secretion of antidiuretic hormone: Secondary | ICD-10-CM | POA: Diagnosis not present

## 2021-06-15 DIAGNOSIS — E43 Unspecified severe protein-calorie malnutrition: Secondary | ICD-10-CM | POA: Insufficient documentation

## 2021-06-15 DIAGNOSIS — D509 Iron deficiency anemia, unspecified: Secondary | ICD-10-CM | POA: Diagnosis present

## 2021-06-15 DIAGNOSIS — F17210 Nicotine dependence, cigarettes, uncomplicated: Secondary | ICD-10-CM | POA: Diagnosis present

## 2021-06-15 DIAGNOSIS — Z6821 Body mass index (BMI) 21.0-21.9, adult: Secondary | ICD-10-CM

## 2021-06-15 DIAGNOSIS — K219 Gastro-esophageal reflux disease without esophagitis: Secondary | ICD-10-CM | POA: Diagnosis not present

## 2021-06-15 DIAGNOSIS — G43909 Migraine, unspecified, not intractable, without status migrainosus: Secondary | ICD-10-CM | POA: Diagnosis present

## 2021-06-15 DIAGNOSIS — J9611 Chronic respiratory failure with hypoxia: Secondary | ICD-10-CM | POA: Diagnosis present

## 2021-06-15 DIAGNOSIS — E860 Dehydration: Secondary | ICD-10-CM

## 2021-06-15 DIAGNOSIS — E785 Hyperlipidemia, unspecified: Secondary | ICD-10-CM | POA: Diagnosis present

## 2021-06-15 DIAGNOSIS — I2510 Atherosclerotic heart disease of native coronary artery without angina pectoris: Secondary | ICD-10-CM | POA: Diagnosis present

## 2021-06-15 DIAGNOSIS — R55 Syncope and collapse: Secondary | ICD-10-CM | POA: Diagnosis not present

## 2021-06-15 DIAGNOSIS — Z9981 Dependence on supplemental oxygen: Secondary | ICD-10-CM

## 2021-06-15 DIAGNOSIS — I10 Essential (primary) hypertension: Secondary | ICD-10-CM | POA: Diagnosis present

## 2021-06-15 DIAGNOSIS — L89312 Pressure ulcer of right buttock, stage 2: Secondary | ICD-10-CM | POA: Diagnosis present

## 2021-06-15 DIAGNOSIS — I700 Atherosclerosis of aorta: Secondary | ICD-10-CM | POA: Diagnosis present

## 2021-06-15 DIAGNOSIS — Z20822 Contact with and (suspected) exposure to covid-19: Secondary | ICD-10-CM | POA: Diagnosis present

## 2021-06-15 DIAGNOSIS — J441 Chronic obstructive pulmonary disease with (acute) exacerbation: Secondary | ICD-10-CM | POA: Diagnosis present

## 2021-06-15 DIAGNOSIS — B370 Candidal stomatitis: Secondary | ICD-10-CM | POA: Diagnosis present

## 2021-06-15 DIAGNOSIS — L8990 Pressure ulcer of unspecified site, unspecified stage: Secondary | ICD-10-CM | POA: Diagnosis present

## 2021-06-15 DIAGNOSIS — Z79899 Other long term (current) drug therapy: Secondary | ICD-10-CM

## 2021-06-15 DIAGNOSIS — D72829 Elevated white blood cell count, unspecified: Secondary | ICD-10-CM | POA: Diagnosis not present

## 2021-06-15 DIAGNOSIS — R634 Abnormal weight loss: Secondary | ICD-10-CM | POA: Diagnosis present

## 2021-06-15 LAB — CBC
HCT: 28.7 % — ABNORMAL LOW (ref 39.0–52.0)
Hemoglobin: 9.7 g/dL — ABNORMAL LOW (ref 13.0–17.0)
MCH: 27.3 pg (ref 26.0–34.0)
MCHC: 33.8 g/dL (ref 30.0–36.0)
MCV: 80.8 fL (ref 80.0–100.0)
Platelets: 351 10*3/uL (ref 150–400)
RBC: 3.55 MIL/uL — ABNORMAL LOW (ref 4.22–5.81)
RDW: 15.3 % (ref 11.5–15.5)
WBC: 13.9 10*3/uL — ABNORMAL HIGH (ref 4.0–10.5)
nRBC: 0 % (ref 0.0–0.2)

## 2021-06-15 LAB — RESP PANEL BY RT-PCR (FLU A&B, COVID) ARPGX2
Influenza A by PCR: NEGATIVE
Influenza B by PCR: NEGATIVE
SARS Coronavirus 2 by RT PCR: NEGATIVE

## 2021-06-15 LAB — URINALYSIS, ROUTINE W REFLEX MICROSCOPIC
Bilirubin Urine: NEGATIVE
Glucose, UA: NEGATIVE mg/dL
Hgb urine dipstick: NEGATIVE
Ketones, ur: NEGATIVE mg/dL
Leukocytes,Ua: NEGATIVE
Nitrite: NEGATIVE
Protein, ur: NEGATIVE mg/dL
Specific Gravity, Urine: 1.018 (ref 1.005–1.030)
pH: 6 (ref 5.0–8.0)

## 2021-06-15 LAB — BASIC METABOLIC PANEL
Anion gap: 7 (ref 5–15)
BUN: 16 mg/dL (ref 8–23)
CO2: 27 mmol/L (ref 22–32)
Calcium: 8.2 mg/dL — ABNORMAL LOW (ref 8.9–10.3)
Chloride: 83 mmol/L — ABNORMAL LOW (ref 98–111)
Creatinine, Ser: 0.45 mg/dL — ABNORMAL LOW (ref 0.61–1.24)
GFR, Estimated: >60 mL/min (ref >60)
Glucose, Bld: 101 mg/dL — ABNORMAL HIGH (ref 70–99)
Potassium: 4.4 mmol/L (ref 3.5–5.1)
Sodium: 117 mmol/L — CL (ref 135–145)

## 2021-06-15 LAB — MAGNESIUM: Magnesium: 1.7 mg/dL (ref 1.7–2.4)

## 2021-06-15 LAB — TSH
TSH: 1.936 u[IU]/mL (ref 0.350–4.500)
TSH: 1.723 u[IU]/mL (ref 0.350–4.500)

## 2021-06-15 LAB — PHOSPHORUS: Phosphorus: 2.9 mg/dL (ref 2.5–4.6)

## 2021-06-15 LAB — SODIUM, URINE, RANDOM: Sodium, Ur: 130 mmol/L

## 2021-06-15 LAB — OSMOLALITY: Osmolality: 250 mOsm/kg — ABNORMAL LOW (ref 275–295)

## 2021-06-15 LAB — BRAIN NATRIURETIC PEPTIDE: B Natriuretic Peptide: 56 pg/mL (ref 0.0–100.0)

## 2021-06-15 LAB — OSMOLALITY, URINE: Osmolality, Ur: 686 mOsm/kg (ref 300–900)

## 2021-06-15 LAB — TROPONIN I (HIGH SENSITIVITY): Troponin I (High Sensitivity): 6 ng/L (ref <18)

## 2021-06-15 MED ORDER — IPRATROPIUM-ALBUTEROL 0.5-2.5 (3) MG/3ML IN SOLN
3.0000 mL | Freq: Four times a day (QID) | RESPIRATORY_TRACT | Status: DC | PRN
  Administered 2021-06-16 – 2021-06-20 (×7): 3 mL via RESPIRATORY_TRACT
  Filled 2021-06-15 (×8): qty 3

## 2021-06-15 MED ORDER — SODIUM CHLORIDE 0.9 % IV SOLN: INTRAVENOUS | Status: AC

## 2021-06-15 MED ORDER — ACETAMINOPHEN 325 MG PO TABS
650.0000 mg | ORAL_TABLET | Freq: Four times a day (QID) | ORAL | Status: DC | PRN
  Administered 2021-06-16 – 2021-06-20 (×8): 650 mg via ORAL
  Filled 2021-06-15 (×8): qty 2

## 2021-06-15 MED ORDER — SODIUM CHLORIDE 0.9% FLUSH
3.0000 mL | Freq: Two times a day (BID) | INTRAVENOUS | Status: DC
  Administered 2021-06-15 – 2021-06-24 (×16): 3 mL via INTRAVENOUS

## 2021-06-15 MED ORDER — DULOXETINE HCL 30 MG PO CPEP
30.0000 mg | ORAL_CAPSULE | Freq: Every day | ORAL | Status: DC
  Administered 2021-06-16 – 2021-06-24 (×9): 30 mg via ORAL
  Filled 2021-06-15 (×9): qty 1

## 2021-06-15 MED ORDER — ALBUTEROL SULFATE (2.5 MG/3ML) 0.083% IN NEBU
3.0000 mL | INHALATION_SOLUTION | Freq: Four times a day (QID) | RESPIRATORY_TRACT | Status: DC | PRN
  Administered 2021-06-15 – 2021-06-21 (×4): 3 mL via RESPIRATORY_TRACT
  Filled 2021-06-15 (×5): qty 3

## 2021-06-15 MED ORDER — ENOXAPARIN SODIUM 40 MG/0.4ML IJ SOSY
40.0000 mg | PREFILLED_SYRINGE | INTRAMUSCULAR | Status: DC
  Administered 2021-06-15 – 2021-06-23 (×9): 40 mg via SUBCUTANEOUS
  Filled 2021-06-15 (×9): qty 0.4

## 2021-06-15 MED ORDER — ATORVASTATIN CALCIUM 20 MG PO TABS
20.0000 mg | ORAL_TABLET | Freq: Every day | ORAL | Status: DC
  Administered 2021-06-15 – 2021-06-23 (×8): 20 mg via ORAL
  Filled 2021-06-15 (×9): qty 1

## 2021-06-15 MED ORDER — NICOTINE POLACRILEX 2 MG MT GUM
2.0000 mg | CHEWING_GUM | OROMUCOSAL | Status: DC | PRN
  Filled 2021-06-15 (×3): qty 1

## 2021-06-15 MED ORDER — DEXAMETHASONE 6 MG PO TABS
6.0000 mg | ORAL_TABLET | Freq: Every day | ORAL | Status: DC
  Administered 2021-06-16 – 2021-06-19 (×5): 6 mg via ORAL
  Filled 2021-06-15 (×6): qty 1

## 2021-06-15 MED ORDER — SODIUM CHLORIDE 1 G PO TABS
1.0000 g | ORAL_TABLET | Freq: Three times a day (TID) | ORAL | Status: DC
  Filled 2021-06-15 (×2): qty 1

## 2021-06-15 MED ORDER — PANTOPRAZOLE SODIUM 20 MG PO TBEC
20.0000 mg | DELAYED_RELEASE_TABLET | Freq: Every day | ORAL | Status: DC
  Administered 2021-06-16 – 2021-06-24 (×10): 20 mg via ORAL
  Filled 2021-06-15 (×11): qty 1

## 2021-06-15 MED ORDER — ACETAMINOPHEN 650 MG RE SUPP: 650.0000 mg | Freq: Four times a day (QID) | RECTAL | Status: DC | PRN

## 2021-06-15 MED ORDER — NYSTATIN 100000 UNIT/ML MT SUSP
5.0000 mL | Freq: Four times a day (QID) | OROMUCOSAL | Status: DC
  Administered 2021-06-16 – 2021-06-24 (×33): 500000 [IU] via OROMUCOSAL
  Filled 2021-06-15 (×33): qty 5

## 2021-06-15 MED ORDER — SODIUM CHLORIDE 1 G PO TABS
1.0000 g | ORAL_TABLET | Freq: Three times a day (TID) | ORAL | Status: DC
  Administered 2021-06-16 (×3): 1 g via ORAL
  Filled 2021-06-15 (×5): qty 1

## 2021-06-15 NOTE — H&P (Signed)
History and Physical   Jeremiah [name] IRW:[id number] DOB: 08-Jun-1950 DOA: 06/15/2021  PCP: [place]  Patient coming from: [facility]  I have personally briefly reviewed patient's old medical records in [place].  Chief Concern: Syncope  HPI: 71 year old male with history of GERD, depression, anxiety, hyperlipidemia, tobacco dependence, who presents to the emergency department for chief concerns of confusion/Loss of consciousness.  Initial vitals in the emergency department show temperature of 98, respiration rate of 18, heart rate of 67, blood pressure 108/65, SPO2 of 97% on 4 L baseline nasal cannula.  Labs in the emergency department showed serum sodium 117, potassium 4.4, chloride 83, bicarb 27, BUN of 16, serum creatinine of 0.45, nonfasting blood glucose 101.  BNP was 56.  Troponin was 6.  Patient had leukocytosis of 13.9, hemoglobin 9.7, platelets of 351.  TSH was 1.936.  UA was negative for leukocytes and nitrates.  In the emergency department patient was given sodium chloride 100 mL/h, for approximately 5 hours.  At bedside he is able to tell me his name, age, current location of hospital, current year is 2023.   He endorses baseline shortness of breath and headache, as he has history of cluster migraines. He states both these symptoms are not new.   He does not know if he passed out. He feels weak. He states that for the last month, his mouth has been feeling raw and it hurts him to swallow. He states this has caused a fear of eating and endorses poor po intake. He endorses nausea in the AM.  He denies vomiting, chest pain, shortness of breath, dysuria, diarrhea, swelling in his legs. He endorses unintentional weight loss of about 27 pounds in 1 month.  Social history: From [facility]. He is tobacco user, had not smoked in 40 days. At his [facility], he smoked nearly 1 ppd. He started smoking at age 61. He denies etoh and recreational drug  use. Formerly, he worked for [employer] in [place].   Vaccination history: He is vaccinated for covid, 2 doses of J&J.  ROS: Constitutional: no weight change, no fever ENT/Mouth: no sore throat, no rhinorrhea Eyes: no eye pain, no vision changes Cardiovascular: no chest pain, no dyspnea,  no edema, no palpitations Respiratory: no cough, no sputum, no wheezing Gastrointestinal: no nausea, no vomiting, no diarrhea, no constipation Genitourinary: no urinary incontinence, no dysuria, no hematuria Musculoskeletal: no arthralgias, no myalgias Skin: no skin lesions, no pruritus, Neuro: + weakness, no loss of consciousness, no syncope Psych: no anxiety, no depression, + decrease appetite Heme/Lymph: no bruising, no bleeding  ED Course: Patient requiring hospitalization for chief concerns of hyponatremia.  Assessment/Plan  Principal Problem:   Syncope Active Problems:   Tobacco dependency   COPD, moderate (HCC)   Nicotine dependence   Hyponatremia   HTN (hypertension)   GERD (gastroesophageal reflux disease)   Thrush, oral   Unintentional weight loss   * Syncope Assessment & Plan - Etiology work-up in progress - Orthostatic vitals ordered - Query secondary to poor p.o. intake for about 1 month and on several antihypertensive medications - Complete echo ordered - Admit to telemetry cardiac, observation  Hyponatremia Assessment & Plan - Etiology work-up at this time - Acute on chronic, I suspect this is secondary to SIADH worsened by poor p.o. intake in the setting of thrush - Patient has history of SIADH - Epic order placed for nephrology - A.m. team to reach out to nephrology for consultation - Resumed home  salt tablets 3 times daily - Encourage p.o. intake  HTN (hypertension) Assessment & Plan - Patient takes diltiazem 120 mg p.o. twice daily, lisinopril 10 mg daily - These have not been resumed as patient is currently in the low normal tensive and I suspect that he has  had orthostatic hypotension - Hydralazine injection 5 mg IV every 6 hours as needed for SBP greater than 170, 4 doses ordered  Unintentional weight loss-likely multifactorial in setting of poor p.o. intake with thrush -Treat as below and recommend outpatient follow-up  COPD, moderate ([place]) Assessment & Plan - Resumed home inhalers  Thrush, oral Assessment & Plan -Nystatin suspension 4 times daily    Tobacco dependency Assessment & Plan - Nicotine patch resumed   Chart reviewed.   DVT prophylaxis: Enoxaparin Code Status: Full code Diet: Heart Family Communication: No Disposition Plan: Pending clinical course Consults called: Nephrology placed in epic Admission status: Telemetry cardiac, observation  Past Medical History:  Diagnosis Date   Asthma    COPD (chronic obstructive pulmonary disease) ([place])    O2 dependent - 2L   GERD (gastroesophageal reflux disease)    HTN (hypertension)    Oxygen dependent    Tobacco dependence    Past Surgical History:  Procedure Laterality Date   CATARACT EXTRACTION W/PHACO Left 12/09/2019   Procedure: CATARACT EXTRACTION PHACO AND INTRAOCULAR LENS PLACEMENT (IOC) COMPLICATED LEFT 55.73 22:02.5;  Surgeon: Birder Robson, MD;  Location: [facility];  Service: Ophthalmology;  Laterality: Left;  MILOOP VISION BLUE   CATARACT EXTRACTION W/PHACO Right 01/27/2020   Procedure: CATARACT EXTRACTION PHACO AND INTRAOCULAR LENS PLACEMENT ([place]) RIGHT VISION BLUE 26.83 02:07.6;  Surgeon: Birder Robson, MD;  Location: [facility];  Service: Ophthalmology;  Laterality: Right;   HERNIA REPAIR     NECK SURGERY     Social History:  reports that he has been smoking cigarettes. He has a 25.00 pack-year smoking history. He has never used smokeless tobacco. He reports current alcohol use. He reports that he does not use drugs.  Allergies  Allergen Reactions   Penicillins Anaphylaxis, Swelling and Other (See Comments)    Tolerated  Ceftriaxone 06/2020 05/2020 childhood reaction and his mother told him that he "swelled up."  Had a PCN reaction causing immediate rash, facial/tongue/throat swelling, SOB or lightheadedness with hypotension: Yes Had a PCN reaction causing severe rash involving mucus membranes or skin necrosis: No Had a PCN reaction that required hospitalization No Had a PCN reaction occurring within the last 10 years: No If all the above answers are "NO", may proceed with Cephalosporin use.   Family History  Problem Relation Age of Onset   Cancer - Colon Father    Congestive Heart Failure Mother    Family history: Family history reviewed and not pertinent.  Prior to Admission medications   Medication Sig Start Date End Date Taking? Authorizing Provider  atorvastatin (LIPITOR) 20 MG tablet Take 20 mg by mouth daily.   Yes [provider]  BREO ELLIPTA 100-25 MCG/INH AEPB Inhale 1 puff into the lungs daily. 09/30/20  Yes Jennye Boroughs, MD  dexamethasone (DECADRON) 6 MG tablet Take 6 mg by mouth daily. End date 06/16/21   Yes [provider]  diltiazem (CARDIZEM) 120 MG tablet Take 1 tablet (120 mg total) by mouth 2 (two) times daily. 09/30/20  Yes Jennye Boroughs, MD  DULoxetine (CYMBALTA) 30 MG capsule Take 30 mg by mouth daily.   Yes [provider]  guaiFENesin-dextromethorphan (ROBITUSSIN DM) 100-10 MG/5ML syrup Take  5 mLs by mouth every 6 (six) hours as needed for cough.   Yes [provider]  ipratropium-albuterol (DUONEB) 0.5-2.5 (3) MG/3ML SOLN Take 3 mLs by nebulization every 6 (six) hours as needed. 09/30/20  Yes Jennye Boroughs, MD  lisinopril (ZESTRIL) 10 MG tablet Take 10 mg by mouth daily. 05/16/21  Yes [provider]  montelukast (SINGULAIR) 10 MG tablet Take 1 tablet (10 mg total) by mouth at bedtime. 09/30/20  Yes Jennye Boroughs, MD  mupirocin ointment (BACTROBAN) 2 % Apply 1 application topically 2 (two) times daily.   Yes [provider]   nicotine (NICODERM CQ - DOSED IN MG/24 HOURS) 21 mg/24hr patch Place 1 patch (21 mg total) onto the skin daily. 10/23/20  Yes Eulogio Bear U, DO  nystatin (MYCOSTATIN) 100000 UNIT/ML suspension Take 5 mLs by mouth 4 (four) times daily.   Yes [provider]  pantoprazole (PROTONIX) 20 MG tablet Take 1 tablet (20 mg total) by mouth daily. 09/30/20  Yes Jennye Boroughs, MD  sodium chloride 1 g tablet Take 1 tablet (1 g total) by mouth 3 (three) times daily with meals. 06/03/21  Yes Sreenath, Sudheer B, MD  tiotropium (SPIRIVA) 18 MCG inhalation capsule Place 18 mcg into inhaler and inhale daily.   Yes [provider]  acetaminophen (TYLENOL) 325 MG tablet Take 2 tablets (650 mg total) by mouth every 6 (six) hours as needed for mild pain (or Fever >/= 101). 08/06/15   Dustin Flock, MD  albuterol (VENTOLIN HFA) 108 (90 Base) MCG/ACT inhaler Inhale into the lungs every 6 (six) hours as needed for wheezing or shortness of breath.    [provider]  atorvastatin (LIPITOR) 40 MG tablet Take 40 mg by mouth every evening. Patient not taking: Reported on 06/15/2021    [provider]  nicotine polacrilex (NICORETTE) 2 MG gum Take 1 each (2 mg total) by mouth as needed for smoking cessation. 10/23/20   Geradine Girt, DO  Physical Exam: Vitals:   06/15/21 2046 06/15/21 2125 06/15/21 2242 06/15/21 2340  BP: (!) 153/74   (!) 113/55  Pulse: 72   69  Resp: 19   19  Temp: 98.3 F (36.8 C)   98.2 F (36.8 C)  TempSrc: Oral     SpO2: 93%  95% 98%  Weight:  70.6 kg    Height:  6' (1.829 m)     Constitutional: appears age-appropriate, frail, NAD, calm, comfortable Eyes: PERRL, lids and conjunctivae normal ENMT: Mucous membranes are dry.  Posterior oropharyngeal thrush. Age-appropriate dentition. Hearing appropriate    Neck: normal, supple, no masses, no thyromegaly Respiratory: clear to auscultation bilaterally, no wheezing, no crackles. Normal respiratory effort. No  accessory muscle use.  Cardiovascular: Regular rate and rhythm, no murmurs / rubs / gallops. No extremity edema. 2+ pedal pulses. No carotid bruits.  Abdomen: no tenderness, no masses palpated, no hepatosplenomegaly. Bowel sounds positive.  Musculoskeletal: no clubbing / cyanosis. No joint deformity upper and lower extremities. Good ROM, no contractures, no atrophy. Normal muscle tone.  Skin: no rashes, lesions, ulcers. No induration Neurologic: Sensation intact. Strength 5/5 in all 4.  Psychiatric: Normal judgment and insight. Alert and oriented x 3. Normal mood.   EKG: independently reviewed, showing sinus rhythm with rate of 68, QTc 421  Chest x-ray on Admission: I personally reviewed and I agree with radiologist reading as below.  DG Chest 2 View  Result Date: 06/15/2021 CLINICAL DATA:  Shortness of breath EXAM: CHEST - 2 VIEW  COMPARISON:  06/04/2021, CT chest 05/28/2021, chest x-ray 05/14/2020 FINDINGS: Advanced emphysema and diffuse bronchitic changes. No focal opacity, pleural effusion, or pneumothorax. Stable cardiomediastinal silhouette. Aortic atherosclerosis. IMPRESSION: Emphysema and chronic bronchitic changes without acute airspace disease Electronically Signed   By: Donavan Foil M.D.   On: 06/15/2021 17:06   CT HEAD WO CONTRAST (5MM)  Result Date: 06/15/2021 CLINICAL DATA:  Mental status change, unknown cause. Three episodes of loss of consciousness today. EXAM: CT HEAD WITHOUT CONTRAST TECHNIQUE: Contiguous axial images were obtained from the base of the skull through the vertex without intravenous contrast. RADIATION DOSE REDUCTION: This exam was performed according to the departmental dose-optimization program which includes automated exposure control, adjustment of the mA and/or kV according to patient size and/or use of iterative reconstruction technique. COMPARISON:  None. FINDINGS: Brain: There is no evidence of an acute infarct, intracranial hemorrhage, mass, midline shift, or  extra-axial fluid collection. The ventricles and sulci are within normal limits for age. Patchy and confluent hypodensities in the cerebral white matter bilaterally are nonspecific but compatible with extensive chronic small vessel ischemic disease. Vascular: Calcified atherosclerosis at the skull base. No hyperdense vessel. Skull: No acute fracture or suspicious osseous lesion. Sinuses/Orbits: Mild right ethmoid air cell mucosal thickening. Trace bilateral mastoid fluid. Bilateral cataract extraction. Other: None. IMPRESSION: 1. No evidence of acute intracranial abnormality. 2. Extensive chronic small vessel ischemic disease. Electronically Signed   By: Logan Bores M.D.   On: 06/15/2021 16:53    Labs on Admission: I have personally reviewed following labs CBC: Recent Labs  Lab 06/15/21 1511  WBC 13.9*  HGB 9.7*  HCT 28.7*  MCV 80.8  PLT 111   Basic Metabolic Panel: Recent Labs  Lab 06/15/21 1511 06/15/21 2042  NA 117*  --   K 4.4  --   CL 83*  --   CO2 27  --   GLUCOSE 101*  --   BUN 16  --   CREATININE 0.45*  --   CALCIUM 8.2*  --   MG  --  1.7  PHOS  --  2.9   GFR: Estimated Creatinine Clearance: 85.8 mL/min (A) (by C-G formula based on SCr of 0.45 mg/dL (L)).  Thyroid Function Tests: Recent Labs    06/15/21 2042  TSH 1.723   Anemia Panel: No results for input(s): VITAMINB12, FOLATE, FERRITIN, TIBC, IRON, RETICCTPCT in the last 72 hours. Urine analysis:    Component Value Date/Time   COLORURINE YELLOW (A) 06/15/2021 1720   APPEARANCEUR CLEAR (A) 06/15/2021 1720   APPEARANCEUR Clear 09/12/2013 0920   LABSPEC 1.018 06/15/2021 1720   LABSPEC 1.010 09/12/2013 0920   PHURINE 6.0 06/15/2021 1720   GLUCOSEU NEGATIVE 06/15/2021 1720   GLUCOSEU Negative 09/12/2013 0920   HGBUR NEGATIVE 06/15/2021 1720   BILIRUBINUR NEGATIVE 06/15/2021 1720   BILIRUBINUR Negative 09/12/2013 0920   KETONESUR NEGATIVE 06/15/2021 1720   PROTEINUR NEGATIVE 06/15/2021 1720   NITRITE  NEGATIVE 06/15/2021 1720   LEUKOCYTESUR NEGATIVE 06/15/2021 1720   LEUKOCYTESUR Negative 09/12/2013 0920   Dr. Tobie Poet [facility]  If 7PM-7AM, please contact overnight-coverage provider If 7AM-7PM, please contact day coverage provider [email or web address]  06/16/2021, 1:03 AM

## 2021-06-15 NOTE — ED Notes (Signed)
Pt states unable to urinate at this time. MD informed and will try again in 15 mins.

## 2021-06-15 NOTE — ED Notes (Signed)
Echocardiogram performed at the bedside

## 2021-06-15 NOTE — ED Provider Notes (Signed)
[facility] Provider Note    Event Date/Time   First MD Initiated Contact with Patient 06/15/21 1532     (approximate)   History   Loss of Consciousness   HPI  Jeremiah [name] is a 71 y.o. male here with loss of consciousness.  Patient has a history of COPD on 4 L nasal cannula, tobacco use, hypertension, here with syncope.  Patient states that over the last several days, he has felt somewhat fatigued.  He thought this was due to the oxygen machine in his room not working well and he believes he has been intermittently going without enough oxygen.  Earlier today, patient reportedly had witnessed episodes in which she lost consciousness and became less responsive.  There was no shaking or seizure-like activity and he quickly regained it.  He does not recall these episodes.  He denies any chest pain.  He states he currently has a mild headache but this is not necessarily new for him.  He has a history of migraines.  He states this has been slightly worse over the last few days but again he thought this was related to his oxygen.  Denies any focal numbness or weakness.  No other complaints.     Physical Exam   Triage Vital Signs: ED Triage Vitals  Enc Vitals Group     BP 06/15/21 1512 139/67     Pulse Rate 06/15/21 1512 68     Resp 06/15/21 1512 18     Temp 06/15/21 1512 98 F (36.7 C)     Temp src --      SpO2 06/15/21 1512 99 %     Weight --      Height --      Head Circumference --      [facility] --      Pain Score 06/15/21 1507 3     Pain Loc --      Pain Edu? --      Excl. in [place]? --     Most recent vital signs: Vitals:   06/15/21 2242 06/15/21 2340  BP:  (!) 113/55  Pulse:  69  Resp:  19  Temp:  98.2 F (36.8 C)  SpO2: 95% 98%     General: Awake, no distress.  CV:  Good peripheral perfusion.  Regular rate and rhythm. Resp:  Normal effort.  No wheezes. Abd:  No distention.  No tenderness. Other:  Cranial nerves II through XII  intact.  Strength out of 5 bilateral upper and lower extremities.  Normal sensation to light touch.  Gait deferred.  Mildly dry mucous membranes.  Slightly decreased skin turgor.   ED Results / Procedures / Treatments   Labs (all labs ordered are listed, but only abnormal results are displayed) Labs Reviewed  BASIC METABOLIC PANEL - Abnormal; Notable for the following components:      Result Value   Sodium 117 (*)    Chloride 83 (*)    Glucose, Bld 101 (*)    Creatinine, Ser 0.45 (*)    Calcium 8.2 (*)    All other components within normal limits  CBC - Abnormal; Notable for the following components:   WBC 13.9 (*)    RBC 3.55 (*)    Hemoglobin 9.7 (*)    HCT 28.7 (*)    All other components within normal limits  OSMOLALITY - Abnormal; Notable for the following components:   Osmolality 250 (*)    All other components within  normal limits  URINALYSIS, ROUTINE W REFLEX MICROSCOPIC - Abnormal; Notable for the following components:   Color, Urine YELLOW (*)    APPearance CLEAR (*)    All other components within normal limits  RESP PANEL BY RT-PCR (FLU A&B, COVID) ARPGX2  OSMOLALITY, URINE  SODIUM, URINE, RANDOM  TSH  BRAIN NATRIURETIC PEPTIDE  MAGNESIUM  TSH  PHOSPHORUS  HIV ANTIBODY (ROUTINE TESTING W REFLEX)  COMPREHENSIVE METABOLIC PANEL  CBC  TROPONIN I (HIGH SENSITIVITY)     EKG Normal sinus rhythm, ventricular rate 68.  PR 141, QRS 98, QTc 421.  No acute ST elevations or depressions.  EKG evidence of acute ischemic infarct.   RADIOLOGY CT head: No acute intracranial abnormality Chest x-ray: Emphysema, chronic changes   I also independently reviewed and agree wit radiologist interpretations.   PROCEDURES:  Critical Care performed: Yes, see critical care procedure note(s)  .1-3 Lead EKG Interpretation Performed by: Duffy Bruce, MD Authorized by: Duffy Bruce, MD     Interpretation: normal     ECG rate:  70-90   ECG rate assessment: normal      Rhythm: sinus rhythm     Ectopy: none     Conduction: normal   Comments:     Indication: Shortness of breath   MEDICATIONS ORDERED IN ED: Medications  0.9 %  sodium chloride infusion ( Intravenous New Bag/Given 06/15/21 1832)  atorvastatin (LIPITOR) tablet 20 mg (20 mg Oral Given 06/15/21 2225)  DULoxetine (CYMBALTA) DR capsule 30 mg (has no administration in time range)  nicotine polacrilex (NICORETTE) gum 2 mg (has no administration in time range)  dexamethasone (DECADRON) tablet 6 mg (6 mg Oral Given 06/16/21 0001)  pantoprazole (PROTONIX) EC tablet 20 mg (20 mg Oral Given 06/16/21 0001)  albuterol (PROVENTIL) (2.5 MG/3ML) 0.083% nebulizer solution 3 mL (3 mLs Inhalation Given 06/15/21 2242)  ipratropium-albuterol (DUONEB) 0.5-2.5 (3) MG/3ML nebulizer solution 3 mL (has no administration in time range)  sodium chloride flush (NS) 0.9 % injection 3 mL (3 mLs Intravenous Given 06/15/21 2249)  enoxaparin (LOVENOX) injection 40 mg (40 mg Subcutaneous Given 06/15/21 2225)  acetaminophen (TYLENOL) tablet 650 mg (has no administration in time range)    Or  acetaminophen (TYLENOL) suppository 650 mg (has no administration in time range)  nystatin (MYCOSTATIN) 100000 UNIT/ML suspension 500,000 Units (500,000 Units Mouth/Throat Given 06/16/21 0008)  sodium chloride tablet 1 g (1 g Oral Not Given 06/16/21 0011)  hydrALAZINE (APRESOLINE) injection 5 mg (has no administration in time range)     IMPRESSION / MDM / ASSESSMENT AND PLAN / ED COURSE  I reviewed the triage vital signs and the nursing notes.                               The patient is on the cardiac monitor to evaluate for evidence of arrhythmia and/or significant heart rate changes.    MDM:  71 year old male with extensive past medical history including COPD, hypertension, chronic venous insufficiency, here with syncopal episode.  Patient does not recall the details of his episode.  Clinically, I suspect this could have been orthostatic in the  setting of sitting up with significant hypokalemia.  The patient appears severely clinically dehydrated.  He has had recurrent issues with hyponatremia and appears to be acutely hyponatremic today, with sodium 117.  Creatinine normal.  CBC shows mild, nonspecific leukocytosis.  Osmolality and urine electrolytes are consistent with likely hypovolemic hyponatremia.  Chest x-ray  shows no acute disease.  CT head shows no acute intracranial abnormality.  I reviewed his previous records including recent DC summaries and previous admission for hyponatremia, patient said intermittent SIADH but clinically I suspect this more so due to hypovolemia.  Will give cautious fluids and plan to admit to medicine.  He has had no ectopy or signs of arrhythmia here.  No focal neurological ptosis.  He had no seizure-like activity.   MEDICATIONS GIVEN IN ED: Medications  0.9 %  sodium chloride infusion ( Intravenous New Bag/Given 06/15/21 1832)  atorvastatin (LIPITOR) tablet 20 mg (20 mg Oral Given 06/15/21 2225)  DULoxetine (CYMBALTA) DR capsule 30 mg (has no administration in time range)  nicotine polacrilex (NICORETTE) gum 2 mg (has no administration in time range)  dexamethasone (DECADRON) tablet 6 mg (6 mg Oral Given 06/16/21 0001)  pantoprazole (PROTONIX) EC tablet 20 mg (20 mg Oral Given 06/16/21 0001)  albuterol (PROVENTIL) (2.5 MG/3ML) 0.083% nebulizer solution 3 mL (3 mLs Inhalation Given 06/15/21 2242)  ipratropium-albuterol (DUONEB) 0.5-2.5 (3) MG/3ML nebulizer solution 3 mL (has no administration in time range)  sodium chloride flush (NS) 0.9 % injection 3 mL (3 mLs Intravenous Given 06/15/21 2249)  enoxaparin (LOVENOX) injection 40 mg (40 mg Subcutaneous Given 06/15/21 2225)  acetaminophen (TYLENOL) tablet 650 mg (has no administration in time range)    Or  acetaminophen (TYLENOL) suppository 650 mg (has no administration in time range)  nystatin (MYCOSTATIN) 100000 UNIT/ML suspension 500,000 Units (500,000 Units  Mouth/Throat Given 06/16/21 0008)  sodium chloride tablet 1 g (1 g Oral Not Given 06/16/21 0011)  hydrALAZINE (APRESOLINE) injection 5 mg (has no administration in time range)     Consults:     EMR reviewed  Previous admissions, including recent admission in January 2023 for COPD exacerbation and mild hyponatremia     FINAL CLINICAL IMPRESSION(S) / ED DIAGNOSES   Final diagnoses:  Hyponatremia  Dehydration     Rx / DC Orders   ED Discharge Orders     None        Note:  This document was prepared using Dragon voice recognition software and may include unintentional dictation errors.   Duffy Bruce, MD 06/16/21 [phone]

## 2021-06-15 NOTE — ED Triage Notes (Addendum)
Pt comes via EMS from [facility] with c/o 3 LOC episodes today. Pt apparently had 3 10-15 minutes LOC. EMS reports pt was on 4L at 90%. One duoneb given.  BP-110/55 HR-68 RA-84%, 4L-90%, 97% on neb CBG-128 RR-19  20 left arm.  Pt is on fluid restrictions per facility. Pt states some SOB. Pt has hx of COPD.

## 2021-06-15 NOTE — ED Notes (Signed)
Pt given urinal for urine 

## 2021-06-15 NOTE — ED Notes (Signed)
Critical sodium reported to Dr Ellender Hose

## 2021-06-15 NOTE — ED Notes (Signed)
Pt remains in bed. Requests coffee. Able to answer questions Aox4 slowly. Aware of being kept in hospital. Called friend for pt and updated friend. Denies other needs.

## 2021-06-15 NOTE — ED Notes (Signed)
Covid swab sent.

## 2021-06-15 NOTE — ED Notes (Signed)
Report received from Jessica, RN.

## 2021-06-16 ENCOUNTER — Encounter: Payer: Self-pay | Admitting: Internal Medicine

## 2021-06-16 DIAGNOSIS — I2510 Atherosclerotic heart disease of native coronary artery without angina pectoris: Secondary | ICD-10-CM | POA: Diagnosis present

## 2021-06-16 DIAGNOSIS — Z79899 Other long term (current) drug therapy: Secondary | ICD-10-CM | POA: Diagnosis not present

## 2021-06-16 DIAGNOSIS — B370 Candidal stomatitis: Secondary | ICD-10-CM | POA: Diagnosis present

## 2021-06-16 DIAGNOSIS — E222 Syndrome of inappropriate secretion of antidiuretic hormone: Secondary | ICD-10-CM | POA: Diagnosis present

## 2021-06-16 DIAGNOSIS — F17210 Nicotine dependence, cigarettes, uncomplicated: Secondary | ICD-10-CM | POA: Diagnosis present

## 2021-06-16 DIAGNOSIS — E785 Hyperlipidemia, unspecified: Secondary | ICD-10-CM | POA: Diagnosis present

## 2021-06-16 DIAGNOSIS — L89312 Pressure ulcer of right buttock, stage 2: Secondary | ICD-10-CM | POA: Diagnosis present

## 2021-06-16 DIAGNOSIS — R634 Abnormal weight loss: Secondary | ICD-10-CM | POA: Diagnosis present

## 2021-06-16 DIAGNOSIS — Z6821 Body mass index (BMI) 21.0-21.9, adult: Secondary | ICD-10-CM | POA: Diagnosis not present

## 2021-06-16 DIAGNOSIS — Z9981 Dependence on supplemental oxygen: Secondary | ICD-10-CM | POA: Diagnosis not present

## 2021-06-16 DIAGNOSIS — Z20822 Contact with and (suspected) exposure to covid-19: Secondary | ICD-10-CM | POA: Diagnosis present

## 2021-06-16 DIAGNOSIS — E861 Hypovolemia: Secondary | ICD-10-CM | POA: Diagnosis present

## 2021-06-16 DIAGNOSIS — E43 Unspecified severe protein-calorie malnutrition: Secondary | ICD-10-CM | POA: Diagnosis present

## 2021-06-16 DIAGNOSIS — I700 Atherosclerosis of aorta: Secondary | ICD-10-CM | POA: Diagnosis present

## 2021-06-16 DIAGNOSIS — E871 Hypo-osmolality and hyponatremia: Secondary | ICD-10-CM | POA: Diagnosis not present

## 2021-06-16 DIAGNOSIS — J9611 Chronic respiratory failure with hypoxia: Secondary | ICD-10-CM | POA: Diagnosis present

## 2021-06-16 DIAGNOSIS — G43909 Migraine, unspecified, not intractable, without status migrainosus: Secondary | ICD-10-CM | POA: Diagnosis present

## 2021-06-16 DIAGNOSIS — D72829 Elevated white blood cell count, unspecified: Secondary | ICD-10-CM | POA: Diagnosis not present

## 2021-06-16 DIAGNOSIS — J449 Chronic obstructive pulmonary disease, unspecified: Secondary | ICD-10-CM | POA: Diagnosis not present

## 2021-06-16 DIAGNOSIS — J432 Centrilobular emphysema: Secondary | ICD-10-CM | POA: Diagnosis present

## 2021-06-16 DIAGNOSIS — D509 Iron deficiency anemia, unspecified: Secondary | ICD-10-CM | POA: Diagnosis present

## 2021-06-16 DIAGNOSIS — K219 Gastro-esophageal reflux disease without esophagitis: Secondary | ICD-10-CM | POA: Diagnosis present

## 2021-06-16 DIAGNOSIS — I10 Essential (primary) hypertension: Secondary | ICD-10-CM | POA: Diagnosis present

## 2021-06-16 DIAGNOSIS — E860 Dehydration: Secondary | ICD-10-CM | POA: Diagnosis present

## 2021-06-16 DIAGNOSIS — Z7951 Long term (current) use of inhaled steroids: Secondary | ICD-10-CM | POA: Diagnosis not present

## 2021-06-16 DIAGNOSIS — L89152 Pressure ulcer of sacral region, stage 2: Secondary | ICD-10-CM | POA: Diagnosis present

## 2021-06-16 DIAGNOSIS — L8990 Pressure ulcer of unspecified site, unspecified stage: Secondary | ICD-10-CM | POA: Diagnosis present

## 2021-06-16 DIAGNOSIS — R55 Syncope and collapse: Secondary | ICD-10-CM | POA: Diagnosis present

## 2021-06-16 LAB — COMPREHENSIVE METABOLIC PANEL
ALT: 28 U/L (ref 0–44)
AST: 20 U/L (ref 15–41)
Albumin: 2.6 g/dL — ABNORMAL LOW (ref 3.5–5.0)
Alkaline Phosphatase: 106 U/L (ref 38–126)
Anion gap: 5 (ref 5–15)
BUN: 15 mg/dL (ref 8–23)
CO2: 27 mmol/L (ref 22–32)
Calcium: 7.8 mg/dL — ABNORMAL LOW (ref 8.9–10.3)
Chloride: 85 mmol/L — ABNORMAL LOW (ref 98–111)
Creatinine, Ser: 0.31 mg/dL — ABNORMAL LOW (ref 0.61–1.24)
GFR, Estimated: >60 mL/min (ref >60)
Glucose, Bld: 96 mg/dL (ref 70–99)
Potassium: 4.2 mmol/L (ref 3.5–5.1)
Sodium: 117 mmol/L — CL (ref 135–145)
Total Bilirubin: 0.8 mg/dL (ref 0.3–1.2)
Total Protein: 5.3 g/dL — ABNORMAL LOW (ref 6.5–8.1)

## 2021-06-16 LAB — ECHOCARDIOGRAM COMPLETE
AR max vel: 1.99 cm2
AV Area VTI: 2.36 cm2
AV Area mean vel: 2.25 cm2
AV Mean grad: 4 mmHg
AV Peak grad: 10 mmHg
Ao pk vel: 1.58 m/s
Area-P 1/2: 2.95 cm2
S' Lateral: 3.21 cm

## 2021-06-16 LAB — SODIUM
Sodium: 114 mmol/L — CL (ref 135–145)
Sodium: 114 mmol/L — CL (ref 135–145)
Sodium: 115 mmol/L — CL (ref 135–145)

## 2021-06-16 LAB — GLUCOSE, CAPILLARY
Glucose-Capillary: 109 mg/dL — ABNORMAL HIGH (ref 70–99)
Glucose-Capillary: 113 mg/dL — ABNORMAL HIGH (ref 70–99)

## 2021-06-16 LAB — CBC
HCT: 26.8 % — ABNORMAL LOW (ref 39.0–52.0)
Hemoglobin: 9.4 g/dL — ABNORMAL LOW (ref 13.0–17.0)
MCH: 27.6 pg (ref 26.0–34.0)
MCHC: 35.1 g/dL (ref 30.0–36.0)
MCV: 78.6 fL — ABNORMAL LOW (ref 80.0–100.0)
Platelets: 335 10*3/uL (ref 150–400)
RBC: 3.41 MIL/uL — ABNORMAL LOW (ref 4.22–5.81)
RDW: 15.3 % (ref 11.5–15.5)
WBC: 14.7 10*3/uL — ABNORMAL HIGH (ref 4.0–10.5)
nRBC: 0 % (ref 0.0–0.2)

## 2021-06-16 LAB — HIV ANTIBODY (ROUTINE TESTING W REFLEX): HIV Screen 4th Generation wRfx: NONREACTIVE

## 2021-06-16 LAB — OSMOLALITY: Osmolality: 250 mOsm/kg — ABNORMAL LOW (ref 275–295)

## 2021-06-16 LAB — SODIUM, URINE, RANDOM: Sodium, Ur: 117 mmol/L

## 2021-06-16 LAB — OSMOLALITY, URINE: Osmolality, Ur: 829 mOsm/kg (ref 300–900)

## 2021-06-16 LAB — MRSA NEXT GEN BY PCR, NASAL: MRSA by PCR Next Gen: DETECTED — AB

## 2021-06-16 MED ORDER — SODIUM CHLORIDE 0.9 % IV SOLN
INTRAVENOUS | Status: DC
  Administered 2021-06-16: 500 mL via INTRAVENOUS

## 2021-06-16 MED ORDER — HYDRALAZINE HCL 20 MG/ML IJ SOLN: 5.0000 mg | Freq: Four times a day (QID) | INTRAMUSCULAR | Status: DC | PRN

## 2021-06-16 MED ORDER — ADULT MULTIVITAMIN W/MINERALS CH
1.0000 | ORAL_TABLET | Freq: Every day | ORAL | Status: DC
  Administered 2021-06-16 – 2021-06-24 (×9): 1 via ORAL
  Filled 2021-06-16 (×9): qty 1

## 2021-06-16 MED ORDER — CHLORHEXIDINE GLUCONATE CLOTH 2 % EX PADS
6.0000 | MEDICATED_PAD | Freq: Every day | CUTANEOUS | Status: DC
  Administered 2021-06-16 – 2021-06-20 (×3): 6 via TOPICAL

## 2021-06-16 MED ORDER — BOOST / RESOURCE BREEZE PO LIQD CUSTOM
1.0000 | Freq: Three times a day (TID) | ORAL | Status: DC
  Administered 2021-06-16 (×2): 1 via ORAL
  Administered 2021-06-17: 237 mL via ORAL
  Administered 2021-06-17 – 2021-06-24 (×12): 1 via ORAL

## 2021-06-16 MED ORDER — PROSOURCE PLUS PO LIQD
30.0000 mL | Freq: Three times a day (TID) | ORAL | Status: DC
  Administered 2021-06-16 – 2021-06-24 (×19): 30 mL via ORAL
  Filled 2021-06-16 (×27): qty 30

## 2021-06-16 MED ORDER — SODIUM CHLORIDE 3 % IV SOLN
INTRAVENOUS | Status: DC
  Filled 2021-06-16 (×8): qty 500

## 2021-06-16 NOTE — Progress Notes (Signed)
1735 Report received from [facility] on 2 A.

## 2021-06-16 NOTE — Assessment & Plan Note (Signed)
-   Etiology work-up at this time - Acute on chronic, I suspect this is secondary to SIADH worsened by poor p.o. intake in the setting of thrush - Patient has history of SIADH - Epic order placed for nephrology - A.m. team to reach out to nephrology for consultation - Resumed home salt tablets 3 times daily - Encourage p.o. intake

## 2021-06-16 NOTE — Assessment & Plan Note (Signed)
Continue to have some oral discomfort with eating.  No difficulty swallowing. -Continue with nystatin swishes -Soft chopped diet

## 2021-06-16 NOTE — Plan of Care (Signed)
  Problem: Education: Goal: Knowledge of General Education information will improve Description Including pain rating scale, medication(s)/side effects and non-pharmacologic comfort measures Outcome: Progressing   Problem: Health Behavior/Discharge Planning: Goal: Ability to manage health-related needs will improve Outcome: Progressing   

## 2021-06-16 NOTE — Assessment & Plan Note (Addendum)
-   Etiology work-up in progress - Orthostatic vitals ordered - Query secondary to poor p.o. intake for about 1 month and on several antihypertensive medications - Complete echo ordered - Admit to telemetry cardiac, observation

## 2021-06-16 NOTE — Assessment & Plan Note (Signed)
Most likely multifactorial with poor p.o. intake, taking several antihypertensives and hyponatremia.  Repeat echocardiogram with normal EF and grade 1 diastolic dysfunction.  No regional wall motion abnormalities. -Encourage p.o. hydration -Keep holding antihypertensives as blood pressure within normal limit.

## 2021-06-16 NOTE — Progress Notes (Signed)
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  GOAL:   Patient will meet greater than or equal to 90% of their needs  MONITOR:   PO intake, Supplement acceptance, Labs, Weight trends, Skin, I & O's  REASON FOR ASSESSMENT:   Malnutrition Screening Tool    ASSESSMENT:   71 year old male with history of GERD, depression, anxiety, hyperlipidemia, tobacco dependence, who presents to the emergency department for chief concerns of confusion/Loss of consciousness.  Pt admitted with syncope.   Reviewed I/O's: -540 ml x 24 hours  UOP: 900 ml x 24 hours  Spoke with pt at bedside, who reports that he typically has a very good appetite, however, intake has significantly decreased over the past 2 weeks secondary to sores in his moth. Pt has been consuming mostly liquids and soft foods such as mashed potatoes. Noted pt consumed about 50% of breakfast tray- he is requesting soft diet for ease of intake, which RD ordered and entered into [employer] meal ordering system.   Pt reports wt loss, states his UBW is around 165# and suspects he has lost 20 pounds over the past 2 weeks. Reviewed wt hx; pt has experienced a 16.9% wt loss over the past year. While this is not significant for time frame, it is concerning given multiple co-morbidities and recently poor oral intake.   Discussed importance of good meal and supplement intake to promote healing. Pt does not like Ensure, but open to other options.    Medications reviewed and include decadron.   Labs reviewed: Na: 117, CBGS: 109 (inpatient orders for glycemic control are none).    NUTRITION - FOCUSED PHYSICAL EXAM:  Flowsheet Row Most Recent Value  Orbital Region Moderate depletion  Upper Arm Region Severe depletion  Thoracic and Lumbar Region Moderate depletion  Buccal Region Severe depletion  Temple Region Moderate depletion  Clavicle Bone Region Severe depletion  Clavicle and Acromion Bone Region Severe depletion  Scapular Bone Region Severe depletion  Dorsal Hand Moderate depletion  Patellar Region Moderate depletion  Anterior Thigh Region Moderate depletion  Posterior Calf Region Moderate depletion  Edema (RD Assessment) None  Hair Reviewed  Eyes Reviewed  Mouth Reviewed  Skin Reviewed  Nails Reviewed       Diet Order:   Diet Order             DIET DYS 3 Room service appropriate? Yes; Fluid consistency: Thin  Diet effective now                   EDUCATION NEEDS:   Education needs have been addressed  Skin:  Skin Assessment: Skin Integrity Issues: Skin Integrity Issues:: Stage II Stage II: coccyx  Last BM:  06/14/21  Height:   Ht Readings from Last 1 Encounters:  06/15/21 6' (1.829 m)    Weight:   Wt Readings from Last 1 Encounters:  06/16/21 70.6 kg    Ideal Body Weight:  80.9 kg  BMI:  Body mass index is 21.11 kg/m.  Estimated Nutritional Needs:   Kcal:  2100-2300  Protein:  125-140 grams  Fluid:  > 2 L    Loistine Chance, RD, LDN, [place] Registered Dietitian II Certified Diabetes Care and Education Specialist Please refer to [facility] for RD and/or RD on-call/weekend/after hours pager

## 2021-06-16 NOTE — Progress Notes (Signed)
Date and time results received: 06/16/21 0638   Test: Sodium Critical Value: 117  Name of Provider Notified: Jaci Carrel   Orders Received? Patient receiving  1 gram sodium chloride

## 2021-06-16 NOTE — Progress Notes (Signed)
PHARMACY CONSULT NOTE - FOLLOW UP  Pharmacy Consult for Electrolyte Monitoring and Replacement   Recent Labs: Potassium (mmol/L)  Date Value  06/16/2021 4.2  10/02/2013 4.4   Magnesium (mg/dL)  Date Value  06/15/2021 1.7   Calcium (mg/dL)  Date Value  06/16/2021 7.8 (L)   Calcium, Total (mg/dL)  Date Value  10/02/2013 9.1   Albumin (g/dL)  Date Value  06/16/2021 2.6 (L)  10/01/2013 3.3 (L)   Phosphorus (mg/dL)  Date Value  06/15/2021 2.9   Sodium (mmol/L)  Date Value  06/16/2021 114 (LL)  10/02/2013 136     Assessment: 71 year old male admitted with syncope. Patient found to have sodium 117, started on NS. Repeat sodium down to 114, patient transferred to the ICU for hypertonic saline. Nephrology following.  Goal of Therapy:  Increase in Na no greater than 8-10 mEq in 24 hr  Plan:  --Hypertonic saline to start at 25 ml/hr --Plan to follow Na q4h approximately --Continue to follow along with Nephrology  Tawnya Crook, PharmD, BCPS Clinical Pharmacist 06/16/2021 7:39 PM

## 2021-06-16 NOTE — Hospital Course (Signed)
71 year old male with history of GERD, depression, anxiety, hyperlipidemia, tobacco dependence, who presents to the emergency department for chief concerns of confusion/Loss of consciousness.  Initial vitals in the emergency department show temperature of 98, respiration rate of 18, heart rate of 67, blood pressure 108/65, SPO2 of 97% on 4 L baseline nasal cannula.  Labs in the emergency department showed serum sodium 117, potassium 4.4, chloride 83, bicarb 27, BUN of 16, serum creatinine of 0.45, nonfasting blood glucose 101.  BNP was 56.  Troponin was 6.  Patient had leukocytosis of 13.9, hemoglobin 9.7, platelets of 351.  TSH was 1.936.  UA was negative for leukocytes and nitrates.  In the emergency department patient was given sodium chloride 100 mL/h, for approximately 5 hours.

## 2021-06-16 NOTE — Progress Notes (Addendum)
Progress Note   Patient: Jeremiah [name] QMV:[id number] DOB: Jan 10, 1951 DOA: 06/15/2021     0 DOS: the patient was seen and examined on 06/16/2021   Brief hospital course: Taken from H&P.  71 year old male with history of GERD, depression, anxiety, hyperlipidemia, tobacco dependence, who presents to the emergency department for chief concerns of confusion/Loss of consciousness. He was hemodynamically stable on arrival, oxygen requirement remained at his baseline of 4 L. Labs pertinent for worsening hyponatremia at 117.  BNP and troponin was within normal limit. Also found to have mild leukocytosis, TSH within normal limit. Also found to have oral thrush with decreased p.o. intake secondary to oral pain for about a month.  Addendum.  Repeat sodium at 114, patient is being transferred to ICU for hypertonic saline  Assessment and Plan: * Syncope- (present on admission) Most likely multifactorial with poor p.o. intake, taking several antihypertensives and hyponatremia.  Repeat echocardiogram with normal EF and grade 1 diastolic dysfunction.  No regional wall motion abnormalities. -Encourage p.o. hydration -Keep holding antihypertensives as blood pressure within normal limit.  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 Nephrology was consulted-we will appreciate their recommendations. -Started on normal saline at 75 mL/h by nephrology. -Continue with home dose of salt tablets -Monitor sodium  Thrush, oral- (present on admission) Continue to have some oral discomfort with eating.  No difficulty swallowing. -Continue with nystatin swishes -Soft chopped diet  HTN (hypertension)- (present on admission) Blood pressure within goal.  Home antihypertensives which include diltiazem 120 mg twice daily and lisinopril 10 mg was held. -Continue holding antihypertensives -Continue with as needed hydralazine -We will resume antihypertensives as  needed  COPD, moderate ([place])- (present on admission) No concern for acute exacerbation.  On baseline oxygen requirement of 4 L -Continue supplemental oxygen -Continue home bronchodilators  Tobacco dependency- (present on admission) - Continue with nicotine patch  Unintentional weight loss- (present on admission) Patient with poor p.o. intake with oral thrush. -Will need outpatient work-up by PCP   Subjective: Patient was seen and examined today.  Continues to have some pain with swallowing, mostly in his mouth, stating it is little better than before.  Denies any difficulty swallowing.  He was requesting soft diet.  Continue to feel weaker than his baseline.  Physical Exam: Vitals:   06/15/21 2340 06/16/21 0500 06/16/21 0803 06/16/21 1140  BP: (!) 113/55 123/62 118/68 123/75  Pulse: 69 72 75 77  Resp: 19 19 19 18   Temp: 98.2 F (36.8 C) 98.2 F (36.8 C) 97.7 F (36.5 C) 97.8 F (36.6 C)  TempSrc:      SpO2: 98% 98% 99% 97%  Weight:  70.6 kg    Height:       General.     In no acute distress.  Oral thrush noted. Pulmonary.  Lungs clear bilaterally, normal respiratory effort. CV.  Regular rate and rhythm, no JVD, rub or murmur. Abdomen.  Soft, nontender, nondistended, BS positive. CNS.  Alert and oriented x3.  No focal neurologic deficit. Extremities.  No edema, no cyanosis, pulses intact and symmetrical. Psychiatry.  Judgment and insight appears normal.  Data Reviewed: Personally reviewed labs.  And prior notes.  Sodium remained at 117.  Family Communication:   Disposition: Status is: Inpatient Remains inpatient appropriate because: Severity of illness    Planned Discharge Destination: Home in next couple of days  Time spent: 45 minutes minutes  Author: Lorella Nimrod, MD 06/16/2021 3:08 PM  For on call  review [email or web address].

## 2021-06-16 NOTE — Plan of Care (Signed)

## 2021-06-16 NOTE — Progress Notes (Signed)
Date and time results received: 06/16/21 1442 (use smartphrase ".now" to insert current time)  Test: Na  Critical Value: 114  Name of Provider Notified: Dr. Reesa Chew and Milana Na   Orders Received? Or Actions Taken?: Orders Received - See Orders for details transfer to ICU   Jeremiah [name], Jeremiah Ringer, RN

## 2021-06-16 NOTE — Assessment & Plan Note (Signed)
Blood pressure within goal.  Home antihypertensives which include diltiazem 120 mg twice daily and lisinopril 10 mg was held. -Continue holding antihypertensives -Continue with as needed hydralazine -We will resume antihypertensives as needed

## 2021-06-16 NOTE — Assessment & Plan Note (Signed)
-   Likely multifactorial including and poor p.o. intake in setting of thrush - Recommend outpatient follow-up - Treat as above

## 2021-06-16 NOTE — Assessment & Plan Note (Signed)
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 Nephrology was consulted-we will appreciate their recommendations. -Started on normal saline at 75 mL/h by nephrology. -Continue with home dose of salt tablets -Monitor sodium

## 2021-06-16 NOTE — Assessment & Plan Note (Signed)
-   Resumed home inhalers

## 2021-06-16 NOTE — Consult Note (Signed)
[facility]  CONSULT NOTE    Date: 06/16/2021                  Patient Name:  Jeremiah [name]  MRN: [id number]  DOB: 20-Apr-1951  Age / Sex: 71 y.o., male         PCP: Clay                 Service Requesting Consult: Dr. Reesa Chew                 Reason for Consult: Hyponatremia            History of Present Illness: Mr. Chauncey Sciulli patient presents to [facility] with syncope. Patient states he has been feeling weak and he has been unable to eat. He has been losing weight. He states his mouth is sore. He was found to have oral thrush on examination. Patient currently getting rehab at [facility].   Patient found to have a serum sodium of 117. Given IV saline and nephrology consulted.   Repeat sodium of 114. Patient then transferred to ICU and started on hypertonic saline infusion.    Medications: Outpatient medications: Medications Prior to Admission  Medication Sig Dispense Refill Last Dose   atorvastatin (LIPITOR) 20 MG tablet Take 20 mg by mouth daily.   06/14/2021 at 2031   BREO ELLIPTA 100-25 MCG/INH AEPB Inhale 1 puff into the lungs daily. 30 each 0 06/15/2021 at 0902   dexamethasone (DECADRON) 6 MG tablet Take 6 mg by mouth daily. End date 06/16/21   06/14/2021 at 2031   diltiazem (CARDIZEM) 120 MG tablet Take 1 tablet (120 mg total) by mouth 2 (two) times daily.   06/15/2021 at 0902   DULoxetine (CYMBALTA) 30 MG capsule Take 30 mg by mouth daily.   06/15/2021 at 0902   guaiFENesin-dextromethorphan (ROBITUSSIN DM) 100-10 MG/5ML syrup Take 5 mLs by mouth every 6 (six) hours as needed for cough.   prn   ipratropium-albuterol (DUONEB) 0.5-2.5 (3) MG/3ML SOLN Take 3 mLs by nebulization every 6 (six) hours as needed. 360 mL  06/15/2021 at 1212   lisinopril (ZESTRIL) 10 MG tablet Take 10 mg by mouth daily.   06/15/2021 at 0902   montelukast (SINGULAIR) 10 MG tablet Take 1 tablet (10 mg total) by mouth at bedtime. 30 tablet 0 06/14/2021 at 2031    mupirocin ointment (BACTROBAN) 2 % Apply 1 application topically 2 (two) times daily.   06/15/2021 at 0721   nicotine (NICODERM CQ - DOSED IN MG/24 HOURS) 21 mg/24hr patch Place 1 patch (21 mg total) onto the skin daily. 28 patch 0 06/15/2021 at 1212   nystatin (MYCOSTATIN) 100000 UNIT/ML suspension Take 5 mLs by mouth 4 (four) times daily.   06/15/2021 at 1212   pantoprazole (PROTONIX) 20 MG tablet Take 1 tablet (20 mg total) by mouth daily. 30 tablet 0 06/15/2021 at 0527   sodium chloride 1 g tablet Take 1 tablet (1 g total) by mouth 3 (three) times daily with meals.   06/15/2021 at 1212   tiotropium (SPIRIVA) 18 MCG inhalation capsule Place 18 mcg into inhaler and inhale daily.   06/15/2021 at 0902   acetaminophen (TYLENOL) 325 MG tablet Take 2 tablets (650 mg total) by mouth every 6 (six) hours as needed for mild pain (or Fever >/= 101).   06/11/2021 at 2057   albuterol (VENTOLIN HFA) 108 (90 Base) MCG/ACT inhaler Inhale into the lungs every 6 (six)  hours as needed for wheezing or shortness of breath.   06/10/2021 at 0547   atorvastatin (LIPITOR) 40 MG tablet Take 40 mg by mouth every evening. (Patient not taking: Reported on 06/15/2021)   Not Taking   nicotine polacrilex (NICORETTE) 2 MG gum Take 1 each (2 mg total) by mouth as needed for smoking cessation. 100 tablet 0 prn at prn    Current medications: Current Facility-Administered Medications  Medication Dose Route Frequency Provider Last Rate Last Admin   (feeding supplement) PROSource Plus liquid 30 mL  30 mL Oral TID BM Lorella Nimrod, MD   30 mL at 06/16/21 1145   0.9 %  sodium chloride infusion   Intravenous Continuous Rashaun Curl, MD       acetaminophen (TYLENOL) tablet 650 mg  650 mg Oral Q6H PRN Cox, Amy N, DO   650 mg at 06/16/21 0919   Or   acetaminophen (TYLENOL) suppository 650 mg  650 mg Rectal Q6H PRN Cox, Amy N, DO       albuterol (PROVENTIL) (2.5 MG/3ML) 0.083% nebulizer solution 3 mL  3 mL Inhalation Q6H PRN Cox, Amy N, DO   3 mL at  06/15/21 2242   atorvastatin (LIPITOR) tablet 20 mg  20 mg Oral QHS Cox, Amy N, DO   20 mg at 06/15/21 2225   dexamethasone (DECADRON) tablet 6 mg  6 mg Oral Daily Cox, Amy N, DO   6 mg at 06/16/21 7253   DULoxetine (CYMBALTA) DR capsule 30 mg  30 mg Oral Daily Cox, Amy N, DO   30 mg at 06/16/21 0914   enoxaparin (LOVENOX) injection 40 mg  40 mg Subcutaneous Q24H Cox, Amy N, DO   40 mg at 06/15/21 2225   feeding supplement (BOOST / RESOURCE BREEZE) liquid 1 Container  1 Container Oral TID BM Lorella Nimrod, MD   1 Container at 06/16/21 1247   hydrALAZINE (APRESOLINE) injection 5 mg  5 mg Intravenous Q6H PRN Cox, Amy N, DO       ipratropium-albuterol (DUONEB) 0.5-2.5 (3) MG/3ML nebulizer solution 3 mL  3 mL Nebulization Q6H PRN Cox, Amy N, DO       multivitamin with minerals tablet 1 tablet  1 tablet Oral Daily Lorella Nimrod, MD   1 tablet at 06/16/21 1247   nicotine polacrilex (NICORETTE) gum 2 mg  2 mg Oral PRN Cox, Amy N, DO       nystatin (MYCOSTATIN) 100000 UNIT/ML suspension 500,000 Units  5 mL Mouth/Throat QID Cox, Amy N, DO   500,000 Units at 06/16/21 0914   pantoprazole (PROTONIX) EC tablet 20 mg  20 mg Oral Daily Cox, Amy N, DO   20 mg at 06/16/21 0914   sodium chloride flush (NS) 0.9 % injection 3 mL  3 mL Intravenous Q12H Cox, Amy N, DO   3 mL at 06/16/21 0915   sodium chloride tablet 1 g  1 g Oral TID WC Cox, Amy N, DO   1 g at 06/16/21 1247      Allergies: Allergies  Allergen Reactions   Penicillins Anaphylaxis, Swelling and Other (See Comments)    Tolerated Ceftriaxone 06/2020 05/2020 childhood reaction and his mother told him that he "swelled up."  Had a PCN reaction causing immediate rash, facial/tongue/throat swelling, SOB or lightheadedness with hypotension: Yes Had a PCN reaction causing severe rash involving mucus membranes or skin necrosis: No Had a PCN reaction that required hospitalization No Had a PCN reaction occurring within the last 10 years: No  If all the above  answers are "NO", may proceed with Cephalosporin use.      Past Medical History: Past Medical History:  Diagnosis Date   Asthma    COPD (chronic obstructive pulmonary disease) (HCC)    O2 dependent - 2L   GERD (gastroesophageal reflux disease)    HTN (hypertension)    Oxygen dependent    Tobacco dependence      Past Surgical History: Past Surgical History:  Procedure Laterality Date   CATARACT EXTRACTION W/PHACO Left 12/09/2019   Procedure: CATARACT EXTRACTION PHACO AND INTRAOCULAR LENS PLACEMENT (IOC) COMPLICATED LEFT 67.67 20:94.7;  Surgeon: Birder Robson, MD;  Location: [place];  Service: Ophthalmology;  Laterality: Left;  MILOOP VISION BLUE   CATARACT EXTRACTION W/PHACO Right 01/27/2020   Procedure: CATARACT EXTRACTION PHACO AND INTRAOCULAR LENS PLACEMENT ([place]) RIGHT VISION BLUE 26.83 02:07.6;  Surgeon: Birder Robson, MD;  Location: [place];  Service: Ophthalmology;  Laterality: Right;   HERNIA REPAIR     NECK SURGERY       Family History: Family History  Problem Relation Age of Onset   Cancer - Colon Father    Congestive Heart Failure Mother      Social History: Social History   Socioeconomic History   Marital status: Divorced    Spouse name: Not on file   Number of children: Not on file   Years of education: Not on file   Highest education level: Not on file  Occupational History   Not on file  Tobacco Use   Smoking status: Every Day    Packs/day: 0.50    Years: 50.00    Pack years: 25.00    Types: Cigarettes    Last attempt to quit: 07/28/2015    Years since quitting: 5.8   Smokeless tobacco: Never   Tobacco comments:    started age 55.  Vaping Use   Vaping Use: Never used  Substance and Sexual Activity   Alcohol use: Yes    Alcohol/week: 0.0 standard drinks    Comment: beers occasionally   Drug use: No   Sexual activity: Not on file  Other Topics Concern   Not on file  Social History Narrative   Lives at home  with wife.   Social Determinants of Health   Financial Resource Strain: Not on file  Food Insecurity: Not on file  Transportation Needs: Not on file  Physical Activity: Not on file  Stress: Not on file  Social Connections: Not on file  Intimate Partner Violence: Not on file     Review of Systems: Review of Systems  Unable to perform ROS: Medical condition   Vital Signs: Blood pressure 123/75, pulse 77, temperature 97.8 F (36.6 C), resp. rate 18, height 6' (1.829 m), weight 70.6 kg, SpO2 97 %.  Weight trends: Filed Weights   06/15/21 2125 06/16/21 0500  Weight: 70.6 kg 70.6 kg    Physical Exam: General: NAD,   Head: Normocephalic, atraumatic. Dry  oral mucosal membranes  Eyes: Anicteric, PERRL  Neck: Supple, trachea midline  Lungs:  Clear to auscultation  Heart: Regular rate and rhythm  Abdomen:  Soft, nontender,   Extremities:  Dry peripheral edema.  Neurologic: Nonfocal, moving all four extremities  Skin: No lesions        Lab results: Basic Metabolic Panel: Recent Labs  Lab 06/15/21 1511 06/15/21 2042 06/16/21 0530  NA 117*  --  117*  K 4.4  --  4.2  CL 83*  --  85*  CO2 27  --  27  GLUCOSE 101*  --  96  BUN 16  --  15  CREATININE 0.45*  --  0.31*  CALCIUM 8.2*  --  7.8*  MG  --  1.7  --   PHOS  --  2.9  --     Liver Function Tests: Recent Labs  Lab 06/16/21 0530  AST 20  ALT 28  ALKPHOS 106  BILITOT 0.8  PROT 5.3*  ALBUMIN 2.6*   No results for input(s): LIPASE, AMYLASE in the last 168 hours. No results for input(s): AMMONIA in the last 168 hours.  CBC: Recent Labs  Lab 06/15/21 1511 06/16/21 0530  WBC 13.9* 14.7*  HGB 9.7* 9.4*  HCT 28.7* 26.8*  MCV 80.8 78.6*  PLT 351 335    Cardiac Enzymes: No results for input(s): CKTOTAL, CKMB, CKMBINDEX, TROPONINI in the last 168 hours.  BNP: Invalid input(s): POCBNP  CBG: Recent Labs  Lab 06/16/21 0546  GLUCAP 109*    Microbiology: Results for orders placed or performed  during the hospital encounter of 06/15/21  Resp Panel by RT-PCR (Flu A&B, Covid) Nasopharyngeal Swab     Status: None   Collection Time: 06/15/21  7:16 PM   Specimen: Nasopharyngeal Swab; Nasopharyngeal(NP) swabs in vial transport medium  Result Value Ref Range Status   SARS Coronavirus 2 by RT PCR NEGATIVE NEGATIVE Final    Comment: (NOTE) SARS-CoV-2 target nucleic acids are NOT DETECTED.  The SARS-CoV-2 RNA is generally detectable in upper respiratory specimens during the acute phase of infection. The lowest concentration of SARS-CoV-2 viral copies this assay can detect is 138 copies/mL. A negative result does not preclude SARS-Cov-2 infection and should not be used as the sole basis for treatment or other patient management decisions. A negative result may occur with  improper specimen collection/handling, submission of specimen other than nasopharyngeal swab, presence of viral mutation(s) within the areas targeted by this assay, and inadequate number of viral copies(<138 copies/mL). A negative result must be combined with clinical observations, patient history, and epidemiological information. The expected result is Negative.  Fact Sheet for Patients:  EntrepreneurPulse.com.au  Fact Sheet for Healthcare Providers:  IncredibleEmployment.be  This test is no t yet approved or cleared by the [place] FDA and  has been authorized for detection and/or diagnosis of SARS-CoV-2 by FDA under an Emergency Use Authorization (EUA). This EUA will remain  in effect (meaning this test can be used) for the duration of the COVID-19 declaration under Section 564(b)(1) of the Act, 21 U.S.C.section 360bbb-3(b)(1), unless the authorization is terminated  or revoked sooner.       Influenza A by PCR NEGATIVE NEGATIVE Final   Influenza B by PCR NEGATIVE NEGATIVE Final    Comment: (NOTE) The Xpert Xpress SARS-CoV-2/FLU/RSV plus assay is intended as an  aid in the diagnosis of influenza from Nasopharyngeal swab specimens and should not be used as a sole basis for treatment. Nasal washings and aspirates are unacceptable for Xpert Xpress SARS-CoV-2/FLU/RSV testing.  Fact Sheet for Patients: EntrepreneurPulse.com.au  Fact Sheet for Healthcare Providers: IncredibleEmployment.be  This test is not yet approved or cleared by the [place] FDA and has been authorized for detection and/or diagnosis of SARS-CoV-2 by FDA under an Emergency Use Authorization (EUA). This EUA will remain in effect (meaning this test can be used) for the duration of the COVID-19 declaration under Section 564(b)(1) of the Act, 21 U.S.C. section 360bbb-3(b)(1), unless the authorization is terminated or revoked.  Performed at  [employer] [place], [place] [address]     Coagulation Studies: No results for input(s): LABPROT, INR in the last 72 hours.  Urinalysis: Recent Labs    06/15/21 1720  COLORURINE YELLOW*  LABSPEC 1.018  PHURINE 6.0  GLUCOSEU NEGATIVE  HGBUR NEGATIVE  BILIRUBINUR NEGATIVE  KETONESUR NEGATIVE  PROTEINUR NEGATIVE  NITRITE NEGATIVE  LEUKOCYTESUR NEGATIVE      Imaging: DG Chest 2 View  Result Date: 06/15/2021 CLINICAL DATA:  Shortness of breath EXAM: CHEST - 2 VIEW COMPARISON:  06/04/2021, CT chest 05/28/2021, chest x-ray 05/14/2020 FINDINGS: Advanced emphysema and diffuse bronchitic changes. No focal opacity, pleural effusion, or pneumothorax. Stable cardiomediastinal silhouette. Aortic atherosclerosis. IMPRESSION: Emphysema and chronic bronchitic changes without acute airspace disease Electronically Signed   By: Donavan Foil M.D.   On: 06/15/2021 17:06   CT HEAD WO CONTRAST (5MM)  Result Date: 06/15/2021 CLINICAL DATA:  Mental status change, unknown cause. Three episodes of loss of consciousness today. EXAM: CT HEAD WITHOUT CONTRAST TECHNIQUE: Contiguous axial  images were obtained from the base of the skull through the vertex without intravenous contrast. RADIATION DOSE REDUCTION: This exam was performed according to the departmental dose-optimization program which includes automated exposure control, adjustment of the mA and/or kV according to patient size and/or use of iterative reconstruction technique. COMPARISON:  None. FINDINGS: Brain: There is no evidence of an acute infarct, intracranial hemorrhage, mass, midline shift, or extra-axial fluid collection. The ventricles and sulci are within normal limits for age. Patchy and confluent hypodensities in the cerebral white matter bilaterally are nonspecific but compatible with extensive chronic small vessel ischemic disease. Vascular: Calcified atherosclerosis at the skull base. No hyperdense vessel. Skull: No acute fracture or suspicious osseous lesion. Sinuses/Orbits: Mild right ethmoid air cell mucosal thickening. Trace bilateral mastoid fluid. Bilateral cataract extraction. Other: None. IMPRESSION: 1. No evidence of acute intracranial abnormality. 2. Extensive chronic small vessel ischemic disease. Electronically Signed   By: Logan Bores M.D.   On: 06/15/2021 16:53   ECHOCARDIOGRAM COMPLETE  Result Date: 06/16/2021    ECHOCARDIOGRAM REPORT   Patient Name:   JEVAUN STRICK [facility] Date of Exam: 06/15/2021 Medical Rec #:  [id number]           Height:       72.0 in Accession #:    [id number]          Weight:       155.6 lb Date of Birth:  04/08/1951           BSA:          1.915 m Patient Age:    86 years            BP:           135/70 mmHg Patient Gender: M                   HR:           64 bpm. Exam Location:  [facility] Procedure: 2D Echo, Cardiac Doppler and Color Doppler Indications:     R55 Syncope  History:         Patient has prior history of Echocardiogram examinations, most                  recent 06/11/2020. COPD; Risk Factors:Hypertension and Current                  Smoker. Asthma.  Sonographer:     NaTashia  Rodgers-Jones RDCS  Referring Phys:  [id number] AMY N COX Diagnosing Phys: Ida Rogue MD IMPRESSIONS  1. Left ventricular ejection fraction, by estimation, is 55 to 60%. The left ventricle has normal function. The left ventricle has no regional wall motion abnormalities. Left ventricular diastolic parameters are consistent with Grade I diastolic dysfunction (impaired relaxation).  2. Right ventricular systolic function is normal. The right ventricular size is normal.  3. The mitral valve is normal in structure. No evidence of mitral valve regurgitation. No evidence of mitral stenosis.  4. The aortic valve is normal in structure. Aortic valve regurgitation is not visualized. No aortic stenosis is present.  5. The inferior vena cava is normal in size with greater than 50% respiratory variability, suggesting right atrial pressure of 3 mmHg. FINDINGS  Left Ventricle: Left ventricular ejection fraction, by estimation, is 55 to 60%. The left ventricle has normal function. The left ventricle has no regional wall motion abnormalities. The left ventricular internal cavity size was normal in size. There is  no left ventricular hypertrophy. Left ventricular diastolic parameters are consistent with Grade I diastolic dysfunction (impaired relaxation). Right Ventricle: The right ventricular size is normal. No increase in right ventricular wall thickness. Right ventricular systolic function is normal. Left Atrium: Left atrial size was normal in size. Right Atrium: Right atrial size was normal in size. Pericardium: There is no evidence of pericardial effusion. Mitral Valve: The mitral valve is normal in structure. Mild mitral annular calcification. No evidence of mitral valve regurgitation. No evidence of mitral valve stenosis. Tricuspid Valve: The tricuspid valve is normal in structure. Tricuspid valve regurgitation is not demonstrated. No evidence of tricuspid stenosis. Aortic Valve: The aortic valve is normal in structure.  Aortic valve regurgitation is not visualized. No aortic stenosis is present. Aortic valve mean gradient measures 4.0 mmHg. Aortic valve peak gradient measures 10.0 mmHg. Aortic valve area, by VTI measures 2.36 cm. Pulmonic Valve: The pulmonic valve was normal in structure. Pulmonic valve regurgitation is not visualized. No evidence of pulmonic stenosis. Aorta: The aortic root is normal in size and structure. Venous: The inferior vena cava is normal in size with greater than 50% respiratory variability, suggesting right atrial pressure of 3 mmHg. IAS/Shunts: No atrial level shunt detected by color flow Doppler.  LEFT VENTRICLE PLAX 2D LVIDd:         5.70 cm   Diastology LVIDs:         3.21 cm   LV e' medial:    10.00 cm/s LV PW:         0.92 cm   LV E/e' medial:  10.1 LV IVS:        0.72 cm   LV e' lateral:   9.14 cm/s LVOT diam:     2.00 cm   LV E/e' lateral: 11.1 LV SV:         67 LV SV Index:   35 LVOT Area:     3.14 cm  RIGHT VENTRICLE RV Basal diam:  4.74 cm RV S prime:     20.90 cm/s TAPSE (M-mode): 2.6 cm LEFT ATRIUM             Index        RIGHT ATRIUM           Index LA diam:        4.80 cm 2.51 cm/m   RA Area:     21.40 cm LA Vol (A2C):   61.1 ml 31.90 ml/m  RA Volume:   70.80 ml  36.97 ml/m LA Vol (A4C):   61.2 ml 31.95 ml/m LA Biplane Vol: 63.1 ml 32.95 ml/m  AORTIC VALVE AV Area (Vmax):    1.99 cm AV Area (Vmean):   2.25 cm AV Area (VTI):     2.36 cm AV Vmax:           158.00 cm/s AV Vmean:          95.600 cm/s AV VTI:            0.282 m AV Peak Grad:      10.0 mmHg AV Mean Grad:      4.0 mmHg LVOT Vmax:         99.90 cm/s LVOT Vmean:        68.450 cm/s LVOT VTI:          0.212 m LVOT/AV VTI ratio: 0.75  AORTA Ao Root diam: 4.00 cm MITRAL VALVE MV Area (PHT): 2.95 cm     SHUNTS MV Decel Time: 257 msec     Systemic VTI:  0.21 m MV E velocity: 101.00 cm/s  Systemic Diam: 2.00 cm MV A velocity: 135.00 cm/s MV E/A ratio:  0.75 Ida Rogue MD Electronically signed by Ida Rogue MD  Signature Date/Time: 06/16/2021/7:57:55 AM    Final      Assessment & Plan: Mr. Shannan Garfinkel is a 71 y.o. white male with COPD with ongoing tobacco use, GERD, hypertension, , who was admitted to [facility] on 06/15/2021 for Syncope [R55] Hyponatremia [E87.1]  Hyponatremia: hypoosmolar with hypovolemia. 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 However patient may benefit from IV hydration at this time. - Continue hypertonic saline.   Anemia: hemoglobin 9.4. consistent with iron deficiency.   Hypertension: holding home lisinopril and diltiazem.     LOS: 0 Darian Ace 2/9/20232:55 PM

## 2021-06-16 NOTE — Progress Notes (Signed)
Patient transferring to ICU 15, report called to [place]. Patent aware of the need to change his level of care.   Jocelyn Lowery, Tivis Ringer, RN

## 2021-06-16 NOTE — Assessment & Plan Note (Signed)
-   Nicotine patch resumed

## 2021-06-16 NOTE — Assessment & Plan Note (Signed)
No concern for acute exacerbation.  On baseline oxygen requirement of 4 L -Continue supplemental oxygen -Continue home bronchodilators

## 2021-06-16 NOTE — Assessment & Plan Note (Addendum)
-   Patient takes diltiazem 120 mg p.o. twice daily, lisinopril 10 mg daily - These have not been resumed as patient is currently in the low normal tensive and I suspect that he has had orthostatic hypotension - Hydralazine injection 5 mg IV every 6 hours as needed for SBP greater than 170, 4 doses ordered

## 2021-06-16 NOTE — Assessment & Plan Note (Signed)
Continue with nicotine patch. ? ?

## 2021-06-16 NOTE — Progress Notes (Signed)
Critical Sodium 114, no change since last result. Lab draw occurred prior to starting 3% NS. Will continue to monitor.

## 2021-06-16 NOTE — Assessment & Plan Note (Signed)
-  Nystatin suspension 4 times daily

## 2021-06-16 NOTE — Assessment & Plan Note (Signed)
Patient with poor p.o. intake with oral thrush. -Will need outpatient work-up by PCP

## 2021-06-17 ENCOUNTER — Encounter: Payer: Self-pay | Admitting: Internal Medicine

## 2021-06-17 LAB — SODIUM
Sodium: 116 mmol/L — CL (ref 135–145)
Sodium: 117 mmol/L — CL (ref 135–145)
Sodium: 120 mmol/L — ABNORMAL LOW (ref 135–145)
Sodium: 121 mmol/L — ABNORMAL LOW (ref 135–145)
Sodium: 120 mmol/L — ABNORMAL LOW (ref 135–145)
Sodium: 120 mmol/L — ABNORMAL LOW (ref 135–145)

## 2021-06-17 LAB — GLUCOSE, CAPILLARY: Glucose-Capillary: 100 mg/dL — ABNORMAL HIGH (ref 70–99)

## 2021-06-17 MED ORDER — NICOTINE 21 MG/24HR TD PT24
21.0000 mg | MEDICATED_PATCH | Freq: Every day | TRANSDERMAL | Status: DC
  Administered 2021-06-17 – 2021-06-24 (×8): 21 mg via TRANSDERMAL
  Filled 2021-06-17 (×8): qty 1

## 2021-06-17 MED ORDER — TRAMADOL HCL 50 MG PO TABS
50.0000 mg | ORAL_TABLET | Freq: Once | ORAL | Status: AC
  Administered 2021-06-17: 50 mg via ORAL
  Filled 2021-06-17: qty 1

## 2021-06-17 NOTE — Progress Notes (Signed)
PHARMACY CONSULT NOTE - FOLLOW UP  Pharmacy Consult for Electrolyte Monitoring and Replacement   Recent Labs: Potassium (mmol/L)  Date Value  06/16/2021 4.2  10/02/2013 4.4   Magnesium (mg/dL)  Date Value  06/15/2021 1.7   Calcium (mg/dL)  Date Value  06/16/2021 7.8 (L)   Calcium, Total (mg/dL)  Date Value  10/02/2013 9.1   Albumin (g/dL)  Date Value  06/16/2021 2.6 (L)  10/01/2013 3.3 (L)   Phosphorus (mg/dL)  Date Value  06/15/2021 2.9   Sodium (mmol/L)  Date Value  06/17/2021 121 (L)  10/02/2013 136     Assessment: 71 year old male admitted with syncope. Patient found to have sodium 117, started on NS. Repeat sodium down to 114, patient transferred to the ICU for hypertonic saline. Nephrology following.  Date/Time/Level 2/9 1923 Na 114 2/9 2224 Na 115 2/10 0304 Na 116 2/10 0711 Na 117 2/10 1050 Na 120 2/10 1446 Na 121  Goal of Therapy:  Increase in Na no greater than 8-10 mEq in 24 hr  Plan:  Trending appropriately, slow rate of correction (see above) --Continue on Hypertonic saline at 25 ml/hr (2/9 PM >> --Plan to follow Na q4h approximately --Continue to follow along with Nephrology  Pearla Dubonnet, PharmD Clinical Pharmacist 06/17/2021 4:58 PM

## 2021-06-17 NOTE — Progress Notes (Signed)
Critical Sodium 115, last one 114. NP aware. No changes.

## 2021-06-17 NOTE — Progress Notes (Signed)
[facility]  ROUNDING NOTE   Subjective:   Transferred to ICU. Placed on hypertonic saline. Patient is eating well this morning. Mental status has improved.   Na 117  Objective:  Vital signs in last 24 hours:  Temp:  [97.7 F (36.5 C)-99.3 F (37.4 C)] 98.8 F (37.1 C) (02/10 0000) Pulse Rate:  [73-94] 74 (02/10 0600) Resp:  [13-20] 15 (02/10 0600) BP: (100-137)/(57-82) 130/79 (02/10 0600) SpO2:  [89 %-100 %] 95 % (02/10 0600) Weight:  [73.9 kg-75.1 kg] 75.1 kg (02/10 0409)  Weight change: 3.28 kg Filed Weights   06/16/21 0500 06/16/21 1800 06/17/21 0409  Weight: 70.6 kg 73.9 kg 75.1 kg    Intake/Output: I/O last 3 completed shifts: In: 2035.1 [P.O.:1554; I.V.:481.1] Out: 1700 [Urine:1700]   Intake/Output this shift:  No intake/output data recorded.  Physical Exam: General: NAD, sitting up in bed  Head: Normocephalic, atraumatic. Moist oral mucosal membranes  Eyes: Anicteric, PERRL  Neck: Supple, trachea midline  Lungs:  Clear to auscultation  Heart: Regular rate and rhythm  Abdomen:  Soft, nontender,   Extremities:  no peripheral edema.  Neurologic: Nonfocal, moving all four extremities  Skin: No lesions  Access: none    Basic Metabolic Panel: Recent Labs  Lab 06/15/21 1511 06/15/21 2042 06/16/21 0530 06/16/21 1441 06/16/21 1923 06/16/21 2224 06/17/21 0304 06/17/21 0711  NA 117*  --  117* 114* 114* 115* 116* 117*  K 4.4  --  4.2  --   --   --   --   --   CL 83*  --  85*  --   --   --   --   --   CO2 27  --  27  --   --   --   --   --   GLUCOSE 101*  --  96  --   --   --   --   --   BUN 16  --  15  --   --   --   --   --   CREATININE 0.45*  --  0.31*  --   --   --   --   --   CALCIUM 8.2*  --  7.8*  --   --   --   --   --   MG  --  1.7  --   --   --   --   --   --   PHOS  --  2.9  --   --   --   --   --   --     Liver Function Tests: Recent Labs  Lab 06/16/21 0530  AST 20  ALT 28  ALKPHOS 106  BILITOT 0.8  PROT 5.3*   ALBUMIN 2.6*   No results for input(s): LIPASE, AMYLASE in the last 168 hours. No results for input(s): AMMONIA in the last 168 hours.  CBC: Recent Labs  Lab 06/15/21 1511 06/16/21 0530  WBC 13.9* 14.7*  HGB 9.7* 9.4*  HCT 28.7* 26.8*  MCV 80.8 78.6*  PLT 351 335    Cardiac Enzymes: No results for input(s): CKTOTAL, CKMB, CKMBINDEX, TROPONINI in the last 168 hours.  BNP: Invalid input(s): POCBNP  CBG: Recent Labs  Lab 06/16/21 0546 06/16/21 1813 06/17/21 0418  GLUCAP 109* 113* 100*    Microbiology: Results for orders placed or performed during the hospital encounter of 06/15/21  Resp Panel by RT-PCR (Flu A&B, Covid) Nasopharyngeal Swab  Status: None   Collection Time: 06/15/21  7:16 PM   Specimen: Nasopharyngeal Swab; Nasopharyngeal(NP) swabs in vial transport medium  Result Value Ref Range Status   SARS Coronavirus 2 by RT PCR NEGATIVE NEGATIVE Final    Comment: (NOTE) SARS-CoV-2 target nucleic acids are NOT DETECTED.  The SARS-CoV-2 RNA is generally detectable in upper respiratory specimens during the acute phase of infection. The lowest concentration of SARS-CoV-2 viral copies this assay can detect is 138 copies/mL. A negative result does not preclude SARS-Cov-2 infection and should not be used as the sole basis for treatment or other patient management decisions. A negative result may occur with  improper specimen collection/handling, submission of specimen other than nasopharyngeal swab, presence of viral mutation(s) within the areas targeted by this assay, and inadequate number of viral copies(<138 copies/mL). A negative result must be combined with clinical observations, patient history, and epidemiological information. The expected result is Negative.  Fact Sheet for Patients:  EntrepreneurPulse.com.au  Fact Sheet for Healthcare Providers:  IncredibleEmployment.be  This test is no t yet approved or cleared by  the [place] FDA and  has been authorized for detection and/or diagnosis of SARS-CoV-2 by FDA under an Emergency Use Authorization (EUA). This EUA will remain  in effect (meaning this test can be used) for the duration of the COVID-19 declaration under Section 564(b)(1) of the Act, 21 U.S.C.section 360bbb-3(b)(1), unless the authorization is terminated  or revoked sooner.       Influenza A by PCR NEGATIVE NEGATIVE Final   Influenza B by PCR NEGATIVE NEGATIVE Final    Comment: (NOTE) The Xpert Xpress SARS-CoV-2/FLU/RSV plus assay is intended as an aid in the diagnosis of influenza from Nasopharyngeal swab specimens and should not be used as a sole basis for treatment. Nasal washings and aspirates are unacceptable for Xpert Xpress SARS-CoV-2/FLU/RSV testing.  Fact Sheet for Patients: EntrepreneurPulse.com.au  Fact Sheet for Healthcare Providers: IncredibleEmployment.be  This test is not yet approved or cleared by the [place] FDA and has been authorized for detection and/or diagnosis of SARS-CoV-2 by FDA under an Emergency Use Authorization (EUA). This EUA will remain in effect (meaning this test can be used) for the duration of the COVID-19 declaration under Section 564(b)(1) of the Act, 21 U.S.C. section 360bbb-3(b)(1), unless the authorization is terminated or revoked.  Performed at [facility], [address] [place], [place] [address]   MRSA Next Gen by PCR, Nasal     Status: Abnormal   Collection Time: 06/16/21  6:05 PM   Specimen: Nasal Mucosa; Nasal Swab  Result Value Ref Range Status   MRSA by PCR Next Gen DETECTED (A) NOT DETECTED Final    Comment: RESULT CALLED TO, READ BACK BY AND VERIFIED WITH: MEGAN MCFURR AT 1920 ON 06/16/21 BY SS (NOTE) The GeneXpert MRSA Assay (FDA approved for NASAL specimens only), is one component of a comprehensive MRSA colonization surveillance program. It is not intended to  diagnose MRSA infection nor to guide or monitor treatment for MRSA infections. Test performance is not FDA approved in patients less than 62 years old. Performed at [facility], [address] [place], [place] [address]     Coagulation Studies: No results for input(s): LABPROT, INR in the last 72 hours.  Urinalysis: Recent Labs    06/15/21 1720  COLORURINE YELLOW*  LABSPEC 1.018  PHURINE 6.0  GLUCOSEU NEGATIVE  HGBUR NEGATIVE  BILIRUBINUR NEGATIVE  KETONESUR NEGATIVE  PROTEINUR NEGATIVE  NITRITE NEGATIVE  LEUKOCYTESUR NEGATIVE  Imaging: DG Chest 2 View  Result Date: 06/15/2021 CLINICAL DATA:  Shortness of breath EXAM: CHEST - 2 VIEW COMPARISON:  06/04/2021, CT chest 05/28/2021, chest x-ray 05/14/2020 FINDINGS: Advanced emphysema and diffuse bronchitic changes. No focal opacity, pleural effusion, or pneumothorax. Stable cardiomediastinal silhouette. Aortic atherosclerosis. IMPRESSION: Emphysema and chronic bronchitic changes without acute airspace disease Electronically Signed   By: Donavan Foil M.D.   On: 06/15/2021 17:06   CT HEAD WO CONTRAST (5MM)  Result Date: 06/15/2021 CLINICAL DATA:  Mental status change, unknown cause. Three episodes of loss of consciousness today. EXAM: CT HEAD WITHOUT CONTRAST TECHNIQUE: Contiguous axial images were obtained from the base of the skull through the vertex without intravenous contrast. RADIATION DOSE REDUCTION: This exam was performed according to the departmental dose-optimization program which includes automated exposure control, adjustment of the mA and/or kV according to patient size and/or use of iterative reconstruction technique. COMPARISON:  None. FINDINGS: Brain: There is no evidence of an acute infarct, intracranial hemorrhage, mass, midline shift, or extra-axial fluid collection. The ventricles and sulci are within normal limits for age. Patchy and confluent hypodensities in the cerebral white matter bilaterally are  nonspecific but compatible with extensive chronic small vessel ischemic disease. Vascular: Calcified atherosclerosis at the skull base. No hyperdense vessel. Skull: No acute fracture or suspicious osseous lesion. Sinuses/Orbits: Mild right ethmoid air cell mucosal thickening. Trace bilateral mastoid fluid. Bilateral cataract extraction. Other: None. IMPRESSION: 1. No evidence of acute intracranial abnormality. 2. Extensive chronic small vessel ischemic disease. Electronically Signed   By: Logan Bores M.D.   On: 06/15/2021 16:53   ECHOCARDIOGRAM COMPLETE  Result Date: 06/16/2021    ECHOCARDIOGRAM REPORT   Patient Name:   Jeremiah [name] [facility] Date of Exam: 06/15/2021 Medical Rec #:  [id number]           Height:       72.0 in Accession #:    [id number]          Weight:       155.6 lb Date of Birth:  1950/06/11           BSA:          1.915 m Patient Age:    44 years            BP:           135/70 mmHg Patient Gender: M                   HR:           64 bpm. Exam Location:  [facility] Procedure: 2D Echo, Cardiac Doppler and Color Doppler Indications:     R55 Syncope  History:         Patient has prior history of Echocardiogram examinations, most                  recent 06/11/2020. COPD; Risk Factors:Hypertension and Current                  Smoker. Asthma.  Sonographer:     Cresenciano Lick RDCS Referring Phys:  [id number] AMY N COX Diagnosing Phys: Ida Rogue MD IMPRESSIONS  1. Left ventricular ejection fraction, by estimation, is 55 to 60%. The left ventricle has normal function. The left ventricle has no regional wall motion abnormalities. Left ventricular diastolic parameters are consistent with Grade I diastolic dysfunction (impaired relaxation).  2. Right ventricular systolic function is normal. The right ventricular size is normal.  3. The mitral valve  is normal in structure. No evidence of mitral valve regurgitation. No evidence of mitral stenosis.  4. The aortic valve is normal in structure. Aortic valve  regurgitation is not visualized. No aortic stenosis is present.  5. The inferior vena cava is normal in size with greater than 50% respiratory variability, suggesting right atrial pressure of 3 mmHg. FINDINGS  Left Ventricle: Left ventricular ejection fraction, by estimation, is 55 to 60%. The left ventricle has normal function. The left ventricle has no regional wall motion abnormalities. The left ventricular internal cavity size was normal in size. There is  no left ventricular hypertrophy. Left ventricular diastolic parameters are consistent with Grade I diastolic dysfunction (impaired relaxation). Right Ventricle: The right ventricular size is normal. No increase in right ventricular wall thickness. Right ventricular systolic function is normal. Left Atrium: Left atrial size was normal in size. Right Atrium: Right atrial size was normal in size. Pericardium: There is no evidence of pericardial effusion. Mitral Valve: The mitral valve is normal in structure. Mild mitral annular calcification. No evidence of mitral valve regurgitation. No evidence of mitral valve stenosis. Tricuspid Valve: The tricuspid valve is normal in structure. Tricuspid valve regurgitation is not demonstrated. No evidence of tricuspid stenosis. Aortic Valve: The aortic valve is normal in structure. Aortic valve regurgitation is not visualized. No aortic stenosis is present. Aortic valve mean gradient measures 4.0 mmHg. Aortic valve peak gradient measures 10.0 mmHg. Aortic valve area, by VTI measures 2.36 cm. Pulmonic Valve: The pulmonic valve was normal in structure. Pulmonic valve regurgitation is not visualized. No evidence of pulmonic stenosis. Aorta: The aortic root is normal in size and structure. Venous: The inferior vena cava is normal in size with greater than 50% respiratory variability, suggesting right atrial pressure of 3 mmHg. IAS/Shunts: No atrial level shunt detected by color flow Doppler.  LEFT VENTRICLE PLAX 2D LVIDd:          5.70 cm   Diastology LVIDs:         3.21 cm   LV e' medial:    10.00 cm/s LV PW:         0.92 cm   LV E/e' medial:  10.1 LV IVS:        0.72 cm   LV e' lateral:   9.14 cm/s LVOT diam:     2.00 cm   LV E/e' lateral: 11.1 LV SV:         67 LV SV Index:   35 LVOT Area:     3.14 cm  RIGHT VENTRICLE RV Basal diam:  4.74 cm RV S prime:     20.90 cm/s TAPSE (M-mode): 2.6 cm LEFT ATRIUM             Index        RIGHT ATRIUM           Index LA diam:        4.80 cm 2.51 cm/m   RA Area:     21.40 cm LA Vol (A2C):   61.1 ml 31.90 ml/m  RA Volume:   70.80 ml  36.97 ml/m LA Vol (A4C):   61.2 ml 31.95 ml/m LA Biplane Vol: 63.1 ml 32.95 ml/m  AORTIC VALVE AV Area (Vmax):    1.99 cm AV Area (Vmean):   2.25 cm AV Area (VTI):     2.36 cm AV Vmax:           158.00 cm/s AV Vmean:  95.600 cm/s AV VTI:            0.282 m AV Peak Grad:      10.0 mmHg AV Mean Grad:      4.0 mmHg LVOT Vmax:         99.90 cm/s LVOT Vmean:        68.450 cm/s LVOT VTI:          0.212 m LVOT/AV VTI ratio: 0.75  AORTA Ao Root diam: 4.00 cm MITRAL VALVE MV Area (PHT): 2.95 cm     SHUNTS MV Decel Time: 257 msec     Systemic VTI:  0.21 m MV E velocity: 101.00 cm/s  Systemic Diam: 2.00 cm MV A velocity: 135.00 cm/s MV E/A ratio:  0.75 Ida Rogue MD Electronically signed by Ida Rogue MD Signature Date/Time: 06/16/2021/7:57:55 AM    Final      Medications:    sodium chloride (hypertonic) 25 mL/hr at 06/17/21 0600    (feeding supplement) PROSource Plus  30 mL Oral TID BM   atorvastatin  20 mg Oral QHS   Chlorhexidine Gluconate Cloth  6 each Topical Q0600   dexamethasone  6 mg Oral Daily   DULoxetine  30 mg Oral Daily   enoxaparin (LOVENOX) injection  40 mg Subcutaneous Q24H   feeding supplement  1 Container Oral TID BM   multivitamin with minerals  1 tablet Oral Daily   nystatin  5 mL Mouth/Throat QID   pantoprazole  20 mg Oral Daily   sodium chloride flush  3 mL Intravenous Q12H   acetaminophen **OR** acetaminophen,  albuterol, hydrALAZINE, ipratropium-albuterol, nicotine polacrilex  Assessment/ Plan:  [name] is a 71 y.o.  male [name] is a 71 y.o. white male with COPD with ongoing tobacco use, GERD, hypertension, , who was admitted to [facility] on 06/15/2021 for Syncope [R55] Hyponatremia [E87.1]   Hyponatremia: hypoosmolar with hypovolemia. 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 However patient may benefit from IV hydration at this time. - Continue hypertonic saline per algorithm   Anemia: hemoglobin 9.4. consistent with iron deficiency.   Hypertension: 130/79. holding home lisinopril and diltiazem.   LOS: 1 Jeremiah [name] 2/10/20239:48 AM

## 2021-06-17 NOTE — Assessment & Plan Note (Signed)
Most likely multifactorial with poor p.o. intake, taking several antihypertensives and hyponatremia.  Repeat echocardiogram with normal EF and grade 1 diastolic dysfunction.  No regional wall motion abnormalities. -Encourage p.o. hydration -Keep holding antihypertensives as blood pressure within normal limit.

## 2021-06-17 NOTE — Assessment & Plan Note (Signed)
Estimated body mass index is 22.45 kg/m as calculated from the following:   Height as of this encounter: 6' (1.829 m).   Weight as of this encounter: 75.1 kg.  -Dietitian consult

## 2021-06-17 NOTE — Progress Notes (Signed)
Progress Note   Patient: Jeremiah [name] TJQ:[id number] DOB: [birth year] DOA: 06/15/2021     1 DOS: the patient was seen and examined on 06/17/2021   Brief hospital course: Taken from H&P.  71 year old male with history of GERD, depression, anxiety, hyperlipidemia, tobacco dependence, who presents to the emergency department for chief concerns of confusion/Loss of consciousness. He was hemodynamically stable on arrival, oxygen requirement remained at his baseline of 4 L. Labs pertinent for worsening hyponatremia at 117.  BNP and troponin was within normal limit. Also found to have mild leukocytosis, TSH within normal limit. Also found to have oral thrush with decreased p.o. intake secondary to oral pain for about a month.  Patient was later transferred to ICU for hypertonic saline due to worsening sodium at 114. Slowly improving with hypertonic saline at 25 mL/h-nephrology is on board.  Subjective: Patient was complaining of some dizziness and still feeling weak.  Tongue burns with eating.  No swallowing difficulty or pain.  Assessment and Plan: * Syncope- (present on admission) Most likely multifactorial with poor p.o. intake, taking several antihypertensives and hyponatremia.  Repeat echocardiogram with normal EF and grade 1 diastolic dysfunction.  No regional wall motion abnormalities. -Encourage p.o. hydration -Keep holding antihypertensives as blood pressure within normal limit.  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  Current labs consistent with high poor osmolar, hypovolemic. Nephrology was consulted-we will appreciate their recommendations. Patient was transferred to ICU for hypertonic saline. Sodium started improving appropriately, mostly recent at 121 -Continue with hypertonic saline -Monitor sodium  Thrush, oral- (present on admission) Continue to have some oral discomfort with eating.  No difficulty  swallowing. -Continue with nystatin swishes -Soft chopped diet  HTN (hypertension)- (present on admission) Blood pressure within goal.  Home antihypertensives which include diltiazem 120 mg twice daily and lisinopril 10 mg was held. -Continue holding antihypertensives -Continue with as needed hydralazine -We will resume antihypertensives as needed  COPD, moderate ([place])- (present on admission) No concern for acute exacerbation.  On baseline oxygen requirement of 4 L -Continue supplemental oxygen -Continue home bronchodilators  Tobacco dependency- (present on admission) - Continue with nicotine patch  Unintentional weight loss- (present on admission) Patient with poor p.o. intake with oral thrush. -Will need outpatient work-up by PCP  Protein-calorie malnutrition, severe Estimated body mass index is 22.45 kg/m as calculated from the following:   Height as of this encounter: 6' (1.829 m).   Weight as of this encounter: 75.1 kg.  -Dietitian consult    Physical Exam: Vitals:   06/17/21 1200 06/17/21 1300 06/17/21 1500 06/17/21 1600  BP: 123/72 120/71 119/69 138/74  Pulse: 83 78 84 97  Resp: 18 16 18 13   Temp: 98.3 F (36.8 C)   98.6 F (37 C)  TempSrc: Oral   Oral  SpO2: 94% 96% 95% 95%  Weight:      Height:       General.   In no acute distress.  Bright red tongue, thrush seems improving Pulmonary.  Lungs clear bilaterally, normal respiratory effort. CV.  Regular rate and rhythm, no JVD, rub or murmur. Abdomen.  Soft, nontender, nondistended, BS positive. CNS.  Alert and oriented .  No focal neurologic deficit. Extremities.  No edema, no cyanosis, pulses intact and symmetrical. Psychiatry.  Judgment and insight appears normal.  Data Reviewed: Labs reviewed.  Family Communication:   Disposition: Status is: Inpatient Remains inpatient appropriate because: Severity of illness    Planned Discharge Destination: Back  to same environment  DVT  prophylaxis-Lovenox Time spent: 45 minutes  Author: Lorella Nimrod, MD 06/17/2021 4:49 PM  For on call review [email or web address].

## 2021-06-17 NOTE — Progress Notes (Signed)
PHARMACY CONSULT NOTE - FOLLOW UP  Pharmacy Consult for Electrolyte Monitoring and Replacement   Recent Labs: Potassium (mmol/L)  Date Value  06/16/2021 4.2  10/02/2013 4.4   Magnesium (mg/dL)  Date Value  06/15/2021 1.7   Calcium (mg/dL)  Date Value  06/16/2021 7.8 (L)   Calcium, Total (mg/dL)  Date Value  10/02/2013 9.1   Albumin (g/dL)  Date Value  06/16/2021 2.6 (L)  10/01/2013 3.3 (L)   Phosphorus (mg/dL)  Date Value  06/15/2021 2.9   Sodium (mmol/L)  Date Value  06/17/2021 120 (L)  10/02/2013 136     Assessment: 71 year old male admitted with syncope. Patient found to have sodium 117, started on NS. Repeat sodium down to 114, patient transferred to the ICU for hypertonic saline. Nephrology following.  Date/Time/Level 2/9 1923 Na 114 2/9 2224 Na 115 2/10 0304 Na 116 2/10 0711 Na 117 2/10 1050 Na 120  Goal of Therapy:  Increase in Na no greater than 8-10 mEq in 24 hr  Plan:  Trending appropriately, slow rate of correction (see above) --Continues on Hypertonic saline at 25 ml/hr (2/9 PM >> --Plan to follow Na q4h approximately --Continue to follow along with Nephrology  Lorna Dibble, PharmD, [facility] Clinical Pharmacist 06/17/2021 1:34 PM

## 2021-06-17 NOTE — Hospital Course (Addendum)
Taken from H&P.  71 year old male with history of GERD, depression, anxiety, hyperlipidemia, tobacco dependence, who presents to the emergency department for chief concerns of confusion/Loss of consciousness. He was hemodynamically stable on arrival, oxygen requirement remained at his baseline of 4 L. Labs pertinent for worsening hyponatremia at 117.  BNP and troponin was within normal limit. Also found to have mild leukocytosis, TSH within normal limit. Also found to have oral thrush with decreased p.o. intake secondary to oral pain for about a month.  Patient was later transferred to ICU for hypertonic saline due to worsening sodium at 114. Slowly improving with hypertonic saline at 30 mL/h-nephrology is on board.  2/12: Sodium improved to 127.  Hypertonic saline was discontinued and he was restarted on his salt tablets.  PT/OT ordered for discharge planning.  2/13: Sodium continue to improve, at 129 today.  PT/OT is recommending SNF  2/14: Patient does not have any rehab days left, unable to pay out-of-pocket.  He agrees to go home with home health services but need permission from a friend called [name] as he lives with her.  Unable to reach her by me and by TOC even after multiple attempts. Medically stable for discharge

## 2021-06-17 NOTE — Assessment & Plan Note (Signed)
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  Current labs consistent with high poor osmolar, hypovolemic. Nephrology was consulted-we will appreciate their recommendations. Patient was transferred to ICU for hypertonic saline. Sodium started improving appropriately, mostly recent at 121 -Continue with hypertonic saline -Monitor sodium

## 2021-06-17 NOTE — Progress Notes (Incomplete)
PHARMACY CONSULT NOTE - FOLLOW UP  Pharmacy Consult for Electrolyte Monitoring and Replacement   Recent Labs: Potassium (mmol/L)  Date Value  06/16/2021 4.2  10/02/2013 4.4   Magnesium (mg/dL)  Date Value  06/15/2021 1.7   Calcium (mg/dL)  Date Value  06/16/2021 7.8 (L)   Calcium, Total (mg/dL)  Date Value  10/02/2013 9.1   Albumin (g/dL)  Date Value  06/16/2021 2.6 (L)  10/01/2013 3.3 (L)   Phosphorus (mg/dL)  Date Value  06/15/2021 2.9   Sodium (mmol/L)  Date Value  06/17/2021 120 (L)  10/02/2013 136     Assessment: 71 year old male admitted with syncope. Patient found to have sodium 117, started on NS. Repeat sodium down to 114, patient transferred to the ICU for hypertonic saline. Nephrology following.  Date/Time/Level 2/9 1923 Na 114 2/9 2224 Na 115 2/10 0304 Na 116 2/10 0711 Na 117 2/10 1050 Na 120  Goal of Therapy:  Increase in Na no greater than 8-10 mEq in 24 hr  Plan:  Trending appropriately, slow rate of correction (see above) --Continues on Hypertonic saline to at 25 ml/hr (2/9 PM >> --Plan to follow Na q4h approximately --Continue to follow along with Nephrology  Owens Loffler 06/17/2021 11:39 AM

## 2021-06-17 NOTE — Progress Notes (Signed)
PHARMACY CONSULT NOTE - FOLLOW UP  Pharmacy Consult for Electrolyte Monitoring and Replacement   Recent Labs: Potassium (mmol/L)  Date Value  06/16/2021 4.2  10/02/2013 4.4   Magnesium (mg/dL)  Date Value  06/15/2021 1.7   Calcium (mg/dL)  Date Value  06/16/2021 7.8 (L)   Calcium, Total (mg/dL)  Date Value  10/02/2013 9.1   Albumin (g/dL)  Date Value  06/16/2021 2.6 (L)  10/01/2013 3.3 (L)   Phosphorus (mg/dL)  Date Value  06/15/2021 2.9   Sodium (mmol/L)  Date Value  06/17/2021 117 (LL)  10/02/2013 136     Assessment: 71 year old male admitted with syncope. Patient found to have sodium 117, started on NS. Repeat sodium down to 114, patient transferred to the ICU for hypertonic saline. Nephrology following.  Date/Time/Level 2/9 1923 Na 114 2/9 2224 Na 115 2/10 0304 Na 116 2/10 0711 Na 117  Goal of Therapy:  Increase in Na no greater than 8-10 mEq in 24 hr  Plan:  Trending appropriately, slow rate of correction (see above) --Continues on Hypertonic saline to at 25 ml/hr (2/9 PM >> --Plan to follow Na q4h approximately --Continue to follow along with Nephrology  Lorna Dibble, PharmD, [facility] Clinical Pharmacist 06/17/2021 8:02 AM

## 2021-06-18 LAB — CBC
HCT: 28.6 % — ABNORMAL LOW (ref 39.0–52.0)
Hemoglobin: 9.8 g/dL — ABNORMAL LOW (ref 13.0–17.0)
MCH: 28.2 pg (ref 26.0–34.0)
MCHC: 34.3 g/dL (ref 30.0–36.0)
MCV: 82.2 fL (ref 80.0–100.0)
Platelets: 305 10*3/uL (ref 150–400)
RBC: 3.48 MIL/uL — ABNORMAL LOW (ref 4.22–5.81)
RDW: 15.5 % (ref 11.5–15.5)
WBC: 15.4 10*3/uL — ABNORMAL HIGH (ref 4.0–10.5)
nRBC: 0 % (ref 0.0–0.2)

## 2021-06-18 LAB — BASIC METABOLIC PANEL
Anion gap: 3 — ABNORMAL LOW (ref 5–15)
BUN: 19 mg/dL (ref 8–23)
CO2: 27 mmol/L (ref 22–32)
Calcium: 7.6 mg/dL — ABNORMAL LOW (ref 8.9–10.3)
Chloride: 90 mmol/L — ABNORMAL LOW (ref 98–111)
Creatinine, Ser: <0.3 mg/dL — ABNORMAL LOW (ref 0.61–1.24)
Glucose, Bld: 109 mg/dL — ABNORMAL HIGH (ref 70–99)
Potassium: 3.8 mmol/L (ref 3.5–5.1)
Sodium: 120 mmol/L — ABNORMAL LOW (ref 135–145)

## 2021-06-18 LAB — SODIUM
Sodium: 122 mmol/L — ABNORMAL LOW (ref 135–145)
Sodium: 123 mmol/L — ABNORMAL LOW (ref 135–145)
Sodium: 123 mmol/L — ABNORMAL LOW (ref 135–145)
Sodium: 124 mmol/L — ABNORMAL LOW (ref 135–145)
Sodium: 124 mmol/L — ABNORMAL LOW (ref 135–145)

## 2021-06-18 MED ORDER — TRAMADOL HCL 50 MG PO TABS
50.0000 mg | ORAL_TABLET | Freq: Once | ORAL | Status: AC
  Administered 2021-06-18: 50 mg via ORAL
  Filled 2021-06-18: qty 1

## 2021-06-18 NOTE — Assessment & Plan Note (Signed)
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  Current labs consistent with high poor osmolar, hypovolemic. Nephrology was consulted-we will appreciate their recommendations. Patient was transferred to ICU for hypertonic saline. Sodium started improving appropriately, mostly recent at 120 -Continue with hypertonic saline-dose was increased to 30 mL/h by nephrology -Monitor sodium

## 2021-06-18 NOTE — Progress Notes (Signed)
[facility]  ROUNDING NOTE   Subjective:   Transferred to ICU. Placed on hypertonic saline. Patient is eating well this morning. Mental status has improved.   Na 120  3% hypertonic at 68mL/hr  Patient eating better.   Objective:  Vital signs in last 24 hours:  Temp:  [98 F (36.7 C)-98.6 F (37 C)] 98.4 F (36.9 C) (02/11 0800) Pulse Rate:  [76-103] 86 (02/11 0900) Resp:  [9-22] 16 (02/11 0900) BP: (94-138)/(60-82) 108/62 (02/11 0900) SpO2:  [89 %-99 %] 95 % (02/11 0900)  Weight change:  Filed Weights   06/16/21 0500 06/16/21 1800 06/17/21 0409  Weight: 70.6 kg 73.9 kg 75.1 kg    Intake/Output: I/O last 3 completed shifts: In: 2406.2 [P.O.:1560; I.V.:846.2] Out: 1650 [Urine:1650]   Intake/Output this shift:  Total I/O In: 315 [P.O.:240; I.V.:75] Out: 450 [Urine:450]  Physical Exam: General: NAD, sitting up in bed  Head: Normocephalic, atraumatic. Moist oral mucosal membranes, No thrush  Eyes: Anicteric, PERRL  Neck: Supple, trachea midline  Lungs:  Clear to auscultation  Heart: Regular rate and rhythm  Abdomen:  Soft, nontender,   Extremities: no peripheral edema.  Neurologic: Nonfocal, moving all four extremities  Skin: No lesions  Access: none    Basic Metabolic Panel: Recent Labs  Lab 06/15/21 1511 06/15/21 2042 06/16/21 0530 06/16/21 1441 06/17/21 1446 06/17/21 1903 06/17/21 2058 06/18/21 0118 06/18/21 0507  NA 117*  --  117*   < > 121* 120* 120* 122* 120*  K 4.4  --  4.2  --   --   --   --   --  3.8  CL 83*  --  85*  --   --   --   --   --  90*  CO2 27  --  27  --   --   --   --   --  27  GLUCOSE 101*  --  96  --   --   --   --   --  109*  BUN 16  --  15  --   --   --   --   --  19  CREATININE 0.45*  --  0.31*  --   --   --   --   --  <0.30*  CALCIUM 8.2*  --  7.8*  --   --   --   --   --  7.6*  MG  --  1.7  --   --   --   --   --   --   --   PHOS  --  2.9  --   --   --   --   --   --   --    < > = values in this interval  not displayed.     Liver Function Tests: Recent Labs  Lab 06/16/21 0530  AST 20  ALT 28  ALKPHOS 106  BILITOT 0.8  PROT 5.3*  ALBUMIN 2.6*    No results for input(s): LIPASE, AMYLASE in the last 168 hours. No results for input(s): AMMONIA in the last 168 hours.  CBC: Recent Labs  Lab 06/15/21 1511 06/16/21 0530 06/18/21 0507  WBC 13.9* 14.7* 15.4*  HGB 9.7* 9.4* 9.8*  HCT 28.7* 26.8* 28.6*  MCV 80.8 78.6* 82.2  PLT 351 335 305     Cardiac Enzymes: No results for input(s): CKTOTAL, CKMB, CKMBINDEX, TROPONINI in the last 168 hours.  BNP: Invalid input(s): POCBNP  CBG: Recent Labs  Lab 06/16/21 0546 06/16/21 1813 06/17/21 0418  GLUCAP 109* 113* 100*     Microbiology: Results for orders placed or performed during the hospital encounter of 06/15/21  Resp Panel by RT-PCR (Flu A&B, Covid) Nasopharyngeal Swab     Status: None   Collection Time: 06/15/21  7:16 PM   Specimen: Nasopharyngeal Swab; Nasopharyngeal(NP) swabs in vial transport medium  Result Value Ref Range Status   SARS Coronavirus 2 by RT PCR NEGATIVE NEGATIVE Final    Comment: (NOTE) SARS-CoV-2 target nucleic acids are NOT DETECTED.  The SARS-CoV-2 RNA is generally detectable in upper respiratory specimens during the acute phase of infection. The lowest concentration of SARS-CoV-2 viral copies this assay can detect is 138 copies/mL. A negative result does not preclude SARS-Cov-2 infection and should not be used as the sole basis for treatment or other patient management decisions. A negative result may occur with  improper specimen collection/handling, submission of specimen other than nasopharyngeal swab, presence of viral mutation(s) within the areas targeted by this assay, and inadequate number of viral copies(<138 copies/mL). A negative result must be combined with clinical observations, patient history, and epidemiological information. The expected result is Negative.  Fact Sheet for  Patients:  EntrepreneurPulse.com.au  Fact Sheet for Healthcare Providers:  IncredibleEmployment.be  This test is no t yet approved or cleared by the [place] FDA and  has been authorized for detection and/or diagnosis of SARS-CoV-2 by FDA under an Emergency Use Authorization (EUA). This EUA will remain  in effect (meaning this test can be used) for the duration of the COVID-19 declaration under Section 564(b)(1) of the Act, 21 U.S.C.section 360bbb-3(b)(1), unless the authorization is terminated  or revoked sooner.       Influenza A by PCR NEGATIVE NEGATIVE Final   Influenza B by PCR NEGATIVE NEGATIVE Final    Comment: (NOTE) The Xpert Xpress SARS-CoV-2/FLU/RSV plus assay is intended as an aid in the diagnosis of influenza from Nasopharyngeal swab specimens and should not be used as a sole basis for treatment. Nasal washings and aspirates are unacceptable for Xpert Xpress SARS-CoV-2/FLU/RSV testing.  Fact Sheet for Patients: EntrepreneurPulse.com.au  Fact Sheet for Healthcare Providers: IncredibleEmployment.be  This test is not yet approved or cleared by the [place] FDA and has been authorized for detection and/or diagnosis of SARS-CoV-2 by FDA under an Emergency Use Authorization (EUA). This EUA will remain in effect (meaning this test can be used) for the duration of the COVID-19 declaration under Section 564(b)(1) of the Act, 21 U.S.C. section 360bbb-3(b)(1), unless the authorization is terminated or revoked.  Performed at [facility], [address] [place], [place] [address]   MRSA Next Gen by PCR, Nasal     Status: Abnormal   Collection Time: 06/16/21  6:05 PM   Specimen: Nasal Mucosa; Nasal Swab  Result Value Ref Range Status   MRSA by PCR Next Gen DETECTED (A) NOT DETECTED Final    Comment: RESULT CALLED TO, READ BACK BY AND VERIFIED WITH: MEGAN MCFURR AT 1920 ON 06/16/21  BY SS (NOTE) The GeneXpert MRSA Assay (FDA approved for NASAL specimens only), is one component of a comprehensive MRSA colonization surveillance program. It is not intended to diagnose MRSA infection nor to guide or monitor treatment for MRSA infections. Test performance is not FDA approved in patients less than 44 years old. Performed at [facility], [address] [place], [place] [address]     Coagulation Studies: No results for input(s): LABPROT,  INR in the last 72 hours.  Urinalysis: Recent Labs    06/15/21 1720  COLORURINE YELLOW*  LABSPEC 1.018  PHURINE 6.0  GLUCOSEU NEGATIVE  HGBUR NEGATIVE  BILIRUBINUR NEGATIVE  KETONESUR NEGATIVE  PROTEINUR NEGATIVE  NITRITE NEGATIVE  LEUKOCYTESUR NEGATIVE       Imaging: No results found.   Medications:    sodium chloride (hypertonic) 30 mL/hr at 06/18/21 0914    (feeding supplement) PROSource Plus  30 mL Oral TID BM   atorvastatin  20 mg Oral QHS   Chlorhexidine Gluconate Cloth  6 each Topical Q0600   dexamethasone  6 mg Oral Daily   DULoxetine  30 mg Oral Daily   enoxaparin (LOVENOX) injection  40 mg Subcutaneous Q24H   feeding supplement  1 Container Oral TID BM   multivitamin with minerals  1 tablet Oral Daily   nicotine  21 mg Transdermal Daily   nystatin  5 mL Mouth/Throat QID   pantoprazole  20 mg Oral Daily   sodium chloride flush  3 mL Intravenous Q12H   acetaminophen **OR** acetaminophen, albuterol, hydrALAZINE, ipratropium-albuterol, nicotine polacrilex  Assessment/ Plan:  Mr. Rebel Jeremiah [name] is a 71 y.o.  male Mr. Antonius Hartlage is a 71 y.o. white male with COPD with ongoing tobacco use, GERD, hypertension, , who was admitted to [facility] on 06/15/2021 for  Syncope [R55] Hyponatremia [E87.1]    Hyponatremia: hypoosmolar with hypovolemia. 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 However patient may benefit from IV hydration at this time. - Continue hypertonic  saline - increase 23mL/hr - fluid restriction 1233mL / day   Anemia: hemoglobin 9.4. consistent with iron deficiency.   Hypertension: 108/57. holding home lisinopril and diltiazem.   LOS: 2 Anjolina Byrer 2/11/202310:22 AM

## 2021-06-18 NOTE — Progress Notes (Signed)
PHARMACY CONSULT NOTE - FOLLOW UP  Pharmacy Consult for Electrolyte Monitoring and Replacement   Recent Labs: Potassium (mmol/L)  Date Value  06/18/2021 3.8  10/02/2013 4.4   Magnesium (mg/dL)  Date Value  06/15/2021 1.7   Calcium (mg/dL)  Date Value  06/18/2021 7.6 (L)   Calcium, Total (mg/dL)  Date Value  10/02/2013 9.1   Albumin (g/dL)  Date Value  06/16/2021 2.6 (L)  10/01/2013 3.3 (L)   Phosphorus (mg/dL)  Date Value  06/15/2021 2.9   Sodium (mmol/L)  Date Value  06/18/2021 120 (L)  10/02/2013 136     Assessment: 71 year old male admitted with syncope. Patient found to have sodium 117, started on NS. Repeat sodium down to 114, patient transferred to the ICU for hypertonic saline. Nephrology following.  Date/Time/Level 2/9 1923 Na 114 2/9 2224 Na 115 2/10 0304 Na 116 2/10 0711 Na 117 2/10 1050 Na 120 2/10 1446 Na 121 2/11 0118 Na 122 2/11 0507 Na 120  Goal of Therapy:  Increase in Na no greater than 8-10 mEq in 24 hr  Plan:  Trending appropriately, slow rate of correction (see above) --Continue on Hypertonic saline at 25 ml/hr (2/9 PM >> --Plan to follow Na q4h approximately --Continue to follow along with Nephrology  Pearla Dubonnet, PharmD Clinical Pharmacist 06/18/2021 8:47 AM

## 2021-06-18 NOTE — Progress Notes (Signed)
Progress Note   Patient: Alexxander Kurt BMW:[id number] DOB: 1950-08-12 DOA: 06/15/2021     2 DOS: the patient was seen and examined on 06/18/2021   Brief hospital course: Taken from H&P.  71 year old male with history of GERD, depression, anxiety, hyperlipidemia, tobacco dependence, who presents to the emergency department for chief concerns of confusion/Loss of consciousness. He was hemodynamically stable on arrival, oxygen requirement remained at his baseline of 4 L. Labs pertinent for worsening hyponatremia at 117.  BNP and troponin was within normal limit. Also found to have mild leukocytosis, TSH within normal limit. Also found to have oral thrush with decreased p.o. intake secondary to oral pain for about a month.  Patient was later transferred to ICU for hypertonic saline due to worsening sodium at 114. Slowly improving with hypertonic saline at 30 mL/h-nephrology is on board.   Assessment and Plan: * Syncope- (present on admission) Most likely multifactorial with poor p.o. intake, taking several antihypertensives and hyponatremia.  Repeat echocardiogram with normal EF and grade 1 diastolic dysfunction.  No regional wall motion abnormalities. -Encourage p.o. hydration -Keep holding antihypertensives as blood pressure within normal limit.  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  Current labs consistent with high poor osmolar, hypovolemic. Nephrology was consulted-we will appreciate their recommendations. Patient was transferred to ICU for hypertonic saline. Sodium started improving appropriately, mostly recent at 120 -Continue with hypertonic saline-dose was increased to 30 mL/h by nephrology -Monitor sodium  Thrush, oral- (present on admission) Improving, no difficulty swallowing. -Continue with nystatin swishes -Soft chopped diet  HTN (hypertension)- (present on admission) Blood pressure within goal.  Home  antihypertensives which include diltiazem 120 mg twice daily and lisinopril 10 mg was held. -Continue holding antihypertensives -Continue with as needed hydralazine -We will resume antihypertensives as needed  COPD, moderate ([place])- (present on admission) No concern for acute exacerbation.  On baseline oxygen requirement of 4 L -Continue supplemental oxygen -Continue home bronchodilators  Tobacco dependency- (present on admission) - Continue with nicotine patch  Unintentional weight loss- (present on admission) Patient with poor p.o. intake with oral thrush. -Will need outpatient work-up by PCP  Protein-calorie malnutrition, severe Estimated body mass index is 22.45 kg/m as calculated from the following:   Height as of this encounter: 6' (1.829 m).   Weight as of this encounter: 75.1 kg.  -Dietitian consult   Subjective: Patient continued to feel intermittent lightheadedness.  Appetite and p.o. intake improving.  Physical Exam: Vitals:   06/18/21 1100 06/18/21 1200 06/18/21 1300 06/18/21 1400  BP: 110/63 112/66 114/69 118/63  Pulse: 85 85 88 (!) 119  Resp: 17 12 19  (!) 24  Temp:    98.2 F (36.8 C)  TempSrc:    Oral  SpO2: 96% 94% 96% 92%  Weight:      Height:       General.  Well-developed gentleman, in no acute distress. Pulmonary.  Lungs clear bilaterally, normal respiratory effort. CV.  Regular rate and rhythm, no JVD, rub or murmur. Abdomen.  Soft, nontender, nondistended, BS positive. CNS.  Alert and oriented x3.  No focal neurologic deficit. Extremities.  No edema, no cyanosis, pulses intact and symmetrical. Psychiatry.  Judgment and insight appears normal.  Data Reviewed: Labs and consultants note were reviewed.  Family Communication:   Disposition: Status is: Inpatient Remains inpatient appropriate because: Severity of illness   Planned Discharge Destination: Home with Home Health  DVT prophylaxis.  Lovenox  Time spent: 45 minutes  This record  has been created using Systems analyst. Errors have been sought and corrected,but may not always be located. Such creation errors do not reflect on the standard of care.  Author: Lorella Nimrod, MD 06/18/2021 3:22 PM  For on call review [email or web address].

## 2021-06-18 NOTE — Assessment & Plan Note (Signed)
Improving, no difficulty swallowing. -Continue with nystatin swishes -Soft chopped diet

## 2021-06-18 NOTE — Assessment & Plan Note (Signed)
Blood pressure within goal.  Home antihypertensives which include diltiazem 120 mg twice daily and lisinopril 10 mg was held. -Continue holding antihypertensives -Continue with as needed hydralazine -We will resume antihypertensives as needed

## 2021-06-19 ENCOUNTER — Encounter: Payer: Self-pay | Admitting: Internal Medicine

## 2021-06-19 DIAGNOSIS — E43 Unspecified severe protein-calorie malnutrition: Secondary | ICD-10-CM

## 2021-06-19 LAB — SODIUM
Sodium: 127 mmol/L — ABNORMAL LOW (ref 135–145)
Sodium: 127 mmol/L — ABNORMAL LOW (ref 135–145)
Sodium: 126 mmol/L — ABNORMAL LOW (ref 135–145)

## 2021-06-19 LAB — GLUCOSE, CAPILLARY: Glucose-Capillary: 102 mg/dL — ABNORMAL HIGH (ref 70–99)

## 2021-06-19 MED ORDER — SODIUM CHLORIDE 1 G PO TABS
2.0000 g | ORAL_TABLET | Freq: Two times a day (BID) | ORAL | Status: DC
  Administered 2021-06-19 – 2021-06-24 (×10): 2 g via ORAL
  Filled 2021-06-19 (×12): qty 2

## 2021-06-19 MED ORDER — TRAMADOL HCL 50 MG PO TABS
50.0000 mg | ORAL_TABLET | Freq: Two times a day (BID) | ORAL | Status: DC | PRN
  Administered 2021-06-19 – 2021-06-23 (×8): 50 mg via ORAL
  Filled 2021-06-19 (×9): qty 1

## 2021-06-19 NOTE — Assessment & Plan Note (Signed)
Most likely multifactorial with poor p.o. intake, taking several antihypertensives and hyponatremia.  Repeat echocardiogram with normal EF and grade 1 diastolic dysfunction.  No regional wall motion abnormalities. -Encourage p.o. hydration -Keep holding antihypertensives as blood pressure within normal limit.

## 2021-06-19 NOTE — Progress Notes (Signed)
[facility]  ROUNDING NOTE   Subjective:   Na 127  Patient more awake and alert.   Objective:  Vital signs in last 24 hours:  Temp:  [98.2 F (36.8 C)-99 F (37.2 C)] 98.8 F (37.1 C) (02/12 0739) Pulse Rate:  [82-119] 93 (02/12 0804) Resp:  [11-24] 21 (02/12 0804) BP: (84-118)/(50-69) 101/68 (02/12 0804) SpO2:  [92 %-98 %] 96 % (02/12 0804)  Weight change:  Filed Weights   06/16/21 0500 06/16/21 1800 06/17/21 0409  Weight: 70.6 kg 73.9 kg 75.1 kg    Intake/Output: I/O last 3 completed shifts: In: 2060.4 [P.O.:1080; I.V.:980.4] Out: 1850 [Urine:1850]   Intake/Output this shift:  Total I/O In: 267.7 [P.O.:120; I.V.:147.7] Out: 150 [Urine:150]  Physical Exam: General: NAD, sitting up in bed  Head: Normocephalic, atraumatic. Moist oral mucosal membranes, No thrush  Eyes: Anicteric, PERRL  Neck: Supple, trachea midline  Lungs:  Clear to auscultation  Heart: Regular rate and rhythm  Abdomen:  Soft, nontender  Extremities: no peripheral edema.  Neurologic: Nonfocal, moving all four extremities  Skin: No lesions        Basic Metabolic Panel: Recent Labs  Lab 06/15/21 1511 06/15/21 2042 06/16/21 0530 06/16/21 1441 06/18/21 0507 06/18/21 1049 06/18/21 1459 06/18/21 1850 06/18/21 2040 06/19/21 0251 06/19/21 0725  NA 117*  --  117*   < > 120*   < > 123* 124* 124* 127* 127*  K 4.4  --  4.2  --  3.8  --   --   --   --   --   --   CL 83*  --  85*  --  90*  --   --   --   --   --   --   CO2 27  --  27  --  27  --   --   --   --   --   --   GLUCOSE 101*  --  96  --  109*  --   --   --   --   --   --   BUN 16  --  15  --  19  --   --   --   --   --   --   CREATININE 0.45*  --  0.31*  --  <0.30*  --   --   --   --   --   --   CALCIUM 8.2*  --  7.8*  --  7.6*  --   --   --   --   --   --   MG  --  1.7  --   --   --   --   --   --   --   --   --   PHOS  --  2.9  --   --   --   --   --   --   --   --   --    < > = values in this interval not  displayed.     Liver Function Tests: Recent Labs  Lab 06/16/21 0530  AST 20  ALT 28  ALKPHOS 106  BILITOT 0.8  PROT 5.3*  ALBUMIN 2.6*    No results for input(s): LIPASE, AMYLASE in the last 168 hours. No results for input(s): AMMONIA in the last 168 hours.  CBC: Recent Labs  Lab 06/15/21 1511 06/16/21 0530 06/18/21 0507  WBC 13.9* 14.7* 15.4*  HGB 9.7* 9.4*  9.8*  HCT 28.7* 26.8* 28.6*  MCV 80.8 78.6* 82.2  PLT 351 335 305     Cardiac Enzymes: No results for input(s): CKTOTAL, CKMB, CKMBINDEX, TROPONINI in the last 168 hours.  BNP: Invalid input(s): POCBNP  CBG: Recent Labs  Lab 06/16/21 0546 06/16/21 1813 06/17/21 0418 06/19/21 0527  GLUCAP 109* 113* 100* 102*     Microbiology: Results for orders placed or performed during the hospital encounter of 06/15/21  Resp Panel by RT-PCR (Flu A&B, Covid) Nasopharyngeal Swab     Status: None   Collection Time: 06/15/21  7:16 PM   Specimen: Nasopharyngeal Swab; Nasopharyngeal(NP) swabs in vial transport medium  Result Value Ref Range Status   SARS Coronavirus 2 by RT PCR NEGATIVE NEGATIVE Final    Comment: (NOTE) SARS-CoV-2 target nucleic acids are NOT DETECTED.  The SARS-CoV-2 RNA is generally detectable in upper respiratory specimens during the acute phase of infection. The lowest concentration of SARS-CoV-2 viral copies this assay can detect is 138 copies/mL. A negative result does not preclude SARS-Cov-2 infection and should not be used as the sole basis for treatment or other patient management decisions. A negative result may occur with  improper specimen collection/handling, submission of specimen other than nasopharyngeal swab, presence of viral mutation(s) within the areas targeted by this assay, and inadequate number of viral copies(<138 copies/mL). A negative result must be combined with clinical observations, patient history, and epidemiological information. The expected result is  Negative.  Fact Sheet for Patients:  EntrepreneurPulse.com.au  Fact Sheet for Healthcare Providers:  IncredibleEmployment.be  This test is no t yet approved or cleared by the [place] FDA and  has been authorized for detection and/or diagnosis of SARS-CoV-2 by FDA under an Emergency Use Authorization (EUA). This EUA will remain  in effect (meaning this test can be used) for the duration of the COVID-19 declaration under Section 564(b)(1) of the Act, 21 U.S.C.section 360bbb-3(b)(1), unless the authorization is terminated  or revoked sooner.       Influenza A by PCR NEGATIVE NEGATIVE Final   Influenza B by PCR NEGATIVE NEGATIVE Final    Comment: (NOTE) The Xpert Xpress SARS-CoV-2/FLU/RSV plus assay is intended as an aid in the diagnosis of influenza from Nasopharyngeal swab specimens and should not be used as a sole basis for treatment. Nasal washings and aspirates are unacceptable for Xpert Xpress SARS-CoV-2/FLU/RSV testing.  Fact Sheet for Patients: EntrepreneurPulse.com.au  Fact Sheet for Healthcare Providers: IncredibleEmployment.be  This test is not yet approved or cleared by the [place] FDA and has been authorized for detection and/or diagnosis of SARS-CoV-2 by FDA under an Emergency Use Authorization (EUA). This EUA will remain in effect (meaning this test can be used) for the duration of the COVID-19 declaration under Section 564(b)(1) of the Act, 21 U.S.C. section 360bbb-3(b)(1), unless the authorization is terminated or revoked.  Performed at [facility], [address] [place], [place] [address]   MRSA Next Gen by PCR, Nasal     Status: Abnormal   Collection Time: 06/16/21  6:05 PM   Specimen: Nasal Mucosa; Nasal Swab  Result Value Ref Range Status   MRSA by PCR Next Gen DETECTED (A) NOT DETECTED Final    Comment: RESULT CALLED TO, READ BACK BY AND VERIFIED  WITH: MEGAN MCFURR AT 1920 ON 06/16/21 BY SS (NOTE) The GeneXpert MRSA Assay (FDA approved for NASAL specimens only), is one component of a comprehensive MRSA colonization surveillance program. It is not intended to diagnose MRSA infection nor  to guide or monitor treatment for MRSA infections. Test performance is not FDA approved in patients less than 32 years old. Performed at [facility], [address] [place], [place] [address]     Coagulation Studies: No results for input(s): LABPROT, INR in the last 72 hours.  Urinalysis: No results for input(s): COLORURINE, LABSPEC, PHURINE, GLUCOSEU, HGBUR, BILIRUBINUR, KETONESUR, PROTEINUR, UROBILINOGEN, NITRITE, LEUKOCYTESUR in the last 72 hours.  Invalid input(s): APPERANCEUR     Imaging: No results found.   Medications:      (feeding supplement) PROSource Plus  30 mL Oral TID BM   atorvastatin  20 mg Oral QHS   Chlorhexidine Gluconate Cloth  6 each Topical Q0600   dexamethasone  6 mg Oral Daily   DULoxetine  30 mg Oral Daily   enoxaparin (LOVENOX) injection  40 mg Subcutaneous Q24H   feeding supplement  1 Container Oral TID BM   multivitamin with minerals  1 tablet Oral Daily   nicotine  21 mg Transdermal Daily   nystatin  5 mL Mouth/Throat QID   pantoprazole  20 mg Oral Daily   sodium chloride flush  3 mL Intravenous Q12H   sodium chloride  2 g Oral BID WC   acetaminophen **OR** acetaminophen, albuterol, hydrALAZINE, ipratropium-albuterol, nicotine polacrilex  Assessment/ Plan:  Mr. Leotis Isham is a 70 y.o.  male Mr. Kimo Bancroft is a 71 y.o. white male with COPD with ongoing tobacco use, GERD, hypertension, , who was admitted to [facility] on 06/15/2021 for  Syncope [R55] Hyponatremia [E87.1]    Hyponatremia: hypoosmolar with hypovolemia. 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 However patient may benefit from IV hydration at this time. - Discontinue hypertonic saline  - fluid  restriction 1265mL / day - start salt tabs   Anemia: hemoglobin 9.8. consistent with iron deficiency.   Hypertension: 101/68. holding home lisinopril and diltiazem.   LOS: 3 Marveen Donlon 2/12/202310:00 AM

## 2021-06-19 NOTE — Assessment & Plan Note (Signed)
Blood pressure within goal.  Home antihypertensives which include diltiazem 120 mg twice daily and lisinopril 10 mg was held. -Continue holding antihypertensives -Continue with as needed hydralazine -We will resume antihypertensives as needed

## 2021-06-19 NOTE — Evaluation (Signed)
Occupational Therapy Evaluation Patient Details Name: Jeremiah [name] MRN: [id number] DOB: December 19, 1950 Today's Date: 06/19/2021   History of Present Illness 71 year old male with history of COPD (4L at baseline), GERD, depression, anxiety, hyperlipidemia, tobacco dependence, who presents to the emergency department for chief concerns of confusion/loss of consciousness. Labs pertinent for hyponatremia at 117. While admitted, patient was later transferred to ICU for hypertonic saline due to worsening sodium at 114.   Clinical Impression   Pt seen for OT evaluation this date. This Pryor Curia is familiar with patient from recent hospital admission (Jan 2023). At baseline, pt is MOD-independent for ADLs (requires increased time/effort) and functional mobility (with use of RW). Since previous admission, pt reports that he discharged to SNF and then returned home. Pt lives with friends in a mobile home. Friends assist with IADLs. This date, pt was alert and oriented to self and place; disoriented to situation. Pt initially declined to participate in OT eval/tx d/t migraine and fear of falling however agreeable following significant encouragement from this author. Pt currently requires SUPERVISION for bed mobility. Once seated EOB pt reported dizziness however BP was consistent with BP obtained at bed-level (see below). Pt refused to attempt sit>stand transfer and refused to participate in seated ADLs this date, however participated in seated therapy exercises (see below). At end of session, pt performed lateral scoot transfer toward [facility] requiring only MIN GUARD and returned to supine requiring SUPERVISION. Pt left in bed, with bed placed in chair position and with all needs within reach. Pt would benefit from additional skilled OT services to maximize return to PLOF and minimize risk of future falls, injury, caregiver burden, and readmission. Upon discharge, recommend SNF.      Recommendations for follow up  therapy are one component of a multi-disciplinary discharge planning process, led by the attending physician.  Recommendations may be updated based on patient status, additional functional criteria and insurance authorization.   Follow Up Recommendations  Skilled nursing-short term rehab (<3 hours/day)    Assistance Recommended at Discharge Frequent or constant Supervision/Assistance  Patient can return home with the following A lot of help with bathing/dressing/bathroom;A lot of help with walking and/or transfers    Functional Status Assessment  Patient has had a recent decline in their functional status and demonstrates the ability to make significant improvements in function in a reasonable and predictable amount of time.  Equipment Recommendations  Other (comment) (defer to next venue of care)       Precautions / Restrictions Precautions Precautions: Fall Restrictions Weight Bearing Restrictions: No      Mobility Bed Mobility Overal bed mobility: Modified Independent Bed Mobility: Supine to Sit, Sit to Supine     Supine to sit: Supervision, HOB elevated Sit to supine: Supervision   General bed mobility comments: Requires SUPERVISION d/t pt reporting dizziness following supine>sit transfer    Transfers Overall transfer level: Needs assistance                Lateral/Scoot Transfers: Min guard General transfer comment: MIN GUARD to scoot laterally toward HOB. Pt refused to attempt sit>stand transfer      Balance Overall balance assessment: Needs assistance Sitting-balance support: No upper extremity supported, Feet supported Sitting balance-Leahy Scale: Good Sitting balance - Comments: good sitting balance reaching within BOS at EOB  ADL either performed or assessed with clinical judgement   ADL Overall ADL's : Needs assistance/impaired                                       General ADL  Comments: Pt refused however anticipate requiring MAX A for LB ADLs and MIN-MOD A for UB ADLs     Vision Ability to See in Adequate Light: 0 Adequate Patient Visual Report: No change from baseline              Pertinent Vitals/Pain Pain Assessment Pain Assessment: Faces Faces Pain Scale: Hurts even more Pain Location: head (migrane) Pain Descriptors / Indicators: Aching Pain Intervention(s): Monitored during session     Hand Dominance Right   Extremity/Trunk Assessment Upper Extremity Assessment Upper Extremity Assessment: Generalized weakness   Lower Extremity Assessment Lower Extremity Assessment: Generalized weakness       Communication Communication Communication: No difficulties   Cognition Arousal/Alertness: Awake/alert Behavior During Therapy: WFL for tasks assessed/performed Overall Cognitive Status: No family/caregiver present to determine baseline cognitive functioning                                 General Comments: Alert and oriented to self and place; disoriented to situation. Required significant encouragement to attempt mobility this date (pt vocalizing fear of falling)     General Comments  BP supine 115/66; BP seated EOB 109/64. Pt refused to attempt sit>stand transfer for standing BP reading    Exercises General Exercises - Lower Extremity Ankle Circles/Pumps: Strengthening, Both, 20 reps, Seated Long Arc Quad: Strengthening, Both, 20 reps, Seated        Home Living Family/patient expects to be discharged to:: Private residence Living Arrangements: Non-relatives/Friends Available Help at Discharge: Friend(s);Available PRN/intermittently Type of Home: Mobile home Home Access: Stairs to enter Entrance Stairs-Number of Steps: 10 at front, bilat rails; 2 at back door, but unable to access back door (taken from prior visit) Entrance Stairs-Rails: Right;Left Home Layout: One level     Bathroom Shower/Tub: Walk-in shower;Sponge  bathes at baseline   [employer]: Standard     Home Equipment: Public relations account executive (2 wheels)   Additional Comments: Pt hx obtained from previous admission      Prior Functioning/Environment Prior Level of Function : Independent/Modified Independent             Mobility Comments: Pt reports using RW for functional mobility ADLs Comments: Pt reports MOD-I (increased time/effort) for ADLs. Friends assist with meals        OT Problem List: Decreased strength;Decreased activity tolerance;Impaired balance (sitting and/or standing);Decreased safety awareness;Cardiopulmonary status limiting activity;Pain      OT Treatment/Interventions: Self-care/ADL training;Therapeutic exercise;Energy conservation;DME and/or AE instruction;Therapeutic activities;Patient/family education;Balance training    OT Goals(Current goals can be found in the care plan section) Acute Rehab OT Goals Patient Stated Goal: to go home OT Goal Formulation: With patient Time For Goal Achievement: 07/03/21 Potential to Achieve Goals: Good ADL Goals Pt Will Perform Grooming: with min assist;standing Pt Will Transfer to Toilet: with min assist;stand pivot transfer;bedside commode Pt Will Perform Toileting - Clothing Manipulation and hygiene: with min assist;sitting/lateral leans  OT Frequency: Min 2X/week       AM-PAC OT "6 Clicks" Daily Activity     Outcome Measure Help from another person eating meals?: None Help from another person taking  care of personal grooming?: A Little Help from another person toileting, which includes using toliet, bedpan, or urinal?: A Lot Help from another person bathing (including washing, rinsing, drying)?: A Lot Help from another person to put on and taking off regular upper body clothing?: A Little Help from another person to put on and taking off regular lower body clothing?: A Lot 6 Click Score: 16   End of Session Equipment Utilized During Treatment: Oxygen Nurse  Communication: Mobility status;Other (comment) (BP following supine>sit transfer)  Activity Tolerance: Patient tolerated treatment well Patient left: in bed;with call bell/phone within reach;with bed alarm set  OT Visit Diagnosis: Unsteadiness on feet (R26.81);Muscle weakness (generalized) (M62.81)                Time: 9735-3299 OT Time Calculation (min): 23 min Charges:  OT General Charges $OT Visit: 1 Visit OT Evaluation $OT Eval Moderate Complexity: 1 Mod OT Treatments $Therapeutic Activity: 8-22 mins  Fredirick Maudlin, OTR/L [place]

## 2021-06-19 NOTE — Progress Notes (Signed)
PT Cancellation Note  Patient Details Name: Jeremiah [name] MRN: [id number] DOB: 1950-09-13   Cancelled Treatment:    Reason Eval/Treat Not Completed: Other (comment). Pt refusing PT at this time, stated he needed to use the bedpan. Despite education, encouragement and multiple attempts, pt unwilling to transfer to [facility] with PT. Bedpan placed, and RN notified.   Lieutenant Diego PT, DPT 3:21 PM,06/19/21

## 2021-06-19 NOTE — Assessment & Plan Note (Signed)
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  Current labs consistent with hyposmolar, hypovolemic hyponatremia Nephrology was consulted-we will appreciate their recommendations. Patient was transferred to ICU for hypertonic saline. Sodium started improving appropriately, mostly recent at 127 -Hypertonic saline was discontinued by nephrology -Start him on salt tablets -Monitor sodium

## 2021-06-19 NOTE — Progress Notes (Signed)
Progress Note   Patient: Jeremiah [name] RKY:[id number] DOB: 1951/01/30 DOA: 06/15/2021     3 DOS: the patient was seen and examined on 06/19/2021   Brief hospital course: Taken from H&P.  71 year old male with history of GERD, depression, anxiety, hyperlipidemia, tobacco dependence, who presents to the emergency department for chief concerns of confusion/Loss of consciousness. He was hemodynamically stable on arrival, oxygen requirement remained at his baseline of 4 L. Labs pertinent for worsening hyponatremia at 117.  BNP and troponin was within normal limit. Also found to have mild leukocytosis, TSH within normal limit. Also found to have oral thrush with decreased p.o. intake secondary to oral pain for about a month.  Patient was later transferred to ICU for hypertonic saline due to worsening sodium at 114. Slowly improving with hypertonic saline at 30 mL/h-nephrology is on board.  2/12: Sodium improved to 127.  Hypertonic saline was discontinued and he was restarted on his salt tablets.  PT/OT ordered for discharge planning.   Assessment and Plan: * Syncope- (present on admission) Most likely multifactorial with poor p.o. intake, taking several antihypertensives and hyponatremia.  Repeat echocardiogram with normal EF and grade 1 diastolic dysfunction.  No regional wall motion abnormalities. -Encourage p.o. hydration -Keep holding antihypertensives as blood pressure within normal limit.  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  Current labs consistent with hyposmolar, hypovolemic hyponatremia Nephrology was consulted-we will appreciate their recommendations. Patient was transferred to ICU for hypertonic saline. Sodium started improving appropriately, mostly recent at 127 -Hypertonic saline was discontinued by nephrology -Start him on salt tablets -Monitor sodium  Thrush, oral- (present on admission) Improving, no difficulty  swallowing. -Continue with nystatin swishes -Soft chopped diet  HTN (hypertension)- (present on admission) Blood pressure within goal.  Home antihypertensives which include diltiazem 120 mg twice daily and lisinopril 10 mg was held. -Continue holding antihypertensives -Continue with as needed hydralazine -We will resume antihypertensives as needed  COPD, moderate ([place])- (present on admission) No concern for acute exacerbation.  On baseline oxygen requirement of 4 L -Continue supplemental oxygen -Continue home bronchodilators  Tobacco dependency- (present on admission) - Continue with nicotine patch  Unintentional weight loss- (present on admission) Patient with poor p.o. intake with oral thrush. -Will need outpatient work-up by PCP  Protein-calorie malnutrition, severe Estimated body mass index is 22.45 kg/m as calculated from the following:   Height as of this encounter: 6' (1.829 m).   Weight as of this encounter: 75.1 kg.  -Dietitian consult     Subjective: Patient was seen and examined today.  Appetite improving.  We discussed about the importance of participating with PT and OT, patient agrees to participate with me.  Physical Exam: Vitals:   06/19/21 0739 06/19/21 0804 06/19/21 1028 06/19/21 1547  BP:  101/68 110/67 119/79  Pulse:  93 92 97  Resp:  (!) 21 16 16   Temp: 98.8 F (37.1 C)  98.1 F (36.7 C) 98.5 F (36.9 C)  TempSrc: Oral     SpO2:  96% 97% 97%  Weight:      Height:       General.  Well-developed elderly man, in no acute distress. Pulmonary.  Lungs clear bilaterally, normal respiratory effort. CV.  Regular rate and rhythm, no JVD, rub or murmur. Abdomen.  Soft, nontender, nondistended, BS positive. CNS.  Alert and oriented x3.  No focal neurologic deficit. Extremities.  No edema, no cyanosis, pulses intact and symmetrical. Psychiatry.  Judgment and insight  appears normal.  Data Reviewed: Consultant note and labs reviewed.  Family  Communication:   Disposition: Status is: Inpatient Remains inpatient appropriate because: Severity of illness   Planned Discharge Destination: Skilled nursing facility  DVT prophylaxis.  Lovenox  Time spent: 45 minutes  This record has been created using Systems analyst. Errors have been sought and corrected,but may not always be located. Such creation errors do not reflect on the standard of care.  Author: Lorella Nimrod, MD 06/19/2021 4:27 PM  For on call review [email or web address].

## 2021-06-20 LAB — BASIC METABOLIC PANEL
Anion gap: 8 (ref 5–15)
BUN: 20 mg/dL (ref 8–23)
CO2: 27 mmol/L (ref 22–32)
Calcium: 7.7 mg/dL — ABNORMAL LOW (ref 8.9–10.3)
Chloride: 94 mmol/L — ABNORMAL LOW (ref 98–111)
Creatinine, Ser: 0.3 mg/dL — ABNORMAL LOW (ref 0.61–1.24)
GFR, Estimated: >60 mL/min (ref >60)
Glucose, Bld: 123 mg/dL — ABNORMAL HIGH (ref 70–99)
Potassium: 4 mmol/L (ref 3.5–5.1)
Sodium: 129 mmol/L — ABNORMAL LOW (ref 135–145)

## 2021-06-20 LAB — SODIUM
Sodium: 126 mmol/L — ABNORMAL LOW (ref 135–145)
Sodium: 127 mmol/L — ABNORMAL LOW (ref 135–145)

## 2021-06-20 LAB — GLUCOSE, CAPILLARY: Glucose-Capillary: 116 mg/dL — ABNORMAL HIGH (ref 70–99)

## 2021-06-20 MED ORDER — MUPIROCIN 2 % EX OINT
1.0000 "application " | TOPICAL_OINTMENT | Freq: Two times a day (BID) | CUTANEOUS | Status: DC
  Administered 2021-06-20 – 2021-06-24 (×8): 1 via NASAL
  Filled 2021-06-20: qty 22

## 2021-06-20 MED ORDER — CHLORHEXIDINE GLUCONATE CLOTH 2 % EX PADS
6.0000 | MEDICATED_PAD | Freq: Every day | CUTANEOUS | Status: AC
  Administered 2021-06-20 – 2021-06-24 (×5): 6 via TOPICAL

## 2021-06-20 NOTE — Assessment & Plan Note (Signed)
Improving, no difficulty swallowing. -Continue with nystatin swishes -Soft chopped diet

## 2021-06-20 NOTE — Progress Notes (Signed)
Progress Note   Patient: Jeremiah [name] NIO:[id number] DOB: 23-Aug-1950 DOA: 06/15/2021     4 DOS: the patient was seen and examined on 06/20/2021   Brief hospital course: Taken from H&P.  71 year old male with history of GERD, depression, anxiety, hyperlipidemia, tobacco dependence, who presents to the emergency department for chief concerns of confusion/Loss of consciousness. He was hemodynamically stable on arrival, oxygen requirement remained at his baseline of 4 L. Labs pertinent for worsening hyponatremia at 117.  BNP and troponin was within normal limit. Also found to have mild leukocytosis, TSH within normal limit. Also found to have oral thrush with decreased p.o. intake secondary to oral pain for about a month.  Patient was later transferred to ICU for hypertonic saline due to worsening sodium at 114. Slowly improving with hypertonic saline at 30 mL/h-nephrology is on board.  2/12: Sodium improved to 127.  Hypertonic saline was discontinued and he was restarted on his salt tablets.  PT/OT ordered for discharge planning.  2/13: Sodium continue to improve, at 129 today.  PT/OT is recommending SNF   Assessment and Plan: * Syncope- (present on admission) Most likely multifactorial with poor p.o. intake, taking several antihypertensives and hyponatremia.  Repeat echocardiogram with normal EF and grade 1 diastolic dysfunction.  No regional wall motion abnormalities. -Encourage p.o. hydration -Keep holding antihypertensives as blood pressure within normal limit.  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  Current labs consistent with hyposmolar, hypovolemic hyponatremia Nephrology was consulted-we will appreciate their recommendations. Patient was transferred to ICU for hypertonic saline and received for 2 days. Sodium started improving appropriately, mostly recent at 129 -Continue salt tablets -Monitor sodium  Thrush, oral-  (present on admission) Improving, no difficulty swallowing. -Continue with nystatin swishes -Soft chopped diet  HTN (hypertension)- (present on admission) Blood pressure within goal.  Home antihypertensives which include diltiazem 120 mg twice daily and lisinopril 10 mg was held. -Continue holding antihypertensives -Continue with as needed hydralazine -We will resume antihypertensives as needed  COPD, moderate ([place])- (present on admission) No concern for acute exacerbation.  On baseline oxygen requirement of 4 L -Continue supplemental oxygen -Continue home bronchodilators  Tobacco dependency- (present on admission) - Continue with nicotine patch  Unintentional weight loss- (present on admission) Patient with poor p.o. intake with oral thrush. -Will need outpatient work-up by PCP  Protein-calorie malnutrition, severe Estimated body mass index is 22.45 kg/m as calculated from the following:   Height as of this encounter: 6' (1.829 m).   Weight as of this encounter: 75.1 kg.  -Dietitian consult  Pressure injury of skin- (present on admission) Pressure Injury 06/15/21 Coccyx Medial Stage 2 -  Partial thickness loss of dermis presenting as a shallow open injury with a red, pink wound bed without slough. pink, first layer gone (Active)  06/15/21 0800  Location: Coccyx  Location Orientation: Medial  Staging: Stage 2 -  Partial thickness loss of dermis presenting as a shallow open injury with a red, pink wound bed without slough.  Wound Description (Comments): pink, first layer gone  Present on Admission: Yes     Pressure Injury 06/16/21 Buttocks Right Stage 2 -  Partial thickness loss of dermis presenting as a shallow open injury with a red, pink wound bed without slough. (Active)  06/16/21 2322  Location: Buttocks  Location Orientation: Right  Staging: Stage 2 -  Partial thickness loss of dermis presenting as a shallow open injury with a red, pink wound bed without  slough.   Wound Description (Comments):   Present on Admission: Yes     Subjective: Patient was sitting comfortably in chair when seen today.  No new complaints. He had some transient dizziness with ambulation.  Physical Exam: Vitals:   06/19/21 2045 06/20/21 0030 06/20/21 0448 06/20/21 0737  BP: 125/65 128/66 (!) 106/57 114/81  Pulse: 88 88 86 80  Resp: 18 17 18 18   Temp: 98 F (36.7 C) 98.1 F (36.7 C) 98.8 F (37.1 C) 98.2 F (36.8 C)  TempSrc:  Oral Oral   SpO2: 99% 99% 97% 97%  Weight:      Height:       General.  Chronically ill-appearing elderly man, in no acute distress. Pulmonary.  Lungs clear bilaterally, normal respiratory effort. CV.  Regular rate and rhythm, no JVD, rub or murmur. Abdomen.  Soft, nontender, nondistended, BS positive. CNS.  Alert and oriented .  No focal neurologic deficit. Extremities. UE edema, no cyanosis, pulses intact and symmetrical. Psychiatry.  Judgment and insight appears normal.  Data Reviewed: Prior notes and labs reviewed.  Family Communication:   Disposition: Status is: Inpatient Remains inpatient appropriate because: Severity of illness  Planned Discharge Destination: Skilled nursing facility  DVT prophylaxis.  Lovenox  Time spent: 45 minutes  This record has been created using Systems analyst. Errors have been sought and corrected,but may not always be located. Such creation errors do not reflect on the standard of care.  Author: Lorella Nimrod, MD 06/20/2021 2:21 PM  For on call review [email or web address].

## 2021-06-20 NOTE — Plan of Care (Signed)
°  Problem: Education: Goal: Knowledge of General Education information will improve Description: Including pain rating scale, medication(s)/side effects and non-pharmacologic comfort measures Outcome: Progressing   Problem: Clinical Measurements: Goal: Ability to maintain clinical measurements within normal limits will improve Outcome: Progressing   Problem: Clinical Measurements: Goal: Diagnostic test results will improve Outcome: Progressing   Problem: Clinical Measurements: Goal: Cardiovascular complication will be avoided Outcome: Progressing   Problem: Activity: Goal: Risk for activity intolerance will decrease Outcome: Progressing   Problem: Nutrition: Goal: Adequate nutrition will be maintained Outcome: Progressing   Problem: Elimination: Goal: Will not experience complications related to urinary retention Outcome: Progressing

## 2021-06-20 NOTE — Assessment & Plan Note (Signed)
Blood pressure within goal.  Home antihypertensives which include diltiazem 120 mg twice daily and lisinopril 10 mg was held. -Continue holding antihypertensives -Continue with as needed hydralazine -We will resume antihypertensives as needed

## 2021-06-20 NOTE — Assessment & Plan Note (Signed)
Pressure Injury 06/15/21 Coccyx Medial Stage 2 -  Partial thickness loss of dermis presenting as a shallow open injury with a red, pink wound bed without slough. pink, first layer gone (Active)  06/15/21 0800  Location: Coccyx  Location Orientation: Medial  Staging: Stage 2 -  Partial thickness loss of dermis presenting as a shallow open injury with a red, pink wound bed without slough.  Wound Description (Comments): pink, first layer gone  Present on Admission: Yes     Pressure Injury 06/16/21 Buttocks Right Stage 2 -  Partial thickness loss of dermis presenting as a shallow open injury with a red, pink wound bed without slough. (Active)  06/16/21 2322  Location: Buttocks  Location Orientation: Right  Staging: Stage 2 -  Partial thickness loss of dermis presenting as a shallow open injury with a red, pink wound bed without slough.  Wound Description (Comments):   Present on Admission: Yes

## 2021-06-20 NOTE — Progress Notes (Signed)
Occupational Therapy Treatment Patient Details Name: Jeremiah [name] MRN: [id number] DOB: 07-27-1950 Today's Date: 06/20/2021   History of present illness Pt is a 71 year old male with history of COPD (4L at baseline), GERD, depression, anxiety, hyperlipidemia, tobacco dependence, who presents to the emergency department for chief concerns of confusion/loss of consciousness. Labs pertinent for hyponatremia at 117. Patient was later transferred to ICU for hypertonic saline due to worsening sodium at 114. MD assessment includes: Syncope, hyponatremia, thrush, and severe protein-calorie malnutrition.   OT comments  Upon entering the room, pt seated in recliner chair and requesting to transfer back to bed. Pt reports having set up since PT session ( 5 hours ago). Pt's external male catheter leaking in recliner chair. Pt stands with min guard and takes several steps to the R towards bed with min HHA. Sit >supine with supervision and increased time. OT called RN to assess external catheter and NT present as therapist exits the room.  Pt continues to benefit from OT intervention. All needs within reach.    Recommendations for follow up therapy are one component of a multi-disciplinary discharge planning process, led by the attending physician.  Recommendations may be updated based on patient status, additional functional criteria and insurance authorization.    Follow Up Recommendations  Skilled nursing-short term rehab (<3 hours/day)    Assistance Recommended at Discharge Frequent or constant Supervision/Assistance  Patient can return home with the following  A lot of help with bathing/dressing/bathroom;A lot of help with walking and/or transfers;Help with stairs or ramp for entrance   Equipment Recommendations  Other (comment) (defer to next venue of care)       Precautions / Restrictions Precautions Precautions: Fall Restrictions Weight Bearing Restrictions: No       Mobility Bed  Mobility Overal bed mobility: Needs Assistance Bed Mobility: Sit to Supine       Sit to supine: Supervision   General bed mobility comments: increased time and effort    Transfers Overall transfer level: Needs assistance Equipment used: 1 person hand held assist Transfers: Sit to/from Stand Sit to Stand: Min guard Stand pivot transfers: Min assist         General transfer comment: min A for stand pivot transfer to the R back into bed     Balance Overall balance assessment: Needs assistance Sitting-balance support: No upper extremity supported, Feet supported Sitting balance-Leahy Scale: Good Sitting balance - Comments: good sitting balance reaching within BOS at EOB   Standing balance support: Bilateral upper extremity supported, During functional activity Standing balance-Leahy Scale: Fair                             ADL either performed or assessed with clinical judgement    Extremity/Trunk Assessment Upper Extremity Assessment Upper Extremity Assessment: Generalized weakness   Lower Extremity Assessment Lower Extremity Assessment: Generalized weakness        Vision Patient Visual Report: No change from baseline            Cognition Arousal/Alertness: Awake/alert Behavior During Therapy: WFL for tasks assessed/performed Overall Cognitive Status: No family/caregiver present to determine baseline cognitive functioning                                 General Comments: Alert and oriented to self and place; disoriented to situation.  Pertinent Vitals/ Pain       Pain Assessment Pain Assessment: No/denies pain  Home Living Family/patient expects to be discharged to:: Private residence Living Arrangements: Non-relatives/Friends Available Help at Discharge: Friend(s);Available PRN/intermittently Type of Home: Mobile home Home Access: Stairs to enter Entrance Stairs-Number of Steps: 10 at front, bilat rails;  2 at back door, but unable to access back door Entrance Stairs-Rails: Right;Left Home Layout: One level     Bathroom Shower/Tub: Walk-in shower;Sponge bathes at baseline   [employer]: Standard     Home Equipment: Public relations account executive (2 wheels)              Frequency  Min 2X/week        Progress Toward Goals  OT Goals(current goals can now be found in the care plan section)  Progress towards OT goals: Progressing toward goals  Acute Rehab OT Goals Patient Stated Goal: to get back to bed OT Goal Formulation: With patient Time For Goal Achievement: 07/03/21 Potential to Achieve Goals: Good  Plan Discharge plan remains appropriate;Frequency remains appropriate       AM-PAC OT "6 Clicks" Daily Activity     Outcome Measure   Help from another person eating meals?: None Help from another person taking care of personal grooming?: A Little Help from another person toileting, which includes using toliet, bedpan, or urinal?: A Lot Help from another person bathing (including washing, rinsing, drying)?: A Lot Help from another person to put on and taking off regular upper body clothing?: A Little Help from another person to put on and taking off regular lower body clothing?: A Lot 6 Click Score: 16    End of Session Equipment Utilized During Treatment: Oxygen  OT Visit Diagnosis: Unsteadiness on feet (R26.81);Muscle weakness (generalized) (M62.81)   Activity Tolerance Patient tolerated treatment well   Patient Left in bed;with call bell/phone within reach;with bed alarm set;with nursing/sitter in room   Nurse Communication Mobility status        Time: 1510-1520 OT Time Calculation (min): 10 min  Charges: OT General Charges $OT Visit: 1 Visit OT Treatments $Therapeutic Activity: 8-22 mins  Darleen Crocker, MS, OTR/L , CBIS ascom [phone]  06/20/21, 3:37 PM

## 2021-06-20 NOTE — Progress Notes (Addendum)
[facility]  ROUNDING NOTE   Subjective:   Patient resting quietly Tolerating meals States he's drinking some of prescribed protein shakes   Sodium 129  Objective:  Vital signs in last 24 hours:  Temp:  [98 F (36.7 C)-98.8 F (37.1 C)] 98.2 F (36.8 C) (02/13 0737) Pulse Rate:  [80-97] 80 (02/13 0737) Resp:  [16-18] 18 (02/13 0737) BP: (106-128)/(57-81) 114/81 (02/13 0737) SpO2:  [97 %-99 %] 97 % (02/13 0737)  Weight change:  Filed Weights   06/16/21 0500 06/16/21 1800 06/17/21 0409  Weight: 70.6 kg 73.9 kg 75.1 kg    Intake/Output: I/O last 3 completed shifts: In: 1394.7 [P.O.:960; I.V.:434.7] Out: 1350 [Urine:1350]   Intake/Output this shift:  Total I/O In: -  Out: 800 [Urine:800]  Physical Exam: General: NAD, resting in bed  Head: Normocephalic, atraumatic. Moist oral mucosal membranes, No thrush  Eyes: Anicteric  Lungs:  Clear to auscultation, normal effort  Heart: Regular rate and rhythm  Abdomen:  Soft, nontender  Extremities: no peripheral edema.  Neurologic: Nonfocal, moving all four extremities  Skin: No lesions        Basic Metabolic Panel: Recent Labs  Lab 06/15/21 1511 06/15/21 2042 06/16/21 0530 06/16/21 1441 06/18/21 0507 06/18/21 1049 06/19/21 0251 06/19/21 0725 06/19/21 1822 06/20/21 0429 06/20/21 0810  NA 117*  --  117*   < > 120*   < > 127* 127* 126* 129* 126*  K 4.4  --  4.2  --  3.8  --   --   --   --  4.0  --   CL 83*  --  85*  --  90*  --   --   --   --  94*  --   CO2 27  --  27  --  27  --   --   --   --  27  --   GLUCOSE 101*  --  96  --  109*  --   --   --   --  123*  --   BUN 16  --  15  --  19  --   --   --   --  20  --   CREATININE 0.45*  --  0.31*  --  <0.30*  --   --   --   --  0.30*  --   CALCIUM 8.2*  --  7.8*  --  7.6*  --   --   --   --  7.7*  --   MG  --  1.7  --   --   --   --   --   --   --   --   --   PHOS  --  2.9  --   --   --   --   --   --   --   --   --    < > = values in this interval  not displayed.     Liver Function Tests: Recent Labs  Lab 06/16/21 0530  AST 20  ALT 28  ALKPHOS 106  BILITOT 0.8  PROT 5.3*  ALBUMIN 2.6*    No results for input(s): LIPASE, AMYLASE in the last 168 hours. No results for input(s): AMMONIA in the last 168 hours.  CBC: Recent Labs  Lab 06/15/21 1511 06/16/21 0530 06/18/21 0507  WBC 13.9* 14.7* 15.4*  HGB 9.7* 9.4* 9.8*  HCT 28.7* 26.8* 28.6*  MCV 80.8 78.6* 82.2  PLT 351 335 305     Cardiac Enzymes: No results for input(s): CKTOTAL, CKMB, CKMBINDEX, TROPONINI in the last 168 hours.  BNP: Invalid input(s): POCBNP  CBG: Recent Labs  Lab 06/16/21 0546 06/16/21 1813 06/17/21 0418 06/19/21 0527 06/20/21 0508  GLUCAP 109* 113* 100* 102* 116*     Microbiology: Results for orders placed or performed during the hospital encounter of 06/15/21  Resp Panel by RT-PCR (Flu A&B, Covid) Nasopharyngeal Swab     Status: None   Collection Time: 06/15/21  7:16 PM   Specimen: Nasopharyngeal Swab; Nasopharyngeal(NP) swabs in vial transport medium  Result Value Ref Range Status   SARS Coronavirus 2 by RT PCR NEGATIVE NEGATIVE Final    Comment: (NOTE) SARS-CoV-2 target nucleic acids are NOT DETECTED.  The SARS-CoV-2 RNA is generally detectable in upper respiratory specimens during the acute phase of infection. The lowest concentration of SARS-CoV-2 viral copies this assay can detect is 138 copies/mL. A negative result does not preclude SARS-Cov-2 infection and should not be used as the sole basis for treatment or other patient management decisions. A negative result may occur with  improper specimen collection/handling, submission of specimen other than nasopharyngeal swab, presence of viral mutation(s) within the areas targeted by this assay, and inadequate number of viral copies(<138 copies/mL). A negative result must be combined with clinical observations, patient history, and epidemiological information. The expected  result is Negative.  Fact Sheet for Patients:  EntrepreneurPulse.com.au  Fact Sheet for Healthcare Providers:  IncredibleEmployment.be  This test is no t yet approved or cleared by the [place] FDA and  has been authorized for detection and/or diagnosis of SARS-CoV-2 by FDA under an Emergency Use Authorization (EUA). This EUA will remain  in effect (meaning this test can be used) for the duration of the COVID-19 declaration under Section 564(b)(1) of the Act, 21 U.S.C.section 360bbb-3(b)(1), unless the authorization is terminated  or revoked sooner.       Influenza A by PCR NEGATIVE NEGATIVE Final   Influenza B by PCR NEGATIVE NEGATIVE Final    Comment: (NOTE) The Xpert Xpress SARS-CoV-2/FLU/RSV plus assay is intended as an aid in the diagnosis of influenza from Nasopharyngeal swab specimens and should not be used as a sole basis for treatment. Nasal washings and aspirates are unacceptable for Xpert Xpress SARS-CoV-2/FLU/RSV testing.  Fact Sheet for Patients: EntrepreneurPulse.com.au  Fact Sheet for Healthcare Providers: IncredibleEmployment.be  This test is not yet approved or cleared by the [place] FDA and has been authorized for detection and/or diagnosis of SARS-CoV-2 by FDA under an Emergency Use Authorization (EUA). This EUA will remain in effect (meaning this test can be used) for the duration of the COVID-19 declaration under Section 564(b)(1) of the Act, 21 U.S.C. section 360bbb-3(b)(1), unless the authorization is terminated or revoked.  Performed at [facility], [address] [place], [place] [address]   MRSA Next Gen by PCR, Nasal     Status: Abnormal   Collection Time: 06/16/21  6:05 PM   Specimen: Nasal Mucosa; Nasal Swab  Result Value Ref Range Status   MRSA by PCR Next Gen DETECTED (A) NOT DETECTED Final    Comment: RESULT CALLED TO, READ BACK BY AND VERIFIED  WITH: MEGAN MCFURR AT 1920 ON 06/16/21 BY SS (NOTE) The GeneXpert MRSA Assay (FDA approved for NASAL specimens only), is one component of a comprehensive MRSA colonization surveillance program. It is not intended to diagnose MRSA infection nor to guide or monitor treatment for MRSA infections. Test  performance is not FDA approved in patients less than 31 years old. Performed at [facility], [address] [place], [place] [address]     Coagulation Studies: No results for input(s): LABPROT, INR in the last 72 hours.  Urinalysis: No results for input(s): COLORURINE, LABSPEC, PHURINE, GLUCOSEU, HGBUR, BILIRUBINUR, KETONESUR, PROTEINUR, UROBILINOGEN, NITRITE, LEUKOCYTESUR in the last 72 hours.  Invalid input(s): APPERANCEUR     Imaging: No results found.   Medications:      (feeding supplement) PROSource Plus  30 mL Oral TID BM   atorvastatin  20 mg Oral QHS   Chlorhexidine Gluconate Cloth  6 each Topical Q0600   Chlorhexidine Gluconate Cloth  6 each Topical Q0600   DULoxetine  30 mg Oral Daily   enoxaparin (LOVENOX) injection  40 mg Subcutaneous Q24H   feeding supplement  1 Container Oral TID BM   multivitamin with minerals  1 tablet Oral Daily   mupirocin ointment  1 application Nasal BID   nicotine  21 mg Transdermal Daily   nystatin  5 mL Mouth/Throat QID   pantoprazole  20 mg Oral Daily   sodium chloride flush  3 mL Intravenous Q12H   sodium chloride  2 g Oral BID WC   acetaminophen **OR** acetaminophen, albuterol, hydrALAZINE, ipratropium-albuterol, nicotine polacrilex, traMADol  Assessment/ Plan:  [name] is a 71 y.o.  male [name] is a 71 y.o. white male with COPD with ongoing tobacco use, GERD, hypertension, , who was admitted to [facility] on 06/15/2021 for  Syncope [R55] Hyponatremia [E87.1]    Hyponatremia: hypoosmolar with hypovolemia. 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 However patient may  benefit from IV hydration at this time. - Sodium continues to improve, 129 - Continue prescribed salt tabs with fluid restriction 1262mL / day -Patient encouraged to continue protein supplementation after discharge    Anemia: consistent with iron deficiency.   Hypertension: Holding home lisinopril and diltiazem. BP 114/81   LOS: 4   2/13/202311:48 AM

## 2021-06-20 NOTE — Assessment & Plan Note (Signed)
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  Current labs consistent with hyposmolar, hypovolemic hyponatremia Nephrology was consulted-we will appreciate their recommendations. Patient was transferred to ICU for hypertonic saline and received for 2 days. Sodium started improving appropriately, mostly recent at 129 -Continue salt tablets -Monitor sodium

## 2021-06-20 NOTE — NC FL2 (Signed)
[place] LEVEL OF CARE SCREENING TOOL     IDENTIFICATION  Patient Name: Jeremiah [name] Birthdate: May 08, 1951 Sex: male Admission Date (Current Location): 06/15/2021  [employer] and [place] Number:  Engineering geologist and Address:  [facility], [address] [place], [place] [address]      Provider Number: [id number]  Attending Physician Name and Address:  Lorella Nimrod, MD  Relative Name and Phone Number:       Current Level of Care: Hospital Recommended Level of Care: [place] Prior Approval Number:    Date Approved/Denied:   PASRR Number: [id number]  Discharge Plan: SNF    Current Diagnoses: Patient Active Problem List   Diagnosis Date Noted   Thrush, oral 06/16/2021   Unintentional weight loss 06/16/2021   Pressure injury of skin 06/16/2021   Protein-calorie malnutrition, severe 06/16/2021   Dehydration    Syncope 06/15/2021   Malnutrition of moderate degree 05/31/2021   GERD (gastroesophageal reflux disease) 05/28/2021   Acute respiratory failure with hypoxia ([place]) 05/28/2021   Anemia 10/18/2020   COPD, moderate ([place]) 05/14/2020   Nicotine dependence 05/14/2020   Hyponatremia 05/14/2020   HTN (hypertension) 05/14/2020   Abnormal LFTs 05/14/2020   Acute on chronic respiratory failure with hypoxia ([place]) 10/15/2016   Tobacco dependency 10/15/2016    Orientation RESPIRATION BLADDER Height & Weight     Self, Time  O2 ([place] 4L) Incontinent Weight: 165 lb 9.1 oz (75.1 kg) Height:  6' (182.9 cm)  BEHAVIORAL SYMPTOMS/MOOD NEUROLOGICAL BOWEL NUTRITION STATUS      Continent Diet (DIET DYS 3, Thin fluids; Fluid restriction: 1200 mL Fluid)  AMBULATORY STATUS COMMUNICATION OF NEEDS Skin   Limited Assist Verbally PU Stage and Appropriate Care (open wound right heel foam dressing change every three days)   PU Stage 2 Dressing:  (Coccyx, foam dressing, change every 3 days. Buttocks foam dressing)                    Personal Care Assistance Level of Assistance  Bathing, Feeding, Dressing Bathing Assistance: Limited assistance Feeding assistance: Independent Dressing Assistance: Limited assistance     Functional Limitations Info  Sight, Hearing, Speech Sight Info: Adequate Hearing Info: Adequate Speech Info: Adequate    SPECIAL CARE FACTORS FREQUENCY  PT (By licensed PT), OT (By licensed OT)     PT Frequency: 5x OT Frequency: 5x            Contractures Contractures Info: Not present    Additional Factors Info  Allergies, Code Status Code Status Info: Full Code Allergies Info: Penicillins           Current Medications (06/20/2021):  This is the current hospital active medication list Current Facility-Administered Medications  Medication Dose Route Frequency Provider Last Rate Last Admin   (feeding supplement) PROSource Plus liquid 30 mL  30 mL Oral TID BM Lorella Nimrod, MD   30 mL at 06/20/21 1205   acetaminophen (TYLENOL) tablet 650 mg  650 mg Oral Q6H PRN Lorella Nimrod, MD   650 mg at 06/20/21 6812   Or   acetaminophen (TYLENOL) suppository 650 mg  650 mg Rectal Q6H PRN Lorella Nimrod, MD       albuterol (PROVENTIL) (2.5 MG/3ML) 0.083% nebulizer solution 3 mL  3 mL Inhalation Q6H PRN Lorella Nimrod, MD   3 mL at 06/19/21 1054   atorvastatin (LIPITOR) tablet 20 mg  20 mg Oral QHS Lorella Nimrod, MD   20 mg at 06/19/21  2218   Chlorhexidine Gluconate Cloth 2 % PADS 6 each  6 each Topical Q0600 Lorella Nimrod, MD   6 each at 06/20/21 0915   Chlorhexidine Gluconate Cloth 2 % PADS 6 each  6 each Topical Q0600 Lorella Nimrod, MD   6 each at 06/20/21 1205   DULoxetine (CYMBALTA) DR capsule 30 mg  30 mg Oral Daily Lorella Nimrod, MD   30 mg at 06/20/21 0914   enoxaparin (LOVENOX) injection 40 mg  40 mg Subcutaneous Q24H Lorella Nimrod, MD   40 mg at 06/19/21 2218   feeding supplement (BOOST / RESOURCE BREEZE) liquid 1 Container  1 Container Oral TID BM Lorella Nimrod, MD   1 Container at  06/20/21 1205   hydrALAZINE (APRESOLINE) injection 5 mg  5 mg Intravenous Q6H PRN Lorella Nimrod, MD       ipratropium-albuterol (DUONEB) 0.5-2.5 (3) MG/3ML nebulizer solution 3 mL  3 mL Nebulization Q6H PRN Lorella Nimrod, MD   3 mL at 06/20/21 1144   multivitamin with minerals tablet 1 tablet  1 tablet Oral Daily Lorella Nimrod, MD   1 tablet at 06/20/21 0914   mupirocin ointment (BACTROBAN) 2 % 1 application  1 application Nasal BID Lorella Nimrod, MD   1 application at 40/08/67 1205   nicotine (NICODERM CQ - dosed in mg/24 hours) patch 21 mg  21 mg Transdermal Daily Lorella Nimrod, MD   21 mg at 06/20/21 6195   nicotine polacrilex (NICORETTE) gum 2 mg  2 mg Oral PRN Lorella Nimrod, MD       nystatin (MYCOSTATIN) 100000 UNIT/ML suspension 500,000 Units  5 mL Mouth/Throat QID Lorella Nimrod, MD   500,000 Units at 06/20/21 1204   pantoprazole (PROTONIX) EC tablet 20 mg  20 mg Oral Daily Lorella Nimrod, MD   20 mg at 06/20/21 0914   sodium chloride flush (NS) 0.9 % injection 3 mL  3 mL Intravenous Q12H Lorella Nimrod, MD   3 mL at 06/20/21 0915   sodium chloride tablet 2 g  2 g Oral BID WC Lorella Nimrod, MD   2 g at 06/20/21 0915   traMADol (ULTRAM) tablet 50 mg  50 mg Oral Q12H PRN Lorella Nimrod, MD   50 mg at 06/19/21 2352     Discharge Medications: Please see discharge summary for a list of discharge medications.  Relevant Imaging Results:  Relevant Lab Results:   Additional Information [id number]  Eileen Stanford, LCSW

## 2021-06-20 NOTE — Care Management Important Message (Signed)
Important Message  Patient Details  Name: Jeremiah [name] MRN: [id number] Date of Birth: Jun 14, 1950   Medicare Important Message Given:  N/A - LOS <3 / Initial given by admissions     [name] 06/20/2021, 8:31 AM

## 2021-06-20 NOTE — Assessment & Plan Note (Signed)
Most likely multifactorial with poor p.o. intake, taking several antihypertensives and hyponatremia.  Repeat echocardiogram with normal EF and grade 1 diastolic dysfunction.  No regional wall motion abnormalities. -Encourage p.o. hydration -Keep holding antihypertensives as blood pressure within normal limit.

## 2021-06-20 NOTE — Evaluation (Signed)
Physical Therapy Evaluation Patient Details Name: Jeremiah [name] MRN: [id number] DOB: 1951-02-24 Today's Date: 06/20/2021  History of Present Illness  Pt is a 71 year old male with history of COPD (4L at baseline), GERD, depression, anxiety, hyperlipidemia, tobacco dependence, who presents to the emergency department for chief concerns of confusion/loss of consciousness. Labs pertinent for hyponatremia at 117. Patient was later transferred to ICU for hypertonic saline due to worsening sodium at 114. MD assessment includes: Syncope, hyponatremia, thrush, and severe protein-calorie malnutrition.   Clinical Impression  Pt required occasional redirection to stay on task during the session but pt forth good effort throughout.  Pt found on 4LO2/min with SpO2 dropping to a low of 90-91% and typically in the mid-90s, HR WNL.  Pt required extra effort with most functional tasks below and was only able to amb a max of around 3 feet at the EOB and to chair before becoming too fatigued to continue ambulating.  Pt has limited assistance at home and would not be safe to return to his prior living situation at this time.  Pt will benefit from PT services in a SNF setting upon discharge to safely address deficits listed in patient problem list for decreased caregiver assistance and eventual return to PLOF.        Recommendations for follow up therapy are one component of a multi-disciplinary discharge planning process, led by the attending physician.  Recommendations may be updated based on patient status, additional functional criteria and insurance authorization.  Follow Up Recommendations Skilled nursing-short term rehab (<3 hours/day)    Assistance Recommended at Discharge Frequent or constant Supervision/Assistance  Patient can return home with the following  A lot of help with walking and/or transfers;A little help with bathing/dressing/bathroom;Assistance with cooking/housework;Direct  supervision/assist for medications management;Assist for transportation;Help with stairs or ramp for entrance    Equipment Recommendations None recommended by PT  Recommendations for Other Services       Functional Status Assessment Patient has had a recent decline in their functional status and demonstrates the ability to make significant improvements in function in a reasonable and predictable amount of time.     Precautions / Restrictions Precautions Precautions: Fall Restrictions Weight Bearing Restrictions: No      Mobility  Bed Mobility Overal bed mobility: Modified Independent       Supine to sit: Modified independent (Device/Increase time)     General bed mobility comments: Min extra time and effort only during sup to sit    Transfers Overall transfer level: Needs assistance Equipment used: Rolling walker (2 wheels) Transfers: Sit to/from Stand Sit to Stand: Min guard, From elevated surface           General transfer comment: Mod verbal cues for hand placement and increased trunk flexion    Ambulation/Gait Ambulation/Gait assistance: Min guard Gait Distance (Feet): 3 Feet Assistive device: Rolling walker (2 wheels) Gait Pattern/deviations: Step-through pattern, Decreased step length - right, Decreased step length - left, Shuffle, Trunk flexed Gait velocity: decreased     General Gait Details: Pt able to amb a max of around 3 feet with short, effortful steps and tremulous BLEs but did not require assist to prevent LOB  Stairs            Wheelchair Mobility    Modified Rankin (Stroke Patients Only)       Balance Overall balance assessment: Needs assistance Sitting-balance support: No upper extremity supported, Feet supported Sitting balance-Leahy Scale: Good     Standing balance support: Bilateral upper  extremity supported, During functional activity Standing balance-Leahy Scale: Fair                                Pertinent Vitals/Pain Pain Assessment Pain Assessment: No/denies pain    Home Living Family/patient expects to be discharged to:: Private residence Living Arrangements: Non-relatives/Friends Available Help at Discharge: Friend(s);Available PRN/intermittently Type of Home: Mobile home Home Access: Stairs to enter Entrance Stairs-Rails: Right;Left Entrance Stairs-Number of Steps: 10 at front, bilat rails; 2 at back door, but unable to access back door   Home Layout: One level Home Equipment: BSC/3in1;Rolling Walker (2 wheels)      Prior Function Prior Level of Function : Independent/Modified Independent             Mobility Comments: Ind amb with RW PRN for household ambulation; uses facility w/c for MD visits ADLs Comments: Pt reports MOD-I (increased time/effort) for ADLs. Friends assist with meals     Hand Dominance   Dominant Hand: Right    Extremity/Trunk Assessment   Upper Extremity Assessment Upper Extremity Assessment: Generalized weakness    Lower Extremity Assessment Lower Extremity Assessment: Generalized weakness       Communication   Communication: No difficulties  Cognition Arousal/Alertness: Awake/alert Behavior During Therapy: WFL for tasks assessed/performed Overall Cognitive Status: No family/caregiver present to determine baseline cognitive functioning                                          General Comments      Exercises Total Joint Exercises Ankle Circles/Pumps: AROM, Strengthening, Both, 10 reps Quad Sets: Strengthening, Both, 10 reps Gluteal Sets: Strengthening, Both, 10 reps Towel Squeeze: Strengthening, Both, 10 reps Long Arc Quad: Strengthening, Both, 10 reps, 5 reps Knee Flexion: Strengthening, Both, 10 reps, 5 reps Marching in Standing: AROM, Strengthening, Both, 5 reps, Standing   Assessment/Plan    PT Assessment Patient needs continued PT services  PT Problem List Decreased strength;Decreased  mobility;Decreased activity tolerance;Decreased balance;Decreased knowledge of use of DME       PT Treatment Interventions Gait training;Stair training;Balance training;Functional mobility training;Therapeutic exercise;Therapeutic activities;DME instruction;Patient/family education    PT Goals (Current goals can be found in the Care Plan section)  Acute Rehab PT Goals Patient Stated Goal: To get stronger and go home PT Goal Formulation: With patient Time For Goal Achievement: 07/03/21 Potential to Achieve Goals: Fair    Frequency Min 2X/week     Co-evaluation               AM-PAC PT "6 Clicks" Mobility  Outcome Measure Help needed turning from your back to your side while in a flat bed without using bedrails?: A Little Help needed moving from lying on your back to sitting on the side of a flat bed without using bedrails?: A Little Help needed moving to and from a bed to a chair (including a wheelchair)?: A Little Help needed standing up from a chair using your arms (e.g., wheelchair or bedside chair)?: A Little Help needed to walk in hospital room?: A Lot Help needed climbing 3-5 steps with a railing? : Total 6 Click Score: 15    End of Session Equipment Utilized During Treatment: Oxygen;Gait belt Activity Tolerance: Patient tolerated treatment well Patient left: in chair;with call bell/phone within reach;with chair alarm set Nurse Communication: Mobility status;Weight bearing status  PT Visit Diagnosis: Unsteadiness on feet (R26.81);Muscle weakness (generalized) (M62.81);Difficulty in walking, not elsewhere classified (R26.2)    Time: 6153-7943 PT Time Calculation (min) (ACUTE ONLY): 27 min   Charges:   PT Evaluation $PT Eval Moderate Complexity: 1 Mod PT Treatments $Therapeutic Exercise: 8-22 mins        D. Scott Nova Schmuhl PT, DPT 06/20/21, 1:42 PM

## 2021-06-21 LAB — BASIC METABOLIC PANEL
Anion gap: 6 (ref 5–15)
BUN: 14 mg/dL (ref 8–23)
CO2: 32 mmol/L (ref 22–32)
Calcium: 7.9 mg/dL — ABNORMAL LOW (ref 8.9–10.3)
Chloride: 90 mmol/L — ABNORMAL LOW (ref 98–111)
Creatinine, Ser: <0.3 mg/dL — ABNORMAL LOW (ref 0.61–1.24)
Glucose, Bld: 74 mg/dL (ref 70–99)
Potassium: 4.2 mmol/L (ref 3.5–5.1)
Sodium: 128 mmol/L — ABNORMAL LOW (ref 135–145)

## 2021-06-21 LAB — GLUCOSE, CAPILLARY: Glucose-Capillary: 113 mg/dL — ABNORMAL HIGH (ref 70–99)

## 2021-06-21 NOTE — TOC Progression Note (Addendum)
Transition of Care [facility]) - Progression Note    Patient Details  Name: Jeremiah [name] MRN: [id number] Date of Birth: [birth year]  [facility] CM/SW Contact  Eileen Stanford, LCSW Phone Number: 06/21/2021, 1:54 PM  Clinical Narrative:   CSW has made multiple attempts to reach [name] per pt's request.  CSW spoke with [facility] Supervisor and we can not dc home unless we get in touch with [name] due to it being her house.       Expected Discharge Plan and Services                                                 Social Determinants of Health (SDOH) Interventions    Readmission Risk Interventions Readmission Risk Prevention Plan 10/21/2020 09/27/2020 06/17/2020  Transportation Screening Complete Complete Complete  PCP or Specialist Appt within 3-5 Days Complete Complete Complete  HRI or Home Care Consult Complete Complete Complete  Social Work Consult for [place] Planning/Counseling Complete Not Complete Complete  SW consult not completed comments - RNCM assigned to patient -  Palliative Care Screening Not Applicable Not Applicable Not Applicable  Medication Review (RN Care Manager) Complete Complete Complete  Some recent data might be hidden

## 2021-06-21 NOTE — Progress Notes (Signed)
Occupational Therapy Treatment Patient Details Name: Jeremiah [name] MRN: [id number] DOB: [birth year] Today's Date: 06/21/2021   History of present illness Pt is a 71 year old male with history of COPD (4L at baseline), GERD, depression, anxiety, hyperlipidemia, tobacco dependence, who presents to the emergency department for chief concerns of confusion/loss of consciousness. Labs pertinent for hyponatremia at 117. Patient was later transferred to ICU for hypertonic saline due to worsening sodium at 114. MD assessment includes: Syncope, hyponatremia, thrush, and severe protein-calorie malnutrition.   OT comments  Pt. was finishing lunch upon arrival. Pt. education was provided about A/E use for LE ADLS, energy conservation, and work simplification techniques. Pt. was provided with a visual handout. Pt. Education was provided about pursed lip breathing techniques. Pt. SO2 92% on 4L O2 via [place]. HR 95 bpms. Reviewed pt.'s home routines, and anticipated assist available. Pt. Was assisted with repositioning to assume an upright midline position with repositioning pillows. Pt. Continues to benefit from OT services for ADL training, A/E training, and pt./caregiver Education about energy conservation, work simplification, home modification, and DME. Pt. Continues to be appropriate for SNF level of care upon discharge, with follow-up OT services.    Recommendations for follow up therapy are one component of a multi-disciplinary discharge planning process, led by the attending physician.  Recommendations may be updated based on patient status, additional functional criteria and insurance authorization.    Follow Up Recommendations  Skilled nursing-short term rehab (<3 hours/day)    Assistance Recommended at Discharge    Patient can return home with the following  A lot of help with bathing/dressing/bathroom;A lot of help with walking and/or transfers;Help with stairs or ramp for entrance   Equipment  Recommendations       Recommendations for Other Services      Precautions / Restrictions Precautions Precautions: Fall Restrictions Weight Bearing Restrictions: No       Mobility Bed Mobility Overal bed mobility: Needs Assistance       Supine to sit: Modified independent (Device/Increase time) Sit to supine: Supervision   General bed mobility comments: Per chart    Transfers Overall transfer level: Needs assistance Equipment used: 1 person hand held assist Transfers: Sit to/from Stand Sit to Stand: Min guard Stand pivot transfers: Min assist         General transfer comment: Per chart     Balance                                           ADL either performed or assessed with clinical judgement   ADL   Eating/Feeding: Independent;Sitting       Upper Body Bathing: Set up;Sitting       Upper Body Dressing : Sitting;Moderate assistance   Lower Body Dressing: Maximal assistance                      Extremity/Trunk Assessment Upper Extremity Assessment Upper Extremity Assessment: Generalized weakness            Vision Patient Visual Report: No change from baseline (Pt. reports that he does not have his glasses present)     Perception     Praxis      Cognition Arousal/Alertness: Awake/alert Behavior During Therapy: WFL for tasks assessed/performed  Exercises      Shoulder Instructions       General Comments      Pertinent Vitals/ Pain       Pain Assessment Pain Assessment: 0-10 Faces Pain Scale: Hurts little more Pain Location: head (migrane) Pain Descriptors / Indicators: Aching Pain Intervention(s): Monitored during session  Home Living                                          Prior Functioning/Environment              Frequency  Min 2X/week        Progress Toward Goals  OT Goals(current goals can now be  found in the care plan section)  Progress towards OT goals: Progressing toward goals     Plan Discharge plan remains appropriate;Frequency remains appropriate    Co-evaluation                 AM-PAC OT "6 Clicks" Daily Activity     Outcome Measure   Help from another person eating meals?: None Help from another person taking care of personal grooming?: A Little Help from another person toileting, which includes using toliet, bedpan, or urinal?: A Lot Help from another person bathing (including washing, rinsing, drying)?: A Lot Help from another person to put on and taking off regular upper body clothing?: A Little Help from another person to put on and taking off regular lower body clothing?: A Lot 6 Click Score: 16    End of Session Equipment Utilized During Treatment: Oxygen  OT Visit Diagnosis: Unsteadiness on feet (R26.81);Muscle weakness (generalized) (M62.81)   Activity Tolerance Patient tolerated treatment well   Patient Left in bed;with call bell/phone within reach;with bed alarm set;with nursing/sitter in room   Nurse Communication          Time: 0488-8916 OT Time Calculation (min): 23 min  Charges: OT General Charges $OT Visit: 1 Visit OT Treatments $Self Care/Home Management : 23-37 mins  Harrel Carina, MS, OTR/L   Harrel Carina 06/21/2021, 2:38 PM

## 2021-06-21 NOTE — Progress Notes (Signed)
Physical Therapy Treatment Patient Details Name: Jeremiah [name] MRN: [id number] DOB: 09/25/1950 Today's Date: 06/21/2021   History of Present Illness Pt is a 71 year old male with history of COPD (4L at baseline), GERD, depression, anxiety, hyperlipidemia, tobacco dependence, who presents to the emergency department for chief concerns of confusion/loss of consciousness. Labs pertinent for hyponatremia at 117. Patient was later transferred to ICU for hypertonic saline due to worsening sodium at 114. MD assessment includes: Syncope, hyponatremia, thrush, and severe protein-calorie malnutrition.    PT Comments    Pt required min encouragement to participate during the session but then put forth good effort throughout.  Pt found on 4LO2/min with SpO2 93%.  Pt required no physical assistance during the session but did required increased time and effort with functional tasks.  Pt ambulated a self-selected maximum distance of 15 this session with grossly improved cadence and BLE foot clearance compared to the prior session.  Pt's SpO2 dropped to a low of 77% and took several minutes to slowly increase back to >/= 90%, nursing notified.  Pt will benefit from PT services in a SNF setting upon discharge to safely address deficits listed in patient problem list for decreased caregiver assistance and eventual return to PLOF.    Recommendations for follow up therapy are one component of a multi-disciplinary discharge planning process, led by the attending physician.  Recommendations may be updated based on patient status, additional functional criteria and insurance authorization.  Follow Up Recommendations  Skilled nursing-short term rehab (<3 hours/day)     Assistance Recommended at Discharge Frequent or constant Supervision/Assistance  Patient can return home with the following A lot of help with walking and/or transfers;A little help with bathing/dressing/bathroom;Assistance with  cooking/housework;Direct supervision/assist for medications management;Assist for transportation;Help with stairs or ramp for entrance   Equipment Recommendations  None recommended by PT    Recommendations for Other Services       Precautions / Restrictions Precautions Precautions: Fall Restrictions Weight Bearing Restrictions: No Other Position/Activity Restrictions: Watch SpO2 closely     Mobility  Bed Mobility Overal bed mobility: Modified Independent             General bed mobility comments: Min extra time and effort only    Transfers Overall transfer level: Needs assistance Equipment used: Rolling walker (2 wheels) Transfers: Sit to/from Stand Sit to Stand: Min guard           General transfer comment: Good control and stability    Ambulation/Gait Ambulation/Gait assistance: Min guard Gait Distance (Feet): 15 Feet Assistive device: Rolling walker (2 wheels) Gait Pattern/deviations: Step-through pattern, Decreased step length - right, Decreased step length - left, Trunk flexed Gait velocity: decreased     General Gait Details: Slow cadence but with improved foot clearance and cadence; SpO2 on 4LO2/min dropped from 93% at rest to 77% after amb with pt reporting mod SOB and fatigue; SpO2 back to 90% after 2-3 min of PLB, nursing notified.   Stairs             Wheelchair Mobility    Modified Rankin (Stroke Patients Only)       Balance Overall balance assessment: Needs assistance Sitting-balance support: No upper extremity supported, Feet supported Sitting balance-Leahy Scale: Good     Standing balance support: Bilateral upper extremity supported, During functional activity, Reliant on assistive device for balance Standing balance-Leahy Scale: Fair  Cognition Arousal/Alertness: Awake/alert Behavior During Therapy: WFL for tasks assessed/performed Overall Cognitive Status: No family/caregiver  present to determine baseline cognitive functioning                                          Exercises Total Joint Exercises Ankle Circles/Pumps: AROM, Strengthening, Both, 10 reps Quad Sets: Strengthening, Both, 10 reps Gluteal Sets: Strengthening, Both, 10 reps Long Arc Quad: Strengthening, Both, 10 reps, 5 reps Knee Flexion: Strengthening, Both, 10 reps, 5 reps Other Exercises Other Exercises: HEP education/review for BLE APs, QS, GS, and LAQs PLB education provided    General Comments        Pertinent Vitals/Pain Pain Assessment Pain Assessment: 0-10 Pain Score: 6  Pain Location: head (migrane) Pain Descriptors / Indicators: Headache Pain Intervention(s): Premedicated before session, Monitored during session    Home Living                          Prior Function            PT Goals (current goals can now be found in the care plan section) Progress towards PT goals: Progressing toward goals    Frequency    Min 2X/week      PT Plan Current plan remains appropriate    Co-evaluation              AM-PAC PT "6 Clicks" Mobility   Outcome Measure  Help needed turning from your back to your side while in a flat bed without using bedrails?: A Little Help needed moving from lying on your back to sitting on the side of a flat bed without using bedrails?: A Little Help needed moving to and from a bed to a chair (including a wheelchair)?: A Little Help needed standing up from a chair using your arms (e.g., wheelchair or bedside chair)?: A Little Help needed to walk in hospital room?: A Lot Help needed climbing 3-5 steps with a railing? : Total 6 Click Score: 15    End of Session Equipment Utilized During Treatment: Oxygen;Gait belt Activity Tolerance: Other (comment) (SpO2 drop per above) Patient left: in bed;with call bell/phone within reach;with bed alarm set Nurse Communication: Mobility status;Other (comment) (SpO2 drop with  amb per above) PT Visit Diagnosis: Unsteadiness on feet (R26.81);Muscle weakness (generalized) (M62.81);Difficulty in walking, not elsewhere classified (R26.2)     Time: 5520-8022 PT Time Calculation (min) (ACUTE ONLY): 24 min  Charges:  $Therapeutic Exercise: 8-22 mins $Therapeutic Activity: 8-22 mins                     D. Scott Rechy Bost PT, DPT 06/21/21, 5:04 PM

## 2021-06-21 NOTE — Care Management Important Message (Signed)
Important Message  Patient Details  Name: Jeremiah [name] MRN: [id number] Date of Birth: 12-25-1950   Medicare Important Message Given:  Yes     Juliann Pulse A Ross Bender 06/21/2021, 2:46 PM

## 2021-06-21 NOTE — Assessment & Plan Note (Signed)
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  Current labs consistent with hyposmolar, hypovolemic hyponatremia Nephrology was consulted-we will appreciate their recommendations. Patient was transferred to ICU for hypertonic saline and received for 2 days. Sodium started improving appropriately, mostly recent at 128 -Continue salt tablets -Monitor sodium

## 2021-06-21 NOTE — Progress Notes (Signed)
Progress Note   Patient: Jeremiah [name] QTM:[id number] DOB: 1950-11-22 DOA: 06/15/2021     5 DOS: the patient was seen and examined on 06/21/2021   Brief hospital course: Taken from H&P.  71 year old male with history of GERD, depression, anxiety, hyperlipidemia, tobacco dependence, who presents to the emergency department for chief concerns of confusion/Loss of consciousness. He was hemodynamically stable on arrival, oxygen requirement remained at his baseline of 4 L. Labs pertinent for worsening hyponatremia at 117.  BNP and troponin was within normal limit. Also found to have mild leukocytosis, TSH within normal limit. Also found to have oral thrush with decreased p.o. intake secondary to oral pain for about a month.  Patient was later transferred to ICU for hypertonic saline due to worsening sodium at 114. Slowly improving with hypertonic saline at 30 mL/h-nephrology is on board.  2/12: Sodium improved to 127.  Hypertonic saline was discontinued and he was restarted on his salt tablets.  PT/OT ordered for discharge planning.  2/13: Sodium continue to improve, at 129 today.  PT/OT is recommending SNF  2/14: Patient does not have any rehab days left, unable to pay out-of-pocket.  He agrees to go home with home health services but need permission from a friend called [name] as he lives with her.  Unable to reach her by me and by TOC even after multiple attempts. Medically stable for discharge   Assessment and Plan: * Syncope- (present on admission) Most likely multifactorial with poor p.o. intake, taking several antihypertensives and hyponatremia.  Repeat echocardiogram with normal EF and grade 1 diastolic dysfunction.  No regional wall motion abnormalities. -Encourage p.o. hydration -Keep holding antihypertensives as blood pressure within normal limit.  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  Current labs consistent with  hyposmolar, hypovolemic hyponatremia Nephrology was consulted-we will appreciate their recommendations. Patient was transferred to ICU for hypertonic saline and received for 2 days. Sodium started improving appropriately, mostly recent at 128 -Continue salt tablets -Monitor sodium  Thrush, oral- (present on admission) Improving, no difficulty swallowing. -Continue with nystatin swishes -Soft chopped diet  HTN (hypertension)- (present on admission) Blood pressure within goal.  Home antihypertensives which include diltiazem 120 mg twice daily and lisinopril 10 mg was held. -Continue holding antihypertensives -Continue with as needed hydralazine -We will resume antihypertensives as needed  COPD, moderate ([place])- (present on admission) No concern for acute exacerbation.  On baseline oxygen requirement of 4 L -Continue supplemental oxygen -Continue home bronchodilators  Tobacco dependency- (present on admission) - Continue with nicotine patch  Unintentional weight loss- (present on admission) Patient with poor p.o. intake with oral thrush. -Will need outpatient work-up by PCP  Protein-calorie malnutrition, severe Estimated body mass index is 22.45 kg/m as calculated from the following:   Height as of this encounter: 6' (1.829 m).   Weight as of this encounter: 75.1 kg.  -Dietitian consult  Pressure injury of skin- (present on admission) Pressure Injury 06/15/21 Coccyx Medial Stage 2 -  Partial thickness loss of dermis presenting as a shallow open injury with a red, pink wound bed without slough. pink, first layer gone (Active)  06/15/21 0800  Location: Coccyx  Location Orientation: Medial  Staging: Stage 2 -  Partial thickness loss of dermis presenting as a shallow open injury with a red, pink wound bed without slough.  Wound Description (Comments): pink, first layer gone  Present on Admission: Yes     Pressure Injury 06/16/21 Buttocks Right Stage 2 -  Partial thickness loss  of dermis presenting as a shallow open injury with a red, pink wound bed without slough. (Active)  06/16/21 2322  Location: Buttocks  Location Orientation: Right  Staging: Stage 2 -  Partial thickness loss of dermis presenting as a shallow open injury with a red, pink wound bed without slough.  Wound Description (Comments):   Present on Admission: Yes     Subjective: Patient was seen and examined today.  He was concerned that he cannot pay out-of-pocket as ran out of rehab days.  He was requesting to talk to his friend [name] as he was living with her so he can go home with home health services.  Physical Exam: Vitals:   06/21/21 0054 06/21/21 0825 06/21/21 1131 06/21/21 1512  BP: 108/60 131/75 134/72 136/76  Pulse: 83 87 90 94  Resp:  18 16 17   Temp:  98.1 F (36.7 C) 98.2 F (36.8 C) 98.3 F (36.8 C)  TempSrc:      SpO2: 93% 93% 94% 94%  Weight:      Height:       General.     In no acute distress. Pulmonary.  Lungs clear bilaterally, normal respiratory effort. CV.  Regular rate and rhythm, no JVD, rub or murmur. Abdomen.  Soft, nontender, nondistended, BS positive. CNS.  Alert and oriented .  No focal neurologic deficit. Extremities.  No edema, no cyanosis, pulses intact and symmetrical. Psychiatry.  Judgment and insight appears normal.  Data Reviewed: Other notes and labs reviewed.  Family Communication: Multiple failed attempts to reach friend [name]  Disposition: Status is: Inpatient Remains inpatient appropriate because: Unsafe discharge   Planned Discharge Destination: Home with Home Health  DVT prophylaxis.  Lovenox  Time spent: 40 minutes  This record has been created using Systems analyst. Errors have been sought and corrected,but may not always be located. Such creation errors do not reflect on the standard of care.  Author: Lorella Nimrod, MD 06/21/2021 4:19 PM  For on call review [email or web address].

## 2021-06-21 NOTE — TOC Progression Note (Signed)
Transition of Care [facility]) - Progression Note    Patient Details  Name: Jeremiah [name] MRN: [id number] Date of Birth: 06-06-1950  Transition of Care [facility]) CM/SW Contact  Eileen Stanford, LCSW Phone Number: 06/21/2021, 10:42 AM  Clinical Narrative:   CSW confirmed with Tammy at [facility] that pt can not return there as pt has a balance. CSW spoke with pt at bedside and explained this and pt is understanding. CSW explained that pt could dc home with home health services. Pt already has his DME at home, including 02. Pt is on board with this dc plan however, states that CSW will have to get in contact with [name] to make sure she is ok with it. CSW has called [name] and unable to reach her will continue to try.         Expected Discharge Plan and Services                                                 Social Determinants of Health (SDOH) Interventions    Readmission Risk Interventions Readmission Risk Prevention Plan 10/21/2020 09/27/2020 06/17/2020  Transportation Screening Complete Complete Complete  PCP or Specialist Appt within 3-5 Days Complete Complete Complete  HRI or Home Care Consult Complete Complete Complete  Social Work Consult for [place] Planning/Counseling Complete Not Complete Complete  SW consult not completed comments - RNCM assigned to patient -  Palliative Care Screening Not Applicable Not Applicable Not Applicable  Medication Review (RN Care Manager) Complete Complete Complete  Some recent data might be hidden

## 2021-06-21 NOTE — Progress Notes (Signed)
Nutrition Follow-up  DOCUMENTATION CODES:   Severe malnutrition in context of chronic illness  INTERVENTION:   -Continue 30 ml Prosource Plus TID, each supplement provides 100 kcals and 15 grams protein -Continue Boost Breeze po TID, each supplement provides 250 kcal and 9 grams of protein  -Continue MVI with minerals daily -Continue dysphagia 3 diet  NUTRITION DIAGNOSIS:   Severe Malnutrition related to chronic illness (COPD) as evidenced by moderate fat depletion, severe fat depletion, severe muscle depletion.  Ongoing  GOAL:   Patient will meet greater than or equal to 90% of their needs  Progressing   MONITOR:   PO intake, Supplement acceptance, Labs, Weight trends, Skin, I & O's  REASON FOR ASSESSMENT:   Malnutrition Screening Tool    ASSESSMENT:   71 year old male with history of GERD, depression, anxiety, hyperlipidemia, tobacco dependence, who presents to the emergency department for chief concerns of confusion/Loss of consciousness.  Reviewed I/O's: -800 ml x 24 hours and -867 ml since admission  UOP: 800 ml x 24 hours  Pt sleeping soundly at time of visit. He did not respind to name being called. Lunch tray was just delivered; unattempted.   Pt's intake has improved since last visit. Noted meal completions 50-100%. Pt is consuming Prosource and Boost Breeze supplements.   Per TOC notes, plan to d/c home with home health services once medically stable.    Medications reviewed.   Labs reviewed: Na: 128, CBGS: 102-116.   Diet Order:   Diet Order             DIET DYS 3 Room service appropriate? Yes; Fluid consistency: Thin; Fluid restriction: 1200 mL Fluid  Diet effective now                   EDUCATION NEEDS:   Education needs have been addressed  Skin:  Skin Assessment: Skin Integrity Issues: Skin Integrity Issues:: Stage II Stage II: coccyx  Last BM:  06/14/21  Height:   Ht Readings from Last 1 Encounters:  06/16/21 6' (1.829 m)     Weight:   Wt Readings from Last 1 Encounters:  06/17/21 75.1 kg    Ideal Body Weight:  80.9 kg  BMI:  Body mass index is 22.45 kg/m.  Estimated Nutritional Needs:   Kcal:  2100-2300  Protein:  125-140 grams  Fluid:  > 2 L    Loistine Chance, RD, LDN, [place] Registered Dietitian II Certified Diabetes Care and Education Specialist Please refer to [facility] for RD and/or RD on-call/weekend/after hours pager

## 2021-06-21 NOTE — Progress Notes (Addendum)
[facility]  ROUNDING NOTE   Subjective:   Patient sitting up eating breakfast Alert and oriented Denies shortness of breath  Sodium 128  Objective:  Vital signs in last 24 hours:  Temp:  [98.1 F (36.7 C)-98.2 F (36.8 C)] 98.2 F (36.8 C) (02/14 1131) Pulse Rate:  [80-90] 90 (02/14 1131) Resp:  [16-18] 16 (02/14 1131) BP: (108-134)/(60-82) 134/72 (02/14 1131) SpO2:  [93 %-99 %] 94 % (02/14 1131)  Weight change:  Filed Weights   06/16/21 0500 06/16/21 1800 06/17/21 0409  Weight: 70.6 kg 73.9 kg 75.1 kg    Intake/Output: I/O last 3 completed shifts: In: 480 [P.O.:480] Out: 800 [Urine:800]   Intake/Output this shift:  Total I/O In: 240 [P.O.:240] Out: 250 [Urine:250]  Physical Exam: General: NAD, resting in bed  Head: Normocephalic, atraumatic. Moist oral mucosal membranes, No thrush  Eyes: Anicteric  Lungs:  Clear to auscultation, normal effort  Heart: Regular rate and rhythm  Abdomen:  Soft, nontender  Extremities: no peripheral edema.  Neurologic: Nonfocal, moving all four extremities  Skin: No lesions        Basic Metabolic Panel: Recent Labs  Lab 06/15/21 1511 06/15/21 2042 06/16/21 0530 06/16/21 1441 06/18/21 0507 06/18/21 1049 06/19/21 1822 06/20/21 0429 06/20/21 0810 06/20/21 1914 06/21/21 0814  NA 117*  --  117*   < > 120*   < > 126* 129* 126* 127* 128*  K 4.4  --  4.2  --  3.8  --   --  4.0  --   --  4.2  CL 83*  --  85*  --  90*  --   --  94*  --   --  90*  CO2 27  --  27  --  27  --   --  27  --   --  32  GLUCOSE 101*  --  96  --  109*  --   --  123*  --   --  74  BUN 16  --  15  --  19  --   --  20  --   --  14  CREATININE 0.45*  --  0.31*  --  <0.30*  --   --  0.30*  --   --  <0.30*  CALCIUM 8.2*  --  7.8*  --  7.6*  --   --  7.7*  --   --  7.9*  MG  --  1.7  --   --   --   --   --   --   --   --   --   PHOS  --  2.9  --   --   --   --   --   --   --   --   --    < > = values in this interval not displayed.      Liver Function Tests: Recent Labs  Lab 06/16/21 0530  AST 20  ALT 28  ALKPHOS 106  BILITOT 0.8  PROT 5.3*  ALBUMIN 2.6*    No results for input(s): LIPASE, AMYLASE in the last 168 hours. No results for input(s): AMMONIA in the last 168 hours.  CBC: Recent Labs  Lab 06/15/21 1511 06/16/21 0530 06/18/21 0507  WBC 13.9* 14.7* 15.4*  HGB 9.7* 9.4* 9.8*  HCT 28.7* 26.8* 28.6*  MCV 80.8 78.6* 82.2  PLT 351 335 305     Cardiac Enzymes: No results for input(s): CKTOTAL, CKMB, CKMBINDEX,  TROPONINI in the last 168 hours.  BNP: Invalid input(s): POCBNP  CBG: Recent Labs  Lab 06/16/21 1813 06/17/21 0418 06/19/21 0527 06/20/21 0508 06/21/21 0440  GLUCAP 113* 100* 102* 116* 113*     Microbiology: Results for orders placed or performed during the hospital encounter of 06/15/21  Resp Panel by RT-PCR (Flu A&B, Covid) Nasopharyngeal Swab     Status: None   Collection Time: 06/15/21  7:16 PM   Specimen: Nasopharyngeal Swab; Nasopharyngeal(NP) swabs in vial transport medium  Result Value Ref Range Status   SARS Coronavirus 2 by RT PCR NEGATIVE NEGATIVE Final    Comment: (NOTE) SARS-CoV-2 target nucleic acids are NOT DETECTED.  The SARS-CoV-2 RNA is generally detectable in upper respiratory specimens during the acute phase of infection. The lowest concentration of SARS-CoV-2 viral copies this assay can detect is 138 copies/mL. A negative result does not preclude SARS-Cov-2 infection and should not be used as the sole basis for treatment or other patient management decisions. A negative result may occur with  improper specimen collection/handling, submission of specimen other than nasopharyngeal swab, presence of viral mutation(s) within the areas targeted by this assay, and inadequate number of viral copies(<138 copies/mL). A negative result must be combined with clinical observations, patient history, and epidemiological information. The expected result is  Negative.  Fact Sheet for Patients:  EntrepreneurPulse.com.au  Fact Sheet for Healthcare Providers:  IncredibleEmployment.be  This test is no t yet approved or cleared by the [place] FDA and  has been authorized for detection and/or diagnosis of SARS-CoV-2 by FDA under an Emergency Use Authorization (EUA). This EUA will remain  in effect (meaning this test can be used) for the duration of the COVID-19 declaration under Section 564(b)(1) of the Act, 21 U.S.C.section 360bbb-3(b)(1), unless the authorization is terminated  or revoked sooner.       Influenza A by PCR NEGATIVE NEGATIVE Final   Influenza B by PCR NEGATIVE NEGATIVE Final    Comment: (NOTE) The Xpert Xpress SARS-CoV-2/FLU/RSV plus assay is intended as an aid in the diagnosis of influenza from Nasopharyngeal swab specimens and should not be used as a sole basis for treatment. Nasal washings and aspirates are unacceptable for Xpert Xpress SARS-CoV-2/FLU/RSV testing.  Fact Sheet for Patients: EntrepreneurPulse.com.au  Fact Sheet for Healthcare Providers: IncredibleEmployment.be  This test is not yet approved or cleared by the [place] FDA and has been authorized for detection and/or diagnosis of SARS-CoV-2 by FDA under an Emergency Use Authorization (EUA). This EUA will remain in effect (meaning this test can be used) for the duration of the COVID-19 declaration under Section 564(b)(1) of the Act, 21 U.S.C. section 360bbb-3(b)(1), unless the authorization is terminated or revoked.  Performed at [facility], [address] [place], [place] [address]   MRSA Next Gen by PCR, Nasal     Status: Abnormal   Collection Time: 06/16/21  6:05 PM   Specimen: Nasal Mucosa; Nasal Swab  Result Value Ref Range Status   MRSA by PCR Next Gen DETECTED (A) NOT DETECTED Final    Comment: RESULT CALLED TO, READ BACK BY AND VERIFIED  WITH: MEGAN MCFURR AT 1920 ON 06/16/21 BY SS (NOTE) The GeneXpert MRSA Assay (FDA approved for NASAL specimens only), is one component of a comprehensive MRSA colonization surveillance program. It is not intended to diagnose MRSA infection nor to guide or monitor treatment for MRSA infections. Test performance is not FDA approved in patients less than 9 years old. Performed at [facility],  [place], [place] [address]     Coagulation Studies: No results for input(s): LABPROT, INR in the last 72 hours.  Urinalysis: No results for input(s): COLORURINE, LABSPEC, PHURINE, GLUCOSEU, HGBUR, BILIRUBINUR, KETONESUR, PROTEINUR, UROBILINOGEN, NITRITE, LEUKOCYTESUR in the last 72 hours.  Invalid input(s): APPERANCEUR     Imaging: No results found.   Medications:      (feeding supplement) PROSource Plus  30 mL Oral TID BM   atorvastatin  20 mg Oral QHS   Chlorhexidine Gluconate Cloth  6 each Topical Q0600   DULoxetine  30 mg Oral Daily   enoxaparin (LOVENOX) injection  40 mg Subcutaneous Q24H   feeding supplement  1 Container Oral TID BM   multivitamin with minerals  1 tablet Oral Daily   mupirocin ointment  1 application Nasal BID   nicotine  21 mg Transdermal Daily   nystatin  5 mL Mouth/Throat QID   pantoprazole  20 mg Oral Daily   sodium chloride flush  3 mL Intravenous Q12H   sodium chloride  2 g Oral BID WC   acetaminophen **OR** acetaminophen, albuterol, hydrALAZINE, ipratropium-albuterol, nicotine polacrilex, traMADol  Assessment/ Plan:  Mr. Deante Blough is a 71 y.o.  male Mr. Laquon Emel is a 71 y.o. white male with COPD with ongoing tobacco use, GERD, hypertension, , who was admitted to [facility] on 06/15/2021 for  Syncope [R55] Hyponatremia [E87.1]    Hyponatremia: hypoosmolar with hypovolemia. 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  - Sodium 128 this morning  - Continue prescribed salt tabs with fluid  restriction 1268mL day    Anemia: consistent with iron deficiency.    Lab Results  Component Value Date   HGB 9.8 (L) 06/18/2021     Hypertension: Holding home lisinopril and diltiazem. BP 134/72  Avoid Hypotension   LOS: 5 Shantelle Breeze 2/14/202312:24 PM    Patient was examined and evaluated with Colon Flattery, NP.  Plan of care was formulated and discussed with patient as well as NP.  I agree with the note as documented with edits.

## 2021-06-22 DIAGNOSIS — F17200 Nicotine dependence, unspecified, uncomplicated: Secondary | ICD-10-CM

## 2021-06-22 LAB — GLUCOSE, CAPILLARY
Glucose-Capillary: 95 mg/dL (ref 70–99)
Glucose-Capillary: 99 mg/dL (ref 70–99)

## 2021-06-22 MED ORDER — METHYLPREDNISOLONE SODIUM SUCC 125 MG IJ SOLR
60.0000 mg | Freq: Every day | INTRAMUSCULAR | Status: DC
  Administered 2021-06-22: 60 mg via INTRAVENOUS
  Filled 2021-06-22: qty 2

## 2021-06-22 MED ORDER — FUROSEMIDE 10 MG/ML IJ SOLN
20.0000 mg | Freq: Once | INTRAMUSCULAR | Status: AC
  Administered 2021-06-22: 20 mg via INTRAVENOUS
  Filled 2021-06-22: qty 4

## 2021-06-22 MED ORDER — MONTELUKAST SODIUM 10 MG PO TABS
10.0000 mg | ORAL_TABLET | Freq: Every day | ORAL | Status: DC
  Administered 2021-06-22 – 2021-06-23 (×2): 10 mg via ORAL
  Filled 2021-06-22 (×2): qty 1

## 2021-06-22 MED ORDER — MOMETASONE FURO-FORMOTEROL FUM 200-5 MCG/ACT IN AERO
2.0000 | INHALATION_SPRAY | Freq: Two times a day (BID) | RESPIRATORY_TRACT | Status: DC
  Administered 2021-06-22 – 2021-06-24 (×5): 2 via RESPIRATORY_TRACT
  Filled 2021-06-22: qty 8.8

## 2021-06-22 MED ORDER — IPRATROPIUM-ALBUTEROL 0.5-2.5 (3) MG/3ML IN SOLN
3.0000 mL | Freq: Three times a day (TID) | RESPIRATORY_TRACT | Status: DC
  Administered 2021-06-22 – 2021-06-24 (×6): 3 mL via RESPIRATORY_TRACT
  Filled 2021-06-22 (×6): qty 3

## 2021-06-22 MED ORDER — DILTIAZEM HCL 30 MG PO TABS
120.0000 mg | ORAL_TABLET | Freq: Two times a day (BID) | ORAL | Status: DC
  Administered 2021-06-22 – 2021-06-24 (×5): 120 mg via ORAL
  Filled 2021-06-22 (×5): qty 4

## 2021-06-22 NOTE — TOC Progression Note (Signed)
Transition of Care [facility]) - Progression Note    Patient Details  Name: Jeremiah [name] MRN: [id number] Date of Birth: 01-07-1951  Transition of Care [facility]) CM/SW Contact  Eileen Stanford, LCSW Phone Number: 06/22/2021, 10:23 AM  Clinical Narrative:  CSW spoke with pt's friend [name], and she is agreeable for pt to return home with services. Pt is active with [facility] and they are aware to resume services. Amongst conversation with both [name] and pt and he has decided he wants EMS transport home. MD notified.          Expected Discharge Plan and Services                                                 Social Determinants of Health (SDOH) Interventions    Readmission Risk Interventions Readmission Risk Prevention Plan 10/21/2020 09/27/2020 06/17/2020  Transportation Screening Complete Complete Complete  PCP or Specialist Appt within 3-5 Days Complete Complete Complete  HRI or Home Care Consult Complete Complete Complete  Social Work Consult for [place] Planning/Counseling Complete Not Complete Complete  SW consult not completed comments - RNCM assigned to patient -  Palliative Care Screening Not Applicable Not Applicable Not Applicable  Medication Review (RN Care Manager) Complete Complete Complete  Some recent data might be hidden

## 2021-06-22 NOTE — Plan of Care (Signed)

## 2021-06-22 NOTE — Progress Notes (Signed)
PROGRESS NOTE    Jeremiah [name]  LEX:[id number] DOB: 04-21-1951 DOA: 06/15/2021 PCP: [place] [place]    Chief Complaint  Patient presents with   Loss of Consciousness    Brief Narrative:  Taken from H&P.  71 year old male with history of GERD, depression, anxiety, hyperlipidemia, tobacco dependence, who presents to the emergency department for chief concerns of confusion/Loss of consciousness. He was hemodynamically stable on arrival, oxygen requirement remained at his baseline of 4 L. Labs pertinent for worsening hyponatremia at 117.  BNP and troponin was within normal limit. Also found to have mild leukocytosis, TSH within normal limit. Also found to have oral thrush with decreased p.o. intake secondary to oral pain for about a month.   Patient was later transferred to ICU for hypertonic saline due to worsening sodium at 114. Slowly improving with hypertonic saline at 30 mL/h-nephrology is on board.   2/12: Sodium improved to 127.  Hypertonic saline was discontinued and he was restarted on his salt tablets.  PT/OT ordered for discharge planning.   2/13: Sodium continue to improve, at 129 today.  PT/OT is recommending SNF   2/14: Patient does not have any rehab days left, unable to pay out-of-pocket.  He agrees to go home with home health services but need permission from a friend called [name] as he lives with her.  Unable to reach her by me and by TOC even after multiple attempts. Medically stable for discharge     Assessment & Plan:   Principal Problem:   Syncope Active Problems:   Tobacco dependency   COPD, moderate (HCC)   Nicotine dependence   Hyponatremia   HTN (hypertension)   GERD (gastroesophageal reflux disease)   Thrush, oral   Unintentional weight loss   Pressure injury of skin   Dehydration   Protein-calorie malnutrition, severe  * Syncope- (present on admission) Most likely multifactorial with poor p.o. intake, taking several  antihypertensives and hyponatremia.  Repeat echocardiogram with normal EF and grade 1 diastolic dysfunction.  No regional wall motion abnormalities. -Encourage p.o. hydration -Blood pressure improved.   -Resume home regimen Cardizem. -Follow.   Hypovolemic hyponatremia- (present on admission) 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  Current labs consistent with hyposmolar, hypovolemic hyponatremia Nephrology was consulted- Patient was transferred to ICU for hypertonic saline and received for 2 days. Sodium improving and was 128 yesterday (06/21/2021). -Continue salt tablets.   -Patient with some bilateral upper extremity swelling we will give a dose of Lasix 20 mg IV x1.   -Repeat labs in the a.m.   -Continue 1200 cc/day fluid restriction.   -Nephrology following and appreciate input and recommendations.     Thrush, oral- (present on admission) Improving, no difficulty swallowing. -Continue with nystatin swishes -Soft chopped diet   HTN (hypertension)- (present on admission) Blood pressure within goal.  Home antihypertensives which include diltiazem 120 mg twice daily and lisinopril 10 mg was held. -Blood pressure improved. -Resume home regimen diltiazem. -Continue to hold lisinopril. -Hydralazine as needed.   COPD, moderate ([place])- (present on admission)/shortness of breath -Patient on baseline O2 requirements of 4 L.   -Patient with some complaints of worsening shortness of breath from baseline today.   -Solu-Medrol 60 mg IV x1, then prednisone 40 mg x 3 days.   -Placed on Dulera.   -Placed on scheduled DuoNebs. -Continue supplemental oxygen. -Give a dose of Lasix 20 mg IV x1.   Tobacco dependency- (present on admission) -Nicotine patch.  Unintentional weight loss- (present on admission) Patient with poor p.o. intake with oral thrush. -Will need outpatient work-up by PCP   Protein-calorie malnutrition, severe Estimated body mass index is 22.45 kg/m as  calculated from the following:   Height as of this encounter: 6' (1.829 m).   Weight as of this encounter: 75.1 kg.  -Dietitian consult   Pressure injury of skin- (present on admission) Pressure Injury 06/15/21 Coccyx Medial Stage 2 -  Partial thickness loss of dermis presenting as a shallow open injury with a red, pink wound bed without slough. pink, first layer gone (Active)  06/15/21 0800  Location: Coccyx  Location Orientation: Medial  Staging: Stage 2 -  Partial thickness loss of dermis presenting as a shallow open injury with a red, pink wound bed without slough.  Wound Description (Comments): pink, first layer gone  Present on Admission: Yes     Pressure Injury 06/16/21 Buttocks Right Stage 2 -  Partial thickness loss of dermis presenting as a shallow open injury with a red, pink wound bed without slough. (Active)  06/16/21 2322  Location: Buttocks  Location Orientation: Right  Staging: Stage 2 -  Partial thickness loss of dermis presenting as a shallow open injury with a red, pink wound bed without slough.  Wound Description (Comments):   Present on Admission: Yes     DVT prophylaxis: Lovenox Code Status: Full Family Communication: Updated patient.  No family at bedside. Disposition:   Status is: Inpatient Remains inpatient appropriate because: Severity of illness           Consultants:  Nephrology: Dr.Kolluru 06/16/2021  Procedures:  CT head 06/15/2021 Chest x-ray 06/15/2021 2D echo 06/15/2021     Antimicrobials:  None   Subjective: Laying in bed.  Arousable.  States shortness of breath a little bit worse from his baseline.  States he desats on ambulation.  Denies any chest pain.  No abdominal pain.  Objective: Vitals:   06/22/21 0433 06/22/21 0500 06/22/21 0915 06/22/21 0917  BP: 114/60  126/79 126/79  Pulse: 78  83 79  Resp: 18  16 16   Temp:   98.8 F (37.1 C) 98.8 F (37.1 C)  TempSrc:      SpO2: 99%  99% 99%  Weight:  75.3 kg    Height:         Intake/Output Summary (Last 24 hours) at 06/22/2021 1042 Last data filed at 06/22/2021 1020 Gross per 24 hour  Intake 963 ml  Output 1500 ml  Net -537 ml   Filed Weights   06/16/21 1800 06/17/21 0409 06/22/21 0500  Weight: 73.9 kg 75.1 kg 75.3 kg    Examination:  General exam: Appears calm and comfortable  Respiratory system: Some bibasilar crackles.  No wheezing noted.  Fair air movement.  No use of accessory muscles of respiration.  Speaking in full sentences.  Cardiovascular system: S1 & S2 heard, RRR. No JVD, murmurs, rubs, gallops or clicks. No pedal edema. Gastrointestinal system: Abdomen is nondistended, soft and nontender. No organomegaly or masses felt. Normal bowel sounds heard. Central nervous system: Alert and oriented. No focal neurological deficits. Extremities: Bilateral upper extremities edematous.  Symmetric 5 x 5 power. Skin: No rashes, lesions or ulcers Psychiatry: Judgement and insight appear normal. Mood & affect appropriate.     Data Reviewed: I have personally reviewed following labs and imaging studies  CBC: Recent Labs  Lab 06/15/21 1511 06/16/21 0530 06/18/21 0507  WBC 13.9* 14.7* 15.4*  HGB 9.7* 9.4* 9.8*  HCT  28.7* 26.8* 28.6*  MCV 80.8 78.6* 82.2  PLT 351 335 258    Basic Metabolic Panel: Recent Labs  Lab 06/15/21 1511 06/15/21 2042 06/16/21 0530 06/16/21 1441 06/18/21 0507 06/18/21 1049 06/19/21 1822 06/20/21 0429 06/20/21 0810 06/20/21 1914 06/21/21 0814  NA 117*  --  117*   < > 120*   < > 126* 129* 126* 127* 128*  K 4.4  --  4.2  --  3.8  --   --  4.0  --   --  4.2  CL 83*  --  85*  --  90*  --   --  94*  --   --  90*  CO2 27  --  27  --  27  --   --  27  --   --  32  GLUCOSE 101*  --  96  --  109*  --   --  123*  --   --  74  BUN 16  --  15  --  19  --   --  20  --   --  14  CREATININE 0.45*  --  0.31*  --  <0.30*  --   --  0.30*  --   --  <0.30*  CALCIUM 8.2*  --  7.8*  --  7.6*  --   --  7.7*  --   --  7.9*  MG  --   1.7  --   --   --   --   --   --   --   --   --   PHOS  --  2.9  --   --   --   --   --   --   --   --   --    < > = values in this interval not displayed.    GFR: CrCl cannot be calculated (This lab value cannot be used to calculate CrCl because it is not a number: <0.30).  Liver Function Tests: Recent Labs  Lab 06/16/21 0530  AST 20  ALT 28  ALKPHOS 106  BILITOT 0.8  PROT 5.3*  ALBUMIN 2.6*    CBG: Recent Labs  Lab 06/19/21 0527 06/20/21 0508 06/21/21 0440 06/22/21 0525 06/22/21 0527  GLUCAP 102* 116* 113* 99 95     Recent Results (from the past 240 hour(s))  Resp Panel by RT-PCR (Flu A&B, Covid) Nasopharyngeal Swab     Status: None   Collection Time: 06/15/21  7:16 PM   Specimen: Nasopharyngeal Swab; Nasopharyngeal(NP) swabs in vial transport medium  Result Value Ref Range Status   SARS Coronavirus 2 by RT PCR NEGATIVE NEGATIVE Final    Comment: (NOTE) SARS-CoV-2 target nucleic acids are NOT DETECTED.  The SARS-CoV-2 RNA is generally detectable in upper respiratory specimens during the acute phase of infection. The lowest concentration of SARS-CoV-2 viral copies this assay can detect is 138 copies/mL. A negative result does not preclude SARS-Cov-2 infection and should not be used as the sole basis for treatment or other patient management decisions. A negative result may occur with  improper specimen collection/handling, submission of specimen other than nasopharyngeal swab, presence of viral mutation(s) within the areas targeted by this assay, and inadequate number of viral copies(<138 copies/mL). A negative result must be combined with clinical observations, patient history, and epidemiological information. The expected result is Negative.  Fact Sheet for Patients:  EntrepreneurPulse.com.au  Fact Sheet for Healthcare Providers:  IncredibleEmployment.be  This test is no  t yet approved or cleared by the [place] and  has been authorized for detection and/or diagnosis of SARS-CoV-2 by FDA under an Emergency Use Authorization (EUA). This EUA will remain  in effect (meaning this test can be used) for the duration of the COVID-19 declaration under Section 564(b)(1) of the Act, 21 U.S.C.section 360bbb-3(b)(1), unless the authorization is terminated  or revoked sooner.       Influenza A by PCR NEGATIVE NEGATIVE Final   Influenza B by PCR NEGATIVE NEGATIVE Final    Comment: (NOTE) The Xpert Xpress SARS-CoV-2/FLU/RSV plus assay is intended as an aid in the diagnosis of influenza from Nasopharyngeal swab specimens and should not be used as a sole basis for treatment. Nasal washings and aspirates are unacceptable for Xpert Xpress SARS-CoV-2/FLU/RSV testing.  Fact Sheet for Patients: EntrepreneurPulse.com.au  Fact Sheet for Healthcare Providers: IncredibleEmployment.be  This test is not yet approved or cleared by the [place] FDA and has been authorized for detection and/or diagnosis of SARS-CoV-2 by FDA under an Emergency Use Authorization (EUA). This EUA will remain in effect (meaning this test can be used) for the duration of the COVID-19 declaration under Section 564(b)(1) of the Act, 21 U.S.C. section 360bbb-3(b)(1), unless the authorization is terminated or revoked.  Performed at [facility], [address] [place], [place] [address]   MRSA Next Gen by PCR, Nasal     Status: Abnormal   Collection Time: 06/16/21  6:05 PM   Specimen: Nasal Mucosa; Nasal Swab  Result Value Ref Range Status   MRSA by PCR Next Gen DETECTED (A) NOT DETECTED Final    Comment: RESULT CALLED TO, READ BACK BY AND VERIFIED WITH: MEGAN MCFURR AT 1920 ON 06/16/21 BY SS (NOTE) The GeneXpert MRSA Assay (FDA approved for NASAL specimens only), is one component of a comprehensive MRSA colonization surveillance program. It is not intended to diagnose MRSA infection  nor to guide or monitor treatment for MRSA infections. Test performance is not FDA approved in patients less than 51 years old. Performed at [facility], [address], [place], [place] [address]          Radiology Studies: No results found.      Scheduled Meds:  (feeding supplement) PROSource Plus  30 mL Oral TID BM   atorvastatin  20 mg Oral QHS   Chlorhexidine Gluconate Cloth  6 each Topical Q0600   DULoxetine  30 mg Oral Daily   enoxaparin (LOVENOX) injection  40 mg Subcutaneous Q24H   feeding supplement  1 Container Oral TID BM   methylPREDNISolone (SOLU-MEDROL) injection  60 mg Intravenous Daily   mometasone-formoterol  2 puff Inhalation BID   multivitamin with minerals  1 tablet Oral Daily   mupirocin ointment  1 application Nasal BID   nicotine  21 mg Transdermal Daily   nystatin  5 mL Mouth/Throat QID   pantoprazole  20 mg Oral Daily   sodium chloride flush  3 mL Intravenous Q12H   sodium chloride  2 g Oral BID WC   Continuous Infusions:   LOS: 6 days    Time spent: 40 minutes    Irine Seal, MD [facility]   To contact the attending provider between 7A-7P or the covering provider during after hours 7P-7A, please log into the web site [email or web address] and access using universal [place] password for that web site. If you do not have the password, please call the hospital operator.  06/22/2021, 10:42 AM

## 2021-06-22 NOTE — TOC Progression Note (Signed)
Transition of Care [facility]) - Progression Note    Patient Details  Name: Jeremiah [name] MRN: [id number] Date of Birth: Jun 26, 1950  Transition of Care [facility]) CM/SW Contact  Jeremiah Stanford, LCSW Phone Number: 06/22/2021, 2:49 PM  Clinical Narrative:   Per MD pt will dc home tomorrow. HH arranged through [facility] EMS will be arranged at dc. Pt's friend Jeremiah [name] has been updated multiple times.          Expected Discharge Plan and Services                                                 Social Determinants of Health (SDOH) Interventions    Readmission Risk Interventions Readmission Risk Prevention Plan 10/21/2020 09/27/2020 06/17/2020  Transportation Screening Complete Complete Complete  PCP or Specialist Appt within 3-5 Days Complete Complete Complete  HRI or Home Care Consult Complete Complete Complete  Social Work Consult for [place] Planning/Counseling Complete Not Complete Complete  SW consult not completed comments - RNCM assigned to patient -  Palliative Care Screening Not Applicable Not Applicable Not Applicable  Medication Review (RN Care Manager) Complete Complete Complete  Some recent data might be hidden

## 2021-06-22 NOTE — Progress Notes (Signed)
[facility]  ROUNDING NOTE   Subjective:   Patient sitting up in bed Alert and oriented Tolerating meals, denies nausea and vomiting Denies shortness of breath Peripheral edema in upper extremities  Objective:  Vital signs in last 24 hours:  Temp:  [98.3 F (36.8 C)-98.8 F (37.1 C)] 98.8 F (37.1 C) (02/15 0917) Pulse Rate:  [78-94] 79 (02/15 0917) Resp:  [16-18] 16 (02/15 0917) BP: (114-136)/(60-79) 126/79 (02/15 0917) SpO2:  [94 %-99 %] 99 % (02/15 0917) Weight:  [75.3 kg] 75.3 kg (02/15 0500)  Weight change:  Filed Weights   06/16/21 1800 06/17/21 0409 06/22/21 0500  Weight: 73.9 kg 75.1 kg 75.3 kg    Intake/Output: I/O last 3 completed shifts: In: 963 [P.O.:960; I.V.:3] Out: 1750 [Urine:1750]   Intake/Output this shift:  Total I/O In: 480 [P.O.:480] Out: 450 [Urine:450]  Physical Exam: General: NAD, resting in bed  Head: Normocephalic, atraumatic. Moist oral mucosal membranes, No thrush  Eyes: Anicteric  Lungs:  Clear to auscultation, normal effort  Heart: Regular rate and rhythm  Abdomen:  Soft, nontender  Extremities: 1+ peripheral edema.  Neurologic: Nonfocal, moving all four extremities  Skin: No lesions        Basic Metabolic Panel: Recent Labs  Lab 06/15/21 1511 06/15/21 2042 06/16/21 0530 06/16/21 1441 06/18/21 0507 06/18/21 1049 06/19/21 1822 06/20/21 0429 06/20/21 0810 06/20/21 1914 06/21/21 0814  NA 117*  --  117*   < > 120*   < > 126* 129* 126* 127* 128*  K 4.4  --  4.2  --  3.8  --   --  4.0  --   --  4.2  CL 83*  --  85*  --  90*  --   --  94*  --   --  90*  CO2 27  --  27  --  27  --   --  27  --   --  32  GLUCOSE 101*  --  96  --  109*  --   --  123*  --   --  74  BUN 16  --  15  --  19  --   --  20  --   --  14  CREATININE 0.45*  --  0.31*  --  <0.30*  --   --  0.30*  --   --  <0.30*  CALCIUM 8.2*  --  7.8*  --  7.6*  --   --  7.7*  --   --  7.9*  MG  --  1.7  --   --   --   --   --   --   --   --   --    PHOS  --  2.9  --   --   --   --   --   --   --   --   --    < > = values in this interval not displayed.     Liver Function Tests: Recent Labs  Lab 06/16/21 0530  AST 20  ALT 28  ALKPHOS 106  BILITOT 0.8  PROT 5.3*  ALBUMIN 2.6*    No results for input(s): LIPASE, AMYLASE in the last 168 hours. No results for input(s): AMMONIA in the last 168 hours.  CBC: Recent Labs  Lab 06/15/21 1511 06/16/21 0530 06/18/21 0507  WBC 13.9* 14.7* 15.4*  HGB 9.7* 9.4* 9.8*  HCT 28.7* 26.8* 28.6*  MCV 80.8 78.6* 82.2  PLT 351 335 305     Cardiac Enzymes: No results for input(s): CKTOTAL, CKMB, CKMBINDEX, TROPONINI in the last 168 hours.  BNP: Invalid input(s): POCBNP  CBG: Recent Labs  Lab 06/19/21 0527 06/20/21 0508 06/21/21 0440 06/22/21 0525 06/22/21 0527  GLUCAP 102* 116* 113* 99 95     Microbiology: Results for orders placed or performed during the hospital encounter of 06/15/21  Resp Panel by RT-PCR (Flu A&B, Covid) Nasopharyngeal Swab     Status: None   Collection Time: 06/15/21  7:16 PM   Specimen: Nasopharyngeal Swab; Nasopharyngeal(NP) swabs in vial transport medium  Result Value Ref Range Status   SARS Coronavirus 2 by RT PCR NEGATIVE NEGATIVE Final    Comment: (NOTE) SARS-CoV-2 target nucleic acids are NOT DETECTED.  The SARS-CoV-2 RNA is generally detectable in upper respiratory specimens during the acute phase of infection. The lowest concentration of SARS-CoV-2 viral copies this assay can detect is 138 copies/mL. A negative result does not preclude SARS-Cov-2 infection and should not be used as the sole basis for treatment or other patient management decisions. A negative result may occur with  improper specimen collection/handling, submission of specimen other than nasopharyngeal swab, presence of viral mutation(s) within the areas targeted by this assay, and inadequate number of viral copies(<138 copies/mL). A negative result must be combined  with clinical observations, patient history, and epidemiological information. The expected result is Negative.  Fact Sheet for Patients:  EntrepreneurPulse.com.au  Fact Sheet for Healthcare Providers:  IncredibleEmployment.be  This test is no t yet approved or cleared by the [place] FDA and  has been authorized for detection and/or diagnosis of SARS-CoV-2 by FDA under an Emergency Use Authorization (EUA). This EUA will remain  in effect (meaning this test can be used) for the duration of the COVID-19 declaration under Section 564(b)(1) of the Act, 21 U.S.C.section 360bbb-3(b)(1), unless the authorization is terminated  or revoked sooner.       Influenza A by PCR NEGATIVE NEGATIVE Final   Influenza B by PCR NEGATIVE NEGATIVE Final    Comment: (NOTE) The Xpert Xpress SARS-CoV-2/FLU/RSV plus assay is intended as an aid in the diagnosis of influenza from Nasopharyngeal swab specimens and should not be used as a sole basis for treatment. Nasal washings and aspirates are unacceptable for Xpert Xpress SARS-CoV-2/FLU/RSV testing.  Fact Sheet for Patients: EntrepreneurPulse.com.au  Fact Sheet for Healthcare Providers: IncredibleEmployment.be  This test is not yet approved or cleared by the [place] FDA and has been authorized for detection and/or diagnosis of SARS-CoV-2 by FDA under an Emergency Use Authorization (EUA). This EUA will remain in effect (meaning this test can be used) for the duration of the COVID-19 declaration under Section 564(b)(1) of the Act, 21 U.S.C. section 360bbb-3(b)(1), unless the authorization is terminated or revoked.  Performed at [facility], [address] [place], [place] [address]   MRSA Next Gen by PCR, Nasal     Status: Abnormal   Collection Time: 06/16/21  6:05 PM   Specimen: Nasal Mucosa; Nasal Swab  Result Value Ref Range Status   MRSA by PCR Next  Gen DETECTED (A) NOT DETECTED Final    Comment: RESULT CALLED TO, READ BACK BY AND VERIFIED WITH: MEGAN MCFURR AT 1920 ON 06/16/21 BY SS (NOTE) The GeneXpert MRSA Assay (FDA approved for NASAL specimens only), is one component of a comprehensive MRSA colonization surveillance program. It is not intended to diagnose MRSA infection nor to guide or monitor treatment for MRSA infections. Test  performance is not FDA approved in patients less than 69 years old. Performed at [facility], [address] [place],  [address]     Coagulation Studies: No results for input(s): LABPROT, INR in the last 72 hours.  Urinalysis: No results for input(s): COLORURINE, LABSPEC, PHURINE, GLUCOSEU, HGBUR, BILIRUBINUR, KETONESUR, PROTEINUR, UROBILINOGEN, NITRITE, LEUKOCYTESUR in the last 72 hours.  Invalid input(s): APPERANCEUR     Imaging: No results found.   Medications:      (feeding supplement) PROSource Plus  30 mL Oral TID BM   atorvastatin  20 mg Oral QHS   Chlorhexidine Gluconate Cloth  6 each Topical Q0600   DULoxetine  30 mg Oral Daily   enoxaparin (LOVENOX) injection  40 mg Subcutaneous Q24H   feeding supplement  1 Container Oral TID BM   methylPREDNISolone (SOLU-MEDROL) injection  60 mg Intravenous Daily   mometasone-formoterol  2 puff Inhalation BID   multivitamin with minerals  1 tablet Oral Daily   mupirocin ointment  1 application Nasal BID   nicotine  21 mg Transdermal Daily   nystatin  5 mL Mouth/Throat QID   pantoprazole  20 mg Oral Daily   sodium chloride flush  3 mL Intravenous Q12H   sodium chloride  2 g Oral BID WC   acetaminophen **OR** acetaminophen, albuterol, hydrALAZINE, ipratropium-albuterol, nicotine polacrilex, traMADol  Assessment/ Plan:  Jeremiah [name] is a 71 y.o.  male Mr. Tristram Milian is a 71 y.o. white male with COPD with ongoing tobacco use, GERD, hypertension, , who was admitted to [facility] on 06/15/2021 for  Syncope  [R55] Hyponatremia [E87.1]    Hyponatremia: hypoosmolar with hypovolemia. 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  - Sodium 128 yesterday - Continue prescribed salt tabs with fluid restriction 1252mL day - mildly increased edema in upper extremities, recommend Furosemide 10mg  as needed to manage.     Anemia: consistent with iron deficiency.    Lab Results  Component Value Date   HGB 9.8 (L) 06/18/2021    Hgb within acceptable range  Hypertension: Holding home lisinopril and diltiazem. BP 140/79  Avoid Hypotension   LOS: 6   2/15/202312:29 PM

## 2021-06-23 LAB — RENAL FUNCTION PANEL
Albumin: 2.3 g/dL — ABNORMAL LOW (ref 3.5–5.0)
Anion gap: 5 (ref 5–15)
BUN: 19 mg/dL (ref 8–23)
CO2: 33 mmol/L — ABNORMAL HIGH (ref 22–32)
Calcium: 8.2 mg/dL — ABNORMAL LOW (ref 8.9–10.3)
Chloride: 90 mmol/L — ABNORMAL LOW (ref 98–111)
Creatinine, Ser: 0.47 mg/dL — ABNORMAL LOW (ref 0.61–1.24)
GFR, Estimated: >60 mL/min (ref >60)
Glucose, Bld: 165 mg/dL — ABNORMAL HIGH (ref 70–99)
Phosphorus: 2.8 mg/dL (ref 2.5–4.6)
Potassium: 4.4 mmol/L (ref 3.5–5.1)
Sodium: 128 mmol/L — ABNORMAL LOW (ref 135–145)

## 2021-06-23 LAB — GLUCOSE, CAPILLARY: Glucose-Capillary: 195 mg/dL — ABNORMAL HIGH (ref 70–99)

## 2021-06-23 MED ORDER — FUROSEMIDE 10 MG/ML IJ SOLN
20.0000 mg | Freq: Once | INTRAMUSCULAR | Status: AC
  Administered 2021-06-23: 20 mg via INTRAVENOUS
  Filled 2021-06-23: qty 4

## 2021-06-23 MED ORDER — ALBUMIN HUMAN 25 % IV SOLN
25.0000 g | Freq: Once | INTRAVENOUS | Status: AC
  Administered 2021-06-23: 25 g via INTRAVENOUS
  Filled 2021-06-23: qty 100

## 2021-06-23 MED ORDER — PREDNISONE 20 MG PO TABS
40.0000 mg | ORAL_TABLET | Freq: Every day | ORAL | Status: DC
  Administered 2021-06-23 – 2021-06-24 (×2): 40 mg via ORAL
  Filled 2021-06-23 (×2): qty 2

## 2021-06-23 NOTE — Plan of Care (Signed)

## 2021-06-23 NOTE — Progress Notes (Signed)
PT Cancellation Note  Patient Details Name: Jeremiah [name] MRN: [id number] DOB: 10-05-1950   Cancelled Treatment:    Reason Eval/Treat Not Completed: Other (comment)  Offered and encouraged participation.  Pt stated he was waiting to hear if he is going home today but also possible IV lasix.  Encouraged gait while waiting but he was firm in wanting to know plan before proceeding.  Pt was able to walk 15' with sats dec to 77% on Tuesday.  Pt encouraged to walk again with me now to check sats and activity tolerance along with any further DME needs.  He stated he would be ok once he recovered.  Again remained firm in refusal.  Will offer again tomorrow if not discharged home today.   Chesley Noon 06/23/2021, 3:08 PM

## 2021-06-23 NOTE — Progress Notes (Signed)
PROGRESS NOTE    Jeremiah [name]  GYJ:[id number] DOB: 07/03/1950 DOA: 06/15/2021 PCP: [facility]    Chief Complaint  Patient presents with   Loss of Consciousness    Brief Narrative:  Taken from H&P.  71 year old male with history of GERD, depression, anxiety, hyperlipidemia, tobacco dependence, who presents to the emergency department for chief concerns of confusion/Loss of consciousness. He was hemodynamically stable on arrival, oxygen requirement remained at his baseline of 4 L. Labs pertinent for worsening hyponatremia at 117.  BNP and troponin was within normal limit. Also found to have mild leukocytosis, TSH within normal limit. Also found to have oral thrush with decreased p.o. intake secondary to oral pain for about a month.   Patient was later transferred to ICU for hypertonic saline due to worsening sodium at 114. Slowly improving with hypertonic saline at 30 mL/h-nephrology is on board.   2/12: Sodium improved to 127.  Hypertonic saline was discontinued and he was restarted on his salt tablets.  PT/OT ordered for discharge planning.   2/13: Sodium continue to improve, at 129 today.  PT/OT is recommending SNF   2/14: Patient does not have any rehab days left, unable to pay out-of-pocket.  He agrees to go home with home health services but need permission from a friend called [name] as he lives with her.  Unable to reach her by me and by TOC even after multiple attempts. Medically stable for discharge     Assessment & Plan:   Principal Problem:   Syncope Active Problems:   Tobacco dependency   COPD, moderate (HCC)   Nicotine dependence   Hyponatremia   HTN (hypertension)   GERD (gastroesophageal reflux disease)   Thrush, oral   Unintentional weight loss   Pressure injury of skin   Dehydration   Protein-calorie malnutrition, severe  * Syncope- (present on admission) Most likely multifactorial with poor p.o. intake, taking several  antihypertensives and hyponatremia.  Repeat echocardiogram with normal EF and grade 1 diastolic dysfunction.  No regional wall motion abnormalities. -Encourage p.o. hydration -Blood pressure improved.   -Resumed home regimen Cardizem. -Follow.   Hypovolemic hyponatremia- (present on admission) 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  Current labs consistent with hyposmolar, hypovolemic hyponatremia Nephrology was consulted- Patient was transferred to ICU for hypertonic saline and received for 2 days. Sodium improving and seems to be stabilizing currently at 128. -Continue salt tablets.   -Patient with some bilateral upper extremity swelling and received a dose of Lasix 20 mg IV x1 on 06/22/2021 with a urine output of 2.1 L.   -We will give a dose of IV albumin followed by another dose of Lasix 20 mg IV x1.  -Repeat labs in the a.m.   -Continue 1200 cc/day fluid restriction.   -Nephrology following and appreciate input and recommendations.     Thrush, oral- (present on admission) Improving, no difficulty swallowing. -Continue with nystatin swishes -Soft chopped diet   HTN (hypertension)- (present on admission) Blood pressure within goal.  Home antihypertensives which include diltiazem 120 mg twice daily and lisinopril 10 mg was held. -Blood pressure improved. -Diltiazem resumed.   -Continue to hold lisinopril.   -Hydralazine as needed.   COPD, moderate ([place])- (present on admission)/shortness of breath -Patient on baseline O2 requirements of 4 L.   -Patient with some complaints of worsening shortness of breath from baseline on 06/22/2021.  -Some improvement with shortness of breath today after diuretics and steroids and scheduled nebulizer treatments.  -  Transition from IV Solu-Medrol to oral prednisone 40 mg daily x3 days. -Continue Dulera, scheduled DuoNebs, supplemental oxygen. -Patient received a dose of Lasix 20 mg IV x1 on 06/22/2021 with a urine output of 2.1 L  recorded. -We will give a dose of IV albumin followed by Lasix 20 mg IV x1 as patient also still with some edematous extremities. -Supportive care.   Tobacco dependency- (present on admission) -Nicotine patch.     Unintentional weight loss- (present on admission) Patient with poor p.o. intake with oral thrush. -Continue nystatin swish and swallow. -Will need outpatient work-up by PCP   Protein-calorie malnutrition, severe Estimated body mass index is 22.45 kg/m as calculated from the following:   Height as of this encounter: 6' (1.829 m).   Weight as of this encounter: 75.1 kg.  -IV albumin x1. -Dietitian consult   Pressure injury of skin- (present on admission) Pressure Injury 06/15/21 Coccyx Medial Stage 2 -  Partial thickness loss of dermis presenting as a shallow open injury with a red, pink wound bed without slough. pink, first layer gone (Active)  06/15/21 0800  Location: Coccyx  Location Orientation: Medial  Staging: Stage 2 -  Partial thickness loss of dermis presenting as a shallow open injury with a red, pink wound bed without slough.  Wound Description (Comments): pink, first layer gone  Present on Admission: Yes     Pressure Injury 06/16/21 Buttocks Right Stage 2 -  Partial thickness loss of dermis presenting as a shallow open injury with a red, pink wound bed without slough. (Active)  06/16/21 2322  Location: Buttocks  Location Orientation: Right  Staging: Stage 2 -  Partial thickness loss of dermis presenting as a shallow open injury with a red, pink wound bed without slough.  Wound Description (Comments):   Present on Admission: Yes     DVT prophylaxis: Lovenox Code Status: Full Family Communication: Updated patient.  No family at bedside. Disposition:   Status is: Inpatient Remains inpatient appropriate because: Severity of illness           Consultants:  Nephrology: Dr.Kolluru 06/16/2021  Procedures:  CT head 06/15/2021 Chest x-ray 06/15/2021 2D  echo 06/15/2021     Antimicrobials:  None   Subjective: Laying in bed.  Arousable.  Improvement with shortness of breath.  Stated he had significant urine output yesterday.  No chest pain.  No abdominal pain.  Overall feeling better than he was yesterday.   Objective: Vitals:   06/22/21 2342 06/23/21 0419 06/23/21 0750 06/23/21 1146  BP: (!) 129/57 (!) 122/53 (!) 112/56 (!) 113/58  Pulse: 77 73 70 68  Resp: 18 18 16 16   Temp: 98.2 F (36.8 C) 98.1 F (36.7 C) 98.1 F (36.7 C) 98.4 F (36.9 C)  TempSrc:      SpO2: 91% 95% 97% 98%  Weight:      Height:        Intake/Output Summary (Last 24 hours) at 06/23/2021 1202 Last data filed at 06/23/2021 0500 Gross per 24 hour  Intake 712 ml  Output 1651 ml  Net -939 ml    Filed Weights   06/16/21 1800 06/17/21 0409 06/22/21 0500  Weight: 73.9 kg 75.1 kg 75.3 kg    Examination:  General exam: NAD. Respiratory system: Decreased bibasilar crackles.  Fair air movement.  No wheezing noted.  No use of accessory muscles of respiration.  Speaking in full sentences.  Cardiovascular system: Regular rate rhythm no murmurs rubs or gallops.  No JVD.  No lower  extremity edema. Gastrointestinal system: Abdomen is nondistended, soft and nontender. No organomegaly or masses felt. Normal bowel sounds heard. Central nervous system: Alert and oriented. No focal neurological deficits. Extremities: Bilateral upper extremities with improvement with edema with less edema on the right upper extremity.  Left upper extremity edematous. Skin: No rashes, lesions or ulcers Psychiatry: Judgement and insight appear normal. Mood & affect appropriate.     Data Reviewed: I have personally reviewed following labs and imaging studies  CBC: Recent Labs  Lab 06/18/21 0507  WBC 15.4*  HGB 9.8*  HCT 28.6*  MCV 82.2  PLT 305     Basic Metabolic Panel: Recent Labs  Lab 06/18/21 0507 06/18/21 1049 06/20/21 0429 06/20/21 0810 06/20/21 1914  06/21/21 0814 06/23/21 0610  NA 120*   < > 129* 126* 127* 128* 128*  K 3.8  --  4.0  --   --  4.2 4.4  CL 90*  --  94*  --   --  90* 90*  CO2 27  --  27  --   --  32 33*  GLUCOSE 109*  --  123*  --   --  74 165*  BUN 19  --  20  --   --  14 19  CREATININE <0.30*  --  0.30*  --   --  <0.30* 0.47*  CALCIUM 7.6*  --  7.7*  --   --  7.9* 8.2*  PHOS  --   --   --   --   --   --  2.8   < > = values in this interval not displayed.     GFR: Estimated Creatinine Clearance: 91.5 mL/min (A) (by C-G formula based on SCr of 0.47 mg/dL (L)).  Liver Function Tests: Recent Labs  Lab 06/23/21 0610  ALBUMIN 2.3*     CBG: Recent Labs  Lab 06/20/21 0508 06/21/21 0440 06/22/21 0525 06/22/21 0527 06/23/21 0446  GLUCAP 116* 113* 99 95 195*      Recent Results (from the past 240 hour(s))  Resp Panel by RT-PCR (Flu A&B, Covid) Nasopharyngeal Swab     Status: None   Collection Time: 06/15/21  7:16 PM   Specimen: Nasopharyngeal Swab; Nasopharyngeal(NP) swabs in vial transport medium  Result Value Ref Range Status   SARS Coronavirus 2 by RT PCR NEGATIVE NEGATIVE Final    Comment: (NOTE) SARS-CoV-2 target nucleic acids are NOT DETECTED.  The SARS-CoV-2 RNA is generally detectable in upper respiratory specimens during the acute phase of infection. The lowest concentration of SARS-CoV-2 viral copies this assay can detect is 138 copies/mL. A negative result does not preclude SARS-Cov-2 infection and should not be used as the sole basis for treatment or other patient management decisions. A negative result may occur with  improper specimen collection/handling, submission of specimen other than nasopharyngeal swab, presence of viral mutation(s) within the areas targeted by this assay, and inadequate number of viral copies(<138 copies/mL). A negative result must be combined with clinical observations, patient history, and epidemiological information. The expected result is Negative.  Fact  Sheet for Patients:  EntrepreneurPulse.com.au  Fact Sheet for Healthcare Providers:  IncredibleEmployment.be  This test is no t yet approved or cleared by the [place] FDA and  has been authorized for detection and/or diagnosis of SARS-CoV-2 by FDA under an Emergency Use Authorization (EUA). This EUA will remain  in effect (meaning this test can be used) for the duration of the COVID-19 declaration under Section 564(b)(1) of the Act,  21 U.S.C.section 360bbb-3(b)(1), unless the authorization is terminated  or revoked sooner.       Influenza A by PCR NEGATIVE NEGATIVE Final   Influenza B by PCR NEGATIVE NEGATIVE Final    Comment: (NOTE) The Xpert Xpress SARS-CoV-2/FLU/RSV plus assay is intended as an aid in the diagnosis of influenza from Nasopharyngeal swab specimens and should not be used as a sole basis for treatment. Nasal washings and aspirates are unacceptable for Xpert Xpress SARS-CoV-2/FLU/RSV testing.  Fact Sheet for Patients: EntrepreneurPulse.com.au  Fact Sheet for Healthcare Providers: IncredibleEmployment.be  This test is not yet approved or cleared by the [place] FDA and has been authorized for detection and/or diagnosis of SARS-CoV-2 by FDA under an Emergency Use Authorization (EUA). This EUA will remain in effect (meaning this test can be used) for the duration of the COVID-19 declaration under Section 564(b)(1) of the Act, 21 U.S.C. section 360bbb-3(b)(1), unless the authorization is terminated or revoked.  Performed at [facility], [address] [place], [place] [address]   MRSA Next Gen by PCR, Nasal     Status: Abnormal   Collection Time: 06/16/21  6:05 PM   Specimen: Nasal Mucosa; Nasal Swab  Result Value Ref Range Status   MRSA by PCR Next Gen DETECTED (A) NOT DETECTED Final    Comment: RESULT CALLED TO, READ BACK BY AND VERIFIED WITH: MEGAN MCFURR AT 1920  ON 06/16/21 BY SS (NOTE) The GeneXpert MRSA Assay (FDA approved for NASAL specimens only), is one component of a comprehensive MRSA colonization surveillance program. It is not intended to diagnose MRSA infection nor to guide or monitor treatment for MRSA infections. Test performance is not FDA approved in patients less than 99 years old. Performed at [facility], [address] [place], [place] [address]           Radiology Studies: No results found.      Scheduled Meds:  (feeding supplement) PROSource Plus  30 mL Oral TID BM   atorvastatin  20 mg Oral QHS   Chlorhexidine Gluconate Cloth  6 each Topical Q0600   diltiazem  120 mg Oral BID   DULoxetine  30 mg Oral Daily   enoxaparin (LOVENOX) injection  40 mg Subcutaneous Q24H   feeding supplement  1 Container Oral TID BM   ipratropium-albuterol  3 mL Nebulization TID   mometasone-formoterol  2 puff Inhalation BID   montelukast  10 mg Oral QHS   multivitamin with minerals  1 tablet Oral Daily   mupirocin ointment  1 application Nasal BID   nicotine  21 mg Transdermal Daily   nystatin  5 mL Mouth/Throat QID   pantoprazole  20 mg Oral Daily   predniSONE  40 mg Oral QAC breakfast   sodium chloride flush  3 mL Intravenous Q12H   sodium chloride  2 g Oral BID WC   Continuous Infusions:   LOS: 7 days    Time spent: 40 minutes    Irine Seal, MD [facility]   To contact the attending provider between 7A-7P or the covering provider during after hours 7P-7A, please log into the web site [email or web address] and access using universal [place] password for that web site. If you do not have the password, please call the hospital operator.  06/23/2021, 12:02 PM

## 2021-06-23 NOTE — Progress Notes (Signed)
[facility]  ROUNDING NOTE   Subjective:   Patient sitting up in bed Alert and oriented States he feels a little better, stronger this morning States he feels more prepared for discharge today than yesterday Denies shortness of breath, remains on 4 L nasal cannula.  Sodium remains 128 Urine output recorded of 2.1 L in past 24 hours  Objective:  Vital signs in last 24 hours:  Temp:  [98 F (36.7 C)-98.3 F (36.8 C)] 98.1 F (36.7 C) (02/16 0750) Pulse Rate:  [69-88] 70 (02/16 0750) Resp:  [16-18] 16 (02/16 0750) BP: (111-140)/(53-79) 112/56 (02/16 0750) SpO2:  [91 %-97 %] 97 % (02/16 0750)  Weight change:  Filed Weights   06/16/21 1800 06/17/21 0409 06/22/21 0500  Weight: 73.9 kg 75.1 kg 75.3 kg    Intake/Output: I/O last 3 completed shifts: In: 1675 [P.O.:1672; I.V.:3] Out: 2951 [Urine:2950; Stool:1]   Intake/Output this shift:  No intake/output data recorded.  Physical Exam: General: NAD, resting in bed  Head: Normocephalic, atraumatic. Moist oral mucosal membranes, No thrush  Eyes: Anicteric  Lungs:  Clear to auscultation, normal effort  Heart: Regular rate and rhythm  Abdomen:  Soft, nontender  Extremities: 1+ peripheral edema.  Neurologic: Nonfocal, moving all four extremities  Skin: No lesions        Basic Metabolic Panel: Recent Labs  Lab 06/18/21 0507 06/18/21 1049 06/20/21 0429 06/20/21 0810 06/20/21 1914 06/21/21 0814 06/23/21 0610  NA 120*   < > 129* 126* 127* 128* 128*  K 3.8  --  4.0  --   --  4.2 4.4  CL 90*  --  94*  --   --  90* 90*  CO2 27  --  27  --   --  32 33*  GLUCOSE 109*  --  123*  --   --  74 165*  BUN 19  --  20  --   --  14 19  CREATININE <0.30*  --  0.30*  --   --  <0.30* 0.47*  CALCIUM 7.6*  --  7.7*  --   --  7.9* 8.2*  PHOS  --   --   --   --   --   --  2.8   < > = values in this interval not displayed.     Liver Function Tests: Recent Labs  Lab 06/23/21 0610  ALBUMIN 2.3*    No results for  input(s): LIPASE, AMYLASE in the last 168 hours. No results for input(s): AMMONIA in the last 168 hours.  CBC: Recent Labs  Lab 06/18/21 0507  WBC 15.4*  HGB 9.8*  HCT 28.6*  MCV 82.2  PLT 305     Cardiac Enzymes: No results for input(s): CKTOTAL, CKMB, CKMBINDEX, TROPONINI in the last 168 hours.  BNP: Invalid input(s): POCBNP  CBG: Recent Labs  Lab 06/20/21 0508 06/21/21 0440 06/22/21 0525 06/22/21 0527 06/23/21 0446  GLUCAP 116* 113* 99 95 195*     Microbiology: Results for orders placed or performed during the hospital encounter of 06/15/21  Resp Panel by RT-PCR (Flu A&B, Covid) Nasopharyngeal Swab     Status: None   Collection Time: 06/15/21  7:16 PM   Specimen: Nasopharyngeal Swab; Nasopharyngeal(NP) swabs in vial transport medium  Result Value Ref Range Status   SARS Coronavirus 2 by RT PCR NEGATIVE NEGATIVE Final    Comment: (NOTE) SARS-CoV-2 target nucleic acids are NOT DETECTED.  The SARS-CoV-2 RNA is generally detectable in upper respiratory specimens during  the acute phase of infection. The lowest concentration of SARS-CoV-2 viral copies this assay can detect is 138 copies/mL. A negative result does not preclude SARS-Cov-2 infection and should not be used as the sole basis for treatment or other patient management decisions. A negative result may occur with  improper specimen collection/handling, submission of specimen other than nasopharyngeal swab, presence of viral mutation(s) within the areas targeted by this assay, and inadequate number of viral copies(<138 copies/mL). A negative result must be combined with clinical observations, patient history, and epidemiological information. The expected result is Negative.  Fact Sheet for Patients:  EntrepreneurPulse.com.au  Fact Sheet for Healthcare Providers:  IncredibleEmployment.be  This test is no t yet approved or cleared by the [place] FDA and  has  been authorized for detection and/or diagnosis of SARS-CoV-2 by FDA under an Emergency Use Authorization (EUA). This EUA will remain  in effect (meaning this test can be used) for the duration of the COVID-19 declaration under Section 564(b)(1) of the Act, 21 U.S.C.section 360bbb-3(b)(1), unless the authorization is terminated  or revoked sooner.       Influenza A by PCR NEGATIVE NEGATIVE Final   Influenza B by PCR NEGATIVE NEGATIVE Final    Comment: (NOTE) The Xpert Xpress SARS-CoV-2/FLU/RSV plus assay is intended as an aid in the diagnosis of influenza from Nasopharyngeal swab specimens and should not be used as a sole basis for treatment. Nasal washings and aspirates are unacceptable for Xpert Xpress SARS-CoV-2/FLU/RSV testing.  Fact Sheet for Patients: EntrepreneurPulse.com.au  Fact Sheet for Healthcare Providers: IncredibleEmployment.be  This test is not yet approved or cleared by the [place] FDA and has been authorized for detection and/or diagnosis of SARS-CoV-2 by FDA under an Emergency Use Authorization (EUA). This EUA will remain in effect (meaning this test can be used) for the duration of the COVID-19 declaration under Section 564(b)(1) of the Act, 21 U.S.C. section 360bbb-3(b)(1), unless the authorization is terminated or revoked.  Performed at [facility], [address] [place], [place] [address]   MRSA Next Gen by PCR, Nasal     Status: Abnormal   Collection Time: 06/16/21  6:05 PM   Specimen: Nasal Mucosa; Nasal Swab  Result Value Ref Range Status   MRSA by PCR Next Gen DETECTED (A) NOT DETECTED Final    Comment: RESULT CALLED TO, READ BACK BY AND VERIFIED WITH: MEGAN MCFURR AT 1920 ON 06/16/21 BY SS (NOTE) The GeneXpert MRSA Assay (FDA approved for NASAL specimens only), is one component of a comprehensive MRSA colonization surveillance program. It is not intended to diagnose MRSA infection nor to  guide or monitor treatment for MRSA infections. Test performance is not FDA approved in patients less than 66 years old. Performed at [facility], [address] [place], [place] [address]     Coagulation Studies: No results for input(s): LABPROT, INR in the last 72 hours.  Urinalysis: No results for input(s): COLORURINE, LABSPEC, PHURINE, GLUCOSEU, HGBUR, BILIRUBINUR, KETONESUR, PROTEINUR, UROBILINOGEN, NITRITE, LEUKOCYTESUR in the last 72 hours.  Invalid input(s): APPERANCEUR     Imaging: No results found.   Medications:      (feeding supplement) PROSource Plus  30 mL Oral TID BM   atorvastatin  20 mg Oral QHS   Chlorhexidine Gluconate Cloth  6 each Topical Q0600   diltiazem  120 mg Oral BID   DULoxetine  30 mg Oral Daily   enoxaparin (LOVENOX) injection  40 mg Subcutaneous Q24H   feeding supplement  1 Container  Oral TID BM   ipratropium-albuterol  3 mL Nebulization TID   mometasone-formoterol  2 puff Inhalation BID   montelukast  10 mg Oral QHS   multivitamin with minerals  1 tablet Oral Daily   mupirocin ointment  1 application Nasal BID   nicotine  21 mg Transdermal Daily   nystatin  5 mL Mouth/Throat QID   pantoprazole  20 mg Oral Daily   predniSONE  40 mg Oral QAC breakfast   sodium chloride flush  3 mL Intravenous Q12H   sodium chloride  2 g Oral BID WC   acetaminophen **OR** acetaminophen, albuterol, hydrALAZINE, ipratropium-albuterol, nicotine polacrilex, traMADol  Assessment/ Plan:  Jeremiah [name] is a 71 y.o.  male Jeremiah [name] is a 71 y.o. white male with COPD with ongoing tobacco use, GERD, hypertension, , who was admitted to [facility] on 06/15/2021 for  Syncope [R55] Hyponatremia [E87.1]    Hyponatremia: hypoosmolar with hypovolemia. 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  - Sodium remains 128 baseline for this patient -Would recommend continuing salt tabs with fluid restriction 1238mL day -One-time  dose of furosemide 20 mg IV administered yesterday.  Adequate urine output recorded.  We will recommend furosemide 10 mg oral as needed at discharge to manage edema.    Anemia: consistent with iron deficiency.    Lab Results  Component Value Date   HGB 9.8 (L) 06/18/2021    Hgb within acceptable range  Hypertension: Holding home lisinopril and diltiazem. BP currently 112/56  Avoid Hypotension   LOS: 7   2/16/202311:30 AM

## 2021-06-24 LAB — BASIC METABOLIC PANEL
Anion gap: 4 — ABNORMAL LOW (ref 5–15)
BUN: 26 mg/dL — ABNORMAL HIGH (ref 8–23)
CO2: 36 mmol/L — ABNORMAL HIGH (ref 22–32)
Calcium: 8.6 mg/dL — ABNORMAL LOW (ref 8.9–10.3)
Chloride: 91 mmol/L — ABNORMAL LOW (ref 98–111)
Creatinine, Ser: 0.39 mg/dL — ABNORMAL LOW (ref 0.61–1.24)
GFR, Estimated: >60 mL/min (ref >60)
Glucose, Bld: 99 mg/dL (ref 70–99)
Potassium: 4.5 mmol/L (ref 3.5–5.1)
Sodium: 131 mmol/L — ABNORMAL LOW (ref 135–145)

## 2021-06-24 LAB — GLUCOSE, CAPILLARY: Glucose-Capillary: 110 mg/dL — ABNORMAL HIGH (ref 70–99)

## 2021-06-24 MED ORDER — PREDNISONE 20 MG PO TABS: 40.0000 mg | ORAL_TABLET | Freq: Every day | ORAL | 0 refills | Status: AC

## 2021-06-24 MED ORDER — FUROSEMIDE 20 MG PO TABS: 10.0000 mg | ORAL_TABLET | Freq: Every day | ORAL | 1 refills | Status: DC | PRN

## 2021-06-24 MED ORDER — SODIUM CHLORIDE 1 G PO TABS: 2.0000 g | ORAL_TABLET | Freq: Two times a day (BID) | ORAL | 3 refills | Status: DC

## 2021-06-24 MED ORDER — NYSTATIN 100000 UNIT/ML MT SUSP: 5.0000 mL | Freq: Four times a day (QID) | OROMUCOSAL | 1 refills | Status: AC

## 2021-06-24 NOTE — TOC Transition Note (Signed)
Transition of Care [facility]) - CM/SW Discharge Note   Patient Details  Name: Jeremiah [name] MRN: [id number] Date of Birth: 10/05/1950  [facility] CM/SW Contact:  Eileen Stanford, LCSW Phone Number: 06/24/2021, 3:45 PM   Clinical Narrative:   [facility] notified of dc. EMS set up. CSW attempted to reach [place] at home however unsuccessful.    Final next level of care: Home w Home Health Services Barriers to Discharge: No Barriers Identified   Patient Goals and CMS Choice        Discharge Placement                Patient to be transferred to facility by: [facility]   Patient and family notified of of transfer: 06/24/21  Discharge Plan and [place]: [facility] Date [place]: 06/24/21 Time [place]: 5400 Representative spoke with at [place]: [place] ([place]) Interventions     Readmission Risk Interventions Readmission Risk Prevention Plan 10/21/2020 09/27/2020 06/17/2020  Transportation Screening Complete Complete Complete  PCP or Specialist Appt within 3-5 Days Complete Complete Complete  HRI or Home Care Consult Complete Complete Complete  Social Work Consult for [place] Planning/Counseling Complete Not Complete Complete  SW consult not completed comments - RNCM assigned to patient -  Palliative Care Screening Not Applicable Not Applicable Not Applicable  Medication Review Press photographer) Complete Complete Complete  Some recent data might be hidden

## 2021-06-24 NOTE — Progress Notes (Signed)
[facility]  ROUNDING NOTE   Subjective:   Patient sitting up in bed Breakfast tray at bedside Alert and oriented Tolerating meals without nausea and vomiting Denies shortness of breath and muscle pain  Sodium 131 UOP 2.4L in 24 hours  Objective:  Vital signs in last 24 hours:  Temp:  [97.8 F (36.6 C)-98.7 F (37.1 C)] 97.8 F (36.6 C) (02/17 0741) Pulse Rate:  [64-76] 76 (02/17 0741) Resp:  [16-17] 16 (02/17 0741) BP: (107-119)/(49-59) 118/59 (02/17 0741) SpO2:  [94 %-99 %] 94 % (02/17 0741) Weight:  [74.3 kg] 74.3 kg (02/17 0427)  Weight change:  Filed Weights   06/17/21 0409 06/22/21 0500 06/24/21 0427  Weight: 75.1 kg 75.3 kg 74.3 kg    Intake/Output: I/O last 3 completed shifts: In: 1779 [P.O.:1316] Out: 3400 [Urine:3400]   Intake/Output this shift:  Total I/O In: 120 [P.O.:120] Out: -   Physical Exam: General: NAD, resting in bed  Head: Normocephalic, atraumatic. Moist oral mucosal membranes, No thrush  Eyes: Anicteric  Lungs:  Clear to auscultation, normal effort  Heart: Regular rate and rhythm  Abdomen:  Soft, nontender  Extremities: 1+ peripheral edema.  Neurologic: Nonfocal, moving all four extremities  Skin: No lesions        Basic Metabolic Panel: Recent Labs  Lab 06/18/21 0507 06/18/21 1049 06/20/21 0429 06/20/21 0810 06/20/21 1914 06/21/21 0814 06/23/21 0610 06/24/21 0543  NA 120*   < > 129* 126* 127* 128* 128* 131*  K 3.8  --  4.0  --   --  4.2 4.4 4.5  CL 90*  --  94*  --   --  90* 90* 91*  CO2 27  --  27  --   --  32 33* 36*  GLUCOSE 109*  --  123*  --   --  74 165* 99  BUN 19  --  20  --   --  14 19 26*  CREATININE <0.30*  --  0.30*  --   --  <0.30* 0.47* 0.39*  CALCIUM 7.6*  --  7.7*  --   --  7.9* 8.2* 8.6*  PHOS  --   --   --   --   --   --  2.8  --    < > = values in this interval not displayed.     Liver Function Tests: Recent Labs  Lab 06/23/21 0610  ALBUMIN 2.3*    No results for input(s):  LIPASE, AMYLASE in the last 168 hours. No results for input(s): AMMONIA in the last 168 hours.  CBC: Recent Labs  Lab 06/18/21 0507  WBC 15.4*  HGB 9.8*  HCT 28.6*  MCV 82.2  PLT 305     Cardiac Enzymes: No results for input(s): CKTOTAL, CKMB, CKMBINDEX, TROPONINI in the last 168 hours.  BNP: Invalid input(s): POCBNP  CBG: Recent Labs  Lab 06/21/21 0440 06/22/21 0525 06/22/21 0527 06/23/21 0446 06/24/21 0357  GLUCAP 113* 99 95 195* 110*     Microbiology: Results for orders placed or performed during the hospital encounter of 06/15/21  Resp Panel by RT-PCR (Flu A&B, Covid) Nasopharyngeal Swab     Status: None   Collection Time: 06/15/21  7:16 PM   Specimen: Nasopharyngeal Swab; Nasopharyngeal(NP) swabs in vial transport medium  Result Value Ref Range Status   SARS Coronavirus 2 by RT PCR NEGATIVE NEGATIVE Final    Comment: (NOTE) SARS-CoV-2 target nucleic acids are NOT DETECTED.  The SARS-CoV-2 RNA is generally detectable  in upper respiratory specimens during the acute phase of infection. The lowest concentration of SARS-CoV-2 viral copies this assay can detect is 138 copies/mL. A negative result does not preclude SARS-Cov-2 infection and should not be used as the sole basis for treatment or other patient management decisions. A negative result may occur with  improper specimen collection/handling, submission of specimen other than nasopharyngeal swab, presence of viral mutation(s) within the areas targeted by this assay, and inadequate number of viral copies(<138 copies/mL). A negative result must be combined with clinical observations, patient history, and epidemiological information. The expected result is Negative.  Fact Sheet for Patients:  EntrepreneurPulse.com.au  Fact Sheet for Healthcare Providers:  IncredibleEmployment.be  This test is no t yet approved or cleared by the [place] FDA and  has been  authorized for detection and/or diagnosis of SARS-CoV-2 by FDA under an Emergency Use Authorization (EUA). This EUA will remain  in effect (meaning this test can be used) for the duration of the COVID-19 declaration under Section 564(b)(1) of the Act, 21 U.S.C.section 360bbb-3(b)(1), unless the authorization is terminated  or revoked sooner.       Influenza A by PCR NEGATIVE NEGATIVE Final   Influenza B by PCR NEGATIVE NEGATIVE Final    Comment: (NOTE) The Xpert Xpress SARS-CoV-2/FLU/RSV plus assay is intended as an aid in the diagnosis of influenza from Nasopharyngeal swab specimens and should not be used as a sole basis for treatment. Nasal washings and aspirates are unacceptable for Xpert Xpress SARS-CoV-2/FLU/RSV testing.  Fact Sheet for Patients: EntrepreneurPulse.com.au  Fact Sheet for Healthcare Providers: IncredibleEmployment.be  This test is not yet approved or cleared by the [place] FDA and has been authorized for detection and/or diagnosis of SARS-CoV-2 by FDA under an Emergency Use Authorization (EUA). This EUA will remain in effect (meaning this test can be used) for the duration of the COVID-19 declaration under Section 564(b)(1) of the Act, 21 U.S.C. section 360bbb-3(b)(1), unless the authorization is terminated or revoked.  Performed at [facility], [address] [place], [place] [address]   MRSA Next Gen by PCR, Nasal     Status: Abnormal   Collection Time: 06/16/21  6:05 PM   Specimen: Nasal Mucosa; Nasal Swab  Result Value Ref Range Status   MRSA by PCR Next Gen DETECTED (A) NOT DETECTED Final    Comment: RESULT CALLED TO, READ BACK BY AND VERIFIED WITH: MEGAN MCFURR AT 1920 ON 06/16/21 BY SS (NOTE) The GeneXpert MRSA Assay (FDA approved for NASAL specimens only), is one component of a comprehensive MRSA colonization surveillance program. It is not intended to diagnose MRSA infection nor to guide or  monitor treatment for MRSA infections. Test performance is not FDA approved in patients less than 6 years old. Performed at [facility], [address] [place], [place] [address]     Coagulation Studies: No results for input(s): LABPROT, INR in the last 72 hours.  Urinalysis: No results for input(s): COLORURINE, LABSPEC, PHURINE, GLUCOSEU, HGBUR, BILIRUBINUR, KETONESUR, PROTEINUR, UROBILINOGEN, NITRITE, LEUKOCYTESUR in the last 72 hours.  Invalid input(s): APPERANCEUR     Imaging: No results found.   Medications:      (feeding supplement) PROSource Plus  30 mL Oral TID BM   atorvastatin  20 mg Oral QHS   diltiazem  120 mg Oral BID   DULoxetine  30 mg Oral Daily   enoxaparin (LOVENOX) injection  40 mg Subcutaneous Q24H   feeding supplement  1 Container Oral TID BM  ipratropium-albuterol  3 mL Nebulization TID   mometasone-formoterol  2 puff Inhalation BID   montelukast  10 mg Oral QHS   multivitamin with minerals  1 tablet Oral Daily   mupirocin ointment  1 application Nasal BID   nicotine  21 mg Transdermal Daily   nystatin  5 mL Mouth/Throat QID   pantoprazole  20 mg Oral Daily   predniSONE  40 mg Oral QAC breakfast   sodium chloride flush  3 mL Intravenous Q12H   sodium chloride  2 g Oral BID WC   acetaminophen **OR** acetaminophen, albuterol, hydrALAZINE, ipratropium-albuterol, nicotine polacrilex, traMADol  Assessment/ Plan:  [name] is a 71 y.o.  male [name] is a 71 y.o. white male with COPD with ongoing tobacco use, GERD, hypertension, , who was admitted to [facility] on 06/15/2021 for  Syncope [R55] Hyponatremia [E87.1]    Hyponatremia: hypoosmolar with hypovolemia. 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  - Sodium 131 -Recommend continuing salt tabs with fluid restriction 1245mL day -Received one-time dose of furosemide 20 mg IV  yesterday.  Adequate urine output recorded.  We will recommend  furosemide 10 mg oral as needed at discharge to manage edema.    Anemia: consistent with iron deficiency.    Lab Results  Component Value Date   HGB 9.8 (L) 06/18/2021   Will continue to monitor   Hypertension: Holding home lisinopril and diltiazem. BP stable for this patient  Avoid Hypotension   LOS: 8   2/17/20231:46 PM

## 2021-06-24 NOTE — Discharge Summary (Signed)
Physician Discharge Summary  Jeremiah [name]:[id number] DOB: 1950/09/29 DOA: 06/15/2021  PCP: [place] [place] [facility]  Admit date: 06/15/2021 Discharge date: 06/24/2021  Time spent: 60 minutes  Recommendations for Outpatient Follow-up:  Follow-up with [facility] in 2 weeks.  On follow-up patient will need a basic metabolic profile done to follow-up on electrolytes and renal function.  Patient's blood pressure also need to be reassessed.  Patient's COPD will also need to be followed up upon. Patient was discharged with home health therapies.   Discharge Diagnoses:  Principal Problem:   Syncope Active Problems:   Tobacco dependency   COPD, moderate (HCC)   Nicotine dependence   Hyponatremia   HTN (hypertension)   GERD (gastroesophageal reflux disease)   Thrush, oral   Unintentional weight loss   Pressure injury of skin   Dehydration   Protein-calorie malnutrition, severe   Discharge Condition: Stable and improved  Diet recommendation: Dysphagia 3 diet with thin liquids and 1200 cc/day fluid restriction  Filed Weights   06/17/21 0409 06/22/21 0500 06/24/21 0427  Weight: 75.1 kg 75.3 kg 74.3 kg    History of present illness:  HPI per Dr. Tobie Poet 71 year old male with history of GERD, depression, anxiety, hyperlipidemia, tobacco dependence, who presents to the emergency department for chief concerns of confusion/Loss of consciousness.   Initial vitals in the emergency department show temperature of 98, respiration rate of 18, heart rate of 67, blood pressure 108/65, SPO2 of 97% on 4 L baseline nasal cannula.  Labs in the emergency department showed serum sodium 117, potassium 4.4, chloride 83, bicarb 27, BUN of 16, serum creatinine of 0.45, nonfasting blood glucose 101.   BNP was 56.  Troponin was 6.   Patient had leukocytosis of 13.9, hemoglobin 9.7, platelets of 351.   TSH was 1.936.   UA was negative for leukocytes and nitrates.   In the  emergency department patient was given sodium chloride 100 mL/h, for approximately 5 hours.  At bedside he is able to tell me his name, age, current location of hospital, current year is 2023.    He endorses baseline shortness of breath and headache, as he has history of cluster migraines. He states both these symptoms are not new.    He does not know if he passed out. He feels weak. He states that for the last month, his mouth has been feeling raw and it hurts him to swallow. He states this has caused a fear of eating and endorses poor po intake. He endorses nausea in the AM.   He denies vomiting, chest pain, shortness of breath, dysuria, diarrhea, swelling in his legs. He endorses unintentional weight loss of about 27 pounds in 1 month.   Social history: From [employer]. He is tobacco user, had not smoked in 40 days. At his peak, he smoked nearly 1 ppd. He started smoking at age 34. He denies etoh and recreational drug use. Formerly, he worked for [employer] in [place].    Vaccination history: He is vaccinated for covid, 2 doses of J&J.   ROS: Constitutional: no weight change, no fever ENT/Mouth: no sore throat, no rhinorrhea Eyes: no eye pain, no vision changes Cardiovascular: no chest pain, no dyspnea,  no edema, no palpitations Respiratory: no cough, no sputum, no wheezing Gastrointestinal: no nausea, no vomiting, no diarrhea, no constipation Genitourinary: no urinary incontinence, no dysuria, no hematuria Musculoskeletal: no arthralgias, no myalgias Skin: no skin lesions, no pruritus, Neuro: + weakness, no loss of consciousness,  no syncope Psych: no anxiety, no depression, + decrease appetite Heme/Lymph: no bruising, no bleeding   ED Course: Patient requiring hospitalization for chief concerns of hyponatremia.   Hospital Course:  Syncope- (present on admission) Most likely multifactorial with poor p.o. intake, taking several antihypertensives and hyponatremia.  Repeat  echocardiogram with normal EF and grade 1 diastolic dysfunction.  No regional wall motion abnormalities. -Encouraged p.o. hydration -Blood pressure improved.   -Antihypertensive medications were initially held and Cardizem resumed. -Lisinopril discontinued on discharge. -Outpatient follow-up with PCP.   Hypovolemic hyponatremia- (present on admission) 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  Current labs consistent with hyposmolar, hypovolemic hyponatremia Nephrology was consulted- Patient was transferred to ICU for hypertonic saline and received for 2 days. Sodium improved and seems to be stabilizing at 128 -131.   -Patient maintained on salt tablets as well as 1200 cc/day fluid restriction.  -Patient with some bilateral upper extremity swelling and received Lasix 20 mg IV x2 doses as well as a dose of IV albumin with good urine output.   -Hyponatremia stabilized and was at 131 by day of discharge.   -Patient was seen by nephrology during the hospitalization will follow the patient.  -Outpatient follow-up with PCP.       Thrush, oral- (present on admission) -Patient placed on nystatin swish and swallow during this hospitalization with clinical improvement and no further difficulty with swallowing.   -Patient maintained on a dysphagia 3 diet.   -Patient was discharged home on 4-5 more days of nystatin swish and swallow.  -Outpatient follow-up with PCP.    HTN (hypertension)- (present on admission) Blood pressure within goal.  Home antihypertensives which include diltiazem 120 mg twice daily and lisinopril 10 mg was held initially during the hospitalization. -Blood pressure improved and diltiazem resumed. -Lisinopril discontinued on discharge. -Outpatient follow-up with PCP.Marland Kitchen   COPD, moderate ([place])- (present on admission)/shortness of breath -Patient on baseline O2 requirements of 4 L.   -Patient with some complaints of worsening shortness of breath from baseline on  06/22/2021.  -Some improvement with shortness of breath after diuretics and steroids and scheduled nebulizer treatments.  -Transitioned from IV Solu-Medrol to oral prednisone 40 mg daily x3 -4days which patient will be discharged home on.. -Patient was maintained on Dulera, scheduled DuoNebs, supplemental oxygen. -Patient improved clinically, received a dose of IV albumin followed by Lasix 20 mg IV x1 due to edematous extremities with improvement.   -Patient be discharged home on 3-4 more days of oral prednisone, resumption of home regimen of bronchodilators, DuoNebs as needed.   -Outpatient follow-up with PCP.    Tobacco dependency- (present on admission) -Patient maintained on nicotine patch.     Unintentional weight loss- (present on admission) Patient with poor p.o. intake with oral thrush. -Patient maintained on nystatin swish and swallow with clinical improvement and improvement with oral intake.   -Outpatient follow-up with PCP.     Protein-calorie malnutrition, severe Estimated body mass index is 22.45 kg/m as calculated from the following:   Height as of this encounter: 6' (1.829 m).   Weight as of this encounter: 75.1 kg.  -IV albumin x1 given. -Dietitian consulted -Patient placed on nutritional supplementation. -Outpatient follow-up with PCP.   Pressure injury of skin- (present on admission) Pressure Injury 06/15/21 Coccyx Medial Stage 2 -  Partial thickness loss of dermis presenting as a shallow open injury with a red, pink wound bed without slough. pink, first layer gone (Active)  06/15/21 0800  Location: Coccyx  Location Orientation: Medial  Staging: Stage 2 -  Partial thickness loss of dermis presenting as a shallow open injury with a red, pink wound bed without slough.  Wound Description (Comments): pink, first layer gone  Present on Admission: Yes     Pressure Injury 06/16/21 Buttocks Right Stage 2 -  Partial thickness loss of dermis presenting as a shallow open  injury with a red, pink wound bed without slough. (Active)  06/16/21 2322  Location: Buttocks  Location Orientation: Right  Staging: Stage 2 -  Partial thickness loss of dermis presenting as a shallow open injury with a red, pink wound bed without slough.  Wound Description (Comments):   Present on Admission: Yes      Procedures: CT head 06/15/2021 Chest x-ray 06/15/2021 2D echo 06/15/2021  Consultations: Nephrology: Dr.Kolluru 06/16/2021  Discharge Exam: Vitals:   06/24/21 1346 06/24/21 1535  BP:  (!) 120/58  Pulse:  71  Resp:  15  Temp:  98.5 F (36.9 C)  SpO2: 94% 95%    General: NAD Cardiovascular: RRR no murmurs rubs or gallops.  No JVD.  No lower extremity edema. Respiratory: Lungs clear to auscultation bilaterally.  Fair air movement.  No wheezing.  No crackles.  Speaking in full sentences.  No use of accessory muscles of respiration.  Discharge Instructions   Discharge Instructions     Diet - low sodium heart healthy   Complete by: As directed    Dysphagia 3 diet with thin liquids.  1200 cc/day fluid restriction.   Discharge wound care:   Complete by: As directed    As above.   Increase activity slowly   Complete by: As directed       Allergies as of 06/24/2021       Reactions   Penicillins Anaphylaxis, Swelling, Other (See Comments)   Tolerated Ceftriaxone 06/2020 05/2020 childhood reaction and his mother told him that he "swelled up." Had a PCN reaction causing immediate rash, facial/tongue/throat swelling, SOB or lightheadedness with hypotension: Yes Had a PCN reaction causing severe rash involving mucus membranes or skin necrosis: No Had a PCN reaction that required hospitalization No Had a PCN reaction occurring within the last 10 years: No If all the above answers are "NO", may proceed with Cephalosporin use.        Medication List     STOP taking these medications    dexamethasone 6 MG tablet Commonly known as: DECADRON   lisinopril 10 MG  tablet Commonly known as: ZESTRIL       TAKE these medications    acetaminophen 325 MG tablet Commonly known as: TYLENOL Take 2 tablets (650 mg total) by mouth every 6 (six) hours as needed for mild pain (or Fever >/= 101).   albuterol 108 (90 Base) MCG/ACT inhaler Commonly known as: VENTOLIN HFA Inhale into the lungs every 6 (six) hours as needed for wheezing or shortness of breath.   atorvastatin 20 MG tablet Commonly known as: LIPITOR Take 20 mg by mouth daily. What changed: Another medication with the same name was removed. Continue taking this medication, and follow the directions you see here.   Breo Ellipta 100-25 MCG/ACT Aepb Generic drug: fluticasone furoate-vilanterol Inhale 1 puff into the lungs daily.   diltiazem 120 MG tablet Commonly known as: Cardizem Take 1 tablet (120 mg total) by mouth 2 (two) times daily.   DULoxetine 30 MG capsule Commonly known as: CYMBALTA Take 30 mg by mouth daily.   furosemide 20 MG tablet Commonly known as:  Lasix Take 0.5 tablets (10 mg total) by mouth daily as needed for edema.   guaiFENesin-dextromethorphan 100-10 MG/5ML syrup Commonly known as: ROBITUSSIN DM Take 5 mLs by mouth every 6 (six) hours as needed for cough.   ipratropium-albuterol 0.5-2.5 (3) MG/3ML Soln Commonly known as: DUONEB Take 3 mLs by nebulization every 6 (six) hours as needed.   montelukast 10 MG tablet Commonly known as: SINGULAIR Take 1 tablet (10 mg total) by mouth at bedtime.   mupirocin ointment 2 % Commonly known as: BACTROBAN Apply 1 application topically 2 (two) times daily.   nicotine 21 mg/24hr patch Commonly known as: NICODERM CQ - dosed in mg/24 hours Place 1 patch (21 mg total) onto the skin daily.   nicotine polacrilex 2 MG gum Commonly known as: NICORETTE Take 1 each (2 mg total) by mouth as needed for smoking cessation.   nystatin 100000 UNIT/ML suspension Commonly known as: MYCOSTATIN Use as directed 5 mLs (500,000 Units  total) in the mouth or throat 4 (four) times daily for 5 days. What changed: how to take this   pantoprazole 20 MG tablet Commonly known as: PROTONIX Take 1 tablet (20 mg total) by mouth daily.   predniSONE 20 MG tablet Commonly known as: DELTASONE Take 2 tablets (40 mg total) by mouth daily before breakfast for 3 days. Start taking on: June 25, 2021   sodium chloride 1 g tablet Take 2 tablets (2 g total) by mouth 2 (two) times daily with a meal. What changed:  how much to take when to take this   tiotropium 18 MCG inhalation capsule Commonly known as: SPIRIVA Place 18 mcg into inhaler and inhale daily.               Discharge Care Instructions  (From admission, onward)           Start     Ordered   06/24/21 0000  Discharge wound care:       Comments: As above.   06/24/21 1526           Allergies  Allergen Reactions   Penicillins Anaphylaxis, Swelling and Other (See Comments)    Tolerated Ceftriaxone 06/2020 05/2020 childhood reaction and his mother told him that he "swelled up."  Had a PCN reaction causing immediate rash, facial/tongue/throat swelling, SOB or lightheadedness with hypotension: Yes Had a PCN reaction causing severe rash involving mucus membranes or skin necrosis: No Had a PCN reaction that required hospitalization No Had a PCN reaction occurring within the last 10 years: No If all the above answers are "NO", may proceed with Cephalosporin use.    Follow-up [place] Schedule an appointment as soon as possible for a visit in 2 week(s).   Contact information: [place] [address] [phone]                  The results of significant diagnostics from this hospitalization (including imaging, microbiology, ancillary and laboratory) are listed below for reference.    Significant Diagnostic Studies: DG Chest 2 View  Result Date: 06/15/2021 CLINICAL DATA:  Shortness of  breath EXAM: CHEST - 2 VIEW COMPARISON:  06/04/2021, CT chest 05/28/2021, chest x-ray 05/14/2020 FINDINGS: Advanced emphysema and diffuse bronchitic changes. No focal opacity, pleural effusion, or pneumothorax. Stable cardiomediastinal silhouette. Aortic atherosclerosis. IMPRESSION: Emphysema and chronic bronchitic changes without acute airspace disease Electronically Signed   By: Donavan Foil M.D.   On: 06/15/2021 17:06   CT  HEAD WO CONTRAST (5MM)  Result Date: 06/15/2021 CLINICAL DATA:  Mental status change, unknown cause. Three episodes of loss of consciousness today. EXAM: CT HEAD WITHOUT CONTRAST TECHNIQUE: Contiguous axial images were obtained from the base of the skull through the vertex without intravenous contrast. RADIATION DOSE REDUCTION: This exam was performed according to the departmental dose-optimization program which includes automated exposure control, adjustment of the mA and/or kV according to patient size and/or use of iterative reconstruction technique. COMPARISON:  None. FINDINGS: Brain: There is no evidence of an acute infarct, intracranial hemorrhage, mass, midline shift, or extra-axial fluid collection. The ventricles and sulci are within normal limits for age. Patchy and confluent hypodensities in the cerebral white matter bilaterally are nonspecific but compatible with extensive chronic small vessel ischemic disease. Vascular: Calcified atherosclerosis at the skull base. No hyperdense vessel. Skull: No acute fracture or suspicious osseous lesion. Sinuses/Orbits: Mild right ethmoid air cell mucosal thickening. Trace bilateral mastoid fluid. Bilateral cataract extraction. Other: None. IMPRESSION: 1. No evidence of acute intracranial abnormality. 2. Extensive chronic small vessel ischemic disease. Electronically Signed   By: Logan Bores M.D.   On: 06/15/2021 16:53   CT Angio Chest Pulmonary Embolism (PE) W or WO Contrast  Result Date: 05/28/2021 CLINICAL DATA:  Dyspnea, cyanosis,  altered mental status EXAM: CT ANGIOGRAPHY CHEST WITH CONTRAST TECHNIQUE: Multidetector CT imaging of the chest was performed using the standard protocol during bolus administration of intravenous contrast. Multiplanar CT image reconstructions and MIPs were obtained to evaluate the vascular anatomy. RADIATION DOSE REDUCTION: This exam was performed according to the departmental dose-optimization program which includes automated exposure control, adjustment of the mA and/or kV according to patient size and/or use of iterative reconstruction technique. CONTRAST:  89mL OMNIPAQUE IOHEXOL 350 MG/ML SOLN COMPARISON:  10/18/2020 FINDINGS: Cardiovascular: There is adequate opacification of the pulmonary arterial tree. No intraluminal filling defect identified to suggest acute pulmonary embolism. The central pulmonary arteries are enlarged in keeping with changes of pulmonary arterial hypertension. Extensive multi-vessel coronary artery calcification. Global cardiac size within normal limits. Degenerative calcification of the aortic valve leaflets noted. Trace pericardial effusion. Extensive atherosclerotic calcification of the thoracic aorta with particularly prominent atherosclerotic calcification of the origin of the left common carotid, left vertebral, and left subclavian arteries at their origin from the aortic arch. This almost certainly results in hemodynamically significant stenosis of the left vertebral artery origin. Mediastinum/Nodes: There is progressive pathologic right hilar adenopathy with multiple lymph nodes now measuring 16-17 mm in short axis diameter. Shotty subcarinal and left hilar adenopathy persists unchanged. Visualized thyroid is unremarkable. Esophagus is unremarkable. Lungs/Pleura: Severe centrilobular emphysema again noted since the prior examination there have developed scattered asymmetric mid and lower lung zone peripheral pulmonary infiltrates and areas of focal consolidation which may  reflect changes of atypical infection, including viral pneumonia, in the acute setting. Stable mean 12 mm nodule within the subpleural right lower lobe, axial image # 88/6. No pneumothorax or pleural effusion. Central airways are widely patent. There are scattered areas of peripheral airway impaction noted, however, best appreciated within the lower lobes bilaterally. Upper Abdomen: No acute abnormality. Musculoskeletal: Osseous structures are age-appropriate. No acute bone abnormality. No lytic or blastic bone lesion. Review of the MIP images confirms the above findings. IMPRESSION: No pulmonary embolism. Extensive coronary artery calcification. Degenerative calcification of the aortic valve leaflets. Echocardiography may be helpful to assess the degree of valvular dysfunction. Morphologic changes in keeping with pulmonary arterial hypertension. Severe emphysema. Interval development of multifocal asymmetric peripheral  pulmonary infiltrates, most in keeping with atypical infection, including viral infection, or inflammation in the acute setting. Stable 12 mm right lower lobe pulmonary nodule, indeterminate. Repeat CT imaging in note 12-18 months would be helpful to assure continued stability. This recommendation follows the consensus statement: Guidelines for Management of Incidental Pulmonary Nodules Detected on CT Images: From the Fleischner Society 2017; Radiology 2017; 284:228-243. Progressive, pathologic right hilar adenopathy. This may be reactive in nature given the acute inflammatory findings, however, follow-up evaluation once the patient's acute issues have resolved may be helpful to document resolution. Peripheral vascular disease. If there is clinical evidence of vertebrobasilar insufficiency, CT arteriography may be helpful for further evaluation. Aortic Atherosclerosis (ICD10-I70.0) and Emphysema (ICD10-J43.9). Electronically Signed   By: Fidela Salisbury M.D.   On: 05/28/2021 22:03   DG Chest  Portable 1 View  Result Date: 06/04/2021 CLINICAL DATA:  Shortness of breath. Wheezing. EXAM: PORTABLE CHEST 1 VIEW COMPARISON:  05/28/2021 and 10/18/2020 FINDINGS: Heart size remains at the upper limits of normal. Aortic atherosclerotic calcification noted. Pulmonary hyperinflation and chronic prominence of interstitial lung markings shows no significant change, consistent with COPD. No evidence of acute or superimpose infiltrate or pleural effusion. IMPRESSION: COPD. No acute findings. Electronically Signed   By: Marlaine Hind M.D.   On: 06/04/2021 14:29   DG Chest Portable 1 View  Result Date: 05/28/2021 CLINICAL DATA:  Shortness of breath EXAM: PORTABLE CHEST 1 VIEW COMPARISON:  05/20/2020, CT 10/18/2020, chest x-ray 05/14/2020 FINDINGS: Emphysema and diffuse bronchitic changes. Overall diffuse increased interstitial opacity compared to previous exam suggesting acute superimposed interstitial edema or inflammatory process. No confluent airspace disease, pleural effusion or pneumothorax. Stable cardiomediastinal silhouette with aortic atherosclerosis. IMPRESSION: Emphysema and bronchitic changes. Overall diffusely increased interstitial opacity is suspicious for acute superimposed interstitial edema or inflammatory process. No confluent airspace disease is seen. Electronically Signed   By: Donavan Foil M.D.   On: 05/28/2021 17:11   ECHOCARDIOGRAM COMPLETE  Result Date: 06/16/2021    ECHOCARDIOGRAM REPORT   Patient Name:   CARRINGTON OLAZABAL [facility] Date of Exam: 06/15/2021 Medical Rec #:  [id number]           Height:       72.0 in Accession #:    [id number]          Weight:       155.6 lb Date of Birth:  05-29-1950           BSA:          1.915 m Patient Age:    40 years            BP:           135/70 mmHg Patient Gender: M                   HR:           64 bpm. Exam Location:  [facility] Procedure: 2D Echo, Cardiac Doppler and Color Doppler Indications:     R55 Syncope  History:         Patient has prior history of  Echocardiogram examinations, most                  recent 06/11/2020. COPD; Risk Factors:Hypertension and Current                  Smoker. Asthma.  Sonographer:     Cresenciano Lick RDCS Referring Phys:  [id number] AMY N [facility] Diagnosing Phys: Ida Rogue MD IMPRESSIONS  1. Left ventricular ejection fraction, by estimation, is 55 to 60%. The left ventricle has normal function. The left ventricle has no regional wall motion abnormalities. Left ventricular diastolic parameters are consistent with Grade I diastolic dysfunction (impaired relaxation).  2. Right ventricular systolic function is normal. The right ventricular size is normal.  3. The mitral valve is normal in structure. No evidence of mitral valve regurgitation. No evidence of mitral stenosis.  4. The aortic valve is normal in structure. Aortic valve regurgitation is not visualized. No aortic stenosis is present.  5. The inferior vena cava is normal in size with greater than 50% respiratory variability, suggesting right atrial pressure of 3 mmHg. FINDINGS  Left Ventricle: Left ventricular ejection fraction, by estimation, is 55 to 60%. The left ventricle has normal function. The left ventricle has no regional wall motion abnormalities. The left ventricular internal cavity size was normal in size. There is  no left ventricular hypertrophy. Left ventricular diastolic parameters are consistent with Grade I diastolic dysfunction (impaired relaxation). Right Ventricle: The right ventricular size is normal. No increase in right ventricular wall thickness. Right ventricular systolic function is normal. Left Atrium: Left atrial size was normal in size. Right Atrium: Right atrial size was normal in size. Pericardium: There is no evidence of pericardial effusion. Mitral Valve: The mitral valve is normal in structure. Mild mitral annular calcification. No evidence of mitral valve regurgitation. No evidence of mitral valve stenosis. Tricuspid Valve: The tricuspid  valve is normal in structure. Tricuspid valve regurgitation is not demonstrated. No evidence of tricuspid stenosis. Aortic Valve: The aortic valve is normal in structure. Aortic valve regurgitation is not visualized. No aortic stenosis is present. Aortic valve mean gradient measures 4.0 mmHg. Aortic valve peak gradient measures 10.0 mmHg. Aortic valve area, by VTI measures 2.36 cm. Pulmonic Valve: The pulmonic valve was normal in structure. Pulmonic valve regurgitation is not visualized. No evidence of pulmonic stenosis. Aorta: The aortic root is normal in size and structure. Venous: The inferior vena cava is normal in size with greater than 50% respiratory variability, suggesting right atrial pressure of 3 mmHg. IAS/Shunts: No atrial level shunt detected by color flow Doppler.  LEFT VENTRICLE PLAX 2D LVIDd:         5.70 cm   Diastology LVIDs:         3.21 cm   LV e' medial:    10.00 cm/s LV PW:         0.92 cm   LV E/e' medial:  10.1 LV IVS:        0.72 cm   LV e' lateral:   9.14 cm/s LVOT diam:     2.00 cm   LV E/e' lateral: 11.1 LV SV:         67 LV SV Index:   35 LVOT Area:     3.14 cm  RIGHT VENTRICLE RV Basal diam:  4.74 cm RV S prime:     20.90 cm/s TAPSE (M-mode): 2.6 cm LEFT ATRIUM             Index        RIGHT ATRIUM           Index LA diam:        4.80 cm 2.51 cm/m   RA Area:     21.40 cm LA Vol (A2C):   61.1 ml 31.90 ml/m  RA Volume:   70.80 ml  36.97 ml/m LA Vol (A4C):   61.2 ml 31.95 ml/m LA Biplane  Vol: 63.1 ml 32.95 ml/m  AORTIC VALVE AV Area (Vmax):    1.99 cm AV Area (Vmean):   2.25 cm AV Area (VTI):     2.36 cm AV Vmax:           158.00 cm/s AV Vmean:          95.600 cm/s AV VTI:            0.282 m AV Peak Grad:      10.0 mmHg AV Mean Grad:      4.0 mmHg LVOT Vmax:         99.90 cm/s LVOT Vmean:        68.450 cm/s LVOT VTI:          0.212 m LVOT/AV VTI ratio: 0.75  AORTA Ao Root diam: 4.00 cm MITRAL VALVE MV Area (PHT): 2.95 cm     SHUNTS MV Decel Time: 257 msec     Systemic VTI:   0.21 m MV E velocity: 101.00 cm/s  Systemic Diam: 2.00 cm MV A velocity: 135.00 cm/s MV E/A ratio:  0.75 Ida Rogue MD Electronically signed by Ida Rogue MD Signature Date/Time: 06/16/2021/7:57:55 AM    Final     Microbiology: Recent Results (from the past 240 hour(s))  Resp Panel by RT-PCR (Flu A&B, Covid) Nasopharyngeal Swab     Status: None   Collection Time: 06/15/21  7:16 PM   Specimen: Nasopharyngeal Swab; Nasopharyngeal(NP) swabs in vial transport medium  Result Value Ref Range Status   SARS Coronavirus 2 by RT PCR NEGATIVE NEGATIVE Final    Comment: (NOTE) SARS-CoV-2 target nucleic acids are NOT DETECTED.  The SARS-CoV-2 RNA is generally detectable in upper respiratory specimens during the acute phase of infection. The lowest concentration of SARS-CoV-2 viral copies this assay can detect is 138 copies/mL. A negative result does not preclude SARS-Cov-2 infection and should not be used as the sole basis for treatment or other patient management decisions. A negative result may occur with  improper specimen collection/handling, submission of specimen other than nasopharyngeal swab, presence of viral mutation(s) within the areas targeted by this assay, and inadequate number of viral copies(<138 copies/mL). A negative result must be combined with clinical observations, patient history, and epidemiological information. The expected result is Negative.  Fact Sheet for Patients:  EntrepreneurPulse.com.au  Fact Sheet for Healthcare Providers:  IncredibleEmployment.be  This test is no t yet approved or cleared by the [place] FDA and  has been authorized for detection and/or diagnosis of SARS-CoV-2 by FDA under an Emergency Use Authorization (EUA). This EUA will remain  in effect (meaning this test can be used) for the duration of the COVID-19 declaration under Section 564(b)(1) of the Act, 21 U.S.C.section 360bbb-3(b)(1), unless the  authorization is terminated  or revoked sooner.       Influenza A by PCR NEGATIVE NEGATIVE Final   Influenza B by PCR NEGATIVE NEGATIVE Final    Comment: (NOTE) The Xpert Xpress SARS-CoV-2/FLU/RSV plus assay is intended as an aid in the diagnosis of influenza from Nasopharyngeal swab specimens and should not be used as a sole basis for treatment. Nasal washings and aspirates are unacceptable for Xpert Xpress SARS-CoV-2/FLU/RSV testing.  Fact Sheet for Patients: EntrepreneurPulse.com.au  Fact Sheet for Healthcare Providers: IncredibleEmployment.be  This test is not yet approved or cleared by the [place] FDA and has been authorized for detection and/or diagnosis of SARS-CoV-2 by FDA under an Emergency Use Authorization (EUA). This EUA will remain in effect (meaning this  test can be used) for the duration of the COVID-19 declaration under Section 564(b)(1) of the Act, 21 U.S.C. section 360bbb-3(b)(1), unless the authorization is terminated or revoked.  Performed at [facility], [address] [place], [place] [address]   MRSA Next Gen by PCR, Nasal     Status: Abnormal   Collection Time: 06/16/21  6:05 PM   Specimen: Nasal Mucosa; Nasal Swab  Result Value Ref Range Status   MRSA by PCR Next Gen DETECTED (A) NOT DETECTED Final    Comment: RESULT CALLED TO, READ BACK BY AND VERIFIED WITH: MEGAN MCFURR AT 1920 ON 06/16/21 BY SS (NOTE) The GeneXpert MRSA Assay (FDA approved for NASAL specimens only), is one component of a comprehensive MRSA colonization surveillance program. It is not intended to diagnose MRSA infection nor to guide or monitor treatment for MRSA infections. Test performance is not FDA approved in patients less than 78 years old. Performed at [facility], [address] [place], [place] [address]      Labs: Basic Metabolic Panel: Recent Labs  Lab 06/18/21 0507 06/18/21 1049 06/20/21 0429  06/20/21 0810 06/20/21 1914 06/21/21 0814 06/23/21 0610 06/24/21 0543  NA 120*   < > 129* 126* 127* 128* 128* 131*  K 3.8  --  4.0  --   --  4.2 4.4 4.5  CL 90*  --  94*  --   --  90* 90* 91*  CO2 27  --  27  --   --  32 33* 36*  GLUCOSE 109*  --  123*  --   --  74 165* 99  BUN 19  --  20  --   --  14 19 26*  CREATININE <0.30*  --  0.30*  --   --  <0.30* 0.47* 0.39*  CALCIUM 7.6*  --  7.7*  --   --  7.9* 8.2* 8.6*  PHOS  --   --   --   --   --   --  2.8  --    < > = values in this interval not displayed.   Liver Function Tests: Recent Labs  Lab 06/23/21 0610  ALBUMIN 2.3*   No results for input(s): LIPASE, AMYLASE in the last 168 hours. No results for input(s): AMMONIA in the last 168 hours. CBC: Recent Labs  Lab 06/18/21 0507  WBC 15.4*  HGB 9.8*  HCT 28.6*  MCV 82.2  PLT 305   Cardiac Enzymes: No results for input(s): CKTOTAL, CKMB, CKMBINDEX, TROPONINI in the last 168 hours. BNP: BNP (last 3 results) Recent Labs    10/18/20 1930 05/28/21 1642 06/15/21 1511  BNP 47.4 101.8* 56.0    ProBNP (last 3 results) No results for input(s): PROBNP in the last 8760 hours.  CBG: Recent Labs  Lab 06/21/21 0440 06/22/21 0525 06/22/21 0527 06/23/21 0446 06/24/21 0357  GLUCAP 113* 99 95 195* 110*       Signed:  Irine Seal MD.  [facility] 06/24/2021, 3:37 PM

## 2021-06-24 NOTE — Progress Notes (Signed)
PT Cancellation Note  Patient Details Name: Jeremiah [name] MRN: [id number] DOB: 04-22-1951   Cancelled Treatment:     Therapist in to see pt this am, pt refused stating he was going home today and would not participate in any activity.   Josie Dixon 06/24/2021, 11:32 AM

## 2021-06-24 NOTE — Progress Notes (Signed)
EMS has transported patient to home on 4 liters of oxygen, no distress when leaving the floor.

## 2021-06-24 NOTE — Care Management Important Message (Signed)
Important Message  Patient Details  Name: Jeremiah [name] MRN: [id number] Date of Birth: [birth year]   Medicare Important Message Given:  Yes     Dannette Barbara 06/24/2021, 11:17 AM

## 2021-06-24 NOTE — Progress Notes (Signed)
Patient was given verbal and written discharge instructions, He acknowledges understanding, and states he will comply, waiting on EMS to transport to home.

## 2021-06-30 ENCOUNTER — Emergency Department: Payer: Medicare HMO

## 2021-06-30 ENCOUNTER — Other Ambulatory Visit: Payer: Self-pay

## 2021-06-30 ENCOUNTER — Encounter: Payer: Self-pay | Admitting: Internal Medicine

## 2021-06-30 ENCOUNTER — Inpatient Hospital Stay
Admission: EM | Admit: 2021-06-30 | Discharge: 2021-07-05 | DRG: 190 | Disposition: A | Discharge status: Home Health | Payer: Medicare HMO | Attending: Internal Medicine | Admitting: Internal Medicine

## 2021-06-30 DIAGNOSIS — I110 Hypertensive heart disease with heart failure: Secondary | ICD-10-CM | POA: Diagnosis present

## 2021-06-30 DIAGNOSIS — Z7951 Long term (current) use of inhaled steroids: Secondary | ICD-10-CM

## 2021-06-30 DIAGNOSIS — G4700 Insomnia, unspecified: Secondary | ICD-10-CM | POA: Diagnosis present

## 2021-06-30 DIAGNOSIS — Z9842 Cataract extraction status, left eye: Secondary | ICD-10-CM

## 2021-06-30 DIAGNOSIS — J441 Chronic obstructive pulmonary disease with (acute) exacerbation: Secondary | ICD-10-CM

## 2021-06-30 DIAGNOSIS — I5032 Chronic diastolic (congestive) heart failure: Secondary | ICD-10-CM | POA: Diagnosis not present

## 2021-06-30 DIAGNOSIS — Z961 Presence of intraocular lens: Secondary | ICD-10-CM | POA: Diagnosis present

## 2021-06-30 DIAGNOSIS — E785 Hyperlipidemia, unspecified: Secondary | ICD-10-CM | POA: Diagnosis present

## 2021-06-30 DIAGNOSIS — Z9841 Cataract extraction status, right eye: Secondary | ICD-10-CM | POA: Diagnosis not present

## 2021-06-30 DIAGNOSIS — E871 Hypo-osmolality and hyponatremia: Secondary | ICD-10-CM | POA: Diagnosis present

## 2021-06-30 DIAGNOSIS — L89312 Pressure ulcer of right buttock, stage 2: Secondary | ICD-10-CM | POA: Diagnosis present

## 2021-06-30 DIAGNOSIS — K219 Gastro-esophageal reflux disease without esophagitis: Secondary | ICD-10-CM | POA: Diagnosis present

## 2021-06-30 DIAGNOSIS — J438 Other emphysema: Principal | ICD-10-CM | POA: Diagnosis present

## 2021-06-30 DIAGNOSIS — R0602 Shortness of breath: Secondary | ICD-10-CM | POA: Diagnosis present

## 2021-06-30 DIAGNOSIS — Z8249 Family history of ischemic heart disease and other diseases of the circulatory system: Secondary | ICD-10-CM

## 2021-06-30 DIAGNOSIS — F17210 Nicotine dependence, cigarettes, uncomplicated: Secondary | ICD-10-CM | POA: Diagnosis present

## 2021-06-30 DIAGNOSIS — D509 Iron deficiency anemia, unspecified: Secondary | ICD-10-CM | POA: Diagnosis present

## 2021-06-30 DIAGNOSIS — Z79899 Other long term (current) drug therapy: Secondary | ICD-10-CM | POA: Diagnosis not present

## 2021-06-30 DIAGNOSIS — Z20822 Contact with and (suspected) exposure to covid-19: Secondary | ICD-10-CM | POA: Diagnosis present

## 2021-06-30 DIAGNOSIS — Z880 Allergy status to penicillin: Secondary | ICD-10-CM

## 2021-06-30 DIAGNOSIS — Z9981 Dependence on supplemental oxygen: Secondary | ICD-10-CM | POA: Diagnosis not present

## 2021-06-30 DIAGNOSIS — J9611 Chronic respiratory failure with hypoxia: Secondary | ICD-10-CM | POA: Diagnosis not present

## 2021-06-30 DIAGNOSIS — J9621 Acute and chronic respiratory failure with hypoxia: Secondary | ICD-10-CM | POA: Diagnosis not present

## 2021-06-30 DIAGNOSIS — J449 Chronic obstructive pulmonary disease, unspecified: Secondary | ICD-10-CM | POA: Diagnosis present

## 2021-06-30 DIAGNOSIS — E222 Syndrome of inappropriate secretion of antidiuretic hormone: Secondary | ICD-10-CM | POA: Diagnosis present

## 2021-06-30 DIAGNOSIS — L89152 Pressure ulcer of sacral region, stage 2: Secondary | ICD-10-CM | POA: Diagnosis present

## 2021-06-30 DIAGNOSIS — J189 Pneumonia, unspecified organism: Secondary | ICD-10-CM

## 2021-06-30 LAB — RETICULOCYTES
Immature Retic Fract: 6.9 % (ref 2.3–15.9)
RBC.: 3.19 MIL/uL — ABNORMAL LOW (ref 4.22–5.81)
Retic Count, Absolute: 85.5 10*3/uL (ref 19.0–186.0)
Retic Ct Pct: 2.7 % (ref 0.4–3.1)

## 2021-06-30 LAB — COMPREHENSIVE METABOLIC PANEL
ALT: 23 U/L (ref 0–44)
AST: 19 U/L (ref 15–41)
Albumin: 3.4 g/dL — ABNORMAL LOW (ref 3.5–5.0)
Alkaline Phosphatase: 120 U/L (ref 38–126)
Anion gap: 10 (ref 5–15)
BUN: 14 mg/dL (ref 8–23)
CO2: 26 mmol/L (ref 22–32)
Calcium: 8.6 mg/dL — ABNORMAL LOW (ref 8.9–10.3)
Chloride: 95 mmol/L — ABNORMAL LOW (ref 98–111)
Creatinine, Ser: 0.61 mg/dL (ref 0.61–1.24)
GFR, Estimated: >60 mL/min (ref >60)
Glucose, Bld: 155 mg/dL — ABNORMAL HIGH (ref 70–99)
Potassium: 4.1 mmol/L (ref 3.5–5.1)
Sodium: 131 mmol/L — ABNORMAL LOW (ref 135–145)
Total Bilirubin: 0.8 mg/dL (ref 0.3–1.2)
Total Protein: 6.9 g/dL (ref 6.5–8.1)

## 2021-06-30 LAB — PROTIME-INR
INR: 1.2 (ref 0.8–1.2)
Prothrombin Time: 14.7 seconds (ref 11.4–15.2)

## 2021-06-30 LAB — CBC WITH DIFFERENTIAL/PLATELET
Abs Immature Granulocytes: 0.07 10*3/uL (ref 0.00–0.07)
Basophils Absolute: 0 10*3/uL (ref 0.0–0.1)
Basophils Relative: 0 %
Eosinophils Absolute: 0 10*3/uL (ref 0.0–0.5)
Eosinophils Relative: 0 %
HCT: 31.7 % — ABNORMAL LOW (ref 39.0–52.0)
Hemoglobin: 10.3 g/dL — ABNORMAL LOW (ref 13.0–17.0)
Immature Granulocytes: 0 %
Lymphocytes Relative: 1 %
Lymphs Abs: 0.2 10*3/uL — ABNORMAL LOW (ref 0.7–4.0)
MCH: 27.7 pg (ref 26.0–34.0)
MCHC: 32.5 g/dL (ref 30.0–36.0)
MCV: 85.2 fL (ref 80.0–100.0)
Monocytes Absolute: 0.2 10*3/uL (ref 0.1–1.0)
Monocytes Relative: 1 %
Neutro Abs: 16.6 10*3/uL — ABNORMAL HIGH (ref 1.7–7.7)
Neutrophils Relative %: 98 %
Platelets: 426 10*3/uL — ABNORMAL HIGH (ref 150–400)
RBC: 3.72 MIL/uL — ABNORMAL LOW (ref 4.22–5.81)
RDW: 16.9 % — ABNORMAL HIGH (ref 11.5–15.5)
WBC: 17 10*3/uL — ABNORMAL HIGH (ref 4.0–10.5)
nRBC: 0 % (ref 0.0–0.2)

## 2021-06-30 LAB — CBC
HCT: 26.6 % — ABNORMAL LOW (ref 39.0–52.0)
Hemoglobin: 8.9 g/dL — ABNORMAL LOW (ref 13.0–17.0)
MCH: 27.7 pg (ref 26.0–34.0)
MCHC: 33.5 g/dL (ref 30.0–36.0)
MCV: 82.9 fL (ref 80.0–100.0)
Platelets: 362 10*3/uL (ref 150–400)
RBC: 3.21 MIL/uL — ABNORMAL LOW (ref 4.22–5.81)
RDW: 16.9 % — ABNORMAL HIGH (ref 11.5–15.5)
WBC: 12.3 10*3/uL — ABNORMAL HIGH (ref 4.0–10.5)
nRBC: 0 % (ref 0.0–0.2)

## 2021-06-30 LAB — CREATININE, SERUM
Creatinine, Ser: 0.4 mg/dL — ABNORMAL LOW (ref 0.61–1.24)
GFR, Estimated: >60 mL/min (ref >60)

## 2021-06-30 LAB — TSH: TSH: 1.22 u[IU]/mL (ref 0.350–4.500)

## 2021-06-30 LAB — LACTIC ACID, PLASMA
Lactic Acid, Venous: 2.5 mmol/L (ref 0.5–1.9)
Lactic Acid, Venous: 2 mmol/L (ref 0.5–1.9)

## 2021-06-30 LAB — IRON AND TIBC
Iron: 14 ug/dL — ABNORMAL LOW (ref 45–182)
Saturation Ratios: 5 % — ABNORMAL LOW (ref 17.9–39.5)
TIBC: 263 ug/dL (ref 250–450)
UIBC: 249 ug/dL

## 2021-06-30 LAB — D-DIMER, QUANTITATIVE: D-Dimer, Quant: 1.16 ug/mL-FEU — ABNORMAL HIGH (ref 0.00–0.50)

## 2021-06-30 LAB — CK: Total CK: 26 U/L — ABNORMAL LOW (ref 49–397)

## 2021-06-30 LAB — APTT: aPTT: 29 seconds (ref 24–36)

## 2021-06-30 LAB — RESP PANEL BY RT-PCR (FLU A&B, COVID) ARPGX2
Influenza A by PCR: NEGATIVE
Influenza B by PCR: NEGATIVE
SARS Coronavirus 2 by RT PCR: NEGATIVE

## 2021-06-30 LAB — PROCALCITONIN: Procalcitonin: <0.1 ng/mL

## 2021-06-30 LAB — BRAIN NATRIURETIC PEPTIDE: B Natriuretic Peptide: 64.7 pg/mL (ref 0.0–100.0)

## 2021-06-30 LAB — FOLATE: Folate: 11.3 ng/mL (ref >5.9)

## 2021-06-30 LAB — FERRITIN: Ferritin: 60 ng/mL (ref 24–336)

## 2021-06-30 MED ORDER — ENOXAPARIN SODIUM 40 MG/0.4ML IJ SOSY
40.0000 mg | PREFILLED_SYRINGE | INTRAMUSCULAR | Status: DC
  Administered 2021-06-30 – 2021-07-04 (×5): 40 mg via SUBCUTANEOUS
  Filled 2021-06-30 (×5): qty 0.4

## 2021-06-30 MED ORDER — ALBUTEROL SULFATE (2.5 MG/3ML) 0.083% IN NEBU: 2.5000 mg | INHALATION_SOLUTION | RESPIRATORY_TRACT | Status: DC | PRN

## 2021-06-30 MED ORDER — DULOXETINE HCL 30 MG PO CPEP
30.0000 mg | ORAL_CAPSULE | Freq: Every day | ORAL | Status: DC
  Administered 2021-07-01 – 2021-07-05 (×5): 30 mg via ORAL
  Filled 2021-06-30 (×5): qty 1

## 2021-06-30 MED ORDER — BUDESONIDE 0.25 MG/2ML IN SUSP
0.2500 mg | Freq: Two times a day (BID) | RESPIRATORY_TRACT | Status: DC
  Administered 2021-06-30 – 2021-07-05 (×8): 0.25 mg via RESPIRATORY_TRACT
  Filled 2021-06-30 (×8): qty 2

## 2021-06-30 MED ORDER — IPRATROPIUM BROMIDE 0.02 % IN SOLN: 0.5000 mg | Freq: Four times a day (QID) | RESPIRATORY_TRACT | Status: DC

## 2021-06-30 MED ORDER — DILTIAZEM HCL 30 MG PO TABS
120.0000 mg | ORAL_TABLET | Freq: Two times a day (BID) | ORAL | Status: DC
Start: 2021-06-30 — End: 2021-07-05
  Administered 2021-06-30 – 2021-07-05 (×10): 120 mg via ORAL
  Filled 2021-06-30 (×10): qty 4

## 2021-06-30 MED ORDER — TIOTROPIUM BROMIDE MONOHYDRATE 18 MCG IN CAPS
18.0000 ug | ORAL_CAPSULE | Freq: Every day | RESPIRATORY_TRACT | Status: DC
Start: 2021-07-01 — End: 2021-06-30

## 2021-06-30 MED ORDER — SODIUM CHLORIDE 0.9 % IV SOLN
1.0000 g | Freq: Once | INTRAVENOUS | Status: AC
  Administered 2021-06-30: 1 g via INTRAVENOUS
  Filled 2021-06-30: qty 10

## 2021-06-30 MED ORDER — NICOTINE 21 MG/24HR TD PT24
21.0000 mg | MEDICATED_PATCH | Freq: Once | TRANSDERMAL | Status: AC
  Administered 2021-06-30: 21 mg via TRANSDERMAL
  Filled 2021-06-30: qty 1

## 2021-06-30 MED ORDER — IPRATROPIUM-ALBUTEROL 0.5-2.5 (3) MG/3ML IN SOLN
3.0000 mL | Freq: Four times a day (QID) | RESPIRATORY_TRACT | Status: DC
  Administered 2021-06-30 – 2021-07-05 (×20): 3 mL via RESPIRATORY_TRACT
  Filled 2021-06-30 (×20): qty 3

## 2021-06-30 MED ORDER — MONTELUKAST SODIUM 10 MG PO TABS
10.0000 mg | ORAL_TABLET | Freq: Every day | ORAL | Status: DC
  Administered 2021-06-30 – 2021-07-04 (×5): 10 mg via ORAL
  Filled 2021-06-30 (×5): qty 1

## 2021-06-30 MED ORDER — SODIUM CHLORIDE 0.9 % IV SOLN
2.0000 g | INTRAVENOUS | Status: DC
  Filled 2021-06-30: qty 20

## 2021-06-30 MED ORDER — SODIUM CHLORIDE 0.9 % IV SOLN
500.0000 mg | INTRAVENOUS | Status: AC
  Administered 2021-07-01 – 2021-07-04 (×4): 500 mg via INTRAVENOUS
  Filled 2021-06-30 (×4): qty 5

## 2021-06-30 MED ORDER — FUROSEMIDE 10 MG/ML IJ SOLN
10.0000 mg | Freq: Once | INTRAMUSCULAR | Status: AC
  Administered 2021-06-30: 10 mg via INTRAVENOUS
  Filled 2021-06-30: qty 4

## 2021-06-30 MED ORDER — SODIUM CHLORIDE 0.9 % IV SOLN
500.0000 mg | Freq: Once | INTRAVENOUS | Status: AC
  Administered 2021-06-30: 500 mg via INTRAVENOUS
  Filled 2021-06-30: qty 5

## 2021-06-30 MED ORDER — ALBUTEROL SULFATE (2.5 MG/3ML) 0.083% IN NEBU: 2.5000 mg | INHALATION_SOLUTION | Freq: Four times a day (QID) | RESPIRATORY_TRACT | Status: DC

## 2021-06-30 MED ORDER — PANTOPRAZOLE SODIUM 20 MG PO TBEC
20.0000 mg | DELAYED_RELEASE_TABLET | Freq: Every day | ORAL | Status: DC
Start: 2021-07-01 — End: 2021-07-05
  Administered 2021-07-01 – 2021-07-05 (×5): 20 mg via ORAL
  Filled 2021-06-30 (×5): qty 1

## 2021-06-30 MED ORDER — ATORVASTATIN CALCIUM 20 MG PO TABS
20.0000 mg | ORAL_TABLET | Freq: Every day | ORAL | Status: DC
  Administered 2021-06-30 – 2021-07-05 (×6): 20 mg via ORAL
  Filled 2021-06-30 (×6): qty 1

## 2021-06-30 MED ORDER — SODIUM CHLORIDE 1 G PO TABS
2.0000 g | ORAL_TABLET | Freq: Two times a day (BID) | ORAL | Status: DC
  Administered 2021-07-01 – 2021-07-03 (×5): 2 g via ORAL
  Filled 2021-06-30 (×8): qty 2

## 2021-06-30 NOTE — H&P (Addendum)
History and Physical    Jeremiah [name] URK:[id number] DOB: Nov 12, 1950 DOA: 06/30/2021  PCP: [facility]  Patient coming from: Home.  Chief Complaint: Shortness of breath.  HPI: Jeremiah [name] is a 71 y.o. male with known history of COPD with recent 2D echo showing diastolic dysfunction, hypertension recently admitted for severe hyponatremia and also syncope was getting increasingly short of breath since discharge 5 days ago.  Patient states since his discharge he has been finding it difficult to ambulate without help.  And last few days got more short of breath with productive cough.  Denies chest pain fever or chills.  ED Course: In the ER patient was hypoxic requiring 4 L oxygen initially with chest x-ray showing bilateral infiltrates and pleural effusion.  Features are concerning for pneumonia.  Patient also was mildly wheezing.  Was placed on nebulizer and antibiotics.  Admitted for further work-up.  Labs show mildly elevated lactic acid procalcitonin negative COVID test negative.  WBC is elevated with 17,000.  Review of Systems: As per HPI, rest all negative.   Past Medical History:  Diagnosis Date   Acute and chronic respiratory failure (acute-on-chronic) ([place]) 10/18/2020   Asthma    COPD (chronic obstructive pulmonary disease) (HCC)    O2 dependent - 2L   GERD (gastroesophageal reflux disease)    HTN (hypertension)    Multifocal pneumonia 06/16/2020   Other emphysema ([place]) 08/22/2016   Oxygen dependent    Sepsis ([place]) 05/14/2020   Tobacco dependence     Past Surgical History:  Procedure Laterality Date   CATARACT EXTRACTION W/PHACO Left 12/09/2019   Procedure: CATARACT EXTRACTION PHACO AND INTRAOCULAR LENS PLACEMENT (I[facility]) COMPLICATED LEFT 83.15 17:61.6;  Surgeon: Birder Robson, MD;  Location: [facility];  Service: Ophthalmology;  Laterality: Left;  MILOOP VISION BLUE   CATARACT EXTRACTION W/PHACO Right 01/27/2020   Procedure: CATARACT  EXTRACTION PHACO AND INTRAOCULAR LENS PLACEMENT ([place]) RIGHT VISION BLUE 26.83 02:07.6;  Surgeon: Birder Robson, MD;  Location: [facility];  Service: Ophthalmology;  Laterality: Right;   HERNIA REPAIR     NECK SURGERY       reports that he has been smoking cigarettes. He has a 25.00 pack-year smoking history. He has never used smokeless tobacco. He reports current alcohol use. He reports that he does not use drugs.  Allergies  Allergen Reactions   Penicillins Anaphylaxis, Swelling and Other (See Comments)    Tolerated Ceftriaxone 06/2020 05/2020 childhood reaction and his mother told him that he "swelled up."  Had a PCN reaction causing immediate rash, facial/tongue/throat swelling, SOB or lightheadedness with hypotension: Yes Had a PCN reaction causing severe rash involving mucus membranes or skin necrosis: No Had a PCN reaction that required hospitalization No Had a PCN reaction occurring within the last 10 years: No If all the above answers are "NO", may proceed with Cephalosporin use.    Family History  Problem Relation Age of Onset   Cancer - Colon Father    Congestive Heart Failure Mother     Prior to Admission medications   Medication Sig Start Date End Date Taking? Authorizing Provider  acetaminophen (TYLENOL) 325 MG tablet Take 2 tablets (650 mg total) by mouth every 6 (six) hours as needed for mild pain (or Fever >/= 101). 08/06/15   Dustin Flock, MD  albuterol (VENTOLIN HFA) 108 (90 Base) MCG/ACT inhaler Inhale into the lungs every 6 (six) hours as needed for wheezing or shortness of breath.    [provider]  atorvastatin (LIPITOR) 20 MG tablet Take 20 mg by mouth daily.    [provider]  BREO ELLIPTA 100-25 MCG/INH AEPB Inhale 1 puff into the lungs daily. 09/30/20   Jennye Boroughs, MD  diltiazem (CARDIZEM) 120 MG tablet Take 1 tablet (120 mg total) by mouth 2 (two) times daily. 09/30/20   Jennye Boroughs, MD  DULoxetine (CYMBALTA) 30 MG  capsule Take 30 mg by mouth daily.    [provider]  furosemide (LASIX) 20 MG tablet Take 0.5 tablets (10 mg total) by mouth daily as needed for edema. 06/24/21   Eugenie Filler, MD  guaiFENesin-dextromethorphan (ROBITUSSIN DM) 100-10 MG/5ML syrup Take 5 mLs by mouth every 6 (six) hours as needed for cough.    [provider]  ipratropium-albuterol (DUONEB) 0.5-2.5 (3) MG/3ML SOLN Take 3 mLs by nebulization every 6 (six) hours as needed. 09/30/20   Jennye Boroughs, MD  montelukast (SINGULAIR) 10 MG tablet Take 1 tablet (10 mg total) by mouth at bedtime. 09/30/20   Jennye Boroughs, MD  mupirocin ointment (BACTROBAN) 2 % Apply 1 application topically 2 (two) times daily.    [provider]  nicotine (NICODERM CQ - DOSED IN MG/24 HOURS) 21 mg/24hr patch Place 1 patch (21 mg total) onto the skin daily. 10/23/20   Geradine Girt, DO  nicotine polacrilex (NICORETTE) 2 MG gum Take 1 each (2 mg total) by mouth as needed for smoking cessation. 10/23/20   Geradine Girt, DO  pantoprazole (PROTONIX) 20 MG tablet Take 1 tablet (20 mg total) by mouth daily. 09/30/20   Jennye Boroughs, MD  sodium chloride 1 g tablet Take 2 tablets (2 g total) by mouth 2 (two) times daily with a meal. 06/24/21   Eugenie Filler, MD  tiotropium (SPIRIVA) 18 MCG inhalation capsule Place 18 mcg into inhaler and inhale daily.    [provider]    Physical Exam: Constitutional: Moderately built and nourished. Vitals:   06/30/21 1630 06/30/21 1645 06/30/21 1700 06/30/21 1825  BP: 121/63  122/74 128/73  Pulse: 83 78 87 81  Resp: (!) 26 (!) 25 (!) 26 20  Temp:    98.5 F (36.9 C)  TempSrc:      SpO2: 95% 96% 95% 93%  Weight:       Eyes: Anicteric no pallor. ENMT: No discharge from the ears eyes nose and mouth. Neck: No mass felt.  No neck rigidity. Respiratory: Mild expiratory wheeze and no crepitations. Cardiovascular: S1-S2 heard. Abdomen: Soft nontender bowel sound  present. Musculoskeletal: No edema.  But patient is wearing stockings. Skin: Chronic skin changes. Neurologic: Alert awake oriented time place and person.  Moves all extremities. Psychiatric: Appears normal.  Normal affect.   Labs on Admission: I have personally reviewed following labs and imaging studies  CBC: Recent Labs  Lab 06/30/21 1452  WBC 17.0*  NEUTROABS 16.6*  HGB 10.3*  HCT 31.7*  MCV 85.2  PLT 025*   Basic Metabolic Panel: Recent Labs  Lab 06/24/21 0543 06/30/21 1452  NA 131* 131*  K 4.5 4.1  CL 91* 95*  CO2 36* 26  GLUCOSE 99 155*  BUN 26* 14  CREATININE 0.39* 0.61  CALCIUM 8.6* 8.6*   GFR: Estimated Creatinine Clearance: 83.7 mL/min (by C-G formula based on SCr of 0.61 mg/dL). Liver Function Tests: Recent Labs  Lab 06/30/21 1452  AST 19  ALT 23  ALKPHOS 120  BILITOT 0.8  PROT 6.9  ALBUMIN 3.4*   No results for input(s): LIPASE,  AMYLASE in the last 168 hours. No results for input(s): AMMONIA in the last 168 hours. Coagulation Profile: Recent Labs  Lab 06/30/21 1452  INR 1.2   Cardiac Enzymes: No results for input(s): CKTOTAL, CKMB, CKMBINDEX, TROPONINI in the last 168 hours. BNP (last 3 results) No results for input(s): PROBNP in the last 8760 hours. HbA1C: No results for input(s): HGBA1C in the last 72 hours. CBG: Recent Labs  Lab 06/24/21 0357  GLUCAP 110*   Lipid Profile: No results for input(s): CHOL, HDL, LDLCALC, TRIG, CHOLHDL, LDLDIRECT in the last 72 hours. Thyroid Function Tests: No results for input(s): TSH, T4TOTAL, FREET4, T3FREE, THYROIDAB in the last 72 hours. Anemia Panel: No results for input(s): VITAMINB12, FOLATE, FERRITIN, TIBC, IRON, RETICCTPCT in the last 72 hours. Urine analysis:    Component Value Date/Time   COLORURINE YELLOW (A) 06/15/2021 1720   APPEARANCEUR CLEAR (A) 06/15/2021 1720   APPEARANCEUR Clear 09/12/2013 0920   LABSPEC 1.018 06/15/2021 1720   LABSPEC 1.010 09/12/2013 0920   PHURINE 6.0  06/15/2021 1720   GLUCOSEU NEGATIVE 06/15/2021 1720   GLUCOSEU Negative 09/12/2013 0920   HGBUR NEGATIVE 06/15/2021 1720   BILIRUBINUR NEGATIVE 06/15/2021 1720   BILIRUBINUR Negative 09/12/2013 0920   KETONESUR NEGATIVE 06/15/2021 1720   PROTEINUR NEGATIVE 06/15/2021 1720   NITRITE NEGATIVE 06/15/2021 1720   LEUKOCYTESUR NEGATIVE 06/15/2021 1720   LEUKOCYTESUR Negative 09/12/2013 0920   Sepsis Labs: @LABRCNTIP (procalcitonin:4,lacticidven:4) ) Recent Results (from the past 240 hour(s))  Resp Panel by RT-PCR (Flu A&B, Covid) Nasopharyngeal Swab     Status: None   Collection Time: 06/30/21  2:52 PM   Specimen: Nasopharyngeal Swab; Nasopharyngeal(NP) swabs in vial transport medium  Result Value Ref Range Status   SARS Coronavirus 2 by RT PCR NEGATIVE NEGATIVE Final    Comment: (NOTE) SARS-CoV-2 target nucleic acids are NOT DETECTED.  The SARS-CoV-2 RNA is generally detectable in upper respiratory specimens during the acute phase of infection. The lowest concentration of SARS-CoV-2 viral copies this assay can detect is 138 copies/mL. A negative result does not preclude SARS-Cov-2 infection and should not be used as the sole basis for treatment or other patient management decisions. A negative result may occur with  improper specimen collection/handling, submission of specimen other than nasopharyngeal swab, presence of viral mutation(s) within the areas targeted by this assay, and inadequate number of viral copies(<138 copies/mL). A negative result must be combined with clinical observations, patient history, and epidemiological information. The expected result is Negative.  Fact Sheet for Patients:  EntrepreneurPulse.com.au  Fact Sheet for Healthcare Providers:  IncredibleEmployment.be  This test is no t yet approved or cleared by the [place] FDA and  has been authorized for detection and/or diagnosis of SARS-CoV-2 by FDA under an  Emergency Use Authorization (EUA). This EUA will remain  in effect (meaning this test can be used) for the duration of the COVID-19 declaration under Section 564(b)(1) of the Act, 21 U.S.C.section 360bbb-3(b)(1), unless the authorization is terminated  or revoked sooner.       Influenza A by PCR NEGATIVE NEGATIVE Final   Influenza B by PCR NEGATIVE NEGATIVE Final    Comment: (NOTE) The Xpert Xpress SARS-CoV-2/FLU/RSV plus assay is intended as an aid in the diagnosis of influenza from Nasopharyngeal swab specimens and should not be used as a sole basis for treatment. Nasal washings and aspirates are unacceptable for Xpert Xpress SARS-CoV-2/FLU/RSV testing.  Fact Sheet for Patients: EntrepreneurPulse.com.au  Fact Sheet for Healthcare Providers: IncredibleEmployment.be  This test is not  yet approved or cleared by the [place] and has been authorized for detection and/or diagnosis of SARS-CoV-2 by FDA under an Emergency Use Authorization (EUA). This EUA will remain in effect (meaning this test can be used) for the duration of the COVID-19 declaration under Section 564(b)(1) of the Act, 21 U.S.C. section 360bbb-3(b)(1), unless the authorization is terminated or revoked.  Performed at [facility], [address] [place], [place] [address]      Radiological Exams on Admission: DG Chest [facility] 1 View  Result Date: 06/30/2021 CLINICAL DATA:  Questionable sepsis.  Shortness of breath. EXAM: PORTABLE CHEST 1 VIEW COMPARISON:  06/15/2021 FINDINGS: The cardiomediastinal silhouette is grossly unchanged, although the cardiac silhouette is partially obscured. Aortic atherosclerosis is noted. There is underlying emphysema with chronic interstitial coarsening, however increased interstitial and patchy airspace opacities are present in the right greater than left lung bases compared to the prior study. There is a small right pleural effusion.  No pneumothorax is identified. IMPRESSION: COPD with new bibasilar opacities concerning for pneumonia. Small right pleural effusion. Electronically Signed   By: Logan Bores M.D.   On: 06/30/2021 14:50    EKG: Independently reviewed.  Normal sinus rhythm.  Assessment/Plan Principal Problem:   CAP (community acquired pneumonia) Active Problems:   Acute on chronic respiratory failure with hypoxia (HCC)   COPD, moderate (HCC)   Hyponatremia   COPD (chronic obstructive pulmonary disease) (HCC)    Shortness of breath likely from mild fluid overload with chest x-ray showing pleural effusion and also possibility of bronchitis versus pneumonia.  Though procalcitonin is negative patient is having persistent pleuritic cough for which we will continue antibiotics.  I have ordered 1 dose of Lasix.  Check BNP D-dimer follow respiratory status.  Recent 2D echo done on June 15, 2021 showed EF of 55 to 88% grade 1 diastolic dysfunction.  Continue nebulizer. SIADH recently admitted for severe hyponatremia.  We will continue with fluid restriction and sodium tablets.  Follow metabolic panel. Generalized weakness with difficulty ambulating.  Check MRI brain.  Physical therapy consult.  Follow B12 level TSH CK levels. Chronic anemia follow CBC.  Since patient is having increasing weakness we will check anemia panel. Hypertension on Cardizem.  Patient used to be on lisinopril which was discontinued last week. Hyperlipidemia on statins.  Since patient has pneumonia and possible fluid overload will need close monitoring and further management inpatient status.   DVT prophylaxis: Lovenox. Code Status: Full code. Family Communication: Discussed with patient. Disposition Plan: To be determined. Consults called: Physical therapy. Admission status: Inpatient.   Rise Patience MD [facility] Pager [phone].  If 7PM-7AM, please contact night-coverage [email or web address] Password  [facility]  06/30/2021, 7:42 PM

## 2021-06-30 NOTE — ED Provider Notes (Signed)
----------------------------------------- °  4:02 PM on 06/30/2021 ----------------------------------------- Patient care assumed from Dr. Leory Plowman.  Patient's white blood cell count has resulted elevated at 17,000, chemistry is largely within normal limits including normal renal function.  Lactic acid of 2.5.  Given the patient's elevated white blood cell count initial hypoxia and tachypnea he does meet SIRS criteria.  Patient has been started on broad-spectrum antibiotics covering for possible pneumonia as seen on the chest x-ray.  We will admit to the hospitalist service for continued IV antibiotics and further treatment of COPD/pneumonia.   Harvest Dark, MD 06/30/21 [phone]

## 2021-06-30 NOTE — ED Provider Notes (Signed)
[facility] Provider Note   Event Date/Time   First MD Initiated Contact with Patient 06/30/21 1429     (approximate) History  Shortness of Breath  HPI Jeremiah [name] is a 71 y.o. male with a stated past medical history of poorly controlled COPD who presents for worsening shortness of breath.  Patient states that over the last few days he has had worsening productive cough, shortness of breath, and dyspnea on exertion.  EMS noted patient's oxygenation to be rather labile going down into the 80s when walking and placed patient on 4 L nasal cannula for comfort.  Patient states he has also had subjective fevers since last night as well as mild chest tightness.  Patient states that he has been using all of his medications on time and as prescribed.  Patient denies any recent travel or sick contacts Physical Exam  Triage Vital Signs: ED Triage Vitals  Enc Vitals Group     BP 06/30/21 1426 119/70     Pulse Rate 06/30/21 1426 85     Resp 06/30/21 1426 17     Temp 06/30/21 1426 98.5 F (36.9 C)     Temp Source 06/30/21 1426 Oral     SpO2 06/30/21 1426 97 %     Weight 06/30/21 1427 151 lb 14.4 oz (68.9 kg)     Height --      Head Circumference --      Peak Flow --      Pain Score 06/30/21 1427 0     Pain Loc --      Pain Edu? --      Excl. in [place]? --    Most recent vital signs: Vitals:   06/30/21 1426  BP: 119/70  Pulse: 85  Resp: 17  Temp: 98.5 F (36.9 C)  SpO2: 97%   General: Awake, oriented x4. CV:  Good peripheral perfusion.  Resp:  Increased effort.  Rales and expiratory wheezes over bilateral lung fields Abd:  No distention.  Other:  Elderly chronically ill-appearing Caucasian male sitting in bed in mild respiratory distress and nasal cannula in place ED Results / Procedures / Treatments  Labs (all labs ordered are listed, but only abnormal results are displayed) Labs Reviewed  CBC WITH DIFFERENTIAL/PLATELET - Abnormal; Notable for the  following components:      Result Value   WBC 17.0 (*)    RBC 3.72 (*)    Hemoglobin 10.3 (*)    HCT 31.7 (*)    RDW 16.9 (*)    Platelets 426 (*)    Neutro Abs 16.6 (*)    Lymphs Abs 0.2 (*)    All other components within normal limits  CULTURE, BLOOD (ROUTINE X 2)  CULTURE, BLOOD (ROUTINE X 2)  URINE CULTURE  RESP PANEL BY RT-PCR (FLU A&B, COVID) ARPGX2  LACTIC ACID, PLASMA  LACTIC ACID, PLASMA  COMPREHENSIVE METABOLIC PANEL  PROTIME-INR  APTT  URINALYSIS, COMPLETE (UACMP) WITH MICROSCOPIC  PROCALCITONIN   EKG ED ECG REPORT I, Naaman Plummer, the attending physician, personally viewed and interpreted this ECG. Date: 06/30/2021 EKG Time: 1423 Rate: 79 Rhythm: normal sinus rhythm QRS Axis: normal Intervals: normal ST/T Wave abnormalities: normal Narrative Interpretation: no evidence of acute ischemia RADIOLOGY ED MD interpretation: Single view portable chest x-ray as interpreted by me shows COPD with new bibasilar opacities concerning for pneumonia as well as a small right pleural effusion -Agree with radiology assessment Official radiology report(s): DG Chest [facility]  Result Date: 06/30/2021 CLINICAL DATA:  Questionable sepsis.  Shortness of breath. EXAM: PORTABLE CHEST 1 VIEW COMPARISON:  06/15/2021 FINDINGS: The cardiomediastinal silhouette is grossly unchanged, although the cardiac silhouette is partially obscured. Aortic atherosclerosis is noted. There is underlying emphysema with chronic interstitial coarsening, however increased interstitial and patchy airspace opacities are present in the right greater than left lung bases compared to the prior study. There is a small right pleural effusion. No pneumothorax is identified. IMPRESSION: COPD with new bibasilar opacities concerning for pneumonia. Small right pleural effusion. Electronically Signed   By: Logan Bores M.D.   On: 06/30/2021 14:50   PROCEDURES: Critical Care performed: Yes, see critical care procedure  note(s) Procedures MEDICATIONS ORDERED IN ED: Medications  cefTRIAXone (ROCEPHIN) 1 g in sodium chloride 0.9 % 100 mL IVPB (has no administration in time range)  azithromycin (ZITHROMAX) 500 mg in sodium chloride 0.9 % 250 mL IVPB (has no administration in time range)   IMPRESSION / MDM / ASSESSMENT AND PLAN / ED COURSE  I reviewed the triage vital signs and the nursing notes.                             The patient is on the cardiac monitor to evaluate for evidence of arrhythmia and/or significant heart rate changes. Presents with shortness of breath, cough, and malaise concerning for pneumonia.  DDx: PE, COPD exacerbation, Pneumothorax, TB, Atypical ACS, Esophageal Rupture, Toxic Exposure, Foreign Body Airway Obstruction.  Workup: CXR CBC, CMP, lactate, troponin  Given History, Exam, and Workup presentation most consistent with pneumonia.  Findings: Bilateral pneumonia as shown on chest x-ray Laboratory evaluation pending at the end of my shift  Care of this patient will be signed out to the oncoming physician at the end of my shift.  All pertinent patient information conveyed and all questions answered.  All further care and disposition decisions will be made by the oncoming physician.    FINAL CLINICAL IMPRESSION(S) / ED DIAGNOSES   Final diagnoses:  SOB (shortness of breath)  Community acquired pneumonia, unspecified laterality   Rx / DC Orders   ED Discharge Orders     None      Note:  This document was prepared using Dragon voice recognition software and may include unintentional dictation errors.   Naaman Plummer, MD 06/30/21 1515

## 2021-06-30 NOTE — Plan of Care (Signed)

## 2021-06-30 NOTE — ED Triage Notes (Signed)
Pt arrives to ED via EMS from home. Pt was recently d/c for low sodium. Pt sts that he is getting more SOB since d/c but he is now getting weak and not able to stand. Pt  is currently on oxygen via [place]. Provider at bedside.

## 2021-07-01 DIAGNOSIS — J9611 Chronic respiratory failure with hypoxia: Secondary | ICD-10-CM

## 2021-07-01 DIAGNOSIS — I5032 Chronic diastolic (congestive) heart failure: Secondary | ICD-10-CM

## 2021-07-01 LAB — COMPREHENSIVE METABOLIC PANEL
ALT: 19 U/L (ref 0–44)
AST: 14 U/L — ABNORMAL LOW (ref 15–41)
Albumin: 2.8 g/dL — ABNORMAL LOW (ref 3.5–5.0)
Alkaline Phosphatase: 116 U/L (ref 38–126)
Anion gap: 9 (ref 5–15)
BUN: 19 mg/dL (ref 8–23)
CO2: 28 mmol/L (ref 22–32)
Calcium: 8.2 mg/dL — ABNORMAL LOW (ref 8.9–10.3)
Chloride: 97 mmol/L — ABNORMAL LOW (ref 98–111)
Creatinine, Ser: 0.4 mg/dL — ABNORMAL LOW (ref 0.61–1.24)
GFR, Estimated: >60 mL/min (ref >60)
Glucose, Bld: 101 mg/dL — ABNORMAL HIGH (ref 70–99)
Potassium: 3.5 mmol/L (ref 3.5–5.1)
Sodium: 134 mmol/L — ABNORMAL LOW (ref 135–145)
Total Bilirubin: 0.5 mg/dL (ref 0.3–1.2)
Total Protein: 6.2 g/dL — ABNORMAL LOW (ref 6.5–8.1)

## 2021-07-01 LAB — CBC
HCT: 27.7 % — ABNORMAL LOW (ref 39.0–52.0)
Hemoglobin: 9.2 g/dL — ABNORMAL LOW (ref 13.0–17.0)
MCH: 27.5 pg (ref 26.0–34.0)
MCHC: 33.2 g/dL (ref 30.0–36.0)
MCV: 82.7 fL (ref 80.0–100.0)
Platelets: 360 10*3/uL (ref 150–400)
RBC: 3.35 MIL/uL — ABNORMAL LOW (ref 4.22–5.81)
RDW: 17.1 % — ABNORMAL HIGH (ref 11.5–15.5)
WBC: 10.3 10*3/uL (ref 4.0–10.5)
nRBC: 0 % (ref 0.0–0.2)

## 2021-07-01 LAB — HIV ANTIBODY (ROUTINE TESTING W REFLEX): HIV Screen 4th Generation wRfx: NONREACTIVE

## 2021-07-01 LAB — VITAMIN B12: Vitamin B-12: 343 pg/mL (ref 180–914)

## 2021-07-01 MED ORDER — POTASSIUM CHLORIDE 20 MEQ PO PACK
40.0000 meq | PACK | Freq: Once | ORAL | Status: AC
Start: 2021-07-01 — End: 2021-07-01
  Administered 2021-07-01: 40 meq via ORAL
  Filled 2021-07-01: qty 2

## 2021-07-01 MED ORDER — NICOTINE 21 MG/24HR TD PT24: 21.0000 mg | MEDICATED_PATCH | Freq: Every day | TRANSDERMAL | Status: DC

## 2021-07-01 MED ORDER — METHYLPREDNISOLONE SODIUM SUCC 40 MG IJ SOLR
40.0000 mg | INTRAMUSCULAR | Status: DC
  Administered 2021-07-01 – 2021-07-04 (×4): 40 mg via INTRAVENOUS
  Filled 2021-07-01 (×4): qty 1

## 2021-07-01 MED ORDER — NICOTINE 21 MG/24HR TD PT24
21.0000 mg | MEDICATED_PATCH | Freq: Every day | TRANSDERMAL | Status: DC
  Administered 2021-07-01 – 2021-07-05 (×5): 21 mg via TRANSDERMAL
  Filled 2021-07-01 (×5): qty 1

## 2021-07-01 MED ORDER — SODIUM CHLORIDE 0.9 % IV SOLN
250.0000 mg | Freq: Once | INTRAVENOUS | Status: AC
  Administered 2021-07-01: 250 mg via INTRAVENOUS
  Filled 2021-07-01: qty 20

## 2021-07-01 NOTE — Progress Notes (Signed)
°  Progress Note   Patient: Jeremiah [name] TAV:[id number] DOB: May 08, 1951 DOA: 06/30/2021     1 DOS: the patient was seen and examined on 07/01/2021   Brief hospital course: Javon Snee is a 71 y.o. male with COPD, chronic hypoxemia on 4 L oxygen, chronic diastolic congestive heart failure, essential hypertension, who came to the hospital with a short of breath and wheezing. Patient just left hospital on Friday after admission for hyponatremia.  Assessment and Plan:  COPD exacerbation. Chronic hypoxemic respiratory failure. Patient had increased short of breath and wheezing since discharge from hospital.  He also has significant bronchospasm and yellow mucus. I reviewed patient chest x-ray and compared to prior CT scan and x-ray, there is significant right lower lobe infiltrates, but not much change compared to previous images. I am concerned for chronic aspiration.  Will obtain speech therapy evaluation. Patient does not have exacerbation congestive heart failure, BNP was normal. I will start IV steroids, Zithromax. Procalcitonin level not elevated, no additional antibiotics needed.  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 Continue salt tablets with the fluid restriction.  Severe iron deficient anemia. Give IV iron, currently no evidence of active bleeding.  Essential hypertension Continue diltiazem.  Diastolic congestive heart failure. No exacerbation.    Subjective:  Patient currently on 4 L oxygen, which is baseline.  He still has signal short of breath with exertion.  Cough, with yellow mucus. No fever chills  He denies any dysphagia.  No nausea vomiting.  Physical Exam: Vitals:   06/30/21 2053 07/01/21 0216 07/01/21 0347 07/01/21 0807  BP:   (!) 112/58 114/61  Pulse:   72 79  Resp:   20 16  Temp:   98 F (36.7 C) 97.9 F (36.6 C)  TempSrc:      SpO2: 95% 99% 97% 99%  Weight:      Height:       General exam: Appears calm and comfortable   Respiratory system: Diffuse rhonchi.  Respiratory effort normal. Cardiovascular system: S1 & S2 heard, RRR. No JVD, murmurs, rubs, gallops or clicks. No pedal edema. Gastrointestinal system: Abdomen is nondistended, soft and nontender. No organomegaly or masses felt. Normal bowel sounds heard. Central nervous system: Alert and oriented. No focal neurological deficits. Extremities: Symmetric 5 x 5 power. Skin: No rashes, lesions or ulcers Psychiatry: Judgement and insight appear normal. Mood & affect appropriate.    Data Reviewed: Reviewed results as above.  Family Communication:   Disposition: Status is: Inpatient Remains inpatient appropriate because: Severity disease, IV treatment.          Planned Discharge Destination: Home     Time spent: no charge  Author: Sharen Hones, MD 07/01/2021 11:17 AM  For on call review [email or web address].

## 2021-07-01 NOTE — Progress Notes (Signed)
Home Monday

## 2021-07-01 NOTE — TOC Initial Note (Signed)
Transition of Care [facility]) - Initial/Assessment Note    Patient Details  Name: Jeremiah [name] MRN: [id number] Date of Birth: [birth year]  [facility] CM/SW Contact:    Eileen Stanford, LCSW Phone Number: 07/01/2021, 2:21 PM  Clinical Narrative:    CSW spoke with pt and reiterated that again this admission PT is recommending SNF. Pt is still refusing stating he is going to dc home again. Pt states  [facility] is planning to come to the home and work with him.             Expected Discharge Plan: [place] Barriers to Discharge: Continued Medical Work up   Patient Goals and CMS Choice Patient states their goals for this hospitalization and ongoing recovery are:: to return home   Choice offered to / list presented to : Patient  Expected Discharge Plan and Services Expected Discharge Plan: [place] Acute Care Choice: [place] arrangements for the past 2 months: Single Family Home                                      Prior Living Arrangements/Services Living arrangements for the past 2 months: Single Family Home Lives with:: Friends Patient language and need for interpreter reviewed:: Yes        Need for Family Participation in Patient Care: Yes (Comment) Care giver support system in place?: Yes (comment)   Criminal Activity/Legal Involvement Pertinent to Current Situation/Hospitalization: No - Comment as needed  Activities of Daily Living Home Assistive Devices/Equipment: None ADL Screening (condition at time of admission) Patient's cognitive ability adequate to safely complete daily activities?: Yes Is the patient deaf or have difficulty hearing?: No Does the patient have difficulty seeing, even when wearing glasses/contacts?: No Does the patient have difficulty concentrating, remembering, or making decisions?: No Patient able to express need for assistance with ADLs?: Yes Does the patient have  difficulty dressing or bathing?: Yes Independently performs ADLs?: No Communication: Independent Dressing (OT): Needs assistance Is this a change from baseline?: Pre-admission baseline Grooming: Needs assistance Is this a change from baseline?: Pre-admission baseline Feeding: Independent Bathing: Needs assistance Is this a change from baseline?: Pre-admission baseline Toileting: Needs assistance Is this a change from baseline?: Pre-admission baseline In/Out Bed: Dependent Is this a change from baseline?: Pre-admission baseline Walks in Home: Needs assistance Is this a change from baseline?: Pre-admission baseline Does the patient have difficulty walking or climbing stairs?: Yes Weakness of Legs: Both Weakness of Arms/Hands: None  Permission Sought/Granted Permission sought to share information with : Family Supports Permission granted to share information with : Yes, Verbal Permission Granted  Share Information with NAME: [name]     Permission granted to share info w Relationship: friend     Emotional Assessment Appearance:: Appears stated age Attitude/Demeanor/Rapport: Engaged Affect (typically observed): Accepting Orientation: : Oriented to Situation, Oriented to  Time, Oriented to Place, Oriented to Self Alcohol / Substance Use: Not Applicable Psych Involvement: No (comment)  Admission diagnosis:  SOB (shortness of breath) [R06.02] CAP (community acquired pneumonia) [J18.9] Acute on chronic respiratory failure with hypoxia (HCC) [J96.21] Community acquired pneumonia, unspecified laterality [J18.9] COPD (chronic obstructive pulmonary disease) (HCC) [J44.9] Patient Active Problem List   Diagnosis Date Noted   Chronic diastolic CHF (congestive heart failure) ([place]) 07/01/2021   Chronic hypoxemic respiratory failure ([place]) 07/01/2021  CAP (community acquired pneumonia) 06/30/2021   COPD with acute exacerbation ([place]) 06/30/2021   Thrush, oral 06/16/2021   Unintentional  weight loss 06/16/2021   Pressure injury of skin 06/16/2021   Protein-calorie malnutrition, severe 06/16/2021   Dehydration    Syncope 06/15/2021   Malnutrition of moderate degree 05/31/2021   GERD (gastroesophageal reflux disease) 05/28/2021   Acute respiratory failure with hypoxia ([place]) 05/28/2021   Anemia 10/18/2020   COPD, moderate ([place]) 05/14/2020   Nicotine dependence 05/14/2020   Hyponatremia 05/14/2020   HTN (hypertension) 05/14/2020   Abnormal LFTs 05/14/2020   Acute on chronic respiratory failure with hypoxia ([place]) 10/15/2016   Tobacco dependency 10/15/2016   PCP:  [place]:   [facility] [address] [place] [address] Phone: [phone] Fax: [place], [place] [address] Phone: [phone] Fax: [phone]  [place] [address], [place] [address] Phone: [phone] Fax: [phone]     Social Determinants of Health (SDOH) Interventions    Readmission Risk Interventions Readmission Risk Prevention Plan 10/21/2020 09/27/2020 06/17/2020  Transportation Screening Complete Complete Complete  PCP or Specialist Appt within 3-5 Days Complete Complete Complete  HRI or Home Care Consult Complete Complete Complete  Social Work Consult for [place] Planning/Counseling Complete Not Complete Complete  SW consult not completed comments - RNCM assigned to patient -  Palliative Care Screening Not Applicable Not Applicable Not Applicable  Medication Review (RN Care Manager) Complete Complete Complete  Some recent data might be hidden

## 2021-07-01 NOTE — Evaluation (Signed)
Clinical/Bedside Swallow Evaluation Patient Details  Name: Jeremiah [name] MRN: [id number] Date of Birth: [birth year]  Today's Date: 07/01/2021 Time: SLP Start Time (ACUTE ONLY): 1700 SLP Stop Time (ACUTE ONLY): 1720 SLP Time Calculation (min) (ACUTE ONLY): 20 min  Past Medical History:  Past Medical History:  Diagnosis Date   Acute and chronic respiratory failure (acute-on-chronic) ([place]) 10/18/2020   Asthma    COPD (chronic obstructive pulmonary disease) (HCC)    O2 dependent - 2L   GERD (gastroesophageal reflux disease)    HTN (hypertension)    Multifocal pneumonia 06/16/2020   Other emphysema ([place]) 08/22/2016   Oxygen dependent    Sepsis ([place]) 05/14/2020   Tobacco dependence    Past Surgical History:  Past Surgical History:  Procedure Laterality Date   CATARACT EXTRACTION W/PHACO Left 12/09/2019   Procedure: CATARACT EXTRACTION PHACO AND INTRAOCULAR LENS PLACEMENT (IOC) COMPLICATED LEFT 32.95 18:84.1;  Surgeon: Birder Robson, MD;  Location: [place];  Service: Ophthalmology;  Laterality: Left;  MILOOP VISION BLUE   CATARACT EXTRACTION W/PHACO Right 01/27/2020   Procedure: CATARACT EXTRACTION PHACO AND INTRAOCULAR LENS PLACEMENT ([place]) RIGHT VISION BLUE 26.83 02:07.6;  Surgeon: Birder Robson, MD;  Location: [facility];  Service: Ophthalmology;  Laterality: Right;   HERNIA REPAIR     NECK SURGERY     HPI:  Jeremiah [name] is a 71 y.o. male with known history of COPD with recent 2D echo showing diastolic dysfunction, hypertension recently admitted for severe hyponatremia and also syncope was getting increasingly short of breath since discharge 5 days ago.  Patient states since his discharge he has been finding it difficult to ambulate without help.  And last few days got more short of breath with productive cough.  Denies chest pain fever or chills. DG Chest 06/30/21: COPD with new bibasilar opacities concerning for pneumonia. Small right pleural effusion.  (Comparison to DG Chest 06/15/21: Emphysema and chronic bronchitic changes without acute airspace disease). Pt last seen for a bed side swallow assessment on 09/28/2020, with report indicating "grossly adequate oropharyngeal phase swallowing function w/ No overt oropharyngeal phase dysphagia appreciated w/ trials accepted; no neuromuscular swallowing deficits appreciated." RN denying issue with intake during shift. MD progress note from this date reporting "I am concerned for chronic aspiration." Pt currently on a regular solids and thin liquids diet. No temperature currently and WBC trending down at 10.3. Pt sitting upright in bed on 3L O2 via nasal canula (baseline 4L O2 at home). Agreeable to bedside swallow assessment.    Assessment / Plan / Recommendation  Clinical Impression  Pt present with suspected functional oropharyngeal swallow as indicated by completion of bedside swallow assessment. Therapist assisting pt to achieve fully upright positioning for purposes of PO intake. Pt with audible congestion with baseline cough/throat clear (noted before, during, and assessment). Pt saturations remaining 95-97 t/o assessment with aid of supplemental O2. No s/sx of aspiration across trials of regular solids and thin liquids (via cup/straw). Oral phase mildly prolonged 2/2 baseline edentulous nature. Education provided for risk of aspiration in the setting of deconditioning/hx of GERD and need for aspiration/esophageal precautions (slow rate, small bites, elevated HOB, alert for PO intake, monitoring for globus sensation, frequent small meals, and remaining upright at least 30 min following meal). Pt reported understanding.   Recommend continued regular solids and thin liquids with aspiration/esophageal precautions as stated above. MD reporting concern for silent, chronic aspiration in the setting of current imaging. Recommend completion of MBSS for further assessment of pharyngeal  phase. MD in agreement with  plan. SLP Visit Diagnosis: Dysphagia, unspecified (R13.10)    Aspiration Risk  Mild aspiration risk    Diet Recommendation     Medication Administration: Whole meds with liquid    Other  Recommendations Oral Care Recommendations: Oral care BID    Recommendations for follow up therapy are one component of a multi-disciplinary discharge planning process, led by the attending physician.  Recommendations may be updated based on patient status, additional functional criteria and insurance authorization.  Follow up Recommendations  (TBD)      Assistance Recommended at Discharge    Functional Status Assessment Patient has had a recent decline in their functional status and/or demonstrates limited ability to make significant improvements in function in a reasonable and predictable amount of time  Frequency and Duration min 1 x/week  2 weeks       Prognosis        Swallow Study   General Date of Onset: 07/01/21 HPI: Jeremiah [name] is a 71 y.o. male with known history of COPD with recent 2D echo showing diastolic dysfunction, hypertension recently admitted for severe hyponatremia and also syncope was getting increasingly short of breath since discharge 5 days ago.  Patient states since his discharge he has been finding it difficult to ambulate without help.  And last few days got more short of breath with productive cough.  Denies chest pain fever or chills. DG Chest 06/30/21: COPD with new bibasilar opacities concerning for pneumonia. Small right pleural effusion. (Comparison to DG Chest 06/15/21: Emphysema and chronic bronchitic changes without acute airspace disease). Pt last seen for a bed side swallow assessment on 09/28/2020, with report indicating "grossly adequate oropharyngeal phase swallowing function w/ No overt oropharyngeal phase dysphagia appreciated w/ trials accepted; no neuromuscular swallowing deficits appreciated." RN denying issue with intake during shift. MD progress note  from this date reporting "I am concerned for chronic aspiration." Pt currently on a regular solids and thin liquids diet. No temperature currently and WBC trending down at 10.3. Pt sitting upright in bed on 3L O2 via nasal canula (baseline 4L O2 at home). Agreeable to bedside swallow assessment. Type of Study: Bedside Swallow Evaluation Previous Swallow Assessment: Pt last seen for a bed side swallow assessment on 09/28/2020, with report indicating "grossly adequate oropharyngeal phase swallowing function w/ No overt oropharyngeal phase dysphagia appreciated w/ trials accepted; no neuromuscular swallowing deficits appreciated." Diet Prior to this Study: Regular;Thin liquids Temperature Spikes Noted: No Respiratory Status: Nasal cannula History of Recent Intubation: No Behavior/Cognition: Alert;Cooperative Oral Cavity Assessment: Within Functional Limits Oral Care Completed by SLP: Yes Oral Cavity - Dentition: Edentulous Vision: Functional for self-feeding Self-Feeding Abilities: Able to feed self Patient Positioning: Upright in bed Baseline Vocal Quality: Normal Volitional Cough: Congested Volitional Swallow: Able to elicit    Oral/Motor/Sensory Function Overall Oral Motor/Sensory Function: Within functional limits   Ice Chips Ice chips: Not tested   Thin Liquid Thin Liquid: Within functional limits    Nectar Thick Nectar Thick Liquid: Not tested   Honey Thick Honey Thick Liquid: Not tested   Puree Puree: Not tested   Solid     Solid: Within functional limits      [place] J Clapp 07/01/2021,7:28 PM

## 2021-07-01 NOTE — Evaluation (Signed)
Physical Therapy Evaluation Patient Details Name: Jeremiah [name] MRN: [id number] DOB: 03-12-1951 Today's Date: 07/01/2021  History of Present Illness  Pt is a 71 y/o M admitted on 06/30/21 after presenting with c/o SOB. Chest x-ray showing bilateral infiltrates and pleural effusion. Pt is being treated Shortness of breath likely from mild fluid overload with chest x-ray showing pleural effusion and also possibility of bronchitis versus pneumonia. Pt with recent admission for severe hyponatremia & syncope & was getting increasingly SOB since d/c 5 days ago; pt also reports increasing difficulty with ambulating without assistance. PMH: COPD, HTN, acute & chronic respiratory failure, asthma, GERD, tobacco dependence  Clinical Impression  Pt seen for PT evaluation with pt reporting prior to admission he was residing in a mobile home with 10 steps to enter with friends/roommates, ambulating without AD & denying falls. On this date, pt relies on hospital bed features to complete supine>sit without physical assistance but requires min assist for sit>stand & stand pivot bed>recliner, recliner>BSC without AD. Pt requires increased O2 from 3L to 4L/min via nasal cannula as pt dropped to 86% after transfer on 3L/min (pt reports he's on 4L/min at home). Pt attempts marching in place but pt limited by incontinent BM & pt assisted on BSC. Pt would benefit from STR upon d/c to maximize independence with functional mobility & reduce fall risk prior to return home.        Recommendations for follow up therapy are one component of a multi-disciplinary discharge planning process, led by the attending physician.  Recommendations may be updated based on patient status, additional functional criteria and insurance authorization.  Follow Up Recommendations Skilled nursing-short term rehab (<3 hours/day)    Assistance Recommended at Discharge Intermittent Supervision/Assistance  Patient can return home with the  following  A little help with bathing/dressing/bathroom;Assistance with cooking/housework;Direct supervision/assist for medications management;Assist for transportation;Help with stairs or ramp for entrance;A little help with walking and/or transfers    Equipment Recommendations Rolling walker (2 wheels)  Recommendations for Other Services       Functional Status Assessment Patient has had a recent decline in their functional status and demonstrates the ability to make significant improvements in function in a reasonable and predictable amount of time.     Precautions / Restrictions Precautions Precautions: Fall Restrictions Weight Bearing Restrictions: No      Mobility  Bed Mobility   Bed Mobility: Supine to Sit     Supine to sit: Supervision, HOB elevated     General bed mobility comments: use of bed rails    Transfers Overall transfer level: Needs assistance Equipment used: None Transfers: Sit to/from Stand, Bed to chair/wheelchair/BSC Sit to Stand: Min assist (pt requesting for PT to elevate EOB & seat height with PT only doing so ever so minimally to focus on BLE strengthening & transfers from average furniture height)   Step pivot transfers: Min assist (bed>recliner, recliner>BSC)            Ambulation/Gait                  Stairs            Wheelchair Mobility    Modified Rankin (Stroke Patients Only)       Balance Overall balance assessment: Needs assistance Sitting-balance support: No upper extremity supported, Feet supported   Sitting balance - Comments: supervision static sitting EOB   Standing balance support: During functional activity, Single extremity supported Standing balance-Leahy Scale: Poor Standing balance comment: requires 1UE support &  min assist to perform standing marching in place                             Pertinent Vitals/Pain Pain Assessment Pain Assessment: Faces Faces Pain Scale: Hurts little  more Pain Location: ribs 2/2 coughing Pain Descriptors / Indicators: Sore Pain Intervention(s): Monitored during session    Home Living Family/patient expects to be discharged to:: Private residence Living Arrangements: Non-relatives/Friends Available Help at Discharge: Friend(s);Available PRN/intermittently Type of Home: Mobile home Home Access: Stairs to enter Entrance Stairs-Rails: Right;Left (wideset) Entrance Stairs-Number of Steps: 10 at front with wideset bilat rails; 2 at back door, but unable to access back door   Home Layout: One level Home Equipment: BSC/3in1;Rolling Walker (2 wheels) Additional Comments: Pt hx obtained from previous admission    Prior Function Prior Level of Function : Independent/Modified Independent             Mobility Comments: Ind amb without AD PRN for household ambulation; uses facility w/c for MD visits       Hand Dominance        Extremity/Trunk Assessment   Upper Extremity Assessment Upper Extremity Assessment: Generalized weakness    Lower Extremity Assessment Lower Extremity Assessment: Generalized weakness    Cervical / Trunk Assessment Cervical / Trunk Assessment:  (rounded shoulders)  Communication   Communication: No difficulties  Cognition Arousal/Alertness: Awake/alert Behavior During Therapy: Flat affect Overall Cognitive Status: No family/caregiver present to determine baseline cognitive functioning                                 General Comments: Pt oriented to self & situation, orientation questions not formally asked, pt with poor understanding of PT purpose with PT educating him, pt very concerned about mobilizing with IV, catheter with PT providing reassurance        General Comments General comments (skin integrity, edema, etc.): Pt on 3L/min via nasal cannula upon PT arrival with SPO2 >90%, dropped to 86% after transferring to recliner so pt increased to 4L/min & pt remained >/= 90%  throughout remainder of session on 4L/min. Of note, pt reports he's on 4L/min at home & MD & nurse notified.    Exercises General Exercises - Lower Extremity Hip Flexion/Marching: AROM, Strengthening, Both, 5 reps, Standing (Pt performs standing marches in place, 5 repetitions or less each BLE with 1UE & min assist for balance with activity limited by incontinent BM)   Assessment/Plan    PT Assessment Patient needs continued PT services  PT Problem List Decreased strength;Decreased mobility;Decreased activity tolerance;Decreased balance;Decreased knowledge of use of DME;Decreased safety awareness;Decreased knowledge of precautions;Cardiopulmonary status limiting activity       PT Treatment Interventions Gait training;Stair training;Balance training;Functional mobility training;Therapeutic exercise;Therapeutic activities;DME instruction;Patient/family education;Neuromuscular re-education    PT Goals (Current goals can be found in the Care Plan section)  Acute Rehab PT Goals Patient Stated Goal: get better PT Goal Formulation: With patient Time For Goal Achievement: 07/15/21 Potential to Achieve Goals: Fair    Frequency       Co-evaluation               AM-PAC PT "6 Clicks" Mobility  Outcome Measure Help needed turning from your back to your side while in a flat bed without using bedrails?: A Little Help needed moving from lying on your back to sitting on the side of a flat  bed without using bedrails?: A Little Help needed moving to and from a bed to a chair (including a wheelchair)?: A Little Help needed standing up from a chair using your arms (e.g., wheelchair or bedside chair)?: A Little Help needed to walk in hospital room?: A Little Help needed climbing 3-5 steps with a railing? : A Lot 6 Click Score: 17    End of Session Equipment Utilized During Treatment: Oxygen Activity Tolerance:  (pt limited by need to toilet) Patient left:  (on BSC in care of NT) Nurse  Communication: Mobility status PT Visit Diagnosis: Unsteadiness on feet (R26.81);Muscle weakness (generalized) (M62.81);Difficulty in walking, not elsewhere classified (R26.2)    Time: 0762-2633 PT Time Calculation (min) (ACUTE ONLY): 21 min   Charges:   PT Evaluation $PT Eval Low Complexity: 1 Low PT Treatments $Therapeutic Activity: 8-22 mins        Lavone Nian, PT, DPT 07/01/21, 11:35 AM   Waunita Schooner 07/01/2021, 11:34 AM

## 2021-07-02 DIAGNOSIS — J441 Chronic obstructive pulmonary disease with (acute) exacerbation: Secondary | ICD-10-CM

## 2021-07-02 DIAGNOSIS — E871 Hypo-osmolality and hyponatremia: Secondary | ICD-10-CM | POA: Diagnosis not present

## 2021-07-02 DIAGNOSIS — I5032 Chronic diastolic (congestive) heart failure: Secondary | ICD-10-CM

## 2021-07-02 DIAGNOSIS — J9611 Chronic respiratory failure with hypoxia: Secondary | ICD-10-CM

## 2021-07-02 LAB — BASIC METABOLIC PANEL
Anion gap: 10 (ref 5–15)
BUN: 18 mg/dL (ref 8–23)
CO2: 26 mmol/L (ref 22–32)
Calcium: 8.1 mg/dL — ABNORMAL LOW (ref 8.9–10.3)
Chloride: 93 mmol/L — ABNORMAL LOW (ref 98–111)
Creatinine, Ser: 0.45 mg/dL — ABNORMAL LOW (ref 0.61–1.24)
GFR, Estimated: >60 mL/min (ref >60)
Glucose, Bld: 153 mg/dL — ABNORMAL HIGH (ref 70–99)
Potassium: 4.1 mmol/L (ref 3.5–5.1)
Sodium: 129 mmol/L — ABNORMAL LOW (ref 135–145)

## 2021-07-02 LAB — MAGNESIUM: Magnesium: 1.6 mg/dL — ABNORMAL LOW (ref 1.7–2.4)

## 2021-07-02 MED ORDER — MAGNESIUM SULFATE 2 GM/50ML IV SOLN
2.0000 g | Freq: Once | INTRAVENOUS | Status: AC
  Administered 2021-07-02: 2 g via INTRAVENOUS
  Filled 2021-07-02: qty 50

## 2021-07-02 MED ORDER — QUETIAPINE FUMARATE 25 MG PO TABS
25.0000 mg | ORAL_TABLET | Freq: Every day | ORAL | Status: DC
  Administered 2021-07-03: 25 mg via ORAL
  Filled 2021-07-02 (×3): qty 1

## 2021-07-02 NOTE — Plan of Care (Signed)
°  Problem: Education: Goal: Knowledge of General Education information will improve Description: Including pain rating scale, medication(s)/side effects and non-pharmacologic comfort measures Outcome: Progressing   Problem: Health Behavior/Discharge Planning: Goal: Ability to manage health-related needs will improve Outcome: Progressing   Problem: Clinical Measurements: Goal: Will remain free from infection Outcome: Progressing   Problem: Clinical Measurements: Goal: Diagnostic test results will improve Outcome: Progressing   Problem: Clinical Measurements: Goal: Cardiovascular complication will be avoided Outcome: Progressing   Problem: Coping: Goal: Level of anxiety will decrease Outcome: Progressing   Problem: Elimination: Goal: Will not experience complications related to bowel motility Outcome: Progressing   Problem: Elimination: Goal: Will not experience complications related to urinary retention Outcome: Progressing   Problem: Pain Managment: Goal: General experience of comfort will improve Outcome: Progressing

## 2021-07-02 NOTE — Progress Notes (Signed)
°  Progress Note   Patient: Jeremiah [name] KCM:[id number] DOB: [birth year] DOA: 06/30/2021     2 DOS: the patient was seen and examined on 07/02/2021   Brief hospital course: Jeremiah [name] is a 71 y.o. male with COPD, chronic hypoxemia on 4 L oxygen, chronic diastolic congestive heart failure, essential hypertension, who came to the hospital with a short of breath and wheezing. Patient just left hospital on Friday after admission for hyponatremia.    Assessment and Plan: COPD exacerbation. Chronic hypoxemic respiratory failure. Patient condition seem to be improving, he is no longer wheezing today.  Still on 4 L oxygen which is baseline. Discussed with speech therapy, patient still has concern for silent aspiration. Probably will get a modified barium swallow on Monday if still in the hospital. Continue IV steroids and Zithromax.  Hypomagnesemia. Hyponatremia secondary to SIADH, chronic in nature. Replete mag.  Recheck level tomorrow. Continue salt tablets and fluid restriction.  Iron deficient anemia. Received IV iron, recheck CBC tomorrow.  Essential hypertension. Chronic diastolic congestive heart failure. Condition is stable.      Subjective:  Patient doing better today, short of breath much improved.  He could not sleep at nighttime, requested sleep medicine. No fever chills  No abdominal pain nausea vomiting.  Physical Exam: Vitals:   07/01/21 2231 07/02/21 0152 07/02/21 0507 07/02/21 0800  BP: 133/75  114/63 130/70  Pulse: 81  75 77  Resp: (!) 22  (!) 22 14  Temp: 98.5 F (36.9 C)  98.8 F (37.1 C) 98.6 F (37 C)  TempSrc: Oral   Oral  SpO2: 96% 93% 98% 97%  Weight:      Height:       General exam: Appears calm and comfortable  Respiratory system: Decreased breathing sound without wheezes or crackles. Respiratory effort normal. Cardiovascular system: S1 & S2 heard, RRR. No JVD, murmurs, rubs, gallops or clicks. No pedal edema. Gastrointestinal  system: Abdomen is nondistended, soft and nontender. No organomegaly or masses felt. Normal bowel sounds heard. Central nervous system: Alert and oriented. No focal neurological deficits. Extremities: Symmetric 5 x 5 power. Skin: No rashes, lesions or ulcers Psychiatry: Judgement and insight appear normal. Mood & affect appropriate.    Data Reviewed: Lab reviewed.  Family Communication:   Disposition: Status is: Inpatient Remains inpatient appropriate because: Severity of disease and IV treatment.          Planned Discharge Destination: Home     Time spent: 28 minutes  Author: Sharen Hones, MD 07/02/2021 10:44 AM  For on call review [email or web address].

## 2021-07-03 DIAGNOSIS — J441 Chronic obstructive pulmonary disease with (acute) exacerbation: Secondary | ICD-10-CM | POA: Diagnosis not present

## 2021-07-03 DIAGNOSIS — E871 Hypo-osmolality and hyponatremia: Secondary | ICD-10-CM | POA: Diagnosis not present

## 2021-07-03 DIAGNOSIS — J9611 Chronic respiratory failure with hypoxia: Secondary | ICD-10-CM | POA: Diagnosis not present

## 2021-07-03 LAB — HEPATIC FUNCTION PANEL
ALT: 22 U/L (ref 0–44)
AST: 17 U/L (ref 15–41)
Albumin: 2.5 g/dL — ABNORMAL LOW (ref 3.5–5.0)
Alkaline Phosphatase: 124 U/L (ref 38–126)
Bilirubin, Direct: <0.1 mg/dL (ref 0.0–0.2)
Total Bilirubin: 0.3 mg/dL (ref 0.3–1.2)
Total Protein: 5.9 g/dL — ABNORMAL LOW (ref 6.5–8.1)

## 2021-07-03 LAB — URINALYSIS, COMPLETE (UACMP) WITH MICROSCOPIC
Bilirubin Urine: NEGATIVE
Glucose, UA: NEGATIVE mg/dL
Hgb urine dipstick: NEGATIVE
Ketones, ur: NEGATIVE mg/dL
Leukocytes,Ua: NEGATIVE
Nitrite: NEGATIVE
Protein, ur: NEGATIVE mg/dL
Specific Gravity, Urine: 1.02 (ref 1.005–1.030)
pH: 5 (ref 5.0–8.0)

## 2021-07-03 LAB — PHOSPHORUS: Phosphorus: 3 mg/dL (ref 2.5–4.6)

## 2021-07-03 LAB — CBC WITH DIFFERENTIAL/PLATELET
Abs Immature Granulocytes: 0.05 10*3/uL (ref 0.00–0.07)
Basophils Absolute: 0 10*3/uL (ref 0.0–0.1)
Basophils Relative: 0 %
Eosinophils Absolute: 0 10*3/uL (ref 0.0–0.5)
Eosinophils Relative: 0 %
HCT: 27.9 % — ABNORMAL LOW (ref 39.0–52.0)
Hemoglobin: 9.5 g/dL — ABNORMAL LOW (ref 13.0–17.0)
Immature Granulocytes: 1 %
Lymphocytes Relative: 7 %
Lymphs Abs: 0.7 10*3/uL (ref 0.7–4.0)
MCH: 28.1 pg (ref 26.0–34.0)
MCHC: 34.1 g/dL (ref 30.0–36.0)
MCV: 82.5 fL (ref 80.0–100.0)
Monocytes Absolute: 0.7 10*3/uL (ref 0.1–1.0)
Monocytes Relative: 7 %
Neutro Abs: 8.6 10*3/uL — ABNORMAL HIGH (ref 1.7–7.7)
Neutrophils Relative %: 85 %
Platelets: 440 10*3/uL — ABNORMAL HIGH (ref 150–400)
RBC: 3.38 MIL/uL — ABNORMAL LOW (ref 4.22–5.81)
RDW: 16.8 % — ABNORMAL HIGH (ref 11.5–15.5)
WBC: 10.1 10*3/uL (ref 4.0–10.5)
nRBC: 0 % (ref 0.0–0.2)

## 2021-07-03 LAB — BASIC METABOLIC PANEL
Anion gap: 5 (ref 5–15)
BUN: 19 mg/dL (ref 8–23)
CO2: 28 mmol/L (ref 22–32)
Calcium: 8.2 mg/dL — ABNORMAL LOW (ref 8.9–10.3)
Chloride: 92 mmol/L — ABNORMAL LOW (ref 98–111)
Creatinine, Ser: 0.33 mg/dL — ABNORMAL LOW (ref 0.61–1.24)
GFR, Estimated: >60 mL/min (ref >60)
Glucose, Bld: 127 mg/dL — ABNORMAL HIGH (ref 70–99)
Potassium: 4.3 mmol/L (ref 3.5–5.1)
Sodium: 125 mmol/L — ABNORMAL LOW (ref 135–145)

## 2021-07-03 LAB — SODIUM
Sodium: 132 mmol/L — ABNORMAL LOW (ref 135–145)
Sodium: 133 mmol/L — ABNORMAL LOW (ref 135–145)

## 2021-07-03 LAB — MAGNESIUM: Magnesium: 1.8 mg/dL (ref 1.7–2.4)

## 2021-07-03 MED ORDER — TOLVAPTAN 15 MG PO TABS
15.0000 mg | ORAL_TABLET | Freq: Once | ORAL | Status: AC
  Administered 2021-07-03: 15 mg via ORAL
  Filled 2021-07-03: qty 1

## 2021-07-03 MED ORDER — POLYSACCHARIDE IRON COMPLEX 150 MG PO CAPS
150.0000 mg | ORAL_CAPSULE | Freq: Every day | ORAL | Status: DC
  Administered 2021-07-03 – 2021-07-05 (×3): 150 mg via ORAL
  Filled 2021-07-03 (×3): qty 1

## 2021-07-03 NOTE — TOC Progression Note (Signed)
Transition of Care [facility]) - Progression Note    Patient Details  Name: Jeremiah [name] MRN: [id number] Date of Birth: [birth year]  Transition of Care [facility]) CM/SW [place], [place] Phone Number: [phone] 07/03/2021, 8:26 AM  Clinical Narrative:     CSW updated Attending on patient's refusal for SNF placement. Requested home health orders for PT and RN.  [facility] has agreed to accept patient when he is ready for discharge.  Expected Discharge Plan: [place] Barriers to Discharge: Continued Medical Work up  Expected Discharge Plan and Services Expected Discharge Plan: [place] Choice: [place] arrangements for the past 2 months: Single Family Home                                       Social Determinants of Health (SDOH) Interventions    Readmission Risk Interventions Readmission Risk Prevention Plan 10/21/2020 09/27/2020 06/17/2020  Transportation Screening Complete Complete Complete  PCP or Specialist Appt within 3-5 Days Complete Complete Complete  HRI or Home Care Consult Complete Complete Complete  Social Work Consult for [place] Planning/Counseling Complete Not Complete Complete  SW consult not completed comments - RNCM assigned to patient -  Palliative Care Screening Not Applicable Not Applicable Not Applicable  Medication Review (RN Care Manager) Complete Complete Complete  Some recent data might be hidden

## 2021-07-03 NOTE — Progress Notes (Signed)
[place] for Sodium Monitoring   Recent Labs: Potassium (mmol/L)  Date Value  07/03/2021 4.3  10/02/2013 4.4   Magnesium (mg/dL)  Date Value  07/03/2021 1.8   Calcium (mg/dL)  Date Value  07/03/2021 8.2 (L)   Calcium, Total (mg/dL)  Date Value  10/02/2013 9.1   Albumin (g/dL)  Date Value  07/03/2021 2.5 (L)  10/01/2013 3.3 (L)   Phosphorus (mg/dL)  Date Value  07/03/2021 3.0   Sodium (mmol/L)  Date Value  07/03/2021 132 (L)  10/02/2013 136    Assessment: 71 y.o. male with known history of COPD with recent 2D echo showing diastolic dysfunction, hypertension recently admitted for severe hyponatremia. He received 15 mg tolvaptan at 0938 today.  Tolvaptan 15mg  x1 (2/26 @0938 ) Na: 125 > 132 (delta of 7 per 11hrs)  Plan:  Level correcting appropriately will CTM at next 8hr interval. Sodium level every 8 hours x 2 Notify MD if sodium increases more then 8 mEq/L in 8 hours OR greater than 12 mEq/L in 24 hours  Lorna Dibble ,PharmD Clinical Pharmacist 07/03/2021 6:20 PM

## 2021-07-03 NOTE — Progress Notes (Signed)
SLP Cancellation Note  Patient Details Name: Jeremiah [name] MRN: [id number] DOB: [birth year]   Cancelled treatment:       Reason Eval/Treat Not Completed: Per chart review, MD with concern for silent aspiration. Will proceed with MBSS next date. RN made aware.  Cherrie Gauze, M.S., [facility] [phone] [place])  Quintella Baton 07/03/2021, 12:11 PM

## 2021-07-03 NOTE — Plan of Care (Signed)
°  Problem: Education: Goal: Knowledge of General Education information will improve Description: Including pain rating scale, medication(s)/side effects and non-pharmacologic comfort measures Outcome: Progressing   Problem: Clinical Measurements: Goal: Ability to maintain clinical measurements within normal limits will improve Outcome: Progressing   Problem: Clinical Measurements: Goal: Diagnostic test results will improve Outcome: Progressing   Problem: Clinical Measurements: Goal: Respiratory complications will improve Outcome: Progressing   Problem: Nutrition: Goal: Adequate nutrition will be maintained Outcome: Progressing   Problem: Coping: Goal: Level of anxiety will decrease Outcome: Progressing   Problem: Safety: Goal: Ability to remain free from injury will improve Outcome: Progressing   Problem: Skin Integrity: Goal: Risk for impaired skin integrity will decrease Outcome: Progressing

## 2021-07-03 NOTE — Progress Notes (Signed)
°  Progress Note   Patient: Jeremiah [name] NLG:[id number] DOB: [birth year] DOA: 06/30/2021     3 DOS: the patient was seen and examined on 07/03/2021   Brief hospital course: Jeremiah [name] is a 71 y.o. male with COPD, chronic hypoxemia on 4 L oxygen, chronic diastolic congestive heart failure, essential hypertension, who came to the hospital with a short of breath and wheezing. Patient just left hospital on Friday after admission for hyponatremia.  Assessment and Plan: COPD exacerbation. Chronic hypoxemic respiratory failure. Patient feels much better today, short of breath improved.  Continue oxygen treatment. Scheduled for modified barium swallow tomorrow to rule out aspiration.  But patient currently does not have active pneumonia.   Hypomagnesemia. Hyponatremia secondary to SIADH, chronic in nature. Patient sodium level dropped down to 125, this is appears to be secondary to polydipsia.  Patient has fluid restriction order, looks like he is not compliant with it.  Discussed with nurse, need to enforce fluid restriction.  Due to sudden drop of sodium level, we will give a dose of tolvaptan.  Monitor sodium closely.  Iron deficient anemia. We will start oral iron, received IV iron yesterday.  Essential hypertension. Chronic diastolic congestive heart failure. Condition is stable.        Subjective:  Patient condition much better, short of breath improving.  No wheezing. No fever or chills. No abdominal pain nausea or vomiting.   Physical Exam: Vitals:   07/02/21 2057 07/03/21 0258 07/03/21 0404 07/03/21 0733  BP: 136/79  (!) 144/83 140/81  Pulse: 86  97 87  Resp: 20  20 16   Temp: 98.3 F (36.8 C)  98 F (36.7 C) 99 F (37.2 C)  TempSrc:      SpO2: 95% 94% 96% 98%  Weight:      Height:       General exam: Appears calm and comfortable  Respiratory system: Significantly decreased breath sounds without wheezes or crackles. Respiratory effort  normal. Cardiovascular system: S1 & S2 heard, RRR. No JVD, murmurs, rubs, gallops or clicks. No pedal edema. Gastrointestinal system: Abdomen is nondistended, soft and nontender. No organomegaly or masses felt. Normal bowel sounds heard. Central nervous system: Alert and oriented. No focal neurological deficits. Extremities: Symmetric 5 x 5 power. Skin: No rashes, lesions or ulcers Psychiatry: Judgement and insight appear normal. Mood & affect appropriate.    Data Reviewed: lab results.  Family Communication: No family listed.  Disposition: Status is: Inpatient Remains inpatient appropriate because: Severity of disease and IV treatment.          Planned Discharge Destination: Home with Home Health Patient initially requested nursing home placement, now refused it.      Time spent: 28 minutes  Author: Sharen Hones, MD 07/03/2021 11:27 AM  For on call review [email or web address].

## 2021-07-03 NOTE — Progress Notes (Signed)
[place] for Sodium Monitoring   Recent Labs: Potassium (mmol/L)  Date Value  07/03/2021 4.3  10/02/2013 4.4   Magnesium (mg/dL)  Date Value  07/03/2021 1.8   Calcium (mg/dL)  Date Value  07/03/2021 8.2 (L)   Calcium, Total (mg/dL)  Date Value  10/02/2013 9.1   Albumin (g/dL)  Date Value  07/03/2021 2.5 (L)  10/01/2013 3.3 (L)   Phosphorus (mg/dL)  Date Value  07/03/2021 3.0   Sodium (mmol/L)  Date Value  07/03/2021 125 (L)  10/02/2013 136    Assessment: 71 y.o. male with known history of COPD with recent 2D echo showing diastolic dysfunction, hypertension recently admitted for severe hyponatremia. He received 15 mg tolvaptan at 0938 today.  Plan:  Sodium level every 8 hours x 2 Notify MD if sodium increases more then 8 mEq/L in hours OR greater than 12 mEq/L in 24 hours  Dallie Piles ,PharmD Clinical Pharmacist 07/03/2021 1:27 PM

## 2021-07-04 ENCOUNTER — Inpatient Hospital Stay: Payer: Medicare HMO

## 2021-07-04 DIAGNOSIS — E871 Hypo-osmolality and hyponatremia: Secondary | ICD-10-CM | POA: Diagnosis not present

## 2021-07-04 DIAGNOSIS — J441 Chronic obstructive pulmonary disease with (acute) exacerbation: Secondary | ICD-10-CM | POA: Diagnosis not present

## 2021-07-04 DIAGNOSIS — J9611 Chronic respiratory failure with hypoxia: Secondary | ICD-10-CM | POA: Diagnosis not present

## 2021-07-04 LAB — BASIC METABOLIC PANEL
Anion gap: 9 (ref 5–15)
BUN: 18 mg/dL (ref 8–23)
CO2: 30 mmol/L (ref 22–32)
Calcium: 8.6 mg/dL — ABNORMAL LOW (ref 8.9–10.3)
Chloride: 98 mmol/L (ref 98–111)
Creatinine, Ser: 0.38 mg/dL — ABNORMAL LOW (ref 0.61–1.24)
GFR, Estimated: >60 mL/min (ref >60)
Glucose, Bld: 93 mg/dL (ref 70–99)
Potassium: 4.2 mmol/L (ref 3.5–5.1)
Sodium: 137 mmol/L (ref 135–145)

## 2021-07-04 LAB — GLUCOSE, CAPILLARY: Glucose-Capillary: 187 mg/dL — ABNORMAL HIGH (ref 70–99)

## 2021-07-04 LAB — SODIUM: Sodium: 133 mmol/L — ABNORMAL LOW (ref 135–145)

## 2021-07-04 LAB — MAGNESIUM: Magnesium: 1.9 mg/dL (ref 1.7–2.4)

## 2021-07-04 MED ORDER — SODIUM CHLORIDE 1 G PO TABS
2.0000 g | ORAL_TABLET | Freq: Two times a day (BID) | ORAL | Status: DC
  Administered 2021-07-04 – 2021-07-05 (×2): 2 g via ORAL
  Filled 2021-07-04 (×4): qty 2

## 2021-07-04 MED ORDER — DEXTROSE 50 % IV SOLN
1.0000 | Freq: Once | INTRAVENOUS | Status: AC
  Administered 2021-07-04: 50 mL via INTRAVENOUS
  Filled 2021-07-04: qty 50

## 2021-07-04 MED ORDER — PREDNISONE 20 MG PO TABS
40.0000 mg | ORAL_TABLET | Freq: Every day | ORAL | Status: DC
  Administered 2021-07-05: 40 mg via ORAL
  Filled 2021-07-04: qty 2

## 2021-07-04 NOTE — Plan of Care (Signed)
°  Problem: Education: Goal: Knowledge of General Education information will improve Description: Including pain rating scale, medication(s)/side effects and non-pharmacologic comfort measures Outcome: Progressing   Problem: Clinical Measurements: Goal: Ability to maintain clinical measurements within normal limits will improve Outcome: Progressing   Problem: Clinical Measurements: Goal: Will remain free from infection Outcome: Progressing   Problem: Clinical Measurements: Goal: Respiratory complications will improve Outcome: Progressing   Problem: Clinical Measurements: Goal: Cardiovascular complication will be avoided Outcome: Progressing   Problem: Elimination: Goal: Will not experience complications related to bowel motility Outcome: Progressing   Problem: Pain Managment: Goal: General experience of comfort will improve Outcome: Progressing   Problem: Safety: Goal: Ability to remain free from injury will improve Outcome: Progressing

## 2021-07-04 NOTE — Progress Notes (Signed)
Speech Language Pathology Treatment:    Patient Details Name: Jeremiah [name] MRN: [id number] DOB: May 03, 1951 Today's Date: 07/04/2021 Time: 4163-8453 SLP Time Calculation (min) (ACUTE ONLY): 15 min  Assessment / Plan / Recommendation Clinical Impression  MBSS completed this date. Pt seen for follow up education. Pt educated re: results of MBSS, diet recommendations, safe swallowing strategies/aspiration precautions with particular emphasis on rate/volume of intake and rest breaks PRN for energy conservation, relationship between breathing/swallowing, clinical risk factors for aspiration/aspiration PNA, and SLP POC. Pt verbalized understanding/agreement.   RN and MD made aware of results of MBSS, diet recommendations, safe swallowing strategies/aspiration precautions, and SLP POC.   Communication board in pt's room updated with diet recommendations and safe swallowing strategies/aspiration precautions.   Recommend continuation and a regular diet with thin liquids and safe swallowing strategies/aspiration precautions as outlined below. SLP to sign off as pt has no acute SLP needs.    HPI HPI: Jeremiah [name] is a 71 y.o. male with known history of COPD with recent 2D echo showing diastolic dysfunction, hypertension recently admitted for severe hyponatremia and also syncope was getting increasingly short of breath since discharge 5 days ago.  Patient states since his discharge he has been finding it difficult to ambulate without help.  And last few days got more short of breath with productive cough.  Denies chest pain fever or chills. DG Chest 06/30/21: COPD with new bibasilar opacities concerning for pneumonia. Small right pleural effusion. (Comparison to DG Chest 06/15/21: Emphysema and chronic bronchitic changes without acute airspace disease). Pt last seen for a bed side swallow assessment on 09/28/2020, with report indicating "grossly adequate oropharyngeal phase swallowing function w/ No  overt oropharyngeal phase dysphagia appreciated w/ trials accepted; no neuromuscular swallowing deficits appreciated." RN denying issue with intake during shift. MD progress note from this date reporting "I am concerned for chronic aspiration." Pt currently on a regular solids and thin liquids diet. No temperature currently and WBC trending down at 10.3. Pt sitting upright in bed on 3L O2 via nasal canula (baseline 4L O2 at home). Agreeable to bedside swallow assessment.      SLP Plan  All goals met      Recommendations for follow up therapy are one component of a multi-disciplinary discharge planning process, led by the attending physician.  Recommendations may be updated based on patient status, additional functional criteria and insurance authorization.    Recommendations  Diet recommendations: Regular;Thin liquid Medication Administration: Whole meds with liquid Supervision: Patient able to self feed Compensations: Slow rate;Small sips/bites (rest breaks PRN)                Oral Care Recommendations: Oral care BID Follow Up Recommendations: No SLP follow up Assistance recommended at discharge: Frequent or constant Supervision/Assistance SLP Visit Diagnosis: Dysphagia, unspecified (R13.10) Plan: All goals met          Cherrie Gauze, M.S., [facility] [phone] [place])   Quintella Baton  07/04/2021, 12:08 PM

## 2021-07-04 NOTE — Procedures (Addendum)
Objective Swallowing Evaluation: Type of Study: MBS-Modified Barium Swallow Study   Patient Details  Name: Jeremiah [name] MRN: [id number] Date of Birth: 05/01/1951  Today's Date: 07/04/2021 Time: SLP Start Time (ACUTE ONLY): 33 -SLP Stop Time (ACUTE ONLY): 20  SLP Time Calculation (min) (ACUTE ONLY): 30 min   Past Medical History:  Past Medical History:  Diagnosis Date   Acute and chronic respiratory failure (acute-on-chronic) ([place]) 10/18/2020   Asthma    COPD (chronic obstructive pulmonary disease) ([facility])    O2 dependent - 2L   GERD (gastroesophageal reflux disease)    HTN (hypertension)    Multifocal pneumonia 06/16/2020   Other emphysema ([place]) 08/22/2016   Oxygen dependent    Sepsis ([place]) 05/14/2020   Tobacco dependence    Past Surgical History:  Past Surgical History:  Procedure Laterality Date   CATARACT EXTRACTION W/PHACO Left 12/09/2019   Procedure: CATARACT EXTRACTION PHACO AND INTRAOCULAR LENS PLACEMENT (IOC) COMPLICATED LEFT 00.17 49:44.9;  Surgeon: Birder Robson, MD;  Location: [facility];  Service: Ophthalmology;  Laterality: Left;  MILOOP VISION BLUE   CATARACT EXTRACTION W/PHACO Right 01/27/2020   Procedure: CATARACT EXTRACTION PHACO AND INTRAOCULAR LENS PLACEMENT ([place]) RIGHT VISION BLUE 26.83 02:07.6;  Surgeon: Birder Robson, MD;  Location: [facility];  Service: Ophthalmology;  Laterality: Right;   HERNIA REPAIR     NECK SURGERY     HPI: Jeremiah [name] is a 71 y.o. male with known history of COPD with recent 2D echo showing diastolic dysfunction, hypertension recently admitted for severe hyponatremia and also syncope was getting increasingly short of breath since discharge 5 days ago.  Patient states since his discharge he has been finding it difficult to ambulate without help.  And last few days got more short of breath with productive cough.  Denies chest pain fever or chills. DG Chest 06/30/21: COPD with new bibasilar opacities  concerning for pneumonia. Small right pleural effusion. (Comparison to DG Chest 06/15/21: Emphysema and chronic bronchitic changes without acute airspace disease). Pt last seen for a bed side swallow assessment on 09/28/2020, with report indicating "grossly adequate oropharyngeal phase swallowing function w/ No overt oropharyngeal phase dysphagia appreciated w/ trials accepted; no neuromuscular swallowing deficits appreciated." RN denying issue with intake during shift. MD progress note from this date reporting "I am concerned for chronic aspiration." Pt currently on a regular solids and thin liquids diet. No temperature currently and WBC trending down at 10.3. Pt sitting upright in bed on 3L O2 via nasal canula (baseline 4L O2 at home). Agreeable to bedside swallow assessment.   Subjective: Pt alert. "I don't have trouble swallowing."    Recommendations for follow up therapy are one component of a multi-disciplinary discharge planning process, led by the attending physician.  Recommendations may be updated based on patient status, additional functional criteria and insurance authorization.  Assessment / Plan / Recommendation  Clinical Impressions 07/04/2021  Clinical Impression Pt seen for MBSS. Pt alert and cooperative. Denies difficulty swallowing. Initially on 2L/min O2 via [place]. After clarification with RN by RT, increaed to 4L/min O2 via [place]. Pt SOB with repositioning/transfer to MBS chair.   Pt demonstrated a grossly functional oral and a functional pharyngeal swallow. 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 Adequate oral phase across additional textures. Adequate oral clearance across all textures. Pharyngeal swallow was [facility] with swallow initiation at the vallecula or pyriform sinuses. No instances of penetration or aspiration observed during controlled confines of study.  No appreciable residuals. Screening of the cervical esophagus was  unremarkable. Of note, pt with congested, non-productive cough x2 during study which did not appear related to pharyngeal swallow function.   Recommend continuation of regular diet with thin liquids with safe swallowing strategies/aspiration precautions as outlined below including rest breaks PRN for energy consevation while swallowing.   SLP to f/u x1 for education following MBSS.  SLP Visit Diagnosis Dysphagia, unspecified (R13.10)  Attention and concentration deficit following --  Frontal lobe and executive function deficit following --  Impact on safety and function Mild aspiration risk      Treatment Recommendations 07/04/2021  Treatment Recommendations Therapy as outlined in treatment plan below     Prognosis 07/04/2021  Prognosis for Safe Diet Advancement Good  Barriers to Reach Goals --  Barriers/Prognosis Comment --    Diet Recommendations 07/04/2021  SLP Diet Recommendations Regular solids;Thin liquid  Liquid Administration via --  Medication Administration Whole meds with liquid  Compensations Slow rate;Small sips/bites  Postural Changes Remain semi-upright after after feeds/meals (Comment);Seated upright at 90 degrees      Other Recommendations 07/04/2021  Recommended Consults --  Oral Care Recommendations Oral care BID  Other Recommendations --  Follow Up Recommendations No SLP follow up  Assistance recommended at discharge Frequent or constant Supervision/Assistance  Functional Status Assessment --    Frequency and Duration  07/01/2021  Speech Therapy Frequency (ACUTE ONLY) min 1 x/week  Treatment Duration 2 weeks      Oral Phase 07/04/2021  Oral Phase Impaired  Oral - Pudding Teaspoon --  Oral - Pudding Cup --  Oral - Honey Teaspoon --  Oral - Honey Cup --  Oral - Nectar Teaspoon --  Oral - Nectar Cup --  Oral - Nectar Straw --  Oral - Thin Teaspoon WFL  Oral - Thin Cup WFL  Oral - Thin Straw NT  Oral - Puree WFL  Oral - Mech Soft NT  Oral - Regular  Impaired mastication;Piecemeal swallowing  Oral - Multi-Consistency NT  Oral - Pill NT  Oral Phase - Comment --    Pharyngeal Phase 07/04/2021  Pharyngeal Phase WFL;Impaired  Pharyngeal- Pudding Teaspoon --  Pharyngeal --  Pharyngeal- Pudding Cup --  Pharyngeal --  Pharyngeal- Honey Teaspoon --  Pharyngeal --  Pharyngeal- Honey Cup --  Pharyngeal --  Pharyngeal- Nectar Teaspoon --  Pharyngeal --  Pharyngeal- Nectar Cup --  Pharyngeal --  Pharyngeal- Nectar Straw --  Pharyngeal --  Pharyngeal- Thin Teaspoon Delayed swallow initiation-vallecula;Delayed swallow initiation-pyriform sinuses  Pharyngeal Material does not enter airway  Pharyngeal- Thin Cup Delayed swallow initiation-pyriform sinuses  Pharyngeal Material does not enter airway  Pharyngeal- Thin Straw NT  Pharyngeal --  Pharyngeal- Puree Delayed swallow initiation-vallecula  Pharyngeal Material does not enter airway  Pharyngeal- Mechanical Soft NT  Pharyngeal --  Pharyngeal- Regular Delayed swallow initiation-pyriform sinuses  Pharyngeal Material does not enter airway  Pharyngeal- Multi-consistency NT  Pharyngeal --  Pharyngeal- Pill NT  Pharyngeal --  Pharyngeal Comment --     Cervical Esophageal Phase  07/04/2021  Cervical Esophageal Phase WFL  Pudding Teaspoon --  Pudding Cup --  Honey Teaspoon --  Honey Cup --  Nectar Teaspoon --  Nectar Cup --  Nectar Straw --  Thin Teaspoon --  Thin Cup --  Thin Straw --  Puree --  Mechanical Soft --  Regular --  Multi-consistency --  Pill --  Cervical Esophageal Comment --    Cherrie Gauze, M.S., CCC-SLP Speech-Language Pathologist  [facility] [phone] ([place])  Quintella Baton 07/04/2021, 12:04 PM

## 2021-07-04 NOTE — Progress Notes (Signed)
[place] for Sodium Monitoring   Recent Labs: Potassium (mmol/L)  Date Value  07/03/2021 4.3  10/02/2013 4.4   Magnesium (mg/dL)  Date Value  07/03/2021 1.8   Calcium (mg/dL)  Date Value  07/03/2021 8.2 (L)   Calcium, Total (mg/dL)  Date Value  10/02/2013 9.1   Albumin (g/dL)  Date Value  07/03/2021 2.5 (L)  10/01/2013 3.3 (L)   Phosphorus (mg/dL)  Date Value  07/03/2021 3.0   Sodium (mmol/L)  Date Value  07/03/2021 133 (L)  10/02/2013 136    Assessment: 71 y.o. male with known history of COPD with recent 2D echo showing diastolic dysfunction, hypertension recently admitted for severe hyponatremia. He received 15 mg tolvaptan at 0938 today.  Tolvaptan 15mg  x1 (2/26 @0938 ) Na: 125 > 133 (delta of 8 in~20 hrs)  Plan:  Level correcting appropriately will CTM with AM labs Notify MD if sodium increases more then 8 mEq/L in 8 hours OR greater than 12 mEq/L in 24 hours  Pearla Dubonnet ,PharmD Clinical Pharmacist 07/04/2021 12:34 AM

## 2021-07-04 NOTE — Progress Notes (Signed)
[place] for Sodium Monitoring   Recent Labs: Potassium (mmol/L)  Date Value  07/04/2021 4.2  10/02/2013 4.4   Magnesium (mg/dL)  Date Value  07/04/2021 1.9   Calcium (mg/dL)  Date Value  07/04/2021 8.6 (L)   Calcium, Total (mg/dL)  Date Value  10/02/2013 9.1   Albumin (g/dL)  Date Value  07/03/2021 2.5 (L)  10/01/2013 3.3 (L)   Phosphorus (mg/dL)  Date Value  07/03/2021 3.0   Sodium (mmol/L)  Date Value  07/04/2021 137  10/02/2013 136    Assessment: 71 y.o. male with known history of COPD with recent 2D echo showing diastolic dysfunction, hypertension recently admitted for severe hyponatremia. He received 15 mg tolvaptan at 0938 today.  Tolvaptan 15mg  x1 (2/26 @0938 ) Na: 125 > 137 (delta of 12 in~24 hrs)  Plan:  Notified overnight NP regarding 19mEq increase and recommended to discontinue NaCl PO 2g BID order. Suggestion was approved and order was discontinued. Will allow daytime attending to further adjust orders. Continue to notify MD if sodium increases more then 8 mEq/L in 8 hours OR greater than 12 mEq/L in 24 hours  Pearla Dubonnet ,PharmD Clinical Pharmacist 07/04/2021 6:05 AM

## 2021-07-04 NOTE — Progress Notes (Signed)
°  Progress Note   Patient: Jeremiah [name] IWP:[id number] DOB: 07-Sep-1950 DOA: 06/30/2021     4 DOS: the patient was seen and examined on 07/04/2021   Brief hospital course: Rilley Stash is a 71 y.o. male with COPD, chronic hypoxemia on 4 L oxygen, chronic diastolic congestive heart failure, essential hypertension, who came to the hospital with a short of breath and wheezing. Patient just left hospital on Friday after admission for hyponatremia.   Assessment and Plan:  COPD exacerbation. Chronic hypoxemic respiratory failure. Patient short of breath is improved, currently does not have any bronchospasm.  I will change steroids to oral.   Initially concerning for aspiration, however, modified barium swallow did not show any evidence or high risk for aspiration.   Hypomagnesemia. Hyponatremia secondary to SIADH, chronic in nature. But sodium dropped down to 125 yesterday, received a dose of tolvaptan.  Sodium level initially increased to 137, back down to 133.  Will resume further restriction and sodium tablets..  Iron deficient anemia. Status post IV iron, continue oral supplement. Essential hypertension.  Chronic diastolic congestive heart failure. Condition is stable.  Insomnia. Patient received a dose of Seroquel, slept better last night.  But feels weaker today, prefer to stay another day in the hospital.  Will discharge tomorrow.    Subjective:  Patient slept really well last night, feels tired this morning.  Prefer to go home tomorrow as he lives by himself.  He has refused nursing placement. Short of breath has improved, no wheezing. No abdominal pain or nausea vomiting.  Physical Exam: Vitals:   07/03/21 2226 07/04/21 0215 07/04/21 0435 07/04/21 0748  BP: 126/74  136/68 126/71  Pulse: 98  80 81  Resp: 18  20 18   Temp:   98.7 F (37.1 C) 98 F (36.7 C)  TempSrc:   Oral   SpO2: 95% 93% 96% 95%  Weight:      Height:       General exam: Appears calm and  comfortable  Respiratory system: Decreased breathing sounds. Respiratory effort normal. Cardiovascular system: S1 & S2 heard, RRR. No JVD, murmurs, rubs, gallops or clicks. No pedal edema. Gastrointestinal system: Abdomen is nondistended, soft and nontender. No organomegaly or masses felt. Normal bowel sounds heard. Central nervous system: Alert and oriented. No focal neurological deficits. Extremities: Symmetric 5 x 5 power. Skin: No rashes, lesions or ulcers Psychiatry: Judgement and insight appear normal. Mood & affect appropriate.    Data Reviewed: Lab reviewed,  Family Communication:   Disposition: Status is: Inpatient Remains inpatient appropriate because: Unsafe discharge.          Planned Discharge Destination: Home with Home Health     Time spent: 23 minutes  Author: Sharen Hones, MD 07/04/2021 12:07 PM  For on call review [email or web address].

## 2021-07-05 ENCOUNTER — Other Ambulatory Visit (HOSPITAL_COMMUNITY): Payer: Self-pay

## 2021-07-05 DIAGNOSIS — E871 Hypo-osmolality and hyponatremia: Secondary | ICD-10-CM | POA: Diagnosis not present

## 2021-07-05 DIAGNOSIS — J441 Chronic obstructive pulmonary disease with (acute) exacerbation: Secondary | ICD-10-CM | POA: Diagnosis not present

## 2021-07-05 DIAGNOSIS — I5032 Chronic diastolic (congestive) heart failure: Secondary | ICD-10-CM | POA: Diagnosis not present

## 2021-07-05 DIAGNOSIS — J9611 Chronic respiratory failure with hypoxia: Secondary | ICD-10-CM | POA: Diagnosis not present

## 2021-07-05 LAB — BASIC METABOLIC PANEL
Anion gap: 6 (ref 5–15)
BUN: 22 mg/dL (ref 8–23)
CO2: 32 mmol/L (ref 22–32)
Calcium: 8.5 mg/dL — ABNORMAL LOW (ref 8.9–10.3)
Chloride: 96 mmol/L — ABNORMAL LOW (ref 98–111)
Creatinine, Ser: 0.49 mg/dL — ABNORMAL LOW (ref 0.61–1.24)
GFR, Estimated: >60 mL/min (ref >60)
Glucose, Bld: 108 mg/dL — ABNORMAL HIGH (ref 70–99)
Potassium: 4.1 mmol/L (ref 3.5–5.1)
Sodium: 134 mmol/L — ABNORMAL LOW (ref 135–145)

## 2021-07-05 MED ORDER — NICOTINE 21 MG/24HR TD PT24: 21.0000 mg | MEDICATED_PATCH | Freq: Every day | TRANSDERMAL | 0 refills | Status: AC

## 2021-07-05 MED ORDER — PREDNISONE 20 MG PO TABS: ORAL_TABLET | ORAL | 0 refills | Status: AC

## 2021-07-05 MED ORDER — FERROUS SULFATE 325 (65 FE) MG PO TABS
325.0000 mg | ORAL_TABLET | Freq: Every day | ORAL | 0 refills | Status: AC
Start: 2021-07-05 — End: 2021-09-12

## 2021-07-05 MED ORDER — NICOTINE 21 MG/24HR TD PT24
21.0000 mg | MEDICATED_PATCH | Freq: Every day | TRANSDERMAL | Status: DC
  Filled 2021-07-05: qty 14

## 2021-07-05 NOTE — TOC Progression Note (Signed)
Transition of Care [facility]) - Progression Note    Patient Details  Name: Jeremiah [name] MRN: [id number] Date of Birth: 07/23/1950  Transition of [facility]) CM/SW Contact  Eileen Stanford, LCSW Phone Number: 07/05/2021, 1:51 PM  Clinical Narrative:   CSW spoke with [name] and she confirmed pt can return home. EMS set up. RN aware. Merleen Nicely with [facility] notified.    Expected Discharge Plan: [place] Barriers to Discharge: Continued Medical Work up  Expected Discharge Plan and Services Expected Discharge Plan: [place] Choice: [place] arrangements for the past 2 months: Single Family Home Expected Discharge Date: 07/05/21                                     Social Determinants of Health (SDOH) Interventions    Readmission Risk Interventions Readmission Risk Prevention Plan 10/21/2020 09/27/2020 06/17/2020  Transportation Screening Complete Complete Complete  PCP or Specialist Appt within 3-5 Days Complete Complete Complete  HRI or Home Care Consult Complete Complete Complete  Social Work Consult for [place] Planning/Counseling Complete Not Complete Complete  SW consult not completed comments - RNCM assigned to patient -  Palliative Care Screening Not Applicable Not Applicable Not Applicable  Medication Review Press photographer) Complete Complete Complete  Some recent data might be hidden

## 2021-07-05 NOTE — Care Management Important Message (Signed)
Important Message  Patient Details  Name: Jeremiah [name] MRN: [id number] Date of Birth: Feb 13, 1951   Medicare Important Message Given:  Yes     Jeremiah [name] 07/05/2021, 10:47 AM

## 2021-07-05 NOTE — TOC Benefit Eligibility Note (Signed)
Patient Teacher, English as a foreign language completed.    The patient is currently admitted and upon discharge could be taking nicotine 21 mg/24 hr patch.  Product/Service Not Covered under Part D  The patient is insured through [place], [place] Patient Advocate Specialist [place] Patient Advocate Team Direct Number: [phone]  Fax: [phone]

## 2021-07-05 NOTE — Discharge Summary (Signed)
Physician Discharge Summary   Patient: Jeremiah Jeremiah [name] MRN: [id number] DOB: [birth year]  Admit date:     06/30/2021  Discharge date: 07/05/21  Discharge Physician: Sharen Hones   PCP: [place]   Recommendations at discharge:   Follow-up with PCP and pulmonology as scheduled. Fluid restriction less than 1200 mL/day, no restriction for salt.   Discharge Diagnoses: Active Problems:   Hyponatremia   COPD with acute exacerbation (HCC)   Chronic diastolic CHF (congestive heart failure) (HCC)   Chronic hypoxemic respiratory failure (HCC)  Resolved Problems:   * No resolved [name] problems. *   [name] Course: Jeremiah Jeremiah [name] is a 71 y.o. male with COPD, chronic hypoxemia on 4 L oxygen, chronic diastolic congestive heart failure, essential hypertension, who came to the [name] with a short of breath and wheezing. Patient just left [name] on Friday after admission for hyponatremia.  Assessment and Plan: COPD exacerbation. Chronic hypoxemic respiratory failure. Tobacco abuse Patient short of breath is improved, currently does not have any bronchospasm.  Initially concerning for aspiration, however, modified barium swallow did not show any evidence or high risk for aspiration. Continue steroid taper. Patient continue to smoke with a severe COPD and a chronic hypoxemic respite failure.  He is strongly urged to quit his smoking, I also asked social worker to help with nicotine patch.  However, if he started smoking again, we cannot avoid readmission, long long-term prognosis is poor. I will also refer patient to pulmonology after discharge   Hypomagnesemia. Hyponatremia secondary to SIADH, chronic in nature. But sodium dropped down to 125 , received a dose of tolvaptan.    Will resume further restriction and sodium tablets..  Iron deficient anemia. Status post IV iron, continue oral supplement. Essential hypertension.  Chronic diastolic congestive  heart failure. Condition is stable.   Insomnia. Improved.  Pressure ulcers POA. Follow with RN   Pressure Injury 06/15/21 Coccyx Medial Stage 2 -  Partial thickness loss of dermis presenting as a shallow open injury with a red, pink wound bed without slough. pink, first layer gone (Active)  06/15/21 0800  Location: Coccyx  Location Orientation: Medial  Staging: Stage 2 -  Partial thickness loss of dermis presenting as a shallow open injury with a red, pink wound bed without slough.  Wound Description (Comments): pink, first layer gone  Present on Admission: Yes     Pressure Injury 06/16/21 Buttocks Right Stage 2 -  Partial thickness loss of dermis presenting as a shallow open injury with a red, pink wound bed without slough. (Active)  06/16/21 2322  Location: Buttocks  Location Orientation: Right  Staging: Stage 2 -  Partial thickness loss of dermis presenting as a shallow open injury with a red, pink wound bed without slough.  Wound Description (Comments):   Present on Admission: Yes              Consultants: None Procedures performed: None  Disposition: Home health Diet recommendation:  Discharge Diet Orders (From admission, onward)     Start     Ordered   07/05/21 0000  Diet - low sodium heart healthy        07/05/21 1033           Regular diet  DISCHARGE MEDICATION: Allergies as of 07/05/2021       Reactions   Penicillins Anaphylaxis, Swelling, Other (See Comments)   Tolerated Ceftriaxone 06/2020 05/2020 childhood reaction and his mother told him that he "swelled up." Had a PCN reaction  causing immediate rash, facial/tongue/throat swelling, SOB or lightheadedness with hypotension: Yes Had a PCN reaction causing severe rash involving mucus membranes or skin necrosis: No Had a PCN reaction that required hospitalization No Had a PCN reaction occurring within the last 10 years: No If all the above answers are "NO", may proceed with Cephalosporin use.         Medication List     TAKE these medications    acetaminophen 325 MG tablet Commonly known as: TYLENOL Take 2 tablets (650 mg total) by mouth every 6 (six) hours as needed for mild pain (or Fever >/= 101).   albuterol 108 (90 Base) MCG/ACT inhaler Commonly known as: VENTOLIN HFA Inhale into the lungs every 6 (six) hours as needed for wheezing or shortness of breath.   atorvastatin 20 MG tablet Commonly known as: LIPITOR Take 20 mg by mouth daily.   Breo Ellipta 100-25 MCG/ACT Aepb Generic drug: fluticasone furoate-vilanterol Inhale 1 puff into the lungs daily.   diltiazem 120 MG tablet Commonly known as: Cardizem Take 1 tablet (120 mg total) by mouth 2 (two) times daily.   DULoxetine 30 MG capsule Commonly known as: CYMBALTA Take 30 mg by mouth daily.   ferrous sulfate 325 (65 FE) MG tablet Take 1 tablet (325 mg total) by mouth daily.   furosemide 20 MG tablet Commonly known as: Lasix Take 0.5 tablets (10 mg total) by mouth daily as needed for edema.   guaiFENesin-dextromethorphan 100-10 MG/5ML syrup Commonly known as: ROBITUSSIN DM Take 5 mLs by mouth every 6 (six) hours as needed for cough.   ipratropium-albuterol 0.5-2.5 (3) MG/3ML Soln Commonly known as: DUONEB Take 3 mLs by nebulization every 6 (six) hours as needed.   montelukast 10 MG tablet Commonly known as: SINGULAIR Take 1 tablet (10 mg total) by mouth at bedtime.   mupirocin ointment 2 % Commonly known as: BACTROBAN Apply 1 application topically 2 (two) times daily.   nicotine 21 mg/24hr patch Commonly known as: NICODERM CQ - dosed in mg/24 hours Place 1 patch (21 mg total) onto the skin daily.   nicotine polacrilex 2 MG gum Commonly known as: NICORETTE Take 1 each (2 mg total) by mouth as needed for smoking cessation.   pantoprazole 20 MG tablet Commonly known as: PROTONIX Take 1 tablet (20 mg total) by mouth daily.   predniSONE 20 MG tablet Commonly known as: DELTASONE Take 2  tablets (40 mg total) by mouth daily with breakfast for 2 days, THEN 1 tablet (20 mg total) daily with breakfast for 3 days, THEN 0.5 tablets (10 mg total) daily with breakfast for 3 days. Start taking on: July 05, 2021 What changed: See the new instructions.   sodium chloride 1 g tablet Take 2 tablets (2 g total) by mouth 2 (two) times daily with a meal.   tiotropium 18 MCG inhalation capsule Commonly known as: SPIRIVA Place 18 mcg into inhaler and inhale daily.               Discharge Care Instructions  (From admission, onward)           Start     Ordered   07/05/21 0000  Discharge wound care:       Comments: Follow with RN   07/05/21 [place] Follow up in 1 week(s).   Contact information: [place] [address] [phone]  Ottie Glazier, MD Follow up in 1 month(s).   Specialty: Pulmonary Disease Contact information: [place] [address] [phone]                 Discharge Exam: [name] Weights   06/30/21 1427 06/30/21 1947  Weight: 68.9 kg 65.4 kg   General exam: Appears calm and comfortable  Respiratory system: Significant decreased breathing sounds. Respiratory effort normal. Cardiovascular system: S1 & S2 heard, RRR. No JVD, murmurs, rubs, gallops or clicks. No pedal edema. Gastrointestinal system: Abdomen is nondistended, soft and nontender. No organomegaly or masses felt. Normal bowel sounds heard. Central nervous system: Alert and oriented. No focal neurological deficits. Extremities: Symmetric 5 x 5 power. Skin: No rashes, lesions or ulcers Psychiatry: Judgement and insight appear normal. Mood & affect appropriate.    Condition at discharge: fair  The results of significant diagnostics from this hospitalization (including imaging, microbiology, ancillary and laboratory) are listed below for reference.   Imaging  Studies: DG Chest 2 View  Result Date: 06/15/2021 CLINICAL DATA:  Shortness of breath EXAM: CHEST - 2 VIEW COMPARISON:  06/04/2021, CT chest 05/28/2021, chest x-ray 05/14/2020 FINDINGS: Advanced emphysema and diffuse bronchitic changes. No focal opacity, pleural effusion, or pneumothorax. Stable cardiomediastinal silhouette. Aortic atherosclerosis. IMPRESSION: Emphysema and chronic bronchitic changes without acute airspace disease Electronically Signed   By: Donavan Foil M.D.   On: 06/15/2021 17:06   CT HEAD WO CONTRAST (5MM)  Result Date: 06/15/2021 CLINICAL DATA:  Mental status change, unknown cause. Three episodes of loss of consciousness today. EXAM: CT HEAD WITHOUT CONTRAST TECHNIQUE: Contiguous axial images were obtained from the base of the skull through the vertex without intravenous contrast. RADIATION DOSE REDUCTION: This exam was performed according to the departmental dose-optimization program which includes automated exposure control, adjustment of the mA and/or kV according to patient size and/or use of iterative reconstruction technique. COMPARISON:  None. FINDINGS: Brain: There is no evidence of an acute infarct, intracranial hemorrhage, mass, midline shift, or extra-axial fluid collection. The ventricles and sulci are within normal limits for age. Patchy and confluent hypodensities in the cerebral white matter bilaterally are nonspecific but compatible with extensive chronic small vessel ischemic disease. Vascular: Calcified atherosclerosis at the skull base. No hyperdense vessel. Skull: No acute fracture or suspicious osseous lesion. Sinuses/Orbits: Mild right ethmoid air cell mucosal thickening. Trace bilateral mastoid fluid. Bilateral cataract extraction. Other: None. IMPRESSION: 1. No evidence of acute intracranial abnormality. 2. Extensive chronic small vessel ischemic disease. Electronically Signed   By: Logan Bores M.D.   On: 06/15/2021 16:53   DG Chest Port 1 View  Result Date:  06/30/2021 CLINICAL DATA:  Questionable sepsis.  Shortness of breath. EXAM: PORTABLE CHEST 1 VIEW COMPARISON:  06/15/2021 FINDINGS: The cardiomediastinal silhouette is grossly unchanged, although the cardiac silhouette is partially obscured. Aortic atherosclerosis is noted. There is underlying emphysema with chronic interstitial coarsening, however increased interstitial and patchy airspace opacities are present in the right greater than left lung bases compared to the prior study. There is a small right pleural effusion. No pneumothorax is identified. IMPRESSION: COPD with new bibasilar opacities concerning for pneumonia. Small right pleural effusion. Electronically Signed   By: Logan Bores M.D.   On: 06/30/2021 14:50   DG Swallowing Func-Speech Pathology  Result Date: 07/04/2021 [name]     07/04/2021 12:06 PM Objective Swallowing Evaluation: Type of Study: MBS-Modified Barium Swallow Study  Patient Details Name: Jeremiah Jeremiah [name] MRN: [id number] Date of Birth:  12-May-1950 Today's Date: 07/04/2021 Time: SLP Start Time (ACUTE ONLY): 1100 -SLP Stop Time (ACUTE ONLY): 1130 SLP Time Calculation (min) (ACUTE ONLY): 30 min Past Medical History: Past Medical History: Diagnosis Date  Acute and chronic respiratory failure (acute-on-chronic) (HCC) 10/18/2020  Asthma   COPD (chronic obstructive pulmonary disease) (HCC)   O2 dependent - 2L  GERD (gastroesophageal reflux disease)   HTN (hypertension)   Multifocal pneumonia 06/16/2020  Other emphysema ([place]) 08/22/2016  Oxygen dependent   Sepsis ([place]) 05/14/2020  Tobacco dependence  Past Surgical History: Past Surgical History: Procedure Laterality Date  CATARACT EXTRACTION W/PHACO Left 12/09/2019  Procedure: CATARACT EXTRACTION PHACO AND INTRAOCULAR LENS PLACEMENT (IOC) COMPLICATED LEFT 16.10 96:04.5;  Surgeon: Birder Robson, MD;  Location: [facility];  Service: Ophthalmology;  Laterality: Left;  MILOOP VISION BLUE  CATARACT EXTRACTION W/PHACO Right  01/27/2020  Procedure: CATARACT EXTRACTION PHACO AND INTRAOCULAR LENS PLACEMENT ([place]) RIGHT VISION BLUE 26.83 02:07.6;  Surgeon: Birder Robson, MD;  Location: [facility];  Service: Ophthalmology;  Laterality: Right;  HERNIA REPAIR    NECK SURGERY   HPI: Jeremiah Jeremiah [name] is a 71 y.o. male with known history of COPD with recent 2D echo showing diastolic dysfunction, hypertension recently admitted for severe hyponatremia and also syncope was getting increasingly short of breath since discharge 5 days ago.  Patient states since his discharge he has been finding it difficult to ambulate without help.  And last few days got more short of breath with productive cough.  Denies chest pain fever or chills. DG Chest 06/30/21: COPD with new bibasilar opacities concerning for pneumonia. Small right pleural effusion. (Comparison to DG Chest 06/15/21: Emphysema and chronic bronchitic changes without acute airspace disease). Pt last seen for a bed side swallow assessment on 09/28/2020, with report indicating "grossly adequate oropharyngeal phase swallowing function w/ No overt oropharyngeal phase dysphagia appreciated w/ trials accepted; no neuromuscular swallowing deficits appreciated." RN denying issue with intake during shift. MD progress note from this date reporting "I am concerned for chronic aspiration." Pt currently on a regular solids and thin liquids diet. No temperature currently and WBC trending down at 10.3. Pt sitting upright in bed on 3L O2 via nasal canula (baseline 4L O2 at home). Agreeable to bedside swallow assessment.  Subjective: Pt alert. "I don't have trouble swallowing."  Recommendations for follow up therapy are one component of a multi-disciplinary discharge planning process, led by the attending physician.  Recommendations may be updated based on patient status, additional functional criteria and insurance authorization. Assessment / Plan / Recommendation Clinical Impressions 07/04/2021 Clinical  Impression Pt seen for MBSS. Pt alert and cooperative. Denies difficulty swallowing. Initially on 2L/min O2 via [place]. After clarification with RN by RT, increaed to 4L/min O2 via [place]. Pt SOB with repositioning/transfer to MBS chair. Pt demonstrated a grossly functional oral and a functional pharyngeal swallow. 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 Adequate oral phase across additional textures. Adequate oral clearance across all textures. Pharyngeal swallow was [name] with swallow initiation at the vallecula or pyriform sinuses. No instances of penetration or aspiration observed during controlled confines of study. No appreciable residuals. Screening of the cervical esophagus was unremarkable. Of note, pt with congested, non-productive cough x2 during study which did not appear related to pharyngeal swallow function. Recommend continuation of regular diet with thin liquids with safe swallowing strategies/aspiration precautions as outlined below including rest breaks PRN for energy consevation while swallowing. SLP to f/u x1 for education  following MBSS. SLP Visit Diagnosis Dysphagia, unspecified (R13.10) Attention and concentration deficit following -- Frontal lobe and executive function deficit following -- Impact on safety and function Mild aspiration risk   Treatment Recommendations 07/04/2021 Treatment Recommendations Therapy as outlined in treatment plan below   Prognosis 07/04/2021 Prognosis for Safe Diet Advancement Good Barriers to Reach Goals -- Barriers/Prognosis Comment -- Diet Recommendations 07/04/2021 SLP Diet Recommendations Regular solids;Thin liquid Liquid Administration via -- Medication Administration Whole meds with liquid Compensations Slow rate;Small sips/bites Postural Changes Remain semi-upright after after feeds/meals (Comment);Seated upright at 90 degrees   Other Recommendations 07/04/2021 Recommended Consults -- Oral Care Recommendations  Oral care BID Other Recommendations -- Follow Up Recommendations No SLP follow up Assistance recommended at discharge Frequent or constant Supervision/Assistance Functional Status Assessment -- Frequency and Duration  07/01/2021 Speech Therapy Frequency (ACUTE ONLY) min 1 x/week Treatment Duration 2 weeks   Oral Phase 07/04/2021 Oral Phase Impaired Oral - Pudding Teaspoon -- Oral - Pudding Cup -- Oral - Honey Teaspoon -- Oral - Honey Cup -- Oral - Nectar Teaspoon -- Oral - Nectar Cup -- Oral - Nectar Straw -- Oral - Thin Teaspoon WFL Oral - Thin Cup WFL Oral - Thin Straw NT Oral - Puree WFL Oral - Mech Soft NT Oral - Regular Impaired mastication;Piecemeal swallowing Oral - Multi-Consistency NT Oral - Pill NT Oral Phase - Comment --  Pharyngeal Phase 07/04/2021 Pharyngeal Phase WFL;Impaired Pharyngeal- Pudding Teaspoon -- Pharyngeal -- Pharyngeal- Pudding Cup -- Pharyngeal -- Pharyngeal- Honey Teaspoon -- Pharyngeal -- Pharyngeal- Honey Cup -- Pharyngeal -- Pharyngeal- Nectar Teaspoon -- Pharyngeal -- Pharyngeal- Nectar Cup -- Pharyngeal -- Pharyngeal- Nectar Straw -- Pharyngeal -- Pharyngeal- Thin Teaspoon Delayed swallow initiation-vallecula;Delayed swallow initiation-pyriform sinuses Pharyngeal Material does not enter airway Pharyngeal- Thin Cup Delayed swallow initiation-pyriform sinuses Pharyngeal Material does not enter airway Pharyngeal- Thin Straw NT Pharyngeal -- Pharyngeal- Puree Delayed swallow initiation-vallecula Pharyngeal Material does not enter airway Pharyngeal- Mechanical Soft NT Pharyngeal -- Pharyngeal- Regular Delayed swallow initiation-pyriform sinuses Pharyngeal Material does not enter airway Pharyngeal- Multi-consistency NT Pharyngeal -- Pharyngeal- Pill NT Pharyngeal -- Pharyngeal Comment --  Cervical Esophageal Phase  07/04/2021 Cervical Esophageal Phase WFL Pudding Teaspoon -- Pudding Cup -- Honey Teaspoon -- Honey Cup -- Nectar Teaspoon -- Nectar Cup -- Nectar Straw -- Thin Teaspoon -- Thin  Cup -- Thin Straw -- Puree -- Mechanical Soft -- Regular -- Multi-consistency -- Pill -- Cervical Esophageal Comment -- Jeremiah Jeremiah [name], M.S., [name] [phone] Jeremiah [facility] [name]) Jeremiah Jeremiah [name] 07/04/2021, 12:04 PM                     ECHOCARDIOGRAM COMPLETE  Result Date: 06/16/2021    ECHOCARDIOGRAM REPORT   Patient Name:   Jeremiah Jeremiah [name] Date of Exam: 06/15/2021 Medical Rec #:  [id number]           Height:       72.0 in Accession #:    [id number]          Weight:       155.6 lb Date of Birth:  [birth year]           BSA:          1.915 m Patient Age:    65 years            BP:           135/70 mmHg Patient Gender: M  HR:           64 bpm. Exam Location:  [facility] Procedure: 2D Echo, Cardiac Doppler and Color Doppler Indications:     R55 Syncope  History:         Patient has prior history of Echocardiogram examinations, most                  recent 06/11/2020. COPD; Risk Factors:Hypertension and Current                  Smoker. Asthma.  Sonographer:     Cresenciano Lick RDCS Referring Phys:  [id number] AMY N [facility] Diagnosing Phys: Ida Rogue MD IMPRESSIONS  1. Left ventricular ejection fraction, by estimation, is 55 to 60%. The left ventricle has normal function. The left ventricle has no regional wall motion abnormalities. Left ventricular diastolic parameters are consistent with Grade I diastolic dysfunction (impaired relaxation).  2. Right ventricular systolic function is normal. The right ventricular size is normal.  3. The mitral valve is normal in structure. No evidence of mitral valve regurgitation. No evidence of mitral stenosis.  4. The aortic valve is normal in structure. Aortic valve regurgitation is not visualized. No aortic stenosis is present.  5. The inferior vena cava is normal in size with greater than 50% respiratory variability, suggesting right atrial pressure of 3 mmHg. FINDINGS  Left Ventricle: Left  ventricular ejection fraction, by estimation, is 55 to 60%. The left ventricle has normal function. The left ventricle has no regional wall motion abnormalities. The left ventricular internal cavity size was normal in size. There is  no left ventricular hypertrophy. Left ventricular diastolic parameters are consistent with Grade I diastolic dysfunction (impaired relaxation). Right Ventricle: The right ventricular size is normal. No increase in right ventricular wall thickness. Right ventricular systolic function is normal. Left Atrium: Left atrial size was normal in size. Right Atrium: Right atrial size was normal in size. Pericardium: There is no evidence of pericardial effusion. Mitral Valve: The mitral valve is normal in structure. Mild mitral annular calcification. No evidence of mitral valve regurgitation. No evidence of mitral valve stenosis. Tricuspid Valve: The tricuspid valve is normal in structure. Tricuspid valve regurgitation is not demonstrated. No evidence of tricuspid stenosis. Aortic Valve: The aortic valve is normal in structure. Aortic valve regurgitation is not visualized. No aortic stenosis is present. Aortic valve mean gradient measures 4.0 mmHg. Aortic valve peak gradient measures 10.0 mmHg. Aortic valve area, by VTI measures 2.36 cm. Pulmonic Valve: The pulmonic valve was normal in structure. Pulmonic valve regurgitation is not visualized. No evidence of pulmonic stenosis. Aorta: The aortic root is normal in size and structure. Venous: The inferior vena cava is normal in size with greater than 50% respiratory variability, suggesting right atrial pressure of 3 mmHg. IAS/Shunts: No atrial level shunt detected by color flow Doppler.  LEFT VENTRICLE PLAX 2D LVIDd:         5.70 cm   Diastology LVIDs:         3.21 cm   LV e' medial:    10.00 cm/s LV PW:         0.92 cm   LV E/e' medial:  10.1 LV IVS:        0.72 cm   LV e' lateral:   9.14 cm/s LVOT diam:     2.00 cm   LV E/e' lateral: 11.1 LV SV:          67 LV SV Index:   35 LVOT  Area:     3.14 cm  RIGHT VENTRICLE RV Basal diam:  4.74 cm RV S prime:     20.90 cm/s TAPSE (M-mode): 2.6 cm LEFT ATRIUM             Index        RIGHT ATRIUM           Index LA diam:        4.80 cm 2.51 cm/m   RA Area:     21.40 cm LA Vol (A2C):   61.1 ml 31.90 ml/m  RA Volume:   70.80 ml  36.97 ml/m LA Vol (A4C):   61.2 ml 31.95 ml/m LA Biplane Vol: 63.1 ml 32.95 ml/m  AORTIC VALVE AV Area (Vmax):    1.99 cm AV Area (Vmean):   2.25 cm AV Area (VTI):     2.36 cm AV Vmax:           158.00 cm/s AV Vmean:          95.600 cm/s AV VTI:            0.282 m AV Peak Grad:      10.0 mmHg AV Mean Grad:      4.0 mmHg LVOT Vmax:         99.90 cm/s LVOT Vmean:        68.450 cm/s LVOT VTI:          0.212 m LVOT/AV VTI ratio: 0.75  AORTA Ao Root diam: 4.00 cm MITRAL VALVE MV Area (PHT): 2.95 cm     SHUNTS MV Decel Time: 257 msec     Systemic VTI:  0.21 m MV E velocity: 101.00 cm/s  Systemic Diam: 2.00 cm MV A velocity: 135.00 cm/s MV E/A ratio:  0.75 Ida Rogue MD Electronically signed by Ida Rogue MD Signature Date/Time: 06/16/2021/7:57:55 AM    Final     Microbiology: Results for orders placed or performed during the [name] encounter of 06/30/21  Blood Culture (routine x 2)     Status: None (Preliminary result)   Collection Time: 06/30/21  2:50 PM   Specimen: BLOOD  Result Value Ref Range Status   Specimen Description BLOOD RIGHT ANTECUBITAL  Final   Special Requests   Final    BOTTLES DRAWN AEROBIC AND ANAEROBIC Blood Culture adequate volume   Culture   Final    NO GROWTH 4 DAYS Performed at [name], [address], [place],  [address]    Report Status PENDING  Incomplete  Resp Panel by RT-PCR (Flu A&B, Covid) Nasopharyngeal Swab     Status: None   Collection Time: 06/30/21  2:52 PM   Specimen: Nasopharyngeal Swab; Nasopharyngeal(NP) swabs in vial transport medium  Result Value Ref Range Status   SARS Coronavirus 2 by RT PCR NEGATIVE  NEGATIVE Final    Comment: (NOTE) SARS-CoV-2 target nucleic acids are NOT DETECTED.  The SARS-CoV-2 RNA is generally detectable in upper respiratory specimens during the acute phase of infection. The lowest concentration of SARS-CoV-2 viral copies this assay can detect is 138 copies/mL. A negative result does not preclude SARS-Cov-2 infection and should not be used as the sole basis for treatment or other patient management decisions. A negative result may occur with  improper specimen collection/handling, submission of specimen other than nasopharyngeal swab, presence of viral mutation(s) within the areas targeted by this assay, and inadequate number of viral copies(<138 copies/mL). A negative result must be combined with clinical observations, patient history, and epidemiological information.  The expected result is Negative.  Fact Sheet for Patients:  EntrepreneurPulse.com.au  Fact Sheet for Healthcare Providers:  [employer]  This test is no t yet approved or cleared by the [place] FDA and  has been authorized for detection and/or diagnosis of SARS-CoV-2 by FDA under an Emergency Use Authorization (EUA). This EUA will remain  in effect (meaning this test can be used) for the duration of the COVID-19 declaration under Section 564(b)(1) of the Act, 21 U.S.C.section 360bbb-3(b)(1), unless the authorization is terminated  or revoked sooner.       Influenza A by PCR NEGATIVE NEGATIVE Final   Influenza B by PCR NEGATIVE NEGATIVE Final    Comment: (NOTE) The Xpert Xpress SARS-CoV-2/FLU/RSV plus assay is intended as an aid in the diagnosis of influenza from Nasopharyngeal swab specimens and should not be used as a sole basis for treatment. Nasal washings and aspirates are unacceptable for Xpert Xpress SARS-CoV-2/FLU/RSV testing.  Fact Sheet for Patients: EntrepreneurPulse.com.au  Fact Sheet for Healthcare  Providers: IncredibleEmployment[employer]be  This test is not yet approved or cleared by the [place] FDA and has been authorized for detection and/or diagnosis of SARS-CoV-2 by FDA under an Emergency Use Authorization (EUA). This EUA will remain in effect (meaning this test can be used) for the duration of the COVID-19 declaration under Section 564(b)(1) of the Act, 21 U.S.C. section 360bbb-3(b)(1), unless the authorization is terminated or revoked.  Performed at [name], [address] [place], [place] [address]   Blood Culture (routine x 2)     Status: None (Preliminary result)   Collection Time: 06/30/21  2:53 PM   Specimen: BLOOD  Result Value Ref Range Status   Specimen Description BLOOD LEFT ANTECUBITAL  Final   Special Requests   Final    BOTTLES DRAWN AEROBIC AND ANAEROBIC Blood Culture adequate volume   Culture   Final    NO GROWTH 4 DAYS Performed at [name], [address] [place], [place] [address]    Report Status PENDING  Incomplete    Labs: CBC: Recent Labs  Lab 06/30/21 1452 06/30/21 2032 07/01/21 0508 07/03/21 0428  WBC 17.0* 12.3* 10.3 10.1  NEUTROABS 16.6*  --   --  8.6*  HGB 10.3* 8.9* 9.2* 9.5*  HCT 31.7* 26.6* 27.7* 27.9*  MCV 85.2 82.9 82.7 82.5  PLT 426* 362 360 244*   Basic Metabolic Panel: Recent Labs  Lab 07/01/21 0508 07/02/21 0419 07/03/21 0428 07/03/21 1534 07/03/21 2330 07/04/21 0501 07/04/21 1008 07/05/21 0554  NA 134* 129* 125* 132* 133* 137 133* 134*  K 3.5 4.1 4.3  --   --  4.2  --  4.1  CL 97* 93* 92*  --   --  98  --  96*  CO2 28 26 28   --   --  30  --  32  GLUCOSE 101* 153* 127*  --   --  93  --  108*  BUN 19 18 19   --   --  18  --  22  CREATININE 0.40* 0.45* 0.33*  --   --  0.38*  --  0.49*  CALCIUM 8.2* 8.1* 8.2*  --   --  8.6*  --  8.5*  MG  --  1.6* 1.8  --   --  1.9  --   --   PHOS  --   --  3.0  --   --   --   --   --  Liver Function Tests: Recent Labs  Lab  06/30/21 1452 07/01/21 0508 07/03/21 0428  AST 19 14* 17  ALT 23 19 22   ALKPHOS 120 116 124  BILITOT 0.8 0.5 0.3  PROT 6.9 6.2* 5.9*  ALBUMIN 3.4* 2.8* 2.5*   CBG: Recent Labs  Lab 07/04/21 0645  GLUCAP 187*    Discharge time spent: greater than 30 minutes.  Signed: Sharen Hones, MD [facility] 07/05/2021

## 2021-07-05 NOTE — Progress Notes (Signed)
Physical Therapy Treatment Patient Details Name: [name] MRN: [id number] DOB: 05-Feb-1951 Today's Date: 07/05/2021   History of Present Illness Pt is a 71 y/o M admitted on 06/30/21 after presenting with c/o SOB. Chest x-ray showing bilateral infiltrates and pleural effusion. Pt is being treated Shortness of breath likely from mild fluid overload with chest x-ray showing pleural effusion and also possibility of bronchitis versus pneumonia. Pt with recent admission for severe hyponatremia & syncope & was getting increasingly SOB since d/c 5 days ago; pt also reports increasing difficulty with ambulating without assistance. PMH: COPD, HTN, acute & chronic respiratory failure, asthma, GERD, tobacco dependence    PT Comments    Patient tolerated session well and was agreeable to treatment. Patient in long sitting upon arrival in room. Patient reported only mild pain throughout session due to increased difficulty with breathing. Patient declined ambulation during session, however completed supine and EOB therapeutic exercises to increased BLE strength and endurance. Patient was able to complete all bed mobility at supervision, and completed multiple sit to stand transfers (~8) with progressive lowering of bed height. Patient completed at [facility] with RW and demonstrated good hand placement when completing transfer. Increased time was required between each rep due to increased fatigue. Patient was very inquisitive on how to improve his cardiovascular and muscular endurance at home, and briefly talked about smoking cessation. Patient would continue to benefit from skilled physical therapy in order to maximize independence with functional mobility and reduce fall risk prior to returning home.     Recommendations for follow up therapy are one component of a multi-disciplinary discharge planning process, led by the attending physician.  Recommendations may be updated based on patient status, additional  functional criteria and insurance authorization.  Follow Up Recommendations  Skilled nursing-short term rehab (<3 hours/day)     Assistance Recommended at Discharge Intermittent Supervision/Assistance  Patient can return home with the following A little help with bathing/dressing/bathroom;Assistance with cooking/housework;Direct supervision/assist for medications management;Assist for transportation;Help with stairs or ramp for entrance;A little help with walking and/or transfers   Equipment Recommendations  Rolling walker (2 wheels)    Recommendations for Other Services       Precautions / Restrictions Precautions Precautions: Fall Restrictions Weight Bearing Restrictions: No Other Position/Activity Restrictions: Watch SpO2 closely     Mobility  Bed Mobility Overal bed mobility: Needs Assistance Bed Mobility: Supine to Sit, Sit to Supine     Supine to sit: Supervision Sit to supine: Supervision   General bed mobility comments: use of bed rails    Transfers Overall transfer level: Needs assistance Equipment used: Rolling walker (2 wheels) Transfers: Sit to/from Stand Sit to Stand:  (SBA)           General transfer comment: Good control and stability    Ambulation/Gait Ambulation/Gait assistance:  (patient declined ambulation)                 Stairs             Wheelchair Mobility    Modified Rankin (Stroke Patients Only)       Balance   Sitting-balance support: No upper extremity supported, Feet supported Sitting balance-Leahy Scale: Good     Standing balance support: During functional activity, Single extremity supported Standing balance-Leahy Scale: Fair                              Cognition Arousal/Alertness: Awake/alert Behavior During Therapy: WFL for  tasks assessed/performed Overall Cognitive Status: No family/caregiver present to determine baseline cognitive functioning                                           Exercises General Exercises - Lower Extremity Heel Slides: Supine, 10 reps, AROM, Both Straight Leg Raises: Supine, AROM, 15 reps, Both Other Exercises Other Exercises: very inquisitive on how to better in cardiovascular and musuclar strength and endurance, chatted with patient about smoking cessation, and how to progressively increase activity within the home Other Exercises: patient educated on pursed lip breathing to increase ease with breathing    General Comments General comments (skin integrity, edema, etc.): Patient's O2 ranged from 89-94% on 4 L via [place], HR ranged from 93-111bpm throughout session      Pertinent Vitals/Pain Pain Assessment Pain Assessment: 0-10 Faces Pain Scale: Hurts a little bit Pain Location: ribs 2/2 coughing Pain Descriptors / Indicators: Sore Pain Intervention(s): Monitored during session    Home Living                          Prior Function            PT Goals (current goals can now be found in the care plan section) Acute Rehab PT Goals Patient Stated Goal: get better PT Goal Formulation: With patient Time For Goal Achievement: 07/15/21 Potential to Achieve Goals: Fair Progress towards PT goals: Progressing toward goals    Frequency    Min 2X/week      PT Plan Current plan remains appropriate    Co-evaluation              AM-PAC PT "6 Clicks" Mobility   Outcome Measure  Help needed turning from your back to your side while in a flat bed without using bedrails?: A Little Help needed moving from lying on your back to sitting on the side of a flat bed without using bedrails?: A Little Help needed moving to and from a bed to a chair (including a wheelchair)?: A Little Help needed standing up from a chair using your arms (e.g., wheelchair or bedside chair)?: A Little Help needed to walk in hospital room?: A Little Help needed climbing 3-5 steps with a railing? : A Lot 6 Click Score: 17    End  of Session Equipment Utilized During Treatment: Oxygen;Gait belt Activity Tolerance: Patient tolerated treatment well (patient limited by SOB) Patient left: in bed;with call bell/phone within reach;with bed alarm set Nurse Communication: Mobility status PT Visit Diagnosis: Unsteadiness on feet (R26.81);Muscle weakness (generalized) (M62.81);Difficulty in walking, not elsewhere classified (R26.2)     Time: 4132-4401 PT Time Calculation (min) (ACUTE ONLY): 31 min  Charges:  $Therapeutic Exercise: 23-37 mins                     Iva Boop, PT  07/05/21. 10:28 AM

## 2021-07-05 NOTE — Progress Notes (Signed)
D/C instructions reviewed with patient, multiple questions but questions were answered by this nurse to patient's satisfaction. Patient states he does not have any medications at hom, will notify MD and social worker. IV and telemetry removed. To be transported home via EMS.

## 2021-07-07 LAB — CULTURE, BLOOD (ROUTINE X 2)
Culture: NO GROWTH
Culture: NO GROWTH
Special Requests: ADEQUATE
Special Requests: ADEQUATE

## 2021-08-26 ENCOUNTER — Emergency Department: Payer: Medicare HMO

## 2021-08-26 ENCOUNTER — Inpatient Hospital Stay
Admission: EM | Admit: 2021-08-26 | Discharge: 2021-09-08 | DRG: 871 | Disposition: A | Discharge status: Skilled Nursing Facility | Payer: Medicare HMO | Attending: Internal Medicine | Admitting: Internal Medicine

## 2021-08-26 ENCOUNTER — Encounter: Payer: Self-pay | Admitting: Emergency Medicine

## 2021-08-26 ENCOUNTER — Other Ambulatory Visit: Payer: Self-pay

## 2021-08-26 DIAGNOSIS — Z681 Body mass index (BMI) 19 or less, adult: Secondary | ICD-10-CM | POA: Diagnosis not present

## 2021-08-26 DIAGNOSIS — F419 Anxiety disorder, unspecified: Secondary | ICD-10-CM | POA: Diagnosis present

## 2021-08-26 DIAGNOSIS — J9611 Chronic respiratory failure with hypoxia: Secondary | ICD-10-CM | POA: Diagnosis present

## 2021-08-26 DIAGNOSIS — I3139 Other pericardial effusion (noninflammatory): Secondary | ICD-10-CM | POA: Diagnosis present

## 2021-08-26 DIAGNOSIS — Z515 Encounter for palliative care: Secondary | ICD-10-CM | POA: Diagnosis not present

## 2021-08-26 DIAGNOSIS — I2781 Cor pulmonale (chronic): Secondary | ICD-10-CM | POA: Diagnosis present

## 2021-08-26 DIAGNOSIS — Z7951 Long term (current) use of inhaled steroids: Secondary | ICD-10-CM

## 2021-08-26 DIAGNOSIS — R634 Abnormal weight loss: Secondary | ICD-10-CM | POA: Diagnosis present

## 2021-08-26 DIAGNOSIS — E43 Unspecified severe protein-calorie malnutrition: Secondary | ICD-10-CM | POA: Diagnosis present

## 2021-08-26 DIAGNOSIS — F32A Depression, unspecified: Secondary | ICD-10-CM | POA: Diagnosis present

## 2021-08-26 DIAGNOSIS — R0602 Shortness of breath: Principal | ICD-10-CM

## 2021-08-26 DIAGNOSIS — D75839 Thrombocytosis, unspecified: Secondary | ICD-10-CM | POA: Diagnosis present

## 2021-08-26 DIAGNOSIS — J189 Pneumonia, unspecified organism: Secondary | ICD-10-CM

## 2021-08-26 DIAGNOSIS — I2510 Atherosclerotic heart disease of native coronary artery without angina pectoris: Secondary | ICD-10-CM | POA: Diagnosis present

## 2021-08-26 DIAGNOSIS — J90 Pleural effusion, not elsewhere classified: Secondary | ICD-10-CM | POA: Diagnosis not present

## 2021-08-26 DIAGNOSIS — R6520 Severe sepsis without septic shock: Secondary | ICD-10-CM | POA: Diagnosis present

## 2021-08-26 DIAGNOSIS — A419 Sepsis, unspecified organism: Principal | ICD-10-CM | POA: Diagnosis present

## 2021-08-26 DIAGNOSIS — R64 Cachexia: Secondary | ICD-10-CM | POA: Diagnosis present

## 2021-08-26 DIAGNOSIS — Z7189 Other specified counseling: Secondary | ICD-10-CM | POA: Diagnosis not present

## 2021-08-26 DIAGNOSIS — J9621 Acute and chronic respiratory failure with hypoxia: Secondary | ICD-10-CM | POA: Diagnosis present

## 2021-08-26 DIAGNOSIS — E440 Moderate protein-calorie malnutrition: Secondary | ICD-10-CM | POA: Diagnosis present

## 2021-08-26 DIAGNOSIS — I700 Atherosclerosis of aorta: Secondary | ICD-10-CM | POA: Diagnosis present

## 2021-08-26 DIAGNOSIS — E871 Hypo-osmolality and hyponatremia: Secondary | ICD-10-CM | POA: Diagnosis present

## 2021-08-26 DIAGNOSIS — I5032 Chronic diastolic (congestive) heart failure: Secondary | ICD-10-CM | POA: Diagnosis present

## 2021-08-26 DIAGNOSIS — E873 Alkalosis: Secondary | ICD-10-CM | POA: Diagnosis not present

## 2021-08-26 DIAGNOSIS — J441 Chronic obstructive pulmonary disease with (acute) exacerbation: Secondary | ICD-10-CM | POA: Diagnosis present

## 2021-08-26 DIAGNOSIS — R911 Solitary pulmonary nodule: Secondary | ICD-10-CM | POA: Diagnosis present

## 2021-08-26 DIAGNOSIS — D649 Anemia, unspecified: Secondary | ICD-10-CM | POA: Diagnosis present

## 2021-08-26 DIAGNOSIS — Z20822 Contact with and (suspected) exposure to covid-19: Secondary | ICD-10-CM | POA: Diagnosis present

## 2021-08-26 DIAGNOSIS — J438 Other emphysema: Secondary | ICD-10-CM | POA: Diagnosis present

## 2021-08-26 DIAGNOSIS — I10 Essential (primary) hypertension: Secondary | ICD-10-CM | POA: Diagnosis present

## 2021-08-26 DIAGNOSIS — J449 Chronic obstructive pulmonary disease, unspecified: Secondary | ICD-10-CM | POA: Diagnosis not present

## 2021-08-26 DIAGNOSIS — R0902 Hypoxemia: Secondary | ICD-10-CM

## 2021-08-26 DIAGNOSIS — I959 Hypotension, unspecified: Secondary | ICD-10-CM

## 2021-08-26 DIAGNOSIS — Z880 Allergy status to penicillin: Secondary | ICD-10-CM

## 2021-08-26 DIAGNOSIS — Z8249 Family history of ischemic heart disease and other diseases of the circulatory system: Secondary | ICD-10-CM

## 2021-08-26 DIAGNOSIS — I110 Hypertensive heart disease with heart failure: Secondary | ICD-10-CM | POA: Diagnosis present

## 2021-08-26 DIAGNOSIS — F17210 Nicotine dependence, cigarettes, uncomplicated: Secondary | ICD-10-CM | POA: Diagnosis present

## 2021-08-26 DIAGNOSIS — Z79899 Other long term (current) drug therapy: Secondary | ICD-10-CM

## 2021-08-26 DIAGNOSIS — K219 Gastro-esophageal reflux disease without esophagitis: Secondary | ICD-10-CM | POA: Diagnosis present

## 2021-08-26 DIAGNOSIS — G4700 Insomnia, unspecified: Secondary | ICD-10-CM

## 2021-08-26 DIAGNOSIS — R54 Age-related physical debility: Secondary | ICD-10-CM | POA: Diagnosis present

## 2021-08-26 DIAGNOSIS — Z800 Family history of malignant neoplasm of digestive organs: Secondary | ICD-10-CM

## 2021-08-26 DIAGNOSIS — Z9981 Dependence on supplemental oxygen: Secondary | ICD-10-CM | POA: Diagnosis not present

## 2021-08-26 DIAGNOSIS — F17200 Nicotine dependence, unspecified, uncomplicated: Secondary | ICD-10-CM | POA: Diagnosis present

## 2021-08-26 LAB — TROPONIN I (HIGH SENSITIVITY)
Troponin I (High Sensitivity): 27 ng/L — ABNORMAL HIGH (ref <18)
Troponin I (High Sensitivity): 26 ng/L — ABNORMAL HIGH (ref <18)

## 2021-08-26 LAB — BASIC METABOLIC PANEL
Anion gap: 12 (ref 5–15)
BUN: 14 mg/dL (ref 8–23)
CO2: 33 mmol/L — ABNORMAL HIGH (ref 22–32)
Calcium: 8.8 mg/dL — ABNORMAL LOW (ref 8.9–10.3)
Chloride: 86 mmol/L — ABNORMAL LOW (ref 98–111)
Creatinine, Ser: 0.48 mg/dL — ABNORMAL LOW (ref 0.61–1.24)
GFR, Estimated: >60 mL/min (ref >60)
Glucose, Bld: 73 mg/dL (ref 70–99)
Potassium: 3.8 mmol/L (ref 3.5–5.1)
Sodium: 131 mmol/L — ABNORMAL LOW (ref 135–145)

## 2021-08-26 LAB — CBC WITH DIFFERENTIAL/PLATELET
Abs Immature Granulocytes: 0.07 10*3/uL (ref 0.00–0.07)
Basophils Absolute: 0.1 10*3/uL (ref 0.0–0.1)
Basophils Relative: 0 %
Eosinophils Absolute: 0.1 10*3/uL (ref 0.0–0.5)
Eosinophils Relative: 0 %
HCT: 38.5 % — ABNORMAL LOW (ref 39.0–52.0)
Hemoglobin: 12 g/dL — ABNORMAL LOW (ref 13.0–17.0)
Immature Granulocytes: 1 %
Lymphocytes Relative: 10 %
Lymphs Abs: 1.5 10*3/uL (ref 0.7–4.0)
MCH: 27.3 pg (ref 26.0–34.0)
MCHC: 31.2 g/dL (ref 30.0–36.0)
MCV: 87.5 fL (ref 80.0–100.0)
Monocytes Absolute: 1.1 10*3/uL — ABNORMAL HIGH (ref 0.1–1.0)
Monocytes Relative: 7 %
Neutro Abs: 12.6 10*3/uL — ABNORMAL HIGH (ref 1.7–7.7)
Neutrophils Relative %: 82 %
Platelets: 462 10*3/uL — ABNORMAL HIGH (ref 150–400)
RBC: 4.4 MIL/uL (ref 4.22–5.81)
RDW: 17.1 % — ABNORMAL HIGH (ref 11.5–15.5)
WBC: 15.4 10*3/uL — ABNORMAL HIGH (ref 4.0–10.5)
nRBC: 0 % (ref 0.0–0.2)

## 2021-08-26 LAB — RESP PANEL BY RT-PCR (FLU A&B, COVID) ARPGX2
Influenza A by PCR: NEGATIVE
Influenza B by PCR: NEGATIVE
SARS Coronavirus 2 by RT PCR: NEGATIVE

## 2021-08-26 LAB — BRAIN NATRIURETIC PEPTIDE: B Natriuretic Peptide: 70.9 pg/mL (ref 0.0–100.0)

## 2021-08-26 MED ORDER — ENOXAPARIN SODIUM 40 MG/0.4ML IJ SOSY: 40.0000 mg | PREFILLED_SYRINGE | INTRAMUSCULAR | Status: DC

## 2021-08-26 MED ORDER — SODIUM CHLORIDE 0.9 % IV SOLN: 2.0000 g | Freq: Three times a day (TID) | INTRAVENOUS | Status: DC

## 2021-08-26 MED ORDER — IPRATROPIUM-ALBUTEROL 0.5-2.5 (3) MG/3ML IN SOLN
3.0000 mL | Freq: Once | RESPIRATORY_TRACT | Status: AC
  Administered 2021-08-26: 3 mL via RESPIRATORY_TRACT
  Filled 2021-08-26: qty 3

## 2021-08-26 MED ORDER — IOHEXOL 350 MG/ML SOLN
75.0000 mL | Freq: Once | INTRAVENOUS | Status: AC | PRN
  Administered 2021-08-26: 75 mL via INTRAVENOUS

## 2021-08-26 MED ORDER — IPRATROPIUM-ALBUTEROL 0.5-2.5 (3) MG/3ML IN SOLN
3.0000 mL | Freq: Four times a day (QID) | RESPIRATORY_TRACT | Status: DC | PRN
  Administered 2021-08-27: 3 mL via RESPIRATORY_TRACT
  Filled 2021-08-26 (×2): qty 3

## 2021-08-26 MED ORDER — DULOXETINE HCL 30 MG PO CPEP
30.0000 mg | ORAL_CAPSULE | Freq: Every day | ORAL | Status: DC
Start: 2021-08-27 — End: 2021-09-08
  Administered 2021-08-27 – 2021-09-08 (×13): 30 mg via ORAL
  Filled 2021-08-26 (×13): qty 1

## 2021-08-26 MED ORDER — ONDANSETRON HCL 4 MG PO TABS: 4.0000 mg | ORAL_TABLET | Freq: Four times a day (QID) | ORAL | Status: DC | PRN

## 2021-08-26 MED ORDER — METHYLPREDNISOLONE SODIUM SUCC 125 MG IJ SOLR
125.0000 mg | Freq: Once | INTRAMUSCULAR | Status: AC
  Administered 2021-08-26: 125 mg via INTRAVENOUS
  Filled 2021-08-26: qty 2

## 2021-08-26 MED ORDER — VANCOMYCIN HCL IN DEXTROSE 1-5 GM/200ML-% IV SOLN: 1000.0000 mg | Freq: Two times a day (BID) | INTRAVENOUS | Status: DC

## 2021-08-26 MED ORDER — HEPARIN SODIUM (PORCINE) 5000 UNIT/ML IJ SOLN: 5000.0000 [IU] | Freq: Three times a day (TID) | INTRAMUSCULAR | Status: AC

## 2021-08-26 MED ORDER — ACETAMINOPHEN 325 MG PO TABS
650.0000 mg | ORAL_TABLET | Freq: Four times a day (QID) | ORAL | Status: DC | PRN
  Administered 2021-08-27 – 2021-08-31 (×3): 650 mg via ORAL
  Filled 2021-08-26 (×3): qty 2

## 2021-08-26 MED ORDER — GUAIFENESIN-DM 100-10 MG/5ML PO SYRP
5.0000 mL | ORAL_SOLUTION | Freq: Four times a day (QID) | ORAL | Status: DC | PRN
  Administered 2021-09-06: 5 mL via ORAL
  Filled 2021-08-26: qty 5

## 2021-08-26 MED ORDER — TIOTROPIUM BROMIDE MONOHYDRATE 18 MCG IN CAPS
18.0000 ug | ORAL_CAPSULE | Freq: Every day | RESPIRATORY_TRACT | Status: DC
  Administered 2021-08-27 – 2021-09-08 (×13): 18 ug via RESPIRATORY_TRACT
  Filled 2021-08-26 (×3): qty 5

## 2021-08-26 MED ORDER — ONDANSETRON HCL 4 MG/2ML IJ SOLN: 4.0000 mg | Freq: Four times a day (QID) | INTRAMUSCULAR | Status: DC | PRN

## 2021-08-26 MED ORDER — SODIUM CHLORIDE 0.9 % IV SOLN
2.0000 g | Freq: Three times a day (TID) | INTRAVENOUS | Status: DC
  Administered 2021-08-27: 2 g via INTRAVENOUS
  Filled 2021-08-26: qty 2
  Filled 2021-08-26 (×2): qty 10

## 2021-08-26 MED ORDER — NICOTINE 21 MG/24HR TD PT24
21.0000 mg | MEDICATED_PATCH | Freq: Every day | TRANSDERMAL | Status: DC | PRN
  Administered 2021-08-27 – 2021-09-05 (×8): 21 mg via TRANSDERMAL
  Filled 2021-08-26 (×9): qty 1

## 2021-08-26 MED ORDER — ATORVASTATIN CALCIUM 20 MG PO TABS
40.0000 mg | ORAL_TABLET | Freq: Every day | ORAL | Status: DC
  Administered 2021-08-27 – 2021-09-08 (×12): 40 mg via ORAL
  Filled 2021-08-26 (×12): qty 2

## 2021-08-26 MED ORDER — SODIUM CHLORIDE 1 G PO TABS
2.0000 g | ORAL_TABLET | Freq: Two times a day (BID) | ORAL | Status: DC
  Administered 2021-08-27 – 2021-09-08 (×25): 2 g via ORAL
  Filled 2021-08-26 (×27): qty 2

## 2021-08-26 MED ORDER — LEVOFLOXACIN IN D5W 750 MG/150ML IV SOLN
750.0000 mg | INTRAVENOUS | Status: DC
  Administered 2021-08-27 – 2021-08-30 (×4): 750 mg via INTRAVENOUS
  Filled 2021-08-26 (×4): qty 150

## 2021-08-26 MED ORDER — ACETAMINOPHEN 650 MG RE SUPP: 650.0000 mg | Freq: Four times a day (QID) | RECTAL | Status: DC | PRN

## 2021-08-26 MED ORDER — LISINOPRIL 5 MG PO TABS
10.0000 mg | ORAL_TABLET | Freq: Every day | ORAL | Status: DC
  Administered 2021-08-27 – 2021-08-29 (×3): 10 mg via ORAL
  Filled 2021-08-26 (×3): qty 2

## 2021-08-26 MED ORDER — FLUTICASONE FUROATE-VILANTEROL 100-25 MCG/ACT IN AEPB
1.0000 | INHALATION_SPRAY | Freq: Every day | RESPIRATORY_TRACT | Status: DC
  Administered 2021-08-27 – 2021-08-29 (×3): 1 via RESPIRATORY_TRACT
  Filled 2021-08-26: qty 28

## 2021-08-26 MED ORDER — VANCOMYCIN HCL IN DEXTROSE 1-5 GM/200ML-% IV SOLN
1000.0000 mg | Freq: Once | INTRAVENOUS | Status: AC
  Administered 2021-08-26: 1000 mg via INTRAVENOUS
  Filled 2021-08-26: qty 200

## 2021-08-26 MED ORDER — DILTIAZEM HCL 30 MG PO TABS
120.0000 mg | ORAL_TABLET | Freq: Two times a day (BID) | ORAL | Status: DC
  Administered 2021-08-27 – 2021-08-30 (×8): 120 mg via ORAL
  Filled 2021-08-26 (×5): qty 4
  Filled 2021-08-26: qty 2
  Filled 2021-08-26 (×2): qty 4

## 2021-08-26 MED ORDER — VANCOMYCIN HCL 500 MG/100ML IV SOLN
500.0000 mg | Freq: Once | INTRAVENOUS | Status: AC
  Administered 2021-08-27: 500 mg via INTRAVENOUS
  Filled 2021-08-26 (×2): qty 100

## 2021-08-26 MED ORDER — PANTOPRAZOLE SODIUM 20 MG PO TBEC
20.0000 mg | DELAYED_RELEASE_TABLET | Freq: Every day | ORAL | Status: DC
  Administered 2021-08-27 – 2021-09-08 (×13): 20 mg via ORAL
  Filled 2021-08-26 (×13): qty 1

## 2021-08-26 MED ORDER — MONTELUKAST SODIUM 10 MG PO TABS
10.0000 mg | ORAL_TABLET | Freq: Every day | ORAL | Status: DC
  Administered 2021-08-27 – 2021-09-07 (×12): 10 mg via ORAL
  Filled 2021-08-26 (×12): qty 1

## 2021-08-26 NOTE — Assessment & Plan Note (Addendum)
-   Patient meeting severe sepsis criteria with increased heart rate, increased respiration rate, hypoxia requiring BiPAP on initial presentation to the ED, elevated white cell count, source of loculated pleural effusion versus pneumonia ?- abx as above ?

## 2021-08-26 NOTE — Assessment & Plan Note (Addendum)
-   Appears baseline at this time ?

## 2021-08-26 NOTE — ED Triage Notes (Signed)
Pt coming from via [facility]. Per EMS, pt been having SOB that started a few fays ago. Pt oxygen was 86-87% on 5L [place]. EMS then placed pt on NRB 15L and pt went up to 97% and pt stats dropped again. EMS then placed pt on CPAP at 7.5L and pt's stats went to 96-97%.  ? ?Per EMS, Pt did have an oxygen flare up from smoking about two weeks ago. EMS stated pt's airway looked fine.  ? ?Pt does have a hx CHF and COPD. Pt states he wears 4L [place] chronically.  ?

## 2021-08-26 NOTE — Assessment & Plan Note (Signed)
-   Nicotine patch as needed ordered ?

## 2021-08-26 NOTE — Assessment & Plan Note (Addendum)
--  on home 4-5L O2 at baseline.   ?- Initially on BiPAP, now back on 5L O2 ?- Multifactorial including right-sided loculated pleural effusion, community-acquired pneumonia, severe COPD. ?--s/p right thoracentesis  ?--s/p IV Lasix  ?--completed 10 days of abx for PNA ?Plan: ?--no further standing diuretic, per pulm  ?--Continue supplemental O2 to keep sats between 88-92% ?

## 2021-08-26 NOTE — Assessment & Plan Note (Addendum)
-   Dietary consulted, supplements per dietician  ?

## 2021-08-26 NOTE — Consult Note (Addendum)
Pharmacy Antibiotic Note ? ?Je[facility]iah [name] is a 71 y.o. male admitted on 08/26/2021 with sepsis.  Pharmacy has been consulted for Cefepime dosing. ? ?Patient with documented penicillin allergy but has tolerated multiple doses of ceftriaxone and previously tolerated cefepime as well.  ? ?Plan: ?Give cefepime 2g IV every 8 hours ?Give vancomycin 1.5g IV once followed by vancomycin 1g IV every 12 hours ?Continue to monitor renal function daily with AM labs and adjust dose accordingly ?Goal AUC 400-550  ?Est AUC: 462.8 ?Est Cmax:30.4 ?Est Cmin:12.2 ?Calculated with SCr 0.8 ? ? ?Height: 6' (182.9 cm) ?Weight: 72.6 kg (160 lb) ?IBW/kg (Calculated) : 77.6 ? ?Temp (24hrs), Avg:97.3 ?F (36.3 ?C), Min:97.3 ?F (36.3 ?C), Max:97.3 ?F (36.3 ?C) ? ?Recent Labs  ?Lab 08/26/21 ?7341  ?WBC 15.4*  ?CREATININE 0.48*  ?  ?Estimated Creatinine Clearance: 88.2 mL/min (A) (by C-G formula based on SCr of 0.48 mg/dL (L)).   ? ?Allergies  ?Allergen Reactions  ? Penicillins Anaphylaxis, Swelling and Other (See Comments)  ?  Tolerated Ceftriaxone 06/2020 ?05/2020 childhood reaction and his mother told him that he "swelled up." ? ?Had a PCN reaction causing immediate rash, facial/tongue/throat swelling, SOB or lightheadedness with hypotension: Yes ?Had a PCN reaction causing severe rash involving mucus membranes or skin necrosis: No ?Had a PCN reaction that required hospitalization No ?Had a PCN reaction occurring within the last 10 years: No ?If all the above answers are "NO", may proceed with Cephalosporin use.  ? ? ?Antimicrobials this admission: ?Cefepime  >>  ?Vancomycin >>  ? ?Dose adjustments this admission: ? ?Microbiology results: ?4/19 BCx:  ? ? ?Thank you for allowing pharmacy to be a part of this pleasant patient?s care. ? ?Darrick Penna, PharmD, MS PGPM ?Clinical Pharmacist ?08/26/2021 ?10:27 PM ? ? ?

## 2021-08-26 NOTE — Assessment & Plan Note (Signed)
-   Concerning for carcinoma ?

## 2021-08-26 NOTE — Assessment & Plan Note (Addendum)
-   cont home duloxetine 30 mg daily ?

## 2021-08-26 NOTE — ED Provider Notes (Signed)
? ?[facility] ?Provider Note ? ? ? Event Date/Time  ? First MD Initiated Contact with Patient 08/26/21 1725   ?  (approximate) ? ? ?History  ? ?Shortness of Breath ? ? ?HPI ? ?[facility]iah [name] is a 71 y.o. male  who, per discharge summary dated 07/05/21 has history of COPD, CHF and had admission for SOB, who presents to the emergency department today because of concern for SOB.  Patient states that he has been feeling increasingly short of breath over the past week.  This has been accompanied by some chest pain across the front of his chest and up the sides.  He has had associated cough.  He is on 4 L of oxygen at baseline.  When EMS arrived they found to be hypoxic on his home oxygen.  Because of this he was placed on initially nonrebreather but when he continued to have hypoxia they placed him on CPAP.  Patient states he does feel somewhat better on the CPAP although dislikes it. He denies any fevers.  ? ?Physical Exam  ? ?Triage Vital Signs: ?ED Triage Vitals  ?Enc Vitals Group  ?   BP 08/26/21 1735 129/83  ?   Pulse Rate 08/26/21 1735 (!) 103  ?   Resp 08/26/21 1735 (!) 25  ?   Temp --   ?   Temp src --   ?   SpO2 08/26/21 1720 97 %  ?   Weight 08/26/21 1727 160 lb (72.6 kg)  ?   Height 08/26/21 1727 6' (1.829 m)  ?   Head Circumference --   ?   Peak Flow --   ?   Pain Score 08/26/21 1726 8  ?   Pain Loc --   ?   Pain Edu? --   ?   Excl. in [place]? --   ? ? ?Most recent vital signs: ?Vitals:  ? 08/26/21 1726 08/26/21 1735  ?BP:  129/83  ?Pulse:  (!) 103  ?Resp:  (!) 25  ?SpO2: 96% 100%  ? ? ?General: Awake, alert, oriented ?CV:  Good peripheral perfusion. Tachycardic ?Resp:  Increased respiratory effort, poor air movement diffusely ?Abd:  No distention.  ? ? ?ED Results / Procedures / Treatments  ? ?Labs ?(all labs ordered are listed, but only abnormal results are displayed) ?Labs Reviewed  ?CBC WITH DIFFERENTIAL/PLATELET - Abnormal; Notable for the following components:  ?    Result Value   ? WBC 15.4 (*)   ? Hemoglobin 12.0 (*)   ? HCT 38.5 (*)   ? RDW 17.1 (*)   ? Platelets 462 (*)   ? Neutro Abs 12.6 (*)   ? Monocytes Absolute 1.1 (*)   ? All other components within normal limits  ?BASIC METABOLIC PANEL - Abnormal; Notable for the following components:  ? Sodium 131 (*)   ? Chloride 86 (*)   ? CO2 33 (*)   ? Creatinine, Ser 0.48 (*)   ? Calcium 8.8 (*)   ? All other components within normal limits  ?TROPONIN I (HIGH SENSITIVITY) - Abnormal; Notable for the following components:  ? Troponin I (High Sensitivity) 27 (*)   ? All other components within normal limits  ?TROPONIN I (HIGH SENSITIVITY) - Abnormal; Notable for the following components:  ? Troponin I (High Sensitivity) 26 (*)   ? All other components within normal limits  ?RESP PANEL BY RT-PCR (FLU A&B, COVID) [id number]  ?BRAIN NATRIURETIC PEPTIDE  ? ? ? ?EKG ? ?I,  Nance Pear, attending physician, personally viewed and interpreted this EKG ? ?EKG Time: 1757 ?Rate: 101 ?Rhythm: sinus tachycardia ?Axis: left axis deviation ?Intervals: qtc 442 ?QRS: narrow ?ST changes: no st elevation ?Impression: abnormal ekg ? ? ? ?RADIOLOGY ?I independently interpreted and visualized the CXR. My interpretation: Right pleural effusion ?Radiology interpretation: IMPRESSION:  ?1. Interval increase of an at least small volume right pleural  ?effusion.  ?2. Aortic Atherosclerosis (ICD10-I70.0) and Emphysema (ICD10-J43.9).  ? ?Ct angio pe ?IMPRESSION:  ?1. Marked severity emphysematous lung disease with marked severity  ?right lower lobe consolidation.  ?2. Large partially loculated right-sided pleural effusion.  ?3. Fracture deformities involving the superior endplates of the T8  ?and T11 vertebral bodies, new when compared to the prior exam.  ?4. New 12 mm x 6 mm spiculated lung nodule versus area of focal  ?scarring within the posterior aspect of the left lower lobe.  ?Correlation with follow-up PET-CT or tissue sampling is recommended.  ?This recommendation  follows the consensus statement: Guidelines for  ?Management of Incidental Pulmonary Nodules Detected on CT Images:  ?From the Fleischner Society 2017; Radiology 2017; 284:228-243.  ?5. Small, approximately 11 mm thick anterior pericardial effusion.  ?6. Coronary artery disease.  ?   ?Aortic Atherosclerosis (ICD10-I70.0) and Emphysema (ICD10-J43.9).  ?   ? ? ?PROCEDURES: ? ?Critical Care performed: Yes, see critical care procedure note(s) ? ?Procedures ? ?CRITICAL CARE ?Performed by: Nance Pear ? ? ?Total critical care time: 35 minutes ? ?Critical care time was exclusive of separately billable procedures and treating other patients. ? ?Critical care was necessary to treat or prevent imminent or life-threatening deterioration. ? ?Critical care was time spent personally by me on the following activities: development of treatment plan with patient and/or surrogate as well as nursing, discussions with consultants, evaluation of patient's response to treatment, examination of patient, obtaining history from patient or surrogate, ordering and performing treatments and interventions, ordering and review of laboratory studies, ordering and review of radiographic studies, pulse oximetry and re-evaluation of patient's condition. ? ? ?MEDICATIONS ORDERED IN ED: ?Medications - No data to display ? ? ?IMPRESSION / MDM / ASSESSMENT AND PLAN / ED COURSE  ?I reviewed the triage vital signs and the nursing notes. ?             ?               ? ?Differential diagnosis includes, but is not limited to, COPD, CHF, pneumonia, pneumothorax. ? ?Patient presented to the emergency department today because of concerns for shortness of breath.  Patient is typically on 4 L of oxygen at baseline.  EMS found him hypoxic on his home oxygen so placed him on initially on nonrebreather and then transferred over to CPAP.  Patient was transferred to BiPAP upon arrival to the emergency department.  Chest x-ray did show a right pleural effusion.   Blood work with leukocytosis.  I did obtain a CT scan which is concerning for consolidation.  Patient did have a hospitalization 2 months ago so we will treat for HCAP.  We were able to get patient off of BiPAP here in the emergency department and placed on high flow nasal cannula.  Discussed findings with patient.  Discussed with Dr. Tobie Poet with the hospitalist service who will plan on admission. ? ? ? ?FINAL CLINICAL IMPRESSION(S) / ED DIAGNOSES  ? ?Final diagnoses:  ?Shortness of breath  ?Hypoxia  ?Pleural effusion  ?Pneumonia due to infectious organism, unspecified laterality, unspecified part of  lung  ? ? ? ? ? ?Note:  This document was prepared using Dragon voice recognition software and may include unintentional dictation errors. ? ?  ?Nance Pear, MD ?08/26/21 2115 ? ?

## 2021-08-26 NOTE — Assessment & Plan Note (Addendum)
-  Pt has baseline severe COPD  ?--pulm consulted, Dr. Mortimer Fries ?--IV solumedrol transitioned to prednisone 40 mg daily ?Plan: ?--taper prednisone to 20 mg daily ?--cont Xopenex neb and pulmicort neb ?--cont Spiriva ?--cont Mucomyst neb BID for mucus clearance ?--encourage flutter valve use ?

## 2021-08-26 NOTE — Assessment & Plan Note (Signed)
-   On baseline 4 to 5 L nasal cannula ?

## 2021-08-26 NOTE — Assessment & Plan Note (Addendum)
-   With partial loculation ?--s/p right thora with 1.2L removal ?--outpatient f/u with Dr. Raul Del about final pleural fluid path, to rule out mesothelioma.   ?

## 2021-08-26 NOTE — Assessment & Plan Note (Addendum)
-   cont home PPI ?

## 2021-08-26 NOTE — H&P (Addendum)
History and Physical   Jeremiah [name]:[id number] DOB: 1950-07-17 DOA: 08/26/2021  PCP: [facility]  Outpatient Specialists: Dr. Meredeth Ide, pulmonology Patient coming from: Home via EMS  I have personally briefly reviewed patient's old medical records in [facility] EMR.  Chief Concern: Shortness of breath  HPI: Jeremiah [name] is a 71 year old male with history of tobacco dependence, depression, anxiety, COPD, hypertension, GERD, chronic respiratory failure on 4 to 5 L nasal cannula at home, who presents emergency department for chief concerns of worsening shortness of breath for 2 weeks. Initial vitals in the emergency department showed temperature of 97.3, respiration rate of 25, heart rate of 102, blood pressure 119/76, SPO2 of 98% initially on BiPAP.  At bedside, patient's SPO2 is improved at 97 to 100% on 8 L of high flow nasal cannula.  Serum sodium is 131, potassium 3.8, chloride 86, bicarb of 33, BUN of 14, serum creatinine of 0.48, GFR greater than 60, nonfasting blood glucose 73, WBC elevated at 15.4, hemoglobin 12, platelets of 462.  COVID/influenza A/influenza B PCR were negative  Portable chest x-ray in the emergency department was read as interval increase with right pleural effusion.  CTA of the chest for PE with and without contrast was read as: Marked severity emphysematous lung disease with marked severity of the right lower lobe consolidation.  Large partially loculated right-sided pleural effusion.  New 12 mm x 6 mm spiculated lung nodule versus area of focal scarring within the posterior aspect of the left lower lobe.  ED treatment: DuoNeb x2, Solu-Medrol 125 mg IV one-time dose, aztreonam and vancomycin.  At bedside he is able to tell me his full name, age, he knows she is in the hospital.  Reports that he has been feeling shortness of breath for the last 2 weeks.  He states that he has had a worsening cough that is productive of dark yellow  sputum.  He denies known sick contacts.  He denies any fever.  He endorses nausea and denies vomiting.  He denies chest pain, abdominal pain, dysuria, diarrhea.  He does endorse that he has lost a significant amount of weight in the last 6 months, however he is not able to tell me the specific pounds.  Social history: He lives at home with his friend.  He endorses current tobacco use, approximately half a pack per day.  At his peak he was smoking 1.5 packs/day.  He started when he was 71 years old.  He denies EtOH and recreational drug use.  ROS: Constitutional: no weight change, no fever ENT/Mouth: no sore throat, no rhinorrhea Eyes: no eye pain, no vision changes Cardiovascular: no chest pain, + dyspnea,  no edema, no palpitations Respiratory: + cough, + sputum, + wheezing Gastrointestinal: no nausea, no vomiting, no diarrhea, no constipation Genitourinary: no urinary incontinence, no dysuria, no hematuria Musculoskeletal: no arthralgias, no myalgias Skin: no skin lesions, no pruritus, Neuro: + weakness, no loss of consciousness, no syncope Psych: no anxiety, no depression, + decrease appetite Heme/Lymph: no bruising, no bleeding  ED Course: Discussed with emergency medicine provider, patient requiring hospitalization for chief concerns of acute on chronic hypoxia.  Assessment/Plan  Principal Problem:   Acute on chronic respiratory failure with hypoxia (HCC) Active Problems:   Tobacco dependency   COPD, moderate (HCC)   Hyponatremia   HTN (hypertension)   Anemia   GERD (gastroesophageal reflux disease)   Malnutrition of moderate degree   Unintentional weight loss   Protein-calorie malnutrition, severe  Chronic hypoxemic respiratory failure (HCC)   Left lower lobe pulmonary nodule   Depression   Sepsis due to pneumonia (HCC)   Pleural effusion on right   Severe sepsis (HCC)   Assessment and Plan:  * Acute on chronic respiratory failure with hypoxia (HCC) -  Multifactorial including right-sided loculated pleural effusion, community-acquired pneumonia, pericardial effusion, versus meeting sepsis criteria - Initially on BiPAP, now transition to high flow - Continue high flow nasal cannula - DuoNebs every 6 hours as needed for shortness of breath and wheezing ordered - Check blood cultures x2 - Admit to stepdown, inpatient  Severe sepsis (HCC) - Increased respiration rate, heart rate, with respiratory failure, and source of pneumonia - Blood cultures, lactic acid, broad-spectrum antibiotic - Message nursing staff to obtain blood cultures x2 and lactic acid - No septic shock as patient has been gaining appropriate MAP  Pleural effusion on right - With partial loculation - Thoracentesis via ultrasound has been ordered for the right side with labs - Check Legionella urine antigen and culture  Sepsis due to pneumonia [facility]) - Patient meeting severe sepsis criteria with increased heart rate, increased respiration rate, hypoxia requiring BiPAP on initial presentation to the ED, elevated white cell count, source of loculated pleural effusion versus pneumonia - Continue with Vancomycin, aztreonam, and levaquin per pharmacy - Blood cultures x2 ordered, lactic acid x2, procalcitonin ordered, UA -Check Legionella in the urine and culture - Admit to stepdown, inpatient  Depression - Resumed home duloxetine 30 mg daily  Left lower lobe pulmonary nodule - On CTA of chest on 08/26/2021, described as spiculated, 12 x 6 mm - Discussed extensively with patient at bedside that he will need outpatient follow-up - Patient endorses understanding and compliance and repeat the information and instructions to me  Chronic hypoxemic respiratory failure (HCC) - On baseline 4 to 5 L nasal cannula  Protein-calorie malnutrition, severe - Dietary consulted  Unintentional weight loss - Concerning for carcinoma  GERD (gastroesophageal reflux disease) - Resumed home  PPI  HTN (hypertension) - Resumed home diltiazem 120 mg p.o. twice daily, lisinopril 10 mg daily  Hyponatremia - Appears baseline at this time - However given loculated pleural effusion and severe sepsis presentation, we will check for Legionella  COPD, moderate (HCC) - Resumed home maintenance inhalers alternative equivalent, tiotropium 18 mcg inhalation daily, DuoNebs every 6 hours as needed for shortness of breath and wheezing  Tobacco dependency - Nicotine patch as needed ordered  Chart reviewed.   DVT prophylaxis: Heparin 5000 units one-time dose ordered Code Status: Full code Diet: Heart healthy Family Communication: No Disposition Plan: Guarded prognosis, pending clinical course, anticipate more than 2 night stay Consults called: IR consulted for ultrasound-guided thoracentesis Admission status: Stepdown, inpatient  Past Medical History:  Diagnosis Date   Acute and chronic respiratory failure (acute-on-chronic) (HCC) 10/18/2020   Asthma    COPD (chronic obstructive pulmonary disease) (HCC)    O2 dependent - 2L   GERD (gastroesophageal reflux disease)    HTN (hypertension)    Multifocal pneumonia 06/16/2020   Other emphysema (HCC) 08/22/2016   Oxygen dependent    Sepsis (HCC) 05/14/2020   Tobacco dependence    Past Surgical History:  Procedure Laterality Date   CATARACT EXTRACTION W/PHACO Left 12/09/2019   Procedure: CATARACT EXTRACTION PHACO AND INTRAOCULAR LENS PLACEMENT (IOC) COMPLICATED LEFT 11.21 01:03.8;  Surgeon: Galen Manila, MD;  Location: [facility];  Service: Ophthalmology;  Laterality: Left;  MILOOP VISION BLUE   CATARACT EXTRACTION W/PHACO Right  01/27/2020   Procedure: CATARACT EXTRACTION PHACO AND INTRAOCULAR LENS PLACEMENT (IOC) RIGHT VISION BLUE 26.83 02:07.6;  Surgeon: Galen Manila, MD;  Location: [facility];  Service: Ophthalmology;  Laterality: Right;   HERNIA REPAIR     NECK SURGERY     Social History:  reports that he has  been smoking cigarettes. He has a 25.00 pack-year smoking history. He has never used smokeless tobacco. He reports current alcohol use. He reports that he does not use drugs.  Allergies  Allergen Reactions   Penicillins Anaphylaxis, Swelling and Other (See Comments)    Tolerated Ceftriaxone 06/2020 05/2020 childhood reaction and his mother told him that he "swelled up."  Had a PCN reaction causing immediate rash, facial/tongue/throat swelling, SOB or lightheadedness with hypotension: Yes Had a PCN reaction causing severe rash involving mucus membranes or skin necrosis: No Had a PCN reaction that required hospitalization No Had a PCN reaction occurring within the last 10 years: No If all the above answers are "NO", may proceed with Cephalosporin use.   Family History  Problem Relation Age of Onset   Cancer - Colon Father    Congestive Heart Failure Mother    Family history: Family history reviewed and not pertinent  Prior to Admission medications   Medication Sig Start Date End Date Taking? Authorizing Provider  acetaminophen (TYLENOL) 325 MG tablet Take 2 tablets (650 mg total) by mouth every 6 (six) hours as needed for mild pain (or Fever >/= 101). 08/06/15  Yes Auburn Bilberry, MD  albuterol (VENTOLIN HFA) 108 (90 Base) MCG/ACT inhaler Inhale into the lungs every 6 (six) hours as needed for wheezing or shortness of breath.   Yes [provider]  atorvastatin (LIPITOR) 40 MG tablet Take 40 mg by mouth daily. 07/23/21  Yes [provider]  BREO ELLIPTA 100-25 MCG/INH AEPB Inhale 1 puff into the lungs daily. 09/30/20  Yes Lurene Shadow, MD  diltiazem (CARDIZEM) 120 MG tablet Take 1 tablet (120 mg total) by mouth 2 (two) times daily. 09/30/20  Yes Lurene Shadow, MD  DULoxetine (CYMBALTA) 30 MG capsule Take 30 mg by mouth daily.   Yes [provider]  furosemide (LASIX) 20 MG tablet Take 0.5 tablets (10 mg total) by mouth daily as needed for edema. 06/24/21  Yes  Rodolph Bong, MD  guaiFENesin-dextromethorphan (ROBITUSSIN DM) 100-10 MG/5ML syrup Take 5 mLs by mouth every 6 (six) hours as needed for cough.   Yes [provider]  ipratropium-albuterol (DUONEB) 0.5-2.5 (3) MG/3ML SOLN Take 3 mLs by nebulization every 6 (six) hours as needed. 09/30/20  Yes Lurene Shadow, MD  lisinopril (ZESTRIL) 10 MG tablet Take 10 mg by mouth daily. 07/23/21  Yes [provider]  montelukast (SINGULAIR) 10 MG tablet Take 1 tablet (10 mg total) by mouth at bedtime. 09/30/20  Yes Lurene Shadow, MD  pantoprazole (PROTONIX) 20 MG tablet Take 1 tablet (20 mg total) by mouth daily. 09/30/20  Yes Lurene Shadow, MD  sodium chloride 1 g tablet Take 2 tablets (2 g total) by mouth 2 (two) times daily with a meal. 06/24/21  Yes Rodolph Bong, MD  tiotropium (SPIRIVA) 18 MCG inhalation capsule Place 18 mcg into inhaler and inhale daily.   Yes [provider]  atorvastatin (LIPITOR) 20 MG tablet Take 20 mg by mouth daily. Patient not taking: Reported on 08/26/2021    [provider]  ferrous sulfate 325 (65 FE) MG tablet Take 1 tablet (325 mg total) by mouth daily. 07/05/21 08/04/21  Marrion Coy, MD  mupirocin ointment (BACTROBAN) 2 % Apply 1 application topically 2 (two) times daily.    [provider]  nicotine (NICODERM CQ - DOSED IN MG/24 HOURS) 21 mg/24hr patch Place 1 patch (21 mg total) onto the skin daily. 07/05/21   Marrion Coy, MD  nicotine polacrilex (NICORETTE) 2 MG gum Take 1 each (2 mg total) by mouth as needed for smoking cessation. 10/23/20   Joseph Art, DO   Physical Exam: Vitals:   08/26/21 1930 08/26/21 2000 08/26/21 2100 08/27/21 0023  BP: 117/79 122/66 126/85 125/67  Pulse: (!) 105 (!) 103 (!) 103 (!) 105  Resp: (!) 22 16 20  (!) 21  Temp:    98.3 F (36.8 C)  TempSrc:    Oral  SpO2: 95% 95% 97% 100%  Weight:      Height:       Constitutional: appears older than chronological age, frail, cachectic, NAD,  calm, comfortable Eyes: PERRL, lids and conjunctivae normal ENMT: Mucous membranes are dry. Posterior pharynx clear of any exudate or lesions. Age-appropriate dentition. Hearing appropriate Neck: normal, supple, no masses, no thyromegaly Respiratory: Diffuse wheezing on the left side, with crackles and rhonchorous on the right lung on auscultation. Normal respiratory effort. No accessory muscle use.  High flow nasal cannula in place. Cardiovascular: Regular rate and rhythm, no murmurs / rubs / gallops.  Trace bilateral lower extremity edema. 2+ pedal pulses. No carotid bruits.  Abdomen: no tenderness, no masses palpated, no hepatosplenomegaly. Bowel sounds positive.  Musculoskeletal: no clubbing / cyanosis. No joint deformity upper and lower extremities. Good ROM, no contractures, no atrophy. Normal muscle tone.  Skin: no rashes, lesions, ulcers. No induration.  Bilateral lower extremity dry skin. Neurologic: Sensation intact. Strength 5/5 in all 4.  Psychiatric: Normal judgment and insight. Alert and oriented x 3. Normal mood.   EKG: independently reviewed, showing sinus tachycardia with rate of 101, QTc 442  Chest x-ray on Admission: I personally reviewed and I agree with radiologist reading as below.  CT Angio Chest PE W and/or Wo Contrast  Result Date: 08/26/2021 CLINICAL DATA:  Shortness of breath. EXAM: CT ANGIOGRAPHY CHEST WITH CONTRAST TECHNIQUE: Multidetector CT imaging of the chest was performed using the standard protocol during bolus administration of intravenous contrast. Multiplanar CT image reconstructions and MIPs were obtained to evaluate the vascular anatomy. RADIATION DOSE REDUCTION: This exam was performed according to the departmental dose-optimization program which includes automated exposure control, adjustment of the mA and/or kV according to patient size and/or use of iterative reconstruction technique. CONTRAST:  75mL OMNIPAQUE IOHEXOL 350 MG/ML SOLN COMPARISON:  May 28, 2021 FINDINGS: Cardiovascular: There is marked severity calcification of the thoracic aorta, without evidence of aortic aneurysm or dissection. Satisfactory opacification of the pulmonary arteries to the segmental level. No evidence of pulmonary embolism. Normal heart size with marked severity coronary artery calcification. There is a small, approximately 11 mm thick anterior pericardial effusion. Mediastinum/Nodes: No enlarged mediastinal, hilar, or axillary lymph nodes. Thyroid gland, trachea, and esophagus demonstrate no significant findings. Lungs/Pleura: There is marked severity emphysematous lung disease. Marked severity consolidation is seen throughout the right lower lobe. A new 12 mm x 6 mm spiculated lung nodule versus area of focal scarring is seen within the posterior aspect of the left lower lobe (axial CT image 96, CT series 6). There is a large partially loculated right-sided pleural effusion. Upper Abdomen: No acute abnormality. Musculoskeletal: Fracture deformities are seen involving the superior endplates of the T8  and T11 vertebral bodies. These are new when compared to the prior exam. Multilevel degenerative changes are noted throughout the remainder of the thoracic spine. Review of the MIP images confirms the above findings. IMPRESSION: 1. Marked severity emphysematous lung disease with marked severity right lower lobe consolidation. 2. Large partially loculated right-sided pleural effusion. 3. Fracture deformities involving the superior endplates of the T8 and T11 vertebral bodies, new when compared to the prior exam. 4. New 12 mm x 6 mm spiculated lung nodule versus area of focal scarring within the posterior aspect of the left lower lobe. Correlation with follow-up PET-CT or tissue sampling is recommended. This recommendation follows the consensus statement: Guidelines for Management of Incidental Pulmonary Nodules Detected on CT Images: From the Fleischner Society 2017; Radiology 2017;  284:228-243. 5. Small, approximately 11 mm thick anterior pericardial effusion. 6. Coronary artery disease. Aortic Atherosclerosis (ICD10-I70.0) and Emphysema (ICD10-J43.9). Electronically Signed   By: Aram Candela M.D.   On: 08/26/2021 20:38   DG Chest Portable 1 View  Result Date: 08/26/2021 CLINICAL DATA:  sob EXAM: PORTABLE CHEST 1 VIEW COMPARISON:  Chest x-ray 06/30/2021, CT chest 05/28/2021. FINDINGS: The heart and mediastinal contours are unchanged. Aortic calcification. No focal consolidation. Chronic coarsened markings with no overt pulmonary edema. Interval increase of an at least small volume right pleural effusion. No left pleural effusion. No pneumothorax. No acute osseous abnormality. IMPRESSION: 1. Interval increase of an at least small volume right pleural effusion. 2. Aortic Atherosclerosis (ICD10-I70.0) and Emphysema (ICD10-J43.9). Electronically Signed   By: Tish Frederickson M.D.   On: 08/26/2021 17:53    Labs on Admission: I have personally reviewed following labs  CBC: Recent Labs  Lab 08/26/21 1733  WBC 15.4*  NEUTROABS 12.6*  HGB 12.0*  HCT 38.5*  MCV 87.5  PLT 462*   Basic Metabolic Panel: Recent Labs  Lab 08/26/21 1733  NA 131*  K 3.8  CL 86*  CO2 33*  GLUCOSE 73  BUN 14  CREATININE 0.48*  CALCIUM 8.8*   GFR: Estimated Creatinine Clearance: 88.2 mL/min (A) (by C-G formula based on SCr of 0.48 mg/dL (L)).  Urine analysis:    Component Value Date/Time   COLORURINE YELLOW (A) 08/27/2021 0027   APPEARANCEUR CLEAR (A) 08/27/2021 0027   APPEARANCEUR Clear 09/12/2013 0920   LABSPEC 1.028 08/27/2021 0027   LABSPEC 1.010 09/12/2013 0920   PHURINE 6.0 08/27/2021 0027   GLUCOSEU NEGATIVE 08/27/2021 0027   GLUCOSEU Negative 09/12/2013 0920   HGBUR NEGATIVE 08/27/2021 0027   BILIRUBINUR NEGATIVE 08/27/2021 0027   BILIRUBINUR Negative 09/12/2013 0920   KETONESUR 20 (A) 08/27/2021 0027   PROTEINUR NEGATIVE 08/27/2021 0027   NITRITE NEGATIVE 08/27/2021  0027   LEUKOCYTESUR NEGATIVE 08/27/2021 0027   LEUKOCYTESUR Negative 09/12/2013 0920   CRITICAL CARE Performed by: Nadyne Coombes Monike Bragdon  Total critical care time: 40 minutes  Critical care time was exclusive of separately billable procedures and treating other patients.  Critical care was necessary to treat or prevent imminent or life-threatening deterioration.  Critical care was time spent personally by me on the following activities: development of treatment plan with patient and/or surrogate as well as nursing, discussions with consultants, evaluation of patient's response to treatment, examination of patient, obtaining history from patient or surrogate, ordering and performing treatments and interventions, ordering and review of laboratory studies, ordering and review of radiographic studies, pulse oximetry and re-evaluation of patient's condition.  Dr. Sedalia Muta [facility]  If 7PM-7AM, please contact overnight-coverage provider If 7AM-7PM, please contact  day coverage provider [email or web address]  08/27/2021, 1:43 AM

## 2021-08-26 NOTE — Hospital Course (Addendum)
Mr. Cross Jorge is a 71 year old male with history of tobacco dependence, depression, anxiety, COPD, hypertension, GERD, chronic respiratory failure on 4 to 5 L nasal cannula at home, who presents emergency department for chief concerns of worsening shortness of breath for 2 weeks. ?Initial vitals in the emergency department showed temperature of 97.3, respiration rate of 25, heart rate of 102, blood pressure 119/76, SPO2 of 98% initially on BiPAP. ? ?At bedside, patient's SPO2 is improved at 97 to 100% on 8 L of high flow nasal cannula. ? ?Serum sodium is 131, potassium 3.8, chloride 86, bicarb of 33, BUN of 14, serum creatinine of 0.48, GFR greater than 60, nonfasting blood glucose 73, WBC elevated at 15.4, hemoglobin 12, platelets of 462.  COVID/influenza A/influenza B PCR were negative ? ?Portable chest x-ray in the emergency department was read as interval increase with right pleural effusion. ? ?CTA of the chest for PE with and without contrast was read as: Marked severity emphysematous lung disease with marked severity of the right lower lobe consolidation.  Large partially loculated right-sided pleural effusion.  New 12 mm x 6 mm spiculated lung nodule versus area of focal scarring within the posterior aspect of the left lower lobe. ? ?ED treatment: DuoNeb x2, Solu-Medrol 125 mg IV one-time dose, aztreonam and vancomycin. ?

## 2021-08-26 NOTE — Assessment & Plan Note (Addendum)
-  BP currently low normal ?--hold home cardizem, Lisinopril ?

## 2021-08-26 NOTE — Progress Notes (Signed)
Transported patient to CT scan w/o incident. Weaned to bubble high flow at 15L during cleaning of patient. Bipap on standby. MD aware.  ?

## 2021-08-26 NOTE — Assessment & Plan Note (Addendum)
-   On CTA of chest on 08/26/2021, described as spiculated, 12 x 6 mm ?- Discussed extensively with patient at bedside that he will need outpatient follow-up.  Pt plans to establish with Dr. Raul Del ?

## 2021-08-26 NOTE — ED Notes (Signed)
Pt had an accident and soiled himself. This RN cleaned pt up and placed soiled clothes in pt belongings bag. Pt was placed in hospital gown and new brief.  ?

## 2021-08-27 DIAGNOSIS — J9621 Acute and chronic respiratory failure with hypoxia: Secondary | ICD-10-CM | POA: Diagnosis not present

## 2021-08-27 DIAGNOSIS — R6520 Severe sepsis without septic shock: Secondary | ICD-10-CM | POA: Diagnosis present

## 2021-08-27 DIAGNOSIS — E43 Unspecified severe protein-calorie malnutrition: Secondary | ICD-10-CM | POA: Diagnosis not present

## 2021-08-27 DIAGNOSIS — J90 Pleural effusion, not elsewhere classified: Secondary | ICD-10-CM

## 2021-08-27 DIAGNOSIS — A419 Sepsis, unspecified organism: Secondary | ICD-10-CM | POA: Diagnosis present

## 2021-08-27 LAB — CBC
HCT: 33.6 % — ABNORMAL LOW (ref 39.0–52.0)
Hemoglobin: 10.7 g/dL — ABNORMAL LOW (ref 13.0–17.0)
MCH: 27.2 pg (ref 26.0–34.0)
MCHC: 31.8 g/dL (ref 30.0–36.0)
MCV: 85.3 fL (ref 80.0–100.0)
Platelets: 412 10*3/uL — ABNORMAL HIGH (ref 150–400)
RBC: 3.94 MIL/uL — ABNORMAL LOW (ref 4.22–5.81)
RDW: 16.9 % — ABNORMAL HIGH (ref 11.5–15.5)
WBC: 7 10*3/uL (ref 4.0–10.5)
nRBC: 0 % (ref 0.0–0.2)

## 2021-08-27 LAB — URINALYSIS, COMPLETE (UACMP) WITH MICROSCOPIC
Bacteria, UA: NONE SEEN
Bilirubin Urine: NEGATIVE
Glucose, UA: NEGATIVE mg/dL
Hgb urine dipstick: NEGATIVE
Ketones, ur: 20 mg/dL — AB
Leukocytes,Ua: NEGATIVE
Nitrite: NEGATIVE
Protein, ur: NEGATIVE mg/dL
Specific Gravity, Urine: 1.028 (ref 1.005–1.030)
pH: 6 (ref 5.0–8.0)

## 2021-08-27 LAB — BASIC METABOLIC PANEL
Anion gap: 7 (ref 5–15)
BUN: 14 mg/dL (ref 8–23)
CO2: 36 mmol/L — ABNORMAL HIGH (ref 22–32)
Calcium: 8.4 mg/dL — ABNORMAL LOW (ref 8.9–10.3)
Chloride: 87 mmol/L — ABNORMAL LOW (ref 98–111)
Creatinine, Ser: 0.39 mg/dL — ABNORMAL LOW (ref 0.61–1.24)
GFR, Estimated: >60 mL/min (ref >60)
Glucose, Bld: 226 mg/dL — ABNORMAL HIGH (ref 70–99)
Potassium: 4 mmol/L (ref 3.5–5.1)
Sodium: 130 mmol/L — ABNORMAL LOW (ref 135–145)

## 2021-08-27 LAB — PROCALCITONIN: Procalcitonin: <0.1 ng/mL

## 2021-08-27 LAB — CREATININE, SERUM
Creatinine, Ser: 0.49 mg/dL — ABNORMAL LOW (ref 0.61–1.24)
GFR, Estimated: >60 mL/min (ref >60)

## 2021-08-27 LAB — LACTIC ACID, PLASMA: Lactic Acid, Venous: 0.9 mmol/L (ref 0.5–1.9)

## 2021-08-27 LAB — MRSA NEXT GEN BY PCR, NASAL: MRSA by PCR Next Gen: DETECTED — AB

## 2021-08-27 MED ORDER — CHLORHEXIDINE GLUCONATE CLOTH 2 % EX PADS
6.0000 | MEDICATED_PAD | Freq: Every day | CUTANEOUS | Status: DC
  Administered 2021-08-27 – 2021-09-08 (×11): 6 via TOPICAL

## 2021-08-27 MED ORDER — PROSOURCE TF PO LIQD
45.0000 mL | Freq: Two times a day (BID) | ORAL | Status: DC
  Administered 2021-08-27 – 2021-08-28 (×2): 45 mL
  Filled 2021-08-27 (×3): qty 45

## 2021-08-27 MED ORDER — ADULT MULTIVITAMIN W/MINERALS CH
1.0000 | ORAL_TABLET | Freq: Every day | ORAL | Status: DC
  Administered 2021-08-28 – 2021-09-08 (×12): 1 via ORAL
  Filled 2021-08-27 (×12): qty 1

## 2021-08-27 MED ORDER — VANCOMYCIN HCL IN DEXTROSE 1-5 GM/200ML-% IV SOLN
1000.0000 mg | Freq: Two times a day (BID) | INTRAVENOUS | Status: DC
  Administered 2021-08-27 – 2021-08-28 (×3): 1000 mg via INTRAVENOUS
  Filled 2021-08-27 (×4): qty 200

## 2021-08-27 MED ORDER — TRAMADOL HCL 50 MG PO TABS
50.0000 mg | ORAL_TABLET | Freq: Once | ORAL | Status: AC
  Administered 2021-08-27: 50 mg via ORAL
  Filled 2021-08-27: qty 1

## 2021-08-27 MED ORDER — BOOST / RESOURCE BREEZE PO LIQD CUSTOM
1.0000 | Freq: Three times a day (TID) | ORAL | Status: DC
  Administered 2021-08-27 (×2): 1 via ORAL

## 2021-08-27 MED ORDER — METHOCARBAMOL 500 MG PO TABS
500.0000 mg | ORAL_TABLET | Freq: Once | ORAL | Status: AC
  Administered 2021-08-27: 500 mg via ORAL
  Filled 2021-08-27: qty 1

## 2021-08-27 MED ORDER — SALINE SPRAY 0.65 % NA SOLN
1.0000 | NASAL | Status: DC | PRN
  Administered 2021-08-27: 1 via NASAL
  Filled 2021-08-27 (×3): qty 44

## 2021-08-27 NOTE — Progress Notes (Signed)
Initial Nutrition Assessment ? ?DOCUMENTATION CODES:  ? ?Not applicable ? ?INTERVENTION:  ? ?Boost Breeze po TID, each supplement provides 250 kcal and 9 grams of protein ? ?Pro-Source Plus 21ml PO BID- Each supplement provides 100kcal and 15g protein  ? ?MVI po daily  ? ?Liberalize diet  ? ?Pt at high refeed risk; recommend monitor potassium, magnesium and phosphorus labs daily until stable ? ?NUTRITION DIAGNOSIS:  ? ?Increased nutrient needs related to catabolic illness (COPD) as evidenced by estimated needs. ? ?GOAL:  ? ?Patient will meet greater than or equal to 90% of their needs ? ?MONITOR:  ? ?PO intake, Supplement acceptance, Labs, Weight trends, Skin, I & O's ? ?REASON FOR ASSESSMENT:  ? ?Consult ?Assessment of nutrition requirement/status ? ?ASSESSMENT:  ? ?71 y/o male with h/o CHF, COPD, HTN, GERD, depression, pulmonary nodule and malnutrition who is admitted with CAP and sepsis. ? ?RD working remotely. ? ?Unable to reach pt by phone. Pt is familiar to the nutrition department from his recent previous admission. Pt with fair appetite and oral intake at baseline. Pt drinking Boost Breeze and ProSource Plus during his last admission; pt does not like Ensure. Per chart, pt is down 20lbs(18%) over the past 10 months; this is significant weight loss. Pt diagnosed with severe malnutrition during his last admission and likely still meets criteria but unable to diagnose at this time as NFPE cannot be performed. RD will add supplements and MVI to help pt meet his estimated needs. RD will also liberalize pt's diet. Pt is likely at refeed risk. RD will obtain NFPE and history at follow up.  ? ?Medications reviewed and include: protonix, NaCl tabs, vancomycin  ? ?Labs reviewed: Na 130(L), K 4.0 wnl, creat 0.39(L) ?Hgb 10.7(L), Hct 33.6(L) ? ?NUTRITION - FOCUSED PHYSICAL EXAM: ?Unable to perform at this time  ? ?Diet Order:   ?Diet Order   ? ?       ?  Diet 2 gram sodium Room service appropriate? Yes; Fluid  consistency: Thin  Diet effective now       ?  ? ?  ?  ? ?  ? ?EDUCATION NEEDS:  ? ?No education needs have been identified at this time ? ?Skin:  Skin Assessment: Reviewed RN Assessment (Stage II coccyx- documented last admission) ? ?Last BM:  4/21 ? ?Height:  ? ?Ht Readings from Last 1 Encounters:  ?08/26/21 6' (1.829 m)  ? ? ?Weight:  ? ?Wt Readings from Last 1 Encounters:  ?08/27/21 63.6 kg  ? ? ?Ideal Body Weight:  80.9 kg ? ?BMI:  Body mass index is 19.02 kg/m?. ? ?Estimated Nutritional Needs:  ? ?Kcal:  1900-2200kcal/day ? ?Protein:  95-110g/day ? ?Fluid:  1.6-1.9L/day ? ?Koleen Distance MS, RD, LDN ?Please refer to AMION for RD and/or RD on-call/weekend/after hours pager ? ?

## 2021-08-27 NOTE — Progress Notes (Addendum)
?PROGRESS NOTE ? ? ? ?Jeremiah [name]  LAG:[id number] DOB: Nov 30, 1950 DOA: 08/26/2021 ?PCP: [place]  ? ? ?Assessment & Plan: ?  ?Principal Problem: ?  Acute on chronic respiratory failure with hypoxia ([place]) ?Active Problems: ?  Tobacco dependency ?  COPD, moderate ([place]) ?  Hyponatremia ?  HTN (hypertension) ?  Anemia ?  GERD (gastroesophageal reflux disease) ?  Malnutrition of moderate degree ?  Unintentional weight loss ?  Protein-calorie malnutrition, severe ?  Chronic hypoxemic respiratory failure (HCC) ?  Left lower lobe pulmonary nodule ?  Depression ?  Sepsis due to pneumonia [facility]) ?  Pleural effusion on right ?  Severe sepsis ([place]) ? ?Assessment and Plan: ?Acute on chronic hypoxic respiratory failure: likely secondary to pneumonia & pleural effusion. Continue on supplemental oxygen and wean back to baseline as tolerated. Chronically on 4-5L [place]  ?  ?Pleural effusion on right: w/ partial loculation. Thoracentesis w/ fluid studies ordered.  ?  ?Sepsis: met criteria w/ tachycardia, tachypnea, & likely pneumonia. Continue on IV vanco, levaquin. Procal <0.10  ? ?Pericardial effusion: small as per CTA chest. Echo ordered.  ?  ?Depression: severity unknown. Continue on duloxetine  ?  ?Left lower lobe pulmonary nodule: on CTA of chest on 08/26/2021, described as spiculated, 12 x 6 mm. Will need outpatient f/u and pt is aware  ? ?Severe protein-calorie malnutrition: dietary consulted  ? ?Unintentional weight loss: etiology unclear.  ? ?Thrombocytosis: etiology unclear. Will continue to monitor  ? ?Leukocytosis: likely secondary to above infection. Continue on IV abxs  ? ?Normocytic anemia: H&H are labile. No need for a transfusion currently  ? ?GERD: continue on PPI  ?  ?HTN: continue on lisinopril, diltiazem  ?  ?Hyponatremia: labile  ?  ?COPD exacerbation: Continue on abxs, bronchodilators & encourage incentive spirometry  ?  ?Tobacco dependency: smoking cessation counseling.  ? ? ? ? ? ? ?DVT  prophylaxis: heparin  ?Code Status:  full  ?Family Communication:  ?Disposition Plan: depends on PT/OT recs  ? ?Level of care: Stepdown ? ?Status is: Inpatient ?Remains inpatient appropriate because: needs a thoracentesis & on IV abxs  ? ? ? ?Consultants:  ? ? ?Procedures:  ? ?Antimicrobials: levaquin, vano  ? ? ?Subjective: ?Pt c/o shortness of breath  ? ?Objective: ?Vitals:  ? 08/26/21 2100 08/27/21 0023 08/27/21 0100 08/27/21 0330  ?BP: 126/85 125/67    ?Pulse: (!) 103 (!) 105  77  ?Resp: 20 (!) 21  (!) 24  ?Temp:  98.3 ?F (36.8 ?C) 98.2 ?F (36.8 ?C)   ?TempSrc:  Oral Oral   ?SpO2: 97% 100%  97%  ?Weight:   63.6 kg   ?Height:      ? ?No intake or output data in the 24 hours ending 08/27/21 0804 ?Filed Weights  ? 08/26/21 1727 08/27/21 0100  ?Weight: 72.6 kg 63.6 kg  ? ? ?Examination: ? ?General exam: Appears calm but uncomfortable. Cachetic appearing  ?Respiratory system: course breath sounds b/l  ?Cardiovascular system: S1 & S2 +. No rubs, gallops or clicks.  ?Gastrointestinal system: Abdomen is nondistended, soft and nontender. Normal bowel sounds heard. ?Central nervous system: Alert and oriented. Moves all extremities  ?Psychiatry: Judgement and insight appears at baseline. Flat mood and affect.  ? ? ? ?Data Reviewed: I have personally reviewed following labs and imaging studies ? ?CBC: ?Recent Labs  ?Lab 08/26/21 ?1224  ?WBC 15.4*  ?NEUTROABS 12.6*  ?HGB 12.0*  ?HCT 38.5*  ?MCV 87.5  ?PLT 462*  ? ?  Basic Metabolic Panel: ?Recent Labs  ?Lab 08/26/21 ?1733 08/27/21 ?1916  ?NA 131*  --   ?K 3.8  --   ?CL 86*  --   ?CO2 33*  --   ?GLUCOSE 73  --   ?BUN 14  --   ?CREATININE 0.48* 0.49*  ?CALCIUM 8.8*  --   ? ?GFR: ?Estimated Creatinine Clearance: 77.3 mL/min (A) (by C-G formula based on SCr of 0.49 mg/dL (L)). ?Liver Function Tests: ?No results for input(s): AST, ALT, ALKPHOS, BILITOT, PROT, ALBUMIN in the last 168 hours. ?No results for input(s): LIPASE, AMYLASE in the last 168 hours. ?No results for input(s):  AMMONIA in the last 168 hours. ?Coagulation Profile: ?No results for input(s): INR, PROTIME in the last 168 hours. ?Cardiac Enzymes: ?No results for input(s): CKTOTAL, CKMB, CKMBINDEX, TROPONINI in the last 168 hours. ?BNP (last 3 results) ?No results for input(s): PROBNP in the last 8760 hours. ?HbA1C: ?No results for input(s): HGBA1C in the last 72 hours. ?CBG: ?No results for input(s): GLUCAP in the last 168 hours. ?Lipid Profile: ?No results for input(s): CHOL, HDL, LDLCALC, TRIG, CHOLHDL, LDLDIRECT in the last 72 hours. ?Thyroid Function Tests: ?No results for input(s): TSH, T4TOTAL, FREET4, T3FREE, THYROIDAB in the last 72 hours. ?Anemia Panel: ?No results for input(s): VITAMINB12, FOLATE, FERRITIN, TIBC, IRON, RETICCTPCT in the last 72 hours. ?Sepsis Labs: ?Recent Labs  ?Lab 08/27/21 ?6060 08/27/21 ?0254  ?PROCALCITON <0.10  --   ?LATICACIDVEN  --  0.9  ? ? ?Recent Results (from the past 240 hour(s))  ?Resp Panel by RT-PCR (Flu A&B, Covid) Nasopharyngeal Swab     Status: None  ? Collection Time: 08/26/21  5:33 PM  ? Specimen: Nasopharyngeal Swab; Nasopharyngeal(NP) swabs in vial transport medium  ?Result Value Ref Range Status  ? SARS Coronavirus 2 by RT PCR NEGATIVE NEGATIVE Final  ?  Comment: (NOTE) ?SARS-CoV-2 target nucleic acids are NOT DETECTED. ? ?The SARS-CoV-2 RNA is generally detectable in upper respiratory ?specimens during the acute phase of infection. The lowest ?concentration of SARS-CoV-2 viral copies this assay can detect is ?138 copies/mL. A negative result does not preclude SARS-Cov-2 ?infection and should not be used as the sole basis for treatment or ?other patient management decisions. A negative result may occur with  ?improper specimen collection/handling, submission of specimen other ?than nasopharyngeal swab, presence of viral mutation(s) within the ?areas targeted by this assay, and inadequate number of viral ?copies(<138 copies/mL). A negative result must be combined with ?clinical  observations, patient history, and epidemiological ?information. The expected result is Negative. ? ?Fact Sheet for Patients:  ?EntrepreneurPulse.com.au ? ?Fact Sheet for Healthcare Providers:  ?IncredibleEmployment.be ? ?This test is no t yet approved or cleared by the [place] FDA and  ?has been authorized for detection and/or diagnosis of SARS-CoV-2 by ?FDA under an Emergency Use Authorization (EUA). This EUA will remain  ?in effect (meaning this test can be used) for the duration of the ?COVID-19 declaration under Section 564(b)(1) of the Act, 21 ?U.S.C.section 360bbb-3(b)(1), unless the authorization is terminated  ?or revoked sooner.  ? ? ?  ? Influenza A by PCR NEGATIVE NEGATIVE Final  ? Influenza B by PCR NEGATIVE NEGATIVE Final  ?  Comment: (NOTE) ?The Xpert Xpress SARS-CoV-2/FLU/RSV plus assay is intended as an aid ?in the diagnosis of influenza from Nasopharyngeal swab specimens and ?should not be used as a sole basis for treatment. Nasal washings and ?aspirates are unacceptable for Xpert Xpress SARS-CoV-2/FLU/RSV ?testing. ? ?Fact Sheet for Patients: ?EntrepreneurPulse.com.au ? ?  Fact Sheet for Healthcare Providers: ?IncredibleEmployment.be ? ?This test is not yet approved or cleared by the [place] FDA and ?has been authorized for detection and/or diagnosis of SARS-CoV-2 by ?FDA under an Emergency Use Authorization (EUA). This EUA will remain ?in effect (meaning this test can be used) for the duration of the ?COVID-19 declaration under Section 564(b)(1) of the Act, 21 U.S.C. ?section 360bbb-3(b)(1), unless the authorization is terminated or ?revoked. ? ?Performed at [facility], [place], ?[place] [address] ?  ?Culture, blood (Routine X 2) w Reflex to ID Panel     Status: None (Preliminary result)  ? Collection Time: 08/27/21 12:27 AM  ? Specimen: BLOOD  ?Result Value Ref Range Status  ? Specimen  Description BLOOD RIGHT ANTECUBITAL  Final  ? Special Requests   Final  ?  BOTTLES DRAWN AEROBIC AND ANAEROBIC Blood Culture results may not be optimal due to an inadequate volume of blood received in cult

## 2021-08-27 NOTE — Consult Note (Signed)
Pharmacy Antibiotic Note ? ?Jeremiah [name] is a 71 y.o. male admitted on 08/26/2021 with sepsis.  Pharmacy has been consulted for Cefepime and Vancomycin dosing. ? ?Patient with documented penicillin allergy but has tolerated multiple doses of ceftriaxone and previously tolerated cefepime as well.  ? ?Plan: ?Give cefepime 2g IV every 8 hours ?Give vancomycin 1.5g IV once followed by vancomycin 1g IV every 12 hours ?Continue to monitor renal function daily with AM labs and adjust dose accordingly ?Goal AUC 400-550  ?Est AUC: 462.8 ?Est Cmax:30.4 ?Est Cmin:12.2 ?Calculated with SCr 0.8 ? ?Pt being transition from Cefepime to Aztreonam for Loculated effusion and Levaquin for CAP. ? ?Per indication and renal fxn ordered: ?Levaquin 750 mg q24hr ?Aztreonam 2 gm q8hr ? ?Pharmacy will continue to follow and will adjust abx dosing as warranted. ? ?Height: 6' (182.9 cm) ?Weight: 72.6 kg (160 lb) ?IBW/kg (Calculated) : 77.6 ? ?Temp (24hrs), Avg:97.8 ?F (36.6 ?C), Min:97.3 ?F (36.3 ?C), Max:98.3 ?F (36.8 ?C) ? ?Recent Labs  ?Lab 08/26/21 ?0037  ?WBC 15.4*  ?CREATININE 0.48*  ? ?  ?Estimated Creatinine Clearance: 88.2 mL/min (A) (by C-G formula based on SCr of 0.48 mg/dL (L)).   ? ?Allergies  ?Allergen Reactions  ? Penicillins Anaphylaxis, Swelling and Other (See Comments)  ?  Tolerated Ceftriaxone 06/2020 ?05/2020 childhood reaction and his mother told him that he "swelled up." ? ?Had a PCN reaction causing immediate rash, facial/tongue/throat swelling, SOB or lightheadedness with hypotension: Yes ?Had a PCN reaction causing severe rash involving mucus membranes or skin necrosis: No ?Had a PCN reaction that required hospitalization No ?Had a PCN reaction occurring within the last 10 years: No ?If all the above answers are "NO", may proceed with Cephalosporin use.  ? ? ?Antimicrobials this admission: ?Cefepime  >> D/C by provider prior to 1st dose ?Vancomycin >>  ?Levaquin >> ?Aztreonam >> ? ?Dose adjustments this  admission: ? ?Microbiology results: ?4/19 BCx:  ? ?Thank you for allowing pharmacy to be a part of this pleasant patient?s care. ? ?Renda Rolls, PharmD, MBA ?08/27/2021 ?2:01 AM ? ? ? ?

## 2021-08-27 NOTE — Assessment & Plan Note (Signed)
-   Increased respiration rate, heart rate, with respiratory failure, and source of pneumonia ?- Blood cultures, lactic acid, broad-spectrum antibiotic ?- Message nursing staff to obtain blood cultures x2 and lactic acid ?- No septic shock as patient has been gaining appropriate MAP ?

## 2021-08-28 ENCOUNTER — Inpatient Hospital Stay
Admit: 2021-08-28 | Discharge: 2021-08-28 | Disposition: A | Discharge status: Home / Self Care | Payer: Medicare HMO | Attending: Internal Medicine | Admitting: Internal Medicine

## 2021-08-28 DIAGNOSIS — J189 Pneumonia, unspecified organism: Secondary | ICD-10-CM | POA: Diagnosis not present

## 2021-08-28 DIAGNOSIS — J9621 Acute and chronic respiratory failure with hypoxia: Secondary | ICD-10-CM | POA: Diagnosis not present

## 2021-08-28 DIAGNOSIS — J90 Pleural effusion, not elsewhere classified: Secondary | ICD-10-CM | POA: Diagnosis not present

## 2021-08-28 LAB — BASIC METABOLIC PANEL
Anion gap: 6 (ref 5–15)
BUN: 19 mg/dL (ref 8–23)
CO2: 37 mmol/L — ABNORMAL HIGH (ref 22–32)
Calcium: 8.6 mg/dL — ABNORMAL LOW (ref 8.9–10.3)
Chloride: 90 mmol/L — ABNORMAL LOW (ref 98–111)
Creatinine, Ser: 0.39 mg/dL — ABNORMAL LOW (ref 0.61–1.24)
GFR, Estimated: >60 mL/min (ref >60)
Glucose, Bld: 103 mg/dL — ABNORMAL HIGH (ref 70–99)
Potassium: 4.1 mmol/L (ref 3.5–5.1)
Sodium: 133 mmol/L — ABNORMAL LOW (ref 135–145)

## 2021-08-28 LAB — CBC
HCT: 35.2 % — ABNORMAL LOW (ref 39.0–52.0)
Hemoglobin: 10.8 g/dL — ABNORMAL LOW (ref 13.0–17.0)
MCH: 27 pg (ref 26.0–34.0)
MCHC: 30.7 g/dL (ref 30.0–36.0)
MCV: 88 fL (ref 80.0–100.0)
Platelets: 405 10*3/uL — ABNORMAL HIGH (ref 150–400)
RBC: 4 MIL/uL — ABNORMAL LOW (ref 4.22–5.81)
RDW: 17 % — ABNORMAL HIGH (ref 11.5–15.5)
WBC: 15.8 10*3/uL — ABNORMAL HIGH (ref 4.0–10.5)
nRBC: 0 % (ref 0.0–0.2)

## 2021-08-28 LAB — ECHOCARDIOGRAM LIMITED
Height: 72 in
S' Lateral: 2.9 cm
Weight: 2243.4 oz

## 2021-08-28 LAB — VANCOMYCIN, PEAK: Vancomycin Pk: 34 ug/mL (ref 30–40)

## 2021-08-28 LAB — VANCOMYCIN, TROUGH: Vancomycin Tr: 21 ug/mL (ref 15–20)

## 2021-08-28 MED ORDER — FUROSEMIDE 10 MG/ML IJ SOLN
40.0000 mg | Freq: Two times a day (BID) | INTRAMUSCULAR | Status: DC
  Administered 2021-08-28 – 2021-08-30 (×4): 40 mg via INTRAVENOUS
  Filled 2021-08-28 (×4): qty 4

## 2021-08-28 MED ORDER — VANCOMYCIN HCL 750 MG/150ML IV SOLN
750.0000 mg | Freq: Two times a day (BID) | INTRAVENOUS | Status: DC
  Administered 2021-08-28 – 2021-08-29 (×3): 750 mg via INTRAVENOUS
  Filled 2021-08-28 (×4): qty 150

## 2021-08-28 MED ORDER — IPRATROPIUM-ALBUTEROL 0.5-2.5 (3) MG/3ML IN SOLN
3.0000 mL | RESPIRATORY_TRACT | Status: DC | PRN
  Administered 2021-08-28 – 2021-09-06 (×6): 3 mL via RESPIRATORY_TRACT
  Filled 2021-08-28 (×4): qty 3

## 2021-08-28 MED ORDER — TRAMADOL HCL 50 MG PO TABS
50.0000 mg | ORAL_TABLET | Freq: Once | ORAL | Status: AC
  Administered 2021-08-28: 50 mg via ORAL
  Filled 2021-08-28: qty 1

## 2021-08-28 NOTE — Progress Notes (Addendum)
?PROGRESS NOTE ? ? ? ?Jeremiah [name]  OIB:[id number] DOB: December 14, 1950 DOA: 08/26/2021 ?PCP: [place]  ? ? ?Assessment & Plan: ?  ?Principal Problem: ?  Acute on chronic respiratory failure with hypoxia ([place]) ?Active Problems: ?  Tobacco dependency ?  COPD, moderate ([place]) ?  Hyponatremia ?  HTN (hypertension) ?  Anemia ?  GERD (gastroesophageal reflux disease) ?  Malnutrition of moderate degree ?  Unintentional weight loss ?  Protein-calorie malnutrition, severe ?  Chronic hypoxemic respiratory failure (HCC) ?  Left lower lobe pulmonary nodule ?  Depression ?  Sepsis due to pneumonia [facility]) ?  Pleural effusion on right ?  Severe sepsis ([place]) ? ?Assessment and Plan: ?Acute on chronic hypoxic respiratory failure: likely secondary to pneumonia & pleural effusion. Increased oxygen demand & respiratory rate today. Desaturates w/ minimal movement. High risk for intubation & ventilation. Started on IV lasix. Continue on supplemental oxygen and wean back to baseline as tolerated, currently on 5L [facility]NC. Palliative care consulted  ? ?CAP: continue on IV levaquin, vanco, bronchodilators & encourage incentive spirometry. MRSA screen is positive  ?  ?Pleural effusion on right: w/ partial loculation. Thoracentesis w/ fluid studies ordered and hopefully will be done tomorrow. Started on IV lasix  ?  ?Sepsis: met criteria w/ tachycardia, tachypnea, & likely pneumonia. Continue on IV levaquin, vanco. Procal <0.10  ? ?Pericardial effusion: small as per CTA chest. Repeat echo shows no evidence of pericardial effusion, EF 70-75%, no regional wall motion abnormalities  ?  ?Depression: severity unknown. Continue on duloxetine  ?  ?Left lower lobe pulmonary nodule: on CTA of chest on 08/26/2021, described as spiculated, 12 x 6 mm. Will need outpatient f/u and pt is aware  ? ?Severe protein-calorie malnutrition: continue on nutritional supplements  ? ?Unintentional weight loss: etiology unclear.  ? ?Thrombocytosis:  etiology unclear. Will continue to monitor  ? ?Leukocytosis: likely secondary to above infection. Continue on IV abxs ? ?Normocytic anemia: H&H are stable   ? ?GERD: continue on PPI  ?  ?HTN: continue on diltiazem, lisinopril  ?  ?Hyponatremia: trending up today  ?  ?COPD exacerbation:. Continue on abxs, bronchodilators & encourage incentive spirometry  ?  ?Tobacco dependency: smoking cessation counseling  ? ? ? ? ? ? ?DVT prophylaxis: heparin  ?Code Status:  full. Discussed code status w/ pt on 4/22 and pt wanted to stay full code ?Family Communication: called pt's friend, [name], no answer & unable to leave a voicemail  ?Disposition Plan: depends on PT/OT recs  ? ?Level of care: Stepdown ? ?Status is: Inpatient ?Remains inpatient appropriate because: needs a thoracentesis & on IV abxs  ? ? ? ?Consultants:  ? ? ?Procedures:  ? ?Antimicrobials: levaquin, vano  ? ? ?Subjective: ?Pt c/o significant shortness of breath  ? ?Objective: ?Vitals:  ? 08/27/21 2200 08/27/21 2300 08/28/21 0000 08/28/21 0400  ?BP: 118/64 102/62 110/60 (!) 94/59  ?Pulse: 68 64 67 61  ?Resp: (!) 21 20 20  (!) 25  ?Temp:   98 ?F (36.7 ?C) 98.4 ?F (36.9 ?C)  ?TempSrc:   Oral Oral  ?SpO2:   94% 96%  ?Weight:      ?Height:      ? ? ?Intake/Output Summary (Last 24 hours) at 08/28/2021 0825 ?Last data filed at 08/28/2021 0600 ?Gross per 24 hour  ?Intake 1205.43 ml  ?Output 750 ml  ?Net 455.43 ml  ? ?Filed Weights  ? 08/26/21 1727 08/27/21 0100  ?Weight: 72.6 kg 63.6 kg  ? ? ?  Examination: ? ?General exam: Appears uncomfortable. Appear cachetic  ?Respiratory system: course breath sounds b/l  ?Cardiovascular system: S1/S2+. No rubs or gallops ?Gastrointestinal system: Abd is soft, NT, ND & hypoactive bowel sounds  ?Central nervous system: alert and oriented. Moves all extremities  ?Psychiatry: Judgement and insight appears poor. Flat mood and affect  ? ? ? ?Data Reviewed: I have personally reviewed following labs and imaging studies ? ?CBC: ?Recent Labs   ?Lab 08/26/21 ?1733 08/27/21 ?6811 08/28/21 ?0423  ?WBC 15.4* 7.0 15.8*  ?NEUTROABS 12.6*  --   --   ?HGB 12.0* 10.7* 10.8*  ?HCT 38.5* 33.6* 35.2*  ?MCV 87.5 85.3 88.0  ?PLT 462* 412* 405*  ? ?Basic Metabolic Panel: ?Recent Labs  ?Lab 08/26/21 ?1733 08/27/21 ?0254 08/27/21 ?5726 08/28/21 ?0423  ?NA 131*  --  130* 133*  ?K 3.8  --  4.0 4.1  ?CL 86*  --  87* 90*  ?CO2 33*  --  36* 37*  ?GLUCOSE 73  --  226* 103*  ?BUN 14  --  14 19  ?CREATININE 0.48* 0.49* 0.39* 0.39*  ?CALCIUM 8.8*  --  8.4* 8.6*  ? ?GFR: ?Estimated Creatinine Clearance: 77.3 mL/min (A) (by C-G formula based on SCr of 0.39 mg/dL (L)). ?Liver Function Tests: ?No results for input(s): AST, ALT, ALKPHOS, BILITOT, PROT, ALBUMIN in the last 168 hours. ?No results for input(s): LIPASE, AMYLASE in the last 168 hours. ?No results for input(s): AMMONIA in the last 168 hours. ?Coagulation Profile: ?No results for input(s): INR, PROTIME in the last 168 hours. ?Cardiac Enzymes: ?No results for input(s): CKTOTAL, CKMB, CKMBINDEX, TROPONINI in the last 168 hours. ?BNP (last 3 results) ?No results for input(s): PROBNP in the last 8760 hours. ?HbA1C: ?No results for input(s): HGBA1C in the last 72 hours. ?CBG: ?No results for input(s): GLUCAP in the last 168 hours. ?Lipid Profile: ?No results for input(s): CHOL, HDL, LDLCALC, TRIG, CHOLHDL, LDLDIRECT in the last 72 hours. ?Thyroid Function Tests: ?No results for input(s): TSH, T4TOTAL, FREET4, T3FREE, THYROIDAB in the last 72 hours. ?Anemia Panel: ?No results for input(s): VITAMINB12, FOLATE, FERRITIN, TIBC, IRON, RETICCTPCT in the last 72 hours. ?Sepsis Labs: ?Recent Labs  ?Lab 08/27/21 ?2035 08/27/21 ?0254  ?PROCALCITON <0.10  --   ?LATICACIDVEN  --  0.9  ? ? ?Recent Results (from the past 240 hour(s))  ?Resp Panel by RT-PCR (Flu A&B, Covid) Nasopharyngeal Swab     Status: None  ? Collection Time: 08/26/21  5:33 PM  ? Specimen: Nasopharyngeal Swab; Nasopharyngeal(NP) swabs in vial transport medium  ?Result Value  Ref Range Status  ? SARS Coronavirus 2 by RT PCR NEGATIVE NEGATIVE Final  ?  Comment: (NOTE) ?SARS-CoV-2 target nucleic acids are NOT DETECTED. ? ?The SARS-CoV-2 RNA is generally detectable in upper respiratory ?specimens during the acute phase of infection. The lowest ?concentration of SARS-CoV-2 viral copies this assay can detect is ?138 copies/mL. A negative result does not preclude SARS-Cov-2 ?infection and should not be used as the sole basis for treatment or ?other patient management decisions. A negative result may occur with  ?improper specimen collection/handling, submission of specimen other ?than nasopharyngeal swab, presence of viral mutation(s) within the ?areas targeted by this assay, and inadequate number of viral ?copies(<138 copies/mL). A negative result must be combined with ?clinical observations, patient history, and epidemiological ?information. The expected result is Negative. ? ?Fact Sheet for Patients:  ?EntrepreneurPulse.com.au ? ?Fact Sheet for Healthcare Providers:  ?IncredibleEmployment.be ? ?This test is no t yet approved or cleared  by the [place] and  ?has been authorized for detection and/or diagnosis of SARS-CoV-2 by ?FDA under an Emergency Use Authorization (EUA). This EUA will remain  ?in effect (meaning this test can be used) for the duration of the ?COVID-19 declaration under Section 564(b)(1) of the Act, 21 ?U.S.C.section 360bbb-3(b)(1), unless the authorization is terminated  ?or revoked sooner.  ? ? ?  ? Influenza A by PCR NEGATIVE NEGATIVE Final  ? Influenza B by PCR NEGATIVE NEGATIVE Final  ?  Comment: (NOTE) ?The Xpert Xpress SARS-CoV-2/FLU/RSV plus assay is intended as an aid ?in the diagnosis of influenza from Nasopharyngeal swab specimens and ?should not be used as a sole basis for treatment. Nasal washings and ?aspirates are unacceptable for Xpert Xpress SARS-CoV-2/FLU/RSV ?testing. ? ?Fact Sheet for  Patients: ?EntrepreneurPulse.com.au ? ?Fact Sheet for Healthcare Providers: ?IncredibleEmployment.be ? ?This test is not yet approved or cleared by the [place] and ?has been authorized fo

## 2021-08-28 NOTE — Consult Note (Signed)
Pharmacy Antibiotic Note ? ?Jeremiah [name] is a 71 y.o. male admitted on 08/26/2021 with sepsis.  Pharmacy has been consulted for Cefepime and Vancomycin dosing. ? ?Patient with documented penicillin allergy but has tolerated multiple doses of ceftriaxone and previously tolerated cefepime as well.  ? ?Plan: ?Continue levofloxacin 750 mg daily.  ? ?Pt received vancomycin 1500 mg x 1 loading dose followed by vancomycin 1000 mg BID. Pt's weight changed so we adjust vancomycin to 750 mg BID. Predicted AUC 477. Goal AUC 400-550. Will order vancomycin peak and trough after AM dose.  ? ?Height: 6' (182.9 cm) ?Weight: 63.6 kg (140 lb 3.4 oz) ?IBW/kg (Calculated) : 77.6 ? ?Temp (24hrs), Avg:98.3 ?F (36.8 ?C), Min:98 ?F (36.7 ?C), Max:98.4 ?F (36.9 ?C) ? ?Recent Labs  ?Lab 08/26/21 ?1733 08/27/21 ?0254 08/27/21 ?1740 08/28/21 ?0423  ?WBC 15.4*  --  7.0 15.8*  ?CREATININE 0.48* 0.49* 0.39* 0.39*  ?LATICACIDVEN  --  0.9  --   --   ? ?  ?Estimated Creatinine Clearance: 77.3 mL/min (A) (by C-G formula based on SCr of 0.39 mg/dL (L)).   ? ?Allergies  ?Allergen Reactions  ? Penicillins Anaphylaxis, Swelling and Other (See Comments)  ?  Tolerated Ceftriaxone 06/2020 ?05/2020 childhood reaction and his mother told him that he "swelled up." ? ?Had a PCN reaction causing immediate rash, facial/tongue/throat swelling, SOB or lightheadedness with hypotension: Yes ?Had a PCN reaction causing severe rash involving mucus membranes or skin necrosis: No ?Had a PCN reaction that required hospitalization No ?Had a PCN reaction occurring within the last 10 years: No ?If all the above answers are "NO", may proceed with Cephalosporin use.  ? ? ?Antimicrobials this admission: ?Cefepime  >> D/C by provider prior to 1st dose ?Vancomycin >>  ?Levaquin >> ?Aztreonam >> 4/22 ? ?Dose adjustments this admission: ? ?Microbiology results: ?4/19 BCx:  ? ?Thank you for allowing pharmacy to be a part of this pleasant patient?s care. ? ?Eleonore Chiquito,  PharmD ?08/28/2021 ?10:37 AM ? ? ? ?

## 2021-08-28 NOTE — Progress Notes (Signed)
*  PRELIMINARY RESULTS* ?Echocardiogram ?2D Echocardiogram has been performed. ? ?[place], [place] ?08/28/2021, 10:33 AM ?

## 2021-08-28 NOTE — Progress Notes (Signed)
Pharmacy Antibiotic Note ? ?Jeremiah [name] is a 71 y.o. male admitted on 08/26/2021 with sepsis.  Pharmacy has been consulted for Cefepime and Vancomycin dosing. ? ?Patient with documented penicillin allergy but has tolerated multiple doses of ceftriaxone and previously tolerated cefepime as well.  ? ?Plan: ?Continue levofloxacin 750 mg daily.  ? ?2. Vancomycin 1500 mg x 1 loading dose followed by vancomycin 1000 mg BID ?Vanc peak 34 ?Vanc tr 21 ?Cmax 35.3 ?Cmin 20.1 ?Calculated AUC at steady state 650 ? ?*New recommended regimen Vancomycin 750mg  IV q 12hours ?Calculated AUC: 487.4 ?Cmax 26.5 ?Cmin 15.1 ? ?Height: 6' (182.9 cm) ?Weight: 63.6 kg (140 lb 3.4 oz) ?IBW/kg (Calculated) : 77.6 ? ?Temp (24hrs), Avg:98.2 ?F (36.8 ?C), Min:98 ?F (36.7 ?C), Max:98.4 ?F (36.9 ?C) ? ?Recent Labs  ?Lab 08/26/21 ?1733 08/27/21 ?0254 08/27/21 ?6073 08/28/21 ?0423 08/28/21 ?1123 08/28/21 ?2048  ?WBC 15.4*  --  7.0 15.8*  --   --   ?CREATININE 0.48* 0.49* 0.39* 0.39*  --   --   ?LATICACIDVEN  --  0.9  --   --   --   --   ?[place]  --   --   --   --   --  21*  ?VANCOPEAK  --   --   --   --  44  --   ? ?  ?Estimated Creatinine Clearance: 77.3 mL/min (A) (by C-G formula based on SCr of 0.39 mg/dL (L)).   ? ?Allergies  ?Allergen Reactions  ? Penicillins Anaphylaxis, Swelling and Other (See Comments)  ?  Tolerated Ceftriaxone 06/2020 ?05/2020 childhood reaction and his mother told him that he "swelled up." ? ?Had a PCN reaction causing immediate rash, facial/tongue/throat swelling, SOB or lightheadedness with hypotension: Yes ?Had a PCN reaction causing severe rash involving mucus membranes or skin necrosis: No ?Had a PCN reaction that required hospitalization No ?Had a PCN reaction occurring within the last 10 years: No ?If all the above answers are "NO", may proceed with Cephalosporin use.  ? ? ?Antimicrobials this admission: ?Cefepime  >> D/C by provider prior to 1st dose ?Vancomycin >>  ?Levaquin >> ?Aztreonam >> 4/22 ? ?Dose  adjustments this admission: ? ?Microbiology results: ?4/19 BCx:  ? ?Thank you for allowing pharmacy to be a part of this pleasant patient?s care. ? ?Nevin Grizzle Rodriguez-Guzman PharmD, BCPS ?08/28/2021 10:00 PM ? ? ? ? ?

## 2021-08-29 ENCOUNTER — Inpatient Hospital Stay: Payer: Medicare HMO | Admitting: Radiology

## 2021-08-29 ENCOUNTER — Inpatient Hospital Stay: Payer: Medicare HMO

## 2021-08-29 DIAGNOSIS — J9621 Acute and chronic respiratory failure with hypoxia: Secondary | ICD-10-CM | POA: Diagnosis not present

## 2021-08-29 DIAGNOSIS — J90 Pleural effusion, not elsewhere classified: Secondary | ICD-10-CM | POA: Diagnosis not present

## 2021-08-29 DIAGNOSIS — J189 Pneumonia, unspecified organism: Secondary | ICD-10-CM | POA: Diagnosis not present

## 2021-08-29 HISTORY — PX: IR THORACENTESIS ASP PLEURAL SPACE W/IMG GUIDE: IMG5380

## 2021-08-29 LAB — BODY FLUID CELL COUNT WITH DIFFERENTIAL
Eos, Fluid: 0 %
Lymphs, Fluid: 63 %
Monocyte-Macrophage-Serous Fluid: 33 %
Neutrophil Count, Fluid: 4 %
Total Nucleated Cell Count, Fluid: 2975 cu mm

## 2021-08-29 LAB — BASIC METABOLIC PANEL
Anion gap: 7 (ref 5–15)
BUN: 19 mg/dL (ref 8–23)
CO2: 33 mmol/L — ABNORMAL HIGH (ref 22–32)
Calcium: 8.5 mg/dL — ABNORMAL LOW (ref 8.9–10.3)
Chloride: 91 mmol/L — ABNORMAL LOW (ref 98–111)
Creatinine, Ser: 0.6 mg/dL — ABNORMAL LOW (ref 0.61–1.24)
GFR, Estimated: >60 mL/min (ref >60)
Glucose, Bld: 103 mg/dL — ABNORMAL HIGH (ref 70–99)
Potassium: 4.3 mmol/L (ref 3.5–5.1)
Sodium: 131 mmol/L — ABNORMAL LOW (ref 135–145)

## 2021-08-29 LAB — LACTATE DEHYDROGENASE, PLEURAL OR PERITONEAL FLUID: LD, Fluid: 457 U/L — ABNORMAL HIGH (ref 3–23)

## 2021-08-29 LAB — CBC
HCT: 37 % — ABNORMAL LOW (ref 39.0–52.0)
Hemoglobin: 11.2 g/dL — ABNORMAL LOW (ref 13.0–17.0)
MCH: 27.3 pg (ref 26.0–34.0)
MCHC: 30.3 g/dL (ref 30.0–36.0)
MCV: 90.2 fL (ref 80.0–100.0)
Platelets: 398 10*3/uL (ref 150–400)
RBC: 4.1 MIL/uL — ABNORMAL LOW (ref 4.22–5.81)
RDW: 17.1 % — ABNORMAL HIGH (ref 11.5–15.5)
WBC: 13.7 10*3/uL — ABNORMAL HIGH (ref 4.0–10.5)
nRBC: 0 % (ref 0.0–0.2)

## 2021-08-29 LAB — PROTEIN, PLEURAL OR PERITONEAL FLUID: Total protein, fluid: 3.7 g/dL

## 2021-08-29 LAB — GLUCOSE, CAPILLARY: Glucose-Capillary: 94 mg/dL (ref 70–99)

## 2021-08-29 MED ORDER — LIDOCAINE HCL 1 % IJ SOLN
INTRAMUSCULAR | Status: AC
  Administered 2021-08-29: 20 mL
  Filled 2021-08-29: qty 20

## 2021-08-29 MED ORDER — ALBUTEROL SULFATE (2.5 MG/3ML) 0.083% IN NEBU
2.5000 mg | INHALATION_SOLUTION | Freq: Four times a day (QID) | RESPIRATORY_TRACT | Status: DC
  Administered 2021-08-29 – 2021-08-31 (×6): 2.5 mg via RESPIRATORY_TRACT
  Filled 2021-08-29 (×7): qty 3

## 2021-08-29 MED ORDER — ENSURE ENLIVE PO LIQD
237.0000 mL | Freq: Three times a day (TID) | ORAL | Status: DC
  Administered 2021-08-29 – 2021-09-08 (×20): 237 mL via ORAL

## 2021-08-29 MED ORDER — TRAMADOL HCL 50 MG PO TABS
50.0000 mg | ORAL_TABLET | Freq: Four times a day (QID) | ORAL | Status: DC | PRN
  Administered 2021-08-29 – 2021-09-01 (×8): 50 mg via ORAL
  Filled 2021-08-29 (×8): qty 1

## 2021-08-29 MED ORDER — BUDESONIDE 0.5 MG/2ML IN SUSP
0.5000 mg | Freq: Two times a day (BID) | RESPIRATORY_TRACT | Status: DC
  Administered 2021-08-29 – 2021-09-08 (×19): 0.5 mg via RESPIRATORY_TRACT
  Filled 2021-08-29 (×19): qty 2

## 2021-08-29 MED ORDER — MIDODRINE HCL 5 MG PO TABS
10.0000 mg | ORAL_TABLET | Freq: Three times a day (TID) | ORAL | Status: DC
  Administered 2021-08-29 – 2021-09-01 (×11): 10 mg via ORAL
  Filled 2021-08-29 (×12): qty 2

## 2021-08-29 MED ORDER — KETOROLAC TROMETHAMINE 15 MG/ML IJ SOLN: 15.0000 mg | Freq: Three times a day (TID) | INTRAMUSCULAR | Status: DC | PRN

## 2021-08-29 MED ORDER — METHYLPREDNISOLONE SODIUM SUCC 40 MG IJ SOLR
40.0000 mg | Freq: Every day | INTRAMUSCULAR | Status: DC
  Administered 2021-08-29 – 2021-08-31 (×3): 40 mg via INTRAVENOUS
  Filled 2021-08-29 (×3): qty 1

## 2021-08-29 NOTE — Progress Notes (Signed)
?PROGRESS NOTE ? ? ? ?Jeremiah [name]  AUQ:[id number] DOB: [birth year] DOA: 08/26/2021 ?PCP: [facility]  ? ? ?Assessment & Plan: ?  ?Principal Problem: ?  Acute on chronic respiratory failure with hypoxia ([place]) ?Active Problems: ?  Tobacco dependency ?  COPD, moderate ([place]) ?  Hyponatremia ?  HTN (hypertension) ?  Anemia ?  GERD (gastroesophageal reflux disease) ?  Malnutrition of moderate degree ?  Unintentional weight loss ?  Protein-calorie malnutrition, severe ?  Chronic hypoxemic respiratory failure (HCC) ?  Left lower lobe pulmonary nodule ?  Depression ?  Sepsis due to pneumonia [facility]) ?  Pleural effusion on right ?  Severe sepsis ([place]) ? ?Assessment and Plan: ?Acute on chronic hypoxic respiratory failure: likely secondary to pneumonia & pleural effusion. Still w/ shortness of breath today & use of accessory muscles. High risk of intubation & ventilation. Continue on IV lasix. Continue on supplemental oxygen and wean back to baseline as tolerated. Palliative care is consulted. Pulmon consulted  ? ?CAP:  still very short of breath w/ use of accessory muscles. Continue on IV levaquin, vanco, bronchodilators & encourage incentive spirometry. MRSA screen is positive  ?  ?Pleural effusion on right: w/ partial loculation. Still very short of breath & using accessory muscles. Continue on IV lasix. Will go for thoracentesis today and fluid studies ordered.  ?  ?Sepsis: met criteria w/ tachycardia, tachypnea, & likely pneumonia. Continue on IV levaquin, vanco. Procal <0.10  ? ?Unlikely pericardial effusion: small as per CTA chest. Repeat echo shows no evidence of pericardial effusion, EF 70-75%, no regional wall motion abnormalities  ?  ?Depression: severity unknown. Continue on duloxetine  ?  ?Left lower lobe pulmonary nodule: on CTA of chest on 08/26/2021, described as spiculated, 12 x 6 mm. Will need outpatient f/u and pt is aware  ? ?Severe protein-calorie malnutrition: continue on nutritional  supplements ? ?Unintentional weight loss: etiology unclear.  ? ?Thrombocytosis: resolved  ? ?Leukocytosis: likely secondary to above infection. Continue on IV abxs  ? ?Normocytic anemia: H&H are labile. No need for a transfusion currently  ? ?GERD: continue on PPI   ?  ?HTN: continue on lisinopril, cardizem  ? ?Hyponatremia: labile. Will continue to monitor  ?  ?COPD exacerbation: Continue on bronchodilators, abxs & encourage incentive spirometry  ?  ?Tobacco dependency: received smoking cessation counseling   ? ? ? ? ? ? ?DVT prophylaxis: heparin  ?Code Status:  full. Discussed code status w/ pt on 4/24 and pt wanted to stay full code ?Family Communication: called pt's friend, [name], no answer & unable to leave a voicemail  ?Disposition Plan: depends on PT/OT recs  ? ?Level of care: Stepdown ? ?Status is: Inpatient ?Remains inpatient appropriate because: needs a thoracentesis & on IV abxs  ? ? ? ?Consultants:  ?Palliative care ?Pulmon  ? ?Procedures:  ? ?Antimicrobials: levaquin, vano  ? ? ?Subjective: ?Pt c/o shortness of breath, unchanged from day prior  ? ?Objective: ?Vitals:  ? 08/28/21 2200 08/29/21 0400 08/29/21 0700 08/29/21 0800  ?BP: 101/61 (!) 91/51 (!) 95/56 (!) 89/55  ?Pulse: 66 64 68 69  ?Resp: 20 20 18 15   ?Temp: (!) 97.1 ?F (36.2 ?C) (!) 97.4 ?F (36.3 ?C)  97.6 ?F (36.4 ?C)  ?TempSrc: Axillary Axillary  Oral  ?SpO2: 100% 99%  97%  ?Weight:      ?Height:      ? ? ?Intake/Output Summary (Last 24 hours) at 08/29/2021 0825 ?Last data filed at 08/29/2021 0800 ?Johney Maine  per 24 hour  ?Intake 200.06 ml  ?Output 1250 ml  ?Net -1049.94 ml  ? ?Filed Weights  ? 08/26/21 1727 08/27/21 0100  ?Weight: 72.6 kg 63.6 kg  ? ? ?Examination: ? ?General exam: Appears uncomfortable. Cachetic  ?Respiratory system: diminished breat sounds b/l. Accessory muscle use ?Cardiovascular system: S1 & S2+. No rubs or gallops ?Gastrointestinal system: Abd is soft, NT, ND & hypoactive bowel sounds  ?Central nervous system: alert and  awake. Moves all extremities  ?Psychiatry: Judgement and insight appears poor. Flat mood and affect   ? ? ? ?Data Reviewed: I have personally reviewed following labs and imaging studies ? ?CBC: ?Recent Labs  ?Lab 08/26/21 ?1733 08/27/21 ?6578 08/28/21 ?0423 08/29/21 ?0409  ?WBC 15.4* 7.0 15.8* 13.7*  ?NEUTROABS 12.6*  --   --   --   ?HGB 12.0* 10.7* 10.8* 11.2*  ?HCT 38.5* 33.6* 35.2* 37.0*  ?MCV 87.5 85.3 88.0 90.2  ?PLT 462* 412* 405* 398  ? ?Basic Metabolic Panel: ?Recent Labs  ?Lab 08/26/21 ?1733 08/27/21 ?0254 08/27/21 ?4696 08/28/21 ?0423 08/29/21 ?0409  ?NA 131*  --  130* 133* 131*  ?K 3.8  --  4.0 4.1 4.3  ?CL 86*  --  87* 90* 91*  ?CO2 33*  --  36* 37* 33*  ?GLUCOSE 73  --  226* 103* 103*  ?BUN 14  --  14 19 19   ?CREATININE 0.48* 0.49* 0.39* 0.39* 0.60*  ?CALCIUM 8.8*  --  8.4* 8.6* 8.5*  ? ?GFR: ?Estimated Creatinine Clearance: 77.3 mL/min (A) (by C-G formula based on SCr of 0.6 mg/dL (L)). ?Liver Function Tests: ?No results for input(s): AST, ALT, ALKPHOS, BILITOT, PROT, ALBUMIN in the last 168 hours. ?No results for input(s): LIPASE, AMYLASE in the last 168 hours. ?No results for input(s): AMMONIA in the last 168 hours. ?Coagulation Profile: ?No results for input(s): INR, PROTIME in the last 168 hours. ?Cardiac Enzymes: ?No results for input(s): CKTOTAL, CKMB, CKMBINDEX, TROPONINI in the last 168 hours. ?BNP (last 3 results) ?No results for input(s): PROBNP in the last 8760 hours. ?HbA1C: ?No results for input(s): HGBA1C in the last 72 hours. ?CBG: ?Recent Labs  ?Lab 08/27/21 ?2952  ?GLUCAP 94  ? ?Lipid Profile: ?No results for input(s): CHOL, HDL, LDLCALC, TRIG, CHOLHDL, LDLDIRECT in the last 72 hours. ?Thyroid Function Tests: ?No results for input(s): TSH, T4TOTAL, FREET4, T3FREE, THYROIDAB in the last 72 hours. ?Anemia Panel: ?No results for input(s): VITAMINB12, FOLATE, FERRITIN, TIBC, IRON, RETICCTPCT in the last 72 hours. ?Sepsis Labs: ?Recent Labs  ?Lab 08/27/21 ?8413 08/27/21 ?0254  ?PROCALCITON  <0.10  --   ?LATICACIDVEN  --  0.9  ? ? ?Recent Results (from the past 240 hour(s))  ?Resp Panel by RT-PCR (Flu A&B, Covid) Nasopharyngeal Swab     Status: None  ? Collection Time: 08/26/21  5:33 PM  ? Specimen: Nasopharyngeal Swab; Nasopharyngeal(NP) swabs in vial transport medium  ?Result Value Ref Range Status  ? SARS Coronavirus 2 by RT PCR NEGATIVE NEGATIVE Final  ?  Comment: (NOTE) ?SARS-CoV-2 target nucleic acids are NOT DETECTED. ? ?The SARS-CoV-2 RNA is generally detectable in upper respiratory ?specimens during the acute phase of infection. The lowest ?concentration of SARS-CoV-2 viral copies this assay can detect is ?138 copies/mL. A negative result does not preclude SARS-Cov-2 ?infection and should not be used as the sole basis for treatment or ?other patient management decisions. A negative result may occur with  ?improper specimen collection/handling, submission of specimen other ?than nasopharyngeal swab, presence of viral  mutation(s) within the ?areas targeted by this assay, and inadequate number of viral ?copies(<138 copies/mL). A negative result must be combined with ?clinical observations, patient history, and epidemiological ?information. The expected result is Negative. ? ?Fact Sheet for Patients:  ?EntrepreneurPulse.com.au ? ?Fact Sheet for Healthcare Providers:  ?IncredibleEmployment.be ? ?This test is no t yet approved or cleared by the [place] FDA and  ?has been authorized for detection and/or diagnosis of SARS-CoV-2 by ?FDA under an Emergency Use Authorization (EUA). This EUA will remain  ?in effect (meaning this test can be used) for the duration of the ?COVID-19 declaration under Section 564(b)(1) of the Act, 21 ?U.S.C.section 360bbb-3(b)(1), unless the authorization is terminated  ?or revoked sooner.  ? ? ?  ? Influenza A by PCR NEGATIVE NEGATIVE Final  ? Influenza B by PCR NEGATIVE NEGATIVE Final  ?  Comment: (NOTE) ?The Xpert Xpress  SARS-CoV-2/FLU/RSV plus assay is intended as an aid ?in the diagnosis of influenza from Nasopharyngeal swab specimens and ?should not be used as a sole basis for treatment. Nasal washings and ?aspirates are unacceptable

## 2021-08-29 NOTE — Procedures (Signed)
PROCEDURE SUMMARY: ? ?Successful US guided right thoracentesis. ?Yielded 1.2 L of clear yellow fluid. ?Pt tolerated procedure well. ?No immediate complications. ? ?Specimen was sent for labs. ?CXR ordered. ? ?EBL < 5 mL ? ?Tsosie Billing D PA-C ?08/29/2021 ?3:51 PM ? ? ? ?

## 2021-08-29 NOTE — Progress Notes (Signed)
Nutrition Follow Up Note  ? ?DOCUMENTATION CODES:  ? ?Severe malnutrition in context of chronic illness ? ?INTERVENTION:  ? ?Ensure Enlive po TID, each supplement provides 350 kcal and 20 grams of protein. ? ?Magic cup TID with meals, each supplement provides 290 kcal and 9 grams of protein ? ?MVI po daily  ? ?Liberalize diet  ? ?Pt at high refeed risk; recommend monitor potassium, magnesium and phosphorus labs daily until stable ? ?NUTRITION DIAGNOSIS:  ? ?Severe Malnutrition related to chronic illness (COPD, CHF) as evidenced by percent weight loss, moderate fat depletion, moderate muscle depletion, severe muscle depletion. ? ?GOAL:  ? ?Patient will meet greater than or equal to 90% of their needs ?-not met  ? ?MONITOR:  ? ?PO intake, Supplement acceptance, Labs, Weight trends, Skin, I & O's ? ?ASSESSMENT:  ? ?71 y/o male with h/o CHF, COPD, HTN, GERD, depression, pulmonary nodule and malnutrition who is admitted with CAP and sepsis. ? ?Met with pt in room today. Pt reports poor appetite and oral intake at baseline. Pt reports that his appetite and oral intake remain poor in hospital. Pt's breakfast tray is sitting on his side table with only bites taken from it; pt ate 75% of a bowel of grits. Pt had an Ensure sitting on his side table with only sips taken from is; pt reports that he is willing to drink the vanilla flavor only. RD discussed with pt the importance of adequate nutrition needed to preserve lean muscle. Per chart, pt is down ~20lbs(18%) over the past 10 months; this is significant weight loss. RD will add vanilla Ensure and Magic Cups. ProSource Plus discontinued by MD. Pt is likely at refeed risk.  ? ?Medications reviewed and include: lasix, MVI, protonix, NaCl tabs, vancomycin  ? ?Labs reviewed: Na 131(L), creat 0.60(L), K 4.3 wnl ?Wbc- 13.7(H) ? ?NUTRITION - FOCUSED PHYSICAL EXAM: ? ?Flowsheet Row Most Recent Value  ?Orbital Region Mild depletion  ?Upper Arm Region Moderate depletion  ?Thoracic  and Lumbar Region Moderate depletion  ?Buccal Region Mild depletion  ?Temple Region Moderate depletion  ?Clavicle Bone Region Mild depletion  ?Clavicle and Acromion Bone Region Mild depletion  ?Scapular Bone Region Moderate depletion  ?Dorsal Hand Mild depletion  ?Patellar Region Severe depletion  ?Anterior Thigh Region Severe depletion  ?Posterior Calf Region Severe depletion  ?Edema (RD Assessment) None  ?Hair Reviewed  ?Eyes Reviewed  ?Mouth Reviewed  ?Skin Reviewed  ?Nails Reviewed  ? ?Diet Order:   ?Diet Order   ? ?       ?  Diet 2 gram sodium Room service appropriate? Yes; Fluid consistency: Thin  Diet effective now       ?  ? ?  ?  ? ?  ? ?EDUCATION NEEDS:  ? ?Education needs have been addressed ? ?Skin:  Skin Assessment: Reviewed RN Assessment (Stage II coccyx- documented last admission) ? ?Last BM:  4/21 ? ?Height:  ? ?Ht Readings from Last 1 Encounters:  ?08/26/21 6' (1.829 m)  ? ? ?Weight:  ? ?Wt Readings from Last 1 Encounters:  ?08/27/21 63.6 kg  ? ? ?Ideal Body Weight:  80.9 kg ? ?BMI:  Body mass index is 19.02 kg/m?. ? ?Estimated Nutritional Needs:  ? ?Kcal:  1900-2200kcal/day ? ?Protein:  95-110g/day ? ?Fluid:  1.6-1.9L/day ? ?Koleen Distance MS, RD, LDN ?Please refer to [facility] for RD and/or RD on-call/weekend/after hours pager ? ?

## 2021-08-29 NOTE — Consult Note (Signed)
? ?NAME:  Jeremiah [name], MRN:  [id number], DOB:  [birth year], LOS: 3 ?ADMISSION DATE:  08/26/2021, CONSULTATION DATE:  08/29/2021 ?REFERRING MD:  Dr. Jimmye Norman, CHIEF COMPLAINT:  Acute on chronic Hypoxic respiratory failure  ? ?Brief Pt Description / Synopsis:  ?71 y.o. Male with PMH significant for COPD requiring 4L supplemental O2 at baseline admitted with Acute on Chronic Hypoxic Respiratory Failure in the setting of AECOPD, Community Acquired Pneumonia, large (partially loculated) Right Pleural Effusion.  Also found to have new LLL spiculated lung nodule. ? ?History of Present Illness:  ?Jeremiah [name] is a 71 year old male with a past medical history significant for COPD requiring 4 to 5 L supplemental O2 at home, tobacco abuse, hypertension, anxiety, depression, GERD who presented to [facility] ED on 08/26/2021 due to complaints of worsening shortness of breath for approximately 2 weeks. ? ?He also reported worsening cough that was productive of dark yellow sputum.  He also endorses that he is lost a significant mount of weight in the last 6 months, unable to provide a specific amount of pounds.  He denied fever, chills, nausea, vomiting, chest pain, abdominal pain, dysuria. ? ?ED Course: ?Initial Vital Signs: Temperature 97.3 ?F, respiratory rate 25, pulse 102, blood pressure 119/76, SPO2 98% on BiPAP ?Significant Labs: Serum sodium is 131, potassium 3.8, chloride 86, bicarb of 33, BUN of 14, serum creatinine of 0.48, GFR greater than 60, nonfasting blood glucose 73, WBC elevated at 15.4, hemoglobin 12, platelets of 462.  COVID/influenza A/influenza B PCR were negative ?Imaging ?Chest X-ray>>IMPRESSION: ?1. Interval increase of an at least small volume right pleural ?effusion. ?2. Aortic Atherosclerosis (ICD10-I70.0) and Emphysema  ?CTA Chest>>IMPRESSION: ?1. Marked severity emphysematous lung disease with marked severity ?right lower lobe consolidation. ?2. Large partially loculated right-sided pleural effusion. ?3.  Fracture deformities involving the superior endplates of the T8 ?and T11 vertebral bodies, new when compared to the prior exam. ?4. New 12 mm x 6 mm spiculated lung nodule versus area of focal ?scarring within the posterior aspect of the left lower lobe. ?Correlation with follow-up PET-CT or tissue sampling is recommended. ?This recommendation follows the consensus statement: Guidelines for ?Management of Incidental Pulmonary Nodules Detected on CT Images: ?From the Fleischner Society 2017; Radiology 2017; 284:228-243. ?5. Small, approximately 11 mm thick anterior pericardial effusion. ?6. Coronary artery disease. ?Medications Administered: DuoNeb x2, Solu-Medrol 125 mg IV one-time dose, aztreonam and vancomycin. ? ?He was admitted by the hospitalist for further work-up and treatment of acute on chronic hypoxic respiratory failure. ? ?Please see "Significant Hospital Events" section below for full detailed hospital course. ? ?Pertinent  Medical History  ? ?Past Medical History:  ?Diagnosis Date  ? Acute and chronic respiratory failure (acute-on-chronic) ([place]) 10/18/2020  ? Asthma   ? COPD (chronic obstructive pulmonary disease) ([place])   ? O2 dependent - 2L  ? GERD (gastroesophageal reflux disease)   ? HTN (hypertension)   ? Multifocal pneumonia 06/16/2020  ? Other emphysema ([place]) 08/22/2016  ? Oxygen dependent   ? Sepsis ([place]) 05/14/2020  ? Tobacco dependence   ? ? ? ?Micro Data:  ?4/21: SARS-CoV-2 and influenza PCR>> negative ?4/22: Blood culture x2>> no growth to date ?4/22: Legionella urinary antigen>> ?4/22: MRSA PCR>> positive ? ?Antimicrobials:  ?Cefepime 4/21 x 1 dose ?Aztreonam 4/21>> 4/22 ?Levofloxacin 4/21>> ?Vancomycin 4/21>> ? ?Significant Hospital Events: ?Including procedures, antibiotic start and stop dates in addition to other pertinent events   ?4/21: Admitted by the hospitalist for commune acquired pneumonia and COPD exacerbation ?4/23: Increased oxygen  demand, respiratory rate, desaturations with minimal  movement.  Was started on IV Lasix twice daily.  Palliative care consulted.  IR consulted for thoracentesis ?4/24: PCCM asked to see for pulmonary consult given accessory muscle use and high risk for decompensation.  Plan for thoracentesis by IR today. ? ?Interim History / Subjective:  ?-Patient with continued shortness of breath accessory muscle use, currently on 5 L supplemental O2 ?-Thoracentesis scheduled for today ?-Palliative care consult is pending for further goals of care discussion ?-PCCM asked to see for pulmonary consult ~upon examination patient resting comfortably, with mild accessory muscle use, able to speak in full sentences, diminished coarse breath sounds throughout ? ?Objective   ?Blood pressure (!) 103/49, pulse 65, temperature 97.7 ?F (36.5 ?C), temperature source Oral, resp. rate 17, height 6' (1.829 m), weight 63.6 kg, SpO2 97 %. ?   ?   ? ?Intake/Output Summary (Last 24 hours) at 08/29/2021 1456 ?Last data filed at 08/29/2021 1200 ?Gross per 24 hour  ?Intake --  ?Output 1550 ml  ?Net -1550 ml  ? ?Filed Weights  ? 08/26/21 1727 08/27/21 0100  ?Weight: 72.6 kg 63.6 kg  ? ? ?Examination: ?General: Acute on chronically ill-appearing, cachetic male, laying in bed, resting comfortably, in no acute distress ?HENT: Atraumatic, normocephalic, neck supple, no JVD ?Lungs: Diminished coarse breath sounds throughout, no wheezing, even, mild accessory muscle use ?Cardiovascular: Regular rate and rhythm, S1-S2, no murmurs, Smir gallops ?Abdomen: Soft, nontender, nondistended, no guarding rebound tenderness, bowel sounds positive x4 ?Extremities: Generalized weakness, no deformities, no cyanosis, trace edema bilateral lower extremities ?Neuro: Sleeping, arouses easily to voice, alert and oriented, very flat affect, moves all extremities to command, no focal deficits ?GU: External catheter in place ? ?Resolved Hospital Problem list   ? ? ?Assessment & Plan:  ? ?Acute on Chronic Hypoxic Respiratory Failure in  the setting of AECOPD, Community Acquired Pneumonia, large (partially loculated) Right Pleural Effusion ?New LLL spiculated lung nodule ?-Supplemental O2 as needed to maintain O2 sats 88 to 92% ?-BiPAP if needed ?-Follow intermittent Chest X-ray & ABG as needed ?-Bronchodilators & Pulmicort nebs ?-IV Steroids (solumedrol 40 mg started 4/24) ?-ABX as above ?-Diuresis as BP and renal function permits ~ currently on Lasix 40 mg BID ?-Pulmonary toilet as able ?-IR consulted for Thoracentesis on 4/24 ?-Will need outpatient work up of nodule once clinically stable ?-Encourage smoking cessation ? ?Community Acquired Pneumonia ?-Monitor fever curve ?-Trend WBC's & Procalcitonin ?-Follow cultures as above ?-Continue empiric Levaquin & Vancomycin pending cultures & sensitivities ? ? ? ?Patient is acutely ill-appearing, superimposed on chronic comorbidities most notably for severe COPD requiring 4 to 5 L supplemental oxygen at baseline.  Prognosis is extremely guarded, high risk for decompensation requiring intubation, cardiac arrest and death.  Recommend DNR/DNI status.  Palliative care is consulted for assistance with further goals of care discussions. ? ?Best Practice (right click and "Reselect all SmartList Selections" daily)  ? ?Diet/type: Regular consistency (see orders) ?DVT prophylaxis: SCD ?GI prophylaxis: N/A ?Lines: N/A ?Foley:  N/A ?Code Status:  full code ?Last date of multidisciplinary goals of care discussion [N/A] ? ?Labs   ?CBC: ?Recent Labs  ?Lab 08/26/21 ?1733 08/27/21 ?7846 08/28/21 ?0423 08/29/21 ?0409  ?WBC 15.4* 7.0 15.8* 13.7*  ?NEUTROABS 12.6*  --   --   --   ?HGB 12.0* 10.7* 10.8* 11.2*  ?HCT 38.5* 33.6* 35.2* 37.0*  ?MCV 87.5 85.3 88.0 90.2  ?PLT 462* 412* 405* 398  ? ? ?Basic Metabolic Panel: ?Recent Labs  ?  Lab 08/26/21 ?1733 08/27/21 ?0254 08/27/21 ?9574 08/28/21 ?0423 08/29/21 ?0409  ?NA 131*  --  130* 133* 131*  ?K 3.8  --  4.0 4.1 4.3  ?CL 86*  --  87* 90* 91*  ?CO2 33*  --  36* 37* 33*   ?GLUCOSE 73  --  226* 103* 103*  ?BUN 14  --  14 19 19   ?CREATININE 0.48* 0.49* 0.39* 0.39* 0.60*  ?CALCIUM 8.8*  --  8.4* 8.6* 8.5*  ? ?GFR: ?Estimated Creatinine Clearance: 77.3 mL/min (A) (by C-G formula b

## 2021-08-29 NOTE — TOC Initial Note (Signed)
Transition of Care (TOC) - Initial/Assessment Note  ? ? ?Patient Details  ?Name: Jeremiah [name] ?MRN: [id number] ?Date of Birth: 1950-12-13 ? ?Transition of Care (TOC) CM/SW Contact:    ?Shelbie Hutching, RN ?Phone Number: ?08/29/2021, 2:30 PM ? ?Clinical Narrative:                 ?Patient admitted to the hospital for acute on chronic respiratory failure on chronic oxygen from [place] 4L at home.  Patient also has a CPAP and nebulizer at home.  RNCM met with patient at the bedside introduced self and explained role in discharge planning.  Patient is from home where he lives with his roommate, [name].  Patient is independent and walks with a walker.  He does not drive but friends will take him to appointments.  Patient is current with his PCP, he gets prescriptions mail order.  [facility] is open with [facility], Merleen Nicely with [facility] is aware of patient being in the hospital.   ?TOC will cont to follow.   ? ? ?Expected Discharge Plan: [place] ?Barriers to Discharge: Continued Medical Work up ? ? ?Patient Goals and CMS Choice ?Patient states their goals for this hospitalization and ongoing recovery are:: to feel better and get home ?CMS Medicare.gov Compare Post Acute Care list provided to:: Patient ?Choice offered to / list presented to : Patient ? ?Expected Discharge Plan and Services ?Expected Discharge Plan: [place] ?  ?Discharge Planning Services: CM Consult ?Post Acute Care Choice: Home Health ?Living arrangements for the past 2 months: Mobile Home ?                ?DME Arranged: N/A ?DME Agency: NA ?  ?  ?  ?HH Arranged: RN, PT, OT ?[place] Agency: [facility] ?Date HH Agency Contacted: 08/29/21 ?Time [place]: 4970 ?Representative spoke with at [place]: Juanda Crumble ? ?Prior Living Arrangements/Services ?Living arrangements for the past 2 months: Mobile Home ?Lives with:: Roommate ?Patient language and need for interpreter reviewed:: Yes ?Do you feel safe  going back to the place where you live?: Yes      ?Need for Family Participation in Patient Care: Yes (Comment) ?Care giver support system in place?: Yes (comment) ?Current home services: DME (oxygen, CPAP, nebulizer, walker) ?Criminal Activity/Legal Involvement Pertinent to Current Situation/Hospitalization: No - Comment as needed ? ?Activities of Daily Living ?  ?  ? ?Permission Sought/Granted ?Permission sought to share information with : Case Manager, Family Supports, Other (comment) ?Permission granted to share information with : Yes, Verbal Permission Granted ? Share Information with NAME: [name] ? Permission granted to share info w AGENCY: [facility] ? Permission granted to share info w Relationship: significant other ? Permission granted to share info w Contact Information: [phone] ? ?Emotional Assessment ?Appearance:: Appears older than stated age ?Attitude/Demeanor/Rapport: Engaged ?Affect (typically observed): Accepting ?Orientation: : Oriented to Self, Oriented to Place, Oriented to  Time, Oriented to Situation ?Alcohol / Substance Use: Not Applicable ?Psych Involvement: No (comment) ? ?Admission diagnosis:  Shortness of breath [R06.02] ?Pleural effusion [J90] ?Hypoxia [R09.02] ?Acute on chronic respiratory failure with hypoxia (HCC) [J96.21] ?Pneumonia due to infectious organism, unspecified laterality, unspecified part of lung [J18.9] ?Patient Active Problem List  ? Diagnosis Date Noted  ? Severe sepsis ([place]) 08/27/2021  ? Left lower lobe pulmonary nodule 08/26/2021  ? Depression 08/26/2021  ? Sepsis due to pneumonia ([place]) 08/26/2021  ? Pleural effusion on right  08/26/2021  ? Chronic diastolic CHF (congestive heart failure) ([place]) 07/01/2021  ? Chronic hypoxemic respiratory failure ([place]) 07/01/2021  ? CAP (community acquired pneumonia) 06/30/2021  ? COPD with acute exacerbation ([place]) 06/30/2021  ? Thrush, oral 06/16/2021  ? Unintentional weight loss 06/16/2021  ? Pressure injury of skin  06/16/2021  ? Protein-calorie malnutrition, severe 06/16/2021  ? Dehydration   ? Syncope 06/15/2021  ? Malnutrition of moderate degree 05/31/2021  ? GERD (gastroesophageal reflux disease) 05/28/2021  ? Acute respiratory failure with hypoxia ([place]) 05/28/2021  ? Anemia 10/18/2020  ? COPD, moderate ([place]) 05/14/2020  ? Nicotine dependence 05/14/2020  ? Hyponatremia 05/14/2020  ? HTN (hypertension) 05/14/2020  ? Abnormal LFTs 05/14/2020  ? Acute on chronic respiratory failure with hypoxia ([place]) 10/15/2016  ? Tobacco dependency 10/15/2016  ? ?PCP:  [place]:   ?[facility] ?[address], [address] ?[place] [address] ?Phone: [phone] Fax: [phone] ? ?[place] [place] ?[address] ?[place] [address] ?Phone: [phone] Fax: [phone] ? ?[place] [place] [facility] [place] ?[place] ?[place] [address] ?Phone: [phone] Fax: [phone] ? ? ? ? ?Social Determinants of Health (SDOH) Interventions ?  ? ?Readmission Risk Interventions ? ?  08/29/2021  ?  2:00 PM 10/21/2020  ?  1:14 PM 09/27/2020  ? 11:18 AM  ?Readmission Risk Prevention Plan  ?Transportation Screening Complete Complete Complete  ?PCP or Specialist Appt within 3-5 Days  Complete Complete  ?[place] or Home Care Consult  Complete Complete  ?Social Work Consult for [place] Planning/Counseling  Complete Not Complete  ?SW consult not completed comments   RNCM assigned to patient  ?Palliative Care Screening  Not Applicable Not Applicable  ?Medication Review Press photographer) Complete Complete Complete  ?PCP or Specialist appointment within 3-5 days of discharge Complete    ?[place] or Home Care Consult Complete    ?SW Recovery Care/Counseling Consult Complete    ?Palliative Care Screening Not Applicable    ?[place] Not Complete    ?SNF Comments waiting on PT recomendation     ? ? ? ?

## 2021-08-29 NOTE — Progress Notes (Signed)
Pharmacy Antibiotic Note ? ?Jeremiah [name] is a 71 y.o. male admitted on 08/26/2021 with sepsis.  Pharmacy has been consulted for Cefepime and Vancomycin dosing. ? ?Patient with documented penicillin allergy but has tolerated multiple doses of ceftriaxone and previously tolerated cefepime as well.  ? ?Plan: ?Continue levofloxacin 750 mg daily.  ? ?2. Continue vancomycin 750mg  IV q 12hours ?Used TBW, Vd 0.72 ?Goal AUC 400-600 ?Calculated AUC: 477 ?Cmax 29 ?Cmin 13 ? ?Follow up cultures from thoracentesis, renal function, length of therapy. ? ?Height: 6' (182.9 cm) ?Weight: 63.6 kg (140 lb 3.4 oz) ?IBW/kg (Calculated) : 77.6 ? ?Temp (24hrs), Avg:97.3 ?F (36.3 ?C), Min:97.1 ?F (36.2 ?C), Max:97.6 ?F (36.4 ?C) ? ?Recent Labs  ?Lab 08/26/21 ?1733 08/27/21 ?0254 08/27/21 ?6283 08/28/21 ?0423 08/28/21 ?1123 08/28/21 ?2048 08/29/21 ?0409  ?WBC 15.4*  --  7.0 15.8*  --   --  13.7*  ?CREATININE 0.48* 0.49* 0.39* 0.39*  --   --  0.60*  ?LATICACIDVEN  --  0.9  --   --   --   --   --   ?[place]  --   --   --   --   --  21*  --   ?VANCOPEAK  --   --   --   --  34  --   --   ? ?  ?Estimated Creatinine Clearance: 77.3 mL/min (A) (by C-G formula based on SCr of 0.6 mg/dL (L)).   ? ?Allergies  ?Allergen Reactions  ? Penicillins Anaphylaxis, Swelling and Other (See Comments)  ?  Tolerated Ceftriaxone 06/2020 ?05/2020 childhood reaction and his mother told him that he "swelled up." ? ?Had a PCN reaction causing immediate rash, facial/tongue/throat swelling, SOB or lightheadedness with hypotension: Yes ?Had a PCN reaction causing severe rash involving mucus membranes or skin necrosis: No ?Had a PCN reaction that required hospitalization No ?Had a PCN reaction occurring within the last 10 years: No ?If all the above answers are "NO", may proceed with Cephalosporin use.  ? ? ?Antimicrobials this admission: ?Cefepime  >> D/C by provider prior to 1st dose ?Vancomycin >>  ?Levaquin >> ?Aztreonam >> 4/22 ? ?Dose adjustments this  admission: ? ?Microbiology results: ?4/19 BCx: NG x 2 days ?4/22 MRSA PCR: detected ? ? ?Thank you for allowing pharmacy to be a part of this pleasant patient?s care. ? ? ?Wynelle Cleveland, PharmD ?Pharmacy Resident  ?08/29/2021 ?9:04 AM ? ? ?

## 2021-08-30 ENCOUNTER — Inpatient Hospital Stay: Payer: Medicare HMO

## 2021-08-30 DIAGNOSIS — J9611 Chronic respiratory failure with hypoxia: Secondary | ICD-10-CM | POA: Diagnosis not present

## 2021-08-30 DIAGNOSIS — J9621 Acute and chronic respiratory failure with hypoxia: Secondary | ICD-10-CM | POA: Diagnosis not present

## 2021-08-30 DIAGNOSIS — J449 Chronic obstructive pulmonary disease, unspecified: Secondary | ICD-10-CM | POA: Diagnosis not present

## 2021-08-30 DIAGNOSIS — Z515 Encounter for palliative care: Secondary | ICD-10-CM | POA: Diagnosis not present

## 2021-08-30 DIAGNOSIS — J189 Pneumonia, unspecified organism: Secondary | ICD-10-CM | POA: Diagnosis not present

## 2021-08-30 DIAGNOSIS — R911 Solitary pulmonary nodule: Secondary | ICD-10-CM

## 2021-08-30 DIAGNOSIS — J90 Pleural effusion, not elsewhere classified: Secondary | ICD-10-CM | POA: Diagnosis not present

## 2021-08-30 DIAGNOSIS — Z7189 Other specified counseling: Secondary | ICD-10-CM | POA: Diagnosis not present

## 2021-08-30 LAB — BASIC METABOLIC PANEL
Anion gap: 7 (ref 5–15)
BUN: 23 mg/dL (ref 8–23)
CO2: 38 mmol/L — ABNORMAL HIGH (ref 22–32)
Calcium: 8.5 mg/dL — ABNORMAL LOW (ref 8.9–10.3)
Chloride: 86 mmol/L — ABNORMAL LOW (ref 98–111)
Creatinine, Ser: 0.87 mg/dL (ref 0.61–1.24)
GFR, Estimated: >60 mL/min (ref >60)
Glucose, Bld: 248 mg/dL — ABNORMAL HIGH (ref 70–99)
Potassium: 3.8 mmol/L (ref 3.5–5.1)
Sodium: 131 mmol/L — ABNORMAL LOW (ref 135–145)

## 2021-08-30 LAB — PHOSPHORUS: Phosphorus: 3.4 mg/dL (ref 2.5–4.6)

## 2021-08-30 LAB — CBC
HCT: 36.1 % — ABNORMAL LOW (ref 39.0–52.0)
Hemoglobin: 11.2 g/dL — ABNORMAL LOW (ref 13.0–17.0)
MCH: 27.5 pg (ref 26.0–34.0)
MCHC: 31 g/dL (ref 30.0–36.0)
MCV: 88.5 fL (ref 80.0–100.0)
Platelets: 403 10*3/uL — ABNORMAL HIGH (ref 150–400)
RBC: 4.08 MIL/uL — ABNORMAL LOW (ref 4.22–5.81)
RDW: 16.9 % — ABNORMAL HIGH (ref 11.5–15.5)
WBC: 11.9 10*3/uL — ABNORMAL HIGH (ref 4.0–10.5)
nRBC: 0 % (ref 0.0–0.2)

## 2021-08-30 LAB — VANCOMYCIN, RANDOM: Vancomycin Rm: 26

## 2021-08-30 LAB — MAGNESIUM: Magnesium: 1.5 mg/dL — ABNORMAL LOW (ref 1.7–2.4)

## 2021-08-30 MED ORDER — TAMSULOSIN HCL 0.4 MG PO CAPS
0.4000 mg | ORAL_CAPSULE | Freq: Every day | ORAL | Status: DC
  Administered 2021-08-30 – 2021-09-08 (×10): 0.4 mg via ORAL
  Filled 2021-08-30 (×10): qty 1

## 2021-08-30 MED ORDER — VANCOMYCIN VARIABLE DOSE PER UNSTABLE RENAL FUNCTION (PHARMACIST DOSING): Status: DC

## 2021-08-30 MED ORDER — LEVOFLOXACIN 500 MG PO TABS
750.0000 mg | ORAL_TABLET | Freq: Every day | ORAL | Status: DC
  Administered 2021-08-31: 750 mg via ORAL
  Filled 2021-08-30: qty 2

## 2021-08-30 MED ORDER — ENOXAPARIN SODIUM 40 MG/0.4ML IJ SOSY
40.0000 mg | PREFILLED_SYRINGE | INTRAMUSCULAR | Status: DC
  Administered 2021-08-30 – 2021-09-07 (×9): 40 mg via SUBCUTANEOUS
  Filled 2021-08-30 (×9): qty 0.4

## 2021-08-30 MED ORDER — MAGNESIUM SULFATE 4 GM/100ML IV SOLN
4.0000 g | Freq: Once | INTRAVENOUS | Status: AC
  Administered 2021-08-30: 4 g via INTRAVENOUS
  Filled 2021-08-30: qty 100

## 2021-08-30 MED ORDER — LEVOFLOXACIN 750 MG PO TABS: 750.0000 mg | ORAL_TABLET | Freq: Every day | ORAL | Status: DC

## 2021-08-30 NOTE — Progress Notes (Signed)
Pharmacy Antibiotic Note ? ?Jeremiah [name] is a 71 y.o. male admitted on 08/26/2021 with sepsis.  Pharmacy has been consulted for Cefepime and Vancomycin dosing. ? ?Patient with documented penicillin allergy but has tolerated multiple doses of ceftriaxone and previously tolerated cefepime as well.  ? ?Scr has doubled from baseline in past 48 hours (Scr 0.39 > 0.60 > 0.87). Vancomycin random 26 (~30 minutes prior to dose). ? ?Plan: ?Continue levofloxacin  750 mg PO daily.  ? ?2. Hold vancomycin maintenance doses. Follow up vancomycin random level 4/26 AM.  ? ?Follow up cultures from thoracentesis, renal function, length of therapy. ?Height: 6' (182.9 cm) ?Weight: 63.6 kg (140 lb 3.4 oz) ?IBW/kg (Calculated) : 77.6 ? ?Temp (24hrs), Avg:98.4 ?F (36.9 ?C), Min:97.7 ?F (36.5 ?C), Max:99.1 ?F (37.3 ?C) ? ?Recent Labs  ?Lab 08/26/21 ?1733 08/27/21 ?0254 08/27/21 ?9311 08/28/21 ?0423 08/28/21 ?1123 08/28/21 ?2048 08/29/21 ?0409 08/30/21 ?2162  ?WBC 15.4*  --  7.0 15.8*  --   --  13.7* 11.9*  ?CREATININE 0.48* 0.49* 0.39* 0.39*  --   --  0.60* 0.87  ?LATICACIDVEN  --  0.9  --   --   --   --   --   --   ?[place]  --   --   --   --   --  21*  --   --   ?VANCOPEAK  --   --   --   --  34  --   --   --   ? ?  ?Estimated Creatinine Clearance: 71.1 mL/min (by C-G formula based on SCr of 0.87 mg/dL).   ? ?Allergies  ?Allergen Reactions  ? Penicillins Anaphylaxis, Swelling and Other (See Comments)  ?  Tolerated Ceftriaxone 06/2020 ?05/2020 childhood reaction and his mother told him that he "swelled up." ? ?Had a PCN reaction causing immediate rash, facial/tongue/throat swelling, SOB or lightheadedness with hypotension: Yes ?Had a PCN reaction causing severe rash involving mucus membranes or skin necrosis: No ?Had a PCN reaction that required hospitalization No ?Had a PCN reaction occurring within the last 10 years: No ?If all the above answers are "NO", may proceed with Cephalosporin use.  ? ? ?Antimicrobials this  admission: ?Cefepime  >> D/C by provider prior to 1st dose ?Vancomycin >>  ?Levaquin >> ?Aztreonam >> 4/22 ? ?Dose adjustments this admission: ? ?Microbiology results: ?4/19 BCx: NG x 2 days ?4/22 MRSA PCR: detected ? ?Thank you for allowing pharmacy to be a part of this pleasant patient?s care. ? ? ?Wynelle Cleveland, PharmD ?Pharmacy Resident  ?08/30/2021 ?8:22 AM ? ?

## 2021-08-30 NOTE — Evaluation (Signed)
Physical Therapy Evaluation ?Patient Details ?Name: Jeremiah [name] ?MRN: [id number] ?DOB: 1950/12/14 ?Today's Date: 08/30/2021 ? ?History of Present Illness ? Mr. Fawzi Melman is a 71 year old male with history of tobacco dependence, depression, anxiety, COPD, hypertension, GERD, chronic respiratory failure on 4 to 5 L nasal cannula at home, who presents emergency department for chief concerns of worsening shortness of breath for 2 weeks. ? ?  ?Clinical Impression ? Pt received in supine position and agreeable to therapy.  Pt performed bed-level exercises with good technique, however requires therapeutic rest breaks due to SOB.  Pt then performed bed mobility to come upright at EOB.  Pt significantly weak in the LE's and UE's during MMT.  Pt did attempt to stand on multiple attempts but unable to clear buttocks from bed.  Pt was able to scoot towards [facility] however for proper positioning in bed.  Pt given minA to stand, but pt refused any assistance from therapist to stand upright.  Pt returned to bed well all needs met and call bell within reach.  Current discharge plans to SNF are appropriate at this time.  Pt will continue to benefit from skilled therapy in order to address deficits listed below. ? ?   ? ?Recommendations for follow up therapy are one component of a multi-disciplinary discharge planning process, led by the attending physician.  Recommendations may be updated based on patient status, additional functional criteria and insurance authorization. ? ?Follow Up Recommendations Skilled nursing-short term rehab (<3 hours/day) ? ?  ?Assistance Recommended at Discharge Frequent or constant Supervision/Assistance  ?Patient can return home with the following ? A little help with walking and/or transfers;A little help with bathing/dressing/bathroom ? ?  ?Equipment Recommendations Rolling walker (2 wheels)  ?Recommendations for Other Services ?    ?  ?Functional Status Assessment Patient has had a recent decline in  their functional status and demonstrates the ability to make significant improvements in function in a reasonable and predictable amount of time.  ? ?  ?Precautions / Restrictions Restrictions ?Weight Bearing Restrictions: No  ? ?  ? ?Mobility ? Bed Mobility ?Overal bed mobility: Needs Assistance ?Bed Mobility: Supine to Sit ?  ?  ?Supine to sit: Min assist ?  ?  ?General bed mobility comments: HOB elevated and pt requires handrails to come upright. ?  ? ?Transfers ?Overall transfer level: Needs assistance ?Equipment used: Rolling walker (2 wheels) ?Transfers: Sit to/from Stand ?Sit to Stand: Min assist ?  ?  ?  ?  ?  ?General transfer comment: pt unable to come upright with minA, and refused therapist assistance.  bed elevate as well. ?  ? ?Ambulation/Gait ?  ?  ?  ?  ?  ?  ?  ?General Gait Details: not attempted due to inability to stand from bed ? ?Stairs ?  ?  ?  ?  ?  ? ?Wheelchair Mobility ?  ? ?Modified Rankin (Stroke Patients Only) ?  ? ?  ? ?Balance Overall balance assessment: Mild deficits observed, not formally tested ?  ?  ?  ?  ?  ?  ?  ?  ?  ?  ?  ?  ?  ?  ?  ?  ?  ?  ?   ? ? ? ?Pertinent Vitals/Pain Pain Assessment ?Pain Assessment: 0-10 ?Pain Score: 7  ?Pain Location: All over the body ?Pain Intervention(s): Limited activity within patient's tolerance, Monitored during session, Repositioned  ? ? ?Home Living Family/patient expects to be discharged to:: Private residence ?  Living Arrangements: Non-relatives/Friends ?Available Help at Discharge: Friend(s);Available PRN/intermittently ?Type of Home: Mobile home ?Home Access: Stairs to enter ?Entrance Stairs-Rails: Right;Left ?Entrance Stairs-Number of Steps: 10 at front with wideset bilat rails; 2 at back door, but unable to access back door ?  ?Home Layout: One level ?Home Equipment: BSC/3in1;Rolling Walker (2 wheels) ?   ?  ?Prior Function Prior Level of Function : Independent/Modified Independent ?  ?  ?  ?  ?  ?  ?Mobility Comments: Ind amb without  AD PRN for household ambulation; uses facility w/c for MD visits ?ADLs Comments: Pt reports MOD-I (increased time/effort) for ADLs. Friends assist with meals ?  ? ? ?Hand Dominance  ? Dominant Hand: Right ? ?  ?Extremity/Trunk Assessment  ? Upper Extremity Assessment ?Upper Extremity Assessment: Generalized weakness ?  ? ?Lower Extremity Assessment ?Lower Extremity Assessment: Generalized weakness ?  ? ?   ?Communication  ? Communication: No difficulties  ?Cognition Arousal/Alertness: Awake/alert ?Behavior During Therapy: [facility] for tasks assessed/performed ?Overall Cognitive Status: Within Functional Limits for tasks assessed ?  ?  ?  ?  ?  ?  ?  ?  ?  ?  ?  ?  ?  ?  ?  ?  ?General Comments: Pt somewhat difficult to understand due to lack of voice, but communicates well. ?  ?  ? ?  ?General Comments   ? ?  ?Exercises Total Joint Exercises ?Ankle Circles/Pumps: AROM, Strengthening, Both, 10 reps, Supine ?Quad Sets: AROM, Strengthening, Both, 10 reps, Supine ?Gluteal Sets: AROM, Strengthening, Both, 10 reps, Supine ?Hip ABduction/ADduction: AROM, Strengthening, Both, 10 reps, Supine ?Long Arc Quad: AROM, Strengthening, Both, 10 reps, Seated  ? ?Assessment/Plan  ?  ?PT Assessment Patient needs continued PT services  ?PT Problem List Decreased strength;Decreased range of motion;Decreased activity tolerance;Decreased balance;Decreased mobility;Decreased knowledge of use of DME;Decreased safety awareness ? ?   ?  ?PT Treatment Interventions DME instruction;Gait training;Functional mobility training;Therapeutic activities;Therapeutic exercise;Balance training;Neuromuscular re-education   ? ?PT Goals (Current goals can be found in the Care Plan section)  ?Acute Rehab PT Goals ?Patient Stated Goal: to go home. ?PT Goal Formulation: With patient ?Time For Goal Achievement: 09/13/21 ?Potential to Achieve Goals: Poor ? ?  ?Frequency Min 2X/week ?  ? ? ?Co-evaluation   ?  ?  ?  ?  ? ? ?  ?AM-PAC PT "6 Clicks" Mobility  ?Outcome  Measure Help needed turning from your back to your side while in a flat bed without using bedrails?: A Little ?Help needed moving from lying on your back to sitting on the side of a flat bed without using bedrails?: A Little ?Help needed moving to and from a bed to a chair (including a wheelchair)?: A Lot ?Help needed standing up from a chair using your arms (e.g., wheelchair or bedside chair)?: A Lot ?Help needed to walk in hospital room?: Total ?Help needed climbing 3-5 steps with a railing? : Total ?6 Click Score: 12 ? ?  ?End of Session Equipment Utilized During Treatment: Oxygen ?Activity Tolerance: Patient tolerated treatment well ?Patient left: in bed;with call bell/phone within reach;with bed alarm set;with nursing/sitter in room ?Nurse Communication: Mobility status ?PT Visit Diagnosis: Unsteadiness on feet (R26.81);Other abnormalities of gait and mobility (R26.89);Muscle weakness (generalized) (M62.81);Difficulty in walking, not elsewhere classified (R26.2) ?  ? ?Time: 7858-8502 ?PT Time Calculation (min) (ACUTE ONLY): 34 min ? ? ?Charges:   PT Evaluation ?$PT Eval Moderate Complexity: 1 Mod ?PT Treatments ?$Therapeutic Exercise: 8-22 mins ?$Therapeutic Activity: 8-22  mins ?  ?   ? ? ?Gwenlyn Saran, PT, DPT ?08/30/21, 4:13 PM ? ? ?Christie Nottingham ?08/30/2021, 4:04 PM ? ?

## 2021-08-30 NOTE — Consult Note (Signed)
? ?NAME:  Jeremiah [name], MRN:  [id number], DOB:  1951/02/01, LOS: 4 ?ADMISSION DATE:  08/26/2021, CONSULTATION DATE:  08/29/2021 ?REFERRING MD:  Dr. Jimmye Norman, CHIEF COMPLAINT:  Acute on chronic Hypoxic respiratory failure  ? ?Brief Pt Description / Synopsis:  ?71 y.o. Male with PMH significant for COPD requiring 4L supplemental O2 at baseline admitted with Acute on Chronic Hypoxic Respiratory Failure in the setting of AECOPD, Community Acquired Pneumonia, large (partially loculated) Right Pleural Effusion.  Also found to have new LLL spiculated lung nodule. ? ?History of Present Illness:  ?Jeremiah [name] is a 71 year old male with a past medical history significant for COPD requiring 4 to 5 L supplemental O2 at home, tobacco abuse, hypertension, anxiety, depression, GERD who presented to [facility] ED on 08/26/2021 due to complaints of worsening shortness of breath for approximately 2 weeks. ? ?He also reported worsening cough that was productive of dark yellow sputum.  He also endorses that he is lost a significant mount of weight in the last 6 months, unable to provide a specific amount of pounds.  He denied fever, chills, nausea, vomiting, chest pain, abdominal pain, dysuria. ? ?ED Course: ?Initial Vital Signs: Temperature 97.3 ?F, respiratory rate 25, pulse 102, blood pressure 119/76, SPO2 98% on BiPAP ?Significant Labs: Serum sodium is 131, potassium 3.8, chloride 86, bicarb of 33, BUN of 14, serum creatinine of 0.48, GFR greater than 60, nonfasting blood glucose 73, WBC elevated at 15.4, hemoglobin 12, platelets of 462.  COVID/influenza A/influenza B PCR were negative ?Imaging ?Chest X-ray>>IMPRESSION: ?1. Interval increase of an at least small volume right pleural ?effusion. ?2. Aortic Atherosclerosis (ICD10-I70.0) and Emphysema  ?CTA Chest>>IMPRESSION: ?1. Marked severity emphysematous lung disease with marked severity ?right lower lobe consolidation. ?2. Large partially loculated right-sided pleural effusion. ?3.  Fracture deformities involving the superior endplates of the T8 ?and T11 vertebral bodies, new when compared to the prior exam. ?4. New 12 mm x 6 mm spiculated lung nodule versus area of focal ?scarring within the posterior aspect of the left lower lobe. ?Correlation with follow-up PET-CT or tissue sampling is recommended. ?This recommendation follows the consensus statement: Guidelines for ?Management of Incidental Pulmonary Nodules Detected on CT Images: ?From the Fleischner Society 2017; Radiology 2017; 284:228-243. ?5. Small, approximately 11 mm thick anterior pericardial effusion. ?6. Coronary artery disease. ? ?CXR 4/24  ?RLL s/p thoracentesis residual moderate layering effusion ? ? ? ?CXR 4/25  ?RLL effusion seems to be improving ? ? ? ?Medications Administered: DuoNeb x2, Solu-Medrol 125 mg IV one-time dose, aztreonam and vancomycin. ? ?He was admitted by the hospitalist for further work-up and treatment of acute on chronic hypoxic respiratory failure. ? ?Please see "Significant Hospital Events" section below for full detailed hospital course. ? ? ? ? ?Micro Data:  ?4/21: SARS-CoV-2 and influenza PCR>> negative ?4/22: Blood culture x2>> no growth to date ?4/22: Legionella urinary antigen>> ?4/22: MRSA PCR>> positive ? ?Antimicrobials:  ?Cefepime 4/21 x 1 dose ?Aztreonam 4/21>> 4/22 ?Levofloxacin 4/21>> ?Vancomycin 4/21>> ? ?Significant Hospital Events: ?Including procedures, antibiotic start and stop dates in addition to other pertinent events   ?4/21: Admitted by the hospitalist for commune acquired pneumonia and COPD exacerbation ?4/23: Increased oxygen demand, respiratory rate, desaturations with minimal movement.  Was started on IV Lasix twice daily.  Palliative care consulted.  IR consulted for thoracentesis ?4/24: PCCM asked to see for pulmonary consult given accessory muscle use and high risk for decompensation.  thoracentesis by IR removed 1.2 L fluid yellow ? ?Interim History /  Subjective:  ?Remains  SOB ?States its better ?CXR reviewed in detail ?Remains on oxygen ?Burned his nose using cigarettes and oxygen ?-Palliative care consult is pending for further goals of care discussion ? able to speak in full sentences, diminished coarse breath sounds throughout ?Fluid analysis c/w EXUDATIVE PROCESS, GRAM STAIN is negative ? ?Objective   ?Blood pressure (!) 94/55, pulse 79, temperature 98.6 ?F (37 ?C), temperature source Oral, resp. rate 10, height 6' (1.829 m), weight 63.6 kg, SpO2 99 %. ?   ?   ? ?Intake/Output Summary (Last 24 hours) at 08/30/2021 0748 ?Last data filed at 08/30/2021 0541 ?Gross per 24 hour  ?Intake 740.47 ml  ?Output 4150 ml  ?Net -3409.53 ml  ? ? ?Filed Weights  ? 08/26/21 1727 08/27/21 0100  ?Weight: 72.6 kg 63.6 kg  ? ? ? ?Review of Systems: ? ?Gen:  Denies  fever, sweats, chills weight loss  ?HEENT: Denies blurred vision, double vision, ear pain, eye pain, hearing loss, nose bleeds, sore throat ?Cardiac:  No dizziness, chest pain or heaviness, chest tightness,edema, No JVD ?Resp:   No cough, -sputum production, +shortness of breath,-wheezing, -hemoptysis,  ?Gi: Denies swallowing difficulty, stomach pain, nausea or vomiting, diarrhea, constipation, bowel incontinence ?Gu:  Denies bladder incontinence, burning urine ?Ext:   Denies Joint pain, stiffness or swelling ?Skin: Denies  skin rash, easy bruising or bleeding or hives ?Endoc:  Denies polyuria, polydipsia , polyphagia or weight change ?Psych:   Denies depression, insomnia or hallucinations  ?Other:  All other systems negative ? ? ? ? ?Physical Examination:  ? ?General Appearance: mild distress cachectic ?EYES PERRLA, EOM intact.   ?NECK Supple, No JVD ?Pulmonary: diminished BS and rhonchi ?CardiovascularNormal S1,S2.  No m/r/g.   ?Abdomen: Benign, Soft, non-tender. ?Skin:   warm, no rashes, no ecchymosis  ?Extremities: normal, no cyanosis, clubbing. ?Neuro:without focal findings,  speech normal  ?PSYCHIATRIC: Mood, affect within normal  limits. ? ? ? ?Assessment & Plan:  ? ?Acute on Chronic Hypoxic Respiratory Failure in the setting of AECOPD, Community Acquired Pneumonia, large (partially loculated) Right Pleural Effusion ?New LLL spiculated lung nodule ?Para pneumonic Effusion seems to be improving based on CXR ?-Supplemental O2 as needed to maintain O2 sats 88 to 92% ?-BiPAP if needed ?-Follow intermittent Chest X-ray & ABG as needed ?-Bronchodilators & Pulmicort nebs ?ECHO WNL will stop lasix ?-Pulmonary toilet as able ?-Will need outpatient work up of nodule once clinically stable ?-Encourage smoking cessation ?Ouot of bed to chair as tolerated ? ?SEVERE COPD EXACERBATION ?-continue IV steroids as prescribed ?-continue NEB THERAPY as prescribed ?-morphine as needed ?-wean fio2 as needed and tolerated ? ? ?Community Acquired Pneumonia ?-Monitor fever curve ?-Trend WBC's & Procalcitonin ?-Follow cultures as above ?-Continue empiric Levaquin & Vancomycin pending cultures & sensitivities ? ?Recommend DNR/DNI status.  Palliative care is consulted for assistance with further goals of care discussions. ? ?Best Practice (right click and "Reselect all SmartList Selections" daily)  ? ?Diet/type: Regular consistency (see orders) ?DVT prophylaxis: SCD ?GI prophylaxis: N/A ?Lines: N/A ?Foley:  N/A ?Code Status:  full code ?Last date of multidisciplinary goals of care discussion [N/A] ? ?Labs   ?CBC: ?Recent Labs  ?Lab 08/26/21 ?1733 08/27/21 ?7169 08/28/21 ?0423 08/29/21 ?6789 08/30/21 ?3810  ?WBC 15.4* 7.0 15.8* 13.7* 11.9*  ?NEUTROABS 12.6*  --   --   --   --   ?HGB 12.0* 10.7* 10.8* 11.2* 11.2*  ?HCT 38.5* 33.6* 35.2* 37.0* 36.1*  ?MCV 87.5 85.3 88.0 90.2 88.5  ?PLT 462*  412* 405* 398 403*  ? ? ? ?Basic Metabolic Panel: ?Recent Labs  ?Lab 08/26/21 ?1733 08/27/21 ?0254 08/27/21 ?7939 08/28/21 ?0423 08/29/21 ?0300 08/30/21 ?9233  ?NA 131*  --  130* 133* 131* 131*  ?K 3.8  --  4.0 4.1 4.3 3.8  ?CL 86*  --  87* 90* 91* 86*  ?CO2 33*  --  36* 37* 33* 38*   ?GLUCOSE 73  --  226* 103* 103* 248*  ?BUN 14  --  14 19 19 23   ?CREATININE 0.48* 0.49* 0.39* 0.39* 0.60* 0.87  ?CALCIUM 8.8*  --  8.4* 8.6* 8.5* 8.5*  ?MG  --   --   --   --   --  1.5*  ?PHOS  --   --   --

## 2021-08-30 NOTE — Consult Note (Signed)
? ?                                                                                ?Consultation Note ?Date: 08/30/2021  ? ?Patient Name: Jeremiah Jeremiah [name]  ?DOB: [birth year]  MRN: [id number]  Age / Sex: 71 y.o., male  ?PCP: [facility] ?Referring Physician: Wyvonnia Dusky, MD ? ?Reason for Consultation: Establishing goals of care ? ?HPI/Patient Profile: 71 y.o. male  with past medical history of COPD requiring 4 to 5 L of oxygen at home, tobacco abuse, HTN, anxiety/depression, GERD, malnutrition, noted positive for unintentional weight loss over the last 6 months, unable to give specifics, admitted on 08/26/2021 with acute on chronic respiratory failure with hypoxia, right pleural effusion with thoracentesis 4/24 1.2 L of fluid removed/pathology pending. ? ?Clinical Assessment and Goals of Care: ?I have reviewed medical records including EPIC notes, labs and imaging, received report from RN, assessed the patient.  Jeremiah Jeremiah [name], Jeremiah [name], is sitting up in the bed in his room.  He greets me, making but not keeping eye contact.  He appears acutely/chronically ill and quite frail, older than stated age.  He is alert and oriented x3, able to make his basic needs known.  There is no family at bedside at this time. ? ?We meet at the bedside to discuss diagnosis prognosis, GOC, EOL wishes, disposition and options.  I introduced Palliative Medicine as specialized medical care for people living with serious illness. It focuses on providing relief from the symptoms and stress of a serious illness. The goal is to improve quality of life for both the patient and the family. ? ?We discussed a brief life review of the patient.  Jeremiah Jeremiah [name] tells me that he worked most of his life in the [employer].  He shares that he has been divorced for several decades.  He has a child, but they are not close and he declines to give name or contact information.  Jeremiah Jeremiah [name] tells me that he and his friend, [name], have lived together for 4  to 5 years.  He shares that they are just friends, Annette's boyfriend lives with them also.   ? ?We then focused on their current illness.  Somewhat limited interview today as Jeremiah Jeremiah [name] does not want to talk about difficult issues.  He states that he feels that he needs to "go away and think about things on his own".  The natural disease trajectory and expectations at EOL were discussed. ? ?Advanced directives, concepts specific to code status, artifical feeding and hydration, and rehospitalization were considered and discussed.  We talked about the concept of "treat the treatable, but allowing natural passing".  At this point Jeremiah Jeremiah [name] shares that he would want attempted life support, but he is waiting on "talking to the doctors" about test results. ? ?Palliative Care services outpatient were briefly discussed.   ? ?Discussed the importance of continued conversation with family and the medical providers regarding overall plan of care and treatment options, ensuring decisions are within the context of the patient?s values and GOCs. Questions and concerns were addressed.  The patient was encouraged to call with questions or concerns.  PMT will continue  to support holistically. ? ?Conference with attending, bedside nursing staff, transition of care team related to patient condition, needs, goals of care, disposition. ? ? ?HCPOA ?OTHER -Jeremiah Jeremiah [name] tells me that he has named his friend, [name], as his healthcare surrogate.  I ask if he has paperwork.  He tells me that he believes that they do have paperwork but he is unsure. ?  ? ?SUMMARY OF RECOMMENDATIONS   ?At this point continue full scope/full code ?Time for test results and conferences with Drs. ?Considering CODE STATUS and disposition ?PMT to continue to follow. ? ? ?Code Status/Advance Care Planning: ?Full code -considering CODE STATUS.  Agreeable to future conversations. ? ?Symptom Management:  ?Per hospitalist, no additional needs at this time.  I asked  Jeremiah Jeremiah [name] if he has ever tried morphine for his breathing, but he tells me he has not, and at this point he is not interested. ? ?Palliative Prophylaxis:  ?Oral Care and Palliative Wound Care ? ?Additional Recommendations (Limitations, Scope, Preferences): ?Full Scope Treatment ? ?Psycho-social/Spiritual:  ?Desire for further Chaplaincy support:yes ?Additional Recommendations: Caregiving  Support/Resources and Education on Hospice ? ?Prognosis:  ?< 3 months, or less would not be surprising based on chronic illness burden, decreasing functional status, weight loss over the last 6 months, suspicion for lung cancer. ? ?Discharge Planning:  To be determined, based on outcomes.  Anticipate home with home health/palliative or hospice care   ? ?  ? ?Primary Diagnoses: ?Present on Admission: ? Acute on chronic respiratory failure with hypoxia ([place]) ? Anemia ? Chronic hypoxemic respiratory failure (HCC) ? COPD, moderate ([place]) ? GERD (gastroesophageal reflux disease) ? HTN (hypertension) ? Malnutrition of moderate degree ? Hyponatremia ? Tobacco dependency ? Protein-calorie malnutrition, severe ? Left lower lobe pulmonary nodule ? Depression ? Sepsis due to pneumonia [facility]) ? Unintentional weight loss ? Pleural effusion on right ? Severe sepsis ([place]) ? ? ?I have reviewed the medical record, interviewed the patient and family, and examined the patient. The following aspects are pertinent. ? ?Past Medical History:  ?Diagnosis Date  ? Acute and chronic respiratory failure (acute-on-chronic) ([place]) 10/18/2020  ? Asthma   ? COPD (chronic obstructive pulmonary disease) ([place])   ? O2 dependent - 2L  ? GERD (gastroesophageal reflux disease)   ? HTN (hypertension)   ? Multifocal pneumonia 06/16/2020  ? Other emphysema ([place]) 08/22/2016  ? Oxygen dependent   ? Sepsis ([place]) 05/14/2020  ? Tobacco dependence   ? ?Social History  ? ?Socioeconomic History  ? Marital status: Divorced  ?  Spouse name: Not on file  ? Number of children: Not on file  ?  Years of education: Not on file  ? Highest education level: Not on file  ?Occupational History  ? Not on file  ?Tobacco Use  ? Smoking status: Every Day  ?  Packs/day: 0.50  ?  Years: 50.00  ?  Pack years: 25.00  ?  Types: Cigarettes  ?  Last attempt to quit: 07/28/2015  ?  Years since quitting: 6.0  ? Smokeless tobacco: Never  ? Tobacco comments:  ?  started age 48.  ?Vaping Use  ? Vaping Use: Never used  ?Substance and Sexual Activity  ? Alcohol use: Yes  ?  Alcohol/week: 0.0 standard drinks  ?  Comment: beers occasionally  ? Drug use: No  ? Sexual activity: Not on file  ?Other Topics Concern  ? Not on file  ?Social History Narrative  ? Lives at home with wife.  ? ?  Social Determinants of Health  ? ?Financial Resource Strain: Not on file  ?Food Insecurity: Not on file  ?Transportation Needs: Not on file  ?Physical Activity: Not on file  ?Stress: Not on file  ?Social Connections: Not on file  ? ?Family History  ?Problem Relation Age of Onset  ? Cancer - Colon Father   ? Congestive Heart Failure Mother   ? ?Scheduled Meds: ? albuterol  2.5 mg Nebulization Q6H  ? atorvastatin  40 mg Oral Daily  ? budesonide (PULMICORT) nebulizer solution  0.5 mg Nebulization BID  ? Chlorhexidine Gluconate Cloth  6 each Topical Daily  ? DULoxetine  30 mg Oral Daily  ? enoxaparin (LOVENOX) injection  40 mg Subcutaneous Q24H  ? feeding supplement  237 mL Oral TID BM  ? [START ON 08/31/2021] levofloxacin  750 mg Oral Daily  ? methylPREDNISolone (SOLU-MEDROL) injection  40 mg Intravenous Daily  ? midodrine  10 mg Oral TID WC  ? montelukast  10 mg Oral QHS  ? multivitamin with minerals  1 tablet Oral Daily  ? pantoprazole  20 mg Oral Daily  ? sodium chloride  2 g Oral BID WC  ? tiotropium  18 mcg Inhalation Daily  ? vancomycin variable dose per unstable renal function (pharmacist dosing)   Does not apply See admin instructions  ? ?Continuous Infusions: ?PRN Meds:.acetaminophen **OR** acetaminophen, guaiFENesin-dextromethorphan,  ipratropium-albuterol, nicotine, ondansetron **OR** ondansetron (ZOFRAN) IV, sodium chloride, traMADol ?Medications Prior to Admission:  ?Prior to Admission medications   ?Medication Sig Start Date End Date Taking? A

## 2021-08-30 NOTE — Progress Notes (Signed)
?PROGRESS NOTE ? ? ?HPI was taken from Dr. Tobie Poet: ?[name] is a 71 year old male with history of tobacco dependence, depression, anxiety, COPD, hypertension, GERD, chronic respiratory failure on 4 to 5 L nasal cannula at home, who presents emergency department for chief concerns of worsening shortness of breath for 2 weeks. ?Initial vitals in the emergency department showed temperature of 97.3, respiration rate of 25, heart rate of 102, blood pressure 119/76, SPO2 of 98% initially on BiPAP. ? ?At bedside, patient's SPO2 is improved at 97 to 100% on 8 L of high flow nasal cannula. ? ?Serum sodium is 131, potassium 3.8, chloride 86, bicarb of 33, BUN of 14, serum creatinine of 0.48, GFR greater than 60, nonfasting blood glucose 73, WBC elevated at 15.4, hemoglobin 12, platelets of 462.  COVID/influenza A/influenza B PCR were negative ?  ?Portable chest x-ray in the emergency department was read as interval increase with right pleural effusion. ?  ?CTA of the chest for PE with and without contrast was read as: Marked severity emphysematous lung disease with marked severity of the right lower lobe consolidation.  Large partially loculated right-sided pleural effusion.  New 12 mm x 6 mm spiculated lung nodule versus area of focal scarring within the posterior aspect of the left lower lobe. ? ?ED treatment: DuoNeb x2, Solu-Medrol 125 mg IV one-time dose, aztreonam and vancomycin. ?  ?At bedside he is able to tell me his full name, age, he knows she is in the hospital. ? ?Reports that he has been feeling shortness of breath for the last 2 weeks.  He states that he has had a worsening cough that is productive of dark yellow sputum.  He denies known sick contacts.  He denies any fever.  He endorses nausea and denies vomiting.  He denies chest pain, abdominal pain, dysuria, diarrhea. ?  ?He does endorse that he has lost a significant amount of weight in the last 6 months, however he is not able to tell me the specific  pounds. ?  ?Social history: He lives at home with his friend.  He endorses current tobacco use, approximately half a pack per day.  At his peak he was smoking 1.5 packs/day.  He started when he was 70 years old.  He denies EtOH and recreational drug use. ? ? Rangel Echeverri  WNI:[id number] DOB: 04/28/1951 DOA: 08/26/2021 ?PCP: [facility]  ? ? ?Assessment & Plan: ?  ?Principal Problem: ?  Acute on chronic respiratory failure with hypoxia ([place]) ?Active Problems: ?  Tobacco dependency ?  COPD, moderate ([place]) ?  Hyponatremia ?  HTN (hypertension) ?  Anemia ?  GERD (gastroesophageal reflux disease) ?  Malnutrition of moderate degree ?  Unintentional weight loss ?  Protein-calorie malnutrition, severe ?  Chronic hypoxemic respiratory failure ([facility]) ?  Left lower lobe pulmonary nodule ?  Depression ?  Sepsis due to pneumonia [facility]) ?  Pleural effusion on right ?  Severe sepsis ([place]) ? ?Assessment and Plan: ?Acute on chronic hypoxic respiratory failure: likely secondary to pneumonia & pleural effusion. S/p thoracentesis 08/29/21. Improved work of breathing today. Continue on supplemental oxygen & wean back to baseline as tolerated. Palliative care & pulmon following & recs apprec ? ?CAP:  improved work of breath after thoracentesis 08/29/21. Continue on IV vanco, levaquin, bronchodilators & encourage incentive spirometry. MRSA screen is positive. ?  ?Pleural effusion on right: w/ partial loculation. S/p thoracentesis w/ 1.2L clear yellow fluid removed. Exudative process. Cytology is pending still.  Pleural fluid cx NGTD. Improved work of breathing today but still requiring oxygen  ?  ?Sepsis: met criteria w/ tachycardia, tachypnea, & likely pneumonia. Continue on IV levaquin, vanco. Procal <0.10  ? ?Unlikely pericardial effusion: small as per CTA chest. Repeat echo shows no evidence of pericardial effusion, EF 70-75%, no regional wall motion abnormalities  ?  ?Depression: severity unknown. Continue on  duloxetine  ?  ?Left lower lobe pulmonary nodule: on CTA of chest on 08/26/2021, described as spiculated, 12 x 6 mm. High risk of lung cancer as pt has been smoking since he was 71 y/o. Will need outpatient f/u and pt is aware  ? ?Severe protein-calorie malnutrition: continue on nutritional supplements  ? ?Unintentional weight loss: etiology unclear.  ? ?Thrombocytosis: etiology unclear, labile. Will continue to monitor  ? ?Leukocytosis: likely secondary to above infection. Continue on abxs  ? ?Normocytic anemia: H&H are stable   ? ?GERD: continue on PPI  ?  ?HTN: hold home anti-HTN meds as BP was low normal  ? ?Hyponatremia: stable from day prior.  ?  ?COPD exacerbation: continue on abxs, bronchodilators & encourage incentive spirometry  ?  ?Tobacco dependency: received smoking cessation counseling  ? ? ? ? ? ? ?DVT prophylaxis: heparin  ?Code Status:  full. Discussed code status w/ pt on 4/24 and pt wanted to stay full code ?Family Communication: called pt's friend, [name], no answer & unable to leave a voicemail  ?Disposition Plan: depends on PT/OT recs  ? ?Level of care: Stepdown ? ?Status is: Inpatient ?Remains inpatient appropriate because: still requiring IV abxs   ? ? ? ?Consultants:  ?Palliative care ?Pulmon  ? ?Procedures:  ? ?Antimicrobials: levaquin, vano  ? ? ?Subjective: ?Pt c/o shortness of breath, improved from day prior.  ? ?Objective: ?Vitals:  ? 08/30/21 0418 08/30/21 0600 08/30/21 0755 08/30/21 0800  ?BP: (!) 102/54 (!) 94/55  (!) 117/58  ?Pulse:  79  86  ?Resp:  10  17  ?Temp:    98.4 ?F (36.9 ?C)  ?TempSrc:    Oral  ?SpO2:   98% 99%  ?Weight:      ?Height:      ? ? ?Intake/Output Summary (Last 24 hours) at 08/30/2021 0835 ?Last data filed at 08/30/2021 0541 ?Gross per 24 hour  ?Intake 740.47 ml  ?Output 3200 ml  ?Net -2459.53 ml  ? ?Filed Weights  ? 08/26/21 1727 08/27/21 0100  ?Weight: 72.6 kg 63.6 kg  ? ? ?Examination: ? ?General exam: Appears comfortable. Cachetic  ?Respiratory system:  decreased breath sounds b/l ?Cardiovascular system: S1/S2+. No rubs or gallops  ?Gastrointestinal system: Abd is soft, NT, ND & hypoactive bowel sounds   ?Central nervous system: alert and oriented. Moves all extremities   ?Psychiatry: Judgement and insight appears poor. Flat mood and affect  ? ? ? ?Data Reviewed: I have personally reviewed following labs and imaging studies ? ?CBC: ?Recent Labs  ?Lab 08/26/21 ?1733 08/27/21 ?3567 08/28/21 ?0423 08/29/21 ?0141 08/30/21 ?0301  ?WBC 15.4* 7.0 15.8* 13.7* 11.9*  ?NEUTROABS 12.6*  --   --   --   --   ?HGB 12.0* 10.7* 10.8* 11.2* 11.2*  ?HCT 38.5* 33.6* 35.2* 37.0* 36.1*  ?MCV 87.5 85.3 88.0 90.2 88.5  ?PLT 462* 412* 405* 398 403*  ? ?Basic Metabolic Panel: ?Recent Labs  ?Lab 08/26/21 ?1733 08/27/21 ?0254 08/27/21 ?3143 08/28/21 ?0423 08/29/21 ?8887 08/30/21 ?5797  ?NA 131*  --  130* 133* 131* 131*  ?K 3.8  --  4.0 4.1  4.3 3.8  ?CL 86*  --  87* 90* 91* 86*  ?CO2 33*  --  36* 37* 33* 38*  ?GLUCOSE 73  --  226* 103* 103* 248*  ?BUN 14  --  14 19 19 23   ?CREATININE 0.48* 0.49* 0.39* 0.39* 0.60* 0.87  ?CALCIUM 8.8*  --  8.4* 8.6* 8.5* 8.5*  ?MG  --   --   --   --   --  1.5*  ?PHOS  --   --   --   --   --  3.4  ? ?GFR: ?Estimated Creatinine Clearance: 71.1 mL/min (by C-G formula based on SCr of 0.87 mg/dL). ?Liver Function Tests: ?No results for input(s): AST, ALT, ALKPHOS, BILITOT, PROT, ALBUMIN in the last 168 hours. ?No results for input(s): LIPASE, AMYLASE in the last 168 hours. ?No results for input(s): AMMONIA in the last 168 hours. ?Coagulation Profile: ?No results for input(s): INR, PROTIME in the last 168 hours. ?Cardiac Enzymes: ?No results for input(s): CKTOTAL, CKMB, CKMBINDEX, TROPONINI in the last 168 hours. ?BNP (last 3 results) ?No results for input(s): PROBNP in the last 8760 hours. ?HbA1C: ?No results for input(s): HGBA1C in the last 72 hours. ?CBG: ?Recent Labs  ?Lab 08/27/21 ?2763  ?GLUCAP 94  ? ?Lipid Profile: ?No results for input(s): CHOL, HDL, LDLCALC,  TRIG, CHOLHDL, LDLDIRECT in the last 72 hours. ?Thyroid Function Tests: ?No results for input(s): TSH, T4TOTAL, FREET4, T3FREE, THYROIDAB in the last 72 hours. ?Anemia Panel: ?No results for input(s): VITAMINB12,

## 2021-08-31 ENCOUNTER — Inpatient Hospital Stay: Payer: Medicare HMO

## 2021-08-31 DIAGNOSIS — Z7189 Other specified counseling: Secondary | ICD-10-CM | POA: Diagnosis not present

## 2021-08-31 DIAGNOSIS — J9621 Acute and chronic respiratory failure with hypoxia: Secondary | ICD-10-CM | POA: Diagnosis not present

## 2021-08-31 DIAGNOSIS — Z515 Encounter for palliative care: Secondary | ICD-10-CM | POA: Diagnosis not present

## 2021-08-31 LAB — CBC
HCT: 35.6 % — ABNORMAL LOW (ref 39.0–52.0)
Hemoglobin: 11 g/dL — ABNORMAL LOW (ref 13.0–17.0)
MCH: 27.3 pg (ref 26.0–34.0)
MCHC: 30.9 g/dL (ref 30.0–36.0)
MCV: 88.3 fL (ref 80.0–100.0)
Platelets: 389 10*3/uL (ref 150–400)
RBC: 4.03 MIL/uL — ABNORMAL LOW (ref 4.22–5.81)
RDW: 17.1 % — ABNORMAL HIGH (ref 11.5–15.5)
WBC: 17.3 10*3/uL — ABNORMAL HIGH (ref 4.0–10.5)
nRBC: 0 % (ref 0.0–0.2)

## 2021-08-31 LAB — BASIC METABOLIC PANEL
Anion gap: 5 (ref 5–15)
BUN: 31 mg/dL — ABNORMAL HIGH (ref 8–23)
CO2: 45 mmol/L — ABNORMAL HIGH (ref 22–32)
Calcium: 8.9 mg/dL (ref 8.9–10.3)
Chloride: 86 mmol/L — ABNORMAL LOW (ref 98–111)
Creatinine, Ser: 0.72 mg/dL (ref 0.61–1.24)
GFR, Estimated: >60 mL/min (ref >60)
Glucose, Bld: 107 mg/dL — ABNORMAL HIGH (ref 70–99)
Potassium: 4.3 mmol/L (ref 3.5–5.1)
Sodium: 136 mmol/L (ref 135–145)

## 2021-08-31 LAB — PHOSPHORUS: Phosphorus: 3.3 mg/dL (ref 2.5–4.6)

## 2021-08-31 LAB — TROPONIN I (HIGH SENSITIVITY): Troponin I (High Sensitivity): 17 ng/L (ref <18)

## 2021-08-31 LAB — MAGNESIUM: Magnesium: 2.3 mg/dL (ref 1.7–2.4)

## 2021-08-31 LAB — VANCOMYCIN, RANDOM: Vancomycin Rm: 15

## 2021-08-31 MED ORDER — LEVALBUTEROL HCL 0.63 MG/3ML IN NEBU
0.6300 mg | INHALATION_SOLUTION | Freq: Four times a day (QID) | RESPIRATORY_TRACT | Status: DC
  Administered 2021-08-31 – 2021-09-07 (×24): 0.63 mg via RESPIRATORY_TRACT
  Filled 2021-08-31 (×30): qty 3

## 2021-08-31 MED ORDER — NITROGLYCERIN 0.4 MG SL SUBL
0.4000 mg | SUBLINGUAL_TABLET | SUBLINGUAL | Status: DC | PRN
  Administered 2021-08-31: 0.4 mg via SUBLINGUAL
  Filled 2021-08-31: qty 1

## 2021-08-31 MED ORDER — VANCOMYCIN HCL 750 MG/150ML IV SOLN
750.0000 mg | Freq: Once | INTRAVENOUS | Status: AC
  Administered 2021-08-31: 750 mg via INTRAVENOUS
  Filled 2021-08-31: qty 150

## 2021-08-31 MED ORDER — PREDNISONE 20 MG PO TABS
40.0000 mg | ORAL_TABLET | Freq: Every day | ORAL | Status: DC
Start: 2021-09-01 — End: 2021-09-06
  Administered 2021-09-01 – 2021-09-06 (×6): 40 mg via ORAL
  Filled 2021-08-31 (×6): qty 2

## 2021-08-31 MED ORDER — LEVOFLOXACIN 500 MG PO TABS
750.0000 mg | ORAL_TABLET | Freq: Every day | ORAL | Status: AC
  Administered 2021-09-01 – 2021-09-05 (×5): 750 mg via ORAL
  Filled 2021-08-31 (×5): qty 2

## 2021-08-31 MED ORDER — LINEZOLID 600 MG PO TABS
600.0000 mg | ORAL_TABLET | Freq: Two times a day (BID) | ORAL | Status: AC
  Administered 2021-09-01 – 2021-09-05 (×10): 600 mg via ORAL
  Filled 2021-08-31 (×10): qty 1

## 2021-08-31 NOTE — Progress Notes (Signed)
Patient complains of 8/10 chest and back pain. Notified MD. EKG completed.  ?Era Bumpers, RN  ?

## 2021-08-31 NOTE — Care Management Important Message (Signed)
Important Message ? ?Patient Details  ?Name: Jeremiah [name] ?MRN: [id number] ?Date of Birth: 1950/08/28 ? ? ?Medicare Important Message Given:  Yes ? ?Patient aware of Medicare IM right to appeal.  Declined copy at this time. ? ? ? ?Dannette Barbara ?08/31/2021, 11:50 AM ?

## 2021-08-31 NOTE — Consult Note (Addendum)
? ?NAME:  Jeremiah [name], MRN:  [id number], DOB:  06-01-1950, LOS: 5 ?ADMISSION DATE:  08/26/2021, CONSULTATION DATE:  08/29/2021 ?REFERRING MD:  Dr. Jimmye Norman, CHIEF COMPLAINT:  Acute on chronic Hypoxic respiratory failure  ? ?Brief Pt Description / Synopsis:  ?71 y.o. Male with PMH significant for COPD requiring 4L supplemental O2 at baseline admitted with Acute on Chronic Hypoxic Respiratory Failure in the setting of AECOPD, Community Acquired Pneumonia, large (partially loculated) Right Pleural Effusion.  Also found to have new LLL spiculated lung nodule. ? ?History of Present Illness:  ?Jeremiah [name] is a 71 year old male with a past medical history significant for COPD requiring 4 to 5 L supplemental O2 at home, tobacco abuse, hypertension, anxiety, depression, GERD who presented to [facility] ED on 08/26/2021 due to complaints of worsening shortness of breath for approximately 2 weeks. ? ?He also reported worsening cough that was productive of dark yellow sputum.  He also endorses that he is lost a significant mount of weight in the last 6 months, unable to provide a specific amount of pounds.  He denied fever, chills, nausea, vomiting, chest pain, abdominal pain, dysuria. ? ?ED Course: ?Initial Vital Signs: Temperature 97.3 ?F, respiratory rate 25, pulse 102, blood pressure 119/76, SPO2 98% on BiPAP ?Significant Labs: Serum sodium is 131, potassium 3.8, chloride 86, bicarb of 33, BUN of 14, serum creatinine of 0.48, GFR greater than 60, nonfasting blood glucose 73, WBC elevated at 15.4, hemoglobin 12, platelets of 462.  COVID/influenza A/influenza B PCR were negative ?Imaging ?Chest X-ray>>Interval increase of an at least small volume right pleural effusion. Aortic Atherosclerosis (ICD10-I70.0) and Emphysema  ?CTA Chest>>Marked severity emphysematous lung disease with marked severity right lower lobe consolidation. Large partially loculated right-sided pleural effusion. Fracture deformities involving the superior  endplates of the T8 and T11 vertebral bodies, new when compared to the prior exam. New 12 mm x 6 mm spiculated lung nodule versus area of focal scarring within the posterior aspect of the left lower lobe. Correlation with follow-up PET-CT or tissue sampling is recommended. This recommendation follows the consensus statement: Guidelines for Management of Incidental Pulmonary Nodules Detected on CT Images: From the Fleischner Society 2017; Radiology 2017; 284:228-243. Small, approximately 11 mm thick anterior pericardial effusion. Coronary artery disease. ?Medications Administered: DuoNeb x2, Solu-Medrol 125 mg IV one-time dose, aztreonam and vancomycin. ? ?He was admitted by the hospitalist for further work-up and treatment of acute on chronic hypoxic respiratory failure. ? ?Please see "Significant Hospital Events" section below for full detailed hospital course. ?Pertinent  Medical History  ? ?Past Medical History:  ?Diagnosis Date  ? Acute and chronic respiratory failure (acute-on-chronic) ([place]) 10/18/2020  ? Asthma   ? COPD (chronic obstructive pulmonary disease) ([place])   ? O2 dependent - 2L  ? GERD (gastroesophageal reflux disease)   ? HTN (hypertension)   ? Multifocal pneumonia 06/16/2020  ? Other emphysema ([place]) 08/22/2016  ? Oxygen dependent   ? Sepsis ([place]) 05/14/2020  ? Tobacco dependence   ? ? ?Micro Data:  ?4/21: SARS-CoV-2 and influenza PCR>>negative ?4/22: Blood culture x2>>no growth to date ?4/22: MRSA PCR>>positive ?4/24: Legionella species culture>> ?4/24: Body fluid>>negative  ? ?Antimicrobials:  ?Cefepime 4/21 x 1 dose ?Aztreonam 4/21>>4/22 ?Levofloxacin 4/21>>4/25 ?Vancomycin 4/21>> ? ?Significant Hospital Events: ?Including procedures, antibiotic start and stop dates in addition to other pertinent events   ?4/21: Admitted by the hospitalist for commune acquired pneumonia and COPD exacerbation ?4/23: Increased oxygen demand, respiratory rate, desaturations with minimal movement.  Was started on IV  Lasix  twice daily.  Palliative care consulted.  IR consulted for thoracentesis ?4/24: PCCM asked to see for pulmonary consult given accessory muscle use and high risk for decompensation.  Plan for thoracentesis by IR today. ?4/24: Right-sided thoracentesis removal of 1.2L clear yellow fluid  ? ?Interim History / Subjective:  ?Pt still c/o shortness of breath with wheezes.   ? ?Objective   ?Blood pressure 111/62, pulse 81, temperature 97.6 ?F (36.4 ?C), resp. rate (!) 21, height 6' (1.829 m), weight 63.6 kg, SpO2 98 %. ?   ?   ? ?Intake/Output Summary (Last 24 hours) at 08/31/2021 0956 ?Last data filed at 08/31/2021 0900 ?Gross per 24 hour  ?Intake 580 ml  ?Output 890 ml  ?Net -310 ml  ? ?Filed Weights  ? 08/26/21 1727 08/27/21 0100  ?Weight: 72.6 kg 63.6 kg  ? ? ?Examination: ?General: Acute on chronically ill-appearing, cachetic male, laying in bed, resting comfortably, NAD ?HENT: Atraumatic, normocephalic, burn present on tip of nose, neck supple, no JVD ?Lungs: Diminished throughout, mild tachypnea worse with exertion  ?Cardiovascular: Regular rate and rhythm, S1-S2, no M/R/G ?Abdomen: Soft, mild tenderness, non distended, bowel sounds positive x4 ?Extremities: Generalized weakness, trace bilateral lower extremity edema  ?Neuro: Alert and oriented, follows commands, PERRLA ?GU: External catheter in place draining yellow urine  ? ?Resolved Hospital Problem list   ? ? ?Assessment & Plan:  ? ?Acute on Chronic Hypoxic Respiratory Failure in the setting of AECOPD, Community Acquired Pneumonia, large (partially loculated) Right Pleural Effusion s/p right-sided thoracentesis 04/25 ?New LLL spiculated lung nodule ?-Supplemental O2 as needed to maintain O2 sats 88 to 92% ?-BiPAP if needed ?-Follow intermittent Chest X-ray & ABG as needed ?-Bronchodilators & Pulmicort nebs ?-IV Steroids (continue solumedrol 40 mg IV daily) ?-ABX as above ?-Hold iv lasix due to alkalosis if pt needs to diurese give diamox instead  ?-Pulmonary  toilet as able ?-Will need outpatient work up of nodule once clinically stable ?-Encourage smoking cessation ? ?Community Acquired Pneumonia ?-Monitor fever curve ?-Trend WBC's & Procalcitonin ?-Follow cultures as above ?-Continue abx as outlined above  ?-Repeat CXR in the am  ? ?Patient is acutely ill-appearing, superimposed on chronic comorbidities most notably for severe COPD requiring 4 to 5 L supplemental oxygen at baseline.  Prognosis is extremely guarded, high risk for decompensation requiring intubation, cardiac arrest and death.  Recommend DNR/DNI status.  Palliative care is consulted for assistance with further goals of care discussions. ?Best Practice (right click and "Reselect all SmartList Selections" daily)  ? ?Diet/type: Regular consistency (see orders) ?DVT prophylaxis: SCD: lovenox  ?GI prophylaxis: N/A ?Lines: N/A ?Foley:  N/A ?Code Status:  full code ?Last date of multidisciplinary goals of care discussion [N/A] ? ?Labs   ?CBC: ?Recent Labs  ?Lab 08/26/21 ?1733 08/27/21 ?6063 08/28/21 ?0423 08/29/21 ?0160 08/30/21 ?1093 08/31/21 ?2355  ?WBC 15.4* 7.0 15.8* 13.7* 11.9* 17.3*  ?NEUTROABS 12.6*  --   --   --   --   --   ?HGB 12.0* 10.7* 10.8* 11.2* 11.2* 11.0*  ?HCT 38.5* 33.6* 35.2* 37.0* 36.1* 35.6*  ?MCV 87.5 85.3 88.0 90.2 88.5 88.3  ?PLT 462* 412* 405* 398 403* 389  ? ? ?Basic Metabolic Panel: ?Recent Labs  ?Lab 08/27/21 ?7322 08/28/21 ?0423 08/29/21 ?0254 08/30/21 ?2706 08/31/21 ?2376  ?NA 130* 133* 131* 131* 136  ?K 4.0 4.1 4.3 3.8 4.3  ?CL 87* 90* 91* 86* 86*  ?CO2 36* 37* 33* 38* 45*  ?GLUCOSE 226* 103* 103* 248* 107*  ?  BUN 14 19 19 23  31*  ?CREATININE 0.39* 0.39* 0.60* 0.87 0.72  ?CALCIUM 8.4* 8.6* 8.5* 8.5* 8.9  ?MG  --   --   --  1.5* 2.3  ?PHOS  --   --   --  3.4 3.3  ? ?GFR: ?Estimated Creatinine Clearance: 77.3 mL/min (by C-G formula based on SCr of 0.72 mg/dL). ?Recent Labs  ?Lab 08/27/21 ?1224 08/27/21 ?0254 08/27/21 ?4975 08/28/21 ?0423 08/29/21 ?3005 08/30/21 ?1102 08/31/21 ?1117   ?PROCALCITON <0.10  --   --   --   --   --   --   ?WBC  --   --    < > 15.8* 13.7* 11.9* 17.3*  ?LATICACIDVEN  --  0.9  --   --   --   --   --   ? < > = values in this interval not displayed.  ? ? ?Liver Funct

## 2021-08-31 NOTE — Assessment & Plan Note (Addendum)
--  MRSA screen pos.  ?--completed 10 day abx with Levaquin and vanc/Linezlid, per pulm rec ?

## 2021-08-31 NOTE — Progress Notes (Addendum)
Pharmacy Antibiotic Note ? ?Jeremiah [name] is a 71 y.o. male admitted on 08/26/2021 with sepsis.  Pharmacy has been consulted for levofloxacin and vancomycin dosing. ? ?Patient with documented penicillin allergy but has tolerated multiple doses of ceftriaxone and previously tolerated cefepime as well.  ? ?Plan: ? ?Completed 5 days of levofloxacin 750 mg PO daily today ? ?Vancomycin random this AM = 15 mcg/mL. Will give vancomycin 750 mg IV x 1 dose to complete therapy per discussion with MD ? ?Height: 6' (182.9 cm) ?Weight: 63.6 kg (140 lb 3.4 oz) ?IBW/kg (Calculated) : 77.6 ? ?Temp (24hrs), Avg:97.8 ?F (36.6 ?C), Min:97.5 ?F (36.4 ?C), Max:98.6 ?F (37 ?C) ? ?Recent Labs  ?Lab 08/27/21 ?0254 08/27/21 ?8185 08/28/21 ?0423 08/28/21 ?1123 08/28/21 ?2048 08/29/21 ?0409 08/30/21 ?9093 08/31/21 ?1121  ?WBC  --  7.0 15.8*  --   --  13.7* 11.9* 17.3*  ?CREATININE 0.49* 0.39* 0.39*  --   --  0.60* 0.87 0.72  ?LATICACIDVEN 0.9  --   --   --   --   --   --   --   ?[place]  --   --   --   --  21*  --   --   --   ?VANCOPEAK  --   --   --  34  --   --   --   --   ?VANCORANDOM  --   --   --   --   --   --  26 15  ? ?  ?Estimated Creatinine Clearance: 77.3 mL/min (by C-G formula based on SCr of 0.72 mg/dL).   ? ?Allergies  ?Allergen Reactions  ? Penicillins Anaphylaxis, Swelling and Other (See Comments)  ?  Tolerated Ceftriaxone 06/2020 ?05/2020 childhood reaction and his mother told him that he "swelled up." ? ?Had a PCN reaction causing immediate rash, facial/tongue/throat swelling, SOB or lightheadedness with hypotension: Yes ?Had a PCN reaction causing severe rash involving mucus membranes or skin necrosis: No ?Had a PCN reaction that required hospitalization No ?Had a PCN reaction occurring within the last 10 years: No ?If all the above answers are "NO", may proceed with Cephalosporin use.  ? ? ?Antimicrobials this admission: ?Aztreonam 4/22 x 1 ?Vancomycin 4/22 >> 4/26 ?Levaquin 4/22 >> 4/26 ? ?Dose adjustments this  admission: ? ?Microbiology results: ?4/19 BCx: NGTD ?4/22 MRSA PCR: (+) ?4/24 Pleural Cx: NGTD ? ?Thank you for allowing pharmacy to be a part of this pleasant patient?s care. ? ?Benita Gutter ?08/31/2021 ?10:02 AM ? ?

## 2021-08-31 NOTE — Evaluation (Signed)
Occupational Therapy Evaluation ?Patient Details ?Name: Jeremiah [name] ?MRN: [id number] ?DOB: 1951/04/13 ?Today's Date: 08/31/2021 ? ? ?History of Present Illness Mr. Barbara Keng is a 71 year old male with history of tobacco dependence, depression, anxiety, COPD, hypertension, GERD, chronic respiratory failure on 4 to 5 L nasal cannula at home, who presents emergency department for chief concerns of worsening shortness of breath for 2 weeks.  ? ?Clinical Impression ?  ? Mr. Garis presents with generalized weakness, reduced endurance, impaired balance, and 8-9/10 pain. PTA, he lives in a mobile home with a roommate who assists him with care PRN, uses a RW, sponge bathes, is able to dress INDly, uses 4L O2. He has 10 STE his home, saying that, subsequently, he rarely leaves his house. He does not drive. During today's evaluation, pt reports he feels so short of breath that he does not want to move. Supine in bed on 4L O2, oxygen sats 87%, HR 105. Following transfer to EOB, with rest break during the move, O2 drops to 82%, HR increases to 114. Pt declines further participation, requests returning to supine but is unable to elevate LE, needs Mod A. Provided educ re: deep, slow breathing techniques. Increased O2 to 5L. Pt's sats up to 90% within 3-4 minutes. Despite receiving tramadol 90 minutes before session, pt reports 8-9/10 pain. Nursing notified. Given pt's current level of debility, recommend DC to SNF; however, pt states he is interested only in returning home, stating that his housemate will be able to assist him with any care needs.   ? ?Recommendations for follow up therapy are one component of a multi-disciplinary discharge planning process, led by the attending physician.  Recommendations may be updated based on patient status, additional functional criteria and insurance authorization.  ? ?Follow Up Recommendations ? Skilled nursing-short term rehab (<3 hours/day)  ?  ?Assistance Recommended at Discharge  Frequent or constant Supervision/Assistance  ?Patient can return home with the following A lot of help with walking and/or transfers;A lot of help with bathing/dressing/bathroom;Assistance with cooking/housework;Help with stairs or ramp for entrance;Assist for transportation ? ?  ?Functional Status Assessment ? Patient has had a recent decline in their functional status and demonstrates the ability to make significant improvements in function in a reasonable and predictable amount of time.  ?Equipment Recommendations ? None recommended by OT  ?  ?Recommendations for Other Services   ? ? ?  ?Precautions / Restrictions Precautions ?Precautions: None ?Precaution Comments: mod fall risk ?Restrictions ?Weight Bearing Restrictions: No  ? ?  ? ?Mobility Bed Mobility ?Overal bed mobility: Needs Assistance ?Bed Mobility: Supine to Sit, Sit to Supine ?  ?  ?Supine to sit: Min assist ?Sit to supine: Mod assist ?  ?General bed mobility comments: Requires Mod A for elevating LE for sit to supine ?  ? ?Transfers ?Overall transfer level: Needs assistance ?  ?  ?  ?  ?  ?  ?  ?  ?General transfer comment: Unable, 2/2 SOB ?  ? ?  ?Balance Overall balance assessment: Needs assistance ?Sitting-balance support: Feet supported, Bilateral upper extremity supported ?Sitting balance-Leahy Scale: Fair ?  ?Postural control: Posterior lean ?  ?Standing balance-Leahy Scale: Zero ?Standing balance comment: Unable, 2/2 SOB ?  ?  ?  ?  ?  ?  ?  ?  ?  ?  ?  ?   ? ?ADL either performed or assessed with clinical judgement  ? ?ADL Overall ADL's : Needs assistance/impaired ?  ?  ?  ?  ?  ?  ?  ?  ?  ?  ?  ?  ?  ?  ?  ?  ?  ?  ?  ?  General ADL Comments: Pt requiring Mod/Max A for all OOB mobility, 2/2 SOB  ? ? ? ?Vision   ?   ?   ?Perception   ?  ?Praxis   ?  ? ?Pertinent Vitals/Pain Pain Assessment ?Pain Assessment: 0-10 ?Pain Score: 9  ?Pain Location: chest + along spinal cord ?Pain Descriptors / Indicators: Aching ?Pain Intervention(s): Limited  activity within patient's tolerance, Monitored during session, Repositioned  ? ? ? ?Hand Dominance Right ?  ?Extremity/Trunk Assessment Upper Extremity Assessment ?Upper Extremity Assessment: Generalized weakness ?  ?Lower Extremity Assessment ?Lower Extremity Assessment: Generalized weakness ?  ?  ?  ?Communication Communication ?Communication: No difficulties ?  ?Cognition Arousal/Alertness: Awake/alert ?Behavior During Therapy: [facility] for tasks assessed/performed ?Overall Cognitive Status: Within Functional Limits for tasks assessed ?  ?  ?  ?  ?  ?  ?  ?  ?  ?  ?  ?  ?  ?  ?  ?  ?  ?  ?  ?General Comments  [place], ill-appearing ? ?  ?Exercises Other Exercises ?Other Exercises: Educ re: deep breathing, relaxation techniques; energy conservation strategies ?  ?Shoulder Instructions    ? ? ?Home Living Family/patient expects to be discharged to:: Private residence ?Living Arrangements: Non-relatives/Friends ?Available Help at Discharge: Friend(s);Available PRN/intermittently ?Type of Home: Mobile home ?Home Access: Stairs to enter ?Entrance Stairs-Number of Steps: 10 at front with wideset bilat rails; 2 at back door, but unable to access back door ?Entrance Stairs-Rails: Right;Left ?Home Layout: One level ?  ?  ?Bathroom Shower/Tub: Walk-in shower;Sponge bathes at baseline ?  ?Bathroom Toilet: Standard ?  ?  ?Home Equipment: BSC/3in1;Rolling Walker (2 wheels) ?  ?  ?  ? ?  ?Prior Functioning/Environment Prior Level of Function : Needs assist ?  ?  ?  ?  ?  ?  ?Mobility Comments: Uses RW at home, [facility] w/in community (health care appts). Reports rarely leaves home ?ADLs Comments: Sponge bathes at baseline. Uses BSC over toilet, 4L O2 at baseline. ?  ? ?  ?  ?OT Problem List: Decreased strength;Decreased activity tolerance;Cardiopulmonary status limiting activity;Impaired balance (sitting and/or standing);Pain ?  ?   ?OT Treatment/Interventions: Self-care/ADL training;Therapeutic exercise;Patient/family education;Balance  training;Energy conservation;Therapeutic activities  ?  ?OT Goals(Current goals can be found in the care plan section) Acute Rehab OT Goals ?Patient Stated Goal: to go home ?OT Goal Formulation: With patient ?Time For Goal Achievement: 09/14/21 ?Potential to Achieve Goals: Good ?ADL Goals ?Pt Will Perform Grooming: with supervision;sitting ?Pt Will Perform Lower Body Dressing: with supervision;sitting/lateral leans ?Pt Will Transfer to Toilet: with modified independence;stand pivot transfer (using LRAD) ?Additional ADL Goal #1: pt will identify/demonstrate 2+ energy conservation strategies  ?OT Frequency: Min 2X/week ?  ? ?Co-evaluation   ?  ?  ?  ?  ? ?  ?AM-PAC OT "6 Clicks" Daily Activity     ?Outcome Measure Help from another person eating meals?: None ?Help from another person taking care of personal grooming?: A Lot ?Help from another person toileting, which includes using toliet, bedpan, or urinal?: A Lot ?Help from another person bathing (including washing, rinsing, drying)?: A Lot ?Help from another person to put on and taking off regular upper body clothing?: A Lot ?Help from another person to put on and taking off regular lower body clothing?: A Lot ?6 Click Score: 14 ?  ?End of Session Equipment Utilized During Treatment: Rolling walker (2 wheels) ? ?Activity Tolerance: Other (comment) (Pt limited by dyspnea) ?Patient left: in bed;with call  bell/phone within reach;with bed alarm set ? ?OT Visit Diagnosis: Unsteadiness on feet (R26.81);Muscle weakness (generalized) (M62.81)  ?              ?Time: 9563-8756 ?OT Time Calculation (min): 17 min ?Charges:  OT General Charges ?$OT Visit: 1 Visit ?OT Evaluation ?$OT Eval Moderate Complexity: 1 Mod ?OT Treatments ?$Self Care/Home Management : 8-22 mins ?Josiah Lobo, PhD, MS, OTR/L ?08/31/21, 12:52 PM ? ?

## 2021-08-31 NOTE — Progress Notes (Signed)
Palliative: ?Mr. Elray Buba, is sitting up in the bed in his room.  He greets me, making and mostly keeping eye contact.  He appears acutely/chronically ill and very frail.  Work of breathing noted.  Mr. Leichter is alert and oriented, able to make his basic needs known.  There is no family at bedside at this time. ? ?We talk about his acute health concerns.  I shared that it is good news that he has been transferred out of the intensive care.  Mr. Butrick tells me that his oxygen levels have dropped.  We talk about working closely with respiratory therapy and he shares that they are "in communication".  We talked about his work of breathing and the benefits of low-dose by mouth morphine.  Mr. Ellerman states, "what, are you trying to kill me?".  I shared that absolutely not, morphine can be of benefit for dyspnea.  He rolls his eyes.  Mr. Stefanelli is unwilling/unable to continue meaningful discussions at this point. ? ?Conference with attending, bedside nursing staff, transition of care team related to patient condition, needs, disposition. ? ?Plan:   At this point full scope/full code.  Time for outcomes.  Would benefit from morphine for dyspnea but resistant. ? ?25 minutes ?Quinn Axe, NP ?Palliative medicine team ?Team phone [phone] ?Greater than 50% of this time was spent counseling and coordinating care related to the above assessment and plan. ?

## 2021-08-31 NOTE — TOC Progression Note (Signed)
Transition of Care (TOC) - Progression Note  ? ? ?Patient Details  ?Name: Jeremiah [name] ?MRN: [id number] ?Date of Birth: Sep 06, 1950 ? ?Transition of Care (TOC) CM/SW Contact  ?Alberteen Sam, LCSW ?Phone Number: ?08/31/2021, 11:53 AM ? ?Clinical Narrative:    ? ?Patient reports he wants [facility] go [facility] with [facility] and declines SNF, patient is active with [facility] and has O2 at home through [facility].  ? ?Patient wishes to remain in hospital a few more day so he can dc home, as he refuses to go to SNF at this time which he is aware is recommended.  ? ?Expected Discharge Plan: [place] ?Barriers to Discharge: Continued Medical Work up ? ?Expected Discharge Plan and Services ?Expected Discharge Plan: [place] ?  ?Discharge Planning Services: CM Consult ?Post Acute Care Choice: Home Health ?Living arrangements for the past 2 months: Mobile Home ?                ?DME Arranged: N/A ?DME Agency: NA ?  ?  ?  ?HH Arranged: RN, PT, OT ?[place] Agency: [facility] ?Date HH Agency Contacted: 08/29/21 ?Time [place]: 6789 ?Representative spoke with at [place]: Juanda Crumble ? ? ?Social Determinants of Health (SDOH) Interventions ?  ? ?Readmission Risk Interventions ? ?  08/29/2021  ?  2:00 PM 10/21/2020  ?  1:14 PM 09/27/2020  ? 11:18 AM  ?Readmission Risk Prevention Plan  ?Transportation Screening Complete Complete Complete  ?PCP or Specialist Appt within 3-5 Days  Complete Complete  ?[place] or Home Care Consult  Complete Complete  ?Social Work Consult for [place] Planning/Counseling  Complete Not Complete  ?SW consult not completed comments   RNCM assigned to patient  ?Palliative Care Screening  Not Applicable Not Applicable  ?Medication Review Press photographer) Complete Complete Complete  ?PCP or Specialist appointment within 3-5 days of discharge Complete    ?[place] or Home Care Consult Complete    ?SW Recovery Care/Counseling Consult Complete    ?Palliative Care Screening Not  Applicable    ?[place] Not Complete    ?SNF Comments waiting on PT recomendation    ? ? ?

## 2021-08-31 NOTE — Progress Notes (Signed)
?Progress Note ? ? ?Patient: Jeremiah [name] VWP:[id number] DOB: May 28, 1950 DOA: 08/26/2021     5 ?DOS: the patient was seen and examined on 08/31/2021 ?  ?Brief hospital course: ?[name] is a 71 year old male with history of tobacco dependence, depression, anxiety, COPD, hypertension, GERD, chronic respiratory failure on 4 to 5 L nasal cannula at home, who presents emergency department for chief concerns of worsening shortness of breath for 2 weeks. ?Initial vitals in the emergency department showed temperature of 97.3, respiration rate of 25, heart rate of 102, blood pressure 119/76, SPO2 of 98% initially on BiPAP. ? ?At bedside, patient's SPO2 is improved at 97 to 100% on 8 L of high flow nasal cannula. ? ?Serum sodium is 131, potassium 3.8, chloride 86, bicarb of 33, BUN of 14, serum creatinine of 0.48, GFR greater than 60, nonfasting blood glucose 73, WBC elevated at 15.4, hemoglobin 12, platelets of 462.  COVID/influenza A/influenza B PCR were negative ? ?Portable chest x-ray in the emergency department was read as interval increase with right pleural effusion. ? ?CTA of the chest for PE with and without contrast was read as: Marked severity emphysematous lung disease with marked severity of the right lower lobe consolidation.  Large partially loculated right-sided pleural effusion.  New 12 mm x 6 mm spiculated lung nodule versus area of focal scarring within the posterior aspect of the left lower lobe. ? ?ED treatment: DuoNeb x2, Solu-Medrol 125 mg IV one-time dose, aztreonam and vancomycin. ? ?Assessment and Plan: ?* Acute on chronic respiratory failure with hypoxia (HCC) ?--on home 4-5L O2 at baseline.   ?- Initially on BiPAP, now transition to high flow ?- Multifactorial including right-sided loculated pleural effusion, community-acquired pneumonia, severe COPD. ?--s/p right thoracentesis ?Plan: ?--cont tx for PNA, per pulm rec ?--Continue supplemental O2 to keep sats between 88-92%, wean as  tolerated ? ?Pleural effusion on right ?- With partial loculation ?--s/p right thora with 1.2L removal ? ?Sepsis due to pneumonia [facility]) ?- Patient meeting severe sepsis criteria with increased heart rate, increased respiration rate, hypoxia requiring BiPAP on initial presentation to the ED, elevated white cell count, source of loculated pleural effusion versus pneumonia ?- Continue with Vancomycin, aztreonam, and levaquin per pharmacy ?- Blood cultures x2 ordered, lactic acid x2, procalcitonin ordered, UA ?-Check Legionella in the urine and culture ?- Admit to stepdown, inpatient ? ?Depression ?- Resumed home duloxetine 30 mg daily ? ?Left lower lobe pulmonary nodule ?- On CTA of chest on 08/26/2021, described as spiculated, 12 x 6 mm ?- Discussed extensively with patient at bedside that he will need outpatient follow-up ?- Patient endorses understanding and compliance and repeat the information and instructions to me ? ?Chronic hypoxemic respiratory failure (HCC) ?- On baseline 4 to 5 L nasal cannula ? ?CAP (community acquired pneumonia) ?--MRSA screen pos.  Completed 5 days of Levaquin and IV vanc ?Plan: ?--cont Levaquin for 5 more days ?--cont Linezolid for 5 more days ? ?Protein-calorie malnutrition, severe ?- Dietary consulted ? ?Unintentional weight loss ?- Concerning for carcinoma ? ?GERD (gastroesophageal reflux disease) ?- Resumed home PPI ? ?HTN (hypertension) ?- Resumed home diltiazem 120 mg p.o. twice daily, lisinopril 10 mg daily ? ?Hyponatremia ?- Appears baseline at this time ?- However given loculated pleural effusion and severe sepsis presentation, we will check for Legionella ? ?COPD, moderate ([place]) ?- pulm consulted, Dr. Mortimer Fries ?Plan: ?--cont IV solumedrol, transition to prednisone tomorrow ?--switch from albuterol neb to Xopenex neb due to tachycardia ?--cont pulmicort neb ?--cont  Spiriva ? ?Tobacco dependency ?- Nicotine patch as needed ordered ? ? ? ? ?  ? ?Subjective:  ?Pt complained of  intermittent dyspnea.  Later complained of chest pain, which pt reported as chronic.  EKG non-acute, and trop neg.  Chest pain improved with SL nitro. ? ? ?Physical Exam: ? ?Constitutional: NAD, AAOx3, appeared cachectic  ?HEENT: conjunctivae and lids normal, EOMI ?CV: No cyanosis.   ?RESP: increased respiratory effort, on 5L [place] ?Neuro: II - XII grossly intact.   ?Psych: Normal mood and affect.   ? ? ?Data Reviewed: ? ?Family Communication:  ? ?Disposition: ?Status is: Inpatient ? ? Planned Discharge Destination: Home  currently refusing SNF rehab ? ? ? ?Time spent: 50 minutes ? ?Author: ?Enzo Bi, MD ?08/31/2021 7:04 PM ? ?For on call review [email or web address].  ?

## 2021-09-01 ENCOUNTER — Other Ambulatory Visit (HOSPITAL_COMMUNITY): Payer: Self-pay

## 2021-09-01 ENCOUNTER — Inpatient Hospital Stay: Payer: Medicare HMO

## 2021-09-01 DIAGNOSIS — J9621 Acute and chronic respiratory failure with hypoxia: Secondary | ICD-10-CM | POA: Diagnosis not present

## 2021-09-01 DIAGNOSIS — I959 Hypotension, unspecified: Secondary | ICD-10-CM

## 2021-09-01 LAB — CULTURE, BLOOD (ROUTINE X 2)
Culture: NO GROWTH
Culture: NO GROWTH

## 2021-09-01 LAB — CBC
HCT: 36.1 % — ABNORMAL LOW (ref 39.0–52.0)
Hemoglobin: 11.2 g/dL — ABNORMAL LOW (ref 13.0–17.0)
MCH: 27.1 pg (ref 26.0–34.0)
MCHC: 31 g/dL (ref 30.0–36.0)
MCV: 87.2 fL (ref 80.0–100.0)
Platelets: 394 10*3/uL (ref 150–400)
RBC: 4.14 MIL/uL — ABNORMAL LOW (ref 4.22–5.81)
RDW: 17.2 % — ABNORMAL HIGH (ref 11.5–15.5)
WBC: 17.4 10*3/uL — ABNORMAL HIGH (ref 4.0–10.5)
nRBC: 0 % (ref 0.0–0.2)

## 2021-09-01 LAB — BASIC METABOLIC PANEL
Anion gap: 4 — ABNORMAL LOW (ref 5–15)
BUN: 36 mg/dL — ABNORMAL HIGH (ref 8–23)
CO2: 42 mmol/L — ABNORMAL HIGH (ref 22–32)
Calcium: 8.8 mg/dL — ABNORMAL LOW (ref 8.9–10.3)
Chloride: 88 mmol/L — ABNORMAL LOW (ref 98–111)
Creatinine, Ser: 0.65 mg/dL (ref 0.61–1.24)
GFR, Estimated: >60 mL/min (ref >60)
Glucose, Bld: 91 mg/dL (ref 70–99)
Potassium: 4.7 mmol/L (ref 3.5–5.1)
Sodium: 134 mmol/L — ABNORMAL LOW (ref 135–145)

## 2021-09-01 LAB — MAGNESIUM: Magnesium: 2.2 mg/dL (ref 1.7–2.4)

## 2021-09-01 LAB — LEGIONELLA PNEUMOPHILA SEROGP 1 UR AG: L. pneumophila Serogp 1 Ur Ag: NEGATIVE

## 2021-09-01 MED ORDER — QUETIAPINE FUMARATE 25 MG PO TABS
25.0000 mg | ORAL_TABLET | Freq: Every day | ORAL | Status: DC
Start: 2021-09-01 — End: 2021-09-03
  Administered 2021-09-01 – 2021-09-02 (×2): 25 mg via ORAL
  Filled 2021-09-01 (×2): qty 1

## 2021-09-01 MED ORDER — TRAMADOL HCL 50 MG PO TABS
50.0000 mg | ORAL_TABLET | Freq: Four times a day (QID) | ORAL | Status: DC | PRN
  Administered 2021-09-01 – 2021-09-02 (×2): 50 mg via ORAL
  Filled 2021-09-01 (×2): qty 1

## 2021-09-01 MED ORDER — IBUPROFEN 400 MG PO TABS
400.0000 mg | ORAL_TABLET | Freq: Four times a day (QID) | ORAL | Status: DC | PRN
  Administered 2021-09-05: 400 mg via ORAL
  Filled 2021-09-01: qty 1

## 2021-09-01 MED ORDER — ACETAZOLAMIDE 250 MG PO TABS
250.0000 mg | ORAL_TABLET | Freq: Once | ORAL | Status: AC
  Administered 2021-09-01: 250 mg via ORAL
  Filled 2021-09-01: qty 1

## 2021-09-01 MED ORDER — ACETAMINOPHEN 500 MG PO TABS
1000.0000 mg | ORAL_TABLET | Freq: Three times a day (TID) | ORAL | Status: DC | PRN
  Administered 2021-09-01 – 2021-09-04 (×2): 1000 mg via ORAL
  Filled 2021-09-01 (×2): qty 2

## 2021-09-01 MED ORDER — MIDODRINE HCL 5 MG PO TABS
5.0000 mg | ORAL_TABLET | Freq: Three times a day (TID) | ORAL | Status: DC
  Administered 2021-09-02 – 2021-09-08 (×18): 5 mg via ORAL
  Filled 2021-09-01 (×18): qty 1

## 2021-09-01 MED ORDER — ACETAZOLAMIDE 250 MG PO TABS: 250.0000 mg | ORAL_TABLET | Freq: Once | ORAL | Status: DC

## 2021-09-01 NOTE — TOC Benefit Eligibility Note (Signed)
Patient Advocate Encounter ? ?Insurance verification completed.   ? ?The patient is currently admitted and upon discharge could be taking linezolid (Zyvox) 600 mg tablets. ? ?The current 14 day co-pay is, $0.00.  ? ?The patient is insured through [employer] Part D  ? ? ?Lyndel Safe, CPhT ?Pharmacy Patient Advocate Specialist ?[name] Patient Advocate Team ?Direct Number: [phone]  Fax: [phone] ? ? ? ? ? ?  ?

## 2021-09-01 NOTE — TOC Progression Note (Signed)
Transition of Care (TOC) - Progression Note  ? ? ?Patient Details  ?Name: Jeremiah [name] ?MRN: [id number] ?Date of Birth: 1950/08/26 ? ?Transition of Care (TOC) CM/SW Contact  ?Alberteen Sam, LCSW ?Phone Number: ?09/01/2021, 3:21 PM ? ?Clinical Narrative:    ? ?Patient and friend [name] are not agreeable to SNF, bed search started. Pending bed offers at this time.  ? ?Expected Discharge Plan: [place] ?Barriers to Discharge: Continued Medical Work up ? ?Expected Discharge Plan and Services ?Expected Discharge Plan: [place] ?  ?Discharge Planning Services: CM Consult ?Post Acute Care Choice: Home Health ?Living arrangements for the past 2 months: Mobile Home ?                ?DME Arranged: N/A ?DME Agency: NA ?  ?  ?  ?HH Arranged: RN, PT, OT ?[place] Agency: [facility] ?Date HH Agency Contacted: 08/29/21 ?Time [place]: 8938 ?Representative spoke with at [place]: Juanda Crumble ? ? ?Social Determinants of Health (SDOH) Interventions ?  ? ?Readmission Risk Interventions ? ?  08/29/2021  ?  2:00 PM 10/21/2020  ?  1:14 PM 09/27/2020  ? 11:18 AM  ?Readmission Risk Prevention Plan  ?Transportation Screening Complete Complete Complete  ?PCP or Specialist Appt within 3-5 Days  Complete Complete  ?[place] or Home Care Consult  Complete Complete  ?Social Work Consult for [place] Planning/Counseling  Complete Not Complete  ?SW consult not completed comments   RNCM assigned to patient  ?Palliative Care Screening  Not Applicable Not Applicable  ?Medication Review Press photographer) Complete Complete Complete  ?PCP or Specialist appointment within 3-5 days of discharge Complete    ?[place] or Home Care Consult Complete    ?SW Recovery Care/Counseling Consult Complete    ?Palliative Care Screening Not Applicable    ?[place] Not Complete    ?SNF Comments waiting on PT recomendation    ? ? ?

## 2021-09-01 NOTE — Assessment & Plan Note (Addendum)
BP has been low normal.  Was started on midodrine 10 mg TID. ?--hold home Cardizem and Lisinopril ?--cont midodrine to 5 mg TID ?

## 2021-09-01 NOTE — Progress Notes (Signed)
Physical Therapy Treatment ?Patient Details ?Name: Jeremiah [name] ?MRN: [id number] ?DOB: [birth year] ?Today's Date: 09/01/2021 ? ? ?History of Present Illness [name] is a 71 year old male with history of tobacco dependence, depression, anxiety, COPD, hypertension, GERD, chronic respiratory failure on 4 to 5 L nasal cannula at home, who presents emergency department for chief concerns of worsening shortness of breath for 2 weeks. ? ?  ?PT Comments  ? ? Pt is making limited progress towards goals with ability to do supine there-ex and then able to perform sitting at EOB briefly. Unwilling to attempt standing or OOB mobility and reports "I'll be better when I'm home." Pt on 4L of O2 for entire session with sats WNL. WIll continue to progress.   ?Recommendations for follow up therapy are one component of a multi-disciplinary discharge planning process, led by the attending physician.  Recommendations may be updated based on patient status, additional functional criteria and insurance authorization. ? ?Follow Up Recommendations ? Skilled nursing-short term rehab (<3 hours/day) ?  ?  ?Assistance Recommended at Discharge Frequent or constant Supervision/Assistance  ?Patient can return home with the following A lot of help with walking and/or transfers;A lot of help with bathing/dressing/bathroom ?  ?Equipment Recommendations ? Rolling walker (2 wheels)  ?  ?Recommendations for Other Services   ? ? ?  ?Precautions / Restrictions Precautions ?Precautions: None ?Restrictions ?Weight Bearing Restrictions: No  ?  ? ?Mobility ? Bed Mobility ?Overal bed mobility: Needs Assistance ?Bed Mobility: Supine to Sit, Sit to Supine ?  ?  ?Supine to sit: Min assist ?Sit to supine: Min assist ?  ?General bed mobility comments: needs assist for B LEs management. Once seated, flexed trunkal posture. Only able to tolerate sitting for a few minutes prior to requesting to return back to supine ?  ? ?Transfers ?  ?  ?  ?  ?  ?  ?  ?  ?   ?General transfer comment: declines attempts ?  ? ?Ambulation/Gait ?  ?  ?  ?  ?  ?  ?  ?  ? ? ?Stairs ?  ?  ?  ?  ?  ? ? ?Wheelchair Mobility ?  ? ?Modified Rankin (Stroke Patients Only) ?  ? ? ?  ?Balance Overall balance assessment: Needs assistance ?Sitting-balance support: Feet supported, Bilateral upper extremity supported ?Sitting balance-Leahy Scale: Fair ?  ?  ?  ?  ?  ?  ?  ?  ?  ?  ?  ?  ?  ?  ?  ?  ?  ? ?  ?Cognition Arousal/Alertness: Awake/alert ?Behavior During Therapy: Anxious ?Overall Cognitive Status: Within Functional Limits for tasks assessed ?  ?  ?  ?  ?  ?  ?  ?  ?  ?  ?  ?  ?  ?  ?  ?  ?General Comments: very soft spoken and somewhat difficult to understand. ?  ?  ? ?  ?Exercises Other Exercises ?Other Exercises: supine ther-ex performed on B LE including AP, quad sets, SLRs, hip abd/add, SAQ, and hip add squeezes. Then LAQ performed x 10 reps with cga. All mobility performed on 4L of O2 with sats at 95%. ? ?  ?General Comments   ?  ?  ? ?Pertinent Vitals/Pain Pain Assessment ?Pain Assessment: No/denies pain  ? ? ?Home Living   ?  ?  ?  ?  ?  ?  ?  ?  ?  ?   ?  ?Prior  Function    ?  ?  ?   ? ?PT Goals (current goals can now be found in the care plan section) Acute Rehab PT Goals ?Patient Stated Goal: to go home. ?PT Goal Formulation: With patient ?Time For Goal Achievement: 09/13/21 ?Potential to Achieve Goals: Poor ?Progress towards PT goals: Progressing toward goals ? ?  ?Frequency ? ? ? Min 2X/week ? ? ? ?  ?PT Plan Current plan remains appropriate  ? ? ?Co-evaluation   ?  ?  ?  ?  ? ?  ?AM-PAC PT "6 Clicks" Mobility   ?Outcome Measure ? Help needed turning from your back to your side while in a flat bed without using bedrails?: A Little ?Help needed moving from lying on your back to sitting on the side of a flat bed without using bedrails?: A Little ?Help needed moving to and from a bed to a chair (including a wheelchair)?: A Lot ?Help needed standing up from a chair using your arms  (e.g., wheelchair or bedside chair)?: A Lot ?Help needed to walk in hospital room?: Total ?Help needed climbing 3-5 steps with a railing? : Total ?6 Click Score: 12 ? ?  ?End of Session Equipment Utilized During Treatment: Oxygen ?Activity Tolerance: Patient limited by fatigue ?Patient left: in bed;with bed alarm set ?Nurse Communication: Mobility status ?PT Visit Diagnosis: Unsteadiness on feet (R26.81);Other abnormalities of gait and mobility (R26.89);Muscle weakness (generalized) (M62.81);Difficulty in walking, not elsewhere classified (R26.2) ?  ? ? ?Time: 8295-6213 ?PT Time Calculation (min) (ACUTE ONLY): 25 min ? ?Charges:  $Therapeutic Exercise: 23-37 mins          ?          ? ?Greggory Stallion, PT, DPT, GCS ?[phone] ? ? ? ?Jeremiah [name] ?09/01/2021, 1:20 PM ? ?

## 2021-09-01 NOTE — [place] FL2 (Signed)
?[place] MEDICAID FL2 LEVEL OF CARE SCREENING TOOL  ?  ? ?IDENTIFICATION  ?Patient Name: ?Jeremiah [name] Birthdate: February 12, 1951 Sex: male Admission Date (Current Location): ?08/26/2021  ?[place] and [place] Number: ? [place] ?  Facility and Address:  ?[facility], [address], [place], [place] [address] ?     Provider Number: ?[id number]  ?Attending Physician Name and Address:  ?Enzo Bi, MD ? Relative Name and Phone Number:  ?[name] [phone] ?   ?Current Level of Care: ?Hospital Recommended Level of Care: ?[place] Prior Approval Number: ?  ? ?Date Approved/Denied: ?  PASRR Number: ?[id number] A ? ?Discharge Plan: ?SNF ?  ? ?Current Diagnoses: ?Patient Active Problem List  ? Diagnosis Date Noted  ? Left lower lobe pulmonary nodule 08/26/2021  ? Depression 08/26/2021  ? Sepsis due to pneumonia ([place]) 08/26/2021  ? Pleural effusion on right 08/26/2021  ? Chronic diastolic CHF (congestive heart failure) ([place]) 07/01/2021  ? Chronic hypoxemic respiratory failure ([place]) 07/01/2021  ? CAP (community acquired pneumonia) 06/30/2021  ? COPD with acute exacerbation ([place]) 06/30/2021  ? Thrush, oral 06/16/2021  ? Unintentional weight loss 06/16/2021  ? Pressure injury of skin 06/16/2021  ? Protein-calorie malnutrition, severe 06/16/2021  ? Dehydration   ? Syncope 06/15/2021  ? Malnutrition of moderate degree 05/31/2021  ? GERD (gastroesophageal reflux disease) 05/28/2021  ? Acute respiratory failure with hypoxia ([place]) 05/28/2021  ? Anemia 10/18/2020  ? COPD, moderate ([place]) 05/14/2020  ? Nicotine dependence 05/14/2020  ? Hyponatremia 05/14/2020  ? HTN (hypertension) 05/14/2020  ? Abnormal LFTs 05/14/2020  ? Acute on chronic respiratory failure with hypoxia ([place]) 10/15/2016  ? Tobacco dependency 10/15/2016  ? ? ?Orientation RESPIRATION BLADDER Height & Weight   ?  ?Self, Time, Situation, Place ? O2 (4L nasal cannula) Incontinent, External catheter Weight: 140 lb 3.4 oz (63.6  kg) ?Height:  6' (182.9 cm)  ?BEHAVIORAL SYMPTOMS/MOOD NEUROLOGICAL BOWEL NUTRITION STATUS  ?    Continent Diet (see discharge summary)  ?AMBULATORY STATUS COMMUNICATION OF NEEDS Skin   ?Limited Assist Verbally Other (Comment) (right heel open wound) ?  ?  ?  ?    ?     ?     ? ? ?Personal Care Assistance Level of Assistance  ?Bathing, Feeding, Dressing, Total care Bathing Assistance: Limited assistance ?Feeding assistance: Independent ?Dressing Assistance: Limited assistance ?Total Care Assistance: Limited assistance  ? ?Functional Limitations Info  ?Sight, Speech, Hearing Sight Info: Adequate ?Hearing Info: Adequate ?Speech Info: Adequate  ? ? ?SPECIAL CARE FACTORS FREQUENCY  ?PT (By licensed PT), OT (By licensed OT)   ?  ?PT Frequency: min 4x weekly ?OT Frequency: min 4x weekly ?  ?  ?  ?   ? ? ?Contractures Contractures Info: Not present  ? ? ?Additional Factors Info  ?Code Status, Allergies Code Status Info: full ?Allergies Info: Penicillins ?  ?  ?  ?   ? ?Current Medications (09/01/2021):  This is the current hospital active medication list ?Current Facility-Administered Medications  ?Medication Dose Route Frequency Provider Last Rate Last Admin  ? acetaminophen (TYLENOL) tablet 1,000 mg  1,000 mg Oral TID PRN Enzo Bi, MD   1,000 mg at 09/01/21 4128  ? atorvastatin (LIPITOR) tablet 40 mg  40 mg Oral Daily Cox, Amy N, DO   40 mg at 09/01/21 1056  ? budesonide (PULMICORT) nebulizer solution 0.5 mg  0.5 mg Nebulization BID Darel Hong D, NP   0.5 mg at 09/01/21 0737  ? Chlorhexidine Gluconate Cloth  2 % PADS 6 each  6 each Topical Daily Wyvonnia Dusky, MD   6 each at 08/31/21 [phone]  ? DULoxetine (CYMBALTA) DR capsule 30 mg  30 mg Oral Daily Cox, Amy N, DO   30 mg at 09/01/21 1055  ? enoxaparin (LOVENOX) injection 40 mg  40 mg Subcutaneous Q24H Flora Lipps, MD   40 mg at 08/31/21 2154  ? feeding supplement (ENSURE ENLIVE / ENSURE PLUS) liquid 237 mL  237 mL Oral TID BM Wyvonnia Dusky, MD   237 mL at  09/01/21 1449  ? guaiFENesin-dextromethorphan (ROBITUSSIN DM) 100-10 MG/5ML syrup 5 mL  5 mL Oral Q6H PRN Cox, Amy N, DO      ? ibuprofen (ADVIL) tablet 400 mg  400 mg Oral Q6H PRN Enzo Bi, MD      ? ipratropium-albuterol (DUONEB) 0.5-2.5 (3) MG/3ML nebulizer solution 3 mL  3 mL Nebulization Q4H PRN Wyvonnia Dusky, MD   3 mL at 08/29/21 1608  ? levalbuterol (XOPENEX) nebulizer solution 0.63 mg  0.63 mg Nebulization QID Enzo Bi, MD   0.63 mg at 09/01/21 1223  ? levofloxacin (LEVAQUIN) tablet 750 mg  750 mg Oral Daily Enzo Bi, MD   750 mg at 09/01/21 0423  ? linezolid (ZYVOX) tablet 600 mg  600 mg Oral Q12H Enzo Bi, MD   600 mg at 09/01/21 1056  ? midodrine (PROAMATINE) tablet 10 mg  10 mg Oral TID WC Wyvonnia Dusky, MD   10 mg at 09/01/21 1227  ? montelukast (SINGULAIR) tablet 10 mg  10 mg Oral QHS Cox, Amy N, DO   10 mg at 08/31/21 2153  ? multivitamin with minerals tablet 1 tablet  1 tablet Oral Daily Wyvonnia Dusky, MD   1 tablet at 09/01/21 1055  ? nicotine (NICODERM CQ - dosed in mg/24 hours) patch 21 mg  21 mg Transdermal Daily PRN Cox, Amy N, DO   21 mg at 09/01/21 1111  ? nitroGLYCERIN (NITROSTAT) SL tablet 0.4 mg  0.4 mg Sublingual Q5 min PRN Enzo Bi, MD   0.4 mg at 08/31/21 1410  ? ondansetron (ZOFRAN) tablet 4 mg  4 mg Oral Q6H PRN Cox, Amy N, DO      ? Or  ? ondansetron (ZOFRAN) injection 4 mg  4 mg Intravenous Q6H PRN Cox, Amy N, DO      ? pantoprazole (PROTONIX) EC tablet 20 mg  20 mg Oral Daily Cox, Amy N, DO   20 mg at 09/01/21 1057  ? predniSONE (DELTASONE) tablet 40 mg  40 mg Oral Q breakfast Enzo Bi, MD   40 mg at 09/01/21 0813  ? sodium chloride (OCEAN) 0.65 % nasal spray 1 spray  1 spray Each Nare PRN Wyvonnia Dusky, MD   1 spray at 08/27/21 1804  ? sodium chloride tablet 2 g  2 g Oral BID WC Cox, Amy N, DO   2 g at 09/01/21 0813  ? tamsulosin (FLOMAX) capsule 0.4 mg  0.4 mg Oral Daily Wyvonnia Dusky, MD   0.4 mg at 09/01/21 1055  ? tiotropium (SPIRIVA)  inhalation capsule ([facility] use ONLY) 18 mcg  18 mcg Inhalation Daily Cox, Amy N, DO   18 mcg at 09/01/21 1445  ? traMADol (ULTRAM) tablet 50 mg  50 mg Oral Q6H PRN Enzo Bi, MD   50 mg at 09/01/21 1057  ? ? ? ?Discharge Medications: ?Please see discharge summary for a list of discharge medications. ? ?Relevant Imaging Results: ? ?  Relevant Lab Results: ? ? ?Additional Information ?SSN: [id number] ? ?Alberteen Sam, LCSW ? ? ? ? ?

## 2021-09-01 NOTE — Progress Notes (Signed)
? ?NAME:  Jeremiah [name], MRN:  [id number], DOB:  1950/08/09, LOS: 6 ?ADMISSION DATE:  08/26/2021, CONSULTATION DATE:  08/29/2021 ?REFERRING MD:  Dr. Jimmye Norman, CHIEF COMPLAINT:  Acute on chronic Hypoxic respiratory failure  ? ?Brief Pt Description / Synopsis:  ?71 y.o. Male with PMH significant for COPD requiring 4L supplemental O2 at baseline admitted with Acute on Chronic Hypoxic Respiratory Failure in the setting of AECOPD, Community Acquired Pneumonia, large (partially loculated) Right Pleural Effusion.  Also found to have new LLL spiculated lung nodule. ? ?History of Present Illness:  ?Jeremiah [name] is a 71 year old male with a past medical history significant for COPD requiring 4 to 5 L supplemental O2 at home, tobacco abuse, hypertension, anxiety, depression, GERD who presented to [facility] ED on 08/26/2021 due to complaints of worsening shortness of breath for approximately 2 weeks. ? ?He also reported worsening cough that was productive of dark yellow sputum.  He also endorses that he is lost a significant mount of weight in the last 6 months, unable to provide a specific amount of pounds.  He denied fever, chills, nausea, vomiting, chest pain, abdominal pain, dysuria. ? ?ED Course: ?Initial Vital Signs: Temperature 97.3 ?F, respiratory rate 25, pulse 102, blood pressure 119/76, SPO2 98% on BiPAP ?Significant Labs: Serum sodium is 131, potassium 3.8, chloride 86, bicarb of 33, BUN of 14, serum creatinine of 0.48, GFR greater than 60, nonfasting blood glucose 73, WBC elevated at 15.4, hemoglobin 12, platelets of 462.  COVID/influenza A/influenza B PCR were negative ?Imaging ?Chest X-ray>>Interval increase of an at least small volume right pleural effusion. Aortic Atherosclerosis (ICD10-I70.0) and Emphysema  ?CTA Chest>>Marked severity emphysematous lung disease with marked severity right lower lobe consolidation. Large partially loculated right-sided pleural effusion. Fracture deformities involving the superior  endplates of the T8 and T11 vertebral bodies, new when compared to the prior exam. New 12 mm x 6 mm spiculated lung nodule versus area of focal scarring within the posterior aspect of the left lower lobe. Correlation with follow-up PET-CT or tissue sampling is recommended. This recommendation follows the consensus statement: Guidelines for Management of Incidental Pulmonary Nodules Detected on CT Images: From the Fleischner Society 2017; Radiology 2017; 284:228-243. Small, approximately 11 mm thick anterior pericardial effusion. Coronary artery disease. ?Medications Administered: DuoNeb x2, Solu-Medrol 125 mg IV one-time dose, aztreonam and vancomycin. ? ?He was admitted by the hospitalist for further work-up and treatment of acute on chronic hypoxic respiratory failure. ? ?Please see "Significant Hospital Events" section below for full detailed hospital course. ? ?Pertinent  Medical History  ? ?Past Medical History:  ?Diagnosis Date  ? Acute and chronic respiratory failure (acute-on-chronic) ([place]) 10/18/2020  ? Asthma   ? COPD (chronic obstructive pulmonary disease) ([place])   ? O2 dependent - 2L  ? GERD (gastroesophageal reflux disease)   ? HTN (hypertension)   ? Multifocal pneumonia 06/16/2020  ? Other emphysema ([place]) 08/22/2016  ? Oxygen dependent   ? Sepsis ([place]) 05/14/2020  ? Tobacco dependence   ? ? ?Micro Data:  ?4/21: SARS-CoV-2 and influenza PCR>>negative ?4/22: Blood culture x2>>no growth to date ?4/22: MRSA PCR>>positive ?4/24: Legionella species culture>> ?4/24: Pleural fluid>>NGTD ? ?Antimicrobials:  ?Cefepime 4/21 x 1 dose ?Aztreonam 4/21>>4/22 ?Levofloxacin 4/21>>4/25, restarted 4/27>> ?Vancomycin 4/21>>4/27 ?Linezolid 4/27>> ? ?Significant Hospital Events: ?Including procedures, antibiotic start and stop dates in addition to other pertinent events   ?4/21: Admitted by the hospitalist for commune acquired pneumonia and COPD exacerbation ?4/23: Increased oxygen demand, respiratory rate, desaturations with  minimal movement.  Was started on IV Lasix twice daily.  Palliative care consulted.  IR consulted for thoracentesis ?4/24: PCCM asked to see for pulmonary consult given accessory muscle use and high risk for decompensation.  Plan for thoracentesis by IR today. ?4/24: Right-sided thoracentesis removal of 1.2L clear yellow fluid  ?4/27: Pleural fluid consistent with EXUDATE ~ suspect Parapneumonic effusion, cultures still pending.  Weaned to baseline 4L supplemental O2. Will give 250 mg Diamox x1 given metabolic alkalosis.  PCCM will sign off. ? ?Interim History / Subjective:  ?-No significant events noted overnight ?-Afebrile, hemodynamically stable, weaned back to baseline home 4L [place] ?-Pt reports that he is currently not SOB at rest, but becomes very winded with any movement/exertion ?-Noted to have inspiratory/expiratory wheezing to left lung ?-Report steroids are making him hungry ?-Reports he "doesn't feel ready to be discharged from hospital" ?-CXR this morning "Findings suggestive of CHF with small bilateral pleural effusions and associated bibasilar opacities, likely atelectasis. No ?significant interval change from prior" ~ discussed with Dr. Mortimer Fries, will give 250 mg Diamox x1 dose given metabolic alkalosis (both BP and Creatinine stable, serum bicarb 42) ? ?Objective   ?Blood pressure 115/74, pulse 98, temperature 97.6 ?F (36.4 ?C), temperature source Oral, resp. rate 18, height 6' (1.829 m), weight 63.6 kg, SpO2 100 %. ?   ?   ? ?Intake/Output Summary (Last 24 hours) at 09/01/2021 0734 ?Last data filed at 09/01/2021 0400 ?Gross per 24 hour  ?Intake 1830 ml  ?Output 1500 ml  ?Net 330 ml  ? ? ?Filed Weights  ? 08/26/21 1727 08/27/21 0100  ?Weight: 72.6 kg 63.6 kg  ? ? ?Examination: ?General: Acute on chronically ill-appearing, frail, cachetic male, sitting in bed, resting comfortably, NAD ?HENT: Atraumatic, normocephalic, burn present on tip of nose, neck supple, no JVD ?Lungs: Diminished throughout with  expiratory wheezing to Left lung fields, mild tachypnea worse with exertion  ?Cardiovascular: Regular rate and rhythm, S1-S2, no M/R/G ?Abdomen: Soft, mild tenderness, non distended, bowel sounds positive x4 ?Extremities: Generalized weakness, trace bilateral lower extremity edema  ?Neuro: Alert and oriented, follows commands, PERRLA ?GU: External catheter in place draining yellow urine  ? ?Resolved Hospital Problem list   ? ? ?Assessment & Plan:  ? ?Acute on Chronic Hypoxic Respiratory Failure in the setting of AECOPD, Community Acquired Pneumonia, large (partially loculated) Right Pleural Effusion s/p right-sided thoracentesis 04/25, and Cor Pulmonale ?New LLL spiculated lung nodule ?-Supplemental O2 as needed to maintain O2 sats 88 to 92% ?-Follow intermittent Chest X-ray & ABG as needed ?-Bronchodilators & Pulmicort nebs ?-Continue Prednisone 40 mg daily ?-ABX as above ?-Echocardiogram 08/28/21: LVEF 71-06%, RV systolic function mildly reduced and RV size moderately enlarged. Normal pulmonary artery systolic pressure ?-CXR 4/27 "Findings suggestive of CHF with small bilateral pleural effusions and associated bibasilar opacities, likely atelectasis. No ?significant interval change from prior." ~ discussed with Dr. Mortimer Fries, will give Diamox 250 mg x1 dose 2/69 given metabolic alkalosis ?-Pulmonary toilet as able ?-Will need outpatient work up of nodule once clinically stable ?-Encourage smoking cessation ?-Pt will need Prednisone taper upon discharge ?-Will need to follow up with Dr. Raul Del at discharge for close follow up/workup of new LLL lung nodule ? ?Community Acquired Pneumonia ?-Monitor fever curve ?-Trend WBC's & Procalcitonin ?-Follow cultures as above ?-Continue abx as outlined above  ? ? ? ? ?Patient is acutely ill-appearing, superimposed on chronic comorbidities most notably for severe COPD requiring 4 to 5 L supplemental oxygen at baseline.  Prognosis is extremely guarded, high risk  for decompensation  requiring intubation, cardiac arrest and death.  Recommend DNR/DNI status.  Palliative care is consulted for assistance with further goals of care discussions. ? ?Best Practice (right click and "Reselect all SmartList

## 2021-09-01 NOTE — Progress Notes (Signed)
?   09/01/21 0800  ?Clinical Encounter Type  ?Visited With Patient  ?Visit Type Initial  ?Referral From Nurse  ?Consult/Referral To Chaplain  ? ?Chaplain responded to nurse consult. Chaplain provided compassionate presence and support. ?

## 2021-09-01 NOTE — Plan of Care (Signed)

## 2021-09-01 NOTE — Progress Notes (Signed)
?Progress Note ? ? ?Patient: Jeremiah [name] WUJ:[id number] DOB: [birth year] DOA: 08/26/2021     6 ?DOS: the patient was seen and examined on 09/01/2021 ?  ?Brief hospital course: ?[name] is a 71 year old male with history of tobacco dependence, depression, anxiety, COPD, hypertension, GERD, chronic respiratory failure on 4 to 5 L nasal cannula at home, who presents emergency department for chief concerns of worsening shortness of breath for 2 weeks. ?Initial vitals in the emergency department showed temperature of 97.3, respiration rate of 25, heart rate of 102, blood pressure 119/76, SPO2 of 98% initially on BiPAP. ? ?At bedside, patient's SPO2 is improved at 97 to 100% on 8 L of high flow nasal cannula. ? ?Serum sodium is 131, potassium 3.8, chloride 86, bicarb of 33, BUN of 14, serum creatinine of 0.48, GFR greater than 60, nonfasting blood glucose 73, WBC elevated at 15.4, hemoglobin 12, platelets of 462.  COVID/influenza A/influenza B PCR were negative ? ?Portable chest x-ray in the emergency department was read as interval increase with right pleural effusion. ? ?CTA of the chest for PE with and without contrast was read as: Marked severity emphysematous lung disease with marked severity of the right lower lobe consolidation.  Large partially loculated right-sided pleural effusion.  New 12 mm x 6 mm spiculated lung nodule versus area of focal scarring within the posterior aspect of the left lower lobe. ? ?ED treatment: DuoNeb x2, Solu-Medrol 125 mg IV one-time dose, aztreonam and vancomycin. ? ?Assessment and Plan: ?* Acute on chronic respiratory failure with hypoxia (HCC) ?--on home 4-5L O2 at baseline.   ?- Initially on BiPAP, now back on 5L O2 ?- Multifactorial including right-sided loculated pleural effusion, community-acquired pneumonia, severe COPD. ?--s/p right thoracentesis  ?--s/p IV Lasix ?Plan: ?--cont tx for PNA, per pulm rec ?--no further standing diuretic, per pulm  ?--Continue  supplemental O2 to keep sats between 88-92% ? ?Hypotension ?BP has been low normal.  Was started on midodrine 10 mg TID. ?--hold home Cardizem and Lisinopril ?--reduce midodrine to 5 mg TID ? ?Pleural effusion on right ?- With partial loculation ?--s/p right thora with 1.2L removal ?--outpatient f/u with Dr. Raul Del about final pleural fluid path, to rule out mesothelioma.   ? ?Sepsis due to pneumonia [facility]) ?- Patient meeting severe sepsis criteria with increased heart rate, increased respiration rate, hypoxia requiring BiPAP on initial presentation to the ED, elevated white cell count, source of loculated pleural effusion versus pneumonia ?- abx as above ? ?Depression ?- cont home duloxetine 30 mg daily ? ?Left lower lobe pulmonary nodule ?- On CTA of chest on 08/26/2021, described as spiculated, 12 x 6 mm ?- Discussed extensively with patient at bedside that he will need outpatient follow-up ?- Patient endorses understanding and compliance and repeat the information and instructions to me ? ?Chronic hypoxemic respiratory failure (HCC) ?- On baseline 4 to 5 L nasal cannula ? ?CAP (community acquired pneumonia) ?--MRSA screen pos.  Completed 5 days of Levaquin and IV vanc ?Plan: ?--cont Levaquin  ?--cont Linezolid  ? ?Protein-calorie malnutrition, severe ?- Dietary consulted, supplements per dietician  ? ?Unintentional weight loss ?- Concerning for carcinoma ? ?GERD (gastroesophageal reflux disease) ?- cont home PPI ? ?HTN (hypertension) ?-BP currently low normal ?--hold home cardizem, Lisinopril ? ?Hyponatremia ?- Appears baseline at this time ? ?COPD, moderate ([place]) ?- pulm consulted, Dr. Mortimer Fries ?--IV solumedrol transitioned to prednisone  ?Plan: ?--cont prednisone with taper ?--cont Xopenex neb ?--cont pulmicort neb ?--cont Spiriva ? ?Tobacco dependency ?-  Nicotine patch as needed ordered ? ? ? ? ?  ? ?Subjective:  ?Pt complained of pain in various parts of his body at different times.  Also complained of insomnia.   ? ? ?Physical Exam: ? ?Constitutional: NAD, AAOx3 ?HEENT: conjunctivae and lids normal, EOMI ?CV: No cyanosis.   ?RESP: increased RR, on 5L ?Extremities: No effusions, edema in BLE ?SKIN: warm, dry ?Neuro: II - XII grossly intact.   ?Psych: depressed mood and affect.   ? ? ?Data Reviewed: ? ?Family Communication: updated caregiver [name] at the bedside today ? ?Disposition: ?Status is: Inpatient ? ? Planned Discharge Destination: Rehab Pt agreed to start search for SNF rehab today. ? ? ? ?Time spent: 50 minutes ? ?Author: ?Enzo Bi, MD ?09/01/2021 6:13 PM ? ?For on call review [email or web address].  ?

## 2021-09-01 NOTE — Progress Notes (Signed)
Nutrition Follow-up ? ?DOCUMENTATION CODES:  ? ?Severe malnutrition in context of chronic illness ? ?INTERVENTION:  ? ?-Continue Ensure Enlive po TID, each supplement provides 350 kcal and 20 grams of protein ?-Continue Magic cup TID with meals, each supplement provides 290 kcal and 9 grams of protein  ?-Continue MVI with minerals daily ?-Downgrade diet to dysphagia 3 (advanced mechanical soft) for ease of intake ? ?NUTRITION DIAGNOSIS:  ? ?Severe Malnutrition related to chronic illness (COPD, CHF) as evidenced by percent weight loss, moderate fat depletion, moderate muscle depletion, severe muscle depletion. ? ?Ongoing ? ?GOAL:  ? ?Patient will meet greater than or equal to 90% of their needs ? ?Progressing  ? ?MONITOR:  ? ?PO intake, Supplement acceptance, Labs, Weight trends, Skin, I & O's ? ?REASON FOR ASSESSMENT:  ? ?Consult ?Assessment of nutrition requirement/status ? ?ASSESSMENT:  ? ?71 y/o male with h/o CHF, COPD, HTN, GERD, depression, pulmonary nodule and malnutrition who is admitted with CAP and sepsis. ? ?4/24- s/p rt thoracentesis (1.2 L removed) ? ?Reviewed I/O's: +330 ml x 24 hours and -3.4 L since admission ? ?UOP: 1.5 L x 24 hours ? ?Spoke with pt at bedside, who reports feeling a little better today, but reports pain in his lower extremities. Observed breakfast tray- pt consumed only about 25% of breakfast tray. Pt reports it takes him a long time to chew tough meats and this depletes his energy. Pt amenable to downgrade diet. Pt also consuming Ensure supplements intermittently. Meal completions 50-100%.  ? ?Discussed importance of good meal and supplement intake to promote healing.  ? ?Palliative care following; plan to continue with full scope care. Pt resistant to try morphine for air hunger.  ? ?Medications reviewed and include prednisone.  ? ?Labs reviewed.  ? ?Diet Order:   ?Diet Order   ? ?       ?  DIET DYS 3 Room service appropriate? Yes; Fluid consistency: Thin  Diet effective now        ?  ? ?  ?  ? ?  ? ? ?EDUCATION NEEDS:  ? ?Education needs have been addressed ? ?Skin:  Skin Assessment: Skin Integrity Issues: ?Skin Integrity Issues:: Other (Comment) ?Other: open wound to rt heel ? ?Last BM:  08/28/21 ? ?Height:  ? ?Ht Readings from Last 1 Encounters:  ?08/26/21 6' (1.829 m)  ? ? ?Weight:  ? ?Wt Readings from Last 1 Encounters:  ?08/27/21 63.6 kg  ? ? ?Ideal Body Weight:  80.9 kg ? ?BMI:  Body mass index is 19.02 kg/m?. ? ?Estimated Nutritional Needs:  ? ?Kcal:  1900-2200kcal/day ? ?Protein:  95-110g/day ? ?Fluid:  1.6-1.9L/day ? ? ? ?Loistine Chance, RD, LDN, CDCES ?Registered Dietitian II ?Certified Diabetes Care and Education Specialist ?Please refer to [facility] for RD and/or RD on-call/weekend/after hours pager  ?

## 2021-09-02 DIAGNOSIS — G4700 Insomnia, unspecified: Secondary | ICD-10-CM

## 2021-09-02 DIAGNOSIS — J9621 Acute and chronic respiratory failure with hypoxia: Secondary | ICD-10-CM | POA: Diagnosis not present

## 2021-09-02 LAB — BODY FLUID CULTURE W GRAM STAIN: Culture: NO GROWTH

## 2021-09-02 LAB — MAGNESIUM: Magnesium: 2 mg/dL (ref 1.7–2.4)

## 2021-09-02 LAB — BASIC METABOLIC PANEL
Anion gap: 3 — ABNORMAL LOW (ref 5–15)
BUN: 35 mg/dL — ABNORMAL HIGH (ref 8–23)
CO2: 38 mmol/L — ABNORMAL HIGH (ref 22–32)
Calcium: 8.7 mg/dL — ABNORMAL LOW (ref 8.9–10.3)
Chloride: 90 mmol/L — ABNORMAL LOW (ref 98–111)
Creatinine, Ser: 0.82 mg/dL (ref 0.61–1.24)
GFR, Estimated: >60 mL/min (ref >60)
Glucose, Bld: 80 mg/dL (ref 70–99)
Potassium: 4.4 mmol/L (ref 3.5–5.1)
Sodium: 131 mmol/L — ABNORMAL LOW (ref 135–145)

## 2021-09-02 LAB — CBC
HCT: 36.6 % — ABNORMAL LOW (ref 39.0–52.0)
Hemoglobin: 11.4 g/dL — ABNORMAL LOW (ref 13.0–17.0)
MCH: 27.5 pg (ref 26.0–34.0)
MCHC: 31.1 g/dL (ref 30.0–36.0)
MCV: 88.4 fL (ref 80.0–100.0)
Platelets: 360 10*3/uL (ref 150–400)
RBC: 4.14 MIL/uL — ABNORMAL LOW (ref 4.22–5.81)
RDW: 17.2 % — ABNORMAL HIGH (ref 11.5–15.5)
WBC: 16.4 10*3/uL — ABNORMAL HIGH (ref 4.0–10.5)
nRBC: 0 % (ref 0.0–0.2)

## 2021-09-02 MED ORDER — QUETIAPINE FUMARATE 25 MG PO TABS
50.0000 mg | ORAL_TABLET | Freq: Once | ORAL | Status: AC
  Administered 2021-09-02: 50 mg via ORAL
  Filled 2021-09-02: qty 2

## 2021-09-02 MED ORDER — MORPHINE SULFATE 15 MG PO TABS
15.0000 mg | ORAL_TABLET | Freq: Four times a day (QID) | ORAL | Status: DC | PRN
  Administered 2021-09-02 – 2021-09-04 (×6): 15 mg via ORAL
  Filled 2021-09-02 (×6): qty 1

## 2021-09-02 NOTE — Assessment & Plan Note (Addendum)
--  cont seroquel to 75 mg nightly ?

## 2021-09-02 NOTE — Progress Notes (Signed)
?Progress Note ? ? ?Patient: Jeremiah [name] MHD:[id number] DOB: [birth year] DOA: 08/26/2021     7 ?DOS: the patient was seen and examined on 09/02/2021 ?  ?Brief hospital course: ?[name] is a 71 year old male with history of tobacco dependence, depression, anxiety, COPD, hypertension, GERD, chronic respiratory failure on 4 to 5 L nasal cannula at home, who presents emergency department for chief concerns of worsening shortness of breath for 2 weeks. ?Initial vitals in the emergency department showed temperature of 97.3, respiration rate of 25, heart rate of 102, blood pressure 119/76, SPO2 of 98% initially on BiPAP. ? ?At bedside, patient's SPO2 is improved at 97 to 100% on 8 L of high flow nasal cannula. ? ?Serum sodium is 131, potassium 3.8, chloride 86, bicarb of 33, BUN of 14, serum creatinine of 0.48, GFR greater than 60, nonfasting blood glucose 73, WBC elevated at 15.4, hemoglobin 12, platelets of 462.  COVID/influenza A/influenza B PCR were negative ? ?Portable chest x-ray in the emergency department was read as interval increase with right pleural effusion. ? ?CTA of the chest for PE with and without contrast was read as: Marked severity emphysematous lung disease with marked severity of the right lower lobe consolidation.  Large partially loculated right-sided pleural effusion.  New 12 mm x 6 mm spiculated lung nodule versus area of focal scarring within the posterior aspect of the left lower lobe. ? ?ED treatment: DuoNeb x2, Solu-Medrol 125 mg IV one-time dose, aztreonam and vancomycin. ? ?Assessment and Plan: ?* Acute on chronic respiratory failure with hypoxia (HCC) ?--on home 4-5L O2 at baseline.   ?- Initially on BiPAP, now back on 5L O2 ?- Multifactorial including right-sided loculated pleural effusion, community-acquired pneumonia, severe COPD. ?--s/p right thoracentesis  ?--s/p IV Lasix ?Plan: ?--cont tx for PNA, per pulm rec ?--no further standing diuretic, per pulm  ?--Continue  supplemental O2 to keep sats between 88-92% ? ?Insomnia ?--increase seroquel to 75 mg nightly ? ?Hypotension ?BP has been low normal.  Was started on midodrine 10 mg TID. ?--hold home Cardizem and Lisinopril ?--reduce midodrine to 5 mg TID ? ?Pleural effusion on right ?- With partial loculation ?--s/p right thora with 1.2L removal ?--outpatient f/u with Dr. Raul Del about final pleural fluid path, to rule out mesothelioma.   ? ?Sepsis due to pneumonia [facility]) ?- Patient meeting severe sepsis criteria with increased heart rate, increased respiration rate, hypoxia requiring BiPAP on initial presentation to the ED, elevated white cell count, source of loculated pleural effusion versus pneumonia ?- abx as above ? ?Depression ?- cont home duloxetine 30 mg daily ? ?Left lower lobe pulmonary nodule ?- On CTA of chest on 08/26/2021, described as spiculated, 12 x 6 mm ?- Discussed extensively with patient at bedside that he will need outpatient follow-up ?- Patient endorses understanding and compliance and repeat the information and instructions to me ? ?Chronic hypoxemic respiratory failure (HCC) ?- On baseline 4 to 5 L nasal cannula ? ?CAP (community acquired pneumonia) ?--MRSA screen pos.  Completed 5 days of Levaquin and IV vanc ?Plan: ?--cont Levaquin  ?--cont Linezolid  ? ?Protein-calorie malnutrition, severe ?- Dietary consulted, supplements per dietician  ? ?Unintentional weight loss ?- Concerning for carcinoma ? ?GERD (gastroesophageal reflux disease) ?- cont home PPI ? ?HTN (hypertension) ?-BP currently low normal ?--hold home cardizem, Lisinopril ? ?Hyponatremia ?- Appears baseline at this time ? ?COPD, severe ([place]) ?- pulm consulted, Dr. Mortimer Fries ?--IV solumedrol transitioned to prednisone  ?Plan: ?--cont prednisone with taper ?--cont Xopenex neb ?--  cont pulmicort neb ?--cont Spiriva ?--morphine PRN for air hunger and resultant anxiety ? ?Tobacco dependency ?- Nicotine patch as needed ordered ? ? ? ? ?  ? ?Subjective:   ?Pt complained that he still couldn't sleep well, woke up frequently.  Had BM 2-3 days ago. ? ? ?Physical Exam: ? ?Constitutional: NAD, AAOx3 ?HEENT: conjunctivae and lids normal, EOMI ?CV: No cyanosis.   ?RESP: increased RR, on 4L ?Extremities: No effusions, edema in BLE ?SKIN: warm, dry ?Neuro: II - XII grossly intact.   ? ? ?Data Reviewed: ? ?Family Communication:  ? ?Disposition: ?Status is: Inpatient ? ? Planned Discharge Destination: Rehab Pt agreed to start search for SNF rehab today. ? ? ? ?Time spent: 35 minutes ? ?Author: ?Enzo Bi, MD ?09/02/2021 9:00 PM ? ?For on call review [email or web address].  ?

## 2021-09-02 NOTE — TOC Progression Note (Addendum)
Transition of Care (TOC) - Progression Note  ? ? ?Patient Details  ?Name: Jeremiah [name] ?MRN: [id number] ?Date of Birth: 04-08-1951 ? ?Transition of Care (TOC) CM/SW Contact  ?Alberteen Sam, LCSW ?Phone Number: ?09/02/2021, 9:24 AM ? ?Clinical Narrative:    ? ? ?Update: Patient has local bed offer from [facility] [place] [place] spoke with patient's friend [name] regarding this. She reports she will discuss with patient and call CSW back once they decide if they want to move forward, CSW emphasized timeliness of decision in order to start insurance authorization, she expressed understanding.  ? ? ?Patient noted to have no local bed offers yet,  ?CSW has sent out referrals to larger geographical area.  ? ?Pending bed offers at this time.  ? ? ?Expected Discharge Plan: [place] ?Barriers to Discharge: Continued Medical Work up ? ?Expected Discharge Plan and Services ?Expected Discharge Plan: [place] ?  ?Discharge Planning Services: CM Consult ?Post Acute Care Choice: Home Health ?Living arrangements for the past 2 months: Mobile Home ?                ?DME Arranged: N/A ?DME Agency: NA ?  ?  ?  ?HH Arranged: RN, PT, OT ?[place] Agency: [facility] ?Date HH Agency Contacted: 08/29/21 ?Time [place]: 8850 ?Representative spoke with at [place]: Juanda Crumble ? ? ?Social Determinants of Health (SDOH) Interventions ?  ? ?Readmission Risk Interventions ? ?  08/29/2021  ?  2:00 PM 10/21/2020  ?  1:14 PM 09/27/2020  ? 11:18 AM  ?Readmission Risk Prevention Plan  ?Transportation Screening Complete Complete Complete  ?PCP or Specialist Appt within 3-5 Days  Complete Complete  ?[place] or Home Care Consult  Complete Complete  ?Social Work Consult for [place] Planning/Counseling  Complete Not Complete  ?SW consult not completed comments   RNCM assigned to patient  ?Palliative Care Screening  Not Applicable Not Applicable  ?Medication Review Press photographer) Complete Complete  Complete  ?PCP or Specialist appointment within 3-5 days of discharge Complete    ?[place] or Home Care Consult Complete    ?SW Recovery Care/Counseling Consult Complete    ?Palliative Care Screening Not Applicable    ?[place] Not Complete    ?SNF Comments waiting on PT recomendation    ? ? ?

## 2021-09-02 NOTE — Care Management Important Message (Signed)
Important Message ? ?Patient Details  ?Name: Jeremiah [name] ?MRN: [id number] ?Date of Birth: July 29, 1950 ? ? ?Medicare Important Message Given:  Yes ? ? ? ? ?Dannette Barbara ?09/02/2021, 2:18 PM ?

## 2021-09-02 NOTE — Plan of Care (Signed)

## 2021-09-02 NOTE — Progress Notes (Signed)
Occupational Therapy Treatment ?Patient Details ?Name: Jeremiah [name] ?MRN: [id number] ?DOB: 1951-01-21 ?Today's Date: 09/02/2021 ? ? ?History of present illness [name] is a 71 year old male with history of tobacco dependence, depression, anxiety, COPD, hypertension, GERD, chronic respiratory failure on 4 to 5 L nasal cannula at home, who presents emergency department for chief concerns of worsening shortness of breath for 2 weeks. ?  ?OT comments ? Pt continues to be severely limited by shortness of breath. Received supine in bed, struggling to breath, with O2 sats 89% on 4L O2. Pt requires Min-Mod A for bed mobility, instructed in rolling/repositioning regularly in bed for comfort and skin integrity. Pt reports that any movement is difficult for him, as he feels that with the slightest repositioning, "I just can't get my breath." Pt states he does not have the strength/breath to open food/drink packaging, he is able to drink with Min A for holding/repositioning containers. Provided educ re: breathing techniques for increasing O2 intake and for relaxation/anxiety management. Pt verbalized understanding, provided teach-back. O2 sats improve to 93%. Pt reports he will now consider DC to SNF. DC recs updated accordingly.   ? ?Recommendations for follow up therapy are one component of a multi-disciplinary discharge planning process, led by the attending physician.  Recommendations may be updated based on patient status, additional functional criteria and insurance authorization. ?   ?Follow Up Recommendations ? Skilled nursing-short term rehab (<3 hours/day)  ?  ?Assistance Recommended at Discharge    ?Patient can return home with the following ?   ?  ?Equipment Recommendations ? None recommended by OT  ?  ?Recommendations for Other Services   ? ?  ?Precautions / Restrictions Precautions ?Precautions: None ?Precaution Comments: mod fall risk ?Restrictions ?Weight Bearing Restrictions: No  ? ? ?  ? ?Mobility  Bed Mobility ?Overal bed mobility: Needs Assistance ?Bed Mobility: Rolling ?Rolling: Mod assist ?  ?  ?  ?  ?  ?  ? ?Transfers ?  ?  ?  ?  ?  ?  ?  ?  ?  ?General transfer comment: declines attempts ?  ?  ?Balance Overall balance assessment: Needs assistance ?Sitting-balance support: Feet supported, Bilateral upper extremity supported ?Sitting balance-Leahy Scale: Fair ?  ?Postural control: Posterior lean ?  ?Standing balance-Leahy Scale: Zero ?Standing balance comment: Unable, 2/2 SOB ?  ?  ?  ?  ?  ?  ?  ?  ?  ?  ?  ?   ? ?ADL either performed or assessed with clinical judgement  ? ?ADL Overall ADL's : Needs assistance/impaired ?Eating/Feeding: Set up;Minimal assistance ?Eating/Feeding Details (indicate cue type and reason): requires assistance for pouring, opening containers ?  ?  ?  ?  ?  ?  ?  ?  ?  ?  ?  ?  ?  ?  ?  ?  ?  ?General ADL Comments: Pt requiring Mod/Max A for all OOB mobility, 2/2 SOB ?  ? ?Extremity/Trunk Assessment Upper Extremity Assessment ?Upper Extremity Assessment: Generalized weakness ?  ?Lower Extremity Assessment ?Lower Extremity Assessment: Generalized weakness ?  ?  ?  ? ?Vision   ?  ?  ?Perception   ?  ?Praxis   ?  ? ?Cognition Arousal/Alertness: Awake/alert ?Behavior During Therapy: Anxious ?  ?  ?  ?  ?  ?  ?  ?  ?  ?  ?  ?  ?  ?  ?  ?  ?  ?General Comments: very soft spoken  and somewhat difficult to understand. ?  ?  ?   ?Exercises Other Exercises ?Other Exercises: Educ re: breathing, relaxation techniques ? ?  ?Shoulder Instructions   ? ? ?  ?General Comments    ? ? ?Pertinent Vitals/ Pain       Pain Assessment ?Pain Score: 5  ?Pain Location: general body aches ?Pain Descriptors / Indicators: Aching ?Pain Intervention(s): Limited activity within patient's tolerance, Repositioned ? ?Home Living   ?  ?  ?  ?  ?  ?  ?  ?  ?  ?  ?  ?  ?  ?  ?  ?  ?  ?  ? ?  ?Prior Functioning/Environment    ?  ?  ?  ?   ? ?Frequency ? Min 2X/week  ? ? ? ? ?  ?Progress Toward Goals ? ?OT Goals(current  goals can now be found in the care plan section) ? Progress towards OT goals: Progressing toward goals ? ?Acute Rehab OT Goals ?OT Goal Formulation: With patient ?Time For Goal Achievement: 09/14/21 ?Potential to Achieve Goals: Good  ?Plan Discharge plan needs to be updated   ? ?Co-evaluation ? ? ?   ?  ?  ?  ?  ? ?  ?AM-PAC OT "6 Clicks" Daily Activity     ?Outcome Measure ? ? Help from another person eating meals?: A Little ?Help from another person taking care of personal grooming?: A Lot ?Help from another person toileting, which includes using toliet, bedpan, or urinal?: A Lot ?Help from another person bathing (including washing, rinsing, drying)?: A Lot ?Help from another person to put on and taking off regular upper body clothing?: A Lot ?Help from another person to put on and taking off regular lower body clothing?: A Lot ?6 Click Score: 13 ? ?  ?End of Session   ? ?OT Visit Diagnosis: Unsteadiness on feet (R26.81);Muscle weakness (generalized) (M62.81) ?  ?Activity Tolerance Other (comment) (pt limited by dyspnea) ?  ?Patient Left in bed;with call bell/phone within reach;with bed alarm set ?  ?Nurse Communication   ?  ? ?   ? ?Time: 8250-5397 ?OT Time Calculation (min): 19 min ? ?Charges: OT General Charges ?$OT Visit: 1 Visit ?OT Treatments ?$Self Care/Home Management : 8-22 mins ? ?Josiah Lobo, PhD, MS, OTR/L ?09/02/21, 10:53 AM ? ?

## 2021-09-03 DIAGNOSIS — J9621 Acute and chronic respiratory failure with hypoxia: Secondary | ICD-10-CM | POA: Diagnosis not present

## 2021-09-03 LAB — CBC
HCT: 36.8 % — ABNORMAL LOW (ref 39.0–52.0)
Hemoglobin: 11.4 g/dL — ABNORMAL LOW (ref 13.0–17.0)
MCH: 26.9 pg (ref 26.0–34.0)
MCHC: 31 g/dL (ref 30.0–36.0)
MCV: 86.8 fL (ref 80.0–100.0)
Platelets: 340 10*3/uL (ref 150–400)
RBC: 4.24 MIL/uL (ref 4.22–5.81)
RDW: 17.2 % — ABNORMAL HIGH (ref 11.5–15.5)
WBC: 16.1 10*3/uL — ABNORMAL HIGH (ref 4.0–10.5)
nRBC: 0 % (ref 0.0–0.2)

## 2021-09-03 LAB — BASIC METABOLIC PANEL
Anion gap: 5 (ref 5–15)
BUN: 41 mg/dL — ABNORMAL HIGH (ref 8–23)
CO2: 36 mmol/L — ABNORMAL HIGH (ref 22–32)
Calcium: 8.8 mg/dL — ABNORMAL LOW (ref 8.9–10.3)
Chloride: 90 mmol/L — ABNORMAL LOW (ref 98–111)
Creatinine, Ser: 0.89 mg/dL (ref 0.61–1.24)
GFR, Estimated: >60 mL/min (ref >60)
Glucose, Bld: 81 mg/dL (ref 70–99)
Potassium: 4.6 mmol/L (ref 3.5–5.1)
Sodium: 131 mmol/L — ABNORMAL LOW (ref 135–145)

## 2021-09-03 LAB — MAGNESIUM: Magnesium: 2.2 mg/dL (ref 1.7–2.4)

## 2021-09-03 MED ORDER — QUETIAPINE FUMARATE 25 MG PO TABS
75.0000 mg | ORAL_TABLET | Freq: Every day | ORAL | Status: DC
Start: 2021-09-03 — End: 2021-09-08
  Administered 2021-09-03 – 2021-09-06 (×4): 75 mg via ORAL
  Filled 2021-09-03 (×5): qty 3

## 2021-09-03 NOTE — TOC Progression Note (Signed)
Transition of Care (TOC) - Progression Note  ? ? ?Patient Details  ?Name: Jeremiah [name] ?MRN: [id number] ?Date of Birth: 09/10/1950 ? ?Transition of Care (TOC) CM/SW Contact  ?Harriet Masson, RN ?Phone Number:[phone] ?09/03/2021, 9:50 AM ? ?Clinical Narrative:    ?RN spoke with pt in length concerning bed offers and pt indicated he only wants to stay in the [place] area. RN confirmed only one bed offer with [facility] however pt request RN case manager contact his friend [name] to make the final decision on his SNF offer. RN spoke with the friend [name] concerning the above conversation and bed offer with [facility]. Louanne Skye accepted the bed offer at [facility].  ? ?Pt is medical stable for discharged via Dr. Billie Ruddy. RN contacted the facility to inquire on admission however only able to leave a HIPAA approved voice message requesting a call back to confirm acceptance and inquire on discharge date for possible admission on the accepted bed. ? ?TOC will continue to follow up and alert provider accordingly on any response for admission. ? ? ?Expected Discharge Plan: [place] ?Barriers to Discharge: Continued Medical Work up ? ?Expected Discharge Plan and Services ?Expected Discharge Plan: [place] ?  ?Discharge Planning Services: CM Consult ?Post Acute Care Choice: Home Health ?Living arrangements for the past 2 months: Mobile Home ?                ?DME Arranged: N/A ?DME Agency: NA ?  ?  ?  ?HH Arranged: RN, PT, OT ?[place] Agency: [facility] ?Date HH Agency Contacted: 08/29/21 ?Time [place]: 9450 ?Representative spoke with at [place]: Juanda Crumble ? ? ?Social Determinants of Health (SDOH) Interventions ?  ? ?Readmission Risk Interventions ? ?  08/29/2021  ?  2:00 PM 10/21/2020  ?  1:14 PM 09/27/2020  ? 11:18 AM  ?Readmission Risk Prevention Plan  ?Transportation Screening Complete Complete Complete  ?PCP or  Specialist Appt within 3-5 Days  Complete Complete  ?[place] or Home Care Consult  Complete Complete  ?Social Work Consult for [place] Planning/Counseling  Complete Not Complete  ?SW consult not completed comments   RNCM assigned to patient  ?Palliative Care Screening  Not Applicable Not Applicable  ?Medication Review Press photographer) Complete Complete Complete  ?PCP or Specialist appointment within 3-5 days of discharge Complete    ?[place] or Home Care Consult Complete    ?SW Recovery Care/Counseling Consult Complete    ?Palliative Care Screening Not Applicable    ?[place] Not Complete    ?SNF Comments waiting on PT recomendation    ? ? ?

## 2021-09-03 NOTE — Progress Notes (Signed)
?Progress Note ? ? ?Patient: Jeremiah [name] [id number]:[id number] DOB: May 23, 1950 DOA: 08/26/2021     8 ?DOS: the patient was seen and examined on 09/03/2021 ?  ?Brief hospital course: ?[name] is a 71 year old male with history of tobacco dependence, depression, anxiety, COPD, hypertension, GERD, chronic respiratory failure on 4 to 5 L nasal cannula at home, who presents emergency department for chief concerns of worsening shortness of breath for 2 weeks. ?Initial vitals in the emergency department showed temperature of 97.3, respiration rate of 25, heart rate of 102, blood pressure 119/76, SPO2 of 98% initially on BiPAP. ? ?At bedside, patient's SPO2 is improved at 97 to 100% on 8 L of high flow nasal cannula. ? ?Serum sodium is 131, potassium 3.8, chloride 86, bicarb of 33, BUN of 14, serum creatinine of 0.48, GFR greater than 60, nonfasting blood glucose 73, WBC elevated at 15.4, hemoglobin 12, platelets of 462.  COVID/influenza A/influenza B PCR were negative ? ?Portable chest x-ray in the emergency department was read as interval increase with right pleural effusion. ? ?CTA of the chest for PE with and without contrast was read as: Marked severity emphysematous lung disease with marked severity of the right lower lobe consolidation.  Large partially loculated right-sided pleural effusion.  New 12 mm x 6 mm spiculated lung nodule versus area of focal scarring within the posterior aspect of the left lower lobe. ? ?ED treatment: DuoNeb x2, Solu-Medrol 125 mg IV one-time dose, aztreonam and vancomycin. ? ?Assessment and Plan: ?* Acute on chronic respiratory failure with hypoxia (HCC) ?--on home 4-5L O2 at baseline.   ?- Initially on BiPAP, now back on 5L O2 ?- Multifactorial including right-sided loculated pleural effusion, community-acquired pneumonia, severe COPD. ?--s/p right thoracentesis  ?--s/p IV Lasix ?Plan: ?--cont tx for PNA, per pulm rec ?--no further standing diuretic, per pulm  ?--Continue  supplemental O2 to keep sats between 88-92% ? ?Insomnia ?--increase seroquel to 75 mg nightly ? ?Hypotension ?BP has been low normal.  Was started on midodrine 10 mg TID. ?--hold home Cardizem and Lisinopril ?--cont midodrine to 5 mg TID ? ?Pleural effusion on right ?- With partial loculation ?--s/p right thora with 1.2L removal ?--outpatient f/u with Dr. Raul Del about final pleural fluid path, to rule out mesothelioma.   ? ?Sepsis due to pneumonia [facility]) ?- Patient meeting severe sepsis criteria with increased heart rate, increased respiration rate, hypoxia requiring BiPAP on initial presentation to the ED, elevated white cell count, source of loculated pleural effusion versus pneumonia ?- abx as above ? ?Depression ?- cont home duloxetine 30 mg daily ? ?Left lower lobe pulmonary nodule ?- On CTA of chest on 08/26/2021, described as spiculated, 12 x 6 mm ?- Discussed extensively with patient at bedside that he will need outpatient follow-up ?- Patient endorses understanding and compliance and repeat the information and instructions to me ? ?Chronic hypoxemic respiratory failure (HCC) ?- On baseline 4 to 5 L nasal cannula ? ?CAP (community acquired pneumonia) ?--MRSA screen pos.  Completed 5 days of Levaquin and IV vanc ?Plan: ?--cont Levaquin  ?--cont Linezolid  ? ?Protein-calorie malnutrition, severe ?- Dietary consulted, supplements per dietician  ? ?Unintentional weight loss ?- Concerning for carcinoma ? ?GERD (gastroesophageal reflux disease) ?- cont home PPI ? ?HTN (hypertension) ?-BP currently low normal ?--hold home cardizem, Lisinopril ? ?Hyponatremia ?- Appears baseline at this time ? ?COPD, severe ([place]) ?- pulm consulted, Dr. Mortimer Fries ?--IV solumedrol transitioned to prednisone  ?Plan: ?--cont prednisone with taper ?--cont Xopenex neb ?--  cont pulmicort neb ?--cont Spiriva ?--morphine PRN for air hunger and resultant anxiety ? ?Tobacco dependency ?- Nicotine patch as needed ordered ? ? ? ? ?  ? ?Subjective:  ?Pt  had more dyspnea during rounds, though O2 requirement and sats at baseline. ? ? ?Physical Exam: ? ?Constitutional: NAD, AAOx3 ?HEENT: conjunctivae and lids normal, EOMI ?CV: No cyanosis.   ?RESP: increased respiratory effort, on 4L ?Extremities: No effusions, edema in BLE ?SKIN: warm, dry ?Neuro: II - XII grossly intact.   ?Psych: depressed mood and affect.  Appropriate judgement and reason ? ? ? ?Data Reviewed: ? ?Family Communication:  ? ?Disposition: ?Status is: Inpatient ? ? Planned Discharge Destination: Rehab  ? ? ? ? ?Time spent: 35 minutes ? ?Author: ?Enzo Bi, MD ?09/03/2021 7:04 PM ? ?For on call review [email or web address]  ?

## 2021-09-04 DIAGNOSIS — J9621 Acute and chronic respiratory failure with hypoxia: Secondary | ICD-10-CM | POA: Diagnosis not present

## 2021-09-04 MED ORDER — POLYETHYLENE GLYCOL 3350 17 G PO PACK
17.0000 g | PACK | Freq: Two times a day (BID) | ORAL | Status: DC
  Administered 2021-09-04 – 2021-09-07 (×4): 17 g via ORAL
  Filled 2021-09-04 (×7): qty 1

## 2021-09-04 MED ORDER — TRAMADOL HCL 50 MG PO TABS
50.0000 mg | ORAL_TABLET | Freq: Four times a day (QID) | ORAL | Status: DC | PRN
  Administered 2021-09-04 – 2021-09-08 (×11): 50 mg via ORAL
  Filled 2021-09-04 (×11): qty 1

## 2021-09-04 NOTE — Progress Notes (Signed)
?Progress Note ? ? ?Patient: Jeremiah [name] EVO:[id number] DOB: 10-28-1950 DOA: 08/26/2021     9 ?DOS: the patient was seen and examined on 09/04/2021 ?  ?Brief hospital course: ?[name] is a 71 year old male with history of tobacco dependence, depression, anxiety, COPD, hypertension, GERD, chronic respiratory failure on 4 to 5 L nasal cannula at home, who presents emergency department for chief concerns of worsening shortness of breath for 2 weeks. ?Initial vitals in the emergency department showed temperature of 97.3, respiration rate of 25, heart rate of 102, blood pressure 119/76, SPO2 of 98% initially on BiPAP. ? ?At bedside, patient's SPO2 is improved at 97 to 100% on 8 L of high flow nasal cannula. ? ?Serum sodium is 131, potassium 3.8, chloride 86, bicarb of 33, BUN of 14, serum creatinine of 0.48, GFR greater than 60, nonfasting blood glucose 73, WBC elevated at 15.4, hemoglobin 12, platelets of 462.  COVID/influenza A/influenza B PCR were negative ? ?Portable chest x-ray in the emergency department was read as interval increase with right pleural effusion. ? ?CTA of the chest for PE with and without contrast was read as: Marked severity emphysematous lung disease with marked severity of the right lower lobe consolidation.  Large partially loculated right-sided pleural effusion.  New 12 mm x 6 mm spiculated lung nodule versus area of focal scarring within the posterior aspect of the left lower lobe. ? ?ED treatment: DuoNeb x2, Solu-Medrol 125 mg IV one-time dose, aztreonam and vancomycin. ? ?Assessment and Plan: ?* Acute on chronic respiratory failure with hypoxia (HCC) ?--on home 4-5L O2 at baseline.   ?- Initially on BiPAP, now back on 5L O2 ?- Multifactorial including right-sided loculated pleural effusion, community-acquired pneumonia, severe COPD. ?--s/p right thoracentesis  ?--s/p IV Lasix ?Plan: ?--cont tx for PNA, per pulm rec ?--no further standing diuretic, per pulm  ?--Continue  supplemental O2 to keep sats between 88-92% ? ?Insomnia ?--cont seroquel to 75 mg nightly ? ?Hypotension ?BP has been low normal.  Was started on midodrine 10 mg TID. ?--hold home Cardizem and Lisinopril ?--cont midodrine to 5 mg TID ? ?Pleural effusion on right ?- With partial loculation ?--s/p right thora with 1.2L removal ?--outpatient f/u with Dr. Raul Del about final pleural fluid path, to rule out mesothelioma.   ? ?Sepsis due to pneumonia [facility]) ?- Patient meeting severe sepsis criteria with increased heart rate, increased respiration rate, hypoxia requiring BiPAP on initial presentation to the ED, elevated white cell count, source of loculated pleural effusion versus pneumonia ?- abx as above ? ?Depression ?- cont home duloxetine 30 mg daily ? ?Left lower lobe pulmonary nodule ?- On CTA of chest on 08/26/2021, described as spiculated, 12 x 6 mm ?- Discussed extensively with patient at bedside that he will need outpatient follow-up ?- Patient endorses understanding and compliance and repeat the information and instructions to me ? ?Chronic hypoxemic respiratory failure (HCC) ?- On baseline 4 to 5 L nasal cannula ? ?CAP (community acquired pneumonia) ?--MRSA screen pos.  ?Plan: ?--10 day total course, per pulm rec ?--cont Levaquin  ?--cont Linezolid  ? ?Protein-calorie malnutrition, severe ?- Dietary consulted, supplements per dietician  ? ?Unintentional weight loss ?- Concerning for carcinoma ? ?GERD (gastroesophageal reflux disease) ?- cont home PPI ? ?HTN (hypertension) ?-BP currently low normal ?--hold home cardizem, Lisinopril ? ?Hyponatremia ?- Appears baseline at this time ? ?COPD, severe ([place]) ?- pulm consulted, Dr. Mortimer Fries ?--IV solumedrol transitioned to prednisone  ?Plan: ?--cont prednisone with taper ?--cont Xopenex neb ?--cont  pulmicort neb ?--cont Spiriva ?--d/c morphine since pt doesn't want ? ?Tobacco dependency ?- Nicotine patch as needed ordered ? ? ? ? ?  ? ?Subjective:  ?Pt reported issues with  anxiety.  Did sleep more with seroquel.  Preferred to switch back to tramadol because he associated morphine with making someone die. ? ? ?Physical Exam: ? ?Constitutional: NAD, AAOx3 ?HEENT: conjunctivae and lids normal, EOMI ?CV: No cyanosis.   ?RESP: increased respiratory effort, on 5L ?Extremities: No effusions, edema in BLE ?SKIN: warm, dry ?Neuro: II - XII grossly intact.   ?Psych: depressed mood and affect.  Appropriate judgement and reason ? ? ? ?Data Reviewed: ? ?Family Communication:  ? ?Disposition: ?Status is: Inpatient ? ? Planned Discharge Destination: Rehab  ? ? ? ? ?Time spent: 35 minutes ? ?Author: ?Enzo Bi, MD ?09/04/2021 7:56 PM ? ?For on call review [email or web address].  ?

## 2021-09-04 NOTE — TOC Progression Note (Signed)
Transition of Care (TOC) - Progression Note  ? ? ?Patient Details  ?Name: Jeremiah [name] ?MRN: [id number] ?Date of Birth: [birth year] ? ?Transition of Care (TOC) CM/SW Contact  ?Harriet Masson, RN ?Phone Number: ?09/04/2021, 9:46 AM ? ?Clinical Narrative:    ?Received a message from [facility]) indicating no weekend acceptance and requested a follow up on Monday. Attending updated accordingly. ? ?TOC will follow up on Monday for possible admission into [facility].  ? ? ?Expected Discharge Plan: [place] ?Barriers to Discharge: Continued Medical Work up ? ?Expected Discharge Plan and Services ?Expected Discharge Plan: [place] ?  ?Discharge Planning Services: CM Consult ?Post Acute Care Choice: Home Health ?Living arrangements for the past 2 months: Mobile Home ?                ?DME Arranged: N/A ?DME Agency: NA ?  ?  ?  ?HH Arranged: RN, PT, OT ?[place] Agency: [facility] ?Date HH Agency Contacted: 08/29/21 ?Time [place]: [phone] ?Representative spoke with at [place]: Juanda Crumble ? ? ?Social Determinants of Health (SDOH) Interventions ?  ? ?Readmission Risk Interventions ? ?  08/29/2021  ?  2:00 PM 10/21/2020  ?  1:14 PM 09/27/2020  ? 11:18 AM  ?Readmission Risk Prevention Plan  ?Transportation Screening Complete Complete Complete  ?PCP or Specialist Appt within 3-5 Days  Complete Complete  ?[place] or Home Care Consult  Complete Complete  ?Social Work Consult for [place] Planning/Counseling  Complete Not Complete  ?SW consult not completed comments   RNCM assigned to patient  ?Palliative Care Screening  Not Applicable Not Applicable  ?Medication Review Press photographer) Complete Complete Complete  ?PCP or Specialist appointment within 3-5 days of discharge Complete    ?[place] or Home Care Consult Complete    ?SW Recovery Care/Counseling Consult Complete    ?Palliative Care Screening Not Applicable    ?[place] Not Complete    ?SNF  Comments waiting on PT recomendation    ? ? ?

## 2021-09-05 DIAGNOSIS — J9621 Acute and chronic respiratory failure with hypoxia: Secondary | ICD-10-CM | POA: Diagnosis not present

## 2021-09-05 MED ORDER — ACETYLCYSTEINE 20 % IN SOLN
4.0000 mL | Freq: Two times a day (BID) | RESPIRATORY_TRACT | Status: DC
  Administered 2021-09-05 – 2021-09-08 (×5): 4 mL via RESPIRATORY_TRACT
  Filled 2021-09-05 (×6): qty 4

## 2021-09-05 NOTE — Progress Notes (Signed)
Physical Therapy Treatment ?Patient Details ?Name: Jeremiah [name] ?MRN: [id number] ?DOB: [birth year] ?Today's Date: 09/05/2021 ? ? ?History of Present Illness [name] is a 71 year old male with history of tobacco dependence, depression, anxiety, COPD, hypertension, GERD, chronic respiratory failure on 4 to 5 L nasal cannula at home, who presents emergency department for chief concerns of worsening shortness of breath for 2 weeks. ? ?  ?PT Comments  ? ? Pt is making limited progress towards goals secondary to high anxiety with all mobility tasks. Pt reports he is unable to attempt OOB mobility this date as his lungs aren't ready. Heavy education given along with encouragement. Pt willing to perform supine there-ex. O2 sats at 90% on 4L of O2 with exertion. Will continue to progress as able.  ?Recommendations for follow up therapy are one component of a multi-disciplinary discharge planning process, led by the attending physician.  Recommendations may be updated based on patient status, additional functional criteria and insurance authorization. ? ?Follow Up Recommendations ? Skilled nursing-short term rehab (<3 hours/day) ?  ?  ?Assistance Recommended at Discharge Frequent or constant Supervision/Assistance  ?Patient can return home with the following A lot of help with walking and/or transfers;A lot of help with bathing/dressing/bathroom ?  ?Equipment Recommendations ? Rolling walker (2 wheels)  ?  ?Recommendations for Other Services   ? ? ?  ?Precautions / Restrictions Precautions ?Precautions: None ?Restrictions ?Weight Bearing Restrictions: No  ?  ? ?Mobility ? Bed Mobility ?  ?  ?  ?  ?  ?  ?  ?General bed mobility comments: refuses ?  ? ?Transfers ?  ?  ?  ?  ?  ?  ?  ?  ?  ?  ?  ? ?Ambulation/Gait ?  ?  ?  ?  ?  ?  ?  ?  ? ? ?Stairs ?  ?  ?  ?  ?  ? ? ?Wheelchair Mobility ?  ? ?Modified Rankin (Stroke Patients Only) ?  ? ? ?  ?Balance   ?  ?  ?  ?  ?  ?  ?  ?  ?  ?  ?  ?  ?  ?  ?  ?  ?  ?  ?  ? ?   ?Cognition Arousal/Alertness: Awake/alert ?Behavior During Therapy: Anxious ?Overall Cognitive Status: Within Functional Limits for tasks assessed ?  ?  ?  ?  ?  ?  ?  ?  ?  ?  ?  ?  ?  ?  ?  ?  ?General Comments: pleasant, particular and hyperfocused on O2 ?  ?  ? ?  ?Exercises Other Exercises ?Other Exercises: supine ther-ex performed on B LE including AP, SLRs, SAQ, and hip add squeezes. Multiple rest breaks given throughout session due to increased WOB. 10 reps. Pt declined further activity/ther-ex. O2 sats at 90% on 4L of O2 throughout ? ?  ?General Comments   ?  ?  ? ?Pertinent Vitals/Pain Pain Assessment ?Pain Assessment: No/denies pain  ? ? ?Home Living   ?  ?  ?  ?  ?  ?  ?  ?  ?  ?   ?  ?Prior Function    ?  ?  ?   ? ?PT Goals (current goals can now be found in the care plan section) Acute Rehab PT Goals ?Patient Stated Goal: to go home. ?PT Goal Formulation: With patient ?Time For Goal Achievement: 09/13/21 ?Potential to Achieve Goals: Poor ?  Progress towards PT goals: Progressing toward goals ? ?  ?Frequency ? ? ? Min 2X/week ? ? ? ?  ?PT Plan Current plan remains appropriate  ? ? ?Co-evaluation   ?  ?  ?  ?  ? ?  ?AM-PAC PT "6 Clicks" Mobility   ?Outcome Measure ? Help needed turning from your back to your side while in a flat bed without using bedrails?: A Little ?Help needed moving from lying on your back to sitting on the side of a flat bed without using bedrails?: A Little ?Help needed moving to and from a bed to a chair (including a wheelchair)?: A Lot ?Help needed standing up from a chair using your arms (e.g., wheelchair or bedside chair)?: A Lot ?Help needed to walk in hospital room?: Total ?Help needed climbing 3-5 steps with a railing? : Total ?6 Click Score: 12 ? ?  ?End of Session Equipment Utilized During Treatment: Oxygen ?Activity Tolerance: Patient limited by fatigue ?Patient left: in bed;with bed alarm set ?Nurse Communication: Mobility status ?PT Visit Diagnosis: Unsteadiness on feet  (R26.81);Other abnormalities of gait and mobility (R26.89);Muscle weakness (generalized) (M62.81);Difficulty in walking, not elsewhere classified (R26.2) ?  ? ? ?Time: 1610-9604 ?PT Time Calculation (min) (ACUTE ONLY): 14 min ? ?Charges:  $Therapeutic Exercise: 8-22 mins          ?          ? ?Jeremiah [name], PT, DPT, GCS ?[phone] ? ? ? ?Jeremiah [name] ?09/05/2021, 12:24 PM ? ?

## 2021-09-05 NOTE — Progress Notes (Signed)
Occupational Therapy Treatment ?Patient Details ?Name: Jeremiah [name] ?MRN: [id number] ?DOB: 31-Jan-1951 ?Today's Date: 09/05/2021 ? ? ?History of present illness [name] is a 71 year old male with history of tobacco dependence, depression, anxiety, COPD, hypertension, GERD, chronic respiratory failure on 4 to 5 L nasal cannula at home, who presents emergency department for chief concerns of worsening shortness of breath for 2 weeks. ?  ?OT comments ? Jeremiah [name] continues to present with limited endurance and anxiety that impacts his engagement in functional tasks.  He presents to OT polite but anxious, requiring significant encouragement to participate in therapeutic activity.  OT provided encouragement and education re: benefits of therapeutic activity, as well as relaxation and compensatory strategies.  OT provided education re: use of pulse ox, energy conservation strategies, and lifestyle modifications to support safety and independence in self-care tasks.  Patient was moderately receptive, but remained anxious re: shortness of breath and chronic condition.  He declined OOB mobility despite max encouragement from OT.  Jeremiah [name] will continue to benefit from skilled OT services in acute setting to support functional strengthening, endurance, and safety and independence in ADLs.  SNF remains most appropriate discharge recommendation.  ? ?Recommendations for follow up therapy are one component of a multi-disciplinary discharge planning process, led by the attending physician.  Recommendations may be updated based on patient status, additional functional criteria and insurance authorization. ?   ?Follow Up Recommendations ? Skilled nursing-short term rehab (<3 hours/day)  ?  ?Assistance Recommended at Discharge    ?Patient can return home with the following ? A lot of help with walking and/or transfers;A lot of help with bathing/dressing/bathroom;Assistance with cooking/housework;Help with stairs or ramp for  entrance;Assist for transportation ?  ?Equipment Recommendations ? None recommended by OT  ?  ?Recommendations for Other Services   ? ?  ?Precautions / Restrictions Precautions ?Precautions: None ?Precaution Comments: mod fall risk ?Restrictions ?Weight Bearing Restrictions: No  ? ? ?  ? ?Mobility Bed Mobility ?Overal bed mobility: Needs Assistance ?  ?  ?  ?  ?  ?  ?  ?Patient Response: Anxious ? ?Transfers ?  ?  ?  ?  ?  ?  ?  ?  ?  ?General transfer comment: declines attempts ?  ?  ?Balance Overall balance assessment: Needs assistance ?  ?  ?  ?  ?  ?  ?  ?  ?  ?  ?  ?  ?  ?  ?  ?  ?  ?  ?   ? ?ADL either performed or assessed with clinical judgement  ? ?ADL Overall ADL's : Needs assistance/impaired ?  ?  ?  ?  ?  ?  ?  ?  ?  ?  ?  ?  ?  ?  ?  ?  ?  ?  ?  ?  ?  ? ?Extremity/Trunk Assessment Upper Extremity Assessment ?Upper Extremity Assessment: Generalized weakness ?  ?Lower Extremity Assessment ?Lower Extremity Assessment: Generalized weakness ?  ?  ?  ? ?Vision   ?  ?  ?Perception   ?  ?Praxis   ?  ? ?Cognition Arousal/Alertness: Awake/alert ?Behavior During Therapy: Anxious ?Overall Cognitive Status: Within Functional Limits for tasks assessed ?  ?  ?  ?  ?  ?  ?  ?  ?  ?  ?  ?  ?  ?  ?  ?  ?General Comments: polite but anxious re: shortness of breath and O2 stats ?  ?  ?   ?  Exercises Other Exercises ?Other Exercises: provided education re: energy conservation strategies, pursed lip breathing, relaxation, compensatory strategies, use of pulse ox ? ?  ?Shoulder Instructions   ? ? ?  ?General Comments SpO2 at 84% on 4 L, SpO2 at 91% on 5L  ? ? ?Pertinent Vitals/ Pain       Pain Assessment ?Pain Assessment: No/denies pain ?Pain Location: denied pain, but reports chronic shortness of breath impairing function ?Pain Intervention(s): Limited activity within patient's tolerance, Monitored during session, Repositioned, Relaxation ? ?Home Living   ?  ?  ?  ?  ?  ?  ?  ?  ?  ?  ?  ?  ?  ?  ?  ?  ?  ?  ? ?  ?Prior  Functioning/Environment    ?  ?  ?  ?   ? ?Frequency ? Min 2X/week  ? ? ? ? ?  ?Progress Toward Goals ? ?OT Goals(current goals can now be found in the care plan section) ? Progress towards OT goals: Progressing toward goals ? ?Acute Rehab OT Goals ?Patient Stated Goal: to improve breathing ?OT Goal Formulation: With patient ?Time For Goal Achievement: 09/14/21 ?Potential to Achieve Goals: Good  ?Plan Discharge plan remains appropriate;Frequency remains appropriate   ? ?Co-evaluation ? ? ?   ?  ?  ?  ?  ? ?  ?AM-PAC OT "6 Clicks" Daily Activity     ?Outcome Measure ? ? Help from another person eating meals?: A Little ?Help from another person taking care of personal grooming?: A Lot ?Help from another person toileting, which includes using toliet, bedpan, or urinal?: A Lot ?Help from another person bathing (including washing, rinsing, drying)?: A Lot ?Help from another person to put on and taking off regular upper body clothing?: A Lot ?Help from another person to put on and taking off regular lower body clothing?: A Lot ?6 Click Score: 13 ? ?  ?End of Session   ? ?OT Visit Diagnosis: Unsteadiness on feet (R26.81);Muscle weakness (generalized) (M62.81) ?  ?Activity Tolerance   ?  ?Patient Left in bed;with call bell/phone within reach;with bed alarm set ?  ?Nurse Communication   ?  ? ?   ? ?Time: 7741-4239 ?OT Time Calculation (min): 21 min ? ?Charges: OT General Charges ?$OT Visit: 1 Visit ?OT Treatments ?$Self Care/Home Management : 8-22 mins ? ?Jeneen Montgomery, OTR/L ?09/05/21, 3:11 PM ? ?

## 2021-09-05 NOTE — TOC Progression Note (Signed)
Transition of Care (TOC) - Progression Note  ? ? ?Patient Details  ?Name: Jeremiah [name] ?MRN: [id number] ?Date of Birth: 12/09/1950 ? ?Transition of Care (TOC) CM/SW Contact  ?Savon Bordonaro A Bonny Vanleeuwen, LCSW ?Phone Number: ?09/05/2021, 11:41 AM ? ?Clinical Narrative:    ?Auth was not started over the weekend. CSW has started [place].  ? ? ?Expected Discharge Plan: [place] ?Barriers to Discharge: Continued Medical Work up ? ?Expected Discharge Plan and Services ?Expected Discharge Plan: [place] ?  ?Discharge Planning Services: CM Consult ?Post Acute Care Choice: Home Health ?Living arrangements for the past 2 months: Mobile Home ?                ?DME Arranged: N/A ?DME Agency: NA ?  ?  ?  ?HH Arranged: RN, PT, OT ?[place] Agency: [facility] ?Date HH Agency Contacted: 08/29/21 ?Time [place]: 9379 ?Representative spoke with at [place]: Juanda Crumble ? ? ?Social Determinants of Health (SDOH) Interventions ?  ? ?Readmission Risk Interventions ? ?  08/29/2021  ?  2:00 PM 10/21/2020  ?  1:14 PM 09/27/2020  ? 11:18 AM  ?Readmission Risk Prevention Plan  ?Transportation Screening Complete Complete Complete  ?PCP or Specialist Appt within 3-5 Days  Complete Complete  ?[place] or Home Care Consult  Complete Complete  ?Social Work Consult for [place] Planning/Counseling  Complete Not Complete  ?SW consult not completed comments   RNCM assigned to patient  ?Palliative Care Screening  Not Applicable Not Applicable  ?Medication Review Press photographer) Complete Complete Complete  ?PCP or Specialist appointment within 3-5 days of discharge Complete    ?[place] or Home Care Consult Complete    ?SW Recovery Care/Counseling Consult Complete    ?Palliative Care Screening Not Applicable    ?[place] Not Complete    ?SNF Comments waiting on PT recomendation    ? ? ?

## 2021-09-05 NOTE — Progress Notes (Signed)
Patient's friend [name] called inquiring about discharge information. Spoke with patient who gave permission for nurse to speak with [name]. ? ?Discussed adding a password to his account but patient refused.  ?

## 2021-09-05 NOTE — TOC Transition Note (Incomplete)
Transition of Care (TOC) - CM/SW Discharge Note ? ? ?Patient Details  ?Name: Jeremiah [name] ?MRN: [id number] ?Date of Birth: [birth year] ? ?Transition of Care (TOC) CM/SW Contact:  ?Lamir Racca A Lory Nowaczyk, LCSW ?Phone Number: ?09/05/2021, 12:06 PM ? ? ?Clinical Narrative:    ? ? ? ?  ?Barriers to Discharge: Continued Medical Work up ? ? ?Patient Goals and CMS Choice ?Patient states their goals for this hospitalization and ongoing recovery are:: to feel better and get home ?CMS Medicare.gov Compare Post Acute Care list provided to:: Patient ?Choice offered to / list presented to : Patient ? ?Discharge Placement ?  ?           ?  ?  ?  ?  ? ?Discharge Plan and Services ?  ?Discharge Planning Services: CM Consult ?Post Acute Care Choice: Home Health          ?DME Arranged: N/A ?DME Agency: NA ?  ?  ?  ?HH Arranged: RN, PT, OT ?[place] Agency: [facility] ?Date HH Agency Contacted: 08/29/21 ?Time [place]: 2446 ?Representative spoke with at [place]: Juanda Crumble ? ?Social Determinants of Health (SDOH) Interventions ?  ? ? ?Readmission Risk Interventions ? ?  08/29/2021  ?  2:00 PM 10/21/2020  ?  1:14 PM 09/27/2020  ? 11:18 AM  ?Readmission Risk Prevention Plan  ?Transportation Screening Complete Complete Complete  ?PCP or Specialist Appt within 3-5 Days  Complete Complete  ?[place] or Home Care Consult  Complete Complete  ?Social Work Consult for [place] Planning/Counseling  Complete Not Complete  ?SW consult not completed comments   RNCM assigned to patient  ?Palliative Care Screening  Not Applicable Not Applicable  ?Medication Review Press photographer) Complete Complete Complete  ?PCP or Specialist appointment within 3-5 days of discharge Complete    ?[place] or Home Care Consult Complete    ?SW Recovery Care/Counseling Consult Complete    ?Palliative Care Screening Not Applicable    ?[place] Not Complete    ?SNF Comments waiting on PT recomendation    ? ? ? ? ? ?

## 2021-09-05 NOTE — Progress Notes (Signed)
?Progress Note ? ? ?Patient: Jeremiah [name] HER:[id number] DOB: 1950/08/30 DOA: 08/26/2021     10 ?DOS: the patient was seen and examined on 09/05/2021 ?  ?Brief hospital course: ?[name] is a 71 year old male with history of tobacco dependence, depression, anxiety, COPD, hypertension, GERD, chronic respiratory failure on 4 to 5 L nasal cannula at home, who presents emergency department for chief concerns of worsening shortness of breath for 2 weeks. ?Initial vitals in the emergency department showed temperature of 97.3, respiration rate of 25, heart rate of 102, blood pressure 119/76, SPO2 of 98% initially on BiPAP. ? ?At bedside, patient's SPO2 is improved at 97 to 100% on 8 L of high flow nasal cannula. ? ?Serum sodium is 131, potassium 3.8, chloride 86, bicarb of 33, BUN of 14, serum creatinine of 0.48, GFR greater than 60, nonfasting blood glucose 73, WBC elevated at 15.4, hemoglobin 12, platelets of 462.  COVID/influenza A/influenza B PCR were negative ? ?Portable chest x-ray in the emergency department was read as interval increase with right pleural effusion. ? ?CTA of the chest for PE with and without contrast was read as: Marked severity emphysematous lung disease with marked severity of the right lower lobe consolidation.  Large partially loculated right-sided pleural effusion.  New 12 mm x 6 mm spiculated lung nodule versus area of focal scarring within the posterior aspect of the left lower lobe. ? ?ED treatment: DuoNeb x2, Solu-Medrol 125 mg IV one-time dose, aztreonam and vancomycin. ? ?Assessment and Plan: ?* Acute on chronic respiratory failure with hypoxia (HCC) ?--on home 4-5L O2 at baseline.   ?- Initially on BiPAP, now back on 5L O2 ?- Multifactorial including right-sided loculated pleural effusion, community-acquired pneumonia, severe COPD. ?--s/p right thoracentesis  ?--s/p IV Lasix ?Plan: ?--cont tx for PNA, per pulm rec ?--no further standing diuretic, per pulm  ?--Continue  supplemental O2 to keep sats between 88-92% ? ?Insomnia ?--cont seroquel to 75 mg nightly ? ?Hypotension ?BP has been low normal.  Was started on midodrine 10 mg TID. ?--hold home Cardizem and Lisinopril ?--cont midodrine to 5 mg TID ? ?Pleural effusion on right ?- With partial loculation ?--s/p right thora with 1.2L removal ?--outpatient f/u with Dr. Raul Del about final pleural fluid path, to rule out mesothelioma.   ? ?Sepsis due to pneumonia [facility]) ?- Patient meeting severe sepsis criteria with increased heart rate, increased respiration rate, hypoxia requiring BiPAP on initial presentation to the ED, elevated white cell count, source of loculated pleural effusion versus pneumonia ?- abx as above ? ?Depression ?- cont home duloxetine 30 mg daily ? ?Left lower lobe pulmonary nodule ?- On CTA of chest on 08/26/2021, described as spiculated, 12 x 6 mm ?- Discussed extensively with patient at bedside that he will need outpatient follow-up ?- Patient endorses understanding and compliance and repeat the information and instructions to me ? ?Chronic hypoxemic respiratory failure (HCC) ?- On baseline 4 to 5 L nasal cannula ? ?CAP (community acquired pneumonia) ?--MRSA screen pos.  ?Plan: ?--10 day total course, per pulm rec ?--cont Levaquin  ?--cont Linezolid  ? ?Protein-calorie malnutrition, severe ?- Dietary consulted, supplements per dietician  ? ?Unintentional weight loss ?- Concerning for carcinoma ? ?GERD (gastroesophageal reflux disease) ?- cont home PPI ? ?HTN (hypertension) ?-BP currently low normal ?--hold home cardizem, Lisinopril ? ?Hyponatremia ?- Appears baseline at this time ? ?COPD, severe ([place]) ?- pulm consulted, Dr. Mortimer Fries ?--IV solumedrol transitioned to prednisone  ?Plan: ?--cont prednisone with taper ?--cont Xopenex neb ?--cont  pulmicort neb ?--cont Spiriva ?--add Mucomyst neb BID for mucus clearance ? ?Tobacco dependency ?- Nicotine patch as needed ordered ? ? ? ? ?  ? ?Subjective:  ?Pt reported having  a hard time bringing up his sputum.   ? ? ?Physical Exam: ? ?Constitutional: NAD, AAOx3 ?HEENT: conjunctivae and lids normal, EOMI ?CV: No cyanosis.   ?RESP: increased respiratory effort, on 4L ?Extremities: No effusions, edema in BLE ?SKIN: warm, dry ?Neuro: II - XII grossly intact.   ?Psych: Normal mood and affect.   ? ? ?Data Reviewed: ? ?Family Communication:  ? ?Disposition: ?Status is: Inpatient ? ? Planned Discharge Destination: Rehab  ? ? ? ? ?Time spent: 25 minutes ? ?Author: ?Jeremiah Bi, MD ?09/05/2021 6:58 PM ? ?For on call review [email or web address].  ?

## 2021-09-05 NOTE — TOC Progression Note (Signed)
Transition of Care (TOC) - Progression Note  ? ? ?Patient Details  ?Name: Jeremiah [name] ?MRN: [id number] ?Date of Birth: [birth year] ? ?Transition of Care (TOC) CM/SW Contact  ?Santiana Glidden A Vernon Ariel, LCSW ?Phone Number: ?09/05/2021, 3:23 PM ? ?Clinical Narrative:   Auth for [facility] still pending.  ? ? ? ?Expected Discharge Plan: [place] ?Barriers to Discharge: Continued Medical Work up ? ?Expected Discharge Plan and Services ?Expected Discharge Plan: [place] ?  ?Discharge Planning Services: CM Consult ?Post Acute Care Choice: Home Health ?Living arrangements for the past 2 months: Mobile Home ?                ?DME Arranged: N/A ?DME Agency: NA ?  ?  ?  ?HH Arranged: RN, PT, OT ?[place] Agency: [facility] ?Date HH Agency Contacted: 08/29/21 ?Time [place]: [id number] ?Representative spoke with at [place]: Juanda Crumble ? ? ?Social Determinants of Health (SDOH) Interventions ?  ? ?Readmission Risk Interventions ? ?  08/29/2021  ?  2:00 PM 10/21/2020  ?  1:14 PM 09/27/2020  ? 11:18 AM  ?Readmission Risk Prevention Plan  ?Transportation Screening Complete Complete Complete  ?PCP or Specialist Appt within 3-5 Days  Complete Complete  ?[place] or Home Care Consult  Complete Complete  ?Social Work Consult for [place] Planning/Counseling  Complete Not Complete  ?SW consult not completed comments   RNCM assigned to patient  ?Palliative Care Screening  Not Applicable Not Applicable  ?Medication Review Press photographer) Complete Complete Complete  ?PCP or Specialist appointment within 3-5 days of discharge Complete    ?[place] or Home Care Consult Complete    ?SW Recovery Care/Counseling Consult Complete    ?Palliative Care Screening Not Applicable    ?[place] Not Complete    ?SNF Comments waiting on PT recomendation    ? ? ?

## 2021-09-06 DIAGNOSIS — J9621 Acute and chronic respiratory failure with hypoxia: Secondary | ICD-10-CM | POA: Diagnosis not present

## 2021-09-06 LAB — BASIC METABOLIC PANEL
Anion gap: 4 — ABNORMAL LOW (ref 5–15)
BUN: 33 mg/dL — ABNORMAL HIGH (ref 8–23)
CO2: 41 mmol/L — ABNORMAL HIGH (ref 22–32)
Calcium: 9.2 mg/dL (ref 8.9–10.3)
Chloride: 89 mmol/L — ABNORMAL LOW (ref 98–111)
Creatinine, Ser: 0.77 mg/dL (ref 0.61–1.24)
GFR, Estimated: >60 mL/min (ref >60)
Glucose, Bld: 85 mg/dL (ref 70–99)
Potassium: 4.9 mmol/L (ref 3.5–5.1)
Sodium: 134 mmol/L — ABNORMAL LOW (ref 135–145)

## 2021-09-06 LAB — CBC
HCT: 36.3 % — ABNORMAL LOW (ref 39.0–52.0)
Hemoglobin: 11.3 g/dL — ABNORMAL LOW (ref 13.0–17.0)
MCH: 27.3 pg (ref 26.0–34.0)
MCHC: 31.1 g/dL (ref 30.0–36.0)
MCV: 87.7 fL (ref 80.0–100.0)
Platelets: 287 10*3/uL (ref 150–400)
RBC: 4.14 MIL/uL — ABNORMAL LOW (ref 4.22–5.81)
RDW: 17 % — ABNORMAL HIGH (ref 11.5–15.5)
WBC: 21.8 10*3/uL — ABNORMAL HIGH (ref 4.0–10.5)
nRBC: 0 % (ref 0.0–0.2)

## 2021-09-06 LAB — MAGNESIUM: Magnesium: 2.1 mg/dL (ref 1.7–2.4)

## 2021-09-06 MED ORDER — PREDNISONE 20 MG PO TABS
20.0000 mg | ORAL_TABLET | Freq: Every day | ORAL | Status: DC
  Administered 2021-09-07 – 2021-09-08 (×2): 20 mg via ORAL
  Filled 2021-09-06 (×2): qty 1

## 2021-09-06 NOTE — TOC Progression Note (Signed)
Transition of Care (TOC) - Progression Note  ? ? ?Patient Details  ?Name: Jeremiah [name] ?MRN: [id number] ?Date of Birth: [birth year] ? ?Transition of Care (TOC) CM/SW Contact  ?Sarinah Doetsch A Glyn Gerads, LCSW ?Phone Number: ?09/06/2021, 9:02 AM ? ?Clinical Narrative:   Josem Kaufmann still pending. Updated clinicals sent.  ? ? ? ?Expected Discharge Plan: [place] ?Barriers to Discharge: Continued Medical Work up ? ?Expected Discharge Plan and Services ?Expected Discharge Plan: [place] ?  ?Discharge Planning Services: CM Consult ?Post Acute Care Choice: Home Health ?Living arrangements for the past 2 months: Mobile Home ?                ?DME Arranged: N/A ?DME Agency: NA ?  ?  ?  ?HH Arranged: RN, PT, OT ?[place] Agency: [facility] ?Date HH Agency Contacted: 08/29/21 ?Time [place]: [id number] ?Representative spoke with at [place]: Juanda Crumble ? ? ?Social Determinants of Health (SDOH) Interventions ?  ? ?Readmission Risk Interventions ? ?  08/29/2021  ?  2:00 PM 10/21/2020  ?  1:14 PM 09/27/2020  ? 11:18 AM  ?Readmission Risk Prevention Plan  ?Transportation Screening Complete Complete Complete  ?PCP or Specialist Appt within 3-5 Days  Complete Complete  ?[place] or Home Care Consult  Complete Complete  ?Social Work Consult for [place] Planning/Counseling  Complete Not Complete  ?SW consult not completed comments   RNCM assigned to patient  ?Palliative Care Screening  Not Applicable Not Applicable  ?Medication Review Press photographer) Complete Complete Complete  ?PCP or Specialist appointment within 3-5 days of discharge Complete    ?[place] or Home Care Consult Complete    ?SW Recovery Care/Counseling Consult Complete    ?Palliative Care Screening Not Applicable    ?[place] Not Complete    ?SNF Comments waiting on PT recomendation    ? ? ?

## 2021-09-06 NOTE — Plan of Care (Signed)

## 2021-09-06 NOTE — Progress Notes (Signed)
?Progress Note ? ? ?Patient: Jeremiah [name] TKZ:[id number] DOB: 11-21-1950 DOA: 08/26/2021     11 ?DOS: the patient was seen and examined on 09/06/2021 ?  ?Brief hospital course: ?Jeremiah [name] is a 71 year old male with history of tobacco dependence, depression, anxiety, COPD, hypertension, GERD, chronic respiratory failure on 4 to 5 L nasal cannula at home, who presents emergency department for chief concerns of worsening shortness of breath for 2 weeks. ?Initial vitals in the emergency department showed temperature of 97.3, respiration rate of 25, heart rate of 102, blood pressure 119/76, SPO2 of 98% initially on BiPAP. ? ?At bedside, patient's SPO2 is improved at 97 to 100% on 8 L of high flow nasal cannula. ? ?Serum sodium is 131, potassium 3.8, chloride 86, bicarb of 33, BUN of 14, serum creatinine of 0.48, GFR greater than 60, nonfasting blood glucose 73, WBC elevated at 15.4, hemoglobin 12, platelets of 462.  COVID/influenza A/influenza B PCR were negative ? ?Portable chest x-ray in the emergency department was read as interval increase with right pleural effusion. ? ?CTA of the chest for PE with and without contrast was read as: Marked severity emphysematous lung disease with marked severity of the right lower lobe consolidation.  Large partially loculated right-sided pleural effusion.  New 12 mm x 6 mm spiculated lung nodule versus area of focal scarring within the posterior aspect of the left lower lobe. ? ?ED treatment: DuoNeb x2, Solu-Medrol 125 mg IV one-time dose, aztreonam and vancomycin. ? ?Assessment and Plan: ?* Acute on chronic respiratory failure with hypoxia (HCC) ?--on home 4-5L O2 at baseline.   ?- Initially on BiPAP, now back on 5L O2 ?- Multifactorial including right-sided loculated pleural effusion, community-acquired pneumonia, severe COPD. ?--s/p right thoracentesis  ?--s/p IV Lasix  ?--completed 10 days of abx for PNA ?Plan: ?--no further standing diuretic, per pulm  ?--Continue  supplemental O2 to keep sats between 88-92% ? ?Insomnia ?--cont seroquel to 75 mg nightly ? ?Hypotension ?BP has been low normal.  Was started on midodrine 10 mg TID. ?--hold home Cardizem and Lisinopril ?--cont midodrine to 5 mg TID ? ?Pleural effusion on right ?- With partial loculation ?--s/p right thora with 1.2L removal ?--outpatient f/u with Dr. Raul Del about final pleural fluid path, to rule out mesothelioma.   ? ?Sepsis due to pneumonia [facility]) ?- Patient meeting severe sepsis criteria with increased heart rate, increased respiration rate, hypoxia requiring BiPAP on initial presentation to the ED, elevated white cell count, source of loculated pleural effusion versus pneumonia ?- abx as above ? ?Depression ?- cont home duloxetine 30 mg daily ? ?Left lower lobe pulmonary nodule ?- On CTA of chest on 08/26/2021, described as spiculated, 12 x 6 mm ?- Discussed extensively with patient at bedside that he will need outpatient follow-up.  Pt plans to establish with Dr. Raul Del ? ?Chronic hypoxemic respiratory failure (HCC) ?- On baseline 4 to 5 L nasal cannula ? ?CAP (community acquired pneumonia) ?--MRSA screen pos.  ?--completed 10 day abx with Levaquin and vanc/Linezlid, per pulm rec ? ?Protein-calorie malnutrition, severe ?- Dietary consulted, supplements per dietician  ? ?Unintentional weight loss ?- Concerning for carcinoma ? ?GERD (gastroesophageal reflux disease) ?- cont home PPI ? ?HTN (hypertension) ?-BP currently low normal ?--hold home cardizem, Lisinopril ? ?Hyponatremia ?- Appears baseline at this time ? ?COPD exacerbation ([place]) ?-Pt has baseline severe COPD  ?--pulm consulted, Dr. Mortimer Fries ?--IV solumedrol transitioned to prednisone 40 mg daily ?Plan: ?--taper prednisone to 20 mg daily ?--cont Xopenex neb  and pulmicort neb ?--cont Spiriva ?--cont Mucomyst neb BID for mucus clearance ?--encourage flutter valve use ? ?Tobacco dependency ?- Nicotine patch as needed ordered ? ? ? ? ?  ? ?Subjective:  ?Pt  reported having sputum that he couldn't cough up.  Encouraged pt to use flutter valve. ? ? ?Physical Exam: ? ?Constitutional: NAD, AAOx3 ?HEENT: conjunctivae and lids normal, EOMI ?CV: No cyanosis.   ?RESP: shallow breaths, on 4L ?Extremities: No effusions, edema in BLE ?SKIN: warm, dry ?Neuro: II - XII grossly intact.   ?Psych: depressed mood and affect.  Appropriate judgement and reason ? ? ?Data Reviewed: ? ?Family Communication:  ? ?Disposition: ?Status is: Inpatient ? ? Planned Discharge Destination: Rehab  ? ? ? ? ?Time spent: 25 minutes ? ?Author: ?Enzo Bi, MD ?09/06/2021 7:27 PM ? ?For on call review [email or web address].  ?

## 2021-09-06 NOTE — Care Management Important Message (Signed)
Important Message ? ?Patient Details  ?Name: Jeremiah [name] ?MRN: [id number] ?Date of Birth: 22-Apr-1951 ? ? ?Medicare Important Message Given:  Yes ? ? ? ? ?Juliann Pulse A Blaire Hodsdon ?09/06/2021, 11:40 AM ?

## 2021-09-06 NOTE — TOC Progression Note (Signed)
Transition of Care (TOC) - Progression Note  ? ? ?Patient Details  ?Name: Jeremiah [name] ?MRN: [id number] ?Date of Birth: 05-Feb-1951 ? ?Transition of Care (TOC) CM/SW Contact  ?Avontae Burkhead A Zayin Valadez, LCSW ?Phone Number: ?09/06/2021, 2:52 PM ? ?Clinical Narrative:   CSW called [employer] to check on auth and why its still pending. Per [place] rep they are still reviewing. Pt's friend [name] updated.  ? ? ? ?Expected Discharge Plan: [place] ?Barriers to Discharge: Continued Medical Work up ? ?Expected Discharge Plan and Services ?Expected Discharge Plan: [place] ?  ?Discharge Planning Services: CM Consult ?Post Acute Care Choice: Home Health ?Living arrangements for the past 2 months: Mobile Home ?                ?DME Arranged: N/A ?DME Agency: NA ?  ?  ?  ?HH Arranged: RN, PT, OT ?[place] Agency: [facility] ?Date HH Agency Contacted: 08/29/21 ?Time [place]: 4081 ?Representative spoke with at [place]: Juanda Crumble ? ? ?Social Determinants of Health (SDOH) Interventions ?  ? ?Readmission Risk Interventions ? ?  08/29/2021  ?  2:00 PM 10/21/2020  ?  1:14 PM 09/27/2020  ? 11:18 AM  ?Readmission Risk Prevention Plan  ?Transportation Screening Complete Complete Complete  ?PCP or Specialist Appt within 3-5 Days  Complete Complete  ?[place] or Home Care Consult  Complete Complete  ?Social Work Consult for [place] Planning/Counseling  Complete Not Complete  ?SW consult not completed comments   RNCM assigned to patient  ?Palliative Care Screening  Not Applicable Not Applicable  ?Medication Review Press photographer) Complete Complete Complete  ?PCP or Specialist appointment within 3-5 days of discharge Complete    ?[place] or Home Care Consult Complete    ?SW Recovery Care/Counseling Consult Complete    ?Palliative Care Screening Not Applicable    ?[place] Not Complete    ?SNF Comments waiting on PT recomendation    ? ? ?

## 2021-09-07 DIAGNOSIS — J9621 Acute and chronic respiratory failure with hypoxia: Secondary | ICD-10-CM | POA: Diagnosis not present

## 2021-09-07 LAB — BASIC METABOLIC PANEL
Anion gap: 7 (ref 5–15)
BUN: 33 mg/dL — ABNORMAL HIGH (ref 8–23)
CO2: 38 mmol/L — ABNORMAL HIGH (ref 22–32)
Calcium: 9.4 mg/dL (ref 8.9–10.3)
Chloride: 92 mmol/L — ABNORMAL LOW (ref 98–111)
Creatinine, Ser: 0.56 mg/dL — ABNORMAL LOW (ref 0.61–1.24)
GFR, Estimated: >60 mL/min (ref >60)
Glucose, Bld: 79 mg/dL (ref 70–99)
Potassium: 4.7 mmol/L (ref 3.5–5.1)
Sodium: 137 mmol/L (ref 135–145)

## 2021-09-07 LAB — CBC
HCT: 33.9 % — ABNORMAL LOW (ref 39.0–52.0)
Hemoglobin: 10.8 g/dL — ABNORMAL LOW (ref 13.0–17.0)
MCH: 27.8 pg (ref 26.0–34.0)
MCHC: 31.9 g/dL (ref 30.0–36.0)
MCV: 87.1 fL (ref 80.0–100.0)
Platelets: 237 10*3/uL (ref 150–400)
RBC: 3.89 MIL/uL — ABNORMAL LOW (ref 4.22–5.81)
RDW: 17.2 % — ABNORMAL HIGH (ref 11.5–15.5)
WBC: 23.3 10*3/uL — ABNORMAL HIGH (ref 4.0–10.5)
nRBC: 0 % (ref 0.0–0.2)

## 2021-09-07 LAB — LEGIONELLA SPECIES CULTURE

## 2021-09-07 LAB — MAGNESIUM: Magnesium: 2.1 mg/dL (ref 1.7–2.4)

## 2021-09-07 LAB — LEGIONELLA CULTURE REFLEX

## 2021-09-07 MED ORDER — LEVALBUTEROL HCL 0.63 MG/3ML IN NEBU
0.6300 mg | INHALATION_SOLUTION | Freq: Three times a day (TID) | RESPIRATORY_TRACT | Status: DC
  Administered 2021-09-07 – 2021-09-08 (×3): 0.63 mg via RESPIRATORY_TRACT
  Filled 2021-09-07 (×4): qty 3

## 2021-09-07 NOTE — TOC Progression Note (Signed)
Transition of Care (TOC) - Progression Note  ? ? ?Patient Details  ?Name: Jeremiah [name] ?MRN: [id number] ?Date of Birth: 1951/03/22 ? ?Transition of Care (TOC) CM/SW Contact  ?Benard Minturn A Delani Kohli, LCSW ?Phone Number: ?09/07/2021, 8:59 AM ? ?Clinical Narrative:   CSW received a call from [place] and pt's case was sent to peer to peer. Info provided to MD. ? ? ? ?Expected Discharge Plan: [place] ?Barriers to Discharge: Continued Medical Work up ? ?Expected Discharge Plan and Services ?Expected Discharge Plan: [place] ?  ?Discharge Planning Services: CM Consult ?Post Acute Care Choice: Home Health ?Living arrangements for the past 2 months: Mobile Home ?                ?DME Arranged: N/A ?DME Agency: NA ?  ?  ?  ?HH Arranged: RN, PT, OT ?[place] [facility]: [facility] ?Date HH Agency Contacted: 08/29/21 ?Time [place]: [phone] ?Representative spoke with at [place]: Juanda Crumble ? ? ?Social Determinants of Health (SDOH) Interventions ?  ? ?Readmission Risk Interventions ? ?  08/29/2021  ?  2:00 PM 10/21/2020  ?  1:14 PM 09/27/2020  ? 11:18 AM  ?Readmission Risk Prevention Plan  ?Transportation Screening Complete Complete Complete  ?PCP or Specialist Appt within 3-5 Days  Complete Complete  ?[place] or Home Care Consult  Complete Complete  ?Social Work Consult for [place] Planning/Counseling  Complete Not Complete  ?SW consult not completed comments   RNCM assigned to patient  ?Palliative Care Screening  Not Applicable Not Applicable  ?Medication Review Press photographer) Complete Complete Complete  ?PCP or Specialist appointment within 3-5 days of discharge Complete    ?[place] or Home Care Consult Complete    ?SW Recovery Care/Counseling Consult Complete    ?Palliative Care Screening Not Applicable    ?[place] Not Complete    ?SNF Comments waiting on PT recomendation    ? ? ?

## 2021-09-07 NOTE — Progress Notes (Signed)
[place] at [facility] ? ? ?PATIENT NAME: Jeremiah [name]   ? ?MR#:  [id number] ? ?DATE OF BIRTH:  [birth year] ? ?SUBJECTIVE:  ? ?patient working with physical therapy earlier. Appears quite deconditioned due to his lung issues. Tolerating PO diet. Did work with therapy better than couple days ago. Wears chronically 4 L oxygen. desats on exertion. No family at bedside ? ? ?VITALS:  ?Blood pressure 118/69, pulse (!) 110, temperature 97.9 ?F (36.6 ?C), resp. rate 16, height 6' (1.829 m), weight 63.6 kg, SpO2 93 %. ? ?PHYSICAL EXAMINATION:  ? ?GENERAL:  71 y.o.-year-old patient lying in the bed with mild to moderate respiratory distress. Severely malnourished and deconditioned ?LUNGS: decreased breath sounds bilaterally, no wheezing, rales, rhonchi.  ?CARDIOVASCULAR: S1, S2 normal. No murmurs, rubs, or gallops. Mild tachycardia ?ABDOMEN: Soft, nontender, nondistended. Bowel sounds present.  ?EXTREMITIES: No  edema b/l.    ?NEUROLOGIC: nonfocal  patient is alert and awake ?  ? ?LABORATORY PANEL:  ?CBC ?Recent Labs  ?Lab 09/07/21 ?0350  ?WBC 23.3*  ?HGB 10.8*  ?HCT 33.9*  ?PLT 237  ? ? ?Chemistries  ?Recent Labs  ?Lab 09/07/21 ?0350  ?NA 137  ?K 4.7  ?CL 92*  ?CO2 38*  ?GLUCOSE 79  ?BUN 33*  ?CREATININE 0.56*  ?CALCIUM 9.4  ?MG 2.1  ? ? ? ?Assessment and Plan ? ?Mr. [name] is a 71 year old male with history of tobacco dependence, depression, anxiety, COPD, hypertension, GERD, chronic respiratory failure on 4 to 5 L nasal cannula at home, who presents emergency department for chief concerns of worsening shortness of breath for 2 weeks. ?Initial vitals in the emergency department showed temperature of 97.3, respiration rate of 25, heart rate of 102, blood pressure 119/76, SPO2 of 98% initially on BiPAP. ? ?  ?CTA of the chest for PE with and without contrast was read as: Marked severity emphysematous lung disease with marked severity of the right lower lobe consolidation.  Large partially  loculated right-sided pleural effusion.  New 12 mm x 6 mm spiculated lung nodule versus area of focal scarring within the posterior aspect of the left lower lobe. ? ? Acute on chronic respiratory failure with hypoxia ([place]) ?severe emphysema ?--on home 4-5L O2 at baseline.   ?- Initially on BiPAP, now back on 4L O2 ?- Multifactorial including right-sided loculated pleural effusion, community-acquired pneumonia, severe COPD. ?--s/p right thoracentesis  ?--s/p IV Lasix  ?--completed 10 days of abx for PNA ?--no further standing diuretic, per pulm  ?--Continue supplemental O2 to keep sats between 88-92% ?  ?Insomnia ?--cont seroquel to 75 mg nightly ?  ?Hypotension ?BP has been low normal.  Was started on midodrine 10 mg TID. ?--hold home Cardizem and Lisinopril ?--cont midodrine to 5 mg TID ?  ?Pleural effusion on right ?- With partial loculation ?--s/p right thora with 1.2L removal ?--outpatient f/u with Dr. Raul Del about final pleural fluid path, to rule out mesothelioma.   ?  ?Sepsis due to pneumonia [facility]) ?- Patient meeting severe sepsis criteria with increased heart rate, increased respiration rate, hypoxia requiring BiPAP on initial presentation to the ED, elevated white cell count, source of loculated pleural effusion versus pneumonia ?- abx as above ?-- sepsis resolved ?  ?Depression ?- cont home duloxetine  ?  ?Left lower lobe pulmonary nodule ?- On CTA of chest on 08/26/2021, described as spiculated, 12 x 6 mm ?- Discussed extensively with patient at bedside that he will need outpatient follow-up.  Pt plans  to establish with Dr. Raul Del ?  ?Chronic hypoxemic respiratory failure (HCC) ?- On baseline 4 to 5 L nasal cannula ?  ?CAP (community acquired pneumonia) ?--MRSA screen pos.  ?--completed 10 day abx with Levaquin and vanc/Linezlid, per pulm rec ?  ?Protein-calorie malnutrition, severe ?- Dietary consulted, supplements per dietician  ?  ?Unintentional weight loss ?- Concerning for carcinoma, severe  emphysema ?  ?GERD (gastroesophageal reflux disease) ?- cont home PPI ?  ?HTN (hypertension) ?-BP currently low normal ?--hold home cardizem, Lisinopril ?  ?Tobacco dependency ?- Nicotine patch as needed ordered ?-- patient advised to abstain from smoking ?  ?palliative care consultation done. Patient seems like not much interested brother note. Will have palliative care follow-up as outpatient ? ? ?Procedures:right sided thoracentesis ?Family communication : none ?Consults : pulmonary ?CODE STATUS: full ?DVT Prophylaxis : enoxaparin ?Level of care: Med-Surg ?Status is: Inpatient ?Remains inpatient appropriate because: did peer to peer review today. Approved for rehab. Awaiting rehab bed. ?  ? ?TOTAL TIME TAKING CARE OF THIS PATIENT: 30 minutes.  ?>50% time spent on counselling and coordination of care ? ?Note: This dictation was prepared with Dragon dictation along with smaller phrase technology. Any transcriptional errors that result from this process are unintentional. ? ?Fritzi Mandes M.D  ? ? ?[facility]  ? ?CC: ?Primary care physician; [place]  ?

## 2021-09-07 NOTE — Progress Notes (Signed)
Nutrition Follow-up ? ?DOCUMENTATION CODES:  ? ?Severe malnutrition in context of chronic illness ? ?INTERVENTION:  ? ?-Continue Ensure Enlive po TID, each supplement provides 350 kcal and 20 grams of protein ?-Continue Magic cup TID with meals, each supplement provides 290 kcal and 9 grams of protein  ?-Continue MVI with minerals daily ?-Continue dysphagia 3 diet for ease of intake ? ?NUTRITION DIAGNOSIS:  ? ?Severe Malnutrition related to chronic illness (COPD, CHF) as evidenced by percent weight loss, moderate fat depletion, moderate muscle depletion, severe muscle depletion. ? ?Ongoing ? ?GOAL:  ? ?Patient will meet greater than or equal to 90% of their needs ? ?Progressing  ? ?MONITOR:  ? ?PO intake, Supplement acceptance, Labs, Weight trends, Skin, I & O's ? ?REASON FOR ASSESSMENT:  ? ?Consult ?Assessment of nutrition requirement/status ? ?ASSESSMENT:  ? ?71 y/o male with h/o CHF, COPD, HTN, GERD, depression, pulmonary nodule and malnutrition who is admitted with CAP and sepsis. ? ?4/24- s/p rt thoracentesis (1.2 L removed) ? ?Reviewed I/O's: -300 ml x 24 hours and -8.7 L since admission ? ?UOP: 300 ml x 24 hours ?  ?Pt unavailable at time of visit. Attempted to speak with pt via call to hospital room phone, however, unable to reach. ? ?Pt with improved oral intake. Noted meal completions 95-100%. Pt is drinking Ensure supplements.  ? ?Per MD notes, pt awaiting insurance authorization for SNF placement.  ? ?Medications reviewed and include miralax and prednisone.  ? ?Labs reviewed.  ? ?Diet Order:   ?Diet Order   ? ?       ?  DIET DYS 3 Room service appropriate? Yes; Fluid consistency: Thin  Diet effective now       ?  ? ?  ?  ? ?  ? ? ?EDUCATION NEEDS:  ? ?Education needs have been addressed ? ?Skin:  Skin Assessment: Skin Integrity Issues: ?Skin Integrity Issues:: Other (Comment) ?Other: open wound to rt heel ? ?Last BM:  09/07/21 ? ?Height:  ? ?Ht Readings from Last 1 Encounters:  ?08/26/21 6' (1.829 m)   ? ? ?Weight:  ? ?Wt Readings from Last 1 Encounters:  ?08/27/21 63.6 kg  ? ? ?Ideal Body Weight:  80.9 kg ? ?BMI:  Body mass index is 19.02 kg/m?. ? ?Estimated Nutritional Needs:  ? ?Kcal:  1900-2200kcal/day ? ?Protein:  95-110g/day ? ?Fluid:  1.6-1.9L/day ? ? ? ?Loistine Chance, RD, LDN, CDCES ?Registered Dietitian II ?Certified Diabetes Care and Education Specialist ?Please refer to [facility] for RD and/or RD on-call/weekend/after hours pager  ?

## 2021-09-07 NOTE — Progress Notes (Signed)
Physical Therapy Treatment ?Patient Details ?Name: Jeremiah [name] ?MRN: [id number] ?DOB: Jun 25, 1950 ?Today's Date: 09/07/2021 ? ? ?History of Present Illness Mr. Jacorian Golaszewski is a 71 year old male with history of tobacco dependence, depression, anxiety, COPD, hypertension, GERD, chronic respiratory failure on 4 to 5 L nasal cannula at home, who presents emergency department for chief concerns of worsening shortness of breath for 2 weeks. ? ?  ?PT Comments  ? ? Extended session performed this date with heavy focus on attempting mobility. Pt willing to attempt standing x 1 rep and was unsuccessful and unable to tolerate standing. Pt severely limited by anxiety, O2 sats, and WOB. HR increases to 124bpm with light movement and pt needed extended breaks throughout. Was able to tolerate sitting at EOB for extended time. Educated on continuing mobility efforts outside of therapy sessions. Pt continues to be on less O2 each session, currently on 4L. At rest, O2 sats at 88% and quickly decrease with any exertion. Heavy education on progression of therapy in order to achieve functional goals. Will continue to progress as able.   ?Recommendations for follow up therapy are one component of a multi-disciplinary discharge planning process, led by the attending physician.  Recommendations may be updated based on patient status, additional functional criteria and insurance authorization. ? ?Follow Up Recommendations ? Skilled nursing-short term rehab (<3 hours/day) ?  ?  ?Assistance Recommended at Discharge Frequent or constant Supervision/Assistance  ?Patient can return home with the following Two people to help with walking and/or transfers;A lot of help with bathing/dressing/bathroom;Assistance with cooking/housework;Assist for transportation;Help with stairs or ramp for entrance ?  ?Equipment Recommendations ? Wheelchair (measurements PT);Wheelchair cushion (measurements PT);Hospital bed  ?  ?Recommendations for Other Services    ? ? ?  ?Precautions / Restrictions Precautions ?Precautions: None ?Restrictions ?Weight Bearing Restrictions: No  ?  ? ?Mobility ? Bed Mobility ?Overal bed mobility: Needs Assistance ?Bed Mobility: Supine to Sit ?  ?  ?Supine to sit: Min assist ?Sit to supine: Min assist ?  ?General bed mobility comments: needs assist with B LE management. Once seated at EOB, able to sit with supervision and heavily flexed trunkal posture. Unable to demonstrate upright posture. Increased WOB with sitting at EOB with HR ranging from 110-124bpm with exertion. O2 sats at 75-83% on 4L of O2 with exertion. ?  ? ?Transfers ?Overall transfer level: Needs assistance ?Equipment used: Rolling walker (2 wheels) ?Transfers: Sit to/from Stand ?Sit to Stand: Max assist, From elevated surface ?  ?  ?  ?  ?  ?General transfer comment: bed elevated and takes max encouragement to attempt standing. Pt only agreeable to one attempt. RW used and even with max assist from PT, unable to achieve buttock lift off from bed. Unable to achieve standing position. Pt unwilling to attempt further reps. Needed max assist for lateral transfer towards [facility] for repositioning ?  ? ?Ambulation/Gait ?  ?  ?  ?  ?  ?  ?  ?General Gait Details: unable ? ? ?Stairs ?  ?  ?  ?  ?  ? ? ?Wheelchair Mobility ?  ? ?Modified Rankin (Stroke Patients Only) ?  ? ? ?  ?Balance Overall balance assessment: Needs assistance ?Sitting-balance support: Feet supported, Bilateral upper extremity supported ?Sitting balance-Leahy Scale: Fair ?  ?  ?  ?  ?  ?  ?  ?  ?  ?  ?  ?  ?  ?  ?  ?  ?  ? ?  ?Cognition Arousal/Alertness:  Awake/alert ?Behavior During Therapy: Anxious ?Overall Cognitive Status: Within Functional Limits for tasks assessed ?  ?  ?  ?  ?  ?  ?  ?  ?  ?  ?  ?  ?  ?  ?  ?  ?General Comments: highly anxious and reports PT is pushing too hard ?  ?  ? ?  ?Exercises Other Exercises ?Other Exercises: breathing ther-ex performed including PLB, IS, and flutter valve. ONly able to reach  164mL on IS. 10 reps. O2 sats around 75% on 4L of O2. ? ?  ?General Comments   ?  ?  ? ?Pertinent Vitals/Pain Pain Assessment ?Pain Assessment: No/denies pain  ? ? ?Home Living   ?  ?  ?  ?  ?  ?  ?  ?  ?  ?   ?  ?Prior Function    ?  ?  ?   ? ?PT Goals (current goals can now be found in the care plan section) Acute Rehab PT Goals ?Patient Stated Goal: to go home. ?PT Goal Formulation: With patient ?Time For Goal Achievement: 09/13/21 ?Potential to Achieve Goals: Poor ?Progress towards PT goals: Progressing toward goals ? ?  ?Frequency ? ? ? Min 2X/week ? ? ? ?  ?PT Plan Current plan remains appropriate  ? ? ?Co-evaluation   ?  ?  ?  ?  ? ?  ?AM-PAC PT "6 Clicks" Mobility   ?Outcome Measure ? Help needed turning from your back to your side while in a flat bed without using bedrails?: A Little ?Help needed moving from lying on your back to sitting on the side of a flat bed without using bedrails?: A Little ?Help needed moving to and from a bed to a chair (including a wheelchair)?: Total ?Help needed standing up from a chair using your arms (e.g., wheelchair or bedside chair)?: Total ?Help needed to walk in hospital room?: Total ?Help needed climbing 3-5 steps with a railing? : Total ?6 Click Score: 10 ? ?  ?End of Session Equipment Utilized During Treatment: Oxygen ?Activity Tolerance: Patient limited by fatigue ?Patient left: in bed;with bed alarm set ?Nurse Communication: Mobility status ?PT Visit Diagnosis: Unsteadiness on feet (R26.81);Other abnormalities of gait and mobility (R26.89);Muscle weakness (generalized) (M62.81);Difficulty in walking, not elsewhere classified (R26.2) ?  ? ? ?Time: 9390-3009 ?PT Time Calculation (min) (ACUTE ONLY): 38 min ? ?Charges:  $Therapeutic Exercise: 8-22 mins ?$Therapeutic Activity: 23-37 mins          ?          ? ?Greggory Stallion, PT, DPT, GCS ?[phone] ? ? ? ?Breea Loncar ?09/07/2021, 11:57 AM ? ?

## 2021-09-07 NOTE — Plan of Care (Signed)
?  Problem: Education: ?Goal: Knowledge of General Education information will improve ?Description: Including pain rating scale, medication(s)/side effects and non-pharmacologic comfort measures ?Outcome: Progressing ?  ?Problem: Health Behavior/Discharge Planning: ?Goal: Ability to manage health-related needs will improve ?Outcome: Progressing ?  ?Problem: Clinical Measurements: ?Goal: Ability to maintain clinical measurements within normal limits will improve ?Outcome: Progressing ?Goal: Will remain free from infection ?Outcome: Progressing ?Goal: Diagnostic test results will improve ?Outcome: Progressing ?Goal: Respiratory complications will improve ?Outcome: Progressing ?Goal: Cardiovascular complication will be avoided ?Outcome: Progressing ?  ?Problem: Activity: ?Goal: Risk for activity intolerance will decrease ?Outcome: Progressing ?  ?Problem: Nutrition: ?Goal: Adequate nutrition will be maintained ?Outcome: Progressing ?  ?Problem: Coping: ?Goal: Level of anxiety will decrease ?Outcome: Progressing ?  ?Problem: Elimination: ?Goal: Will not experience complications related to bowel motility ?Outcome: Progressing ?Goal: Will not experience complications related to urinary retention ?Outcome: Progressing ?  ?Problem: Pain Managment: ?Goal: General experience of comfort will improve ?Outcome: Progressing ?  ?Problem: Safety: ?Goal: Ability to remain free from injury will improve ?Outcome: Progressing ?  ?Problem: Skin Integrity: ?Goal: Risk for impaired skin integrity will decrease ?Outcome: Not Progressing ? Patient has skin breakdown on sacrum. ?

## 2021-09-08 DIAGNOSIS — J9621 Acute and chronic respiratory failure with hypoxia: Secondary | ICD-10-CM | POA: Diagnosis not present

## 2021-09-08 LAB — BASIC METABOLIC PANEL
Anion gap: 4 — ABNORMAL LOW (ref 5–15)
BUN: 30 mg/dL — ABNORMAL HIGH (ref 8–23)
CO2: 40 mmol/L — ABNORMAL HIGH (ref 22–32)
Calcium: 8.9 mg/dL (ref 8.9–10.3)
Chloride: 94 mmol/L — ABNORMAL LOW (ref 98–111)
Creatinine, Ser: 0.56 mg/dL — ABNORMAL LOW (ref 0.61–1.24)
GFR, Estimated: >60 mL/min (ref >60)
Glucose, Bld: 89 mg/dL (ref 70–99)
Potassium: 4.4 mmol/L (ref 3.5–5.1)
Sodium: 138 mmol/L (ref 135–145)

## 2021-09-08 LAB — CBC
HCT: 34.6 % — ABNORMAL LOW (ref 39.0–52.0)
Hemoglobin: 10.9 g/dL — ABNORMAL LOW (ref 13.0–17.0)
MCH: 27.5 pg (ref 26.0–34.0)
MCHC: 31.5 g/dL (ref 30.0–36.0)
MCV: 87.4 fL (ref 80.0–100.0)
Platelets: 249 10*3/uL (ref 150–400)
RBC: 3.96 MIL/uL — ABNORMAL LOW (ref 4.22–5.81)
RDW: 17 % — ABNORMAL HIGH (ref 11.5–15.5)
WBC: 20.6 10*3/uL — ABNORMAL HIGH (ref 4.0–10.5)
nRBC: 0 % (ref 0.0–0.2)

## 2021-09-08 LAB — MAGNESIUM: Magnesium: 2 mg/dL (ref 1.7–2.4)

## 2021-09-08 MED ORDER — ADULT MULTIVITAMIN W/MINERALS CH: 1.0000 | ORAL_TABLET | Freq: Every day | ORAL | 0 refills | Status: AC

## 2021-09-08 MED ORDER — TAMSULOSIN HCL 0.4 MG PO CAPS: 0.4000 mg | ORAL_CAPSULE | Freq: Every day | ORAL | 0 refills | Status: AC

## 2021-09-08 MED ORDER — PREDNISONE 20 MG PO TABS: 20.0000 mg | ORAL_TABLET | Freq: Every day | ORAL | 1 refills | Status: DC

## 2021-09-08 MED ORDER — QUETIAPINE FUMARATE 50 MG PO TABS: 75.0000 mg | ORAL_TABLET | Freq: Every day | ORAL | 0 refills | Status: DC

## 2021-09-08 MED ORDER — LISINOPRIL 5 MG PO TABS: 5.0000 mg | ORAL_TABLET | Freq: Every day | ORAL | 0 refills | Status: DC

## 2021-09-08 NOTE — Progress Notes (Addendum)
1014 ?Report given to [place] at [facility] All questions and concerns answered. Pt going to room 3-31b ? ?1129 ?Transport here to transport pt to [facility] at this time ?

## 2021-09-08 NOTE — Plan of Care (Signed)
  Problem: Clinical Measurements: Goal: Ability to maintain clinical measurements within normal limits will improve Outcome: Progressing Goal: Will remain free from infection Outcome: Progressing Goal: Diagnostic test results will improve Outcome: Progressing Goal: Respiratory complications will improve Outcome: Progressing Goal: Cardiovascular complication will be avoided Outcome: Progressing   Problem: Nutrition: Goal: Adequate nutrition will be maintained Outcome: Progressing   Problem: Coping: Goal: Level of anxiety will decrease Outcome: Progressing   Problem: Elimination: Goal: Will not experience complications related to bowel motility Outcome: Progressing Goal: Will not experience complications related to urinary retention Outcome: Progressing   Problem: Pain Managment: Goal: General experience of comfort will improve Outcome: Progressing   Problem: Safety: Goal: Ability to remain free from injury will improve Outcome: Progressing   Problem: Skin Integrity: Goal: Risk for impaired skin integrity will decrease Outcome: Progressing   Problem: Education: Goal: Knowledge of General Education information will improve Description: Including pain rating scale, medication(s)/side effects and non-pharmacologic comfort measures Outcome: Not Progressing   Problem: Health Behavior/Discharge Planning: Goal: Ability to manage health-related needs will improve Outcome: Not Progressing   Problem: Activity: Goal: Risk for activity intolerance will decrease Outcome: Not Progressing   

## 2021-09-08 NOTE — Discharge Summary (Signed)
?Physician Discharge Summary ?  ?Patient: Jeremiah [name] MRN: [id number] DOB: [birth year]  ?Admit date:     08/26/2021  ?Discharge date: 09/08/21  ?Discharge Physician: Fritzi Mandes  ? ?PCP: [place]  ? ?Recommendations at discharge:  ? ? ? Use oxygen 4 to 5 L per minute to keep sats greater than 88-90% ?use your nebulizer's as needed ?smoking cessation advised ? ?Discharge Diagnoses: ? ?acute on chronic respiratory failure with hypoxia/hypercarbia ?severe end-stage emphysema ?chronic respiratory failure on chronic oxygen ?sepsis due to pneumonia ?right pleural effusion status post thoracentesis ? ?Hospital Course: ? ?[name] is a 71 year old male with history of tobacco dependence, depression, anxiety, COPD, hypertension, GERD, chronic respiratory failure on 4 to 5 L nasal cannula at home, who presents emergency department for chief concerns of worsening shortness of breath for 2 weeks. ?Initial vitals in the emergency department showed temperature of 97.3, respiration rate of 25, heart rate of 102, blood pressure 119/76, SPO2 of 98% initially on BiPAP. ? ?  ?CTA of the chest for PE with and without contrast was read as: Marked severity emphysematous lung disease with marked severity of the right lower lobe consolidation.  Large partially loculated right-sided pleural effusion.  New 12 mm x 6 mm spiculated lung nodule versus area of focal scarring within the posterior aspect of the left lower lobe. ?  ? Acute on chronic respiratory failure with hypoxia ([place]) ?severe emphysema ?--on home 4-5L O2 at baseline.   ?- Initially on BiPAP, now back on 4L O2 ?- Multifactorial including right-sided loculated pleural effusion, community-acquired pneumonia, severe COPD. ?--s/p right thoracentesis  ?--s/p IV Lasix  ?--completed 10 days of abx for PNA ?--no further standing diuretic, per pulm  ?--Continue supplemental O2 to keep sats between 88-92% ?  ?Insomnia ?--cont seroquel to 75 mg nightly ?   ?Pleural effusion on right ?- With partial loculation ?--s/p right thoracentesis with 1.2L removal ?--outpatient f/u with Dr. Raul Del about final pleural fluid path, to rule out mesothelioma.   ? -- PLEURAL FLUID, RIGHT; CYTOLOGY:  ?-OCCASIONAL MARKEDLY ATYPICAL SINGLE CELLS PRESENT ?-- fluids send out to [facility] for further evaluation. ? ?Sepsis due to pneumonia [facility]) ?- Patient meeting severe sepsis criteria with increased heart rate, increased respiration rate, hypoxia requiring BiPAP on initial presentation to the ED, elevated white cell count, source of loculated pleural effusion versus pneumonia ?- abx as above ?-- sepsis resolved ?  ?Depression ?- cont home duloxetine  ?  ?Left lower lobe pulmonary nodule ?- On CTA of chest on 08/26/2021, described as spiculated, 12 x 6 mm ?- Discussed extensively with patient at bedside that he will need outpatient follow-up.  Pt plans to establish with Dr. Raul Del ?  ?Chronic hypoxemic respiratory failure (HCC) ?- On baseline 4 to 5 L nasal cannula ?  ?CAP (community acquired pneumonia) ?--MRSA screen pos.  ?--completed 10 day abx with Levaquin and vanc/Linezlid, per pulm rec ?  ?Protein-calorie malnutrition, severe ?- Dietary consulted, supplements per dietician  ?  ?Unintentional weight loss ?- ??carcinoma, severe emphysema ?  ?GERD (gastroesophageal reflux disease) ?- cont home PPI ?  ?HTN (hypertension) ?-BP currently low normal ?--hold home cardizem ?-- resume lisinopril at 5 mg  ?-- patient initially was placed few days on midodrine ?  ?Tobacco dependency ?- Nicotine patch as needed ordered ?-- patient advised to abstain from smoking ?  ?palliative care consultation done. Patient seems like not much interested per note. Will have palliative care follow-up as outpatient. ?Patient understands  he has poor prognosis. Palliative outpatient follow-up will be beneficial ?  ?  ?Procedures:right sided thoracentesis ?Family communication : spoke with Randalyn Rhea boyfriend of  [name] and they are aware patient is discharging to rehab today. ?Consults : pulmonary ?CODE STATUS: full ?DVT Prophylaxis : enoxaparin ?Level of care: Med-Surg ?Status is: Inpatient ?Remains inpatient appropriate because: d ? ?discharged to rehab ?  ? ? ?Disposition: Rehabilitation facility ?Diet recommendation:  ?Discharge Diet Orders (From admission, onward)  ? ?  Start     Ordered  ? 09/08/21 0000  Diet - low sodium heart healthy       ? 09/08/21 0815  ? ?  ?  ? ?  ? ?Cardiac diet ?DISCHARGE MEDICATION: ?Allergies as of 09/08/2021   ? ?   Reactions  ? Penicillins Anaphylaxis, Swelling, Other (See Comments)  ? Tolerated Ceftriaxone 06/2020 ?05/2020 childhood reaction and his mother told him that he "swelled up." ?Had a PCN reaction causing immediate rash, facial/tongue/throat swelling, SOB or lightheadedness with hypotension: Yes ?Had a PCN reaction causing severe rash involving mucus membranes or skin necrosis: No ?Had a PCN reaction that required hospitalization No ?Had a PCN reaction occurring within the last 10 years: No ?If all the above answers are "NO", may proceed with Cephalosporin use.  ? ?  ? ?  ?Medication List  ?  ? ?STOP taking these medications   ? ?diltiazem 120 MG tablet ?Commonly known as: Cardizem ?  ?nicotine polacrilex 2 MG gum ?Commonly known as: NICORETTE ?  ?sodium chloride 1 g tablet ?  ? ?  ? ?TAKE these medications   ? ?acetaminophen 325 MG tablet ?Commonly known as: TYLENOL ?Take 2 tablets (650 mg total) by mouth every 6 (six) hours as needed for mild pain (or Fever >/= 101). ?  ?albuterol 108 (90 Base) MCG/ACT inhaler ?Commonly known as: VENTOLIN HFA ?Inhale into the lungs every 6 (six) hours as needed for wheezing or shortness of breath. ?  ?atorvastatin 40 MG tablet ?Commonly known as: LIPITOR ?Take 40 mg by mouth daily. ?What changed: Another medication with the same name was removed. Continue taking this medication, and follow the directions you see here. ?  ?Breo Ellipta  100-25 MCG/ACT Aepb ?Generic drug: fluticasone furoate-vilanterol ?Inhale 1 puff into the lungs daily. ?  ?DULoxetine 30 MG capsule ?Commonly known as: CYMBALTA ?Take 30 mg by mouth daily. ?  ?ferrous sulfate 325 (65 FE) MG tablet ?Take 1 tablet (325 mg total) by mouth daily. ?  ?furosemide 20 MG tablet ?Commonly known as: Lasix ?Take 0.5 tablets (10 mg total) by mouth daily as needed for edema. ?  ?guaiFENesin-dextromethorphan 100-10 MG/5ML syrup ?Commonly known as: ROBITUSSIN DM ?Take 5 mLs by mouth every 6 (six) hours as needed for cough. ?  ?ipratropium-albuterol 0.5-2.5 (3) MG/3ML Soln ?Commonly known as: DUONEB ?Take 3 mLs by nebulization every 6 (six) hours as needed. ?  ?lisinopril 5 MG tablet ?Commonly known as: ZESTRIL ?Take 1 tablet (5 mg total) by mouth daily. ?What changed:  ?medication strength ?how much to take ?  ?montelukast 10 MG tablet ?Commonly known as: SINGULAIR ?Take 1 tablet (10 mg total) by mouth at bedtime. ?  ?multivitamin with minerals Tabs tablet ?Take 1 tablet by mouth daily. ?  ?mupirocin ointment 2 % ?Commonly known as: BACTROBAN ?Apply 1 application topically 2 (two) times daily. ?  ?nicotine 21 mg/24hr patch ?Commonly known as: NICODERM CQ - dosed in mg/24 hours ?Place 1 patch (21 mg total) onto the skin  daily. ?  ?pantoprazole 20 MG tablet ?Commonly known as: PROTONIX ?Take 1 tablet (20 mg total) by mouth daily. ?  ?predniSONE 20 MG tablet ?Commonly known as: DELTASONE ?Take 1 tablet (20 mg total) by mouth daily with breakfast. ?  ?tamsulosin 0.4 MG Caps capsule ?Commonly known as: FLOMAX ?Take 1 capsule (0.4 mg total) by mouth daily. ?  ?tiotropium 18 MCG inhalation capsule ?Commonly known as: SPIRIVA ?Place 18 mcg into inhaler and inhale daily. ?  ? ?  ? ?  ?  ? ? ?  ?Discharge Care Instructions  ?(From admission, onward)  ?  ? ? ?  ? ?  Start     Ordered  ? 09/08/21 0000  Discharge wound care:       ?Comments: Foam padding on decubitus  ? 09/08/21 0815  ? ?  ?  ? ?  ? ? Contact  information for follow-up providers   ? ? [place] Schedule an appointment as soon as possible for a visit in 1 week(s).   ?Why: Hospital follow-up ?Contact information: ?[place] ?[address]

## 2021-09-08 NOTE — TOC Transition Note (Signed)
Transition of Care (TOC) - CM/SW Discharge Note ? ? ?Patient Details  ?Name: Jeremiah [name] ?MRN: [id number] ?Date of Birth: 1950-11-30 ? ?Transition of Care (TOC) CM/SW Contact:  ?Authur Cubit A Kalasia Crafton, LCSW ?Phone Number: ?09/08/2021, 9:09 AM ? ? ?Clinical Narrative:   Clinical Social Worker facilitated patient discharge including contacting patient family and facility to confirm patient discharge plans.  Clinical information faxed to facility and family agreeable with plan.  CSW arranged ambulance transport via [facility] to [facility] .  RN to call [phone] report prior to discharge. ? ? ? ? ?Final next level of care: [place] ?Barriers to Discharge: No Barriers Identified ? ? ?Patient Goals and CMS Choice ?Patient states their goals for this hospitalization and ongoing recovery are:: to feel better and get home ?CMS Medicare.gov Compare Post Acute Care list provided to:: Patient ?Choice offered to / list presented to : Patient ? ?Discharge Placement ?  ?           ?Patient chooses bed at:  [facility]) ?Patient to be transferred to facility by: [facility] ?Name of family member notified: [name] ?Patient and family notified of of transfer: 09/08/21 ? ?Discharge Plan and Services ?  ?Discharge Planning Services: CM Consult ?Post Acute Care Choice: Home Health          ?DME Arranged: N/A ?DME Agency: NA ?  ?  ?  ?HH Arranged: RN, PT, OT ?[place] Agency: [facility] ?Date HH Agency Contacted: 08/29/21 ?Time [place]: 8466 ?Representative spoke with at [place]: Juanda Crumble ? ?Social Determinants of Health (SDOH) Interventions ?  ? ? ?Readmission Risk Interventions ? ?  08/29/2021  ?  2:00 PM 10/21/2020  ?  1:14 PM 09/27/2020  ? 11:18 AM  ?Readmission Risk Prevention Plan  ?Transportation Screening Complete Complete Complete  ?PCP or Specialist Appt within 3-5 Days  Complete Complete  ?[place] or Home Care Consult  Complete Complete  ?Social Work Consult for [place] Planning/Counseling   Complete Not Complete  ?SW consult not completed comments   RNCM assigned to patient  ?Palliative Care Screening  Not Applicable Not Applicable  ?Medication Review Press photographer) Complete Complete Complete  ?PCP or Specialist appointment within 3-5 days of discharge Complete    ?[place] or Home Care Consult Complete    ?SW Recovery Care/Counseling Consult Complete    ?Palliative Care Screening Not Applicable    ?[place] Not Complete    ?SNF Comments waiting on PT recomendation    ? ? ? ? ? ?

## 2021-09-08 NOTE — Discharge Instructions (Signed)
Use oxygen 4 to 5 L per minute to keep sats greater than 88-90% ?use your nebulizer's as needed ?smoking cessation advised ?

## 2021-09-12 ENCOUNTER — Inpatient Hospital Stay: Payer: Medicare HMO | Admitting: Radiology

## 2021-09-12 ENCOUNTER — Other Ambulatory Visit: Payer: Self-pay

## 2021-09-12 ENCOUNTER — Inpatient Hospital Stay: Payer: Medicare HMO

## 2021-09-12 ENCOUNTER — Emergency Department: Payer: Medicare HMO

## 2021-09-12 ENCOUNTER — Inpatient Hospital Stay
Admission: EM | Admit: 2021-09-12 | Discharge: 2021-09-21 | DRG: 189 | Disposition: A | Discharge status: Hospice | Payer: Medicare HMO | Attending: Internal Medicine | Admitting: Internal Medicine

## 2021-09-12 DIAGNOSIS — R64 Cachexia: Secondary | ICD-10-CM | POA: Diagnosis present

## 2021-09-12 DIAGNOSIS — G8929 Other chronic pain: Secondary | ICD-10-CM | POA: Diagnosis present

## 2021-09-12 DIAGNOSIS — E222 Syndrome of inappropriate secretion of antidiuretic hormone: Secondary | ICD-10-CM | POA: Diagnosis present

## 2021-09-12 DIAGNOSIS — C3491 Malignant neoplasm of unspecified part of right bronchus or lung: Secondary | ICD-10-CM | POA: Diagnosis present

## 2021-09-12 DIAGNOSIS — Z6825 Body mass index (BMI) 25.0-25.9, adult: Secondary | ICD-10-CM

## 2021-09-12 DIAGNOSIS — Z7951 Long term (current) use of inhaled steroids: Secondary | ICD-10-CM

## 2021-09-12 DIAGNOSIS — Z9981 Dependence on supplemental oxygen: Secondary | ICD-10-CM

## 2021-09-12 DIAGNOSIS — J9620 Acute and chronic respiratory failure, unspecified whether with hypoxia or hypercapnia: Secondary | ICD-10-CM | POA: Diagnosis not present

## 2021-09-12 DIAGNOSIS — Z7189 Other specified counseling: Secondary | ICD-10-CM | POA: Diagnosis not present

## 2021-09-12 DIAGNOSIS — J90 Pleural effusion, not elsewhere classified: Secondary | ICD-10-CM | POA: Diagnosis not present

## 2021-09-12 DIAGNOSIS — J189 Pneumonia, unspecified organism: Secondary | ICD-10-CM | POA: Diagnosis present

## 2021-09-12 DIAGNOSIS — J9601 Acute respiratory failure with hypoxia: Secondary | ICD-10-CM | POA: Diagnosis not present

## 2021-09-12 DIAGNOSIS — F17210 Nicotine dependence, cigarettes, uncomplicated: Secondary | ICD-10-CM | POA: Diagnosis present

## 2021-09-12 DIAGNOSIS — J9621 Acute and chronic respiratory failure with hypoxia: Secondary | ICD-10-CM | POA: Diagnosis present

## 2021-09-12 DIAGNOSIS — L89159 Pressure ulcer of sacral region, unspecified stage: Secondary | ICD-10-CM | POA: Diagnosis present

## 2021-09-12 DIAGNOSIS — Z79899 Other long term (current) drug therapy: Secondary | ICD-10-CM

## 2021-09-12 DIAGNOSIS — J9602 Acute respiratory failure with hypercapnia: Secondary | ICD-10-CM

## 2021-09-12 DIAGNOSIS — J939 Pneumothorax, unspecified: Secondary | ICD-10-CM | POA: Diagnosis present

## 2021-09-12 DIAGNOSIS — Z800 Family history of malignant neoplasm of digestive organs: Secondary | ICD-10-CM

## 2021-09-12 DIAGNOSIS — K219 Gastro-esophageal reflux disease without esophagitis: Secondary | ICD-10-CM | POA: Diagnosis present

## 2021-09-12 DIAGNOSIS — Z7952 Long term (current) use of systemic steroids: Secondary | ICD-10-CM

## 2021-09-12 DIAGNOSIS — J432 Centrilobular emphysema: Secondary | ICD-10-CM | POA: Diagnosis present

## 2021-09-12 DIAGNOSIS — I248 Other forms of acute ischemic heart disease: Secondary | ICD-10-CM | POA: Diagnosis present

## 2021-09-12 DIAGNOSIS — J948 Other specified pleural conditions: Secondary | ICD-10-CM | POA: Diagnosis present

## 2021-09-12 DIAGNOSIS — Z880 Allergy status to penicillin: Secondary | ICD-10-CM

## 2021-09-12 DIAGNOSIS — G9341 Metabolic encephalopathy: Secondary | ICD-10-CM | POA: Diagnosis present

## 2021-09-12 DIAGNOSIS — I110 Hypertensive heart disease with heart failure: Secondary | ICD-10-CM | POA: Diagnosis present

## 2021-09-12 DIAGNOSIS — Z638 Other specified problems related to primary support group: Secondary | ICD-10-CM

## 2021-09-12 DIAGNOSIS — R0603 Acute respiratory distress: Secondary | ICD-10-CM | POA: Diagnosis not present

## 2021-09-12 DIAGNOSIS — J9622 Acute and chronic respiratory failure with hypercapnia: Secondary | ICD-10-CM

## 2021-09-12 DIAGNOSIS — Z66 Do not resuscitate: Secondary | ICD-10-CM | POA: Diagnosis present

## 2021-09-12 DIAGNOSIS — Z20822 Contact with and (suspected) exposure to covid-19: Secondary | ICD-10-CM | POA: Diagnosis present

## 2021-09-12 DIAGNOSIS — Z8249 Family history of ischemic heart disease and other diseases of the circulatory system: Secondary | ICD-10-CM

## 2021-09-12 DIAGNOSIS — D630 Anemia in neoplastic disease: Secondary | ICD-10-CM | POA: Diagnosis present

## 2021-09-12 DIAGNOSIS — Z515 Encounter for palliative care: Secondary | ICD-10-CM

## 2021-09-12 DIAGNOSIS — R54 Age-related physical debility: Secondary | ICD-10-CM | POA: Diagnosis present

## 2021-09-12 DIAGNOSIS — I5032 Chronic diastolic (congestive) heart failure: Secondary | ICD-10-CM | POA: Diagnosis present

## 2021-09-12 DIAGNOSIS — J910 Malignant pleural effusion: Secondary | ICD-10-CM | POA: Diagnosis present

## 2021-09-12 DIAGNOSIS — Z9889 Other specified postprocedural states: Secondary | ICD-10-CM

## 2021-09-12 DIAGNOSIS — E43 Unspecified severe protein-calorie malnutrition: Secondary | ICD-10-CM | POA: Diagnosis present

## 2021-09-12 DIAGNOSIS — Z85118 Personal history of other malignant neoplasm of bronchus and lung: Secondary | ICD-10-CM

## 2021-09-12 LAB — CBC WITH DIFFERENTIAL/PLATELET
Abs Immature Granulocytes: 0.15 10*3/uL — ABNORMAL HIGH (ref 0.00–0.07)
Basophils Absolute: 0 10*3/uL (ref 0.0–0.1)
Basophils Relative: 0 %
Eosinophils Absolute: 0 10*3/uL (ref 0.0–0.5)
Eosinophils Relative: 0 %
HCT: 35.5 % — ABNORMAL LOW (ref 39.0–52.0)
Hemoglobin: 11.2 g/dL — ABNORMAL LOW (ref 13.0–17.0)
Immature Granulocytes: 1 %
Lymphocytes Relative: 3 %
Lymphs Abs: 0.8 10*3/uL (ref 0.7–4.0)
MCH: 27.6 pg (ref 26.0–34.0)
MCHC: 31.5 g/dL (ref 30.0–36.0)
MCV: 87.4 fL (ref 80.0–100.0)
Monocytes Absolute: 1 10*3/uL (ref 0.1–1.0)
Monocytes Relative: 4 %
Neutro Abs: 23.4 10*3/uL — ABNORMAL HIGH (ref 1.7–7.7)
Neutrophils Relative %: 92 %
Platelets: 293 10*3/uL (ref 150–400)
RBC: 4.06 MIL/uL — ABNORMAL LOW (ref 4.22–5.81)
RDW: 16.3 % — ABNORMAL HIGH (ref 11.5–15.5)
Smear Review: NORMAL
WBC: 25.5 10*3/uL — ABNORMAL HIGH (ref 4.0–10.5)
nRBC: 0 % (ref 0.0–0.2)

## 2021-09-12 LAB — BLOOD GAS, VENOUS
Acid-Base Excess: 6.1 mmol/L — ABNORMAL HIGH (ref 0.0–2.0)
Bicarbonate: 36.9 mmol/L — ABNORMAL HIGH (ref 20.0–28.0)
O2 Saturation: 84.4 %
Patient temperature: 37
pCO2, Ven: 88 mmHg (ref 44–60)
pH, Ven: 7.23 — ABNORMAL LOW (ref 7.25–7.43)
pO2, Ven: 56 mmHg — ABNORMAL HIGH (ref 32–45)

## 2021-09-12 LAB — BLOOD GAS, ARTERIAL
Acid-Base Excess: 4.5 mmol/L — ABNORMAL HIGH (ref 0.0–2.0)
Bicarbonate: 32.5 mmol/L — ABNORMAL HIGH (ref 20.0–28.0)
Delivery systems: POSITIVE
Expiratory PAP: 6 cmH2O
FIO2: 36 %
Inspiratory PAP: 15 cmH2O
O2 Saturation: 91.8 %
Patient temperature: 37
pCO2 arterial: 63 mmHg — ABNORMAL HIGH (ref 32–48)
pH, Arterial: 7.32 — ABNORMAL LOW (ref 7.35–7.45)
pO2, Arterial: 63 mmHg — ABNORMAL LOW (ref 83–108)

## 2021-09-12 LAB — LACTIC ACID, PLASMA
Lactic Acid, Venous: 1.4 mmol/L (ref 0.5–1.9)
Lactic Acid, Venous: 2.2 mmol/L (ref 0.5–1.9)

## 2021-09-12 LAB — PHOSPHORUS: Phosphorus: 4.1 mg/dL (ref 2.5–4.6)

## 2021-09-12 LAB — BODY FLUID CELL COUNT WITH DIFFERENTIAL
Eos, Fluid: 0 %
Lymphs, Fluid: 23 %
Monocyte-Macrophage-Serous Fluid: 65 %
Neutrophil Count, Fluid: 12 %
Total Nucleated Cell Count, Fluid: 1687 cu mm

## 2021-09-12 LAB — GLUCOSE, CAPILLARY: Glucose-Capillary: 155 mg/dL — ABNORMAL HIGH (ref 70–99)

## 2021-09-12 LAB — COMPREHENSIVE METABOLIC PANEL
ALT: 30 U/L (ref 0–44)
AST: 28 U/L (ref 15–41)
Albumin: 3.1 g/dL — ABNORMAL LOW (ref 3.5–5.0)
Alkaline Phosphatase: 102 U/L (ref 38–126)
Anion gap: 10 (ref 5–15)
BUN: 30 mg/dL — ABNORMAL HIGH (ref 8–23)
CO2: 33 mmol/L — ABNORMAL HIGH (ref 22–32)
Calcium: 8.7 mg/dL — ABNORMAL LOW (ref 8.9–10.3)
Chloride: 89 mmol/L — ABNORMAL LOW (ref 98–111)
Creatinine, Ser: 0.63 mg/dL (ref 0.61–1.24)
GFR, Estimated: >60 mL/min (ref >60)
Glucose, Bld: 143 mg/dL — ABNORMAL HIGH (ref 70–99)
Potassium: 4.1 mmol/L (ref 3.5–5.1)
Sodium: 132 mmol/L — ABNORMAL LOW (ref 135–145)
Total Bilirubin: 0.9 mg/dL (ref 0.3–1.2)
Total Protein: 6.3 g/dL — ABNORMAL LOW (ref 6.5–8.1)

## 2021-09-12 LAB — MRSA NEXT GEN BY PCR, NASAL: MRSA by PCR Next Gen: DETECTED — AB

## 2021-09-12 LAB — PROTIME-INR
INR: 1 (ref 0.8–1.2)
Prothrombin Time: 13.2 seconds (ref 11.4–15.2)

## 2021-09-12 LAB — PROCALCITONIN: Procalcitonin: 0.11 ng/mL

## 2021-09-12 LAB — TROPONIN I (HIGH SENSITIVITY)
Troponin I (High Sensitivity): 40 ng/L — ABNORMAL HIGH (ref <18)
Troponin I (High Sensitivity): 51 ng/L — ABNORMAL HIGH (ref <18)

## 2021-09-12 LAB — MAGNESIUM: Magnesium: 1.9 mg/dL (ref 1.7–2.4)

## 2021-09-12 LAB — APTT: aPTT: 27 seconds (ref 24–36)

## 2021-09-12 LAB — BRAIN NATRIURETIC PEPTIDE: B Natriuretic Peptide: 97.8 pg/mL (ref 0.0–100.0)

## 2021-09-12 MED ORDER — NOREPINEPHRINE 4 MG/250ML-% IV SOLN
0.0000 ug/min | INTRAVENOUS | Status: DC
  Administered 2021-09-12: 2 ug/min via INTRAVENOUS
  Administered 2021-09-12 (×2): 3 ug/min via INTRAVENOUS
  Filled 2021-09-12 (×2): qty 250

## 2021-09-12 MED ORDER — SODIUM CHLORIDE 0.9 % IV SOLN
500.0000 mg | INTRAVENOUS | Status: DC
  Administered 2021-09-12 – 2021-09-13 (×2): 500 mg via INTRAVENOUS
  Filled 2021-09-12 (×2): qty 5
  Filled 2021-09-12: qty 500

## 2021-09-12 MED ORDER — SODIUM CHLORIDE 0.9 % IV SOLN: INTRAVENOUS | Status: DC | PRN

## 2021-09-12 MED ORDER — IPRATROPIUM-ALBUTEROL 0.5-2.5 (3) MG/3ML IN SOLN
3.0000 mL | Freq: Once | RESPIRATORY_TRACT | Status: AC
  Administered 2021-09-12: 3 mL via RESPIRATORY_TRACT

## 2021-09-12 MED ORDER — CHLORHEXIDINE GLUCONATE CLOTH 2 % EX PADS
6.0000 | MEDICATED_PAD | Freq: Every day | CUTANEOUS | Status: DC
  Administered 2021-09-12 – 2021-09-18 (×5): 6 via TOPICAL

## 2021-09-12 MED ORDER — ENOXAPARIN SODIUM 40 MG/0.4ML IJ SOSY
40.0000 mg | PREFILLED_SYRINGE | INTRAMUSCULAR | Status: DC
  Administered 2021-09-12 – 2021-09-20 (×8): 40 mg via SUBCUTANEOUS
  Filled 2021-09-12 (×9): qty 0.4

## 2021-09-12 MED ORDER — LEVOFLOXACIN IN D5W 750 MG/150ML IV SOLN
750.0000 mg | Freq: Once | INTRAVENOUS | Status: DC
  Filled 2021-09-12: qty 150

## 2021-09-12 MED ORDER — LACTATED RINGERS IV BOLUS (SEPSIS): 1.0000 mL | Freq: Once | INTRAVENOUS | Status: DC

## 2021-09-12 MED ORDER — LACTATED RINGERS IV BOLUS
1000.0000 mL | Freq: Once | INTRAVENOUS | Status: AC
Start: 2021-09-12 — End: 2021-09-12
  Administered 2021-09-12: 1000 mL via INTRAVENOUS

## 2021-09-12 MED ORDER — METHYLPREDNISOLONE SODIUM SUCC 125 MG IJ SOLR
125.0000 mg | Freq: Once | INTRAMUSCULAR | Status: AC
  Administered 2021-09-12: 125 mg via INTRAVENOUS
  Filled 2021-09-12: qty 2

## 2021-09-12 MED ORDER — PANTOPRAZOLE SODIUM 40 MG IV SOLR
40.0000 mg | Freq: Every day | INTRAVENOUS | Status: DC
Start: 2021-09-12 — End: 2021-09-16
  Administered 2021-09-12 – 2021-09-15 (×4): 40 mg via INTRAVENOUS
  Filled 2021-09-12 (×4): qty 10

## 2021-09-12 MED ORDER — MUPIROCIN 2 % EX OINT
1.0000 "application " | TOPICAL_OINTMENT | Freq: Two times a day (BID) | CUTANEOUS | Status: AC
  Administered 2021-09-12 – 2021-09-17 (×10): 1 via NASAL
  Filled 2021-09-12: qty 22

## 2021-09-12 MED ORDER — LIDOCAINE HCL (CARDIAC) PF 100 MG/5ML IV SOSY
1.0000 mg/kg | PREFILLED_SYRINGE | Freq: Once | INTRAVENOUS | Status: AC
Start: 2021-09-12 — End: 2021-09-12
  Administered 2021-09-12: 68 mg via INTRAVENOUS
  Filled 2021-09-12: qty 5

## 2021-09-12 MED ORDER — LIDOCAINE HCL 1 % IJ SOLN
INTRAMUSCULAR | Status: AC
  Filled 2021-09-12: qty 20

## 2021-09-12 MED ORDER — DEXTROSE 5 % IV SOLN: 250.0000 mg | INTRAVENOUS | Status: DC

## 2021-09-12 MED ORDER — LINEZOLID 600 MG/300ML IV SOLN
600.0000 mg | Freq: Two times a day (BID) | INTRAVENOUS | Status: DC
  Administered 2021-09-12 – 2021-09-15 (×6): 600 mg via INTRAVENOUS
  Filled 2021-09-12 (×6): qty 300

## 2021-09-12 MED ORDER — LACTATED RINGERS IV SOLN: INTRAVENOUS | Status: DC

## 2021-09-12 MED ORDER — CHLORHEXIDINE GLUCONATE 0.12 % MT SOLN
15.0000 mL | Freq: Two times a day (BID) | OROMUCOSAL | Status: DC
  Administered 2021-09-12 – 2021-09-21 (×11): 15 mL via OROMUCOSAL
  Filled 2021-09-12 (×15): qty 15

## 2021-09-12 MED ORDER — ORAL CARE MOUTH RINSE
15.0000 mL | Freq: Two times a day (BID) | OROMUCOSAL | Status: DC
  Administered 2021-09-13 – 2021-09-21 (×11): 15 mL via OROMUCOSAL

## 2021-09-12 MED ORDER — BUDESONIDE 0.5 MG/2ML IN SUSP
0.5000 mg | Freq: Two times a day (BID) | RESPIRATORY_TRACT | Status: DC
  Administered 2021-09-12 – 2021-09-21 (×18): 0.5 mg via RESPIRATORY_TRACT
  Filled 2021-09-12 (×18): qty 2

## 2021-09-12 MED ORDER — SODIUM CHLORIDE 0.9 % IV SOLN
2.0000 g | Freq: Once | INTRAVENOUS | Status: AC
  Administered 2021-09-12: 2 g via INTRAVENOUS
  Filled 2021-09-12: qty 20

## 2021-09-12 MED ORDER — IPRATROPIUM-ALBUTEROL 0.5-2.5 (3) MG/3ML IN SOLN
3.0000 mL | Freq: Four times a day (QID) | RESPIRATORY_TRACT | Status: DC
  Administered 2021-09-12 – 2021-09-21 (×34): 3 mL via RESPIRATORY_TRACT
  Filled 2021-09-12 (×18): qty 3
  Filled 2021-09-12: qty 9
  Filled 2021-09-12 (×17): qty 3

## 2021-09-12 MED ORDER — SODIUM CHLORIDE 0.9 % IV BOLUS
1000.0000 mL | Freq: Once | INTRAVENOUS | Status: AC
  Administered 2021-09-12: 1000 mL via INTRAVENOUS

## 2021-09-12 MED ORDER — POLYETHYLENE GLYCOL 3350 17 G PO PACK: 17.0000 g | PACK | Freq: Every day | ORAL | Status: DC | PRN

## 2021-09-12 MED ORDER — DEXTROSE 5 % IV SOLN
250.0000 mg | INTRAVENOUS | Status: DC
  Filled 2021-09-12 (×2): qty 2.5

## 2021-09-12 MED ORDER — SODIUM CHLORIDE 0.9 % IV SOLN
2.0000 g | Freq: Three times a day (TID) | INTRAVENOUS | Status: DC
  Administered 2021-09-12 – 2021-09-15 (×8): 2 g via INTRAVENOUS
  Filled 2021-09-12: qty 2
  Filled 2021-09-12 (×3): qty 12.5
  Filled 2021-09-12: qty 2
  Filled 2021-09-12 (×6): qty 12.5

## 2021-09-12 MED ORDER — METHYLPREDNISOLONE SODIUM SUCC 40 MG IJ SOLR
40.0000 mg | Freq: Every day | INTRAMUSCULAR | Status: DC
  Administered 2021-09-13 – 2021-09-15 (×3): 40 mg via INTRAVENOUS
  Filled 2021-09-12 (×3): qty 1

## 2021-09-12 MED ORDER — IPRATROPIUM-ALBUTEROL 0.5-2.5 (3) MG/3ML IN SOLN
RESPIRATORY_TRACT | Status: AC
  Administered 2021-09-12: 3 mL
  Filled 2021-09-12: qty 3

## 2021-09-12 MED ORDER — SODIUM CHLORIDE 0.9 % IV SOLN: 500.0000 mg | INTRAVENOUS | Status: DC

## 2021-09-12 MED ORDER — IPRATROPIUM-ALBUTEROL 0.5-2.5 (3) MG/3ML IN SOLN
3.0000 mL | Freq: Once | RESPIRATORY_TRACT | Status: DC
  Filled 2021-09-12: qty 3

## 2021-09-12 MED ORDER — DOCUSATE SODIUM 100 MG PO CAPS: 100.0000 mg | ORAL_CAPSULE | Freq: Two times a day (BID) | ORAL | Status: DC | PRN

## 2021-09-12 NOTE — Progress Notes (Signed)
eLink Physician-Brief Progress Note ?Patient Name: Jermani Eberlein ?DOB: 1951/04/14 ?MRN: [id number] ? ? ?Date of Service ? 09/12/2021  ?HPI/Events of Note ? Lactic acid 2.2  ?eICU Interventions ? AM Lactic acid ordered.  ? ? ? ?  ? ?Kerry Kass Kinzi Frediani ?09/12/2021, 10:15 PM ?

## 2021-09-12 NOTE — ED Notes (Signed)
Having hard time getting pulse ox to regularly read as pt's fingers are cool; R hand more bruised than L.  ?

## 2021-09-12 NOTE — H&P (Signed)
? ?NAME:  Jeremiah [name], MRN:  [id number], DOB:  1951/03/12, LOS: 0 ?ADMISSION DATE:  09/12/2021, CONSULTATION DATE: 09/12/2021 ?REFERRING MD: Dr, Cinda Quest, CHIEF COMPLAINT:  Shortness of Breath   ? ?History of Present Illness:  ?This is a 71 yo male with a PMH of Severe End-Stage Emphysema who presented to [facility] ER on 05/8 from [facility] via EMS with shortness of breath.  Per ER notes EMS reported pt in severe respiratory distress, therefore he was placed on NRB with O2 sats only increasing to 82%.  He also received duoneb, albuterol, and magnesium sulfate. He was recently hospitalized from 04/21-05/4 with acute on chronic hypoxic/hypercapnic respiratory failure secondary to severe end-stage emphysema; sepsis due to pneumonia; and right sided pleural effusion requiring right thoracentesis 08/29/21 (body fluid culture, legionella culture reflex negative). ? ?ED Course ?In the ER pt transitioned to Bipap due to continued respiratory distress.  CXR revealed large right pleural effusion with significant interval increase and CHF.  Lab results: Na+ 132, chloride 89, CO2 33, glucose 143, BUN 30, calcium 8.7, albumin 3.1, troponin 40, wbc 25.5, and hgb 11.2.  ABG: pH 7.23/pCO2 88/pO2 56/acid-base excess 6.1/bicarb 36.9.  Pt also hypotensive and tachycardic, sepsis protocol initiated.  Pt received 2L LR bolus, ceftriaxone, duoneb x2, and 125 mg iv solumedrol.  Due to continued hypotension levophed gtt initiated.  PCCM team contacted for ICU admission.   ? ?Pertinent  Medical History  ?Asthma ?Severe End-stage Emphysema ?O2 Dependent  ?GERD ?HTN ?Multifocal Pneumonia  ?Sepsis ?Tobacco Abuse  ? ?Significant Hospital Events: ?Including procedures, antibiotic start and stop dates in addition to other pertinent events   ?05/8: Pt admitted to ICU with acute on chronic hypoxic hypercapnic respiratory failure secondary to recurrent right-sided pleural effusion and sepsis secondary to possible pneumonia requiring Bipap and  levophed gtt  ?05/8: Right-sided thoracentesis performed per IR with removal of 1.5L of pleural fluid  ? ?Interim History / Subjective:  ?Pt with acute respiratory distress on continuous Bipap 15/5 FiO2 36% with O2 sats 93%.  Currently requiring levophed gtt at 9 mcg/min via peripheral iv  ? ?Objective   ?Blood pressure 110/70, pulse (!) 104, temperature 97.6 ?F (36.4 ?C), temperature source Axillary, resp. rate (!) 26, height 5\' 7"  (1.702 m), weight 68.5 kg, SpO2 97 %. ?   ?FiO2 (%):  [36 %] 36 %  ?No intake or output data in the 24 hours ending 09/12/21 1557 ?Filed Weights  ? 09/12/21 1058 09/12/21 1540  ?Weight: 68 kg 68.5 kg  ? ? ?Examination: ?General: Acute on chronically ill appearing male, in respiratory distress on Bipap  ?HENT: Supple, mild JVD  ?Lungs: Rhonchi throughout, tachypneic with use of accessory muscles  ?Cardiovascular: NSR, rrr, no r/g, 2+ radial/1+ distal pulses; trace bilateral lower extremity edema  ?Abdomen: +BS x4, soft, non tender, non distended  ?Extremities: Normal tone and moves all extremities  ?Neuro: Lethargic, follows commands, PERRLA  ?Skin: Sacral spine pressure ulcer.  Scattered ecchymosis. See Below  ? ? ?GU: Deferred ? ?Resolved Hospital Problem list   ? ? ?Assessment & Plan:  ?Acute on chronic hypoxic hypercapnic respiratory failure secondary to recurrent right-sided pleural effusion and possible pneumonia s/p thoracentesis 05/8 with removal of 1.5L pleural fluid  ?Severe end stage Emphysema  ?Hx: Left lower lobe pulmonary nodule and tobacco abuse  ?- Prn Bipap for dyspnea and/or hypoxia  ?- IV and nebulized steroids ?- Scheduled and prn bronchodilator therapy  ?- Will need smoking cessation counseling once mentation improves  ? ?  Sepsis with septic shock  ?Mildly elevated troponin secondary to demand ischemia in the setting of acute respiratory failure  ?- Continuous telemetry monitoring  ?- Prn levophed gtt to maintain map >65 ?- Hold outpatient  antihypertensives ? ?Leukocytosis secondary to possible pneumonia  ?- Trend WBC and monitor fever curve ?- Trend PCT  ?- Follow culture results  ?- Follow right-sided pleural fluid cytology  ?- Will start cefepime  ? ?Chronic anemia  ?- Trend CBC ?- Transfuse for hgb <7 ?- Monitor s/sx of bleeding  ?- Will resume outpatient ferrous sulfate once able to take po medications  ? ?GERD  ?- Will start PPI  ? ?Severe protein calorie malnutrition  ?- Will consult dietitian once off Bipap ? ?Best Practice (right click and "Reselect all SmartList Selections" daily)  ? ?Diet/type: NPO ?DVT prophylaxis: LMWH ?GI prophylaxis: PPI ?Lines: N/A ?Foley:  N/A ?Code Status:  full code ?Last date of multidisciplinary goals of care discussion [N/A] ? ?Labs   ?CBC: ?Recent Labs  ?Lab 09/06/21 ?0822 09/07/21 ?0350 09/08/21 ?0503 09/12/21 ?1123  ?WBC 21.8* 23.3* 20.6* 25.5*  ?NEUTROABS  --   --   --  23.4*  ?HGB 11.3* 10.8* 10.9* 11.2*  ?HCT 36.3* 33.9* 34.6* 35.5*  ?MCV 87.7 87.1 87.4 87.4  ?PLT 287 237 249 293  ? ? ?Basic Metabolic Panel: ?Recent Labs  ?Lab 09/06/21 ?0822 09/07/21 ?0350 09/08/21 ?0503 09/12/21 ?1123  ?NA 134* 137 138 132*  ?K 4.9 4.7 4.4 4.1  ?CL 89* 92* 94* 89*  ?CO2 41* 38* 40* 33*  ?GLUCOSE 85 79 89 143*  ?BUN 33* 33* 30* 30*  ?CREATININE 0.77 0.56* 0.56* 0.63  ?CALCIUM 9.2 9.4 8.9 8.7*  ?MG 2.1 2.1 2.0  --   ? ?GFR: ?Estimated Creatinine Clearance: 79.2 mL/min (by C-G formula based on SCr of 0.63 mg/dL). ?Recent Labs  ?Lab 09/06/21 ?0822 09/07/21 ?0350 09/08/21 ?0503 09/12/21 ?1123  ?WBC 21.8* 23.3* 20.6* 25.5*  ?LATICACIDVEN  --   --   --  1.4  ? ? ?Liver Function Tests: ?Recent Labs  ?Lab 09/12/21 ?1123  ?AST 28  ?ALT 30  ?ALKPHOS 102  ?BILITOT 0.9  ?PROT 6.3*  ?ALBUMIN 3.1*  ? ?No results for input(s): LIPASE, AMYLASE in the last 168 hours. ?No results for input(s): AMMONIA in the last 168 hours. ? ?ABG ?   ?Component Value Date/Time  ? PHART 7.24 (L) 09/12/2021 1316  ? PCO2ART 76 (HH) 09/12/2021 1316  ? PO2ART 69  (L) 09/12/2021 1316  ? HCO3 32.6 (H) 09/12/2021 1316  ? ACIDBASEDEF 1.2 06/09/2020 2326  ? O2SAT 93.9 09/12/2021 1316  ?  ? ?Coagulation Profile: ?No results for input(s): INR, PROTIME in the last 168 hours. ? ?Cardiac Enzymes: ?No results for input(s): CKTOTAL, CKMB, CKMBINDEX, TROPONINI in the last 168 hours. ? ?HbA1C: ?No results found for: HGBA1C ? ?CBG: ?Recent Labs  ?Lab 09/12/21 ?1543  ?GLUCAP 155*  ? ? ?Review of Systems:   ?Unable to assess pt lethargic on Bipap ? ?Past Medical History:  ?He,  has a past medical history of Acute and chronic respiratory failure (acute-on-chronic) ([place]) (10/18/2020), Asthma, COPD (chronic obstructive pulmonary disease) (HCC), GERD (gastroesophageal reflux disease), HTN (hypertension), Multifocal pneumonia (06/16/2020), Other emphysema ([place]) (08/22/2016), Oxygen dependent, Sepsis ([place]) (05/14/2020), and Tobacco dependence.  ? ?Surgical History:  ? ?Past Surgical History:  ?Procedure Laterality Date  ? CATARACT EXTRACTION W/PHACO Left 12/09/2019  ? Procedure: CATARACT EXTRACTION PHACO AND INTRAOCULAR LENS PLACEMENT (IOC) COMPLICATED LEFT 02.72 53:66.4;  Surgeon: George Ina,  Gwyndolyn Saxon, MD;  Location: [facility];  Service: Ophthalmology;  Laterality: Left;  [place]  ? CATARACT EXTRACTION W/PHACO Right 01/27/2020  ? Procedure: CATARACT EXTRACTION PHACO AND INTRAOCULAR LENS PLACEMENT ([place]) RIGHT VISION BLUE 26.83 02:07.6;  Surgeon: Birder Robson, MD;  Location: [facility];  Service: Ophthalmology;  Laterality: Right;  ? HERNIA REPAIR    ? IR THORACENTESIS ASP PLEURAL SPACE W/IMG GUIDE  08/29/2021  ? NECK SURGERY    ?  ? ?Social History:  ? reports that he has been smoking cigarettes. He has a 25.00 pack-year smoking history. He has never used smokeless tobacco. He reports current alcohol use. He reports that he does not use drugs.  ? ?Family History:  ?His family history includes Cancer - Colon in his father; Congestive Heart Failure in his mother.   ? ?Allergies ?Allergies  ?Allergen Reactions  ? Penicillins Anaphylaxis, Swelling and Other (See Comments)  ?  Tolerated Ceftriaxone 06/2020 ?05/2020 childhood reaction and his mother told him that he "swelled up." ? ?Had a PCN reaction causing i

## 2021-09-12 NOTE — ED Notes (Signed)
Called RT about ABG; will assist RT during collection so that 1 set of blood cultures can be obtained.  ?

## 2021-09-12 NOTE — Sepsis Progress Note (Signed)
Sepsis protocol monitored by eLink 

## 2021-09-12 NOTE — ED Notes (Signed)
EDP Malinda at bedside.  ?

## 2021-09-12 NOTE — ED Notes (Signed)
IV team unable to pull enough blood off IO for blood culture set. Provider [facility] notified. Called lab to confirm no cultures currently in lab.  ?

## 2021-09-12 NOTE — ED Notes (Signed)
ICU nurse notified IR coming to bedside for procedure in ER before pt heading up to room. Pt remains A&Ox1 (self) as unable to confirm or deny place, situation, year as continues with breathing difficulty and BiPPAP mask remains in place.  ?

## 2021-09-12 NOTE — ED Notes (Signed)
Blood culture collected by RT and this RN at R wrist during ABG collection.  ?

## 2021-09-12 NOTE — Progress Notes (Signed)
?  Interdisciplinary Goals of Care Family Meeting ? ? ?Date carried out: 09/12/2021 ? ?Location of the meeting: Phone conference ? ?Member's involved: Physician and Other: Delos Haring ? ?Durable Power of Tour manager: According to pts contact [name] who is the pts friend the pt is estranged from his family and she does not have their contact information.  Ms. Hassell Done states she DOES NOT want to make decisions regarding pt care.  She does inform me she thinks Mr. Heckler would "want everything done."  Due to inability to obtain family contact info all procedures will be performed Emer[facility] ? ?Discussion: We discussed goals of care for [facility] .   ? ?Code status: Full Code ? ?Disposition: Continue current acute care ? ?Time spent for the meeting: 15 minutes  ? ? ? ?Donell Beers, AGNP  ?Pulmonary/Critical Care ?Pager [phone] (please enter 7 digits) ?PCCM Consult Pager [phone] (please enter 7 digits)  ?

## 2021-09-12 NOTE — ED Notes (Signed)
Pt's BiPAP mask adjusted again as keeps shifting out of place.  ?
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
## 2021-09-12 NOTE — ED Notes (Signed)
EDP Malinda assessed EKG in person. ?

## 2021-09-12 NOTE — ED Notes (Signed)
Verbal from EDP Malinda to hold on hanging levophed until we see how pt's BP is post 1L NS and 1L LR boluses.  ?

## 2021-09-12 NOTE — ED Notes (Signed)
Called lab and requested phlebotomy's assistance with collecting blood-work stat. They stated they'll send someone as soon as possible.  ?

## 2021-09-12 NOTE — ED Notes (Signed)
Pt placed on BiPAP by RT at [facility] verbal order.  ?
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
## 2021-09-12 NOTE — Progress Notes (Signed)
IR received request for image guided right sided thoracentesis given respiratory distress and CXR done today with findings of large right pleural effusion. Patient presented to ED today via ems with 32-44 RR and oxygen saturation 82% with non-rebreather. patient is currently oriented to self only and unable to provide consent for the thoracentesis procedure. There is no family contact to call in order to obtain consent, a friend is listed in [place] however stated they do not wish to make any medical decisions. Thoracentesis is deemed emergently necessary given respiratory distress is an immediate threat to the patient's health.  ? ?Thoracentesis will be performed emergently today bedside. This has been discussed with the ICU NP, Meda Coffee Dreama Saa, NP who agrees and my attending, Dr. Denna Haggard who agrees with the above plan.  ? ?Tsosie Billing D, PA-C ?09/12/2021, 3:50 PM ? ? ? ?

## 2021-09-12 NOTE — ED Notes (Signed)
Phlebotomy at bedside. This RN attempted for 20g at R ac.  ?

## 2021-09-12 NOTE — ED Notes (Signed)
Called RT as having trouble with pt's mask again. State they will bring a large mask soon to exchange it.  ?

## 2021-09-12 NOTE — ED Notes (Signed)
Opal Sidles RN attempted for IV at R lower fa.  ?

## 2021-09-12 NOTE — ED Notes (Signed)
Pt repositioned several times with 3-4 pillows at a time. Pt continues favor left or right side immediately after repositioning. Pillows at hips, head, and legs to help prevent pressure wounds.  ?

## 2021-09-12 NOTE — Consult Note (Signed)
PHARMACY -  BRIEF ANTIBIOTIC NOTE  ? ?Pharmacy has received consult(s) for Levaquin initially from an ED provider.  After investigation of PCN allergy, it was deemed appropriate and safe to use cephalosporin for sepsis treatment. The patient's profile has been reviewed for ht/wt/allergies/indication/available labs.   ? ?One time order(s) placed for Rocephin 2g IV x 1 dose. ? ?Further antibiotics/pharmacy consults should be ordered by admitting physician if indicated.       ?                ?Thank you, ?Kery Haltiwanger A Elazar Argabright ?09/12/2021  1:17 PM ? ?

## 2021-09-12 NOTE — ED Notes (Signed)
Repositioned pt in bed with help of Scientist, water quality.  ?

## 2021-09-12 NOTE — ED Notes (Signed)
Informed RN bed assigned 

## 2021-09-12 NOTE — Procedures (Signed)
PROCEDURE SUMMARY: ? ?Successful US guided right thoracentesis. ?Yielded 1.5 L of amber colored fluid. ?Pt tolerated procedure well. ?No immediate complications. ? ?Specimen was sent for labs. ?CXR ordered. ? ?EBL < 5 mL ? ?Tsosie Billing D PA-C ?09/12/2021 ?4:22 PM ? ? ? ?

## 2021-09-12 NOTE — ED Notes (Signed)
Provider [facility] notified of pt's declining BP. Awaiting orders. ?

## 2021-09-12 NOTE — ED Notes (Signed)
IV team at bedside. Pt has gauze wrap from EMS's IO at L upper shin.  ?

## 2021-09-13 ENCOUNTER — Inpatient Hospital Stay: Payer: Medicare HMO

## 2021-09-13 DIAGNOSIS — J9621 Acute and chronic respiratory failure with hypoxia: Secondary | ICD-10-CM | POA: Diagnosis not present

## 2021-09-13 DIAGNOSIS — J9622 Acute and chronic respiratory failure with hypercapnia: Secondary | ICD-10-CM | POA: Diagnosis not present

## 2021-09-13 LAB — BASIC METABOLIC PANEL
Anion gap: 4 — ABNORMAL LOW (ref 5–15)
BUN: 32 mg/dL — ABNORMAL HIGH (ref 8–23)
CO2: 35 mmol/L — ABNORMAL HIGH (ref 22–32)
Calcium: 8.4 mg/dL — ABNORMAL LOW (ref 8.9–10.3)
Chloride: 93 mmol/L — ABNORMAL LOW (ref 98–111)
Creatinine, Ser: 0.62 mg/dL (ref 0.61–1.24)
GFR, Estimated: >60 mL/min (ref >60)
Glucose, Bld: 112 mg/dL — ABNORMAL HIGH (ref 70–99)
Potassium: 3.9 mmol/L (ref 3.5–5.1)
Sodium: 132 mmol/L — ABNORMAL LOW (ref 135–145)

## 2021-09-13 LAB — CBC
HCT: 29 % — ABNORMAL LOW (ref 39.0–52.0)
Hemoglobin: 9.2 g/dL — ABNORMAL LOW (ref 13.0–17.0)
MCH: 27 pg (ref 26.0–34.0)
MCHC: 31.7 g/dL (ref 30.0–36.0)
MCV: 85 fL (ref 80.0–100.0)
Platelets: 268 10*3/uL (ref 150–400)
RBC: 3.41 MIL/uL — ABNORMAL LOW (ref 4.22–5.81)
RDW: 16.2 % — ABNORMAL HIGH (ref 11.5–15.5)
WBC: 24.2 10*3/uL — ABNORMAL HIGH (ref 4.0–10.5)
nRBC: 0 % (ref 0.0–0.2)

## 2021-09-13 LAB — BLOOD GAS, ARTERIAL
Acid-Base Excess: 2.8 mmol/L — ABNORMAL HIGH (ref 0.0–2.0)
Acid-Base Excess: 11.8 mmol/L — ABNORMAL HIGH (ref 0.0–2.0)
Bicarbonate: 32.6 mmol/L — ABNORMAL HIGH (ref 20.0–28.0)
Bicarbonate: 37.2 mmol/L — ABNORMAL HIGH (ref 20.0–28.0)
Delivery systems: POSITIVE
Delivery systems: POSITIVE
Expiratory PAP: 6 cmH2O
Expiratory PAP: 5 cmH2O
FIO2: 36 %
FIO2: 28 %
Inspiratory PAP: 15 cmH2O
Inspiratory PAP: 10 cmH2O
Mechanical Rate: 10
O2 Saturation: 93.9 %
O2 Saturation: 88.6 %
PEEP: 6 cmH2O
Patient temperature: 37
Patient temperature: 37
pCO2 arterial: 76 mmHg (ref 32–48)
pCO2 arterial: 50 mmHg — ABNORMAL HIGH (ref 32–48)
pH, Arterial: 7.24 — ABNORMAL LOW (ref 7.35–7.45)
pH, Arterial: 7.48 — ABNORMAL HIGH (ref 7.35–7.45)
pO2, Arterial: 69 mmHg — ABNORMAL LOW (ref 83–108)
pO2, Arterial: 55 mmHg — ABNORMAL LOW (ref 83–108)

## 2021-09-13 LAB — PROCALCITONIN: Procalcitonin: 0.13 ng/mL

## 2021-09-13 LAB — MAGNESIUM: Magnesium: 1.9 mg/dL (ref 1.7–2.4)

## 2021-09-13 LAB — PHOSPHORUS: Phosphorus: 2.2 mg/dL — ABNORMAL LOW (ref 2.5–4.6)

## 2021-09-13 MED ORDER — ACETAMINOPHEN 325 MG PO TABS
650.0000 mg | ORAL_TABLET | Freq: Four times a day (QID) | ORAL | Status: DC | PRN
  Administered 2021-09-13 – 2021-09-15 (×2): 650 mg via ORAL
  Filled 2021-09-13 (×4): qty 2

## 2021-09-13 MED ORDER — SODIUM PHOSPHATES 45 MMOLE/15ML IV SOLN
15.0000 mmol | Freq: Once | INTRAVENOUS | Status: AC
  Administered 2021-09-13: 15 mmol via INTRAVENOUS
  Filled 2021-09-13: qty 5

## 2021-09-13 NOTE — Progress Notes (Addendum)
Initial Nutrition Assessment ? ?DOCUMENTATION CODES:  ? ?Severe malnutrition in context of chronic illness ? ?INTERVENTION:  ? ?Ensure Enlive po TID with diet advancement, each supplement provides 350 kcal and 20 grams of protein. ? ?Magic cup TID with diet advancement, each supplement provides 290 kcal and 9 grams of protein ? ?MVI po daily with diet advancement  ? ?Vitamin C 500mg  po BID with diet advancement ? ?Zing 220mg  po daily x 14 days with diet advancement  ? ?Recommend liberal diet  ? ?Pt at high refeed risk; recommend monitor potassium, magnesium and phosphorus labs daily until stable ? ?NUTRITION DIAGNOSIS:  ? ?Severe Malnutrition related to chronic illness (COPD, CHF) as evidenced by moderate fat depletion, severe muscle depletion. ? ?GOAL:  ? ?Patient will meet greater than or equal to 90% of their needs ? ?MONITOR:  ? ?Diet advancement, Labs, Weight trends, Skin, I & O's ? ?REASON FOR ASSESSMENT:  ? ?Rounds ?  ? ?ASSESSMENT:  ? ?71 y/o male with h/o CHF, COPD, HTN, GERD, depression, pulmonary nodule and malnutrition who is admitted with CAP and sepsis. ? ?Pt s/p thoracentesis 5/8 with 1.5L output  ? ?Visited pt's room today. Pt on bipap at time of RD visit but has now transitioned to nasal canula. Pt is well known to this RD from a recent previous admission. Pt with poor appetite and oral intake at baseline and with chronic malnutrition. Pt is agreeable to drinking vanilla supplements with diet advancement but usually does not consume much of his supplements in hospital. RD will add supplements and vitamins to help pt meet his estimated needs and to support wound healing once his diet is advanced. Per chart, pt is down 20lbs(12%) since June; RD unsure how recently weight loss occurred.  ? ?Medications reviewed and include: lovenox, solu-medrol, protonix, azithromycin, cefepime, Na Phosphate  ? ?Labs reviewed: Na 132(L), K 3.9 wnl, BUN 32(H), P 2.2(L), Mg 1.9 wnl ?Wbc- 24.2(H), Hgb 9.2(L), Hct  29.0(L) ? ?NUTRITION - FOCUSED PHYSICAL EXAM: ? ?Flowsheet Row Most Recent Value  ?Orbital Region Mild depletion  ?Upper Arm Region Moderate depletion  ?Thoracic and Lumbar Region Moderate depletion  ?Buccal Region Mild depletion  ?Temple Region Moderate depletion  ?Clavicle Bone Region Moderate depletion  ?Clavicle and Acromion Bone Region Moderate depletion  ?Scapular Bone Region Moderate depletion  ?Dorsal Hand Mild depletion  ?Patellar Region Severe depletion  ?Anterior Thigh Region Severe depletion  ?Posterior Calf Region Severe depletion  ?Edema (RD Assessment) None  ?Hair Reviewed  ?Eyes Reviewed  ?Mouth Reviewed  ?Skin Reviewed  ?Nails Reviewed  ? ?Diet Order:   ?Diet Order   ? ?       ?  Diet NPO time specified  Diet effective now       ?  ? ?  ?  ? ?  ? ?EDUCATION NEEDS:  ? ?Education needs have been addressed ? ?Skin:  Skin Assessment: Reviewed RN Assessment (sacral wound, ecchymosis) ? ?Last BM:  5/8- type 4 ? ?Height:  ? ?Ht Readings from Last 1 Encounters:  ?09/12/21 5\' 7"  (1.702 m)  ? ? ?Weight:  ? ?Wt Readings from Last 1 Encounters:  ?09/13/21 68.2 kg  ? ? ?Ideal Body Weight:  67.2 kg ? ?BMI:  Body mass index is 23.55 kg/m?. ? ?Estimated Nutritional Needs:  ? ?Kcal:  1900-2200kcal/day ? ?Protein:  95-110g/day ? ?Fluid:  1.6-1.9L/day ? ?Koleen Distance MS, RD, LDN ?Please refer to [facility] for RD and/or RD on-call/weekend/after hours pager ? ?

## 2021-09-13 NOTE — Consult Note (Signed)
PHARMACY CONSULT NOTE - FOLLOW UP ? ?Pharmacy Consult for Electrolyte Monitoring and Replacement  ? ?Recent Labs: ?Potassium (mmol/L)  ?Date Value  ?09/13/2021 3.9  ?10/02/2013 4.4  ? ?Magnesium (mg/dL)  ?Date Value  ?09/13/2021 1.9  ? ?Calcium (mg/dL)  ?Date Value  ?09/13/2021 8.4 (L)  ? ?Calcium, Total (mg/dL)  ?Date Value  ?10/02/2013 9.1  ? ?Albumin (g/dL)  ?Date Value  ?09/12/2021 3.1 (L)  ?10/01/2013 3.3 (L)  ? ?Phosphorus (mg/dL)  ?Date Value  ?09/13/2021 2.2 (L)  ? ?Sodium (mmol/L)  ?Date Value  ?09/13/2021 132 (L)  ?10/02/2013 136  ? ? ?Assessment: ?71yo M w/ h/o smoking daily, severe COPD/emphysema, and  chronic hypoxemic respiratory failure who is admitted with acute on chronic respiratory failure due to progressive right pleural effusion,c/f PNA and possible COPDE. Pharmacy consulted for electrolyte mgmt. ? ?Goal of Therapy:  ?Lytes WNL ? ?Plan:  ?Na: 132>132; K4.1>3.9; Phos 4.1>2.2 ?Will Replete with NaPhos 72mmol IV x1 ? ?Lorna Dibble ,PharmD ?Clinical Pharmacist ?09/13/2021 7:57 AM ? ?

## 2021-09-13 NOTE — Progress Notes (Addendum)
1400 Report received from nurs. Patient currently in IR for thorocentesis. 1530 Patient back in room. Patient alert but not oriented to time. PT in to work with patient. Patient tried to refuse and PT. After several minutes of talking patient finally worked with PT in a limited manner. Stated he was tired. Patient 's skin covered in bruises and skin tears. Nicotene burns on finger nails. Purple non stageable areas noted on sacrum with prominent stage 2 around anus and right inner buttock. Bed wet several times so unable to calculate output. Had small BM this afternoon. ?

## 2021-09-13 NOTE — Procedures (Signed)
PROCEDURE SUMMARY: ? ?Successful US guided right thoracentesis. ?Yielded 1.3 L of amber colored fluid. ?Pt tolerated procedure well. ?No immediate complications. ? ?Specimen was not sent for labs. ?CXR ordered. ? ?CXR revealed small apical PTX, patient is asymptomatic and denies any worsening chest pain or shortness of breath. Attending provider is aware of results and a follow-up CXR has been ordered for 1 hour. ? ?EBL < 1 mL ? ?Tsosie Billing D PA-C ?09/13/2021 ?2:57 PM ? ? ? ?

## 2021-09-13 NOTE — Progress Notes (Signed)
? ?NAME:  Jeremiah [name], MRN:  [id number], DOB:  26-Sep-1950, LOS: 1 ?ADMISSION DATE:  09/12/2021, CONSULTATION DATE: 09/12/2021 ?REFERRING MD: Dr, Cinda Quest, CHIEF COMPLAINT:  Shortness of Breath   ? ?History of Present Illness:  ?This is a 71 yo male with a PMH of Severe End-Stage Emphysema who presented to [facility] ER on 05/8 from [facility] via EMS with shortness of breath.  Per ER notes EMS reported pt in severe respiratory distress, therefore he was placed on NRB with O2 sats only increasing to 82%.  He also received duoneb, albuterol, and magnesium sulfate. He was recently hospitalized from 04/21-05/4 with acute on chronic hypoxic/hypercapnic respiratory failure secondary to severe end-stage emphysema; sepsis due to pneumonia; and right sided pleural effusion requiring right thoracentesis 08/29/21 (body fluid culture, legionella culture reflex negative). ? ?ED Course ?In the ER pt transitioned to Bipap due to continued respiratory distress.  CXR revealed large right pleural effusion with significant interval increase and CHF.  Lab results: Na+ 132, chloride 89, CO2 33, glucose 143, BUN 30, calcium 8.7, albumin 3.1, troponin 40, wbc 25.5, and hgb 11.2.  ABG: pH 7.23/pCO2 88/pO2 56/acid-base excess 6.1/bicarb 36.9.  Pt also hypotensive and tachycardic, sepsis protocol initiated.  Pt received 2L LR bolus, ceftriaxone, duoneb x2, and 125 mg iv solumedrol.  Due to continued hypotension levophed gtt initiated.  PCCM team contacted for ICU admission.   ? ?Pertinent  Medical History  ?Asthma ?Severe End-stage Emphysema ?O2 Dependent  ?GERD ?HTN ?Multifocal Pneumonia  ?Sepsis ?Tobacco Abuse  ? ?Significant Hospital Events: ?Including procedures, antibiotic start and stop dates in addition to other pertinent events   ?05/8: Pt admitted to ICU with acute on chronic hypoxic hypercapnic respiratory failure secondary to recurrent right-sided pleural effusion and sepsis secondary to possible pneumonia requiring Bipap and  levophed gtt  ?05/8: Right-sided thoracentesis performed per IR with removal of 1.5L of pleural fluid  ?05/9: Pt off levophed gtt, alert/oriented, on Bipap FiO2 28% will transition off Bipap ? ?Interim History / Subjective:  ?Pts respiratory status significantly improved no signs of respiratory distress s/p right-sided thoracentesis 05/8.  Plan to transition pt off Bipap to nasal canula today  ? ?Objective   ?Blood pressure 103/66, pulse 98, temperature 97.9 ?F (36.6 ?C), temperature source Axillary, resp. rate 18, height 5\' 7"  (1.702 m), weight 68.2 kg, SpO2 100 %. ?   ?FiO2 (%):  [36 %] 36 %  ? ?Intake/Output Summary (Last 24 hours) at 09/13/2021 0912 ?Last data filed at 09/13/2021 0600 ?Gross per 24 hour  ?Intake 339.04 ml  ?Output 1500 ml  ?Net -1160.96 ml  ? ?Filed Weights  ? 09/12/21 1058 09/12/21 1540 09/13/21 0500  ?Weight: 68 kg 68.5 kg 68.2 kg  ? ? ?Examination: ?General: Acute on chronically ill appearing male, NAD on Bipap  ?HENT: Supple, mild JVD  ?Lungs: Rhonchi throughout, even, non labored  ?Cardiovascular: NSR, rrr, no r/g, 2+ radial/1+ distal pulses; trace bilateral lower extremity edema  ?Abdomen: +BS x4, soft, non tender, non distended  ?Extremities: Normal tone and moves all extremities  ?Neuro: Alert and oriented, follows commands, PERRLA  ?Skin: Sacral spine pressure ulcer.  Scattered ecchymosis. See Below  ? ? ?GU: Deferred ? ?Resolved Hospital Problem list   ?Hypotension  ? ?Assessment & Plan:  ?Acute on chronic hypoxic hypercapnic respiratory failure secondary to recurrent right-sided pleural effusion and possible pneumonia s/p thoracentesis 05/8 with removal of 1.5L pleural fluid  ?Severe end stage Emphysema  ?Hx: Left lower lobe pulmonary nodule  and tobacco abuse  ?- Prn Bipap for dyspnea and/or hypoxia  ?- IV and nebulized steroids ?- Scheduled and prn bronchodilator therapy  ?- Smoking cessation counseling provided  ?- Continue nicotine patch  ?- Repeat CXR pending  ? ?Sepsis with septic  shock  ?Mildly elevated troponin secondary to demand ischemia in the setting of acute respiratory failure  ?- Continuous telemetry monitoring  ?- Prn levophed gtt to maintain map >65 ?- Hold outpatient antihypertensives ? ?Hyponatremia  ?Hypophosphatemia  ?- Trend BMP  ?- Replace electrolytes as indicated  ?- Monitor UOP ? ?Leukocytosis secondary to pneumonia  ?- Trend WBC and monitor fever curve ?- Trend PCT  ?- Follow culture results  ?- Follow right-sided pleural fluid cytology  ?- Continue cefepime  ? ?Chronic anemia  ?- Trend CBC ?- Transfuse for hgb <7 ?- Monitor s/sx of bleeding  ?- Will resume outpatient ferrous sulfate once able to take po medications  ? ?GERD  ?- Continue PPI  ? ?Severe protein calorie malnutrition  ?- Dietitian consulted appreciate input  ? ?Best Practice (right click and "Reselect all SmartList Selections" daily)  ? ?Diet/type: NPO; will place diet order once pt stable off Bipap  ?DVT prophylaxis: LMWH ?GI prophylaxis: PPI ?Lines: N/A ?Foley:  N/A ?Code Status:  full code ?Last date of multidisciplinary goals of care discussion [09/13/2021] ? ?Attempted to update pts friend [name] listed under pt contacts via telephone to provide update regarding pts condition, however she did not answer and voicemail mailbox is full.  Will attempt to contact her again later today  ?Labs   ?CBC: ?Recent Labs  ?Lab 09/07/21 ?0350 09/08/21 ?0503 09/12/21 ?1123 09/13/21 ?9381  ?WBC 23.3* 20.6* 25.5* 24.2*  ?NEUTROABS  --   --  23.4*  --   ?HGB 10.8* 10.9* 11.2* 9.2*  ?HCT 33.9* 34.6* 35.5* 29.0*  ?MCV 87.1 87.4 87.4 85.0  ?PLT 237 249 293 268  ? ? ?Basic Metabolic Panel: ?Recent Labs  ?Lab 09/07/21 ?0350 09/08/21 ?0503 09/12/21 ?1123 09/13/21 ?8299  ?NA 137 138 132* 132*  ?K 4.7 4.4 4.1 3.9  ?CL 92* 94* 89* 93*  ?CO2 38* 40* 33* 35*  ?GLUCOSE 79 89 143* 112*  ?BUN 33* 30* 30* 32*  ?CREATININE 0.56* 0.56* 0.63 0.62  ?CALCIUM 9.4 8.9 8.7* 8.4*  ?MG 2.1 2.0 1.9 1.9  ?PHOS  --   --  4.1 2.2*   ? ?GFR: ?Estimated Creatinine Clearance: 79.2 mL/min (by C-G formula based on SCr of 0.62 mg/dL). ?Recent Labs  ?Lab 09/07/21 ?0350 09/08/21 ?0503 09/12/21 ?1123 09/12/21 ?1255 09/12/21 ?1943 09/13/21 ?3716  ?PROCALCITON  --   --   --  0.11  --  0.13  ?WBC 23.3* 20.6* 25.5*  --   --  24.2*  ?LATICACIDVEN  --   --  1.4  --  2.2*  --   ? ? ?Liver Function Tests: ?Recent Labs  ?Lab 09/12/21 ?1123  ?AST 28  ?ALT 30  ?ALKPHOS 102  ?BILITOT 0.9  ?PROT 6.3*  ?ALBUMIN 3.1*  ? ?No results for input(s): LIPASE, AMYLASE in the last 168 hours. ?No results for input(s): AMMONIA in the last 168 hours. ? ?ABG ?   ?Component Value Date/Time  ? PHART 7.48 (H) 09/13/2021 0500  ? PCO2ART 50 (H) 09/13/2021 0500  ? PO2ART 55 (L) 09/13/2021 0500  ? HCO3 37.2 (H) 09/13/2021 0500  ? ACIDBASEDEF 1.2 06/09/2020 2326  ? O2SAT 88.6 09/13/2021 0500  ?  ? ?Coagulation Profile: ?Recent Labs  ?Lab 09/12/21 ?  1943  ?INR 1.0  ? ? ?Cardiac Enzymes: ?No results for input(s): CKTOTAL, CKMB, CKMBINDEX, TROPONINI in the last 168 hours. ? ?HbA1C: ?No results found for: HGBA1C ? ?CBG: ?Recent Labs  ?Lab 09/12/21 ?1543  ?GLUCAP 155*  ? ? ?Review of Systems:   ?Unable to assess pt lethargic on Bipap ? ?Past Medical History:  ?He,  has a past medical history of Acute and chronic respiratory failure (acute-on-chronic) ([place]) (10/18/2020), Asthma, COPD (chronic obstructive pulmonary disease) ([facility]), GERD (gastroesophageal reflux disease), HTN (hypertension), Multifocal pneumonia (06/16/2020), Other emphysema ([place]) (08/22/2016), Oxygen dependent, Sepsis ([place]) (05/14/2020), and Tobacco dependence.  ? ?Surgical History:  ? ?Past Surgical History:  ?Procedure Laterality Date  ? CATARACT EXTRACTION W/PHACO Left 12/09/2019  ? Procedure: CATARACT EXTRACTION PHACO AND INTRAOCULAR LENS PLACEMENT (IOC) COMPLICATED LEFT 81.15 72:62.0;  Surgeon: Birder Robson, MD;  Location: [place];  Service: Ophthalmology;  Laterality: Left;  [place]  ? CATARACT  EXTRACTION W/PHACO Right 01/27/2020  ? Procedure: CATARACT EXTRACTION PHACO AND INTRAOCULAR LENS PLACEMENT (IOC) RIGHT VISION BLUE 26.83 02:07.6;  Surgeon: Birder Robson, MD;  Location: [place];  Service: Claiborne Rigg

## 2021-09-13 NOTE — Progress Notes (Signed)
Patient's friend [name] updated via phone by this RN about patient's improvements. Annette to visit patient later today per our conversation on phone.  ?

## 2021-09-13 NOTE — Evaluation (Signed)
Physical Therapy Evaluation ?Patient Details ?Name: Jeremiah [name] ?MRN: [id number] ?DOB: Feb 13, 1951 ?Today's Date: 09/13/2021 ? ?History of Present Illness ? 71 year old male with history of tobacco dependence, depression, anxiety, COPD, hypertension, GERD, chronic respiratory failure on 4-5L O2, who was recently discharged to SNF, returns with breathing issues - thoracentesis 5/9.  ?Clinical Impression ? Pt initially hesitant to do a lot with PT, actually requesting to [place] to come back and start tomorrow but with cuing from RN and PT he did agree to do some exercises and was even willing to try a brief bout of standing after additionally cuing and encouragement.  Pt is weak but was able to do some lightly resisted LE exercises, no AROM against gravity.  He was on 3L t/o the session and despite SpO2 staying in the high 90s nearly the entire time pt repeating that he is exhausted, struggling to breath and he remains focused on his numbers t/o the session.  Pt tolerated only 10-15 seconds of standing and was very tired after the effort, needed mod assist just to get LE back up and into bed.  Pt will require return to rehab when medically ready for d/c.    ? ?Recommendations for follow up therapy are one component of a multi-disciplinary discharge planning process, led by the attending physician.  Recommendations may be updated based on patient status, additional functional criteria and insurance authorization. ? ?Follow Up Recommendations Skilled nursing-short term rehab (<3 hours/day) ? ?  ?Assistance Recommended at Discharge Frequent or constant Supervision/Assistance  ?Patient can return home with the following ? Two people to help with walking and/or transfers;A lot of help with bathing/dressing/bathroom;Assistance with cooking/housework;Assist for transportation;Help with stairs or ramp for entrance ? ?  ?Equipment Recommendations  (TBD at rehab)  ?Recommendations for Other Services ?    ?  ?Functional Status  Assessment Patient has had a recent decline in their functional status and demonstrates the ability to make significant improvements in function in a reasonable and predictable amount of time.  ? ?  ?Precautions / Restrictions Precautions ?Precautions: Fall ?Restrictions ?Weight Bearing Restrictions: No  ? ?  ? ?Mobility ? Bed Mobility ?Overal bed mobility: Needs Assistance ?Bed Mobility: Supine to Sit ?Rolling: Min assist ?  ?Supine to sit: Min assist ?Sit to supine: Mod assist ?  ?General bed mobility comments: pt very fatigued after minimal stance effort, needed heavy assist with getting back to supine ?  ? ?Transfers ?Overall transfer level: Needs assistance ?Equipment used: Rolling walker (2 wheels) ?Transfers: Sit to/from Stand ?Sit to Stand: Max assist, Mod assist ?  ?  ?  ?  ?  ?General transfer comment: Pt uanble to rise on first attempt with min assist, extra cuing for set up and motivation, able to rise, briefly, with heavier assist on second attempt, tolerates <15 seconds before needing to sit ?  ? ?Ambulation/Gait ?  ?  ?  ?  ?  ?  ?  ?General Gait Details: unable ? ?Stairs ?  ?  ?  ?  ?  ? ?Wheelchair Mobility ?  ? ?Modified Rankin (Stroke Patients Only) ?  ? ?  ? ?Balance Overall balance assessment: Needs assistance ?Sitting-balance support: Feet supported, Bilateral upper extremity supported ?Sitting balance-Leahy Scale: Fair ?  ?  ?Standing balance support: Bilateral upper extremity supported ?Standing balance-Leahy Scale: Poor ?  ?  ?  ?  ?  ?  ?  ?  ?  ?  ?  ?  ?   ? ? ? ?  Pertinent Vitals/Pain Pain Assessment ?Pain Assessment: 0-10 ?Pain Score: 4  ?Pain Location: low grade chest pain with breathing  ? ? ?Home Living Family/patient expects to be discharged to:: Skilled nursing facility ?Living Arrangements: Non-relatives/Friends ?Available Help at Discharge: Friend(s);Available PRN/intermittently ?Type of Home: Mobile home ?Home Access: Stairs to enter ?Entrance Stairs-Rails: Right;Left ?Entrance  Stairs-Number of Steps: 10 at front with b/l (wide) rails ?  ?Home Layout: One level ?Home Equipment: BSC/3in1;Rolling Walker (2 wheels) ?   ?  ?Prior Function Prior Level of Function : Needs assist ?  ?  ?  ?  ?  ?  ?Mobility Comments: Uses RW at home, [facility] in community (health care appts). Reports rarely leaves home ?  ?  ? ? ?Hand Dominance  ?   ? ?  ?Extremity/Trunk Assessment  ? Upper Extremity Assessment ?Upper Extremity Assessment: Generalized weakness (very limited shoulder AROM, grossly 3+/5 t/o in available range) ?  ? ?Lower Extremity Assessment ?Lower Extremity Assessment: Generalized weakness (unable to SLR, grossly 4-/5 t/o) ?  ? ?   ?Communication  ? Communication: No difficulties  ?Cognition Arousal/Alertness: Awake/alert ?Behavior During Therapy: Anxious ?Overall Cognitive Status: Within Functional Limits for tasks assessed ?  ?  ?  ?  ?  ?  ?  ?  ?  ?  ?  ?  ?  ?  ?  ?  ?General Comments: highly anxious and needing extra cuing to simply participate ?  ?  ? ?  ?General Comments   ? ?  ?Exercises General Exercises - Lower Extremity ?Heel Slides: Strengthening, 5 reps (with resisted leg ext) ?Hip ABduction/ADduction: 5 reps, AROM ?Straight Leg Raises: AAROM, 5 reps  ? ?Assessment/Plan  ?  ?PT Assessment Patient needs continued PT services  ?PT Problem List Decreased strength;Decreased range of motion;Decreased activity tolerance;Decreased balance;Decreased mobility;Decreased knowledge of use of DME;Decreased safety awareness ? ?   ?  ?PT Treatment Interventions DME instruction;Gait training;Functional mobility training;Therapeutic activities;Therapeutic exercise;Balance training;Neuromuscular re-education   ? ?PT Goals (Current goals can be found in the Care Plan section)  ?Acute Rehab PT Goals ?Patient Stated Goal: to go home. ?PT Goal Formulation: With patient ?Time For Goal Achievement: 09/27/21 ?Potential to Achieve Goals: Fair ? ?  ?Frequency Min 2X/week ?  ? ? ?Co-evaluation   ?  ?  ?  ?  ? ? ?   ?AM-PAC PT "6 Clicks" Mobility  ?Outcome Measure Help needed turning from your back to your side while in a flat bed without using bedrails?: A Lot ?Help needed moving from lying on your back to sitting on the side of a flat bed without using bedrails?: A Lot ?Help needed moving to and from a bed to a chair (including a wheelchair)?: Total ?Help needed standing up from a chair using your arms (e.g., wheelchair or bedside chair)?: Total ?Help needed to walk in hospital room?: Total ?Help needed climbing 3-5 steps with a railing? : Total ?6 Click Score: 8 ? ?  ?End of Session Equipment Utilized During Treatment: Oxygen (3L) ?Activity Tolerance: Patient limited by fatigue ?Patient left: in bed;with call bell/phone within reach;with nursing/sitter in room ?Nurse Communication: Mobility status ?PT Visit Diagnosis: Unsteadiness on feet (R26.81);Other abnormalities of gait and mobility (R26.89);Muscle weakness (generalized) (M62.81);Difficulty in walking, not elsewhere classified (R26.2) ?  ? ?Time: 7262-0355 ?PT Time Calculation (min) (ACUTE ONLY): 26 min ? ? ?Charges:   PT Evaluation ?$PT Eval Low Complexity: 1 Low ?PT Treatments ?$Therapeutic Exercise: 8-22 mins ?  ?   ? ? ?  Kreg Shropshire, DPT ?09/13/2021, 6:13 PM ? ?

## 2021-09-14 ENCOUNTER — Inpatient Hospital Stay: Payer: Medicare HMO

## 2021-09-14 DIAGNOSIS — J9622 Acute and chronic respiratory failure with hypercapnia: Secondary | ICD-10-CM | POA: Diagnosis not present

## 2021-09-14 DIAGNOSIS — J90 Pleural effusion, not elsewhere classified: Secondary | ICD-10-CM

## 2021-09-14 DIAGNOSIS — J9621 Acute and chronic respiratory failure with hypoxia: Secondary | ICD-10-CM | POA: Diagnosis not present

## 2021-09-14 LAB — BASIC METABOLIC PANEL
Anion gap: 6 (ref 5–15)
BUN: 27 mg/dL — ABNORMAL HIGH (ref 8–23)
CO2: 34 mmol/L — ABNORMAL HIGH (ref 22–32)
Calcium: 7.9 mg/dL — ABNORMAL LOW (ref 8.9–10.3)
Chloride: 93 mmol/L — ABNORMAL LOW (ref 98–111)
Creatinine, Ser: 0.59 mg/dL — ABNORMAL LOW (ref 0.61–1.24)
GFR, Estimated: >60 mL/min (ref >60)
Glucose, Bld: 80 mg/dL (ref 70–99)
Potassium: 4 mmol/L (ref 3.5–5.1)
Sodium: 133 mmol/L — ABNORMAL LOW (ref 135–145)

## 2021-09-14 LAB — CBC WITH DIFFERENTIAL/PLATELET
Abs Immature Granulocytes: 0.15 10*3/uL — ABNORMAL HIGH (ref 0.00–0.07)
Basophils Absolute: 0 10*3/uL (ref 0.0–0.1)
Basophils Relative: 0 %
Eosinophils Absolute: 0 10*3/uL (ref 0.0–0.5)
Eosinophils Relative: 0 %
HCT: 25.9 % — ABNORMAL LOW (ref 39.0–52.0)
Hemoglobin: 8.5 g/dL — ABNORMAL LOW (ref 13.0–17.0)
Immature Granulocytes: 1 %
Lymphocytes Relative: 6 %
Lymphs Abs: 1.1 10*3/uL (ref 0.7–4.0)
MCH: 27 pg (ref 26.0–34.0)
MCHC: 32.8 g/dL (ref 30.0–36.0)
MCV: 82.2 fL (ref 80.0–100.0)
Monocytes Absolute: 1.1 10*3/uL — ABNORMAL HIGH (ref 0.1–1.0)
Monocytes Relative: 5 %
Neutro Abs: 17.2 10*3/uL — ABNORMAL HIGH (ref 1.7–7.7)
Neutrophils Relative %: 88 %
Platelets: 277 10*3/uL (ref 150–400)
RBC: 3.15 MIL/uL — ABNORMAL LOW (ref 4.22–5.81)
RDW: 16.5 % — ABNORMAL HIGH (ref 11.5–15.5)
WBC: 19.6 10*3/uL — ABNORMAL HIGH (ref 4.0–10.5)
nRBC: 0 % (ref 0.0–0.2)

## 2021-09-14 LAB — PROCALCITONIN: Procalcitonin: <0.1 ng/mL

## 2021-09-14 LAB — PHOSPHORUS: Phosphorus: 1.9 mg/dL — ABNORMAL LOW (ref 2.5–4.6)

## 2021-09-14 LAB — MAGNESIUM: Magnesium: 1.8 mg/dL (ref 1.7–2.4)

## 2021-09-14 MED ORDER — ZINC SULFATE 220 (50 ZN) MG PO CAPS
220.0000 mg | ORAL_CAPSULE | Freq: Every day | ORAL | Status: DC
  Administered 2021-09-15 – 2021-09-21 (×6): 220 mg via ORAL
  Filled 2021-09-14 (×7): qty 1

## 2021-09-14 MED ORDER — ENSURE ENLIVE PO LIQD
237.0000 mL | Freq: Three times a day (TID) | ORAL | Status: DC
  Administered 2021-09-14: 237 mL via ORAL

## 2021-09-14 MED ORDER — ADULT MULTIVITAMIN W/MINERALS CH
1.0000 | ORAL_TABLET | Freq: Every day | ORAL | Status: DC
  Administered 2021-09-15 – 2021-09-21 (×6): 1 via ORAL
  Filled 2021-09-14 (×7): qty 1

## 2021-09-14 MED ORDER — DEXTROSE 5 % IV SOLN
15.0000 mmol | Freq: Once | INTRAVENOUS | Status: AC
  Administered 2021-09-14: 15 mmol via INTRAVENOUS
  Filled 2021-09-14: qty 5

## 2021-09-14 MED ORDER — ENSURE ENLIVE PO LIQD
237.0000 mL | Freq: Three times a day (TID) | ORAL | Status: DC
  Administered 2021-09-14 – 2021-09-21 (×16): 237 mL via ORAL

## 2021-09-14 MED ORDER — ASCORBIC ACID 500 MG PO TABS
500.0000 mg | ORAL_TABLET | Freq: Two times a day (BID) | ORAL | Status: DC
  Administered 2021-09-14 – 2021-09-21 (×13): 500 mg via ORAL
  Filled 2021-09-14 (×14): qty 1

## 2021-09-14 NOTE — Progress Notes (Signed)
[facility] Progress Note ? ?Patient: Jeremiah [name]    EXB:[id number]  DOA: 09/12/2021    ? ?Date of Service: the patient was seen and examined on 09/14/2021 ? ?Chief Complaint  ?Patient presents with  ? Respiratory Distress  ? ?Brief hospital course: ? 71 yo male with a PMH of Severe End-Stage Emphysema who presented to [facility] ER on 05/8 from [facility] via EMS with shortness of breath.  Per ER notes EMS reported pt in severe respiratory distress, therefore he was placed on NRB with O2 sats only increasing to 82%.  He also received duoneb, albuterol, and magnesium sulfate. He was recently hospitalized from 04/21-05/4 with acute on chronic hypoxic/hypercapnic respiratory failure secondary to severe end-stage emphysema; sepsis due to pneumonia; and right sided pleural effusion requiring right thoracentesis 08/29/21 (body fluid culture, legionella culture reflex negative). ?  ?ED Course ?In the ER pt transitioned to Bipap due to continued respiratory distress.  CXR revealed large right pleural effusion with significant interval increase and CHF.  Lab results: Na+ 132, chloride 89, CO2 33, glucose 143, BUN 30, calcium 8.7, albumin 3.1, troponin 40, wbc 25.5, and hgb 11.2.  ABG: pH 7.23/pCO2 88/pO2 56/acid-base excess 6.1/bicarb 36.9.  Pt also hypotensive and tachycardic, sepsis protocol initiated.  Pt received 2L LR bolus, ceftriaxone, duoneb x2, and 125 mg iv solumedrol.  Due to continued hypotension levophed gtt initiated.  PCCM team contacted for ICU admission.   ? ? ?Assessment and Plan: ? ?Acute on chronic hypoxic hypercapnic respiratory failure secondary to recurrent right-sided pleural effusion and possible pneumonia s/p thoracentesis 05/8 with removal of 1.5L pleural fluid and another right total on 5/9 with removal of 1.3L fluid ?Severe end stage Emphysema  ?Hx: Left lower lobe pulmonary nodule and tobacco abuse  ?- Prn Bipap for dyspnea and/or hypoxia  ?- IV and nebulized steroids ?- Scheduled and  prn bronchodilator therapy  ?- Smoking cessation counseling provided  ?- Continue nicotine patch  ?-5/9 CXR Stable to slightly smaller right-sided pneumothorax. ?5/10 CT chest: right-sided hydropneumothorax, grossly unchanged by recent radiographs. The right pleural effusion has decreased. improved aeration of the right middle and lower lobes with persistent central bronchial occlusion and perihilar opacity which could reflect a mass, suboptimally evaluated without contrast. Correlation with thoracentesis results recommended. If inconclusive, bronchoscopy may be warranted. ?5/8 cytology is in process ?Follow pulmonary for possible bronchoscopy ? ? ?  ?Sepsis with septic shock  ?Mildly elevated troponin secondary to demand ischemia in the setting of acute respiratory failure  ?- Continuous telemetry monitoring  ?- s/p Prn levophed gtt to maintain map >65 ?- Hold outpatient antihypertensives ?  ? ?Hyponatremia  ?Hypophosphatemia  ?- Trend BMP  ?- Replace electrolytes as indicated  ?- Monitor UOP ?  ?Pneumonia, ?- Trend WBC and monitor fever curve ?- Trend PCT  ?-Fluid culture and blood culture negative  ?- Follow right-sided pleural fluid cytology  ?- Continue cefepime and Zyvox ?  ?Chronic anemia  ?- Trend CBC ?- Transfuse for hgb <7 ?- Monitor s/sx of bleeding  ?- Will resume outpatient ferrous sulfate once able to take po medications  ?  ?GERD  ?- Continue PPI  ?  ?Severe protein calorie malnutrition  ?- Dietitian consulted appreciate input  ? ? ? ?Body mass index is 24.52 kg/m?Marland Kitchen  ?Nutrition Problem: Severe Malnutrition ?Etiology: chronic illness (COPD, CHF) ?Interventions: ?  ? ?Pressure Injury 06/15/21 Coccyx Medial Stage 2 -  Partial thickness loss of dermis presenting as a shallow open injury with  a red, pink wound bed without slough. pink, first layer gone (Active)  ?06/15/21 0800  ?Location: Coccyx  ?Location Orientation: Medial  ?Staging: Stage 2 -  Partial thickness loss of dermis presenting as a shallow  open injury with a red, pink wound bed without slough.  ?Wound Description (Comments): pink, first layer gone  ?Present on Admission: Yes  ?   ?Pressure Injury 06/16/21 Buttocks Right Stage 2 -  Partial thickness loss of dermis presenting as a shallow open injury with a red, pink wound bed without slough. (Active)  ?06/16/21 2322  ?Location: Buttocks  ?Location Orientation: Right  ?Staging: Stage 2 -  Partial thickness loss of dermis presenting as a shallow open injury with a red, pink wound bed without slough.  ?Wound Description (Comments):   ?Present on Admission: Yes  ?  ? ?Diet: Heart healthy ?DVT Prophylaxis: Subcutaneous Lovenox  ? ?Advance goals of care discussion: Full code ? ?Family Communication: family was NOT present at bedside, at the time of interview.  ?The pt provided permission to discuss medical plan with the family. Opportunity was given to ask question and all questions were answered satisfactorily.  ? ?Disposition:  ?Pt is from SNF, admitted with recurrent pleural effusion and found to have possible right lung cancer, still has respiratory failure and possible pneumonia on IV antibiotics, which precludes a safe discharge. ?Discharge to SNF, when clinically stable, awaiting for pulm consult as well and cytology report. ? ?Subjective: No significant overnight issues, patient was downgraded from ICU, picked up on 5/10, patient breathing is improving, thoracentesis was done twice, tolerated procedure well.  Patient denied any chest pain or palpitations, no any other active issues. ? ? ?Physical Exam: ?General:   NAD, mild SOB.  ?Appear in mild distress, affect appropriate ?Eyes: PERRLA ?ENT: Oral Mucosa Clear, moist  ?Neck: no JVD,  ?Cardiovascular: S1 and S2 Present, no Murmur,  ?Respiratory: increased respiratory effort, Bilateral Air entry equal and Decreased, b/l basal Crackles, no wheezes but audible conducting sounds ?Abdomen: Bowel Sound present, Soft and no tenderness,  ?Skin: no  rashes ?Extremities: 2+ Pedal edema, no calf tenderness ?Neurologic: without any new focal findings ?Gait not checked due to patient safety concerns ? ?Vitals:  ? 09/14/21 1000 09/14/21 1100 09/14/21 1116 09/14/21 1117  ?BP: 96/60 94/70 99/64    ?Pulse: 94 92 67 92  ?Resp: (!) 22 20    ?Temp: 98.2 ?F (36.8 ?C)  97.9 ?F (36.6 ?C)   ?TempSrc: Oral  Oral   ?SpO2: 99% 99% (!) 61% 99%  ?Weight:      ?Height:      ? ? ?Intake/Output Summary (Last 24 hours) at 09/14/2021 1321 ?Last data filed at 09/14/2021 1100 ?Gross per 24 hour  ?Intake 600 ml  ?Output 1125 ml  ?Net -525 ml  ? ?Filed Weights  ? 09/12/21 1540 09/13/21 0500 09/14/21 0423  ?Weight: 68.5 kg 68.2 kg 71 kg  ? ? ?Data Reviewed: ?I have personally reviewed and interpreted daily labs, tele strips, imagings as discussed above. ?I reviewed all nursing notes, pharmacy notes, vitals, pertinent old records ?I have discussed plan of care as described above with RN and patient/family. ? ?CBC: ?Recent Labs  ?Lab 09/08/21 ?0503 09/12/21 ?1123 09/13/21 ?4268 09/14/21 ?3419  ?WBC 20.6* 25.5* 24.2* 19.6*  ?NEUTROABS  --  23.4*  --  17.2*  ?HGB 10.9* 11.2* 9.2* 8.5*  ?HCT 34.6* 35.5* 29.0* 25.9*  ?MCV 87.4 87.4 85.0 82.2  ?PLT 249 293 268 277  ? ?Basic Metabolic Panel: ?  Recent Labs  ?Lab 09/08/21 ?0503 09/12/21 ?1123 09/13/21 ?0923 09/14/21 ?3007  ?NA 138 132* 132* 133*  ?K 4.4 4.1 3.9 4.0  ?CL 94* 89* 93* 93*  ?CO2 40* 33* 35* 34*  ?GLUCOSE 89 143* 112* 80  ?BUN 30* 30* 32* 27*  ?CREATININE 0.56* 0.63 0.62 0.59*  ?CALCIUM 8.9 8.7* 8.4* 7.9*  ?MG 2.0 1.9 1.9 1.8  ?PHOS  --  4.1 2.2* 1.9*  ? ? ?Studies: ?CT CHEST WO CONTRAST ? ?Result Date: 09/14/2021 ?CLINICAL DATA:  Pleural effusion, malignancy suspected. Respiratory distress with hypoxemia. * Tracking Code: BO * EXAM: CT CHEST WITHOUT CONTRAST TECHNIQUE: Multidetector CT imaging of the chest was performed following the standard protocol without IV contrast. RADIATION DOSE REDUCTION: This exam was performed according to the  departmental dose-optimization program which includes automated exposure control, adjustment of the mA and/or kV according to patient size and/or use of iterative reconstruction technique. COMPARISON:  Chest CT 04/2

## 2021-09-14 NOTE — TOC Initial Note (Signed)
Transition of Care (TOC) - Initial/Assessment Note  ? ? ?Patient Details  ?Name: Jeremiah [name] ?MRN: [id number] ?Date of Birth: 1951/04/14 ? ?Transition of Care (TOC) CM/SW Contact:    ?Alberteen Sam, LCSW ?Phone Number: ?09/14/2021, 11:39 AM ? ?Clinical Narrative:                 ? ?CSW notes patient is from short term rehab at [facility] , patient noted to not be fully oriented. Patient's friend [name] has been contacted no answer. Unable to lvm.  ? ?CSW will continue to attempt to confirm with [name] plan is for patient to return to [facility] as PT recs are SNF.  ? ?Expected Discharge Plan: [place] ?Barriers to Discharge: Continued Medical Work up ? ? ?Patient Goals and CMS Choice ?Patient states their goals for this hospitalization and ongoing recovery are:: to go home ?CMS Medicare.gov Compare Post Acute Care list provided to:: Patient Represenative (must comment) (friend) ?  ? ?Expected Discharge Plan and Services ?Expected Discharge Plan: [place] ?  ?  ?  ?  ?                ?  ?  ?  ?  ?  ?  ?  ?  ?  ?  ? ?Prior Living Arrangements/Services ?  ?Lives with:: Self ?  ?       ?  ?  ?  ?  ? ?Activities of Daily Living ?  ?  ? ?Permission Sought/Granted ?  ?  ?   ?   ?   ?   ? ?Emotional Assessment ?  ?  ?  ?  ?  ?  ? ?Admission diagnosis:  Acute on chronic respiratory failure ([place]) [J96.20] ?Patient Active Problem List  ? Diagnosis Date Noted  ? Acute on chronic respiratory failure ([place]) 09/12/2021  ? Insomnia 09/02/2021  ? Hypotension 09/01/2021  ? Left lower lobe pulmonary nodule 08/26/2021  ? Depression 08/26/2021  ? Sepsis due to pneumonia ([place]) 08/26/2021  ? Pleural effusion on right 08/26/2021  ? Chronic diastolic CHF (congestive heart failure) ([place]) 07/01/2021  ? Chronic hypoxemic respiratory failure ([place]) 07/01/2021  ? CAP (community acquired pneumonia) 06/30/2021  ? COPD with acute exacerbation ([place]) 06/30/2021  ? Thrush, oral 06/16/2021  ? Unintentional weight loss  06/16/2021  ? Pressure injury of skin 06/16/2021  ? Protein-calorie malnutrition, severe 06/16/2021  ? Dehydration   ? Syncope 06/15/2021  ? GERD (gastroesophageal reflux disease) 05/28/2021  ? Acute respiratory failure with hypoxia ([place]) 05/28/2021  ? Anemia 10/18/2020  ? COPD exacerbation ([place]) 05/14/2020  ? Nicotine dependence 05/14/2020  ? Hyponatremia 05/14/2020  ? HTN (hypertension) 05/14/2020  ? Abnormal LFTs 05/14/2020  ? Acute on chronic respiratory failure with hypoxia ([place]) 10/15/2016  ? Tobacco dependency 10/15/2016  ? ?PCP:  [place]:   ?[facility] ?[address], [address] ?[place] [address] ?Phone: [phone] Fax: [phone] ? ?[place], [place] ?[address] ?[place] [address] ?Phone: [phone] Fax: [phone] ? ?[place], [place] ?[address] ?[place] [address] ?Phone: [phone] Fax: [phone] ? ? ? ? ?Social Determinants of Health (SDOH) Interventions ?  ? ?Readmission Risk Interventions ? ?  08/29/2021  ?  2:00 PM 10/21/2020  ?  1:14 PM 09/27/2020  ? 11:18 AM  ?Readmission Risk Prevention  Plan  ?Transportation Screening Complete Complete Complete  ?PCP or Specialist Appt within 3-5 Days  Complete Complete  ?[place] or Home Care Consult  Complete Complete  ?Social Work Consult for [place] Planning/Counseling  Complete Not Complete  ?SW consult not completed comments   RNCM assigned to patient  ?Palliative Care Screening  Not Applicable Not Applicable  ?Medication Review Press photographer) Complete Complete Complete  ?PCP or Specialist appointment within 3-5 days of discharge Complete    ?[place] or Home Care Consult Complete    ?SW Recovery Care/Counseling Consult Complete    ?Palliative Care Screening Not Applicable    ?[place] Not Complete    ?SNF Comments waiting on PT recomendation     ? ? ? ?

## 2021-09-14 NOTE — Progress Notes (Signed)
Nutrition Follow-up ? ?DOCUMENTATION CODES:  ? ?Severe malnutrition in context of chronic illness ? ?INTERVENTION:  ? ?-Downgrade diet to dysphagia 3 diet for ease of intake ?-Ensure Enlive po TID, each supplement provides 350 kcal and 20 grams of protein ?-Magic cup TID with meals, each supplement provides 290 kcal and 9 grams of protein  ?-MVI with minerals daily ?-500 mg vitamin C BID ?-220 mg zinc sulfate daily x 14 days ?-Recommend monitoring Mg, K, and Phos daily due to refeeding risk ? ?NUTRITION DIAGNOSIS:  ? ?Severe Malnutrition related to chronic illness (COPD, CHF) as evidenced by moderate fat depletion, severe muscle depletion. ? ?Ongoing ? ?GOAL:  ? ?Patient will meet greater than or equal to 90% of their needs ? ?Progressing  ? ?MONITOR:  ? ?Diet advancement, Labs, Weight trends, Skin, I & O's ? ?REASON FOR ASSESSMENT:  ? ?Rounds ?  ? ?ASSESSMENT:  ? ?71 y/o male with h/o CHF, COPD, HTN, GERD, depression, pulmonary nodule and malnutrition who is admitted with CAP and sepsis. ? ?5/9- advanced to 2 gram sodium diet, s/p rt thoracentesis (1.3 L fluid removed) ? ?Reviewed I/O's: +547 ml x 24 hours and -614 ml since admission ? ?UOP: 650 ml x 24 hours ? ?Pt receiving nursing care at time of visit.  ? ?Pt familiar to this RD from prior admission. He historically has a very poor appetite. Noted meal completions 20%. Pt would greatly benefit from addition of oral nutrition supplements. RD will also mechanically downgrade diet for ease of intake.   ? ?Medications reviewed and include vitamin C, solu-medrol, and zinc sulfate.  ? ?Labs reviewed: Na: 133, CBGS: 155.  ? ?Diet Order:   ?Diet Order   ? ?       ?  Diet 2 gram sodium Room service appropriate? Yes; Fluid consistency: Thin  Diet effective now       ?  ? ?  ?  ? ?  ? ? ?EDUCATION NEEDS:  ? ?Education needs have been addressed ? ?Skin:  Skin Assessment: Skin Integrity Issues: ?Skin Integrity Issues:: Other (Comment) ?Other: open wound to rt heel ? ?Last  BM:  09/13/21 ? ?Height:  ? ?Ht Readings from Last 1 Encounters:  ?09/12/21 5\' 7"  (1.702 m)  ? ? ?Weight:  ? ?Wt Readings from Last 1 Encounters:  ?09/14/21 71 kg  ? ? ?Ideal Body Weight:  67.2 kg ? ?BMI:  Body mass index is 24.52 kg/m?. ? ?Estimated Nutritional Needs:  ? ?Kcal:  1900-2200kcal/day ? ?Protein:  95-110g/day ? ?Fluid:  1.6-1.9L/day ? ? ? ?Loistine Chance, RD, LDN, CDCES ?Registered Dietitian II ?Certified Diabetes Care and Education Specialist ?Please refer to [facility] for RD and/or RD on-call/weekend/after hours pager  ?

## 2021-09-14 NOTE — Evaluation (Signed)
Occupational Therapy Evaluation ?Patient Details ?Name: Jeremiah [name] ?MRN: [id number] ?DOB: 1950/11/27 ?Today's Date: 09/14/2021 ? ? ?History of Present Illness 71 year old male with history of tobacco dependence, depression, anxiety, COPD, hypertension, GERD, chronic respiratory failure on 4-5L O2, who was recently discharged to SNF, returns with breathing issues - thoracentesis 5/9.  ? ?Clinical Impression ?  ?Patient presenting with decreased Ind in self care, balance, functional mobility/transfers, endurance, and safety awareness. Patient reports living with others who assist him with IADL tasks. Pt has multiple tears and bruising all over with pt reporting multiple falls. Pt is very anxious with movement and asking for O2 to assess O2 levels throughout. Pt on 4Ls via [place] at baseline.  Pt is very self limiting and asking therapist to wash his face when he is able to perform task with set up A. He does have limited shoulder elevation ROM but unsure if this in new or from frequent falls. Pt refuses EOB activity this session even with encouragement and states, " You aren't going to make me tired today." Patient will benefit from acute OT to increase overall independence in the areas of ADLs, functional mobility,and safety awareness in order to safely discharge to next venue of care.  ?   ? ?Recommendations for follow up therapy are one component of a multi-disciplinary discharge planning process, led by the attending physician.  Recommendations may be updated based on patient status, additional functional criteria and insurance authorization.  ? ?Follow Up Recommendations ? Skilled nursing-short term rehab (<3 hours/day)  ?  ?Assistance Recommended at Discharge Frequent or constant Supervision/Assistance  ?Patient can return home with the following A lot of help with walking and/or transfers;A lot of help with bathing/dressing/bathroom;Assistance with cooking/housework;Help with stairs or ramp for  entrance;Assist for transportation ? ?  ?Functional Status Assessment ? Patient has had a recent decline in their functional status and demonstrates the ability to make significant improvements in function in a reasonable and predictable amount of time.  ?Equipment Recommendations ? Other (comment) (defer to next venue of care)  ?  ?   ?Precautions / Restrictions Precautions ?Precautions: Fall ?Restrictions ?Weight Bearing Restrictions: No  ? ?  ? ?Mobility Bed Mobility ?Overal bed mobility: Needs Assistance ?Bed Mobility: Rolling ?Rolling: Mod assist ?  ?  ?  ?  ?General bed mobility comments: Pt is very resistive to movement this session ?  ? ?Transfers ?  ?  ?  ?  ?  ?  ?  ?  ?  ?General transfer comment: Pt refusal ?  ? ?  ?   ? ?ADL either performed or assessed with clinical judgement  ? ?ADL Overall ADL's : Needs assistance/impaired ?  ?  ?Grooming: Wash/dry hands;Wash/dry face;Set up;Supervision/safety ?  ?  ?  ?  ?  ?  ?  ?  ?  ?  ?  ?  ?  ?  ?  ?  ?General ADL Comments: Pt is very anxious throughtout session and quick to refuse activity during session. Pt likely needing max A for LB self care. Set up A for grooming tasks at bed level this session.  ? ? ? ?Vision Patient Visual Report: No change from baseline ?   ?   ?   ?   ? ?Pertinent Vitals/Pain Pain Assessment ?Pain Assessment: No/denies pain  ? ? ? ?Hand Dominance Right ?  ?Extremity/Trunk Assessment Upper Extremity Assessment ?Upper Extremity Assessment: Generalized weakness (decreased shoulder elevation) ?  ?Lower Extremity Assessment ?Lower Extremity Assessment: Generalized  weakness ?  ?  ?  ?Communication Communication ?Communication: No difficulties ?  ?Cognition Arousal/Alertness: Awake/alert ?Behavior During Therapy: Anxious, Flat affect ?Overall Cognitive Status: Within Functional Limits for tasks assessed ?  ?  ?  ?  ?  ?  ?  ?  ?  ?  ?  ?  ?  ?  ?  ?  ?General Comments: highly anxious and needing extra cuing to simply participate ?  ?  ?   ?    ?   ? ? ?Home Living Family/patient expects to be discharged to:: Skilled nursing facility ?Living Arrangements: Non-relatives/Friends ?Available Help at Discharge: Friend(s);Available PRN/intermittently ?Type of Home: Mobile home ?Home Access: Stairs to enter ?Entrance Stairs-Number of Steps: 10 at front with b/l (wide) rails ?Entrance Stairs-Rails: Right;Left ?Home Layout: One level ?  ?  ?Bathroom Shower/Tub: Walk-in shower;Sponge bathes at baseline ?  ?Bathroom Toilet: Standard ?  ?  ?Home Equipment: BSC/3in1;Rolling Walker (2 wheels) ?  ?  ?  ? ?  ?Prior Functioning/Environment Prior Level of Function : Needs assist ?  ?  ?  ?  ?  ?  ?Mobility Comments: Uses RW at home, [facility] in community (health care appts). Reports rarely leaves home ?ADLs Comments: Sponge bathes at baseline. Uses BSC over toilet, 4L O2 at baseline. ?  ? ?  ?  ?OT Problem List: Decreased strength;Decreased activity tolerance;Cardiopulmonary status limiting activity;Impaired balance (sitting and/or standing);Pain;Decreased safety awareness;Decreased knowledge of use of DME or AE ?  ?   ?OT Treatment/Interventions: Self-care/ADL training;Therapeutic exercise;Patient/family education;Balance training;Energy conservation;Therapeutic activities;DME and/or AE instruction  ?  ?OT Goals(Current goals can be found in the care plan section) Acute Rehab OT Goals ?Patient Stated Goal: to improve breathing and feel better ?OT Goal Formulation: With patient ?Time For Goal Achievement: 09/28/21 ?Potential to Achieve Goals: Good ?ADL Goals ?Pt Will Perform Grooming: with supervision;standing ?Pt Will Perform Lower Body Dressing: with supervision;sit to/from stand ?Pt Will Transfer to Toilet: with supervision;ambulating ?Pt Will Perform Toileting - Clothing Manipulation and hygiene: with supervision;sit to/from stand  ?OT Frequency: Min 2X/week ?  ? ?   ?AM-PAC OT "6 Clicks" Daily Activity     ?Outcome Measure Help from another person eating meals?: A  Little ?Help from another person taking care of personal grooming?: A Lot ?Help from another person toileting, which includes using toliet, bedpan, or urinal?: A Lot ?Help from another person bathing (including washing, rinsing, drying)?: A Lot ?Help from another person to put on and taking off regular upper body clothing?: A Lot ?Help from another person to put on and taking off regular lower body clothing?: A Lot ?6 Click Score: 13 ?  ?End of Session Equipment Utilized During Treatment: Rolling walker (2 wheels) ? ?Activity Tolerance: Other (comment) (pt is anxious and self limiting) ?Patient left: in bed;with call bell/phone within reach;with bed alarm set ? ?OT Visit Diagnosis: Unsteadiness on feet (R26.81);Muscle weakness (generalized) (M62.81)  ?              ?Time: 6553-7482 ?OT Time Calculation (min): 24 min ?Charges:  OT General Charges ?$OT Visit: 1 Visit ?OT Evaluation ?$OT Eval Moderate Complexity: 1 Mod ?OT Treatments ?$Self Care/Home Management : 8-22 mins ? ?Darleen Crocker, MS, OTR/L , CBIS ?ascom [phone]  ?09/14/21, 2:42 PM  ?

## 2021-09-14 NOTE — Final Consult Note (Signed)
PHARMACY CONSULT NOTE - FOLLOW UP ? ?Pharmacy Consult for Electrolyte Monitoring and Replacement  ? ?Recent Labs: ?Potassium (mmol/L)  ?Date Value  ?09/14/2021 4.0  ?10/02/2013 4.4  ? ?Magnesium (mg/dL)  ?Date Value  ?09/14/2021 1.8  ? ?Calcium (mg/dL)  ?Date Value  ?09/14/2021 7.9 (L)  ? ?Calcium, Total (mg/dL)  ?Date Value  ?10/02/2013 9.1  ? ?Albumin (g/dL)  ?Date Value  ?09/12/2021 3.1 (L)  ?10/01/2013 3.3 (L)  ? ?Phosphorus (mg/dL)  ?Date Value  ?09/14/2021 1.9 (L)  ? ?Sodium (mmol/L)  ?Date Value  ?09/14/2021 133 (L)  ?10/02/2013 136  ? ? ?Assessment: ?70yo M w/ h/o smoking daily, severe COPD/emphysema, and  chronic hypoxemic respiratory failure who is admitted with acute on chronic respiratory failure due to progressive right pleural effusion,c/f PNA and possible COPDE. Pharmacy consulted for electrolyte mgmt. ? ?Goal of Therapy:  ?Lytes WNL ? ?Plan:  ?Pt transferred from CCU > step down. Will discontinue phase of care specific electrolyte consult. Please re-consult pharmacy if further management desired. ? ?Na: 132>133; K 3.9>4.0; Phos 2.2>1.9 ?Re-ordered NaPhos 84mmol IV x1 per provider ? ?Lorna Dibble ,PharmD ?Clinical Pharmacist ?09/14/2021 9:56 AM ? ?

## 2021-09-14 NOTE — [place] FL2 (Signed)
?[place] MEDICAID FL2 LEVEL OF CARE SCREENING TOOL  ?  ? ?IDENTIFICATION  ?Patient Name: ?Jeremiah [name] Birthdate: Oct 01, 1950 Sex: male Admission Date (Current Location): ?09/12/2021  ?[place] and [place] Number: ? [name] ?  Facility and Address:  ?[facility], [address], [place], [place] [address] ?     Provider Number: ?[id number]  ?Attending Physician Name and Address:  ?Val Riles, MD ? Relative Name and Phone Number:  ?[name] [phone] ?   ?Current Level of Care: ?Hospital Recommended Level of Care: ?[place] Prior Approval Number: ?  ? ?Date Approved/Denied: ?  PASRR Number: ?[id number] A ? ?Discharge Plan: ?SNF ?  ? ?Current Diagnoses: ?Patient Active Problem List  ? Diagnosis Date Noted  ? Acute on chronic respiratory failure ([place]) 09/12/2021  ? Insomnia 09/02/2021  ? Hypotension 09/01/2021  ? Left lower lobe pulmonary nodule 08/26/2021  ? Depression 08/26/2021  ? Sepsis due to pneumonia ([place]) 08/26/2021  ? Pleural effusion on right 08/26/2021  ? Chronic diastolic CHF (congestive heart failure) ([place]) 07/01/2021  ? Chronic hypoxemic respiratory failure ([place]) 07/01/2021  ? CAP (community acquired pneumonia) 06/30/2021  ? COPD with acute exacerbation ([place]) 06/30/2021  ? Thrush, oral 06/16/2021  ? Unintentional weight loss 06/16/2021  ? Pressure injury of skin 06/16/2021  ? Protein-calorie malnutrition, severe 06/16/2021  ? Dehydration   ? Syncope 06/15/2021  ? GERD (gastroesophageal reflux disease) 05/28/2021  ? Acute respiratory failure with hypoxia ([place]) 05/28/2021  ? Anemia 10/18/2020  ? COPD exacerbation ([place]) 05/14/2020  ? Nicotine dependence 05/14/2020  ? Hyponatremia 05/14/2020  ? HTN (hypertension) 05/14/2020  ? Abnormal LFTs 05/14/2020  ? Acute on chronic respiratory failure with hypoxia ([place]) 10/15/2016  ? Tobacco dependency 10/15/2016  ? ? ?Orientation RESPIRATION BLADDER Height & Weight   ?  ?Self, Place ? O2 (4L nasal cannula) Incontinent,  External catheter Weight: 156 lb 8.4 oz (71 kg) ?Height:  5\' 7"  (170.2 cm)  ?BEHAVIORAL SYMPTOMS/MOOD NEUROLOGICAL BOWEL NUTRITION STATUS  ?    Continent Diet (see discharge summary)  ?AMBULATORY STATUS COMMUNICATION OF NEEDS Skin   ?Extensive Assist Verbally Other (Comment) (abrasions on legs and arms, right heel open wound) ?  ?  ?  ?    ?     ?     ? ? ?Personal Care Assistance Level of Assistance  ?Bathing, Feeding, Dressing, Total care Bathing Assistance: Limited assistance ?Feeding assistance: Limited assistance ?Dressing Assistance: Maximum assistance ?Total Care Assistance: Maximum assistance  ? ?Functional Limitations Info  ?Sight, Hearing, Speech Sight Info: Adequate ?Hearing Info: Adequate ?Speech Info: Adequate  ? ? ?SPECIAL CARE FACTORS FREQUENCY  ?PT (By licensed PT), OT (By licensed OT)   ?  ?PT Frequency: min 4x weekly ?OT Frequency: min 4x weekly ?  ?  ?  ?   ? ? ?Contractures Contractures Info: Not present  ? ? ?Additional Factors Info  ?Code Status, Allergies Code Status Info: full ?Allergies Info: Penicillins ?  ?  ?  ?   ? ?Current Medications (09/14/2021):  This is the current hospital active medication list ?Current Facility-Administered Medications  ?Medication Dose Route Frequency Provider Last Rate Last Admin  ? 0.9 %  sodium chloride infusion   Intravenous PRN Freddi Starr, MD 10 mL/hr at 09/12/21 2028 New Bag at 09/12/21 2028  ? acetaminophen (TYLENOL) tablet 650 mg  650 mg Oral Q6H PRN Teressa Lower, NP   650 mg at 09/13/21 1207  ? azithromycin (ZITHROMAX) 500 mg in sodium chloride 0.9 %  250 mL IVPB  500 mg Intravenous Q24H Freddi Starr, MD 250 mL/hr at 09/13/21 1832 500 mg at 09/13/21 1832  ? And  ? azithromycin (ZITHROMAX) 250 mg in dextrose 5 % 125 mL IVPB  250 mg Intravenous Q24H Freda Jackson B, MD      ? budesonide (PULMICORT) nebulizer solution 0.5 mg  0.5 mg Nebulization BID Teressa Lower, NP   0.5 mg at 09/14/21 0350  ? ceFEPIme (MAXIPIME) 2 g in sodium chloride  0.9 % 100 mL IVPB  2 g Intravenous Q8H Virl Cagey E, RPH 200 mL/hr at 09/14/21 0533 2 g at 09/14/21 0533  ? chlorhexidine (PERIDEX) 0.12 % solution 15 mL  15 mL Mouth Rinse BID Freddi Starr, MD   15 mL at 09/12/21 2207  ? Chlorhexidine Gluconate Cloth 2 % PADS 6 each  6 each Topical Q0600 Freddi Starr, MD   6 each at 09/14/21 0535  ? docusate sodium (COLACE) capsule 100 mg  100 mg Oral BID PRN Teressa Lower, NP      ? enoxaparin (LOVENOX) injection 40 mg  40 mg Subcutaneous Q24H Teressa Lower, NP   40 mg at 09/13/21 2157  ? ipratropium-albuterol (DUONEB) 0.5-2.5 (3) MG/3ML nebulizer solution 3 mL  3 mL Nebulization Once Nena Polio, MD      ? ipratropium-albuterol (DUONEB) 0.5-2.5 (3) MG/3ML nebulizer solution 3 mL  3 mL Nebulization Q6H Teressa Lower, NP   3 mL at 09/14/21 0954  ? lactated ringers bolus 1 mL  1 mL Intravenous Once Nena Polio, MD      ? linezolid (ZYVOX) IVPB 600 mg  600 mg Intravenous Q12H Freda Jackson B, MD 300 mL/hr at 09/14/21 1015 600 mg at 09/14/21 1015  ? MEDLINE mouth rinse  15 mL Mouth Rinse q12n4p Freddi Starr, MD   15 mL at 09/13/21 1723  ? methylPREDNISolone sodium succinate (SOLU-MEDROL) 40 mg/mL injection 40 mg  40 mg Intravenous Daily Teressa Lower, NP   40 mg at 09/14/21 1013  ? mupirocin ointment (BACTROBAN) 2 % 1 application.  1 application. Nasal BID Freddi Starr, MD   1 application. at 09/14/21 1059  ? norepinephrine (LEVOPHED) 4mg  in 272mL (0.016 mg/mL) premix infusion  0-10 mcg/min Intravenous Continuous Lorna Dibble, RPH   Stopped at 09/13/21 0600  ? pantoprazole (PROTONIX) injection 40 mg  40 mg Intravenous QHS Teressa Lower, NP   40 mg at 09/13/21 2155  ? polyethylene glycol (MIRALAX / GLYCOLAX) packet 17 g  17 g Oral Daily PRN Teressa Lower, NP      ? sodium phosphate 15 mmol in dextrose 5 % 250 mL infusion  15 mmol Intravenous Once Val Riles, MD 43 mL/hr at 09/14/21 1135 15 mmol at 09/14/21 1135  ? ? ? ?Discharge  Medications: ?Please see discharge summary for a list of discharge medications. ? ?Relevant Imaging Results: ? ?Relevant Lab Results: ? ? ?Additional Information ?SSN: [id number] ? ?Alberteen Sam, LCSW ? ? ? ? ?

## 2021-09-14 NOTE — Progress Notes (Signed)
? ?NAME:  Jeremiah [name], MRN:  [id number], DOB:  1951-03-17, LOS: 2 ?ADMISSION DATE:  09/12/2021, CONSULTATION DATE: 09/12/2021 ?REFERRING MD: Dr, Cinda Quest, CHIEF COMPLAINT:  Shortness of Breath   ? ?History of Present Illness:  ?This is a 71 yo male with a PMH of Severe End-Stage Emphysema who presented to [facility] ER on 05/8 from [facility] via EMS with shortness of breath.  Per ER notes EMS reported pt in severe respiratory distress, therefore he was placed on NRB with O2 sats only increasing to 82%.  He also received duoneb, albuterol, and magnesium sulfate. He was recently hospitalized from 04/21-05/4 with acute on chronic hypoxic/hypercapnic respiratory failure secondary to severe end-stage emphysema; sepsis due to pneumonia; and right sided pleural effusion requiring right thoracentesis 08/29/21 (body fluid culture, legionella culture reflex negative). ? ?ED Course ?In the ER pt transitioned to Bipap due to continued respiratory distress.  CXR revealed large right pleural effusion with significant interval increase and CHF.  Lab results: Na+ 132, chloride 89, CO2 33, glucose 143, BUN 30, calcium 8.7, albumin 3.1, troponin 40, wbc 25.5, and hgb 11.2.  ABG: pH 7.23/pCO2 88/pO2 56/acid-base excess 6.1/bicarb 36.9.  Pt also hypotensive and tachycardic, sepsis protocol initiated.  Pt received 2L LR bolus, ceftriaxone, duoneb x2, and 125 mg iv solumedrol.  Due to continued hypotension levophed gtt initiated.  PCCM team contacted for ICU admission.   ? ?Pertinent  Medical History  ?Asthma ?Severe End-stage Emphysema ?O2 Dependent  ?GERD ?HTN ?Multifocal Pneumonia  ?Sepsis ?Tobacco Abuse  ? ?Significant Hospital Events: ?Including procedures, antibiotic start and stop dates in addition to other pertinent events   ?05/8: Pt admitted to ICU with acute on chronic hypoxic hypercapnic respiratory failure secondary to recurrent right-sided pleural effusion and sepsis secondary to possible pneumonia requiring Bipap and  levophed gtt  ?05/8: Right-sided thoracentesis performed per IR with removal of 1.5L of pleural fluid  ?05/9: Pt off levophed gtt, alert/oriented, on Bipap FiO2 28% will transition off Bipap ? ?Interim History / Subjective:  ? ?No acute events overnight ?Patient had repeat thoracentesis yesterday with 1.3L removed.  ? ?Objective   ?Blood pressure 99/65, pulse 97, temperature 98.2 ?F (36.8 ?C), temperature source Oral, resp. rate 20, height 5\' 7"  (1.702 m), weight 71 kg, SpO2 100 %. ?   ?   ? ?Intake/Output Summary (Last 24 hours) at 09/14/2021 1540 ?Last data filed at 09/14/2021 1100 ?Gross per 24 hour  ?Intake 600 ml  ?Output 1125 ml  ?Net -525 ml  ? ?Filed Weights  ? 09/12/21 1540 09/13/21 0500 09/14/21 0423  ?Weight: 68.5 kg 68.2 kg 71 kg  ? ? ?Examination: ?General: frail cachectic male, no acute distress ?HEENT: sclera anictic, burn wounds on nostrils ?Neuro: following commands, moving all extremities ?CV: rrr, s1s2, no murmurs ?PULM: diminished breath sounds. Scattered rhonchi ?GI: soft, non-tender, non-distended, BS+ ?Extremities: warm, no edema ?Skin: sacral wounds present on admission ? ?Assessment & Plan:  ?Acute on chronic hypoxic hypercapnic respiratory failure secondary to recurrent right-sided pleural effusion and possible pneumonia s/p thoracentesis 05/8 with removal of 1.5L and 5/9 with removal of 1.3L pleural fluid  ?Severe end stage Emphysema  ?Mediastinal and Hilar Adenopathy ?Possible Right Hilar Mass ?Hx: Left lower lobe pulmonary nodule and tobacco abuse  ?- Continue nebulizer treatments ?- Continue IV steroids ?- Prn Bipap for dyspnea and/or hypoxia  ?- IV and nebulized steroids ?- Smoking cessation counseling provided  ?- Continue nicotine patch  ?- CT Chest today shows concern for possible  right perihilar mass. We are awaiting pleural fluid cytology results.  ?- Depending on pleural fluid cytology, he may require placement of pleurX catheter if he has malignant effusion.  ? ?[facility]  Pulmonary team to continue to follow.  ? ? ?Best Practice (right click and "Reselect all SmartList Selections" daily)  ? ?Per primary team ? ?Labs   ?CBC: ?Recent Labs  ?Lab 09/08/21 ?0503 09/12/21 ?1123 09/13/21 ?7989 09/14/21 ?2119  ?WBC 20.6* 25.5* 24.2* 19.6*  ?NEUTROABS  --  23.4*  --  17.2*  ?HGB 10.9* 11.2* 9.2* 8.5*  ?HCT 34.6* 35.5* 29.0* 25.9*  ?MCV 87.4 87.4 85.0 82.2  ?PLT 249 293 268 277  ? ? ?Basic Metabolic Panel: ?Recent Labs  ?Lab 09/08/21 ?0503 09/12/21 ?1123 09/13/21 ?4174 09/14/21 ?0814  ?NA 138 132* 132* 133*  ?K 4.4 4.1 3.9 4.0  ?CL 94* 89* 93* 93*  ?CO2 40* 33* 35* 34*  ?GLUCOSE 89 143* 112* 80  ?BUN 30* 30* 32* 27*  ?CREATININE 0.56* 0.63 0.62 0.59*  ?CALCIUM 8.9 8.7* 8.4* 7.9*  ?MG 2.0 1.9 1.9 1.8  ?PHOS  --  4.1 2.2* 1.9*  ? ?GFR: ?Estimated Creatinine Clearance: 79.2 mL/min (A) (by C-G formula based on SCr of 0.59 mg/dL (L)). ?Recent Labs  ?Lab 09/08/21 ?0503 09/12/21 ?1123 09/12/21 ?1255 09/12/21 ?1943 09/13/21 ?4818 09/14/21 ?5631  ?PROCALCITON  --   --  0.11  --  0.13 <0.10  ?WBC 20.6* 25.5*  --   --  24.2* 19.6*  ?LATICACIDVEN  --  1.4  --  2.2*  --   --   ? ? ?Liver Function Tests: ?Recent Labs  ?Lab 09/12/21 ?1123  ?AST 28  ?ALT 30  ?ALKPHOS 102  ?BILITOT 0.9  ?PROT 6.3*  ?ALBUMIN 3.1*  ? ?No results for input(s): LIPASE, AMYLASE in the last 168 hours. ?No results for input(s): AMMONIA in the last 168 hours. ? ?ABG ?   ?Component Value Date/Time  ? PHART 7.48 (H) 09/13/2021 0500  ? PCO2ART 50 (H) 09/13/2021 0500  ? PO2ART 55 (L) 09/13/2021 0500  ? HCO3 37.2 (H) 09/13/2021 0500  ? ACIDBASEDEF 1.2 06/09/2020 2326  ? O2SAT 88.6 09/13/2021 0500  ?  ? ?Coagulation Profile: ?Recent Labs  ?Lab 09/12/21 ?1943  ?INR 1.0  ? ? ?Cardiac Enzymes: ?No results for input(s): CKTOTAL, CKMB, CKMBINDEX, TROPONINI in the last 168 hours. ? ?HbA1C: ?No results found for: HGBA1C ? ?CBG: ?Recent Labs  ?Lab 09/12/21 ?1543  ?GLUCAP 155*  ? ?Critical care time: n/a ?  ? ?Freda Jackson, MD ?[facility]monary  & Critical [facility] ?Office: [phone] ? ? ?See Amion for personal pager ?PCCM on call pager [phone] until 7pm. ?Please call Elink 7p-7a. [phone] ? ? ? ?

## 2021-09-15 ENCOUNTER — Inpatient Hospital Stay: Payer: Medicare HMO

## 2021-09-15 ENCOUNTER — Encounter: Payer: Self-pay | Admitting: Pulmonary Disease

## 2021-09-15 DIAGNOSIS — J9601 Acute respiratory failure with hypoxia: Secondary | ICD-10-CM | POA: Diagnosis not present

## 2021-09-15 DIAGNOSIS — J9621 Acute and chronic respiratory failure with hypoxia: Secondary | ICD-10-CM | POA: Diagnosis not present

## 2021-09-15 DIAGNOSIS — Z7189 Other specified counseling: Secondary | ICD-10-CM

## 2021-09-15 DIAGNOSIS — J910 Malignant pleural effusion: Secondary | ICD-10-CM

## 2021-09-15 DIAGNOSIS — J9622 Acute and chronic respiratory failure with hypercapnia: Secondary | ICD-10-CM | POA: Diagnosis not present

## 2021-09-15 LAB — CBC
HCT: 25.6 % — ABNORMAL LOW (ref 39.0–52.0)
Hemoglobin: 8.5 g/dL — ABNORMAL LOW (ref 13.0–17.0)
MCH: 27.8 pg (ref 26.0–34.0)
MCHC: 33.2 g/dL (ref 30.0–36.0)
MCV: 83.7 fL (ref 80.0–100.0)
Platelets: 286 10*3/uL (ref 150–400)
RBC: 3.06 MIL/uL — ABNORMAL LOW (ref 4.22–5.81)
RDW: 16.3 % — ABNORMAL HIGH (ref 11.5–15.5)
WBC: 16.4 10*3/uL — ABNORMAL HIGH (ref 4.0–10.5)
nRBC: 0 % (ref 0.0–0.2)

## 2021-09-15 LAB — BASIC METABOLIC PANEL
Anion gap: 3 — ABNORMAL LOW (ref 5–15)
BUN: 23 mg/dL (ref 8–23)
CO2: 35 mmol/L — ABNORMAL HIGH (ref 22–32)
Calcium: 7.7 mg/dL — ABNORMAL LOW (ref 8.9–10.3)
Chloride: 93 mmol/L — ABNORMAL LOW (ref 98–111)
Creatinine, Ser: 0.53 mg/dL — ABNORMAL LOW (ref 0.61–1.24)
GFR, Estimated: >60 mL/min (ref >60)
Glucose, Bld: 98 mg/dL (ref 70–99)
Potassium: 3.6 mmol/L (ref 3.5–5.1)
Sodium: 131 mmol/L — ABNORMAL LOW (ref 135–145)

## 2021-09-15 LAB — URINALYSIS, COMPLETE (UACMP) WITH MICROSCOPIC
Bacteria, UA: NONE SEEN
Bilirubin Urine: NEGATIVE
Glucose, UA: NEGATIVE mg/dL
Hgb urine dipstick: NEGATIVE
Ketones, ur: NEGATIVE mg/dL
Nitrite: NEGATIVE
Protein, ur: NEGATIVE mg/dL
Specific Gravity, Urine: 1.016 (ref 1.005–1.030)
pH: 6 (ref 5.0–8.0)

## 2021-09-15 LAB — COMPREHENSIVE METABOLIC PANEL
ALT: 28 U/L (ref 0–44)
AST: 31 U/L (ref 15–41)
Albumin: 2.6 g/dL — ABNORMAL LOW (ref 3.5–5.0)
Alkaline Phosphatase: 88 U/L (ref 38–126)
Anion gap: 4 — ABNORMAL LOW (ref 5–15)
BUN: 20 mg/dL (ref 8–23)
CO2: 35 mmol/L — ABNORMAL HIGH (ref 22–32)
Calcium: 8 mg/dL — ABNORMAL LOW (ref 8.9–10.3)
Chloride: 90 mmol/L — ABNORMAL LOW (ref 98–111)
Creatinine, Ser: 0.57 mg/dL — ABNORMAL LOW (ref 0.61–1.24)
GFR, Estimated: >60 mL/min (ref >60)
Glucose, Bld: 118 mg/dL — ABNORMAL HIGH (ref 70–99)
Potassium: 3.8 mmol/L (ref 3.5–5.1)
Sodium: 129 mmol/L — ABNORMAL LOW (ref 135–145)
Total Bilirubin: 0.7 mg/dL (ref 0.3–1.2)
Total Protein: 5.5 g/dL — ABNORMAL LOW (ref 6.5–8.1)

## 2021-09-15 LAB — OSMOLALITY: Osmolality: 274 mOsm/kg — ABNORMAL LOW (ref 275–295)

## 2021-09-15 LAB — BODY FLUID CULTURE W GRAM STAIN: Culture: NO GROWTH

## 2021-09-15 LAB — VITAMIN D 25 HYDROXY (VIT D DEFICIENCY, FRACTURES): Vit D, 25-Hydroxy: 20.65 ng/mL — ABNORMAL LOW (ref 30–100)

## 2021-09-15 LAB — LIPASE, BLOOD: Lipase: 42 U/L (ref 11–51)

## 2021-09-15 LAB — CYTOLOGY - NON PAP

## 2021-09-15 LAB — MAGNESIUM: Magnesium: 1.8 mg/dL (ref 1.7–2.4)

## 2021-09-15 LAB — RESP PANEL BY RT-PCR (FLU A&B, COVID) ARPGX2
Influenza A by PCR: NEGATIVE
Influenza B by PCR: NEGATIVE
SARS Coronavirus 2 by RT PCR: NEGATIVE

## 2021-09-15 LAB — PHOSPHORUS: Phosphorus: 2.7 mg/dL (ref 2.5–4.6)

## 2021-09-15 MED ORDER — PREDNISONE 20 MG PO TABS
20.0000 mg | ORAL_TABLET | Freq: Every day | ORAL | Status: AC
Start: 2021-09-17 — End: 2021-09-17
  Administered 2021-09-17: 20 mg via ORAL
  Filled 2021-09-15: qty 1

## 2021-09-15 MED ORDER — PREDNISONE 10 MG PO TABS
10.0000 mg | ORAL_TABLET | Freq: Every day | ORAL | Status: AC
  Administered 2021-09-18: 10 mg via ORAL
  Filled 2021-09-15: qty 1

## 2021-09-15 MED ORDER — VITAMIN D (ERGOCALCIFEROL) 1.25 MG (50000 UNIT) PO CAPS
50000.0000 [IU] | ORAL_CAPSULE | ORAL | Status: DC
  Administered 2021-09-15: 50000 [IU] via ORAL
  Filled 2021-09-15: qty 1

## 2021-09-15 MED ORDER — SULFAMETHOXAZOLE-TRIMETHOPRIM 400-80 MG PO TABS
1.0000 | ORAL_TABLET | Freq: Two times a day (BID) | ORAL | Status: DC
  Administered 2021-09-15 – 2021-09-19 (×9): 1 via ORAL
  Filled 2021-09-15 (×9): qty 1

## 2021-09-15 MED ORDER — HYDROCODONE BIT-HOMATROP MBR 5-1.5 MG/5ML PO SOLN
5.0000 mL | Freq: Four times a day (QID) | ORAL | Status: DC | PRN
  Administered 2021-09-15 – 2021-09-20 (×7): 5 mL via ORAL
  Filled 2021-09-15 (×8): qty 5

## 2021-09-15 MED ORDER — FUROSEMIDE 10 MG/ML IJ SOLN
40.0000 mg | Freq: Two times a day (BID) | INTRAMUSCULAR | Status: DC
  Administered 2021-09-15 – 2021-09-21 (×12): 40 mg via INTRAVENOUS
  Filled 2021-09-15 (×12): qty 4

## 2021-09-15 MED ORDER — PREDNISONE 20 MG PO TABS
30.0000 mg | ORAL_TABLET | Freq: Every day | ORAL | Status: AC
  Administered 2021-09-16: 30 mg via ORAL
  Filled 2021-09-15: qty 1

## 2021-09-15 MED ORDER — GUAIFENESIN ER 600 MG PO TB12
600.0000 mg | ORAL_TABLET | Freq: Two times a day (BID) | ORAL | Status: DC
  Administered 2021-09-15 – 2021-09-21 (×13): 600 mg via ORAL
  Filled 2021-09-15 (×13): qty 1

## 2021-09-15 NOTE — Consult Note (Signed)
[place] ?CONSULT NOTE ? ?Patient Care Team: ?[facility] as PCP - General ? ?CHIEF COMPLAINTS/PURPOSE OF CONSULTATION: Adenocarcinoma. ? ?HISTORY OF PRESENTING ILLNESS:  ?Jeremiah [name] 71 y.o.  male with a history of severe end-stage COPD on chronic oxygen noted to have worsening shortness of breath.  Further imaging showed right-sided pleural effusion needing thoracentesis.  Patient was initially hypotensive/tachycardic.  Patient wasvtreated with antibiotics Solu-Medrol and also needed Levophed drip/admission to the ICU. ? ? ?Of note patient was recently discharged in the hospital-was discharged in the hospital after acute on chronic hypercapnic respiratory failure secondary to emphysema/sepsis/pleural effusion.  ? 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 ? ?Review of Systems  ?Constitutional:  Positive for malaise/fatigue and weight loss. Negative for chills, diaphoresis and fever.  ?HENT:  Negative for nosebleeds and sore throat.   ?Eyes:  Negative for double vision.  ?Respiratory:  Positive for cough and sputum production. Negative for hemoptysis, shortness of breath and wheezing.   ?Cardiovascular:  Positive for chest pain. Negative for palpitations, orthopnea and leg swelling.  ?Gastrointestinal:  Positive for nausea. Negative for abdominal pain, blood in stool, constipation, diarrhea, heartburn, melena and vomiting.  ?Genitourinary:  Negative for dysuria, frequency and urgency.  ?Musculoskeletal:  Positive for back pain and joint pain.  ?Skin: Negative.  Negative for itching and rash.  ?Neurological:  Negative for dizziness, tingling, focal weakness, weakness and headaches.  ?Endo/Heme/Allergies:  Does not bruise/bleed easily.  ?Psychiatric/Behavioral:  Negative for depression. The patient is not nervous/anxious and does not have insomnia.    ? ?MEDICAL HISTORY:  ?Past Medical History:  ?Diagnosis Date  ? Acute and  chronic respiratory failure (acute-on-chronic) ([place]) 10/18/2020  ? Asthma   ? COPD (chronic obstructive pulmonary disease) ([place])   ? O2 dependent - 2L  ? GERD (gastroesophageal reflux disease)   ? HTN (hypertension)   ? Multifocal pneumonia 06/16/2020  ? Other emphysema ([place]) 08/22/2016  ? Oxygen dependent   ? Sepsis ([place]) 05/14/2020  ? Tobacco dependence   ? ? ?SURGICAL HISTORY: ?Past Surgical History:  ?Procedure Laterality Date  ? CATARACT EXTRACTION W/PHACO Left 12/09/2019  ? Procedure: CATARACT EXTRACTION PHACO AND INTRAOCULAR LENS PLACEMENT (IOC) COMPLICATED LEFT 56.97 94:80.1;  Surgeon: Birder Robson, MD;  Location: [place];  Service: Ophthalmology;  Laterality: Left;  [place]  ? CATARACT EXTRACTION W/PHACO Right 01/27/2020  ? Procedure: CATARACT EXTRACTION PHACO AND INTRAOCULAR LENS PLACEMENT ([place]) RIGHT VISION BLUE 26.83 02:07.6;  Surgeon: Birder Robson, MD;  Location: [place];  Service: Ophthalmology;  Laterality: Right;  ? HERNIA REPAIR    ? IR THORACENTESIS ASP PLEURAL SPACE W/IMG GUIDE  08/29/2021  ? NECK SURGERY    ? ? ?SOCIAL HISTORY: ?Social History  ? ?Socioeconomic History  ? Marital status: Divorced  ?  Spouse name: Not on file  ? Number of children: Not on file  ? Years of education: Not on file  ? Highest education level: Not on file  ?Occupational History  ? Not on file  ?Tobacco Use  ? Smoking status: Every Day  ?  Packs/day: 0.50  ?  Years: 50.00  ?  Pack years: 25.00  ?  Types: Cigarettes  ?  Last attempt to quit: 07/28/2015  ?  Years since quitting: 6.1  ? Smokeless tobacco: Never  ? Tobacco comments:  ?  started age 66.  ?Vaping Use  ? Vaping Use: Never used  ?Substance and Sexual Activity  ?  Alcohol use: Yes  ?  Alcohol/week: 0.0 standard drinks  ?  Comment: beers occasionally  ? Drug use: No  ? Sexual activity: Not on file  ?Other Topics Concern  ? Not on file  ?Social History Narrative  ? Lives at home with friend; anette martin/ her boy friend. Smoker;  no alcohol. Used to works in [employer]. Goes arouund with walker/at home most of the times. Daughter- not in touch.   ? ?Social Determinants of Health  ? ?Financial Resource Strain: Not on file  ?Food Insecurity: Not on file  ?Transportation Needs: Not on file  ?Physical Activity: Not on file  ?Stress: Not on file  ?Social Connections: Not on file  ?Intimate Partner Violence: Not on file  ? ? ?FAMILY HISTORY: ?Family History  ?Problem Relation Age of Onset  ? Cancer - Colon Father   ? Congestive Heart Failure Mother   ? ? ?ALLERGIES:  is allergic to penicillins. ? ?MEDICATIONS:  ?Current Facility-Administered Medications  ?Medication Dose Route Frequency Provider Last Rate Last Admin  ? 0.9 %  sodium chloride infusion   Intravenous PRN Freddi Starr, MD 10 mL/hr at 09/12/21 2028 New Bag at 09/12/21 2028  ? acetaminophen (TYLENOL) tablet 650 mg  650 mg Oral Q6H PRN Teressa Lower, NP   650 mg at 09/15/21 1940  ? ascorbic acid (VITAMIN C) tablet 500 mg  500 mg Oral BID Val Riles, MD   500 mg at 09/15/21 1308  ? budesonide (PULMICORT) nebulizer solution 0.5 mg  0.5 mg Nebulization BID Teressa Lower, NP   0.5 mg at 09/15/21 0815  ? chlorhexidine (PERIDEX) 0.12 % solution 15 mL  15 mL Mouth Rinse BID Freddi Starr, MD   15 mL at 09/15/21 0924  ? Chlorhexidine Gluconate Cloth 2 % PADS 6 each  6 each Topical Q0600 Freddi Starr, MD   6 each at 09/14/21 0535  ? docusate sodium (COLACE) capsule 100 mg  100 mg Oral BID PRN Teressa Lower, NP      ? enoxaparin (LOVENOX) injection 40 mg  40 mg Subcutaneous Q24H Teressa Lower, NP   40 mg at 09/14/21 2030  ? feeding supplement (ENSURE ENLIVE / ENSURE PLUS) liquid 237 mL  237 mL Oral TID BM Val Riles, MD   237 mL at 09/15/21 1302  ? furosemide (LASIX) injection 40 mg  40 mg Intravenous BID Val Riles, MD   40 mg at 09/15/21 1604  ? guaiFENesin (MUCINEX) 12 hr tablet 600 mg  600 mg Oral BID Val Riles, MD   600 mg at 09/15/21 6578  ? HYDROcodone  bit-homatropine (HYCODAN) 5-1.5 MG/5ML syrup 5 mL  5 mL Oral Q6H PRN Val Riles, MD   5 mL at 09/15/21 1603  ? ipratropium-albuterol (DUONEB) 0.5-2.5 (3) MG/3ML nebulizer solution 3 mL  3 mL Nebulization Once Nena Polio, MD      ? ipratropium-albuterol (DUONEB) 0.5-2.5 (3) MG/3ML nebulizer solution 3 mL  3 mL Nebulization Q6H Teressa Lower, NP   3 mL at 09/15/21 1302  ? MEDLINE mouth rinse  15 mL Mouth Rinse q12n4p Freddi Starr, MD   15 mL at 09/15/21 1607  ? multivitamin with minerals tablet 1 tablet  1 tablet Oral Daily Val Riles, MD   1 tablet at 09/15/21 0924  ? mupirocin ointment (BACTROBAN) 2 % 1 application.  1 application. Nasal BID Freddi Starr, MD   1 application. at 09/15/21 0935  ? pantoprazole (PROTONIX) injection  40 mg  40 mg Intravenous QHS Teressa Lower, NP   40 mg at 09/14/21 2030  ? polyethylene glycol (MIRALAX / GLYCOLAX) packet 17 g  17 g Oral Daily PRN Teressa Lower, NP      ? Derrill Memo ON 09/16/2021] predniSONE (DELTASONE) tablet 30 mg  30 mg Oral Q breakfast Val Riles, MD      ? Followed by  ? Derrill Memo ON 09/17/2021] predniSONE (DELTASONE) tablet 20 mg  20 mg Oral Q breakfast Val Riles, MD      ? Followed by  ? Derrill Memo ON 09/18/2021] predniSONE (DELTASONE) tablet 10 mg  10 mg Oral Q breakfast Val Riles, MD      ? sulfamethoxazole-trimethoprim (BACTRIM) 400-80 MG per tablet 1 tablet  1 tablet Oral Q12H Ottie Glazier, MD   1 tablet at 09/15/21 1605  ? Vitamin D (Ergocalciferol) (DRISDOL) capsule 50,000 Units  50,000 Units Oral Q7 days Val Riles, MD   50,000 Units at 09/15/21 1605  ? zinc sulfate capsule 220 mg  220 mg Oral Daily Val Riles, MD   220 mg at 09/15/21 1031  ? ? ?  ?. ? ?PHYSICAL EXAMINATION: ? ?Vitals:  ? 09/15/21 1544 09/15/21 1939  ?BP: 116/69 110/64  ?Pulse: 99 97  ?Resp: 18 20  ?Temp: 97.9 ?F (36.6 ?C) 97.9 ?F (36.6 ?C)  ?SpO2: 100% 100%  ? ?Filed Weights  ? 09/13/21 0500 09/14/21 0423 09/15/21 0617  ?Weight: 150 lb 5.7 oz (68.2 kg) 156 lb  8.4 oz (71 kg) 165 lb 5.5 oz (75 kg)  ? ?Alone.  Using nasal cannula 4 L.. ? ?Physical Exam ?Vitals and nursing note reviewed.  ?HENT:  ?   Head: Normocephalic and atraumatic.  ?   Mouth/Throat:  ?   Pharynx: Oroph

## 2021-09-15 NOTE — Consult Note (Addendum)
Consultation Note Date: 09/15/2021   Patient Name: Jeremiah [name]  DOB: 1951-04-03  MRN: [id number]  Age / Sex: 71 y.o., male  PCP: [place] Referring Physician: Val Riles, MD  Reason for Consultation: Establishing goals of care  HPI/Patient Profile: 71 yo male with a PMH of Severe End-Stage Emphysema who presented to [facility] ER on 05/8 from [facility] via EMS with shortness of breath.  Per ER notes EMS reported pt in severe respiratory distress, therefore he was placed on NRB with O2 sats only increasing to 82%.  He also received duoneb, albuterol, and magnesium sulfate. He was recently hospitalized from 04/21-05/4 with acute on chronic hypoxic/hypercapnic respiratory failure secondary to severe end-stage emphysema; sepsis due to pneumonia; and right sided pleural effusion requiring right thoracentesis 08/29/21 (body fluid culture, legionella culture reflex negative).  Clinical Assessment and Goals of Care: Notes and labs reviewed. Patient is sitting in bed. No family at bedside. He tells me he lives with his good friend [name]. He states he has a daughter from a previous marriage but would not want her to be surrogate decision maker; he states he would want [name] to be his surrogate Media planner.   He states he turned his driver's license in around a year ago. He is on 4 lpm of O2 at baseline. He uses a walker or wheelchair.   We discussed his diagnosis, prognosis, GOC, EOL wishes disposition and options.  Created space and opportunity for patient  to explore thoughts and feelings regarding current medical information.   A detailed discussion was had today regarding advanced directives.  Concepts specific to code status, artifical feeding and hydration, IV antibiotics and rehospitalization were discussed.  The difference between an aggressive medical intervention path  and a comfort care path was introduced.  Values and goals of care important to patient and family were attempted to be elicited.  Discussed limitations of medical interventions to prolong quality of life in some situations and discussed the concept of human mortality.  He has been well updated on his status and accurately articulates his status. He states he knows he has cancer. He states he is aware he may not be able to withstand chemo, but wants to speak with his oncologist before making decisions. He states he would want to be placed on the ventilator if needed but would not want tracheostomy. He tells me he would want CPR "for a little bit". He states he will not entertain the thought of the word "hospice" at this time.   SUMMARY OF RECOMMENDATIONS   Recommend outpatient palliative.      Primary Diagnoses: Present on Admission:  Acute on chronic respiratory failure ([place])   I have reviewed the medical record, interviewed the patient and family, and examined the patient. The following aspects are pertinent.  Past Medical History:  Diagnosis Date   Acute and chronic respiratory failure (acute-on-chronic) ([place]) 10/18/2020   Asthma    COPD (chronic obstructive pulmonary disease) ([place])    O2 dependent -  2L   GERD (gastroesophageal reflux disease)    HTN (hypertension)    Multifocal pneumonia 06/16/2020   Other emphysema ([place]) 08/22/2016   Oxygen dependent    Sepsis ([place]) 05/14/2020   Tobacco dependence    Social History   Socioeconomic History   Marital status: Divorced    Spouse name: Not on file   Number of children: Not on file   Years of education: Not on file   Highest education level: Not on file  Occupational History   Not on file  Tobacco Use   Smoking status: Every Day    Packs/day: 0.50    Years: 50.00    Pack years: 25.00    Types: Cigarettes    Last attempt to quit: 07/28/2015    Years since quitting: 6.1   Smokeless tobacco: Never   Tobacco comments:     started age 37.  Vaping Use   Vaping Use: Never used  Substance and Sexual Activity   Alcohol use: Yes    Alcohol/week: 0.0 standard drinks    Comment: beers occasionally   Drug use: No   Sexual activity: Not on file  Other Topics Concern   Not on file  Social History Narrative   Lives at home with wife.   Social Determinants of Health   Financial Resource Strain: Not on file  Food Insecurity: Not on file  Transportation Needs: Not on file  Physical Activity: Not on file  Stress: Not on file  Social Connections: Not on file   Family History  Problem Relation Age of Onset   Cancer - Colon Father    Congestive Heart Failure Mother    Scheduled Meds:  vitamin C  500 mg Oral BID   budesonide (PULMICORT) nebulizer solution  0.5 mg Nebulization BID   chlorhexidine  15 mL Mouth Rinse BID   Chlorhexidine Gluconate Cloth  6 each Topical Q0600   enoxaparin (LOVENOX) injection  40 mg Subcutaneous Q24H   feeding supplement  237 mL Oral TID BM   furosemide  40 mg Intravenous BID   guaiFENesin  600 mg Oral BID   ipratropium-albuterol  3 mL Nebulization Once   ipratropium-albuterol  3 mL Nebulization Q6H   mouth rinse  15 mL Mouth Rinse q12n4p   multivitamin with minerals  1 tablet Oral Daily   mupirocin ointment  1 application. Nasal BID   pantoprazole (PROTONIX) IV  40 mg Intravenous QHS   [START ON 09/16/2021] predniSONE  30 mg Oral Q breakfast   Followed by   Derrill Memo ON 09/17/2021] predniSONE  20 mg Oral Q breakfast   Followed by   Derrill Memo ON 09/18/2021] predniSONE  10 mg Oral Q breakfast   sulfamethoxazole-trimethoprim  1 tablet Oral Q12H   Vitamin D (Ergocalciferol)  50,000 Units Oral Q7 days   zinc sulfate  220 mg Oral Daily   Continuous Infusions:  sodium chloride 10 mL/hr at 09/12/21 2028   PRN Meds:.sodium chloride, acetaminophen, docusate sodium, HYDROcodone bit-homatropine, polyethylene glycol Medications Prior to Admission:  Prior to Admission medications    Medication Sig Start Date End Date Taking? Authorizing Provider  albuterol (VENTOLIN HFA) 108 (90 Base) MCG/ACT inhaler Inhale into the lungs every 6 (six) hours as needed for wheezing or shortness of breath.   Yes [provider]  atorvastatin (LIPITOR) 40 MG tablet Take 40 mg by mouth daily. 07/23/21  Yes [provider]  DULoxetine (CYMBALTA) 30 MG capsule Take 30 mg by mouth daily.   Yes [provider]  ferrous sulfate 325 (65 FE) MG tablet Take 1 tablet (325 mg total) by mouth daily. 07/05/21 09/12/21 Yes Zhang, Danford Bad, MD  INCRUSE ELLIPTA 62.5 MCG/ACT AEPB Inhale 1 puff into the lungs daily. 09/08/21  Yes [provider]  lisinopril (ZESTRIL) 5 MG tablet Take 1 tablet (5 mg total) by mouth daily. 09/08/21  Yes Fritzi Mandes, MD  montelukast (SINGULAIR) 10 MG tablet Take 1 tablet (10 mg total) by mouth at bedtime. 09/30/20  Yes Jennye Boroughs, MD  Multiple Vitamin (MULTIVITAMIN WITH MINERALS) TABS tablet Take 1 tablet by mouth daily. 09/08/21  Yes Fritzi Mandes, MD  nicotine (NICODERM CQ - DOSED IN MG/24 HOURS) 21 mg/24hr patch Place 1 patch (21 mg total) onto the skin daily. 07/05/21  Yes Sharen Hones, MD  pantoprazole (PROTONIX) 20 MG tablet Take 1 tablet (20 mg total) by mouth daily. 09/30/20  Yes Jennye Boroughs, MD  predniSONE (DELTASONE) 20 MG tablet Take 1 tablet (20 mg total) by mouth daily with breakfast. 09/08/21  Yes Fritzi Mandes, MD  tamsulosin (FLOMAX) 0.4 MG CAPS capsule Take 1 capsule (0.4 mg total) by mouth daily. 09/08/21  Yes Fritzi Mandes, MD  acetaminophen (TYLENOL) 325 MG tablet Take 2 tablets (650 mg total) by mouth every 6 (six) hours as needed for mild pain (or Fever >/= 101). 08/06/15   Dustin Flock, MD  BREO ELLIPTA 100-25 MCG/INH AEPB Inhale 1 puff into the lungs daily. 09/30/20   Jennye Boroughs, MD  furosemide (LASIX) 20 MG tablet Take 0.5 tablets (10 mg total) by mouth daily as needed for edema. 06/24/21   Eugenie Filler, MD   guaiFENesin-dextromethorphan (ROBITUSSIN DM) 100-10 MG/5ML syrup Take 5 mLs by mouth every 6 (six) hours as needed for cough.    [provider]  ipratropium-albuterol (DUONEB) 0.5-2.5 (3) MG/3ML SOLN Take 3 mLs by nebulization every 6 (six) hours as needed. 09/30/20   Jennye Boroughs, MD  mupirocin ointment (BACTROBAN) 2 % Apply 1 application topically 2 (two) times daily.    [provider]  QUEtiapine (SEROQUEL) 50 MG tablet Take 1.5 tablets (75 mg total) by mouth at bedtime. 09/08/21   Fritzi Mandes, MD  tiotropium (SPIRIVA) 18 MCG inhalation capsule Place 18 mcg into inhaler and inhale daily.    [provider]   Allergies  Allergen Reactions   Penicillins Anaphylaxis, Swelling and Other (See Comments)    Tolerated Ceftriaxone 06/2020 05/2020 childhood reaction and his mother told him that he "swelled up."  Had a PCN reaction causing immediate rash, facial/tongue/throat swelling, SOB or lightheadedness with hypotension: Yes Had a PCN reaction causing severe rash involving mucus membranes or skin necrosis: No Had a PCN reaction that required hospitalization No Had a PCN reaction occurring within the last 10 years: No If all the above answers are "NO", may proceed with Cephalosporin use.   Review of Systems  All other systems reviewed and are negative.  Physical Exam Pulmonary:     Effort: Pulmonary effort is normal.  Skin:    General: Skin is warm and dry.  Neurological:     Mental Status: He is alert.    Vital Signs: BP 109/62 (BP Location: Right Arm)   Pulse 93   Temp 97.7 F (36.5 C) (Oral)   Resp 16   Ht 5\' 7"  (1.702 m)   Wt 75 kg   SpO2 97%   BMI 25.90 kg/m  Pain Scale: 0-10 POSS *See Group Information*: 1-Acceptable,Awake and alert Pain Score: 8    SpO2:  SpO2: 97 % O2 Device:SpO2: 97 % O2 Flow Rate: .O2 Flow Rate (L/min): 4 L/min  IO: Intake/output summary:  Intake/Output Summary (Last 24 hours) at 09/15/2021 1514 Last data filed at  09/15/2021 5093 Gross per 24 hour  Intake --  Output 300 ml  Net -300 ml    LBM: Last BM Date : 09/15/21 Baseline Weight: Weight: 68 kg Most recent weight: Weight: 75 kg       Signed by: Asencion Gowda, NP   Please contact Palliative Medicine Team phone at [phone] for questions and concerns.  For individual provider: See Shea Evans

## 2021-09-15 NOTE — Progress Notes (Signed)
PT Cancellation Note ? ?Patient Details ?Name: Jeremiah [name] ?MRN: [id number] ?DOB: [birth year] ? ? ?Cancelled Treatment:    Reason Eval/Treat Not Completed: Other (comment) ?Pt wishing to talk with PT but refusing any activity today citing "I got more bad news about my lungs" and states he can't really even think about working with PT today. ? ?Kreg Shropshire, DPT ?09/15/2021, 7:03 PM ?

## 2021-09-15 NOTE — Progress Notes (Signed)
? ?NAME:  Jeremiah [name], MRN:  [id number], DOB:  1950/06/14, LOS: 3 ?ADMISSION DATE:  09/12/2021, CONSULTATION DATE: 09/12/2021 ?REFERRING MD: Dr, Cinda Quest, CHIEF COMPLAINT:  Shortness of Breath   ? ?History of Present Illness:  ?This is a 71 yo male with a PMH of Severe End-Stage Emphysema who presented to [facility] ER on 05/8 from [facility] via EMS with shortness of breath.  Per ER notes EMS reported pt in severe respiratory distress, therefore he was placed on NRB with O2 sats only increasing to 82%.  He also received duoneb, albuterol, and magnesium sulfate. He was recently hospitalized from 04/21-05/4 with acute on chronic hypoxic/hypercapnic respiratory failure secondary to severe end-stage emphysema; sepsis due to pneumonia; and right sided pleural effusion requiring right thoracentesis 08/29/21 (body fluid culture, legionella culture reflex negative). ? ?ED Course ?In the ER pt transitioned to Bipap due to continued respiratory distress.  CXR revealed large right pleural effusion with significant interval increase and CHF.  Lab results: Na+ 132, chloride 89, CO2 33, glucose 143, BUN 30, calcium 8.7, albumin 3.1, troponin 40, wbc 25.5, and hgb 11.2.  ABG: pH 7.23/pCO2 88/pO2 56/acid-base excess 6.1/bicarb 36.9.  Pt also hypotensive and tachycardic, sepsis protocol initiated.  Pt received 2L LR bolus, ceftriaxone, duoneb x2, and 125 mg iv solumedrol.  Due to continued hypotension levophed gtt initiated.  PCCM team contacted for ICU admission.   ? ?09/15/21- I met with patient and family at bedside.  We discussed his pleural effusion and pathology.  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  We reivewed palliative care options vs medical oncology evaluation. He wants to discuss options with oncologist.  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 ?Pertinent  Medical History  ?Asthma ?Severe End-stage Emphysema ?O2 Dependent  ?GERD ?HTN ?Multifocal Pneumonia   ?Sepsis ?Tobacco Abuse  ? ?Significant Hospital Events: ?Including procedures, antibiotic start and stop dates in addition to other pertinent events   ?05/8: Pt admitted to ICU with acute on chronic hypoxic hypercapnic respiratory failure secondary to recurrent right-sided pleural effusion and sepsis secondary to possible pneumonia requiring Bipap and levophed gtt  ?05/8: Right-sided thoracentesis performed per IR with removal of 1.5L of pleural fluid  ?05/9: Pt off levophed gtt, alert/oriented, on Bipap FiO2 28% will transition off Bipap ? ?Interim History / Subjective:  ? ?No acute events overnight ?Patient had repeat thoracentesis yesterday with 1.3L removed.  ? ?Objective   ?Blood pressure 109/62, pulse 93, temperature 97.7 ?F (36.5 ?C), temperature source Oral, resp. rate 16, height _0  (1.702 m), weight 75 kg, SpO2 97 %. ?   ?   ? ?Intake/Output Summary (Last 24 hours) at 09/15/2021 1306 ?Last data filed at 09/15/2021 4098 ?Gross per 24 hour  ?Intake --  ?Output 300 ml  ?Net -300 ml  ? ? ?Filed Weights  ? 09/13/21 0500 09/14/21 0423 09/15/21 0617  ?Weight: 68.2 kg 71 kg 75 kg  ? ? ?Examination: ?General: frail cachectic male, no acute distress ?HEENT: sclera anictic, burn wounds on nostrils ?Neuro: following commands, moving all extremities ?CV: rrr, s1s2, no murmurs ?PULM: diminished breath sounds. Scattered rhonchi ?GI: soft, non-tender, non-distended, BS+ ?Extremities: warm, no edema ?Skin: sacral wounds present on admission ? ?Assessment & Plan:  ?Acute on chronic hypoxic hypercapnic respiratory failure secondary to recurrent right-sided pleural effusion and possible pneumonia s/p thoracentesis 05/8 with removal of 1.5L and 5/9 with removal of 1.3L pleural fluid  ?Severe end stage  Emphysema  ?Mediastinal and Hilar Adenopathy ?Possible Right Hilar Mass ?Hx: Left lower lobe pulmonary nodule and tobacco abuse  ?- Continue nebulizer treatments ?- Continue IV steroids ?- Prn Bipap for dyspnea and/or hypoxia   ?- IV and nebulized steroids ?- Smoking cessation counseling provided  ?- Continue nicotine patch  ?- CT Chest today shows concern for possible right perihilar mass. We are awaiting pleural fluid cytology results.  ?- Depending on pleural fluid cytology, he may require placement of pleurX catheter if he has malignant effusion.  ? ? ?Malignant pleural effusion  ?   - adenocarcinoma stage 4 ?   - goals of care addressed today  ?   - patient wishes to have oncology evaluation.  ? ? ? ?Best Practice (right click and "Reselect all SmartList Selections" daily)  ? ?Per primary team ? ?Labs   ?CBC: ?Recent Labs  ?Lab 09/12/21 ?1123 09/13/21 ?0614 09/14/21 ?3662 09/15/21 ?0355  ?WBC 25.5* 24.2* 19.6* 16.4*  ?NEUTROABS 23.4*  --  17.2*  --   ?HGB 11.2* 9.2* 8.5* 8.5*  ?HCT 35.5* 29.0* 25.9* 25.6*  ?MCV 87.4 85.0 82.2 83.7  ?PLT 293 268 277 286  ? ? ? ?Basic Metabolic Panel: ?Recent Labs  ?Lab 09/12/21 ?1123 09/13/21 ?0614 09/14/21 ?9476 09/15/21 ?0355 09/15/21 ?1110  ?NA 132* 132* 133* 131* 129*  ?K 4.1 3.9 4.0 3.6 3.8  ?CL 89* 93* 93* 93* 90*  ?CO2 33* 35* 34* 35* 35*  ?GLUCOSE 143* 112* 80 98 118*  ?BUN 30* 32* 27* 23 20  ?CREATININE 0.63 0.62 0.59* 0.53* 0.57*  ?CALCIUM 8.7* 8.4* 7.9* 7.7* 8.0*  ?MG 1.9 1.9 1.8 1.8  --   ?PHOS 4.1 2.2* 1.9* 2.7  --   ? ? ?GFR: ?Estimated Creatinine Clearance: 79.2 mL/min (A) (by C-G formula based on SCr of 0.57 mg/dL (L)). ?Recent Labs  ?Lab 09/12/21 ?1123 09/12/21 ?1255 09/12/21 ?1943 09/13/21 ?5465 09/14/21 ?0354 09/15/21 ?0355  ?PROCALCITON  --  0.11  --  0.13 <0.10  --   ?WBC 25.5*  --   --  24.2* 19.6* 16.4*  ?LATICACIDVEN 1.4  --  2.2*  --   --   --   ? ? ? ?Liver Function Tests: ?Recent Labs  ?Lab 09/12/21 ?1123 09/15/21 ?1110  ?AST 28 31  ?ALT 30 28  ?ALKPHOS 102 88  ?BILITOT 0.9 0.7  ?PROT 6.3* 5.5*  ?ALBUMIN 3.1* 2.6*  ? ? ?Recent Labs  ?Lab 09/15/21 ?1110  ?LIPASE 42  ? ?No results for input(s): AMMONIA in the last 168 hours. ? ?ABG ?   ?Component Value Date/Time  ? PHART 7.48  (H) 09/13/2021 0500  ? PCO2ART 50 (H) 09/13/2021 0500  ? PO2ART 55 (L) 09/13/2021 0500  ? HCO3 37.2 (H) 09/13/2021 0500  ? ACIDBASEDEF 1.2 06/09/2020 2326  ? O2SAT 88.6 09/13/2021 0500  ? ?  ? ?Coagulation Profile: ?Recent Labs  ?Lab 09/12/21 ?1943  ?INR 1.0  ? ? ? ?Cardiac Enzymes: ?No results for input(s): CKTOTAL, CKMB, CKMBINDEX, TROPONINI in the last 168 hours. ? ?HbA1C: ?No results found for: HGBA1C ? ?CBG: ?Recent Labs  ?Lab 09/12/21 ?1543  ?GLUCAP 155*  ? ? ? ? ? ?Ottie Glazier, M.D.  ?Pulmonary & Critical Care Medicine  ?[place]  ? ? ? ?

## 2021-09-15 NOTE — Progress Notes (Addendum)
[facility] Progress Note ? ?Patient: Jeremiah [name]    UJW:[id number]  DOA: 09/12/2021    ? ?Date of Service: the patient was seen and examined on 09/15/2021 ? ?Chief Complaint  ?Patient presents with  ? Respiratory Distress  ? ?Brief hospital course: ? 71 yo male with a PMH of Severe End-Stage Emphysema who presented to [facility] ER on 05/8 from [facility] via EMS with shortness of breath.  Per ER notes EMS reported pt in severe respiratory distress, therefore he was placed on NRB with O2 sats only increasing to 82%.  He also received duoneb, albuterol, and magnesium sulfate. He was recently hospitalized from 04/21-05/4 with acute on chronic hypoxic/hypercapnic respiratory failure secondary to severe end-stage emphysema; sepsis due to pneumonia; and right sided pleural effusion requiring right thoracentesis 08/29/21 (body fluid culture, legionella culture reflex negative). ?  ?ED Course ?In the ER pt transitioned to Bipap due to continued respiratory distress.  CXR revealed large right pleural effusion with significant interval increase and CHF.  Lab results: Na+ 132, chloride 89, CO2 33, glucose 143, BUN 30, calcium 8.7, albumin 3.1, troponin 40, wbc 25.5, and hgb 11.2.  ABG: pH 7.23/pCO2 88/pO2 56/acid-base excess 6.1/bicarb 36.9.  Pt also hypotensive and tachycardic, sepsis protocol initiated.  Pt received 2L LR bolus, ceftriaxone, duoneb x2, and 125 mg iv solumedrol.  Due to continued hypotension levophed gtt initiated.  PCCM team contacted for ICU admission.   ? ? ?Assessment and Plan: ? ?Acute on chronic hypoxic hypercapnic respiratory failure secondary to recurrent right-sided pleural effusion and possible pneumonia s/p thoracentesis 05/8 with removal of 1.5L pleural fluid and another right total on 5/9 with removal of 1.3L fluid ?Severe end stage Emphysema  ?Hx: Left lower lobe pulmonary nodule and tobacco abuse  ?- Prn Bipap for dyspnea and/or hypoxia  ?- IV and nebulized steroids ?- Scheduled and  prn bronchodilator therapy  ?- Smoking cessation counseling provided  ?- Continue nicotine patch  ?-5/9 CXR Stable to slightly smaller right-sided pneumothorax. ?5/10 CT chest: right-sided hydropneumothorax, grossly unchanged by recent radiographs. The right pleural effusion has decreased. improved aeration of the right middle and lower lobes with persistent central bronchial occlusion and perihilar opacity which could reflect a mass, suboptimally evaluated without contrast. Correlation with thoracentesis results recommended. If inconclusive, bronchoscopy may be warranted. 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 ?5/10 CXR Moderate right-sided hydropneumothorax with stable gaseous component and increased fluid component 2. Increased size of the small left pleural effusion with increased consolidation in the bilateral lung bases. ?Follow pulmonologist, patient may benefit from chest tube insertion. ?S/p IV Solu-Medrol 40 daily x3 doses given and started oral tapering prednisone ?Started Lasix 40 mg IV twice daily for diuresis ?Respiratory viral panel is pending ?Repeat chest x-ray tomorrow a.m. ? ?Sepsis with septic shock  ?Mildly elevated troponin secondary to demand ischemia in the setting of acute respiratory failure  ?- Continuous telemetry monitoring  ?- s/p Prn levophed gtt to maintain map >65 ?- Hold outpatient antihypertensives ?  ? 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 ?Hypophosphatemia  ?- Trend BMP  ?- Replace electrolytes as indicated  ?- Monitor UOP ?  ?Pneumonia, ?- Trend WBC and monitor fever curve ?- Trend PCT  ?-Fluid culture and blood culture negative  ?- right-sided pleural fluid  cytology shows metastatic adenocarcinoma ?- s/p cefepime and Zyvox  d/c'd on 5/11 and started Bactrim as per pulm ? ? ?Chronic anemia  ?- Trend CBC ?- Transfuse for hgb <7 ?- Monitor s/sx of bleeding  ?- Will resume outpatient ferrous sulfate once able to take po medications  ?  ?GERD  ?- Continue PPI  ?  ?Severe protein calorie malnutrition  ?- Dietitian consulted appreciate input  ? ?Vitamin D deficiency, started vitamin D 50,000 units p.o. weekly, follow with PCP to repeat vitamin D level after 3 months. ? ?Body mass index is 25.9 kg/m?Marland Kitchen  ?Nutrition Problem: Severe Malnutrition ?Etiology: chronic illness (COPD, CHF) ?Interventions: ?  ? ?Pressure Injury 06/15/21 Coccyx Medial Stage 2 -  Partial thickness loss of dermis presenting as a shallow open injury with a red, pink wound bed without slough. pink, first layer gone (Active)  ?06/15/21 0800  ?Location: Coccyx  ?Location Orientation: Medial  ?Staging: Stage 2 -  Partial thickness loss of dermis presenting as a shallow open injury with a red, pink wound bed without slough.  ?Wound Description (Comments): pink, first layer gone  ?Present on Admission: Yes  ?   ?Pressure Injury 06/16/21 Buttocks Right Stage 2 -  Partial thickness loss of dermis presenting as a shallow open injury with a red, pink wound bed without slough. (Active)  ?06/16/21 2322  ?Location: Buttocks  ?Location Orientation: Right  ?Staging: Stage 2 -  Partial thickness loss of dermis presenting as a shallow open injury with a red, pink wound bed without slough.  ?Wound Description (Comments):   ?Present on Admission: Yes  ?  ? ?Diet: Heart healthy ?DVT Prophylaxis: Subcutaneous Lovenox  ? ?Advance goals of care discussion: Full code ? ?Family Communication: family was NOT present at bedside, at the time of interview.  ?The pt provided permission to discuss medical plan with the family. Opportunity was given to ask question and all questions were answered satisfactorily.  ? ?Disposition:  ?Pt is from SNF, admitted with recurrent pleural effusion and found to have  possible right lung cancer, still has respiratory failure and possible pneumonia on IV antibiotics, which precludes a safe discharge. ?Discharge to SNF, when clinically stable, patient may need chest tube, awaiting for pulmonary recommendation and also patient is to be seen by oncologist. ? ?Subjective: No significant overnight issues, patient feels improvement in the breathing, denies any worsening, no chest pain or palpitation, no any other active issues. ? ? ? ?Physical Exam: ?General:   NAD, mild SOB.  ?Appear in mild distress, affect appropriate ?Eyes: PERRLA ?ENT: Oral Mucosa Clear, moist  ?Neck: no JVD,  ?Cardiovascular: S1 and S2 Present, no Murmur,  ?Respiratory: increased respiratory effort, b/l basal Crackles, decreased breath sounds right lower lobe, no wheezes ?Abdomen: Bowel Sound present, Soft and no tenderness,  ?Skin: no rashes ?Extremities: 2+ Pedal edema, no calf tenderness ?Neurologic: without any new focal findings ?Gait not checked due to patient safety concerns ? ?Vitals:  ? 09/15/21 0210 09/15/21 0617 09/15/21 0815 09/15/21 0859  ?BP:    109/62  ?Pulse:    93  ?Resp:    16  ?Temp:    97.7 ?F (36.5 ?C)  ?TempSrc:    Oral  ?SpO2: 96%  99% 97%  ?Weight:  75 kg    ?Height:      ? ? ?Intake/Output Summary (Last 24 hours) at 09/15/2021 1331 ?Last data filed at 09/15/2021 3825 ?Gross per 24 hour  ?Intake --  ?Output 300 ml  ?Net -300 ml  ? ?  Filed Weights  ? 09/13/21 0500 09/14/21 0423 09/15/21 0617  ?Weight: 68.2 kg 71 kg 75 kg  ? ? ?Data Reviewed: ?I have personally reviewed and interpreted daily labs, tele strips, imagings as discussed above. ?I reviewed all nursing notes, pharmacy notes, vitals, pertinent old records ?I have discussed plan of care as described above with RN and patient/family. ? ?CBC: ?Recent Labs  ?Lab 09/12/21 ?1123 09/13/21 ?0614 09/14/21 ?1027 09/15/21 ?0355  ?WBC 25.5* 24.2* 19.6* 16.4*  ?NEUTROABS 23.4*  --  17.2*  --   ?HGB 11.2* 9.2* 8.5* 8.5*  ?HCT 35.5* 29.0* 25.9* 25.6*   ?MCV 87.4 85.0 82.2 83.7  ?PLT 293 268 277 286  ? ?Basic Metabolic Panel: ?Recent Labs  ?Lab 09/12/21 ?1123 09/13/21 ?0614 09/14/21 ?2536 09/15/21 ?0355 09/15/21 ?1110  ?NA 132* 132* 133* 131* 129*

## 2021-09-15 NOTE — Progress Notes (Signed)
Mobility Specialist - Progress Note ? ? 09/15/21 1100  ?Mobility  ?Activity Refused mobility  ? ? ? ?2nd attempt this date. Pt very difficult to arouse on 1st attempt and declined when author returned despite encouragement to sit EOB for breakfast. Will attempt session another date/time.  ? ? ?Kathee Delton ?Mobility Specialist ?09/15/21, 11:13 AM ? ? ? ? ?

## 2021-09-16 ENCOUNTER — Inpatient Hospital Stay: Payer: Medicare HMO

## 2021-09-16 DIAGNOSIS — J9601 Acute respiratory failure with hypoxia: Secondary | ICD-10-CM | POA: Diagnosis not present

## 2021-09-16 DIAGNOSIS — J910 Malignant pleural effusion: Secondary | ICD-10-CM | POA: Diagnosis not present

## 2021-09-16 DIAGNOSIS — J9621 Acute and chronic respiratory failure with hypoxia: Secondary | ICD-10-CM | POA: Diagnosis not present

## 2021-09-16 DIAGNOSIS — J9622 Acute and chronic respiratory failure with hypercapnia: Secondary | ICD-10-CM | POA: Diagnosis not present

## 2021-09-16 DIAGNOSIS — Z7189 Other specified counseling: Secondary | ICD-10-CM | POA: Diagnosis not present

## 2021-09-16 LAB — CBC
HCT: 31.4 % — ABNORMAL LOW (ref 39.0–52.0)
Hemoglobin: 10.4 g/dL — ABNORMAL LOW (ref 13.0–17.0)
MCH: 28 pg (ref 26.0–34.0)
MCHC: 33.1 g/dL (ref 30.0–36.0)
MCV: 84.6 fL (ref 80.0–100.0)
Platelets: 337 10*3/uL (ref 150–400)
RBC: 3.71 MIL/uL — ABNORMAL LOW (ref 4.22–5.81)
RDW: 16.2 % — ABNORMAL HIGH (ref 11.5–15.5)
WBC: 15.4 10*3/uL — ABNORMAL HIGH (ref 4.0–10.5)
nRBC: 0 % (ref 0.0–0.2)

## 2021-09-16 LAB — BASIC METABOLIC PANEL
Anion gap: 4 — ABNORMAL LOW (ref 5–15)
BUN: 21 mg/dL (ref 8–23)
CO2: 38 mmol/L — ABNORMAL HIGH (ref 22–32)
Calcium: 8.2 mg/dL — ABNORMAL LOW (ref 8.9–10.3)
Chloride: 91 mmol/L — ABNORMAL LOW (ref 98–111)
Creatinine, Ser: 0.51 mg/dL — ABNORMAL LOW (ref 0.61–1.24)
GFR, Estimated: >60 mL/min (ref >60)
Glucose, Bld: 88 mg/dL (ref 70–99)
Potassium: 3.8 mmol/L (ref 3.5–5.1)
Sodium: 133 mmol/L — ABNORMAL LOW (ref 135–145)

## 2021-09-16 LAB — PHOSPHORUS: Phosphorus: 2.3 mg/dL — ABNORMAL LOW (ref 2.5–4.6)

## 2021-09-16 LAB — MAGNESIUM: Magnesium: 1.7 mg/dL (ref 1.7–2.4)

## 2021-09-16 MED ORDER — K PHOS MONO-SOD PHOS DI & MONO 155-852-130 MG PO TABS
500.0000 mg | ORAL_TABLET | Freq: Three times a day (TID) | ORAL | Status: AC
  Administered 2021-09-16 (×3): 500 mg via ORAL
  Filled 2021-09-16 (×3): qty 2

## 2021-09-16 MED ORDER — NICOTINE 21 MG/24HR TD PT24
21.0000 mg | MEDICATED_PATCH | Freq: Every day | TRANSDERMAL | Status: DC
  Administered 2021-09-16 – 2021-09-21 (×6): 21 mg via TRANSDERMAL
  Filled 2021-09-16 (×6): qty 1

## 2021-09-16 MED ORDER — PANTOPRAZOLE SODIUM 40 MG PO TBEC
40.0000 mg | DELAYED_RELEASE_TABLET | Freq: Every day | ORAL | Status: DC
  Administered 2021-09-17 – 2021-09-21 (×4): 40 mg via ORAL
  Filled 2021-09-16 (×5): qty 1

## 2021-09-16 MED ORDER — MELATONIN 5 MG PO TABS
5.0000 mg | ORAL_TABLET | Freq: Every day | ORAL | Status: DC
  Administered 2021-09-16 – 2021-09-20 (×5): 5 mg via ORAL
  Filled 2021-09-16 (×5): qty 1

## 2021-09-16 NOTE — Progress Notes (Signed)
Jeremiah [name] [place]   DOB:1950-12-26   HK#:[id number]   ? ?Subjective: No acute issues overnight.  Continues to have chronic pain.  Continues to have shortness of breath. ? ?Objective:  ?Vitals:  ? 09/16/21 0819 09/16/21 1628  ?BP: (!) 91/55 124/79  ?Pulse: 99 (!) 109  ?Resp: 18   ?Temp: 97.9 ?F (36.6 ?C) 98 ?F (36.7 ?C)  ?SpO2: 100% 100%  ?  ? ?Intake/Output Summary (Last 24 hours) at 09/16/2021 1805 ?Last data filed at 09/16/2021 0526 ?Gross per 24 hour  ?Intake 240 ml  ?Output 700 ml  ?Net -460 ml  ? ?Patient on nasal cannula oxygen.  Tachypneic. ?Physical Exam ?Vitals and nursing note reviewed.  ?HENT:  ?   Head: Normocephalic and atraumatic.  ?   Mouth/Throat:  ?   Pharynx: Oropharynx is clear.  ?Eyes:  ?   Extraocular Movements: Extraocular movements intact.  ?   Pupils: Pupils are equal, round, and reactive to light.  ?Cardiovascular:  ?   Rate and Rhythm: Normal rate and regular rhythm.  ?Pulmonary:  ?   Comments: Decreased breath sounds bilaterally.  ?Abdominal:  ?   Palpations: Abdomen is soft.  ?Musculoskeletal:     ?   General: Normal range of motion.  ?   Cervical back: Normal range of motion.  ?Skin: ?   General: Skin is warm.  ?Neurological:  ?   General: No focal deficit present.  ?   Mental Status: He is alert and oriented to person, place, and time.  ?Psychiatric:     ?   Behavior: Behavior normal.     ?   Judgment: Judgment normal.  ? ?  ?Labs:  ?Lab Results  ?Component Value Date  ? WBC 15.4 (H) 09/16/2021  ? HGB 10.4 (L) 09/16/2021  ? HCT 31.4 (L) 09/16/2021  ? MCV 84.6 09/16/2021  ? PLT 337 09/16/2021  ? NEUTROABS 17.2 (H) 09/14/2021  ? ? ?Lab Results  ?Component Value Date  ? NA 133 (L) 09/16/2021  ? K 3.8 09/16/2021  ? CL 91 (L) 09/16/2021  ? CO2 38 (H) 09/16/2021  ? ? ?Studies:  ?DG Chest Port 1 View ? ?Result Date: 09/16/2021 ?CLINICAL DATA:  Worsening shortness of breath EXAM: PORTABLE CHEST 1 VIEW COMPARISON:  Chest x-ray dated Sep 15, 2021 FINDINGS: Visualized cardiac and mediastinal contours  are stable. Bibasilar opacities which are likely due to atelectasis and background emphysema. Right hydropneumothorax with unchanged fluid component and decreased air component. IMPRESSION: Right hydropneumothorax with unchanged fluid component and decreased air component. Electronically Signed   By: Yetta Glassman M.D.   On: 09/16/2021 08:15  ? ?DG Chest Port 1 View ? ?Result Date: 09/15/2021 ?CLINICAL DATA:  Right-sided pleural effusion history of COPD and hypertension smoker EXAM: PORTABLE CHEST 1 VIEW COMPARISON:  Chest radiograph Sep 14, 2021. FINDINGS: The heart size and mediastinal contours are partially obscured but appear enlarged and unchanged. Aortic atherosclerosis. Stable tiny right pneumothorax. Increased size of the moderate right pleural effusion and small left pleural effusion. With increased consolidation of bilateral lung bases. The visualized skeletal structures are unchanged. IMPRESSION: 1. Moderate right-sided hydropneumothorax with stable gaseous component and increased fluid component 2. Increased size of the small left pleural effusion with increased consolidation in the bilateral lung bases. Electronically Signed   By: Dahlia Bailiff M.D.   On: 09/15/2021 10:44   ? ?No problem-specific Assessment & Plan notes found for this encounter. ? ?71 year old male patient with multiple medical problems including chronic respiratory failure/COPD;  active smoker is currently admitted to hospital for worsening shortness of breath/acute respiratory failure/noted to have right-sided pleural effusion. ?  ?# Right-sided pleural effusion status postthoracentesis cytology positive for adenocarcinoma; clinically suspicious for stage IV lung cancer.  I reviewed with the patient and friend unfortunately cancer cannot be determined.  Patient multiple comorbidities/poor performance status-huge barrier to systemic therapy.  Discussed hospice.  Patient and family friend interested.  Met with hospice liaison.   Reasonable to proceed with Pleurx catheter placement; defer to hospitalist service. ?  ?#Chronic respiratory failure-on nasal cannula at baseline ?  ?#Agree with DNR/DNI. ? ?Cammie Sickle, MD ?09/16/2021  6:05 PM ? ? ?

## 2021-09-16 NOTE — Progress Notes (Signed)
[facility] Progress Note ? ?Patient: Jeremiah [name]    KZL:[id number]  DOA: 09/12/2021    ? ?Date of Service: the patient was seen and examined on 09/16/2021 ? ?Chief Complaint  ?Patient presents with  ? Respiratory Distress  ? ?Brief hospital course: ? 71 yo male with a PMH of Severe End-Stage Emphysema who presented to [facility] ER on 05/8 from [facility] via EMS with shortness of breath.  Per ER notes EMS reported pt in severe respiratory distress, therefore he was placed on NRB with O2 sats only increasing to 82%.  He also received duoneb, albuterol, and magnesium sulfate. He was recently hospitalized from 04/21-05/4 with acute on chronic hypoxic/hypercapnic respiratory failure secondary to severe end-stage emphysema; sepsis due to pneumonia; and right sided pleural effusion requiring right thoracentesis 08/29/21 (body fluid culture, legionella culture reflex negative). ?  ?ED Course ?In the ER pt transitioned to Bipap due to continued respiratory distress.  CXR revealed large right pleural effusion with significant interval increase and CHF.  Lab results: Na+ 132, chloride 89, CO2 33, glucose 143, BUN 30, calcium 8.7, albumin 3.1, troponin 40, wbc 25.5, and hgb 11.2.  ABG: pH 7.23/pCO2 88/pO2 56/acid-base excess 6.1/bicarb 36.9.  Pt also hypotensive and tachycardic, sepsis protocol initiated.  Pt received 2L LR bolus, ceftriaxone, duoneb x2, and 125 mg iv solumedrol.  Due to continued hypotension levophed gtt initiated.  PCCM team contacted for ICU admission.   ? ? ?Assessment and Plan: ? ?Acute on chronic hypoxic hypercapnic respiratory failure secondary to recurrent right-sided pleural effusion and possible pneumonia s/p thoracentesis 05/8 with removal of 1.5L pleural fluid and another right total on 5/9 with removal of 1.3L fluid ?Severe end stage Emphysema  ?Hx: Left lower lobe pulmonary nodule and tobacco abuse  ?- Prn Bipap for dyspnea and/or hypoxia  ?- IV and nebulized steroids ?- Scheduled and  prn bronchodilator therapy  ?- Smoking cessation counseling provided  ?- Continue nicotine patch  ?-5/9 CXR Stable to slightly smaller right-sided pneumothorax. ?5/10 CT chest: right-sided hydropneumothorax, grossly unchanged by recent radiographs. The right pleural effusion has decreased. improved aeration of the right middle and lower lobes with persistent central bronchial occlusion and perihilar opacity which could reflect a mass, suboptimally evaluated without contrast. Correlation with thoracentesis results recommended. If inconclusive, bronchoscopy may be warranted. 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 ?5/10 CXR Moderate right-sided hydropneumothorax with stable gaseous component and increased fluid component 2. Increased size of the small left pleural effusion with increased consolidation in the bilateral lung bases. ?S/p IV Solu-Medrol 40 daily x3 doses given and started oral tapering prednisone ?Started Lasix 40 mg IV twice daily for diuresis ?Respiratory viral panel is pending ?5/12 CXR shows worsening of right-sided pleural effusion ?Patient decided to be DNR/DNI and hospice care, patient wants chest tube insertion for palliative comfort measures ?We will keep n.p.o. after midnight on Sunday for Pleurx chest tube insertion on Monday ?Discussed with IR ? ? ?Sepsis with septic shock  ?Mildly elevated troponin secondary to demand ischemia in the setting of acute respiratory failure  ?- Continuous telemetry monitoring  ?- s/p Prn levophed gtt to maintain map >65 ?- Hold outpatient antihypertensives ?  ? 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 ?Hypophosphatemia  ?- Trend BMP  ?- Replace electrolytes as indicated  ?-  Monitor UOP ?   ?Pneumonia, ?- Trend WBC and monitor fever curve ?- Trend PCT  ?-Fluid culture and blood culture negative  ?- right-sided pleural fluid cytology shows metastatic adenocarcinoma ?- s/p cefepime and Zyvox d/c'd on 5/11 and started Bactrim as per pulm ? ? ?Chronic anemia  ?- Trend CBC ?- Transfuse for hgb <7 ?- Monitor s/sx of bleeding  ?- Will resume outpatient ferrous sulfate once able to take po medications  ?  ?GERD  ?- Continue PPI  ?  ?Severe protein calorie malnutrition  ?- Dietitian consulted appreciate input  ? ?Vitamin D deficiency, started vitamin D 50,000 units p.o. weekly, follow with PCP to repeat vitamin D level after 3 months. ? ?Body mass index is 25.28 kg/m?Marland Kitchen  ?Nutrition Problem: Severe Malnutrition ?Etiology: chronic illness (COPD, CHF) ?Interventions: ?  ? ?Pressure Injury 06/15/21 Coccyx Medial Stage 2 -  Partial thickness loss of dermis presenting as a shallow open injury with a red, pink wound bed without slough. pink, first layer gone (Active)  ?06/15/21 0800  ?Location: Coccyx  ?Location Orientation: Medial  ?Staging: Stage 2 -  Partial thickness loss of dermis presenting as a shallow open injury with a red, pink wound bed without slough.  ?Wound Description (Comments): pink, first layer gone  ?Present on Admission: Yes  ?   ?Pressure Injury 06/16/21 Buttocks Right Stage 2 -  Partial thickness loss of dermis presenting as a shallow open injury with a red, pink wound bed without slough. (Active)  ?06/16/21 2322  ?Location: Buttocks  ?Location Orientation: Right  ?Staging: Stage 2 -  Partial thickness loss of dermis presenting as a shallow open injury with a red, pink wound bed without slough.  ?Wound Description (Comments):   ?Present on Admission: Yes  ?  ? ?Diet: Heart healthy ?DVT Prophylaxis: Subcutaneous Lovenox  ? ?Advance goals of care discussion: Full code ? ?Family Communication: family was present at bedside, at the time of interview.  ?The pt provided permission to discuss medical  plan with the family. Opportunity was given to ask question and all questions were answered satisfactorily.  ? ?Disposition:  ?Pt is from SNF, admitted with recurrent pleural effusion and found to have possible right lung cancer, still has respiratory failure and possible pneumonia on IV antibiotics, which precludes a safe discharge. ?Discharge to SNF with hospice services, after Pleurx chest tube insertion on Monday by IR. ? ? ?Subjective: No significant overnight issues, patient's breathing is gradually getting worse could be secondary to worsening of pleural effusion.  Patient does not realize any worsening of shortness of breath, patient denies any chest pain or palpitations. ?Goals of care discussed in presence of patient's close friend, she was available and her niece was available as well.  Patient decided for DNR/DNI and hospice care, but patient was chest tube insertion for comfort measures until he can get in touch with his family members. ? ? ?Physical Exam: ?General:   NAD, mild SOB.  ?Appear in mild distress, affect appropriate ?Eyes: PERRLA ?ENT: Oral Mucosa Clear, moist  ?Neck: no JVD,  ?Cardiovascular: S1 and S2 Present, no Murmur,  ?Respiratory: increased respiratory effort, b/l basal Crackles, decreased breath sounds right lower lobe, no wheezes ?Abdomen: Bowel Sound present, Soft and no tenderness,  ?Skin: no rashes ?Extremities: 2+ Pedal edema, no calf tenderness ?Neurologic: without any new focal findings ?Gait not checked due to patient safety concerns ? ?Vitals:  ? 09/16/21 0408 09/16/21 0500 09/16/21 0819 09/16/21 1628  ?BP: 103/66  (!) 91/55 124/79  ?  Pulse: (!) 101  99 (!) 109  ?Resp: 20  18   ?Temp: 97.6 ?F (36.4 ?C)  97.9 ?F (36.6 ?C) 98 ?F (36.7 ?C)  ?TempSrc: Oral   Oral  ?SpO2: 97%  100% 100%  ?Weight:  73.2 kg    ?Height:      ? ? ?Intake/Output Summary (Last 24 hours) at 09/16/2021 1635 ?Last data filed at 09/16/2021 0526 ?Gross per 24 hour  ?Intake 240 ml  ?Output 700 ml  ?Net -460 ml   ? ?Filed Weights  ? 09/14/21 0423 09/15/21 0617 09/16/21 0500  ?Weight: 71 kg 75 kg 73.2 kg  ? ? ?Data Reviewed: ?I have personally reviewed and interpreted daily labs, tele strips, imagings as discussed above. ?

## 2021-09-16 NOTE — Progress Notes (Addendum)
[place] [facility]) Hospital Liaison Note ?  ?Received request from Transitions of Care Manager, Caryl Pina, for hospice services at home after discharge. Chart and patient information under review by [facility] physician. Hospice eligibility approved. ?  ?Spoke with friend/Annette & patient to initiate education related to hospice philosophy, services, and team approach to care. Both verbalized understanding of information given. Per discussion, the plan is for patient to discharge to [facility] via [facility] once cleared to DC.  ? ?Patient to D/C to [facility] (LTC) and will not require DME. ?  ?Please send signed and completed DNR home with patient/family. Please provide prescriptions at discharge as needed to ensure ongoing symptom management.  ?  ?[facility] information and contact numbers given to family & above information shared with TOC. ?  ?Please call with any questions/concerns.  ?  ?Thank you for the opportunity to participate in this patient's care. ?  ?Daphene Calamity, MSW ?[place]  ?[phone] ? ?

## 2021-09-16 NOTE — Code Documentation (Signed)
Goals of care and living will discussed with the patient and he decided to be DNR/DNI ?Patient will like to proceed with hospice placement and he will like to have chest tube insertion for comfort measures. ?Time 30 min  ?

## 2021-09-16 NOTE — TOC Progression Note (Signed)
Transition of Care (TOC) - Progression Note  ? ? ?Patient Details  ?Name: Jeremiah [name] ?MRN: [id number] ?Date of Birth: 09-13-1950 ? ?Transition of Care (TOC) CM/SW Contact  ?Alberteen Sam, LCSW ?Phone Number: ?09/16/2021, 1:25 PM ? ?Clinical Narrative:    ? ?Patient and Annettee met with Lorayne Bender with [facility] agreeable for hospice to follow to [facility] at discharge.  ? ?Pending patient's medical readiness to dc to [facility].  ? ?Expected Discharge Plan: [name] ?Barriers to Discharge: Continued Medical Work up ? ?Expected Discharge Plan and Services ?Expected Discharge Plan: [name] ?  ?  ?  ?  ?                ?  ?  ?  ?  ?  ?  ?  ?  ?  ?  ? ? ?Social Determinants of Health (SDOH) Interventions ?  ? ?Readmission Risk Interventions ? ?  08/29/2021  ?  2:00 PM 10/21/2020  ?  1:14 PM 09/27/2020  ? 11:18 AM  ?Readmission Risk Prevention Plan  ?Transportation Screening Complete Complete Complete  ?PCP or Specialist Appt within 3-5 Days  Complete Complete  ?[place] or Home Care Consult  Complete Complete  ?Social Work Consult for [place] Planning/Counseling  Complete Not Complete  ?SW consult not completed comments   RNCM assigned to patient  ?Palliative Care Screening  Not Applicable Not Applicable  ?Medication Review Press photographer) Complete Complete Complete  ?PCP or Specialist appointment within 3-5 days of discharge Complete    ?[place] or Home Care Consult Complete    ?SW Recovery Care/Counseling Consult Complete    ?Palliative Care Screening Not Applicable    ?[place] Not Complete    ?SNF Comments waiting on PT recomendation    ? ? ?

## 2021-09-16 NOTE — Progress Notes (Addendum)
? ?                                                                                                                                                     ?                                                   ?Daily Progress Note  ? ?Patient Name: Jeremiah [name]       Date: 09/16/2021 ?DOB: 1950/08/07  Age: 71 y.o. MRN#: [id number] ?Attending Physician: Val Riles, MD ?Primary Care Physician: [place] Date: 09/12/2021 ? ?Reason for Consultation/Follow-up: Establishing goals of care ? ?Subjective: ?Notes and labs reviewed. In to see patient. He tells me yesterday was a bad day and he needed time to himself to think. He states he understands his status and has decided for hospice to follow outpatient; we discussed his prognosis. He discusses that this is all a lot for him and he is working to process it. He confirms he would want [name] to be his surrogate Media planner. He states they live together as housemates, and clarifies that she has a boyfriend. He states he has 4 siblings. He states he has 1 daughter "who can't even make decisions for herself". He tells me he has not really talked to any of his family in over 10 years, though as far as he knows they live in the [place]. Discussed HPOA/AD documents in depth. Offered to facilitate chaplain coming to do HPOA or AD packet and he declined stating this could be handled once he is discharged.  ? ?Length of Stay: 4 ? ?Current Medications: ?Scheduled Meds:  ? vitamin C  500 mg Oral BID  ? budesonide (PULMICORT) nebulizer solution  0.5 mg Nebulization BID  ? chlorhexidine  15 mL Mouth Rinse BID  ? Chlorhexidine Gluconate Cloth  6 each Topical Q0600  ? enoxaparin (LOVENOX) injection  40 mg Subcutaneous Q24H  ? feeding supplement  237 mL Oral TID BM  ? furosemide  40 mg Intravenous BID  ? guaiFENesin  600 mg Oral BID  ? ipratropium-albuterol  3 mL Nebulization Once  ? ipratropium-albuterol  3 mL Nebulization Q6H  ? mouth rinse  15 mL  Mouth Rinse q12n4p  ? melatonin  5 mg Oral QHS  ? multivitamin with minerals  1 tablet Oral Daily  ? mupirocin ointment  1 application. Nasal BID  ? nicotine  21 mg Transdermal Daily  ? [START ON 09/17/2021] pantoprazole  40 mg Oral Daily  ? phosphorus  500 mg Oral TID  ? [START ON 09/17/2021] predniSONE  20 mg Oral Q breakfast  ? Followed by  ? [START ON  09/18/2021] predniSONE  10 mg Oral Q breakfast  ? sulfamethoxazole-trimethoprim  1 tablet Oral Q12H  ? Vitamin D (Ergocalciferol)  50,000 Units Oral Q7 days  ? zinc sulfate  220 mg Oral Daily  ? ? ?Continuous Infusions: ? sodium chloride 10 mL/hr at 09/12/21 2028  ? ? ?PRN Meds: ?sodium chloride, acetaminophen, docusate sodium, HYDROcodone bit-homatropine, polyethylene glycol ? ?Physical Exam ?Pulmonary:  ?   Effort: Pulmonary effort is normal.  ?Neurological:  ?   Mental Status: He is alert.  ?         ? ?Vital Signs: BP (!) 91/55 (BP Location: Right Arm)   Pulse 99   Temp 97.9 ?F (36.6 ?C)   Resp 18   Ht 5\' 7"  (1.702 m)   Wt 73.2 kg   SpO2 100%   BMI 25.28 kg/m?  ?SpO2: SpO2: 100 % ?O2 Device: O2 Device: Nasal Cannula ?O2 Flow Rate: O2 Flow Rate (L/min): 4 L/min ? ?Intake/output summary:  ?Intake/Output Summary (Last 24 hours) at 09/16/2021 1506 ?Last data filed at 09/16/2021 0526 ?Gross per 24 hour  ?Intake 240 ml  ?Output 700 ml  ?Net -460 ml  ? ?LBM: Last BM Date : 09/15/21 ?Baseline Weight: Weight: 68 kg ?Most recent weight: Weight: 73.2 kg ? ? ? ? ?Patient Active Problem List  ? Diagnosis Date Noted  ? Pleural effusion, malignant   ? Acute on chronic respiratory failure ([place]) 09/12/2021  ? Insomnia 09/02/2021  ? Hypotension 09/01/2021  ? Left lower lobe pulmonary nodule 08/26/2021  ? Depression 08/26/2021  ? Sepsis due to pneumonia ([place]) 08/26/2021  ? Pleural effusion on right 08/26/2021  ? Chronic diastolic CHF (congestive heart failure) ([place]) 07/01/2021  ? Chronic hypoxemic respiratory failure ([place]) 07/01/2021  ? CAP (community acquired pneumonia)  06/30/2021  ? COPD with acute exacerbation ([place]) 06/30/2021  ? Thrush, oral 06/16/2021  ? Unintentional weight loss 06/16/2021  ? Pressure injury of skin 06/16/2021  ? Protein-calorie malnutrition, severe 06/16/2021  ? Dehydration   ? Syncope 06/15/2021  ? GERD (gastroesophageal reflux disease) 05/28/2021  ? Acute respiratory failure with hypoxia ([place]) 05/28/2021  ? Anemia 10/18/2020  ? COPD exacerbation ([place]) 05/14/2020  ? Nicotine dependence 05/14/2020  ? Hyponatremia 05/14/2020  ? HTN (hypertension) 05/14/2020  ? Abnormal LFTs 05/14/2020  ? Acute on chronic respiratory failure with hypoxia ([place]) 10/15/2016  ? Tobacco dependency 10/15/2016  ? ? ?Palliative Care Assessment & Plan  ? ? ?Recommendations/Plan: ? ?D/Cing with hospice to follow.  ? ?Would like Annette to be Air traffic controller; he declines completing HPOA/AD packet this admission.  ? ? ?Code Status: ? ?  ?Code Status Orders  ?(From admission, onward)  ?  ? ? ?  ? ?  Start     Ordered  ? 09/16/21 1500  Do not attempt resuscitation (DNR)  Continuous       ?Question Answer Comment  ?In the event of cardiac or respiratory ARREST Do not call a ?code blue?   ?In the event of cardiac or respiratory ARREST Do not perform Intubation, CPR, defibrillation or ACLS   ?In the event of cardiac or respiratory ARREST Use medication by any route, position, wound care, and other measures to relive pain and suffering. May use oxygen, suction and manual treatment of airway obstruction as needed for comfort.   ?  ? 09/16/21 1459  ? ?  ?  ? ?  ? ?Code Status History   ? ? Date Active Date Inactive Code Status Order ID Comments User  Context  ? 09/12/2021 1304 09/16/2021 1459 Full Code [id number]  Teressa Lower, NP ED  ? 08/26/2021 2115 09/08/2021 1652 Full Code [id number]  Cox, Briant Cedar, DO ED  ? 06/30/2021 1941 07/05/2021 2207 Full Code [id number]  Rise Patience, MD Inpatient  ? 06/15/2021 1903 06/24/2021 2214 Full Code [id number]  CoxBriant Cedar, DO ED  ? 05/28/2021 1915 06/03/2021  1843 Full Code [id number]  Para Skeans, MD ED  ? 10/18/2020 2249 10/23/2020 1941 Full Code [id number]  Para Skeans, MD ED  ? 09/26/2020 1714 09/30/2020 1501 Full Code [id number]  Gwynne Edinger, MD ED  ? 06/10/2020 0132 06/17/2020 1755 Full Code [id number]  Toy Baker, MD ED  ? 05/15/2020 0057 05/25/2020 2317 Full Code [id number]  Athena Masse, MD ED  ? 10/15/2016 2011 10/20/2016 1435 Full Code [id number]  Idelle Crouch, MD Inpatient  ? 07/28/2015 1457 08/06/2015 2006 Full Code [id number]  Gladstone Lighter, MD Inpatient  ? ?  ? ? ?Thank you for allowing the Palliative Medicine Team to assist in the care of this patient. ? ? ?Asencion Gowda, NP ? ?Please contact Palliative Medicine Team phone at [phone] for questions and concerns.  ? ? ? ? ? ?

## 2021-09-16 NOTE — Progress Notes (Signed)
Occupational Therapy Treatment ?Patient Details ?Name: Jeremiah [name] ?MRN: [id number] ?DOB: 1950-08-21 ?Today's Date: 09/16/2021 ? ? ?History of present illness 71 year old male with history of tobacco dependence, depression, anxiety, COPD, hypertension, GERD, chronic respiratory failure on 4-5L O2, who was recently discharged to SNF, returns with breathing issues - thoracentesis 5/9. ?  ?OT comments ? Pt in bed upon OT arrival.  Pt adamantly refusing OOB activity and insistent on use of bedpan over 3in1.  OT educated on benefits of OOB activity for increasing activity tolerance and reducing risk of pressure sores.  Pt continued to decline OT's attempts.  OT assisted max A for rolling to the R for bed pan placement, self limiting with pushing against OT and insisting that he can't help with bed mobility.  Max vc and tactile cues for sequencing to bend knee and reach for bed rail to hold self in side lying.  Pt requested 02 bump up from 4 to 5L d/t dyspnea with bed mobility.  Pt at 88% on 4L with bed mobility, bumped to 5L and sats back to 92%.  Pt requested to be left to sit on bed pan for awhile.  OT raised HOB for optimal breathing, per pt request.  OT notified NT of above who agreed to check back on pt to assist off bed pan, and agreed to bump 02 back to 4L upon completion of toileting. Will continue to follow to work towards OT goals.  Continue to recommend SNF at d/c.  Pt left in bed with alarm set and call light within reach.   ? ?Recommendations for follow up therapy are one component of a multi-disciplinary discharge planning process, led by the attending physician.  Recommendations may be updated based on patient status, additional functional criteria and insurance authorization. ?   ?Follow Up Recommendations ? Skilled nursing-short term rehab (<3 hours/day)  ?  ?Assistance Recommended at Discharge Frequent or constant Supervision/Assistance  ?Patient can return home with the following ? A lot of help  with walking and/or transfers;A lot of help with bathing/dressing/bathroom;Assistance with cooking/housework;Help with stairs or ramp for entrance;Assist for transportation ?  ?Equipment Recommendations ? Other (comment) (defer to next venue of care)  ?  ?   ? ?  ?Precautions / Restrictions Precautions ?Precautions: Fall ?Restrictions ?Weight Bearing Restrictions: No  ? ? ?  ? ?Mobility Bed Mobility ?Overal bed mobility: Needs Assistance ?Bed Mobility: Rolling ?Rolling: Max assist ?  ?  ?  ?  ?General bed mobility comments: Pt is very resistive to movement this session ?Patient Response: Anxious ? ?Transfers ?  ?  ?  ?  ?  ?  ?  ?  ?  ?General transfer comment: Pt refusal ?  ?  ?Balance   ?  ?  ?Sitting balance - Comments: NT, pt refused ?  ?  ?  ?Standing balance comment: NT, pt refused ?  ?  ?  ?  ?  ?  ?  ?  ?  ?  ?  ?   ? ?ADL either performed or assessed with clinical judgement  ? ?ADL Overall ADL's : Needs assistance/impaired ?  ?  ?  ?  ?  ?  ?  ?  ?  ?  ?  ?  ?  ?  ?Toileting- Clothing Manipulation and Hygiene: Total assistance;Bed level ?Toileting - Clothing Manipulation Details (indicate cue type and reason): Pt adamantly refused attempt to transfer to [facility] and insisted on bed pan ?  ?  ?  ?  General ADL Comments: max A for rolling, self limiting with pushing against OT and insisting that he can't help with bed mobility.  Max vc and tactile cues for sequencing to bend knee and reach for bed roll to assist with rolling to side for placement of bedpan. ?  ? ?Extremity/Trunk Assessment Upper Extremity Assessment ?Upper Extremity Assessment: Generalized weakness ?  ?Lower Extremity Assessment ?Lower Extremity Assessment: Generalized weakness ?  ?  ?  ? ?Vision Patient Visual Report: No change from baseline ?  ?  ?   ?  ?   ?  ? ?Cognition Arousal/Alertness: Awake/alert ?Behavior During Therapy: Anxious ?Overall Cognitive Status: Within Functional Limits for tasks assessed ?  ?  ?  ?  ?  ?  ?  ?  ?  ?  ?  ?  ?  ?   ?  ?  ?General Comments: anxious, unmotivated for any OOB activity, insistent on use of bed pan vs 3in1 ?  ?  ?   ?   ? ?  ?   ? ? ?  ?General Comments Sp02 at 88% on 4L with bed mobility, bumped up to 5L for rolling and use of bed pan; NT phoned to assist pt off bed pan as pt stated he needed to sit on it for awhile.  NT verbalized she would bump 02 back down to 4L once pt finished voiding.  ? ? ?Pertinent Vitals/ Pain       Pain Assessment ?Pain Assessment: No/denies pain ?Pain Intervention(s): Limited activity within patient's tolerance, Monitored during session, Repositioned ? ?   ?  ?  ?  ?  ?  ?  ?  ?  ?  ?  ?  ?  ?  ?  ?  ?  ?  ?  ? ?  ?    ?  ?  ?  ?   ? ?Frequency ? Min 2X/week  ? ? ? ? ?  ?Progress Toward Goals ? ?OT Goals(current goals can now be found in the care plan section) ? Progress towards OT goals: Not progressing toward goals - comment (self limiting, anxious) ? ?Acute Rehab OT Goals ?Patient Stated Goal: to improve breathing and feel better ?OT Goal Formulation: With patient ?Time For Goal Achievement: 09/28/21 ?Potential to Achieve Goals: Good  ?Plan Discharge plan remains appropriate;Frequency remains appropriate   ? ? ? ? ?   ?  ?  ?  ?  ? ?  ?AM-PAC OT "6 Clicks" Daily Activity     ?Outcome Measure ? ? Help from another person eating meals?: A Little ?Help from another person taking care of personal grooming?: A Lot ?Help from another person toileting, which includes using toliet, bedpan, or urinal?: A Lot ?Help from another person bathing (including washing, rinsing, drying)?: A Lot ?Help from another person to put on and taking off regular upper body clothing?: A Lot ?Help from another person to put on and taking off regular lower body clothing?: A Lot ?6 Click Score: 13 ? ?  ?   ? ?OT Visit Diagnosis: Unsteadiness on feet (R26.81);Muscle weakness (generalized) (M62.81) ?  ?Activity Tolerance Other (comment) (pt continues to be anxious and self limiting) ?  ?Patient Left in bed;with call  bell/phone within reach;with bed alarm set ?  ?Nurse Communication Other (comment) (notified RN and NT of pt on bed pan and he requested to sit on it for awhile.  NT agreed she would check in on pt to assist off  bed pan and turn 02 back down to 4L as OT bumped up to 5L for bed mobility and use of bed pan.) ?  ? ?   ? ?Time: 1001-1018 ?OT Time Calculation (min): 17 min ? ?Charges: OT General Charges ?$OT Visit: 1 Visit ?OT Treatments ?$Self Care/Home Management : 8-22 mins ? ?Leta Speller, MS, OTR/L ? ? ?Darleene Cleaver ?09/16/2021, 10:44 AM ?

## 2021-09-16 NOTE — Progress Notes (Signed)
? ?NAME:  Jeremiah [name], MRN:  [id number], DOB:  25-Feb-1951, LOS: 4 ?ADMISSION DATE:  09/12/2021, CONSULTATION DATE: 09/12/2021 ?REFERRING MD: Dr, Cinda Quest, CHIEF COMPLAINT:  Shortness of Breath   ? ?History of Present Illness:  ?This is a 71 yo male with a PMH of Severe End-Stage Emphysema who presented to [facility] ER on 05/8 from [facility] via EMS with shortness of breath.  Per ER notes EMS reported pt in severe respiratory distress, therefore he was placed on NRB with O2 sats only increasing to 82%.  He also received duoneb, albuterol, and magnesium sulfate. He was recently hospitalized from 04/21-05/4 with acute on chronic hypoxic/hypercapnic respiratory failure secondary to severe end-stage emphysema; sepsis due to pneumonia; and right sided pleural effusion requiring right thoracentesis 08/29/21 (body fluid culture, legionella culture reflex negative). ? ?ED Course ?In the ER pt transitioned to Bipap due to continued respiratory distress.  CXR revealed large right pleural effusion with significant interval increase and CHF.  Lab results: Na+ 132, chloride 89, CO2 33, glucose 143, BUN 30, calcium 8.7, albumin 3.1, troponin 40, wbc 25.5, and hgb 11.2.  ABG: pH 7.23/pCO2 88/pO2 56/acid-base excess 6.1/bicarb 36.9.  Pt also hypotensive and tachycardic, sepsis protocol initiated.  Pt received 2L LR bolus, ceftriaxone, duoneb x2, and 125 mg iv solumedrol.  Due to continued hypotension levophed gtt initiated.  PCCM team contacted for ICU admission.   ? ?09/15/21- I met with patient and family at bedside.  We discussed his pleural effusion and pathology.  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  We reivewed palliative care options vs medical oncology evaluation. He wants to discuss options with oncologist.  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 ?09/16/21- patient with no acute events overnight. He is process of discussing code status. He had oncology evaluation  and is considering his medical wishes. No changes in his medical plan. ? ?Pertinent  Medical History  ?Asthma ?Severe End-stage Emphysema ?O2 Dependent  ?GERD ?HTN ?Multifocal Pneumonia  ?Sepsis ?Tobacco Abuse  ? ?Significant Hospital Events: ?Including procedures, antibiotic start and stop dates in addition to other pertinent events   ?05/8: Pt admitted to ICU with acute on chronic hypoxic hypercapnic respiratory failure secondary to recurrent right-sided pleural effusion and sepsis secondary to possible pneumonia requiring Bipap and levophed gtt  ?05/8: Right-sided thoracentesis performed per IR with removal of 1.5L of pleural fluid  ?05/9: Pt off levophed gtt, alert/oriented, on Bipap FiO2 28% will transition off Bipap ? ?Interim History / Subjective:  ? ?No acute events overnight ?Patient had repeat thoracentesis yesterday with 1.3L removed.  ? ?Objective   ?Blood pressure (!) 91/55, pulse 99, temperature 97.9 ?F (36.6 ?C), resp. rate 18, height 5' 7" (1.702 m), weight 73.2 kg, SpO2 100 %. ?   ?   ? ?Intake/Output Summary (Last 24 hours) at 09/16/2021 1332 ?Last data filed at 09/16/2021 0526 ?Gross per 24 hour  ?Intake 240 ml  ?Output 700 ml  ?Net -460 ml  ? ? ?Filed Weights  ? 09/14/21 0423 09/15/21 0617 09/16/21 0500  ?Weight: 71 kg 75 kg 73.2 kg  ? ? ?Examination: ?General: frail cachectic male, no acute distress ?HEENT: sclera anictic, burn wounds on nostrils ?Neuro: following commands, moving all extremities ?CV: rrr, s1s2, no murmurs ?PULM: diminished breath sounds. Scattered rhonchi ?GI: soft, non-tender, non-distended, BS+ ?Extremities: warm, no edema ?Skin: sacral wounds present on admission ? ?Assessment & Plan:  ?Acute on chronic hypoxic hypercapnic  respiratory failure secondary to recurrent right-sided pleural effusion and possible pneumonia s/p thoracentesis 05/8 with removal of 1.5L and 5/9 with removal of 1.3L pleural fluid  ?Severe end stage Emphysema  ?Mediastinal and Hilar Adenopathy ?Possible  Right Hilar Mass ?Hx: Left lower lobe pulmonary nodule and tobacco abuse  ?- Continue nebulizer treatments ?- Continue IV steroids ?- Prn Bipap for dyspnea and/or hypoxia  ?- IV and nebulized steroids ?- Smoking cessation counseling provided  ?- Continue nicotine patch  ?- CT Chest today shows concern for possible right perihilar mass. We are awaiting pleural fluid cytology results.  ?- Depending on pleural fluid cytology, he may require placement of pleurX catheter if he has malignant effusion.  ? ? ?Malignant pleural effusion  ?   - adenocarcinoma stage 4 ?   - goals of care addressed today  ?   - patient wishes to have oncology evaluation.  ? ? ? ?Best Practice (right click and "Reselect all SmartList Selections" daily)  ? ?Per primary team ? ?Labs   ?CBC: ?Recent Labs  ?Lab 09/12/21 ?1123 09/13/21 ?0614 09/14/21 ?6147 09/15/21 ?0355 09/16/21 ?0458  ?WBC 25.5* 24.2* 19.6* 16.4* 15.4*  ?NEUTROABS 23.4*  --  17.2*  --   --   ?HGB 11.2* 9.2* 8.5* 8.5* 10.4*  ?HCT 35.5* 29.0* 25.9* 25.6* 31.4*  ?MCV 87.4 85.0 82.2 83.7 84.6  ?PLT 293 268 277 286 337  ? ? ? ?Basic Metabolic Panel: ?Recent Labs  ?Lab 09/12/21 ?1123 09/13/21 ?0614 09/14/21 ?0929 09/15/21 ?0355 09/15/21 ?1110 09/16/21 ?0458  ?NA 132* 132* 133* 131* 129* 133*  ?K 4.1 3.9 4.0 3.6 3.8 3.8  ?CL 89* 93* 93* 93* 90* 91*  ?CO2 33* 35* 34* 35* 35* 38*  ?GLUCOSE 143* 112* 80 98 118* 88  ?BUN 30* 32* 27* _0 ?CREATININE 0.63 0.62 0.59* 0.53* 0.57* 0.51*  ?CALCIUM 8.7* 8.4* 7.9* 7.7* 8.0* 8.2*  ?MG 1.9 1.9 1.8 1.8  --  1.7  ?PHOS 4.1 2.2* 1.9* 2.7  --  2.3*  ? ? ?GFR: ?Estimated Creatinine Clearance: 79.2 mL/min (A) (by C-G formula based on SCr of 0.51 mg/dL (L)). ?Recent Labs  ?Lab 09/12/21 ?1123 09/12/21 ?1255 09/12/21 ?1943 09/13/21 ?5747 09/14/21 ?3403 09/15/21 ?0355 09/16/21 ?0458  ?PROCALCITON  --  0.11  --  0.13 <0.10  --   --   ?WBC 25.5*  --   --  24.2* 19.6* 16.4* 15.4*  ?LATICACIDVEN 1.4  --  2.2*  --   --   --   --   ? ? ? ?Liver Function  Tests: ?Recent Labs  ?Lab 09/12/21 ?1123 09/15/21 ?1110  ?AST 28 31  ?ALT 30 28  ?ALKPHOS 102 88  ?BILITOT 0.9 0.7  ?PROT 6.3* 5.5*  ?ALBUMIN 3.1* 2.6*  ? ? ?Recent Labs  ?Lab 09/15/21 ?1110  ?LIPASE 42  ? ? ?No results for input(s): AMMONIA in the last 168 hours. ? ?ABG ?   ?Component Value Date/Time  ? PHART 7.48 (H) 09/13/2021 0500  ? PCO2ART 50 (H) 09/13/2021 0500  ? PO2ART 55 (L) 09/13/2021 0500  ? HCO3 37.2 (H) 09/13/2021 0500  ? ACIDBASEDEF 1.2 06/09/2020 2326  ? O2SAT 88.6 09/13/2021 0500  ? ?  ? ?Coagulation Profile: ?Recent Labs  ?Lab 09/12/21 ?1943  ?INR 1.0  ? ? ? ?Cardiac Enzymes: ?No results for input(s): CKTOTAL, CKMB, CKMBINDEX, TROPONINI in the last 168 hours. ? ?HbA1C: ?No results found for: HGBA1C ? ?CBG: ?Recent Labs  ?Lab 09/12/21 ?1543  ?GLUCAP 155*  ? ? ? ? ? ?  Ottie Glazier, M.D.  ?Pulmonary & Critical Care Medicine  ?[place]  ? ? ? ?

## 2021-09-16 NOTE — Progress Notes (Signed)
PT Cancellation Note ? ?Patient Details ?Name: Jeremiah [name] ?MRN: [id number] ?DOB: 1950/12/30 ? ? ?Cancelled Treatment:    Reason Eval/Treat Not Completed: Patient declined, no reason specified Attempted to see pt for PT tx with pt received in bed. Pt declines all of PT's attempts at mobility (sit in recliner, sit EOB, BLE exercises at bed level). Will f/u as able & as pt is agreeable to participate. ? ?Lavone Nian, PT, DPT ?09/16/21, 2:29 PM ? ? ? ?Waunita Schooner ?09/16/2021, 2:29 PM ?

## 2021-09-17 ENCOUNTER — Inpatient Hospital Stay: Payer: Medicare HMO

## 2021-09-17 DIAGNOSIS — J9621 Acute and chronic respiratory failure with hypoxia: Secondary | ICD-10-CM | POA: Diagnosis not present

## 2021-09-17 DIAGNOSIS — J9622 Acute and chronic respiratory failure with hypercapnia: Secondary | ICD-10-CM | POA: Diagnosis not present

## 2021-09-17 LAB — BASIC METABOLIC PANEL
Anion gap: 7 (ref 5–15)
BUN: 18 mg/dL (ref 8–23)
CO2: 37 mmol/L — ABNORMAL HIGH (ref 22–32)
Calcium: 7.7 mg/dL — ABNORMAL LOW (ref 8.9–10.3)
Chloride: 90 mmol/L — ABNORMAL LOW (ref 98–111)
Creatinine, Ser: 0.59 mg/dL — ABNORMAL LOW (ref 0.61–1.24)
GFR, Estimated: >60 mL/min (ref >60)
Glucose, Bld: 109 mg/dL — ABNORMAL HIGH (ref 70–99)
Potassium: 3.5 mmol/L (ref 3.5–5.1)
Sodium: 134 mmol/L — ABNORMAL LOW (ref 135–145)

## 2021-09-17 LAB — CBC
HCT: 29.3 % — ABNORMAL LOW (ref 39.0–52.0)
Hemoglobin: 9.4 g/dL — ABNORMAL LOW (ref 13.0–17.0)
MCH: 27.2 pg (ref 26.0–34.0)
MCHC: 32.1 g/dL (ref 30.0–36.0)
MCV: 84.7 fL (ref 80.0–100.0)
Platelets: 281 10*3/uL (ref 150–400)
RBC: 3.46 MIL/uL — ABNORMAL LOW (ref 4.22–5.81)
RDW: 16.7 % — ABNORMAL HIGH (ref 11.5–15.5)
WBC: 13.3 10*3/uL — ABNORMAL HIGH (ref 4.0–10.5)
nRBC: 0 % (ref 0.0–0.2)

## 2021-09-17 LAB — CULTURE, BLOOD (SINGLE)
Culture: NO GROWTH
Special Requests: ADEQUATE

## 2021-09-17 LAB — PHOSPHORUS: Phosphorus: 3.8 mg/dL (ref 2.5–4.6)

## 2021-09-17 LAB — MAGNESIUM: Magnesium: 2 mg/dL (ref 1.7–2.4)

## 2021-09-17 MED ORDER — HYDROXYZINE HCL 25 MG PO TABS
25.0000 mg | ORAL_TABLET | Freq: Four times a day (QID) | ORAL | Status: DC | PRN
  Administered 2021-09-19 – 2021-09-21 (×4): 25 mg via ORAL
  Filled 2021-09-17 (×4): qty 1

## 2021-09-17 MED ORDER — OXYCODONE HCL 5 MG PO TABS
5.0000 mg | ORAL_TABLET | ORAL | Status: DC | PRN
  Administered 2021-09-17 – 2021-09-18 (×5): 5 mg via ORAL
  Filled 2021-09-17 (×5): qty 1

## 2021-09-17 MED ORDER — POTASSIUM CHLORIDE CRYS ER 20 MEQ PO TBCR
40.0000 meq | EXTENDED_RELEASE_TABLET | Freq: Once | ORAL | Status: AC
  Administered 2021-09-17: 40 meq via ORAL
  Filled 2021-09-17: qty 2

## 2021-09-17 MED ORDER — IPRATROPIUM-ALBUTEROL 0.5-2.5 (3) MG/3ML IN SOLN
3.0000 mL | Freq: Four times a day (QID) | RESPIRATORY_TRACT | Status: DC | PRN
  Administered 2021-09-20 – 2021-09-21 (×2): 3 mL via RESPIRATORY_TRACT
  Filled 2021-09-17 (×2): qty 3

## 2021-09-17 NOTE — Progress Notes (Signed)
[facility] Progress Note ? ?Patient: Jeremiah [name]    HBZ:[id number]  DOA: 09/12/2021    ? ?Date of Service: the patient was seen and examined on 09/17/2021 ? ?Chief Complaint  ?Patient presents with  ? Respiratory Distress  ? ?Brief hospital course: ? 71 yo male with a PMH of Severe End-Stage Emphysema who presented to [facility] ER on 05/8 from [facility] via EMS with shortness of breath.  Per ER notes EMS reported pt in severe respiratory distress, therefore he was placed on NRB with O2 sats only increasing to 82%.  He also received duoneb, albuterol, and magnesium sulfate. He was recently hospitalized from 04/21-05/4 with acute on chronic hypoxic/hypercapnic respiratory failure secondary to severe end-stage emphysema; sepsis due to pneumonia; and right sided pleural effusion requiring right thoracentesis 08/29/21 (body fluid culture, legionella culture reflex negative). ?  ?ED Course ?In the ER pt transitioned to Bipap due to continued respiratory distress.  CXR revealed large right pleural effusion with significant interval increase and CHF.  Lab results: Na+ 132, chloride 89, CO2 33, glucose 143, BUN 30, calcium 8.7, albumin 3.1, troponin 40, wbc 25.5, and hgb 11.2.  ABG: pH 7.23/pCO2 88/pO2 56/acid-base excess 6.1/bicarb 36.9.  Pt also hypotensive and tachycardic, sepsis protocol initiated.  Pt received 2L LR bolus, ceftriaxone, duoneb x2, and 125 mg iv solumedrol.  Due to continued hypotension levophed gtt initiated.  PCCM team contacted for ICU admission.   ? ? ?Assessment and Plan: ? ?Acute on chronic hypoxic hypercapnic respiratory failure secondary to recurrent right-sided pleural effusion due to metastatic adenocarcinoma and possible pneumonia  ?5/8 s/p Right thora, 1.5L fluid was tapped  ?5/9 s/p Right Thora 1.3L fluid was tapped  ?Severe end stage Emphysema  ?Hx: Left lower lobe pulmonary nodule and tobacco abuse  ?- Prn Bipap for dyspnea and/or hypoxia  ?- IV and nebulized steroids ?-  Scheduled and prn bronchodilator therapy  ?- Smoking cessation counseling provided  ?- Continue nicotine patch  ?-5/9 CXR Stable to slightly smaller right-sided pneumothorax. ?5/10 CT chest: right-sided hydropneumothorax, grossly unchanged by recent radiographs. The right pleural effusion has decreased. improved aeration of the right middle and lower lobes with persistent central bronchial occlusion and perihilar opacity which could reflect a mass, suboptimally evaluated without contrast. Correlation with thoracentesis results recommended. If inconclusive, bronchoscopy may be warranted. 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 ?5/10 CXR Moderate right-sided hydropneumothorax with stable gaseous component and increased fluid component 2. Increased size of the small left pleural effusion with increased consolidation in the bilateral lung bases. ?S/p IV Solu-Medrol 40 daily x3 doses given and started oral tapering prednisone ?Started Lasix 40 mg IV twice daily for diuresis ?Respiratory viral panel is pending ?5/12 CXR shows worsening of right-sided pleural effusion ?Patient decided to be DNR/DNI and hospice care, patient wants chest tube insertion for palliative comfort measures ?We will keep n.p.o. after midnight on Sunday for Pleurx chest tube insertion on Monday ?Discussed with IR ? ? ?Sepsis with septic shock  ?Mildly elevated troponin secondary to demand ischemia in the setting of acute respiratory failure  ?- Continuous telemetry monitoring  ?- s/p Prn levophed gtt to maintain map >65 ?- Hold outpatient antihypertensives ?  ? 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 ?Hypophosphatemia  ?- Trend  BMP  ?- Replace electrolytes as  indicated  ?- Monitor UOP ?  ?Pneumonia, ?- Trend WBC and monitor fever curve ?- Trend PCT  ?-Fluid culture and blood culture negative  ?- right-sided pleural fluid cytology shows metastatic adenocarcinoma ?- s/p cefepime and Zyvox d/c'd on 5/11 and started Bactrim as per pulm ? ? ?Chronic anemia  ?- Trend CBC ?- Transfuse for hgb <7 ?- Monitor s/sx of bleeding  ?- Will resume outpatient ferrous sulfate once able to take po medications  ?  ?GERD  ?- Continue PPI  ?  ?Severe protein calorie malnutrition  ?- Dietitian consulted appreciate input  ? ?Vitamin D deficiency, started vitamin D 50,000 units p.o. weekly, follow with PCP to repeat vitamin D level after 3 months. ? ?Body mass index is 25.28 kg/m?Marland Kitchen  ?Nutrition Problem: Severe Malnutrition ?Etiology: chronic illness (COPD, CHF) ?Interventions: ?  ? ?Pressure Injury 06/15/21 Coccyx Medial Stage 2 -  Partial thickness loss of dermis presenting as a shallow open injury with a red, pink wound bed without slough. pink, first layer gone (Active)  ?06/15/21 0800  ?Location: Coccyx  ?Location Orientation: Medial  ?Staging: Stage 2 -  Partial thickness loss of dermis presenting as a shallow open injury with a red, pink wound bed without slough.  ?Wound Description (Comments): pink, first layer gone  ?Present on Admission: Yes  ?   ?Pressure Injury 06/16/21 Buttocks Right Stage 2 -  Partial thickness loss of dermis presenting as a shallow open injury with a red, pink wound bed without slough. (Active)  ?06/16/21 2322  ?Location: Buttocks  ?Location Orientation: Right  ?Staging: Stage 2 -  Partial thickness loss of dermis presenting as a shallow open injury with a red, pink wound bed without slough.  ?Wound Description (Comments):   ?Present on Admission: Yes  ?  ? ?Diet: Heart healthy ?DVT Prophylaxis: Subcutaneous Lovenox  ? ?Advance goals of care discussion: Full code ? ?Family Communication: family was present at bedside, at the time of interview.  ?The pt provided  permission to discuss medical plan with the family. Opportunity was given to ask question and all questions were answered satisfactorily.  ? ?Disposition:  ?Pt is from SNF, admitted with recurrent pleural effusion and found to have possible right lung cancer, still has respiratory failure and possible pneumonia on IV antibiotics, which precludes a safe discharge. ?Discharge to SNF with hospice services, after Pleurx chest tube insertion on Monday by IR. ? ? ?Subjective: No significant overnight issues, patient is complaining of backache 9/10, requesting for stronger pain medication.  Breathing seems to be worsening, denied any chest pain or palpitation, no any other acute issues. ?Patient agreed for the Pleurx catheter on Monday and then discharged to hospice. ? ? ?Physical Exam: ?General:   NAD, mild SOB.  ?Appear in mild distress, affect appropriate ?Eyes: PERRLA ?ENT: Oral Mucosa Clear, moist  ?Neck: no JVD,  ?Cardiovascular: S1 and S2 Present, no Murmur,  ?Respiratory: increased respiratory effort, b/l basal Crackles, decreased breath sounds right lower lobe, no wheezes ?Abdomen: Bowel Sound present, Soft and no tenderness,  ?Skin: no rashes ?Extremities: 2+ Pedal edema, no calf tenderness ?Neurologic: without any new focal findings ?Gait not checked due to patient safety concerns ? ?Vitals:  ? 09/16/21 0819 09/16/21 1628 09/16/21 2013 09/16/21 2125  ?BP: (!) 91/55 124/79  101/60  ?Pulse: 99 (!) 109  (!) 107  ?Resp: 18   18  ?Temp: 97.9 ?F (36.6 ?C) 98 ?F (36.7 ?C)  98.2 ?F (36.8 ?C)  ?  TempSrc:  Oral  Oral  ?SpO2: 100% 100% 97% 98%  ?Weight:      ?Height:      ? ? ?Intake/Output Summary (Last 24 hours) at 09/17/2021 1227 ?Last data filed at 09/17/2021 0900 ?Gross per 24 hour  ?Intake 240 ml  ?Output 1501 ml  ?Net -1261 ml  ? ?Filed Weights  ? 09/14/21 0423 09/15/21 0617 09/16/21 0500  ?Weight: 71 kg 75 kg 73.2 kg  ? ? ?Data Reviewed: ?I have personally reviewed and interpreted daily labs, tele strips, imagings as  discussed above. ?I reviewed all nursing notes, pharmacy notes, vitals, pertinent old records ?I have discussed plan of care as described above with RN and patient/family. ? ?CBC: ?Recent Labs  ?Lab 09/12/21 ?1123 05/0

## 2021-09-17 NOTE — Progress Notes (Signed)
? ?NAME:  Jeremiah [name], MRN:  [id number], DOB:  02/14/1951, LOS: 5 ?ADMISSION DATE:  09/12/2021, CONSULTATION DATE: 09/12/2021 ?REFERRING MD: Dr, Cinda Quest, CHIEF COMPLAINT:  Shortness of Breath   ? ?History of Present Illness:  ?This is a 71 yo male with a PMH of Severe End-Stage Emphysema who presented to [facility] ER on 05/8 from [facility] via EMS with shortness of breath.  Per ER notes EMS reported pt in severe respiratory distress, therefore he was placed on NRB with O2 sats only increasing to 82%.  He also received duoneb, albuterol, and magnesium sulfate. He was recently hospitalized from 04/21-05/4 with acute on chronic hypoxic/hypercapnic respiratory failure secondary to severe end-stage emphysema; sepsis due to pneumonia; and right sided pleural effusion requiring right thoracentesis 08/29/21 (body fluid culture, legionella culture reflex negative). ? ?ED Course ?In the ER pt transitioned to Bipap due to continued respiratory distress.  CXR revealed large right pleural effusion with significant interval increase and CHF.  Lab results: Na+ 132, chloride 89, CO2 33, glucose 143, BUN 30, calcium 8.7, albumin 3.1, troponin 40, wbc 25.5, and hgb 11.2.  ABG: pH 7.23/pCO2 88/pO2 56/acid-base excess 6.1/bicarb 36.9.  Pt also hypotensive and tachycardic, sepsis protocol initiated.  Pt received 2L LR bolus, ceftriaxone, duoneb x2, and 125 mg iv solumedrol.  Due to continued hypotension levophed gtt initiated.  PCCM team contacted for ICU admission.   ? ?09/15/21- I met with patient and family at bedside.  We discussed his pleural effusion and pathology.  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  We reivewed palliative care options vs medical oncology evaluation. He wants to discuss options with oncologist.  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 ?09/16/21- patient with no acute events overnight. He is process of discussing code status. He had oncology evaluation  and is considering his medical wishes. No changes in his medical plan. ? ? ?09/17/21- patient evaluated at bedside. He is feeling better but does cough up phlegm. He has family at bedside and I was able to answer questions regarding his wishes and medical plan.  Brother in law Waverly and sister Arlana Pouch.  He is agreeable to permanent pleural cather for palliation.  ? ?Pertinent  Medical History  ?Asthma ?Severe End-stage Emphysema ?O2 Dependent  ?GERD ?HTN ?Multifocal Pneumonia  ?Sepsis ?Tobacco Abuse  ? ?Significant Hospital Events: ?Including procedures, antibiotic start and stop dates in addition to other pertinent events   ?05/8: Pt admitted to ICU with acute on chronic hypoxic hypercapnic respiratory failure secondary to recurrent right-sided pleural effusion and sepsis secondary to possible pneumonia requiring Bipap and levophed gtt  ?05/8: Right-sided thoracentesis performed per IR with removal of 1.5L of pleural fluid  ?05/9: Pt off levophed gtt, alert/oriented, on Bipap FiO2 28% will transition off Bipap ? ?Interim History / Subjective:  ? ?No acute events overnight ?Patient had repeat thoracentesis yesterday with 1.3L removed.  ? ?Objective   ?Blood pressure 101/60, pulse (!) 107, temperature 98.2 ?F (36.8 ?C), temperature source Oral, resp. rate 18, height 5' 7" (1.702 m), weight 73.2 kg, SpO2 98 %. ?   ?   ? ?Intake/Output Summary (Last 24 hours) at 09/17/2021 1258 ?Last data filed at 09/17/2021 0900 ?Gross per 24 hour  ?Intake 240 ml  ?Output 1501 ml  ?Net -1261 ml  ? ? ?Filed Weights  ? 09/14/21 0423 09/15/21 0617 09/16/21 0500  ?Weight: 71 kg 75 kg 73.2 kg  ? ? ?  Examination: ?General: frail cachectic male, no acute distress ?HEENT: sclera anictic, burn wounds on nostrils ?Neuro: following commands, moving all extremities ?CV: rrr, s1s2, no murmurs ?PULM: diminished breath sounds. Scattered rhonchi ?GI: soft, non-tender, non-distended, BS+ ?Extremities: warm, no edema ?Skin: sacral wounds  present on admission ? ?Assessment & Plan:  ?Acute on chronic hypoxic hypercapnic respiratory failure secondary to recurrent right-sided pleural effusion and possible pneumonia s/p thoracentesis 05/8 with removal of 1.5L and 5/9 with removal of 1.3L pleural fluid  ?Severe end stage Emphysema  ?Mediastinal and Hilar Adenopathy ?Possible Right Hilar Mass ?Hx: Left lower lobe pulmonary nodule and tobacco abuse  ?- Continue nebulizer treatments ?- Continue IV steroids ?- Prn Bipap for dyspnea and/or hypoxia  ?- IV and nebulized steroids ?- Smoking cessation counseling provided  ?- Continue nicotine patch  ?- CT Chest today shows concern for possible right perihilar mass. We are awaiting pleural fluid cytology results.  ?- Depending on pleural fluid cytology, he may require placement of pleurX catheter if he has malignant effusion.  ? ? ?Malignant pleural effusion  ?   - adenocarcinoma stage 4 ?   - goals of care addressed today  ?   - patient wishes to have oncology evaluation.  ?    -patient is in process of TPC ? ? ?Best Practice (right click and "Reselect all SmartList Selections" daily)  ? ?Per primary team ? ?Labs   ?CBC: ?Recent Labs  ?Lab 09/12/21 ?1123 09/13/21 ?0614 09/14/21 ?7858 09/15/21 ?0355 09/16/21 ?0458 09/17/21 ?8502  ?WBC 25.5* 24.2* 19.6* 16.4* 15.4* 13.3*  ?NEUTROABS 23.4*  --  17.2*  --   --   --   ?HGB 11.2* 9.2* 8.5* 8.5* 10.4* 9.4*  ?HCT 35.5* 29.0* 25.9* 25.6* 31.4* 29.3*  ?MCV 87.4 85.0 82.2 83.7 84.6 84.7  ?PLT 293 268 277 286 337 281  ? ? ? ?Basic Metabolic Panel: ?Recent Labs  ?Lab 09/13/21 ?0614 09/14/21 ?7741 09/15/21 ?0355 09/15/21 ?1110 09/16/21 ?0458 09/17/21 ?2878  ?NA 132* 133* 131* 129* 133* 134*  ?K 3.9 4.0 3.6 3.8 3.8 3.5  ?CL 93* 93* 93* 90* 91* 90*  ?CO2 35* 34* 35* 35* 38* 37*  ?GLUCOSE 112* 80 98 118* 88 109*  ?BUN 32* 27* _0 ?CREATININE 0.62 0.59* 0.53* 0.57* 0.51* 0.59*  ?CALCIUM 8.4* 7.9* 7.7* 8.0* 8.2* 7.7*  ?MG 1.9 1.8 1.8  --  1.7 2.0  ?PHOS 2.2* 1.9* 2.7  --   2.3* 3.8  ? ? ?GFR: ?Estimated Creatinine Clearance: 79.2 mL/min (A) (by C-G formula based on SCr of 0.59 mg/dL (L)). ?Recent Labs  ?Lab 09/12/21 ?1123 09/12/21 ?1255 09/12/21 ?1943 09/13/21 ?6767 09/14/21 ?2094 09/15/21 ?0355 09/16/21 ?0458 09/17/21 ?7096  ?PROCALCITON  --  0.11  --  0.13 <0.10  --   --   --   ?WBC 25.5*  --   --  24.2* 19.6* 16.4* 15.4* 13.3*  ?LATICACIDVEN 1.4  --  2.2*  --   --   --   --   --   ? ? ? ?Liver Function Tests: ?Recent Labs  ?Lab 09/12/21 ?1123 09/15/21 ?1110  ?AST 28 31  ?ALT 30 28  ?ALKPHOS 102 88  ?BILITOT 0.9 0.7  ?PROT 6.3* 5.5*  ?ALBUMIN 3.1* 2.6*  ? ? ?Recent Labs  ?Lab 09/15/21 ?1110  ?LIPASE 42  ? ? ?No results for input(s): AMMONIA in the last 168 hours. ? ?ABG ?   ?Component Value Date/Time  ? PHART 7.48 (H) 09/13/2021 0500  ?  PCO2ART 50 (H) 09/13/2021 0500  ? PO2ART 55 (L) 09/13/2021 0500  ? HCO3 37.2 (H) 09/13/2021 0500  ? ACIDBASEDEF 1.2 06/09/2020 2326  ? O2SAT 88.6 09/13/2021 0500  ? ?  ? ?Coagulation Profile: ?Recent Labs  ?Lab 09/12/21 ?1943  ?INR 1.0  ? ? ? ?Cardiac Enzymes: ?No results for input(s): CKTOTAL, CKMB, CKMBINDEX, TROPONINI in the last 168 hours. ? ?HbA1C: ?No results found for: HGBA1C ? ?CBG: ?Recent Labs  ?Lab 09/12/21 ?1543  ?GLUCAP 155*  ? ? ? ? ? ?Ottie Glazier, M.D.  ?Pulmonary & Critical Care Medicine  ?[place]  ? ? ? ?

## 2021-09-18 DIAGNOSIS — J9621 Acute and chronic respiratory failure with hypoxia: Secondary | ICD-10-CM | POA: Diagnosis not present

## 2021-09-18 DIAGNOSIS — J9622 Acute and chronic respiratory failure with hypercapnia: Secondary | ICD-10-CM | POA: Diagnosis not present

## 2021-09-18 LAB — BASIC METABOLIC PANEL
Anion gap: 6 (ref 5–15)
BUN: 22 mg/dL (ref 8–23)
CO2: 40 mmol/L — ABNORMAL HIGH (ref 22–32)
Calcium: 8.1 mg/dL — ABNORMAL LOW (ref 8.9–10.3)
Chloride: 86 mmol/L — ABNORMAL LOW (ref 98–111)
Creatinine, Ser: 0.59 mg/dL — ABNORMAL LOW (ref 0.61–1.24)
GFR, Estimated: >60 mL/min (ref >60)
Glucose, Bld: 98 mg/dL (ref 70–99)
Potassium: 4.6 mmol/L (ref 3.5–5.1)
Sodium: 132 mmol/L — ABNORMAL LOW (ref 135–145)

## 2021-09-18 LAB — MAGNESIUM: Magnesium: 1.6 mg/dL — ABNORMAL LOW (ref 1.7–2.4)

## 2021-09-18 LAB — CBC
HCT: 30.4 % — ABNORMAL LOW (ref 39.0–52.0)
Hemoglobin: 9.7 g/dL — ABNORMAL LOW (ref 13.0–17.0)
MCH: 27.2 pg (ref 26.0–34.0)
MCHC: 31.9 g/dL (ref 30.0–36.0)
MCV: 85.4 fL (ref 80.0–100.0)
Platelets: 372 10*3/uL (ref 150–400)
RBC: 3.56 MIL/uL — ABNORMAL LOW (ref 4.22–5.81)
RDW: 16.5 % — ABNORMAL HIGH (ref 11.5–15.5)
WBC: 15.3 10*3/uL — ABNORMAL HIGH (ref 4.0–10.5)
nRBC: 0 % (ref 0.0–0.2)

## 2021-09-18 LAB — PHOSPHORUS: Phosphorus: 2.4 mg/dL — ABNORMAL LOW (ref 2.5–4.6)

## 2021-09-18 MED ORDER — MAGNESIUM SULFATE 2 GM/50ML IV SOLN
2.0000 g | Freq: Once | INTRAVENOUS | Status: AC
  Administered 2021-09-18: 2 g via INTRAVENOUS
  Filled 2021-09-18: qty 50

## 2021-09-18 MED ORDER — OXYCODONE HCL 5 MG PO TABS
10.0000 mg | ORAL_TABLET | ORAL | Status: DC | PRN
  Administered 2021-09-18 – 2021-09-21 (×13): 10 mg via ORAL
  Filled 2021-09-18 (×13): qty 2

## 2021-09-18 MED ORDER — OXYCODONE HCL 5 MG PO TABS
5.0000 mg | ORAL_TABLET | ORAL | Status: AC
  Administered 2021-09-18: 5 mg via ORAL
  Filled 2021-09-18: qty 1

## 2021-09-18 MED ORDER — SODIUM PHOSPHATES 45 MMOLE/15ML IV SOLN
15.0000 mmol | Freq: Once | INTRAVENOUS | Status: AC
  Administered 2021-09-18: 15 mmol via INTRAVENOUS
  Filled 2021-09-18: qty 5

## 2021-09-18 NOTE — Plan of Care (Signed)
Pt AAOx4, moderate to severe back/lower chest pain. Plan for pain control. VS are stable. Bed is in lowest position, call light within reach. Will continue to monitor.  ?

## 2021-09-18 NOTE — Progress Notes (Signed)
Office manager note ? ?Follow upon referral at SNF pending discharge from the hospital.  Patient still has plans for Pleurx chest tube tomorrow.   ? ?Will continue to follow through final disposition.   ?Thank you for allowing participation in this patient's care. ? ?Dimas Aguas, RN ?[phone] ?

## 2021-09-18 NOTE — Progress Notes (Signed)
[facility] Progress Note ? ?Patient: Jeremiah [name]    HUD:[id number]  DOA: 09/12/2021    ? ?Date of Service: the patient was seen and examined on 09/18/2021 ? ?Chief Complaint  ?Patient presents with  ? Respiratory Distress  ? ?Brief hospital course: ? 71 yo male with a PMH of Severe End-Stage Emphysema who presented to [facility] ER on 05/8 from [facility] via EMS with shortness of breath.  Per ER notes EMS reported pt in severe respiratory distress, therefore he was placed on NRB with O2 sats only increasing to 82%.  He also received duoneb, albuterol, and magnesium sulfate. He was recently hospitalized from 04/21-05/4 with acute on chronic hypoxic/hypercapnic respiratory failure secondary to severe end-stage emphysema; sepsis due to pneumonia; and right sided pleural effusion requiring right thoracentesis 08/29/21 (body fluid culture, legionella culture reflex negative). ?  ?ED Course ?In the ER pt transitioned to Bipap due to continued respiratory distress.  CXR revealed large right pleural effusion with significant interval increase and CHF.  Lab results: Na+ 132, chloride 89, CO2 33, glucose 143, BUN 30, calcium 8.7, albumin 3.1, troponin 40, wbc 25.5, and hgb 11.2.  ABG: pH 7.23/pCO2 88/pO2 56/acid-base excess 6.1/bicarb 36.9.  Pt also hypotensive and tachycardic, sepsis protocol initiated.  Pt received 2L LR bolus, ceftriaxone, duoneb x2, and 125 mg iv solumedrol.  Due to continued hypotension levophed gtt initiated.  PCCM team contacted for ICU admission.   ? ? ?Assessment and Plan: ? ?Acute on chronic hypoxic hypercapnic respiratory failure secondary to recurrent right-sided pleural effusion due to metastatic adenocarcinoma and possible pneumonia  ?5/8 s/p Right thora, 1.5L fluid was tapped  ?5/9 s/p Right Thora 1.3L fluid was tapped  ?Severe end stage Emphysema  ?Hx: Left lower lobe pulmonary nodule and tobacco abuse  ?- Prn Bipap for dyspnea and/or hypoxia  ?- IV and nebulized steroids ?-  Scheduled and prn bronchodilator therapy  ?- Smoking cessation counseling provided  ?- Continue nicotine patch  ?-5/9 CXR Stable to slightly smaller right-sided pneumothorax. ?5/10 CT chest: right-sided hydropneumothorax, grossly unchanged by recent radiographs. The right pleural effusion has decreased. improved aeration of the right middle and lower lobes with persistent central bronchial occlusion and perihilar opacity which could reflect a mass, suboptimally evaluated without contrast. Correlation with thoracentesis results recommended. If inconclusive, bronchoscopy may be warranted. 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 ?5/10 CXR Moderate right-sided hydropneumothorax with stable gaseous component and increased fluid component 2. Increased size of the small left pleural effusion with increased consolidation in the bilateral lung bases. ?S/p IV Solu-Medrol 40 daily x3 doses given and started oral tapering prednisone ?Started Lasix 40 mg IV twice daily for diuresis ?Respiratory viral panel is pending ?5/12 CXR shows worsening of right-sided pleural effusion ?Patient decided to be DNR/DNI and hospice care, patient wants chest tube insertion for palliative comfort measures ?keep n.p.o. after midnight for Pleurx chest tube insertion on Monday ?Discussed with IR and left the message to [facility] radiology today as a reminder ? ? ?Sepsis with septic shock  ?Mildly elevated troponin secondary to demand ischemia in the setting of acute respiratory failure  ?- Continuous telemetry monitoring  ?- s/p Prn levophed gtt to maintain map >65 ?- Hold outpatient antihypertensives ?  ? 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 ?Hypophosphatemia  ?-  Trend BMP  ?- Replace electrolytes as indicated  ?- Monitor UOP ?  ?Hypomagnesemia, mag repleted.  Monitor and replete as needed. ? ?Pneumonia, ?- Trend WBC and monitor fever curve ?- Trend PCT  ?-Fluid culture and blood culture negative  ?- right-sided pleural fluid cytology shows metastatic adenocarcinoma ?- s/p cefepime and Zyvox d/c'd on 5/11 and started Bactrim as per pulm ? ? ?Chronic anemia  ?- Trend CBC ?- Transfuse for hgb <7 ?- Monitor s/sx of bleeding  ?- Will resume outpatient ferrous sulfate once able to take po medications  ?  ?GERD  ?- Continue PPI  ?  ?Severe protein calorie malnutrition  ?- Dietitian consulted appreciate input  ? ?Vitamin D deficiency, started vitamin D 50,000 units p.o. weekly, follow with PCP to repeat vitamin D level after 3 months. ? ?Body mass index is 25.28 kg/m?Marland Kitchen  ?Nutrition Problem: Severe Malnutrition ?Etiology: chronic illness (COPD, CHF) ?Interventions: ?  ? ?Pressure Injury 06/15/21 Coccyx Medial Stage 2 -  Partial thickness loss of dermis presenting as a shallow open injury with a red, pink wound bed without slough. pink, first layer gone (Active)  ?06/15/21 0800  ?Location: Coccyx  ?Location Orientation: Medial  ?Staging: Stage 2 -  Partial thickness loss of dermis presenting as a shallow open injury with a red, pink wound bed without slough.  ?Wound Description (Comments): pink, first layer gone  ?Present on Admission: Yes  ?   ?Pressure Injury 06/16/21 Buttocks Right Stage 2 -  Partial thickness loss of dermis presenting as a shallow open injury with a red, pink wound bed without slough. (Active)  ?06/16/21 2322  ?Location: Buttocks  ?Location Orientation: Right  ?Staging: Stage 2 -  Partial thickness loss of dermis presenting as a shallow open injury with a red, pink wound bed without slough.  ?Wound Description (Comments):   ?Present on Admission: Yes  ?  ? ?Diet: Heart healthy ?DVT Prophylaxis: Subcutaneous Lovenox  ? ?Advance goals of care discussion: Full  code ? ?Family Communication: family was present at bedside, at the time of interview.  ?The pt provided permission to discuss medical plan with the family. Opportunity was given to ask question and all questions were answered satisfactorily.  ? ?Disposition:  ?Pt is from SNF, admitted with recurrent pleural effusion and found to have possible right lung cancer, still has respiratory failure and possible pneumonia on IV antibiotics, which precludes a safe discharge. ?Discharge to SNF with hospice services, after Pleurx chest tube insertion on Monday by IR. ? ? ?Subjective: No significant overnight issues, patient said that his back pain is well controlled today with oxycodone.  Patient still has worsening of shortness of breath, agreed for Pleurx catheter insertion tomorrow a.m.  And then discharge to hospice.   ?Denied any chest pain, no abdominal pain, no nausea vomiting or diarrhea. ? ? ?Physical Exam: ?General:   moderate respiratory distress ?Appear in mild distress, affect appropriate ?Eyes: PERRLA ?ENT: Oral Mucosa Clear, moist  ?Neck: no JVD,  ?Cardiovascular: S1 and S2 Present, no Murmur,  ?Respiratory: increased respiratory effort, b/l basal Crackles, decreased breath sounds right side due to effusion, no wheezes ?Abdomen: Bowel Sound present, Soft and no tenderness,  ?Skin: no rashes ?Extremities: 2+ Pedal edema, no calf tenderness ?Neurologic: without any new focal findings ?Gait not checked due to patient safety concerns ? ?Vitals:  ? 09/17/21 2001 09/17/21 2016 09/18/21 0512 09/18/21 0830  ?BP:  (!) 96/55 115/64 (!) 99/59  ?Pulse:  100 99 78  ?Resp:  20 20 18   ?Temp:  98.9 ?F (37.2 ?C) 98.1 ?F (36.7 ?C) 97.9 ?F (36.6 ?C)  ?TempSrc:  Oral Oral   ?SpO2: 100% 99% 98% 90%  ?Weight:      ?Height:      ? ? ?Intake/Output Summary (Last 24 hours) at 09/18/2021 1240 ?Last data filed at 09/18/2021 1009 ?Gross per 24 hour  ?Intake 832 ml  ?Output 900 ml  ?Net -68 ml  ? ?Filed Weights  ? 09/14/21 0423 09/15/21 0617  09/16/21 0500  ?Weight: 71 kg 75 kg 73.2 kg  ? ? ?Data Reviewed: ?I have personally reviewed and interpreted daily labs, tele strips, imagings as discussed above. ?I reviewed all nursing notes, pharmacy notes, v

## 2021-09-18 NOTE — Progress Notes (Signed)
PT Cancellation Note ? ?Patient Details ?Name: Jeremiah [name] ?MRN: [id number] ?DOB: July 09, 1950 ? ? ?Cancelled Treatment:    Reason Eval/Treat Not Completed: Other (comment) (Per chart, POC has changed, pt now opting for comfort measures and chest tube placement Monday. Will signoff at this time.) ? ?2:31 PM, 09/18/21 ?Etta Grandchild, PT, DPT ?Physical Therapist - [place] ?[facility]  ?[phone] (ASCOM)  ? ? ?Lorrena Goranson C ?09/18/2021, 2:31 PM ?

## 2021-09-19 ENCOUNTER — Inpatient Hospital Stay: Payer: Medicare HMO | Admitting: Radiology

## 2021-09-19 ENCOUNTER — Encounter: Payer: Self-pay | Admitting: Pulmonary Disease

## 2021-09-19 DIAGNOSIS — J9621 Acute and chronic respiratory failure with hypoxia: Secondary | ICD-10-CM | POA: Diagnosis not present

## 2021-09-19 DIAGNOSIS — J9622 Acute and chronic respiratory failure with hypercapnia: Secondary | ICD-10-CM | POA: Diagnosis not present

## 2021-09-19 HISTORY — PX: IR GUIDED DRAIN W CATHETER PLACEMENT: IMG719

## 2021-09-19 LAB — CBC
HCT: 30.3 % — ABNORMAL LOW (ref 39.0–52.0)
Hemoglobin: 9.7 g/dL — ABNORMAL LOW (ref 13.0–17.0)
MCH: 27.7 pg (ref 26.0–34.0)
MCHC: 32 g/dL (ref 30.0–36.0)
MCV: 86.6 fL (ref 80.0–100.0)
Platelets: 345 10*3/uL (ref 150–400)
RBC: 3.5 MIL/uL — ABNORMAL LOW (ref 4.22–5.81)
RDW: 16.6 % — ABNORMAL HIGH (ref 11.5–15.5)
WBC: 12.5 10*3/uL — ABNORMAL HIGH (ref 4.0–10.5)
nRBC: 0 % (ref 0.0–0.2)

## 2021-09-19 LAB — BASIC METABOLIC PANEL
Anion gap: 9 (ref 5–15)
BUN: 25 mg/dL — ABNORMAL HIGH (ref 8–23)
CO2: 43 mmol/L — ABNORMAL HIGH (ref 22–32)
Calcium: 8.3 mg/dL — ABNORMAL LOW (ref 8.9–10.3)
Chloride: 82 mmol/L — ABNORMAL LOW (ref 98–111)
Creatinine, Ser: 0.59 mg/dL — ABNORMAL LOW (ref 0.61–1.24)
GFR, Estimated: >60 mL/min (ref >60)
Glucose, Bld: 92 mg/dL (ref 70–99)
Potassium: 5 mmol/L (ref 3.5–5.1)
Sodium: 134 mmol/L — ABNORMAL LOW (ref 135–145)

## 2021-09-19 LAB — PHOSPHORUS: Phosphorus: 2.6 mg/dL (ref 2.5–4.6)

## 2021-09-19 LAB — MAGNESIUM: Magnesium: 2 mg/dL (ref 1.7–2.4)

## 2021-09-19 MED ORDER — MIDAZOLAM HCL 2 MG/2ML IJ SOLN
INTRAMUSCULAR | Status: AC
  Filled 2021-09-19: qty 2

## 2021-09-19 MED ORDER — LIDOCAINE HCL (PF) 1 % IJ SOLN
15.0000 mL | Freq: Once | INTRAMUSCULAR | Status: DC
  Filled 2021-09-19: qty 15

## 2021-09-19 MED ORDER — SULFAMETHOXAZOLE-TRIMETHOPRIM 400-80 MG PO TABS
1.0000 | ORAL_TABLET | Freq: Two times a day (BID) | ORAL | Status: AC
  Administered 2021-09-19 – 2021-09-20 (×2): 1 via ORAL
  Filled 2021-09-19 (×2): qty 1

## 2021-09-19 MED ORDER — VANCOMYCIN HCL 500 MG/100ML IV SOLN
INTRAVENOUS | Status: AC | PRN
Start: 2021-09-19 — End: 2021-09-19
  Administered 2021-09-19: 1000 mg via INTRAVENOUS

## 2021-09-19 MED ORDER — FENTANYL CITRATE (PF) 100 MCG/2ML IJ SOLN
INTRAMUSCULAR | Status: AC | PRN
  Administered 2021-09-19: 25 ug via INTRAVENOUS

## 2021-09-19 MED ORDER — SODIUM CHLORIDE 0.9 % IV BOLUS
500.0000 mL | Freq: Once | INTRAVENOUS | Status: AC
  Administered 2021-09-19: 500 mL via INTRAVENOUS

## 2021-09-19 MED ORDER — VANCOMYCIN HCL IN DEXTROSE 1-5 GM/200ML-% IV SOLN
1000.0000 mg | INTRAVENOUS | Status: AC
  Filled 2021-09-19 (×2): qty 200

## 2021-09-19 MED ORDER — FENTANYL CITRATE (PF) 100 MCG/2ML IJ SOLN
INTRAMUSCULAR | Status: AC
  Filled 2021-09-19: qty 2

## 2021-09-19 NOTE — Progress Notes (Signed)
[facility] Liaison note: ? ?Follow up visit made to new referral for [facility] hospice services at [facility]. ?Patient was agitated with current plan, friend [name] at bedside. Discussed services at [facility] and at home.After writer had left patient and his friend decided that he would discharge instead, to her home with hospice services.  Address is correct on hospital facesheet. ?Probation officer spoke again with [name], DME needs discussed. DME includes hospital bed, over bed table and oxygen change out. ?She has request DME be delivered after 12 pm tomorrow.  ?Hospital care team updated and aware of change. ? ?Patient will require non emergent EMS transport at discharge. ? ?Please have signed out of facility DNR and prescriptions for comfort medications ready at discharge. ? ?Liaison to follow through discharge. ? ?Thank you for the opportunity to be involved in the care of this patient and his caregiver. ? ?Flo Shanks BSN, RN, [facility] ?Hospital Liaison ?Manufacturing engineer ?[phone] ? ?

## 2021-09-19 NOTE — Care Management Important Message (Signed)
Important Message ? ?Patient Details  ?Name: Jeremiah [name] ?MRN: [id number] ?Date of Birth: Apr 13, 1951 ? ? ?Medicare Important Message Given:  Other (see comment) ? ?Disposition to discharge with hospice services.  Medicare IM withheld at this time. ? ? ?Dannette Barbara ?09/19/2021, 8:12 AM ?

## 2021-09-19 NOTE — Consult Note (Signed)
? ?Chief Complaint: ?Patient was seen in consultation today for recurrent right sided malignant pleural effusion at the request of Val Riles, MD ? ?Referring Physician(s): ?Val Riles, MD ? ?Supervising Physician: Juliet Rude ? ?Patient Status: [name] - In-pt ? ?History of Present Illness: ?Jeremiah [name] is a 71 y.o. male with PMHx significant for acute on chronic respiratory failure with end stage emphysema who presented to ED on 5/8 with shortness of breath. 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 The patient has been seen by pulmonary, oncology and palliative. Request received for image guided tunneled pleural pleurX catheter placement for comfort measures after discharge. The patient has no known complications to sedation. He states he wishes to proceed with catheter placement today. ? ? ?Past Medical History:  ?Diagnosis Date  ? Acute and chronic respiratory failure (acute-on-chronic) ([place]) 10/18/2020  ? Asthma   ? COPD (chronic obstructive pulmonary disease) ([place])   ? O2 dependent - 2L  ? GERD (gastroesophageal reflux disease)   ? HTN (hypertension)   ? Multifocal pneumonia 06/16/2020  ? Other emphysema ([place]) 08/22/2016  ? Oxygen dependent   ? Sepsis ([place]) 05/14/2020  ? Tobacco dependence   ? ? ?Past Surgical History:  ?Procedure Laterality Date  ? CATARACT EXTRACTION W/PHACO Left 12/09/2019  ? Procedure: CATARACT EXTRACTION PHACO AND INTRAOCULAR LENS PLACEMENT (IOC) COMPLICATED LEFT 50.93 26:71.2;  Surgeon: Birder Robson, MD;  Location: [place];  Service: Ophthalmology;  Laterality: Left;  [place]  ? CATARACT EXTRACTION W/PHACO Right 01/27/2020  ? Procedure: CATARACT EXTRACTION PHACO AND INTRAOCULAR LENS PLACEMENT ([place]) RIGHT VISION BLUE 26.83 02:07.6;  Surgeon: Birder Robson, MD;  Location: [place];  Service:  Ophthalmology;  Laterality: Right;  ? HERNIA REPAIR    ? IR THORACENTESIS ASP PLEURAL SPACE W/IMG GUIDE  08/29/2021  ? NECK SURGERY    ? ? ?Allergies: ?Penicillins ? ?Medications: ?Prior to Admission medications   ?Medication Sig Start Date End Date Taking? Authorizing Provider  ?albuterol (VENTOLIN HFA) 108 (90 Base) MCG/ACT inhaler Inhale into the lungs every 6 (six) hours as needed for wheezing or shortness of breath.   Yes [provider]  ?atorvastatin (LIPITOR) 40 MG tablet Take 40 mg by mouth daily. 07/23/21  Yes [provider]  ?DULoxetine (CYMBALTA) 30 MG capsule Take 30 mg by mouth daily.   Yes [provider]  ?ferrous sulfate 325 (65 FE) MG tablet Take 1 tablet (325 mg total) by mouth daily. 07/05/21 09/12/21 Yes Sharen Hones, MD  ?Keenan Bachelor ELLIPTA 62.5 MCG/ACT AEPB Inhale 1 puff into the lungs daily. 09/08/21  Yes [provider]  ?lisinopril (ZESTRIL) 5 MG tablet Take 1 tablet (5 mg total) by mouth daily. 09/08/21  Yes Fritzi Mandes, MD  ?montelukast (SINGULAIR) 10 MG tablet Take 1 tablet (10 mg total) by mouth at bedtime. 09/30/20  Yes Jennye Boroughs, MD  ?Multiple Vitamin (MULTIVITAMIN WITH MINERALS) TABS tablet Take 1 tablet by mouth daily. 09/08/21  Yes Fritzi Mandes, MD  ?nicotine (NICODERM CQ - DOSED IN MG/24 HOURS) 21 mg/24hr patch Place 1 patch (21 mg total) onto the skin daily. 07/05/21  Yes Sharen Hones, MD  ?pantoprazole (PROTONIX) 20 MG tablet Take 1 tablet (20 mg total) by mouth daily. 09/30/20  Yes Jennye Boroughs, MD  ?predniSONE (DELTASONE) 20 MG tablet Take 1 tablet (20 mg total) by mouth daily with breakfast. 09/08/21  Yes Fritzi Mandes, MD  ?  tamsulosin (FLOMAX) 0.4 MG CAPS capsule Take 1 capsule (0.4 mg total) by mouth daily. 09/08/21  Yes Fritzi Mandes, MD  ?acetaminophen (TYLENOL) 325 MG tablet Take 2 tablets (650 mg total) by mouth every 6 (six) hours as needed for mild pain (or Fever >/= 101). 08/06/15   Dustin Flock, MD  ?Memory Dance ELLIPTA 100-25 MCG/INH AEPB Inhale 1 puff  into the lungs daily. 09/30/20   Jennye Boroughs, MD  ?furosemide (LASIX) 20 MG tablet Take 0.5 tablets (10 mg total) by mouth daily as needed for edema. 06/24/21   Eugenie Filler, MD  ?guaiFENesin-dextromethorphan [facility] DM) 100-10 MG/5ML syrup Take 5 mLs by mouth every 6 (six) hours as needed for cough.    [provider]  ?ipratropium-albuterol (DUONEB) 0.5-2.5 (3) MG/3ML SOLN Take 3 mLs by nebulization every 6 (six) hours as needed. 09/30/20   Jennye Boroughs, MD  ?mupirocin ointment (BACTROBAN) 2 % Apply 1 application topically 2 (two) times daily.    [provider]  ?QUEtiapine (SEROQUEL) 50 MG tablet Take 1.5 tablets (75 mg total) by mouth at bedtime. 09/08/21   Fritzi Mandes, MD  ?tiotropium (SPIRIVA) 18 MCG inhalation capsule Place 18 mcg into inhaler and inhale daily.    [provider]  ?  ? ?Family History  ?Problem Relation Age of Onset  ? Cancer - Colon Father   ? Congestive Heart Failure Mother   ? ? ?Social History  ? ?Socioeconomic History  ? Marital status: Divorced  ?  Spouse name: Not on file  ? Number of children: Not on file  ? Years of education: Not on file  ? Highest education level: Not on file  ?Occupational History  ? Not on file  ?Tobacco Use  ? Smoking status: Every Day  ?  Packs/day: 0.50  ?  Years: 50.00  ?  Pack years: 25.00  ?  Types: Cigarettes  ?  Last attempt to quit: 07/28/2015  ?  Years since quitting: 6.1  ? Smokeless tobacco: Never  ? Tobacco comments:  ?  started age 22.  ?Vaping Use  ? Vaping Use: Never used  ?Substance and Sexual Activity  ? Alcohol use: Yes  ?  Alcohol/week: 0.0 standard drinks  ?  Comment: beers occasionally  ? Drug use: No  ? Sexual activity: Not on file  ?Other Topics Concern  ? Not on file  ?Social History Narrative  ? Lives at home with friend; anette martin/ her boy friend. Smoker; no alcohol. Used to works in [employer]. Goes arouund with walker/at home most of the times. Daughter- not in touch.   ? ?Social Determinants of  Health  ? ?Financial Resource Strain: Not on file  ?Food Insecurity: Not on file  ?Transportation Needs: Not on file  ?Physical Activity: Not on file  ?Stress: Not on file  ?Social Connections: Not on file  ? ?Review of Systems: A 12 point ROS discussed and pertinent positives are indicated in the HPI above.  All other systems are negative. ? ?Review of Systems ? ?Vital Signs: ?BP 116/62 (BP Location: Right Arm)   Pulse 100   Temp 98.1 ?F (36.7 ?C)   Resp 16   Ht 5\' 7"  (1.702 m)   Wt 161 lb 6 oz (73.2 kg)   SpO2 96%   BMI 25.28 kg/m?  ? ?Physical Exam ?Constitutional:   ?   Appearance: He is ill-appearing.  ?Cardiovascular:  ?   Rate and Rhythm: Regular rhythm. Tachycardia present.  ?Pulmonary:  ?   Effort:  Pulmonary effort is normal. No respiratory distress.  ?Skin: ?   General: Skin is warm and dry.  ?Neurological:  ?   Mental Status: He is alert and oriented to person, place, and time.  ? ? ?Imaging: ?CT CHEST WO CONTRAST ? ?Result Date: 09/14/2021 ?CLINICAL DATA:  Pleural effusion, malignancy suspected. Respiratory distress with hypoxemia. * Tracking Code: BO * EXAM: CT CHEST WITHOUT CONTRAST TECHNIQUE: Multidetector CT imaging of the chest was performed following the standard protocol without IV contrast. RADIATION DOSE REDUCTION: This exam was performed according to the departmental dose-optimization program which includes automated exposure control, adjustment of the mA and/or kV according to patient size and/or use of iterative reconstruction technique. COMPARISON:  Chest CT 08/26/2021 and 05/28/2021. Recent chest radiographs. FINDINGS: Cardiovascular: Again demonstrated is diffuse atherosclerosis of the aorta, great vessels and coronary arteries. No acute vascular findings are seen on noncontrast imaging. Stable anterior pericardial fluid. The heart size is normal. Mediastinum/Nodes: Previously demonstrated right hilar adenopathy remains largely obscured by right perihilar airspace disease. Mildly  prominent mediastinal lymph nodes are unchanged. No axillary adenopathy. The thyroid gland, trachea and esophagus demonstrate no significant findings. Lungs/Pleura: The right pleural effusion has decreased in volume follow

## 2021-09-19 NOTE — TOC Progression Note (Signed)
Transition of Care (TOC) - Progression Note  ? ? ?Patient Details  ?Name: Jeremiah [name] ?MRN: [id number] ?Date of Birth: May 09, 1950 ? ?Transition of Care (TOC) CM/SW Contact  ?Alberteen Sam, LCSW ?Phone Number: ?09/19/2021, 10:50 AM ? ?Clinical Narrative:    ? ? ?CSW spoke with Neoma Laming at [facility] who reports she ran patient's Medicaid in which he does not have long term medicaid. If patient were to return to them with hospice, hospice would go under regular medicare which his human would change to, and he would utilize medicaid for room and board. Since he does not have long term medicaid, room and board for 30 days would be over 7k.  ? ?CSW informed above to family, they report they will take patient home with hospice. Would like for when patient qualifies for hospice home for him to transition there. Above information relayed to treatment team.  ? ?Plan for pleural effusion today, dc tomorrow home with [facility].  ? ?Expected Discharge Plan: [place] ?Barriers to Discharge: Continued Medical Work up ? ?Expected Discharge Plan and Services ?Expected Discharge Plan: [place] ?  ?  ?  ?  ?                ?  ?  ?  ?  ?  ?  ?  ?  ?  ?  ? ? ?Social Determinants of Health (SDOH) Interventions ?  ? ?Readmission Risk Interventions ? ?  08/29/2021  ?  2:00 PM 10/21/2020  ?  1:14 PM 09/27/2020  ? 11:18 AM  ?Readmission Risk Prevention Plan  ?Transportation Screening Complete Complete Complete  ?PCP or Specialist Appt within 3-5 Days  Complete Complete  ?[place] or Home Care Consult  Complete Complete  ?Social Work Consult for [place] Planning/Counseling  Complete Not Complete  ?SW consult not completed comments   RNCM assigned to patient  ?Palliative Care Screening  Not Applicable Not Applicable  ?Medication Review Press photographer) Complete Complete Complete  ?PCP or Specialist appointment within 3-5 days of discharge Complete    ?[place] or Home Care Consult Complete    ?SW Recovery  Care/Counseling Consult Complete    ?Palliative Care Screening Not Applicable    ?[place] Not Complete    ?SNF Comments waiting on PT recomendation    ? ? ?

## 2021-09-19 NOTE — Procedures (Signed)
Interventional Radiology Procedure Note ? ?Date of Procedure: 09/19/2021  ?Procedure: PleurX placement  ? ?Findings:  ?1. PleurX placement, right chest  ?2. 1.2L removed after placement   ? ?Complications: No immediate complications noted.  ? ?Estimated Blood Loss: minimal ? ?Follow-up and Recommendations: ?1. Refer to EMR for use  ? ? ?Albin Felling, MD  ?Vascular & Interventional Radiology  ?09/19/2021 1:04 PM ? ? ? ?

## 2021-09-19 NOTE — Progress Notes (Signed)
?PROGRESS NOTE ? ? ? ?Jeremiah [name]  QQV:[id number] DOB: January 11, 1951 DOA: 09/12/2021 ? ?PCP: [place]  ? ?Brief Narrative:  ?This 71 yrs old male with PMH significant of  End-Stage Emphysema who presented to [facility] ED on 09/12/21 from [facility] via EMS with shortness of breath.  Per ER notes EMS reported patient was in severe respiratory distress, therefore he was placed on NRB with O2 sats only increasing to 82%.  He also received duoneb, albuterol, and magnesium sulfate. He was recently hospitalized from 04/21-05/4 with acute on chronic hypoxic / hypercapnic respiratory failure secondary to severe end-stage emphysema; sepsis due to pneumonia; and right sided pleural effusion requiring right thoracentesis on 08/29/21 (body fluid culture, legionella culture reflex negative). ?  ?ED Course ?In the ED Pt. was transitioned to Bipap due to continued respiratory distress.  CXR revealed large right pleural effusion with significant interval increase and CHF.  ABG: pH 7.23/pCO2 88/pO2 56/acid-base excess 6.1/bicarb 36.9.  Pt was also hypotensive and tachycardic, sepsis protocol initiated.  Pt received 2L LR bolus, ceftriaxone, duoneb x 2, and 125 mg iv solumedrol.  Due to continued hypotension levophed gtt initiated.  PCCM team contacted for ICU admission.  ? ?Patient admitted for acute on chronic hypoxic and hypercapnic respiratory failure secondary to recurrent pleural effusion due to metastatic adenocarcinoma. ?Fluid cytology confirms adenocarcinoma of the lung.  Plan is patient is going to get Pleurx catheter and home with home hospice bilateral care. ? ?Assessment & Plan: ?  ?Principal Problem: ?  Acute on chronic respiratory failure ([place]) ?Active Problems: ?  Pleural effusion, malignant ? ? ?Acute on chronic hypoxic and hypercapnic respiratory failure: ?Recurrent right pleural effusion due to metastatic adenocarcinoma and possible pneumonia: ?Patient presented with moderate respiratory  distress requiring nonrebreather and BiPAP on arrival. ?He underwent Right thoracocentesis on 09/12/21  and on 09/13/21.> 1.5 L  and 1.3 L drained. ?Continue PRN BIPAP for dyspnea and/or hypoxia  ?Continue IV and nebulized steroids ?- Scheduled and prn bronchodilator therapy  ?- Smoking cessation counseling provided  ?- Continue nicotine patch  ?5/10: CT chest: right-sided hydropneumothorax, grossly unchanged by recent radiographs. The right pleural effusion has decreased. improved aeration of the right middle and lower lobes with persistent central bronchial occlusion and perihilar opacity which could reflect a mass, suboptimally evaluated without contrast. Correlation with thoracentesis results recommended. If inconclusive, bronchoscopy may be warranted. ?08/29/21 pleural fluid cytology positive for METASTATIC ADENOCARCINOMA  ?5/8 cytology METASTATIC ADENOCARCINOMA ?5/10 informed to oncologist and pulmonologist as elevated as well. ?5/10 CXR Moderate right-sided hydropneumothorax with stable gaseous component and increased fluid component 2. Increased size of the small left pleural effusion with increased consolidation in the bilateral lung bases. ?S/p IV Solu-Medrol 40 daily x3 doses given and started oral tapering prednisone ?Started Lasix 40 mg IV twice daily for diuresis ?5/12 CXR shows worsening of right-sided pleural effusion ?Patient decided to be DNR/DNI and hospice care, patient wants chest tube insertion for palliative comfort measures. ?Kept NPO after midnight for Pleurx chest tube insertion today. ?  ?Sepsis with septic shock: ?Mildly elevated troponin secondary to demand ischemia in the setting of acute respiratory failure  ?Continue telemetry. ?s/p Prn levophed gtt to maintain map >65 ?Hold outpatient antihypertensives ?  ?  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 ?Serum osmolality 274 ?Continue fluid restriction 1.5 L/day ?Continue monitor sodium level daily ?Na 134  improved. ?  ?Hypophosphatemia  ?- Trend BMP  ?-Replaced and resolved. ? ?  Hypomagnesemia, mag repleted.  Monitor and replete as needed. ?  ?Pneumonia, ?- Trend WBC and monitor fever curve ?Procalcitonin normal. ?-Fluid culture and blood culture negative  ?- right-sided pleural fluid cytology shows metastatic adenocarcinoma ?- s/p cefepime and Zyvox d/c'd on 5/11 and started Bactrim as per pulm. ?  ?  ?Chronic anemia : ?- Trend CBC ?- Transfuse for hgb <7 ?-No signs of any obvious bleeding. ?  ?GERD  ?- Continue PPI  ?  ?Severe protein calorie malnutrition  ?- Dietitian consulted appreciate input  ?  ?Vitamin D deficiency, started vitamin D 50,000 units p.o. weekly, follow with PCP to repeat vitamin D level after 3 months. ?  ?Body mass index is 25.28 kg/m?Marland Kitchen  ?Nutrition Problem: Severe Malnutrition ?Etiology: chronic illness (COPD, CHF) ?Interventions: ?  ?Pressure Injury 06/15/21 Coccyx Medial Stage 2 -  Partial thickness loss of dermis presenting as a shallow open injury with a red, pink wound bed without slough. pink, first layer gone (Active)  ?06/15/21 0800  ?Location: Coccyx  ?Location Orientation: Medial  ?Staging: Stage 2 -  Partial thickness loss of dermis presenting as a shallow open injury with a red, pink wound bed without slough.  ?Wound Description (Comments): pink, first layer gone  ?Present on Admission: Yes  ?   ?Pressure Injury 06/16/21 Buttocks Right Stage 2 -  Partial thickness loss of dermis presenting as a shallow open injury with a red, pink wound bed without slough. (Active)  ?06/16/21 2322  ?Location: Buttocks  ?Location Orientation: Right  ?Staging: Stage 2 -  Partial thickness loss of dermis presenting as a shallow open injury with a red, pink wound bed without slough.  ?Wound Description (Comments):   ?Present on Admission: Yes  ?  ? ? ? ?DVT prophylaxis: Lovenox ?Code Status: DNR/DNI ?Family Communication: No family at bedside. ?Disposition Plan:  ?Pt is from SNF, admitted with recurrent  pleural effusion and found to have possible right lung cancer, still has respiratory failure and possible pneumonia on IV antibiotics, which precludes a safe discharge. ?Discharge home with hospice services, after Pleurx chest tube insertion today by IR. ?  ? ? ?Consultants:  ?PCCM, pulmonology, IR ? ?Procedures: Scheduled Pleurx catheter today ? ?Antimicrobials:   ?Anti-infectives (From admission, onward)  ? ? Start     Dose/Rate Route Frequency Ordered Stop  ? 09/19/21 2200  sulfamethoxazole-trimethoprim (BACTRIM) 400-80 MG per tablet 1 tablet       ? 1 tablet Oral Every 12 hours 09/19/21 1026 09/20/21 2159  ? 09/15/21 1500  sulfamethoxazole-trimethoprim (BACTRIM) 400-80 MG per tablet 1 tablet  Status:  Discontinued       ? 1 tablet Oral Every 12 hours 09/15/21 1411 09/19/21 1026  ? 09/13/21 1900  azithromycin (ZITHROMAX) 250 mg in dextrose 5 % 125 mL IVPB  Status:  Discontinued       ?See Hyperspace for full Linked Orders Report.  ? 250 mg ?127.5 mL/hr over 60 Minutes Intravenous Every 24 hours 09/12/21 1825 09/14/21 1226  ? 09/13/21 0000  azithromycin (ZITHROMAX) 250 mg in dextrose 5 % 125 mL IVPB  Status:  Discontinued       ?See Hyperspace for full Linked Orders Report.  ? 250 mg ?127.5 mL/hr over 60 Minutes Intravenous Every 24 hours 09/12/21 1824 09/12/21 1825  ? 09/12/21 2000  ceFEPIme (MAXIPIME) 2 g in sodium chloride 0.9 % 100 mL IVPB  Status:  Discontinued       ? 2 g ?200 mL/hr over 30 Minutes Intravenous Every 8  hours 09/12/21 1914 09/15/21 1411  ? 09/12/21 1915  azithromycin (ZITHROMAX) 500 mg in sodium chloride 0.9 % 250 mL IVPB  Status:  Discontinued       ?See Hyperspace for full Linked Orders Report.  ? 500 mg ?250 mL/hr over 60 Minutes Intravenous Every 24 hours 09/12/21 1824 09/12/21 1825  ? 09/12/21 1900  linezolid (ZYVOX) IVPB 600 mg  Status:  Discontinued       ? 600 mg ?300 mL/hr over 60 Minutes Intravenous Every 12 hours 09/12/21 1823 09/15/21 1411  ? 09/12/21 1900  azithromycin (ZITHROMAX)  500 mg in sodium chloride 0.9 % 250 mL IVPB  Status:  Discontinued       ?See Hyperspace for full Linked Orders Report.  ? 500 mg ?250 mL/hr over 60 Minutes Intravenous Every 24 hours 09/12/21 1825 09/14/21

## 2021-09-19 NOTE — Progress Notes (Signed)
Wrong number inserted ?

## 2021-09-20 DIAGNOSIS — J9622 Acute and chronic respiratory failure with hypercapnia: Secondary | ICD-10-CM | POA: Diagnosis not present

## 2021-09-20 DIAGNOSIS — J9621 Acute and chronic respiratory failure with hypoxia: Secondary | ICD-10-CM | POA: Diagnosis not present

## 2021-09-20 LAB — CBC
HCT: 28.4 % — ABNORMAL LOW (ref 39.0–52.0)
Hemoglobin: 9 g/dL — ABNORMAL LOW (ref 13.0–17.0)
MCH: 27.1 pg (ref 26.0–34.0)
MCHC: 31.7 g/dL (ref 30.0–36.0)
MCV: 85.5 fL (ref 80.0–100.0)
Platelets: 333 10*3/uL (ref 150–400)
RBC: 3.32 MIL/uL — ABNORMAL LOW (ref 4.22–5.81)
RDW: 16.6 % — ABNORMAL HIGH (ref 11.5–15.5)
WBC: 14.4 10*3/uL — ABNORMAL HIGH (ref 4.0–10.5)
nRBC: 0 % (ref 0.0–0.2)

## 2021-09-20 LAB — BASIC METABOLIC PANEL
Anion gap: 9 (ref 5–15)
BUN: 26 mg/dL — ABNORMAL HIGH (ref 8–23)
CO2: 41 mmol/L — ABNORMAL HIGH (ref 22–32)
Calcium: 8.4 mg/dL — ABNORMAL LOW (ref 8.9–10.3)
Chloride: 82 mmol/L — ABNORMAL LOW (ref 98–111)
Creatinine, Ser: 0.59 mg/dL — ABNORMAL LOW (ref 0.61–1.24)
GFR, Estimated: >60 mL/min (ref >60)
Glucose, Bld: 123 mg/dL — ABNORMAL HIGH (ref 70–99)
Potassium: 4.7 mmol/L (ref 3.5–5.1)
Sodium: 132 mmol/L — ABNORMAL LOW (ref 135–145)

## 2021-09-20 NOTE — Progress Notes (Signed)
[facility]) Hospital Liaison Note ?  ?Per friend/Annette, patient's requested DME has arrived and she is prepared for patient to return home. However, MD/Kumar has advised for patient to remain hospitalized an additional day and d/c tomorrow. [name] and [facility] staff aware.  ?  ?Please send signed and completed DNR home with patient/family. Please provide prescriptions at discharge as needed to ensure ongoing symptom management.  ?  ?[facility] information and contact numbers given to family & above information shared with TOC. ?  ?Please call with any questions/concerns.  ?  ?Thank you for the opportunity to participate in this patient's care. ?  ?Daphene Calamity, MSW ?[place]  ?[phone] ?

## 2021-09-20 NOTE — Progress Notes (Signed)
?PROGRESS NOTE ? ? ? ?Jeremiah [name]  WLN:[id number] DOB: 05-Feb-1951 DOA: 09/12/2021 ? ?PCP: [place]  ? ?Brief Narrative:  ?This 71 yrs old male with PMH significant for End-Stage Emphysema who presented to [facility] ED on 09/12/21 from [facility] via EMS with shortness of breath.  Per ER notes EMS reported patient was in severe respiratory distress, therefore he was placed on NRB with O2 sats only increasing to 82%.  He also received duoneb, albuterol, and magnesium sulfate. He was recently hospitalized from 04/21-05/4 with acute on chronic hypoxic / hypercapnic respiratory failure secondary to severe end-stage emphysema; sepsis due to pneumonia; and right sided pleural effusion requiring right thoracentesis on 08/29/21 (body fluid culture, legionella culture reflex negative). ?  ?ED Course ?In the ED Pt. was transitioned to Bipap due to continued respiratory distress.  CXR revealed large right pleural effusion with significant interval increase and CHF. ABG: pH 7.23/pCO2 88/pO2 56/acid-base excess 6.1/bicarb 36.9.  Pt was also hypotensive and tachycardic, sepsis protocol initiated.  Pt received 2L LR bolus, ceftriaxone, duoneb x 2, and 125 mg iv solumedrol.  Due to continued hypotension levophed gtt initiated.  PCCM team contacted for ICU admission.  ? ?Patient admitted for acute on chronic hypoxic and hypercapnic respiratory failure secondary to recurrent pleural effusion due to metastatic adenocarcinoma. ?Fluid cytology confirms adenocarcinoma of the lung.  Patient has received Pleurx catheter for palliation.  Plan is to discharge home with home hospice tomorrow. ? ?Assessment & Plan: ?  ?Principal Problem: ?  Acute on chronic respiratory failure ([place]) ?Active Problems: ?  Pleural effusion, malignant ? ? ?Acute on chronic hypoxic and hypercapnic respiratory failure: ?Recurrent right pleural effusion due to metastatic adenocarcinoma and possible pneumonia: ?Patient presented with moderate  respiratory distress requiring nonrebreather and BiPAP on arrival. ?He underwent Right thoracocentesis on 09/12/21  and on 09/13/21.> 1.5 L  and 1.3 L drained. ?Continue PRN BIPAP for dyspnea and/for hypoxia  ?Continued IV and nebulized steroids ?Continue scheduled and prn bronchodilator therapy  ?Continue nicotine patch. ?Continue supplemental oxygen as required. ?- 08/29/21 pleural fluid cytology positive for METASTATIC ADENOCARCINOMA  ?- 5/8 cytology METASTATIC ADENOCARCINOMA ?- S/p IV Solu-Medrol 40 daily x3 doses given and started oral tapering prednisone ?Continue Lasix 40 mg IV twice daily for diuresis ?-5/12 CXR shows worsening of right-sided pleural effusion ?Patient decided to be DNR/DNI and hospice care, patient wants chest tube insertion for palliative comfort measures. ?-Patient underwent Pleurx catheter for palliation.  Plan is to discharge home with hospice tomorrow. ?  ?Sepsis with septic shock: ?Mildly elevated troponin secondary to demand ischemia in the setting of acute respiratory failure. ?Continue telemetry. ?s/p PRN levophed gtt to maintain map >65, off pressors now. ?Hold outpatient antihypertensives. ?   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 ?Serum osmolality 274 ?Continue fluid restriction 1.5 L/day ?Continue monitor sodium level daily ?Na 134> 132 ?  ?Hypophosphatemia  ?-Replaced and resolved. ? ?Hypomagnesemia, mag repleted.   ?Monitor and replete as needed. ?  ?Pneumonia, ?Procalcitonin normal. ?Fluid culture and blood culture negative  ?Right-sided pleural fluid cytology shows metastatic adenocarcinoma ?Completed antibiotic course for pneumonia. ?  ?  ?Chronic anemia : ?- Trend CBC ?- Transfuse for hgb <7 ?-No signs of any obvious bleeding. ?  ?GERD  ?- Continue PPI  ?  ?Severe protein calorie malnutrition  ?- Dietitian consulted appreciate input  ?  ?Vitamin D deficiency, started vitamin D 50,000 units p.o. weekly, follow with PCP to repeat  vitamin D  level after 3 months. ?  ?Body mass index is 25.28 kg/m?Marland Kitchen  ?Nutrition Problem: Severe Malnutrition ?Etiology: chronic illness (COPD, CHF) ?Interventions: ?  ?Pressure Injury 06/15/21 Coccyx Medial Stage 2 -  Partial thickness loss of dermis presenting as a shallow open injury with a red, pink wound bed without slough. pink, first layer gone (Active)  ?06/15/21 0800  ?Location: Coccyx  ?Location Orientation: Medial  ?Staging: Stage 2 -  Partial thickness loss of dermis presenting as a shallow open injury with a red, pink wound bed without slough.  ?Wound Description (Comments): pink, first layer gone  ?Present on Admission: Yes  ?   ?Pressure Injury 06/16/21 Buttocks Right Stage 2 -  Partial thickness loss of dermis presenting as a shallow open injury with a red, pink wound bed without slough. (Active)  ?06/16/21 2322  ?Location: Buttocks  ?Location Orientation: Right  ?Staging: Stage 2 -  Partial thickness loss of dermis presenting as a shallow open injury with a red, pink wound bed without slough.  ?Wound Description (Comments):   ?Present on Admission: Yes  ?  ? ? ? ?DVT prophylaxis: Lovenox ?Code Status: DNR/DNI ?Family Communication: No family at bedside. ?Disposition Plan:  ?Pt is from SNF, admitted with recurrent pleural effusion and found to have right lung cancer, still has respiratory failure requiring 6 L of supplemental oxygen , completed course of antibiotics for pneumonia .  Patient underwent Pleurx catheter by IR.  Plan is to discharge home with home services. 5/17.  ? ? ?Consultants:  ?PCCM, pulmonology, IR ? ?Procedures: Scheduled Pleurx catheter 5/16 ? ?Antimicrobials:   ?Anti-infectives (From admission, onward)  ? ? Start     Dose/Rate Route Frequency Ordered Stop  ? 09/19/21 2200  sulfamethoxazole-trimethoprim (BACTRIM) 400-80 MG per tablet 1 tablet       ? 1 tablet Oral Every 12 hours 09/19/21 1026 09/20/21 0820  ? 09/19/21 1230  vancomycin (VANCOCIN) IVPB 1000 mg/200 mL premix       ? 1,000  mg ?200 mL/hr over 60 Minutes Intravenous To Radiology 09/19/21 1140 09/20/21 1230  ? 09/19/21 1200  vancomycin (VANCOREADY) IVPB 500 mg/100 mL       ? over 60 Minutes  Continuous PRN 09/19/21 1202 09/19/21 1200  ? 09/15/21 1500  sulfamethoxazole-trimethoprim (BACTRIM) 400-80 MG per tablet 1 tablet  Status:  Discontinued       ? 1 tablet Oral Every 12 hours 09/15/21 1411 09/19/21 1026  ? 09/13/21 1900  azithromycin (ZITHROMAX) 250 mg in dextrose 5 % 125 mL IVPB  Status:  Discontinued       ?See Hyperspace for full Linked Orders Report.  ? 250 mg ?127.5 mL/hr over 60 Minutes Intravenous Every 24 hours 09/12/21 1825 09/14/21 1226  ? 09/13/21 0000  azithromycin (ZITHROMAX) 250 mg in dextrose 5 % 125 mL IVPB  Status:  Discontinued       ?See Hyperspace for full Linked Orders Report.  ? 250 mg ?127.5 mL/hr over 60 Minutes Intravenous Every 24 hours 09/12/21 1824 09/12/21 1825  ? 09/12/21 2000  ceFEPIme (MAXIPIME) 2 g in sodium chloride 0.9 % 100 mL IVPB  Status:  Discontinued       ? 2 g ?200 mL/hr over 30 Minutes Intravenous Every 8 hours 09/12/21 1914 09/15/21 1411  ? 09/12/21 1915  azithromycin (ZITHROMAX) 500 mg in sodium chloride 0.9 % 250 mL IVPB  Status:  Discontinued       ?See Hyperspace for full Linked Orders Report.  ? 500  mg ?250 mL/hr over 60 Minutes Intravenous Every 24 hours 09/12/21 1824 09/12/21 1825  ? 09/12/21 1900  linezolid (ZYVOX) IVPB 600 mg  Status:  Discontinued       ? 600 mg ?300 mL/hr over 60 Minutes Intravenous Every 12 hours 09/12/21 1823 09/15/21 1411  ? 09/12/21 1900  azithromycin (ZITHROMAX) 500 mg in sodium chloride 0.9 % 250 mL IVPB  Status:  Discontinued       ?See Hyperspace for full Linked Orders Report.  ? 500 mg ?250 mL/hr over 60 Minutes Intravenous Every 24 hours 09/12/21 1825 09/14/21 1226  ? 09/12/21 1245  cefTRIAXone (ROCEPHIN) 2 g in sodium chloride 0.9 % 100 mL IVPB       ? 2 g ?200 mL/hr over 30 Minutes Intravenous  Once 09/12/21 1236 09/12/21 1350  ? 09/12/21 1215   levofloxacin (LEVAQUIN) IVPB 750 mg  Status:  Discontinued       ? 750 mg ?100 mL/hr over 90 Minutes Intravenous  Once 09/12/21 1211 09/12/21 1236  ? ?  ?  ? ?Subjective: ?Patient was seen and examined at bedside.

## 2021-09-20 NOTE — Progress Notes (Signed)
OT Cancellation Note ? ?Patient Details ?Name: Jeremiah [name] ?MRN: [id number] ?DOB: 1950-06-09 ? ? ?Cancelled Treatment:    Reason Eval/Treat Not Completed: Patient declined, no reason specified. Pt politely declined OT intervention and reports not feeling well and requesting to rest at this time. OT to re-attempt at next available time.  ? ?Darleen Crocker, [place], OTR/L , CBIS ?ascom [phone]  ?09/20/21, 2:32 PM  ?

## 2021-09-20 NOTE — Progress Notes (Signed)
Nutrition Follow Up Note  ? ?DOCUMENTATION CODES:  ? ?Severe malnutrition in context of chronic illness ? ?INTERVENTION:  ? ?Ensure Enlive po TID, each supplement provides 350 kcal and 20 grams of protein. ? ?Magic cup TID, each supplement provides 290 kcal and 9 grams of protein ? ?MVI po daily  ? ?Vitamin C 500mg  po BID  ? ?Zing 220mg  po daily x 14 days  ? ?Pt at high refeed risk; recommend monitor potassium, magnesium and phosphorus labs daily until stable ? ?NUTRITION DIAGNOSIS:  ? ?Severe Malnutrition related to chronic illness (COPD, CHF) as evidenced by moderate fat depletion, severe muscle depletion. ? ?GOAL:  ? ?Patient will meet greater than or equal to 90% of their needs ?-progressing  ? ?MONITOR:  ? ?PO intake, Supplement acceptance, Labs, Weight trends, Skin, I & O's ? ?ASSESSMENT:  ? ?71 y/o male with h/o CHF, COPD, HTN, GERD, depression, pulmonary nodule and malnutrition who is admitted with recurrent right pleural effusion secondary to metastatic adenocarcinoma and pneumonia: ? ?Pt s/p thoracentesis 5/8 with 1.5L output  ?Pt s/p thoracentesis 5/9 with 1.3L output ?Pt s/p PleurX catheter 5/15 with 1.2L output  ? ?Pt noted to have new metastatic adenocarcinoma noted on cytology. Pt has elected for comfort measures and hospice care. PleurX placed yesterday for comfort. Per chart review, pt's oral intake is improved; pt documented to be eating 50-100% of meals over the past several days. Pt ate 90% of his breakfast this morning. Pt is drinking some Ensure and receiving Magic Cups on meal trays. RD will continue supplements for pt's pleasure. Pt continues to be at refeed risk. Plan is for discharge home with hospice.  ? ?Medications reviewed and include: vitamin C, lovenox, lasix, melatonin, MVI, nicotine, protonix, vitamin D, zinc   ? ?Labs reviewed: Na 132(L), K 4.7 wnl, BUN 26(H), creat 0.59(L) ?P 2.6 wnl, Mg 2.0 wnl ?Wbc- 14.4(H), Hgb 9.0(L), Hct 28.4(L) ? ?Diet Order:   ?Diet Order   ? ?       ?   DIET DYS 3 Room service appropriate? Yes; Fluid consistency: Thin  Diet effective now       ?  ? ?  ?  ? ?  ? ?EDUCATION NEEDS:  ? ?Education needs have been addressed ? ?Skin:  Skin Assessment: Skin Integrity Issues: ?Skin Integrity Issues:: Other (Comment) ?Other: open wound to rt heel ? ?Last BM:  5/12- type 4 ? ?Height:  ? ?Ht Readings from Last 1 Encounters:  ?09/12/21 5\' 7"  (1.702 m)  ? ? ?Weight:  ? ?Wt Readings from Last 1 Encounters:  ?09/20/21 66.5 kg  ? ? ?Ideal Body Weight:  67.2 kg ? ?BMI:  Body mass index is 22.96 kg/m?. ? ?Estimated Nutritional Needs:  ? ?Kcal:  1900-2200kcal/day ? ?Protein:  95-110g/day ? ?Fluid:  1.6-1.9L/day ? ?Koleen Distance MS, RD, LDN ?Please refer to AMION for RD and/or RD on-call/weekend/after hours pager ? ?

## 2021-09-21 DIAGNOSIS — R0603 Acute respiratory distress: Secondary | ICD-10-CM | POA: Diagnosis not present

## 2021-09-21 DIAGNOSIS — J9620 Acute and chronic respiratory failure, unspecified whether with hypoxia or hypercapnia: Secondary | ICD-10-CM

## 2021-09-21 DIAGNOSIS — J9621 Acute and chronic respiratory failure with hypoxia: Secondary | ICD-10-CM | POA: Diagnosis not present

## 2021-09-21 DIAGNOSIS — J90 Pleural effusion, not elsewhere classified: Secondary | ICD-10-CM | POA: Diagnosis not present

## 2021-09-21 DIAGNOSIS — J910 Malignant pleural effusion: Secondary | ICD-10-CM | POA: Diagnosis not present

## 2021-09-21 LAB — BASIC METABOLIC PANEL
Anion gap: 9 (ref 5–15)
BUN: 28 mg/dL — ABNORMAL HIGH (ref 8–23)
CO2: 41 mmol/L — ABNORMAL HIGH (ref 22–32)
Calcium: 8.4 mg/dL — ABNORMAL LOW (ref 8.9–10.3)
Chloride: 81 mmol/L — ABNORMAL LOW (ref 98–111)
Creatinine, Ser: 0.7 mg/dL (ref 0.61–1.24)
GFR, Estimated: >60 mL/min (ref >60)
Glucose, Bld: 117 mg/dL — ABNORMAL HIGH (ref 70–99)
Potassium: 4.5 mmol/L (ref 3.5–5.1)
Sodium: 131 mmol/L — ABNORMAL LOW (ref 135–145)

## 2021-09-21 LAB — CBC
HCT: 27.3 % — ABNORMAL LOW (ref 39.0–52.0)
Hemoglobin: 8.9 g/dL — ABNORMAL LOW (ref 13.0–17.0)
MCH: 27.9 pg (ref 26.0–34.0)
MCHC: 32.6 g/dL (ref 30.0–36.0)
MCV: 85.6 fL (ref 80.0–100.0)
Platelets: 317 10*3/uL (ref 150–400)
RBC: 3.19 MIL/uL — ABNORMAL LOW (ref 4.22–5.81)
RDW: 16.5 % — ABNORMAL HIGH (ref 11.5–15.5)
WBC: 14.2 10*3/uL — ABNORMAL HIGH (ref 4.0–10.5)
nRBC: 0 % (ref 0.0–0.2)

## 2021-09-21 LAB — MAGNESIUM: Magnesium: 1.8 mg/dL (ref 1.7–2.4)

## 2021-09-21 LAB — PHOSPHORUS: Phosphorus: 3 mg/dL (ref 2.5–4.6)

## 2021-09-21 MED ORDER — MORPHINE SULFATE (CONCENTRATE) 20 MG/ML PO SOLN: 10.0000 mg | ORAL | 0 refills | Status: AC | PRN

## 2021-09-21 MED ORDER — VITAMIN D (ERGOCALCIFEROL) 1.25 MG (50000 UNIT) PO CAPS: 50000.0000 [IU] | ORAL_CAPSULE | ORAL | 0 refills | Status: AC

## 2021-09-21 MED ORDER — DOCUSATE SODIUM 100 MG PO CAPS
100.0000 mg | ORAL_CAPSULE | Freq: Two times a day (BID) | ORAL | 0 refills | Status: AC | PRN
Start: 2021-09-21 — End: ?

## 2021-09-21 MED ORDER — FUROSEMIDE 20 MG PO TABS: 40.0000 mg | ORAL_TABLET | Freq: Every day | ORAL | 1 refills | Status: AC

## 2021-09-21 MED ORDER — HYDROXYZINE HCL 25 MG PO TABS: 25.0000 mg | ORAL_TABLET | Freq: Four times a day (QID) | ORAL | 0 refills | Status: AC | PRN

## 2021-09-21 MED ORDER — MELATONIN 5 MG PO TABS: 5.0000 mg | ORAL_TABLET | Freq: Every day | ORAL | 0 refills | Status: AC

## 2021-09-21 MED ORDER — ENSURE ENLIVE PO LIQD: 237.0000 mL | Freq: Three times a day (TID) | ORAL | 12 refills | Status: AC

## 2021-09-21 MED ORDER — POLYETHYLENE GLYCOL 3350 17 G PO PACK: 17.0000 g | PACK | Freq: Every day | ORAL | 0 refills | Status: AC | PRN

## 2021-09-21 MED ORDER — HYDROCODONE BIT-HOMATROP MBR 5-1.5 MG/5ML PO SOLN: 5.0000 mL | Freq: Four times a day (QID) | ORAL | 0 refills | Status: AC | PRN

## 2021-09-21 MED ORDER — PROCHLORPERAZINE MALEATE 10 MG PO TABS: 10.0000 mg | ORAL_TABLET | ORAL | 0 refills | Status: AC | PRN

## 2021-09-21 NOTE — TOC Transition Note (Signed)
Transition of Care (TOC) - CM/SW Discharge Note ? ? ?Patient Details  ?Name: Jeremiah [name] ?MRN: [id number] ?Date of Birth: 06-19-1950 ? ?Transition of Care (TOC) CM/SW Contact:  ?Beverly Sessions, RN ?Phone Number: ?09/21/2021, 1:22 PM ? ? ?Clinical Narrative:    ? ? ?Patient to discharge home with hospice today ?Louanne Skye at bedside  ?Per Linda ?,  hospice nurse is scheduled for a home visit at [place] today.  All DME has been delivered.  ? ?DNR and EMS packet on chart.  EMS transport called  ?  ?Barriers to Discharge: Continued Medical Work up ? ? ?Patient Goals and CMS Choice ?Patient states their goals for this hospitalization and ongoing recovery are:: to go home ?CMS Medicare.gov Compare Post Acute Care list provided to:: Patient Represenative (must comment) (friend) ?  ? ?Discharge Placement ?  ?           ?  ?  ?  ?  ? ?Discharge Plan and Services ?  ?  ?           ?  ?  ?  ?  ?  ?  ?  ?  ?  ?  ? ?Social Determinants of Health (SDOH) Interventions ?  ? ? ?Readmission Risk Interventions ? ?  08/29/2021  ?  2:00 PM 10/21/2020  ?  1:14 PM 09/27/2020  ? 11:18 AM  ?Readmission Risk Prevention Plan  ?Transportation Screening Complete Complete Complete  ?PCP or Specialist Appt within 3-5 Days  Complete Complete  ?[place] or Home Care Consult  Complete Complete  ?Social Work Consult for [place] Planning/Counseling  Complete Not Complete  ?SW consult not completed comments   RNCM assigned to patient  ?Palliative Care Screening  Not Applicable Not Applicable  ?Medication Review Press photographer) Complete Complete Complete  ?PCP or Specialist appointment within 3-5 days of discharge Complete    ?[place] or Home Care Consult Complete    ?SW Recovery Care/Counseling Consult Complete    ?Palliative Care Screening Not Applicable    ?[place] Not Complete    ?SNF Comments waiting on PT recomendation    ? ? ? ? ? ?

## 2021-09-21 NOTE — Progress Notes (Signed)
EMS is here to transport the patient home. IV removed. Instructions reviewed with friend, [name].  ?

## 2021-09-21 NOTE — Progress Notes (Signed)
[facility])  ?  ?Please send signed and completed DNR with patient/family upon discharge. Please provide prescriptions at discharge as needed to ensure ongoing symptom management and a transport packet. ?  ?[facility] information and contact numbers given to family and above information shared with TOC.  ?  ?Please call with any questions/concerns.  ?  ?Thank you for the opportunity to participate in this patient's care ?  ?Daphene Calamity, MSW ?[name]  ?[phone] ? ?

## 2021-09-21 NOTE — Discharge Summary (Signed)
?Physician Discharge Summary ?  ?Patient: Jeremiah [name] MRN: [id number] DOB: [birth year]  ?Admit date:     09/12/2021  ?Discharge date: 09/21/21  ?Discharge Physician: Lorella Nimrod  ? ?PCP: [place]  ? ?Recommendations at discharge:  ?Follow-up with hospice services. ? ?Discharge Diagnoses: ?Principal Problem: ?  Acute on chronic respiratory failure ([place]) ?Active Problems: ?  Pleural effusion, malignant ? ?Hospital Course: ?This 71 yrs old male with PMH significant for End-Stage Emphysema who presented to [facility] ED on 09/12/21 from [facility] via EMS with shortness of breath.  Per ER notes EMS reported patient was in severe respiratory distress, therefore he was placed on NRB with O2 sats only increasing to 82%.  He also received duoneb, albuterol, and magnesium sulfate. He was recently hospitalized from 04/21-05/4 with acute on chronic hypoxic / hypercapnic respiratory failure secondary to severe end-stage emphysema; sepsis due to pneumonia; and right sided pleural effusion requiring right thoracentesis on 08/29/21 (body fluid culture, legionella culture reflex negative).  Cytology was positive for malignant cells with concern of metastatic adenocarcinoma. ? ?Patient was readmitted with worsening shortness of breath, initially requiring BiPAP.  Recurrence of malignant pleural effusion s/p pleural catheter in place for comfort reasons.  On presentation there was a concern of sepsis with septic shock as he required pressors.  Later able to wean off from pressors.  Blood pressure within normal limit and home dose of lisinopril was discontinued.  All cultures remain negative.  Procalcitonin negative.  Completed a course of antibiotics for concern of pneumonia. ?Sepsis ruled out as there is no obvious source of infection. ? ?Oncology, pulmonology and palliative care was on board.  He was evaluated by oncology with recommendations of palliative care.  Because of his advanced COPD and respiratory  status along with poor performance status he was not a candidate for any chemotherapy. ? ?Patient later decided to proceed with hospice services.  And is being discharged home with hospice.  He was also interested for hospice home where he can later transitioned if needed. ? 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 ? ?He was also found to have vitamin D deficiency and started on high-dose vitamin D supplement. ? ?He will continue with his current medications and hospice will help with Pleurx catheter. ?  ?  ? ?Pain control - [employer] Controlled Substance Reporting System database was reviewed. and patient was instructed, not to drive, operate heavy machinery, perform activities at heights, swimming or participation in water activities or provide baby-sitting services while on Pain, Sleep and Anxiety Medications; until their outpatient Physician has advised to do so again. Also recommended to not to take more than prescribed Pain, Sleep and Anxiety Medications.  ?Consultants: PCCM, palliative care, oncology ?Procedures performed: Permanent Pleurx catheter placement ?Disposition: Hospice care ?Diet recommendation:  ?Regular diet ?DISCHARGE MEDICATION: ?Allergies as of 09/21/2021   ? ?   Reactions  ? Penicillins Anaphylaxis, Swelling, Other (See Comments)  ? Tolerated Ceftriaxone 06/2020 ?05/2020 childhood reaction and his mother told him that he "swelled up." ?Had a PCN reaction causing immediate rash, facial/tongue/throat swelling, SOB or lightheadedness with hypotension: Yes ?Had a PCN reaction causing severe rash involving mucus membranes or skin necrosis: No ?Had a PCN reaction that required hospitalization No ?Had a PCN reaction occurring within the last 10 years: No ?If all the above answers are "NO", may proceed with Cephalosporin use.  ? ?  ? ?  ?Medication List  ?  ? ?  STOP taking these medications   ? ?Incruse Ellipta 62.5 MCG/ACT Aepb ?Generic drug:  umeclidinium bromide ?  ?lisinopril 5 MG tablet ?Commonly known as: ZESTRIL ?  ?predniSONE 20 MG tablet ?Commonly known as: DELTASONE ?  ?QUEtiapine 50 MG tablet ?Commonly known as: SEROQUEL ?  ? ?  ? ?TAKE these medications   ? ?acetaminophen 325 MG tablet ?Commonly known as: TYLENOL ?Take 2 tablets (650 mg total) by mouth every 6 (six) hours as needed for mild pain (or Fever >/= 101). ?  ?albuterol 108 (90 Base) MCG/ACT inhaler ?Commonly known as: VENTOLIN HFA ?Inhale into the lungs every 6 (six) hours as needed for wheezing or shortness of breath. ?  ?atorvastatin 40 MG tablet ?Commonly known as: LIPITOR ?Take 40 mg by mouth daily. ?  ?Breo Ellipta 100-25 MCG/ACT Aepb ?Generic drug: fluticasone furoate-vilanterol ?Inhale 1 puff into the lungs daily. ?  ?docusate sodium 100 MG capsule ?Commonly known as: COLACE ?Take 1 capsule (100 mg total) by mouth 2 (two) times daily as needed for mild constipation. ?  ?DULoxetine 30 MG capsule ?Commonly known as: CYMBALTA ?Take 30 mg by mouth daily. ?  ?feeding supplement Liqd ?Take 237 mLs by mouth 3 (three) times daily between meals. ?  ?ferrous sulfate 325 (65 FE) MG tablet ?Take 1 tablet (325 mg total) by mouth daily. ?  ?furosemide 20 MG tablet ?Commonly known as: Lasix ?Take 2 tablets (40 mg total) by mouth daily. ?What changed:  ?how much to take ?when to take this ?reasons to take this ?  ?guaiFENesin-dextromethorphan 100-10 MG/5ML syrup ?Commonly known as: ROBITUSSIN DM ?Take 5 mLs by mouth every 6 (six) hours as needed for cough. ?  ?HYDROcodone bit-homatropine 5-1.5 MG/5ML syrup ?Commonly known as: HYCODAN ?Take 5 mLs by mouth every 6 (six) hours as needed for cough. ?  ?hydrOXYzine 25 MG tablet ?Commonly known as: ATARAX ?Take 1 tablet (25 mg total) by mouth every 6 (six) hours as needed for anxiety. ?  ?ipratropium-albuterol 0.5-2.5 (3) MG/3ML Soln ?Commonly known as: DUONEB ?Take 3 mLs by nebulization every 6 (six) hours as needed. ?  ?melatonin 5 MG Tabs ?Take 1  tablet (5 mg total) by mouth at bedtime. ?  ?montelukast 10 MG tablet ?Commonly known as: SINGULAIR ?Take 1 tablet (10 mg total) by mouth at bedtime. ?  ?morphine 20 MG/ML concentrated solution ?Commonly known as: ROXANOL ?Take 0.5 mLs (10 mg total) by mouth every 4 (four) hours as needed for severe pain, shortness of breath or moderate pain. May give sublingually if needed. ?  ?multivitamin with minerals Tabs tablet ?Take 1 tablet by mouth daily. ?  ?mupirocin ointment 2 % ?Commonly known as: BACTROBAN ?Apply 1 application topically 2 (two) times daily. ?  ?nicotine 21 mg/24hr patch ?Commonly known as: NICODERM CQ - dosed in mg/24 hours ?Place 1 patch (21 mg total) onto the skin daily. ?  ?pantoprazole 20 MG tablet ?Commonly known as: PROTONIX ?Take 1 tablet (20 mg total) by mouth daily. ?  ?polyethylene glycol 17 g packet ?Commonly known as: MIRALAX / GLYCOLAX ?Take 17 g by mouth daily as needed for moderate constipation. ?  ?prochlorperazine 10 MG tablet ?Commonly known as: COMPAZINE ?Take 1 tablet (10 mg total) by mouth every 4 (four) hours as needed for nausea or vomiting. May crush, mix with water and give sublingually. ?  ?tamsulosin 0.4 MG Caps capsule ?Commonly known as: FLOMAX ?Take 1 capsule (0.4 mg total) by mouth daily. ?  ?tiotropium 18 MCG inhalation capsule ?Commonly known  as: Wade ?Place 18 mcg into inhaler and inhale daily. ?  ?Vitamin D (Ergocalciferol) 1.25 MG (50000 UNIT) Caps capsule ?Commonly known as: DRISDOL ?Take 1 capsule (50,000 Units total) by mouth every 7 (seven) days. ?Start taking on: Sep 22, 2021 ?  ? ?  ? ?  ?  ? ? ?  ?Discharge Care Instructions  ?(From admission, onward)  ?  ? ? ?  ? ?  Start     Ordered  ? 09/21/21 0000  No dressing needed       ? 09/21/21 1306  ? 09/21/21 0000  Discharge wound care:       ?Comments: Apply gel dressing at pressure areas, change as needed. ?Frequent position change  ? 09/21/21 1306  ? ?  ?  ? ?  ? ? Follow-up Information   ? ? [facility] Schedule an appointment as soon as possible for a visit in 1 week(s).   ?Contact information: ?[facility] ?[place] [address] ?[phone] ? ? ?  ?  ? ?  ?  ? ?  ? ?Discharge Exam

## 2021-10-06 DEATH — deceased
